# Patient Record
Sex: Female | Born: 1955 | Race: Black or African American | Hispanic: No | Smoking: Former smoker
Health system: Southern US, Community
[De-identification: ages and names within clinical notes are randomized; demographics above are authoritative.]

## PROBLEM LIST (undated history)

## (undated) DIAGNOSIS — H269 Unspecified cataract: Secondary | ICD-10-CM

## (undated) DIAGNOSIS — R011 Cardiac murmur, unspecified: Secondary | ICD-10-CM

## (undated) DIAGNOSIS — F419 Anxiety disorder, unspecified: Secondary | ICD-10-CM

## (undated) DIAGNOSIS — M1990 Unspecified osteoarthritis, unspecified site: Secondary | ICD-10-CM

## (undated) DIAGNOSIS — I10 Essential (primary) hypertension: Secondary | ICD-10-CM

## (undated) DIAGNOSIS — E119 Type 2 diabetes mellitus without complications: Secondary | ICD-10-CM

## (undated) DIAGNOSIS — D649 Anemia, unspecified: Secondary | ICD-10-CM

## (undated) DIAGNOSIS — I509 Heart failure, unspecified: Secondary | ICD-10-CM

## (undated) DIAGNOSIS — J189 Pneumonia, unspecified organism: Secondary | ICD-10-CM

## (undated) DIAGNOSIS — C50919 Malignant neoplasm of unspecified site of unspecified female breast: Secondary | ICD-10-CM

## (undated) HISTORY — DX: Anemia, unspecified: D64.9

## (undated) HISTORY — DX: Unspecified cataract: H26.9

## (undated) HISTORY — DX: Cardiac murmur, unspecified: R01.1

## (undated) HISTORY — DX: Anxiety disorder, unspecified: F41.9

## (undated) HISTORY — DX: Heart failure, unspecified: I50.9

## (undated) HISTORY — DX: Unspecified osteoarthritis, unspecified site: M19.90

---

## 2011-12-22 DIAGNOSIS — M25561 Pain in right knee: Secondary | ICD-10-CM | POA: Insufficient documentation

## 2011-12-22 DIAGNOSIS — J029 Acute pharyngitis, unspecified: Secondary | ICD-10-CM | POA: Insufficient documentation

## 2011-12-22 DIAGNOSIS — H6980 Other specified disorders of Eustachian tube, unspecified ear: Secondary | ICD-10-CM | POA: Insufficient documentation

## 2011-12-22 DIAGNOSIS — R42 Dizziness and giddiness: Secondary | ICD-10-CM | POA: Insufficient documentation

## 2011-12-22 DIAGNOSIS — H9209 Otalgia, unspecified ear: Secondary | ICD-10-CM | POA: Insufficient documentation

## 2011-12-22 DIAGNOSIS — J329 Chronic sinusitis, unspecified: Secondary | ICD-10-CM | POA: Insufficient documentation

## 2011-12-22 DIAGNOSIS — H6990 Unspecified Eustachian tube disorder, unspecified ear: Secondary | ICD-10-CM | POA: Insufficient documentation

## 2018-01-28 DIAGNOSIS — M1990 Unspecified osteoarthritis, unspecified site: Secondary | ICD-10-CM | POA: Insufficient documentation

## 2018-01-28 DIAGNOSIS — I10 Essential (primary) hypertension: Secondary | ICD-10-CM | POA: Insufficient documentation

## 2018-03-25 DIAGNOSIS — M1710 Unilateral primary osteoarthritis, unspecified knee: Secondary | ICD-10-CM | POA: Insufficient documentation

## 2018-03-25 DIAGNOSIS — M179 Osteoarthritis of knee, unspecified: Secondary | ICD-10-CM | POA: Insufficient documentation

## 2018-03-25 DIAGNOSIS — R262 Difficulty in walking, not elsewhere classified: Secondary | ICD-10-CM | POA: Insufficient documentation

## 2018-07-18 DIAGNOSIS — D369 Benign neoplasm, unspecified site: Secondary | ICD-10-CM | POA: Insufficient documentation

## 2020-01-30 DIAGNOSIS — R5381 Other malaise: Secondary | ICD-10-CM | POA: Insufficient documentation

## 2020-02-29 DIAGNOSIS — D539 Nutritional anemia, unspecified: Secondary | ICD-10-CM | POA: Insufficient documentation

## 2020-03-12 DIAGNOSIS — R590 Localized enlarged lymph nodes: Secondary | ICD-10-CM | POA: Insufficient documentation

## 2020-03-14 DIAGNOSIS — A419 Sepsis, unspecified organism: Secondary | ICD-10-CM | POA: Insufficient documentation

## 2020-03-18 DIAGNOSIS — Z171 Estrogen receptor negative status [ER-]: Secondary | ICD-10-CM | POA: Insufficient documentation

## 2020-03-18 DIAGNOSIS — C50411 Malignant neoplasm of upper-outer quadrant of right female breast: Secondary | ICD-10-CM | POA: Insufficient documentation

## 2020-04-03 DIAGNOSIS — C50919 Malignant neoplasm of unspecified site of unspecified female breast: Secondary | ICD-10-CM | POA: Insufficient documentation

## 2020-07-19 DIAGNOSIS — J189 Pneumonia, unspecified organism: Secondary | ICD-10-CM | POA: Insufficient documentation

## 2020-07-31 DIAGNOSIS — D649 Anemia, unspecified: Secondary | ICD-10-CM | POA: Insufficient documentation

## 2020-07-31 DIAGNOSIS — E119 Type 2 diabetes mellitus without complications: Secondary | ICD-10-CM | POA: Insufficient documentation

## 2020-07-31 DIAGNOSIS — D638 Anemia in other chronic diseases classified elsewhere: Secondary | ICD-10-CM | POA: Insufficient documentation

## 2020-07-31 DIAGNOSIS — K219 Gastro-esophageal reflux disease without esophagitis: Secondary | ICD-10-CM | POA: Insufficient documentation

## 2020-08-08 DIAGNOSIS — L98491 Non-pressure chronic ulcer of skin of other sites limited to breakdown of skin: Secondary | ICD-10-CM | POA: Insufficient documentation

## 2020-08-16 DIAGNOSIS — E43 Unspecified severe protein-calorie malnutrition: Secondary | ICD-10-CM | POA: Insufficient documentation

## 2020-09-16 ENCOUNTER — Emergency Department (HOSPITAL_COMMUNITY)
Admission: EM | Admit: 2020-09-16 | Discharge: 2020-09-17 | Disposition: A | Discharge status: Home / Self Care | Payer: Self-pay | Attending: Emergency Medicine | Admitting: Emergency Medicine

## 2020-09-16 ENCOUNTER — Other Ambulatory Visit: Payer: Self-pay

## 2020-09-16 ENCOUNTER — Emergency Department (HOSPITAL_COMMUNITY): Payer: Self-pay

## 2020-09-16 ENCOUNTER — Encounter (HOSPITAL_COMMUNITY): Payer: Self-pay

## 2020-09-16 DIAGNOSIS — Z79899 Other long term (current) drug therapy: Secondary | ICD-10-CM | POA: Insufficient documentation

## 2020-09-16 DIAGNOSIS — E119 Type 2 diabetes mellitus without complications: Secondary | ICD-10-CM | POA: Insufficient documentation

## 2020-09-16 DIAGNOSIS — R0602 Shortness of breath: Secondary | ICD-10-CM | POA: Insufficient documentation

## 2020-09-16 DIAGNOSIS — I10 Essential (primary) hypertension: Secondary | ICD-10-CM | POA: Insufficient documentation

## 2020-09-16 DIAGNOSIS — Z853 Personal history of malignant neoplasm of breast: Secondary | ICD-10-CM | POA: Insufficient documentation

## 2020-09-16 LAB — CBC WITH DIFFERENTIAL/PLATELET
Abs Immature Granulocytes: 0.12 10*3/uL — ABNORMAL HIGH (ref 0.00–0.07)
Basophils Absolute: 0 10*3/uL (ref 0.0–0.1)
Basophils Relative: 0 %
Eosinophils Absolute: 0 10*3/uL (ref 0.0–0.5)
Eosinophils Relative: 0 %
HCT: 35.7 % — ABNORMAL LOW (ref 36.0–46.0)
Hemoglobin: 10.9 g/dL — ABNORMAL LOW (ref 12.0–15.0)
Immature Granulocytes: 2 %
Lymphocytes Relative: 30 %
Lymphs Abs: 1.6 10*3/uL (ref 0.7–4.0)
MCH: 29.7 pg (ref 26.0–34.0)
MCHC: 30.5 g/dL (ref 30.0–36.0)
MCV: 97.3 fL (ref 80.0–100.0)
Monocytes Absolute: 0.5 10*3/uL (ref 0.1–1.0)
Monocytes Relative: 8 %
Neutro Abs: 3.2 10*3/uL (ref 1.7–7.7)
Neutrophils Relative %: 60 %
Platelets: 243 10*3/uL (ref 150–400)
RBC: 3.67 MIL/uL — ABNORMAL LOW (ref 3.87–5.11)
RDW: 17.2 % — ABNORMAL HIGH (ref 11.5–15.5)
WBC: 5.4 10*3/uL (ref 4.0–10.5)
nRBC: 0 % (ref 0.0–0.2)

## 2020-09-16 LAB — COMPREHENSIVE METABOLIC PANEL
ALT: 59 U/L — ABNORMAL HIGH (ref 0–44)
AST: 26 U/L (ref 15–41)
Albumin: 3.3 g/dL — ABNORMAL LOW (ref 3.5–5.0)
Alkaline Phosphatase: 94 U/L (ref 38–126)
Anion gap: 10 (ref 5–15)
BUN: 7 mg/dL — ABNORMAL LOW (ref 8–23)
CO2: 29 mmol/L (ref 22–32)
Calcium: 8.9 mg/dL (ref 8.9–10.3)
Chloride: 102 mmol/L (ref 98–111)
Creatinine, Ser: 0.6 mg/dL (ref 0.44–1.00)
GFR, Estimated: >60 mL/min (ref >60)
Glucose, Bld: 192 mg/dL — ABNORMAL HIGH (ref 70–99)
Potassium: 3 mmol/L — ABNORMAL LOW (ref 3.5–5.1)
Sodium: 141 mmol/L (ref 135–145)
Total Bilirubin: 0.6 mg/dL (ref 0.3–1.2)
Total Protein: 6.4 g/dL — ABNORMAL LOW (ref 6.5–8.1)

## 2020-09-16 LAB — TROPONIN I (HIGH SENSITIVITY): Troponin I (High Sensitivity): 10 ng/L (ref <18)

## 2020-09-16 LAB — BRAIN NATRIURETIC PEPTIDE: B Natriuretic Peptide: 11.4 pg/mL (ref 0.0–100.0)

## 2020-09-16 IMAGING — DX DG CHEST 2V
2 series · 2 of 2 positions shown · non-contrast
Comparison: None.

CLINICAL DATA: Dyspnea, lightheaded, hypertension

EXAM:
CHEST - 2 VIEW

[chest lat]
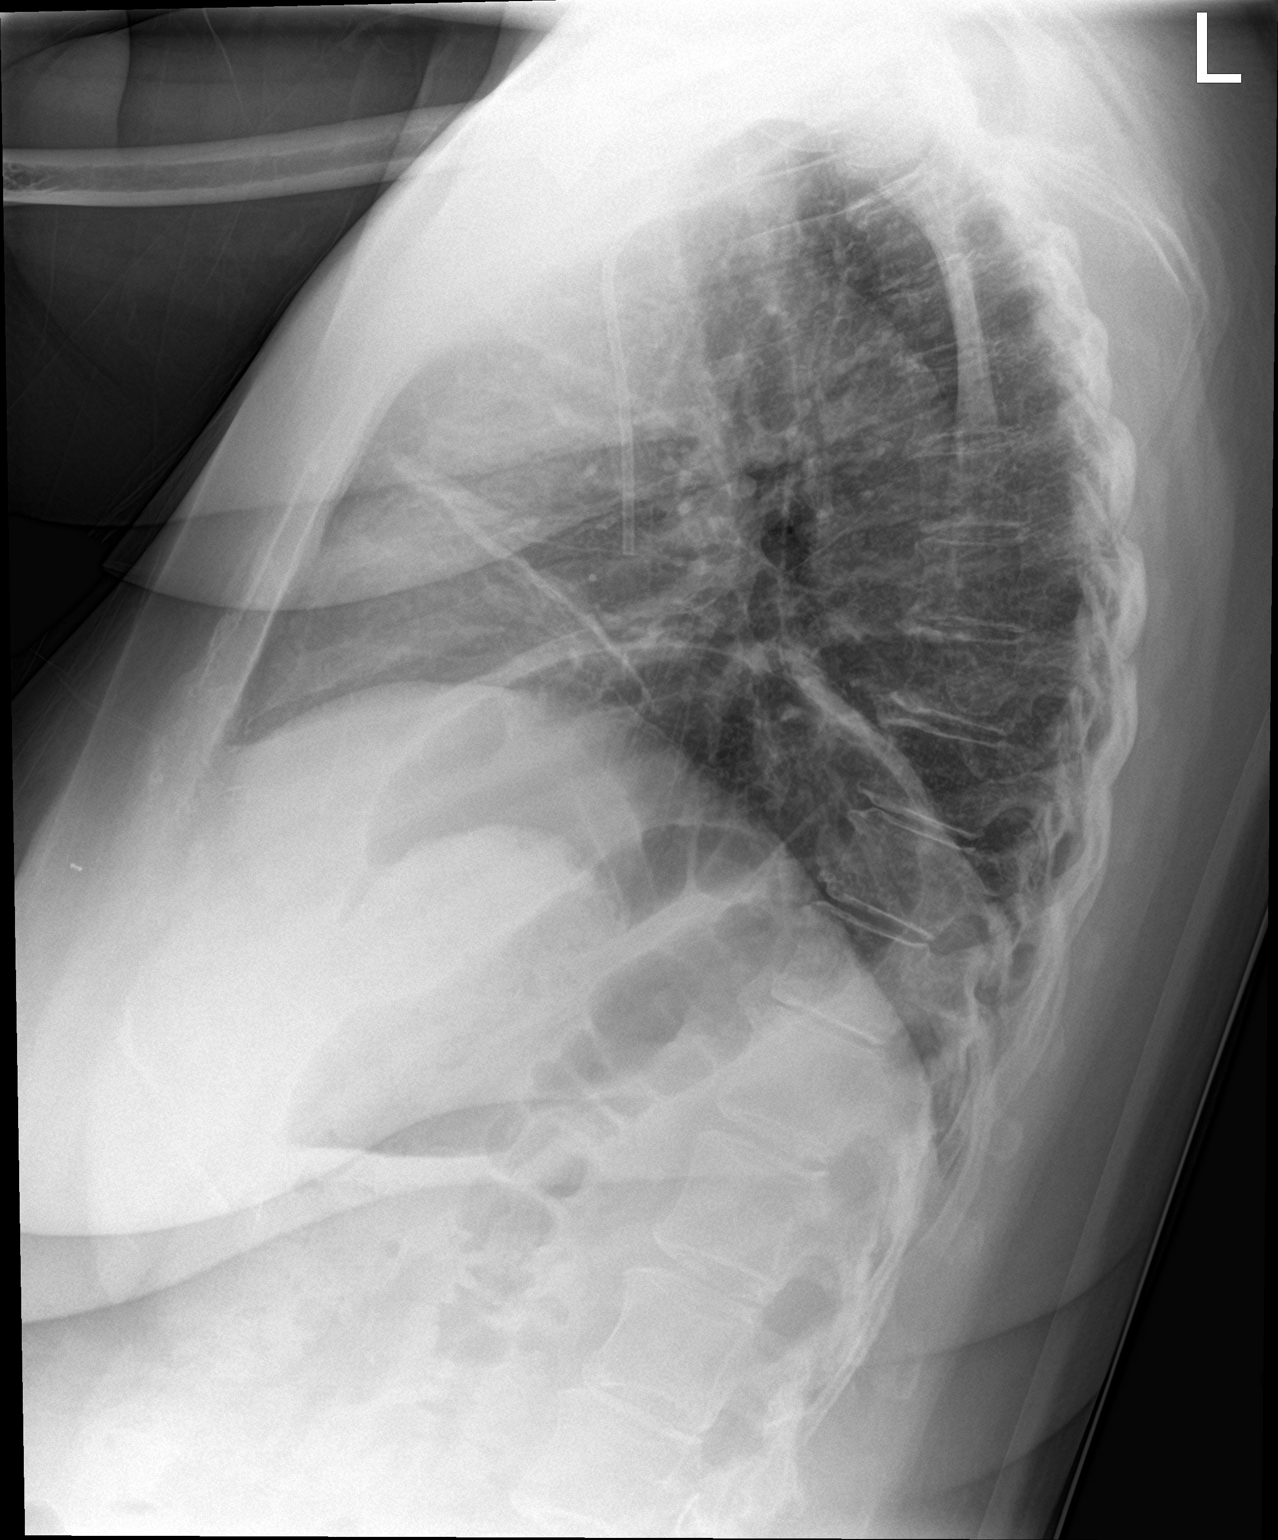

[chest ap]
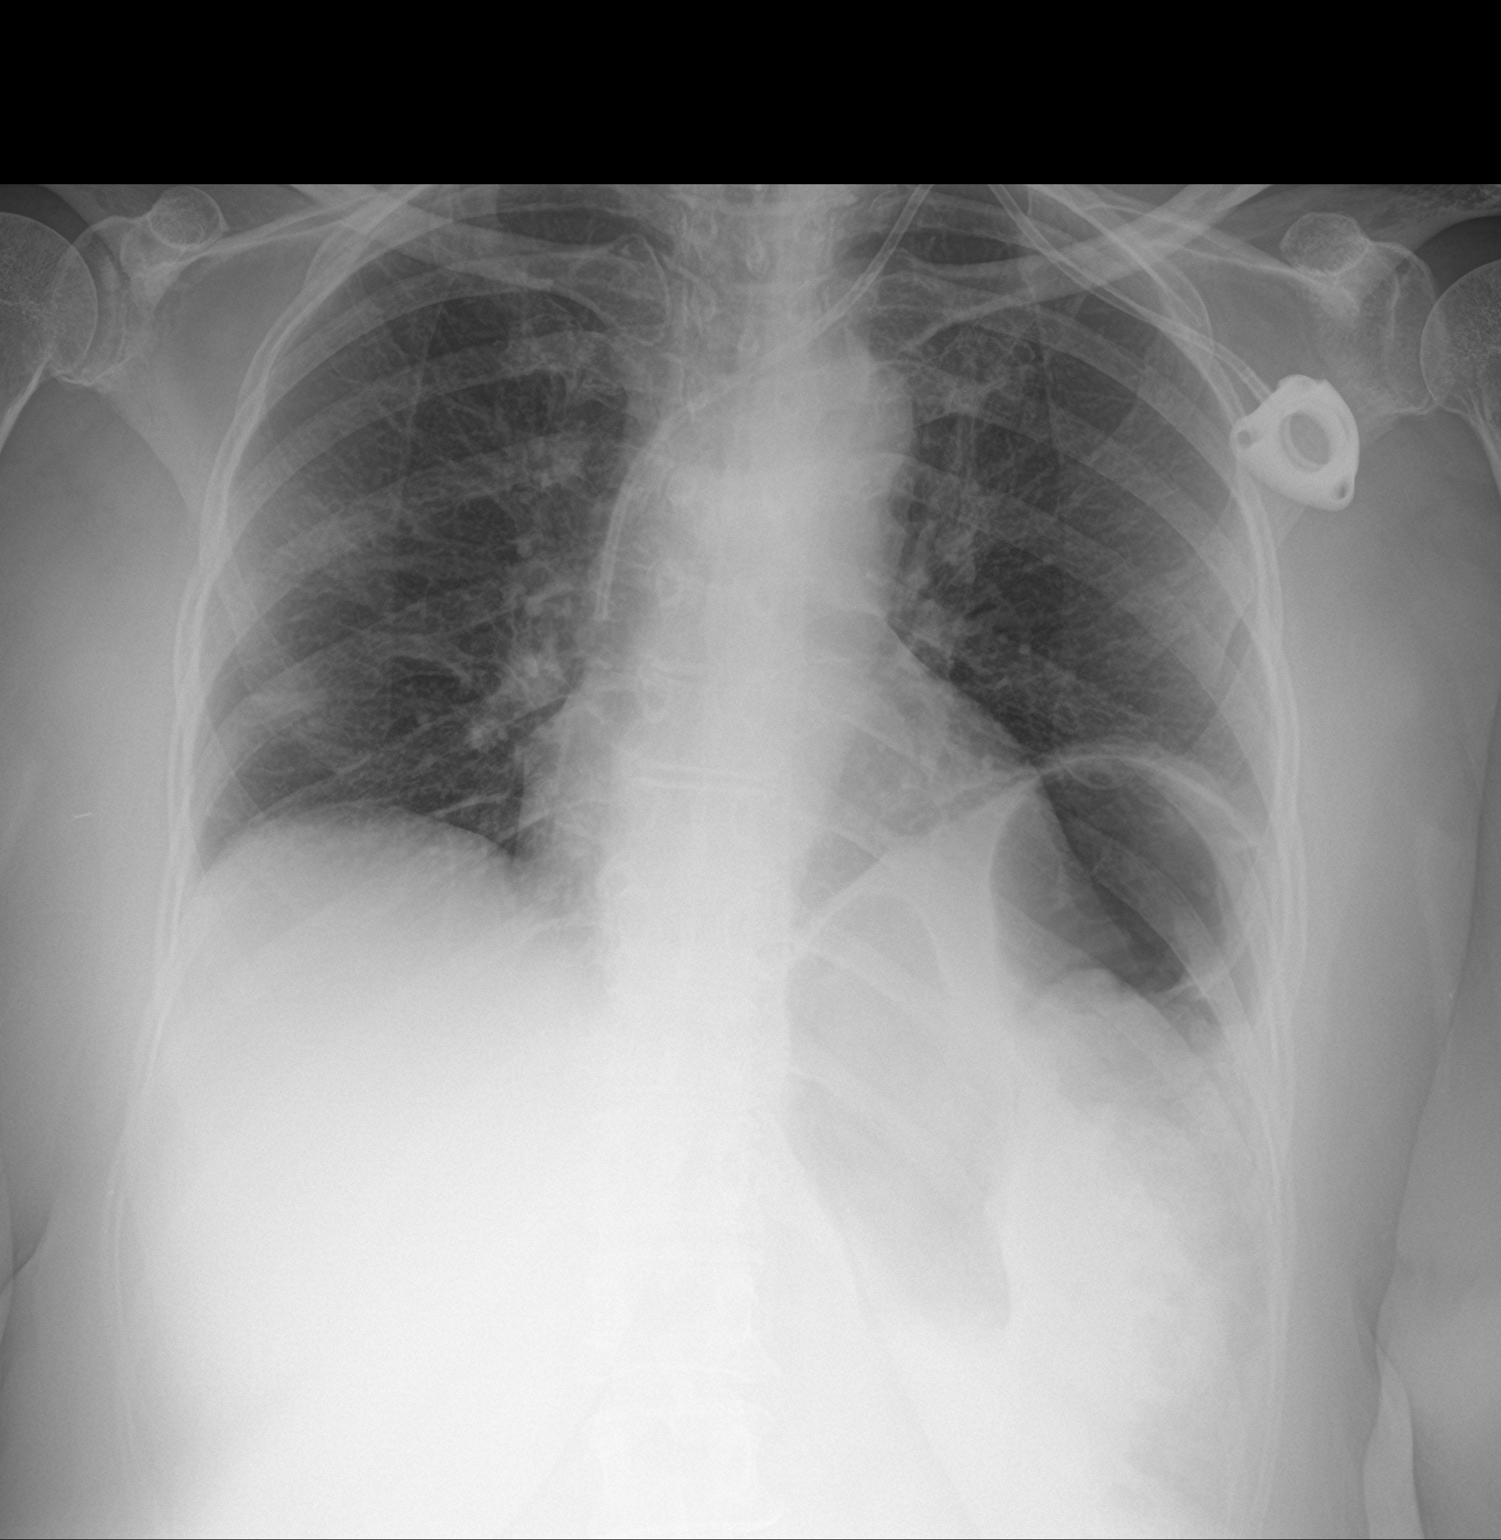

[2 of 2 positions shown; findings below may reference images not displayed]

FINDINGS: Left internal jugular Port-A-Cath terminates in the lower third of
the SVC. Normal heart size. Normal mediastinal contour. No
pneumothorax. No pleural effusion. Platelike mild scarring versus
atelectasis at the anterior lung bases on the lateral view. No acute
consolidative airspace disease. No pulmonary edema.
IMPRESSION: Platelike mild scarring versus atelectasis at the anterior lung
bases on the lateral view. Otherwise no active cardiopulmonary
disease.

## 2020-09-16 NOTE — ED Provider Notes (Signed)
Emergency Medicine Provider Triage Evaluation Note  Marie Church 65 y.o. female was evaluated in triage.  Pt complains of hypertension, shortness of breath.  She reports that her blood pressure has been elevated "for a while now" she states that she has noticed for several days, her blood pressure has been in the 180s.  She also reports that for the last few weeks, she has had some shortness of breath.  She denies any chest pain, swelling in her legs, fevers, numbness/weakness of her arms or legs, nausea/vomiting.  She is on blood pressure medication and states she has been taking her meds.  Review of Systems  Positive: Hypertension, shortness of breath Negative: Chest pain, leg swelling, numbness/weakness of arms or legs.  Physical Exam  BP 134/82   Pulse 70   Temp 98.2 F (36.8 C) (Oral)   Resp 18   Ht 5\' 4"  (1.626 m)   Wt 65.8 kg   SpO2 100%   BMI 24.89 kg/m  Gen:   Awake, no distress   HEENT:  Atraumatic  Resp:  Normal effort. Lungts CTAB. No evidence of respiratory distress.  Cardiac:  Normal rate  Abd:   Nondistended, nontender  MSK:   Moves extremities without difficulty. 5/5 strength BUE and BLE.  Neuro:  Speech clear   Medical Decision Making  Medically screening exam initiated at 3:55 AM.  Appropriate orders placed.  Marie Church was informed that the remainder of the evaluation will be completed by another provider, this initial triage assessment does not replace that evaluation, and the importance of remaining in the ED until their evaluation is complete.  Clinical Impression  SOB   Portions of this note were generated with Dragon dictation software. Dictation errors may occur despite best attempts at proofreading.      Volanda Napoleon, PA-C 09/16/20 2112    Lorelle Gibbs, DO 09/17/20 0008

## 2020-09-16 NOTE — ED Triage Notes (Signed)
Pt c/o her blood pressure being elevated for past three weeks. Pt states she feels lightheaded. Pt c/o pain in back.

## 2020-09-17 ENCOUNTER — Emergency Department (HOSPITAL_COMMUNITY): Payer: Self-pay

## 2020-09-17 ENCOUNTER — Encounter (HOSPITAL_COMMUNITY): Payer: Self-pay | Admitting: Emergency Medicine

## 2020-09-17 LAB — TROPONIN I (HIGH SENSITIVITY): Troponin I (High Sensitivity): 8 ng/L (ref <18)

## 2020-09-17 IMAGING — CT CT ANGIO CHEST
2 of 6 series · 19 of 36 positions shown · IV contrast (omnipaque)
Comparison: None.

CLINICAL DATA: Hypertension and shortness of breath.

EXAM:
CT ANGIOGRAPHY CHEST WITH CONTRAST
TECHNIQUE: Multidetector CT imaging of the chest was performed using the
standard protocol during bolus administration of intravenous
contrast. Multiplanar CT image reconstructions and MIPs were
obtained to evaluate the vascular anatomy.
CONTRAST:  100mL OMNIPAQUE IOHEXOL 350 MG/ML SOLN

[Series 8: pe thins · axial · 0.71mm/px · z∈[+1386,+1601]mm · 18 of 343 slices shown]
[im 18/343  lung]
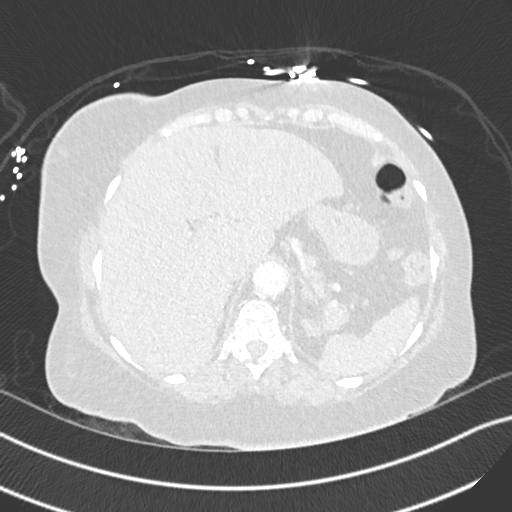
[im 35/343  mediastinal]
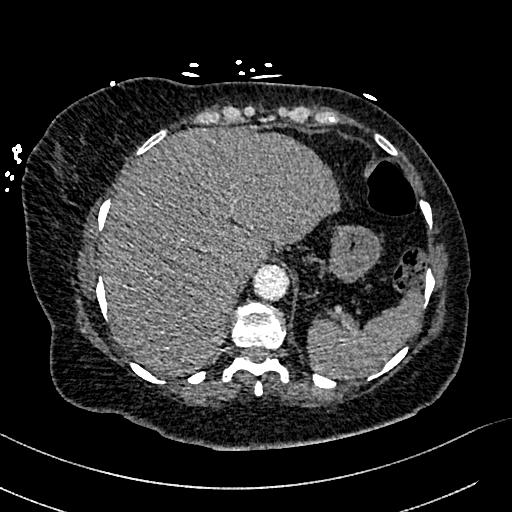
[im 52/343  lung]
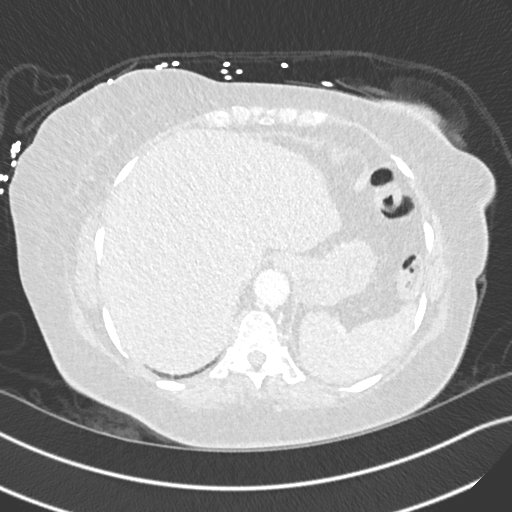
[im 69/343  mediastinal]
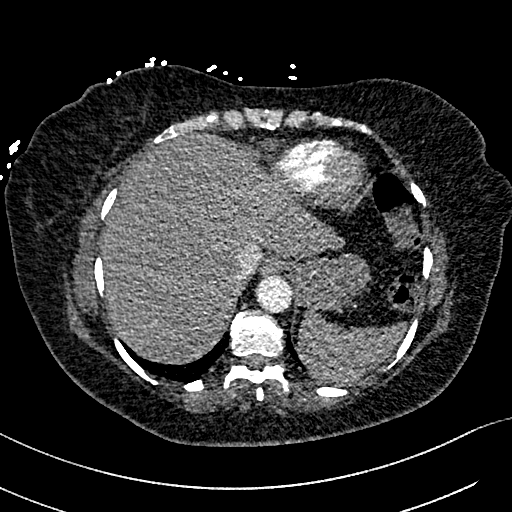
[im 86/343  lung]
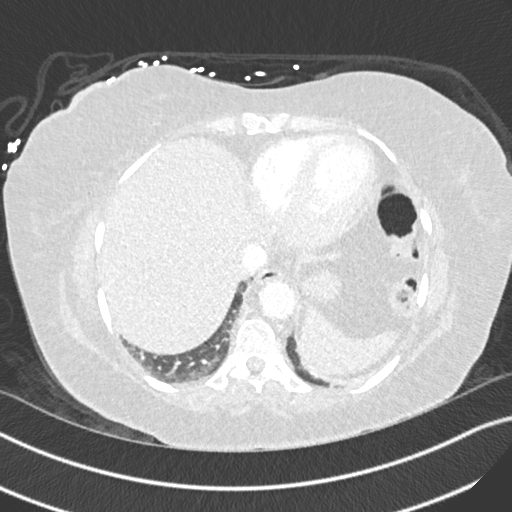
[im 103/343  mediastinal]
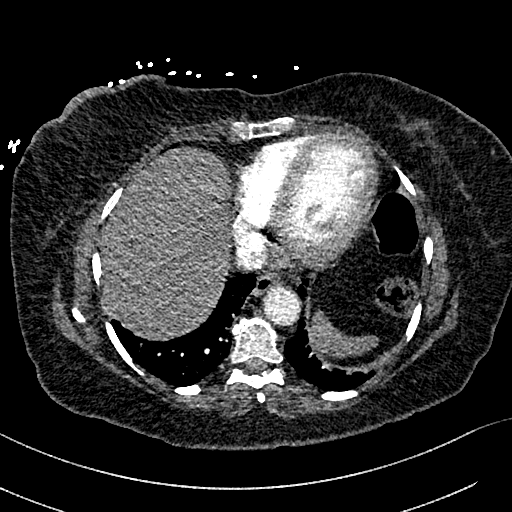
[im 120/343  lung]
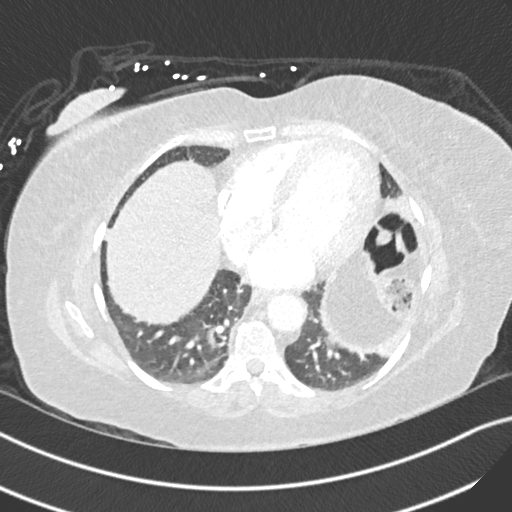
[im 137/343  mediastinal]
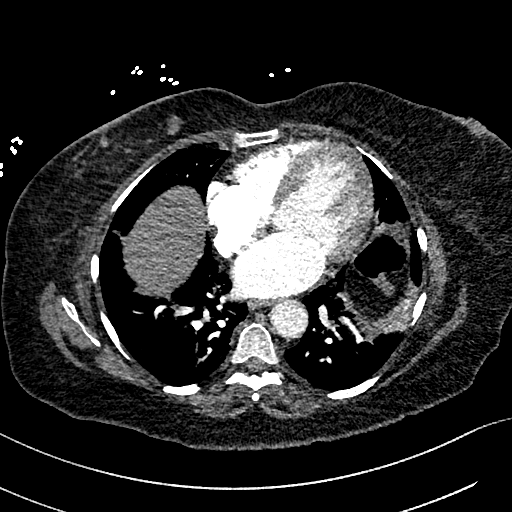
[im 154/343  lung]
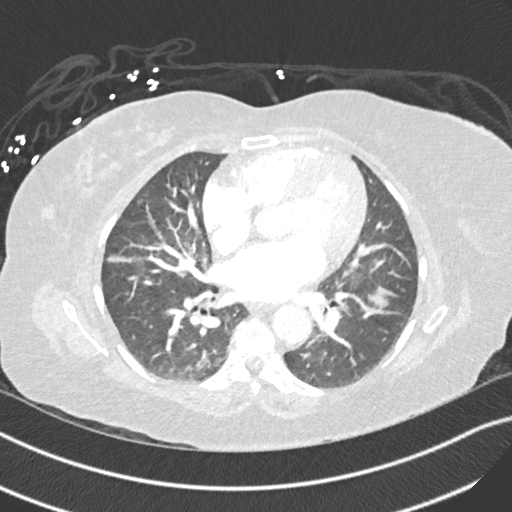
[im 189/343  mediastinal]
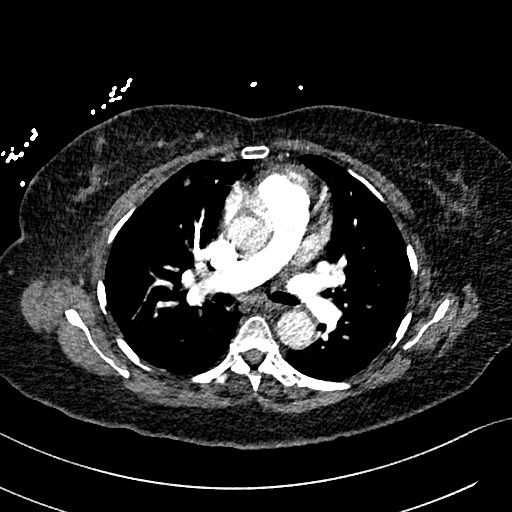
[im 206/343  lung]
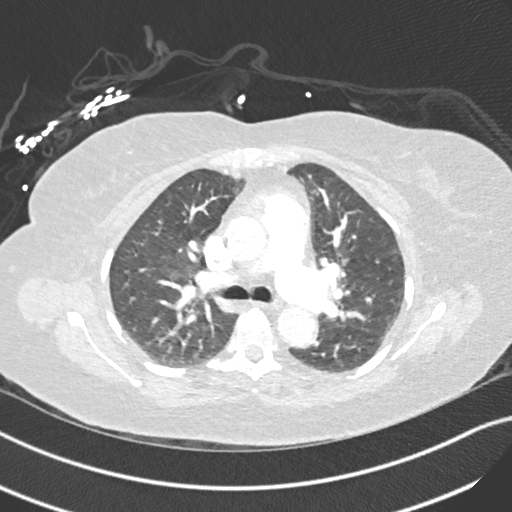
[im 223/343  mediastinal]
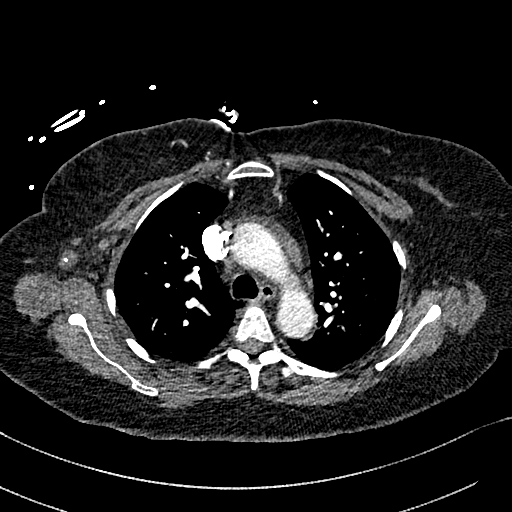
[im 240/343  lung]
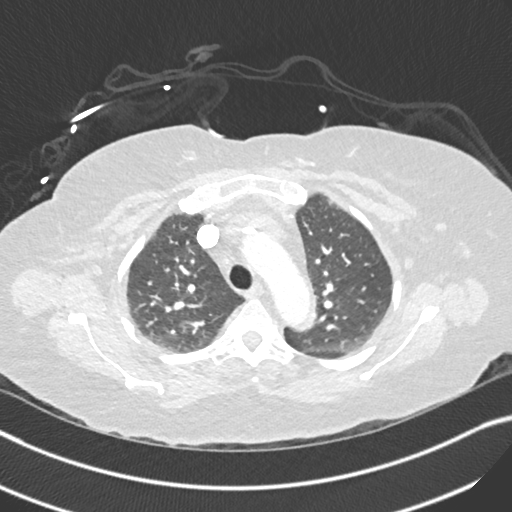
[im 257/343  mediastinal]
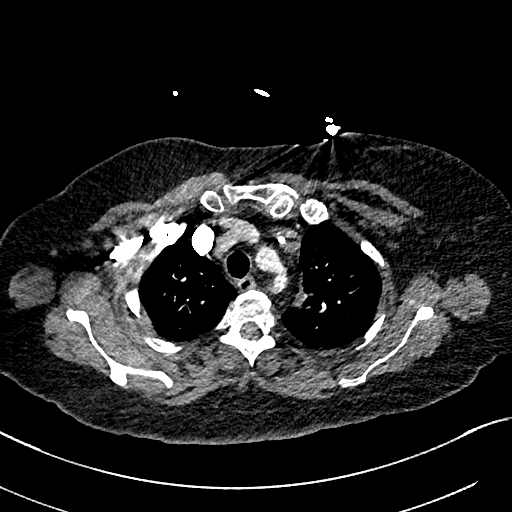
[im 274/343  lung]
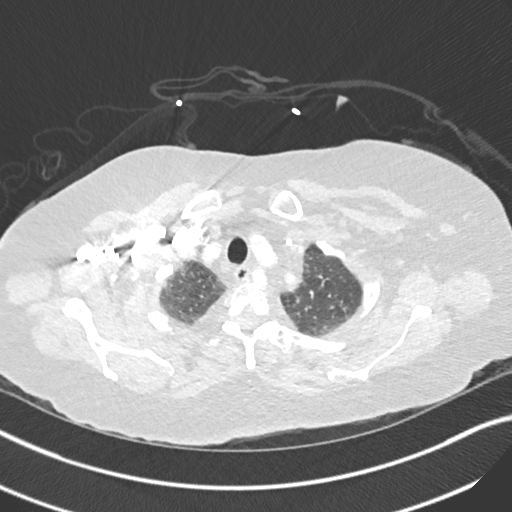
[im 291/343  mediastinal]
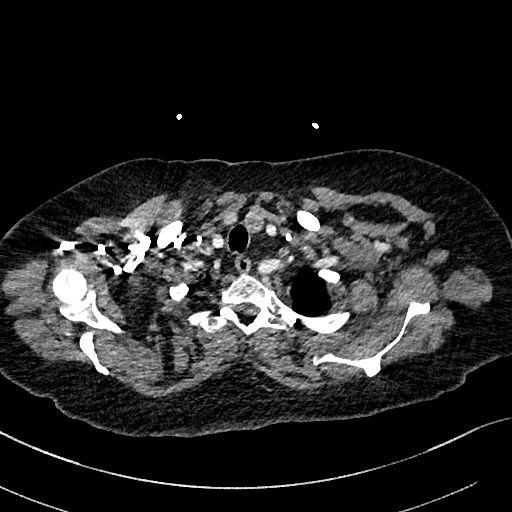
[im 308/343  lung]
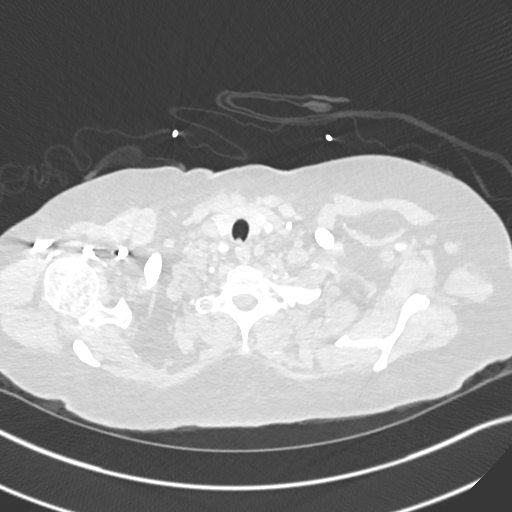
[im 325/343  mediastinal]
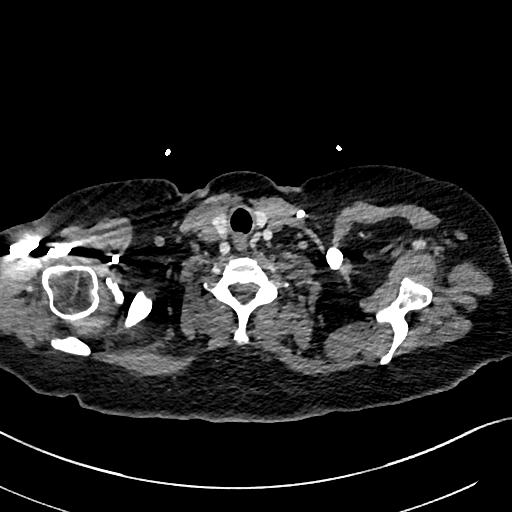

[Series 9: pe 2mm cor · coronal · 0.49mm/px · 1 of 122 slices shown]
[im 61/122  mediastinal]
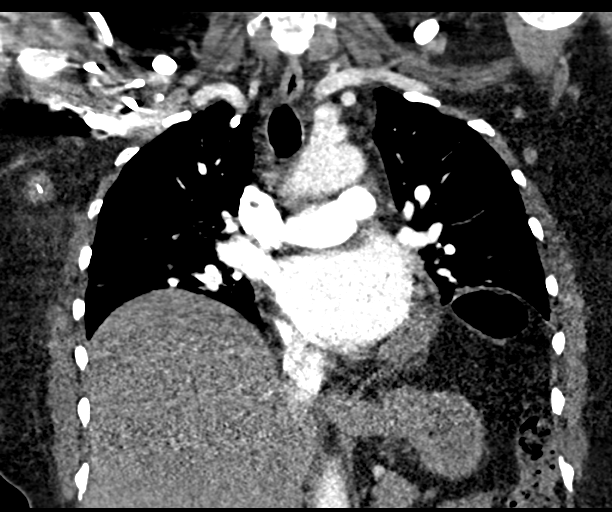

[19 of 36 positions shown; findings below may reference images not displayed]

FINDINGS: Cardiovascular: A left-sided venous Port-A-Cath is seen. The
thoracic aorta is normal in caliber without evidence of aneurysmal
dilatation. Satisfactory opacification of the pulmonary arteries to
the segmental level. No evidence of pulmonary embolism. The left
pulmonary artery is prominent and measures 2.6 cm in diameter.
Normal heart size. No pericardial effusion.

Mediastinum/Nodes: No enlarged mediastinal, hilar, or axillary lymph
nodes. Thyroid gland, trachea, and esophagus demonstrate no
significant findings.

Lungs/Pleura: Mild areas of atelectasis are seen within the right
upper lobe and left lung base.

There is no evidence of a pleural effusion or pneumothorax.

Upper Abdomen: No acute abnormality.

Musculoskeletal: No chest wall abnormality. No acute or significant
osseous findings.

Review of the MIP images confirms the above findings.
IMPRESSION: 1. No evidence of pulmonary embolism.
2. Mild right upper lobe and left basilar atelectasis.

## 2020-09-17 MED ORDER — AMLODIPINE BESYLATE 5 MG PO TABS
5.0000 mg | ORAL_TABLET | Freq: Once | ORAL | Status: AC
  Administered 2020-09-17: 5 mg via ORAL
  Filled 2020-09-17: qty 1

## 2020-09-17 MED ORDER — POTASSIUM CHLORIDE CRYS ER 20 MEQ PO TBCR
40.0000 meq | EXTENDED_RELEASE_TABLET | Freq: Once | ORAL | Status: AC
  Administered 2020-09-17: 40 meq via ORAL
  Filled 2020-09-17: qty 2

## 2020-09-17 MED ORDER — KETOROLAC TROMETHAMINE 30 MG/ML IJ SOLN
30.0000 mg | Freq: Once | INTRAMUSCULAR | Status: AC
  Administered 2020-09-17: 30 mg via INTRAVENOUS
  Filled 2020-09-17: qty 1

## 2020-09-17 MED ORDER — LIDOCAINE 5 % EX PTCH
1.0000 | MEDICATED_PATCH | CUTANEOUS | 0 refills | Status: DC
Start: 2020-09-17 — End: 2020-10-03

## 2020-09-17 MED ORDER — IOHEXOL 350 MG/ML SOLN
100.0000 mL | Freq: Once | INTRAVENOUS | Status: AC | PRN
  Administered 2020-09-17: 100 mL via INTRAVENOUS

## 2020-09-17 NOTE — ED Provider Notes (Signed)
[facility] EMERGENCY DEPARTMENT Provider Note   CSN: [id number] Arrival date & time: 09/16/20  2056     History Chief Complaint  Patient presents with  . Shortness of Breath  . Hypertension    Marie Church is a 65 y.o. female.  The history is provided by the patient.  Shortness of Breath Severity:  Moderate Onset quality:  Gradual Duration:  1 week Timing:  Constant Progression:  Unchanged Chronicity:  Recurrent Context: not strong odors and not URI   Relieved by:  Nothing Worsened by:  Nothing Ineffective treatments:  None tried Associated symptoms: no abdominal pain, no chest pain, no claudication, no cough, no diaphoresis, no ear pain, no fever, no headaches, no hemoptysis, no neck pain, no PND, no rash, no sore throat, no sputum production, no syncope, no swollen glands, no vomiting and no wheezing   Hypertension Associated symptoms include shortness of breath. Pertinent negatives include no chest pain, no abdominal pain and no headaches.  Patient with HTN presents with ongoing SOB.  No CP, no n/v/d.       History reviewed. No pertinent past medical history.  Patient Active Problem List   Diagnosis Date Noted  . Severe protein-calorie malnutrition ([place]) 08/16/2020  . Ulcer of right groin, limited to breakdown of skin ([place]) 08/08/2020  . Anemia of chronic disease 07/31/2020  . GERD without esophagitis 07/31/2020  . Type 2 diabetes mellitus without complication, without long-term current use of insulin ([place]) 07/31/2020  . Bilateral pneumonia 07/19/2020  . Breast cancer ([place]) 04/03/2020  . Malignant neoplasm of upper-outer quadrant of right breast in female, estrogen receptor negative ([place]) 03/18/2020  . Sepsis ([place]) 03/14/2020  . Axillary adenopathy 03/12/2020  . Macrocytic anemia 02/29/2020  . Physical debility 01/30/2020  . 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 Difficulty in walking 03/25/2018  . Osteoarthritis of knee 03/25/2018  .  Arthritis 01/28/2018  . Essential hypertension 01/28/2018  . Bilateral knee pain 12/22/2011  . Dizziness 12/22/2011  . Eustachian tube dysfunction 12/22/2011  . Otalgia 12/22/2011  . Sinusitis 12/22/2011  . Sore throat 12/22/2011    History reviewed. No pertinent surgical history.   OB History   No obstetric history on file.     History reviewed. No pertinent family history.  Social History   Tobacco Use  . Smoking status: Never Smoker  . Smokeless tobacco: Never Used  Vaping Use  . Vaping Use: Never used  Substance Use Topics  . Alcohol use: Never  . Drug use: Never    Home Medications Prior to Admission medications   Medication Sig Start Date End Date Taking? Authorizing Provider  losartan (COZAAR) 25 MG tablet Take 25 mg by mouth daily. 09/01/20  Yes [provider]    Allergies    Doxycycline, Amoxicillin-pot clavulanate, and Lisinopril  Review of Systems   Review of Systems  Constitutional: Negative for diaphoresis and fever.  HENT: Negative for ear pain and sore throat.   Respiratory: Positive for shortness of breath. Negative for cough, hemoptysis, sputum production and wheezing.   Cardiovascular: Negative for chest pain, claudication, syncope and PND.  Gastrointestinal: Negative for abdominal pain and vomiting.  Genitourinary: Negative for difficulty urinating.  Musculoskeletal: Negative for neck pain.  Skin: Negative for rash.  Neurological: Negative for dizziness, facial asymmetry and headaches.  Psychiatric/Behavioral: Negative for agitation.  All other systems reviewed and are negative.   Physical Exam Updated Vital Signs BP (!) 179/106   Pulse 97   Temp 98.1 F (36.7 C) (  Oral)   Resp (!) 25   SpO2 99%   Physical Exam Vitals and nursing note reviewed.  Constitutional:      General: She is not in acute distress.    Appearance: Normal appearance.  HENT:     Head: Normocephalic and atraumatic.     Nose: Nose normal.  Eyes:      Conjunctiva/sclera: Conjunctivae normal.     Pupils: Pupils are equal, round, and reactive to light.  Cardiovascular:     Rate and Rhythm: Normal rate and regular rhythm.     Pulses: Normal pulses.     Heart sounds: Normal heart sounds.  Pulmonary:     Effort: Pulmonary effort is normal.     Breath sounds: Normal breath sounds.  Abdominal:     General: Abdomen is flat. Bowel sounds are normal.     Palpations: Abdomen is soft.     Tenderness: There is no abdominal tenderness. There is no guarding.  Musculoskeletal:        General: Normal range of motion.     Cervical back: Normal range of motion and neck supple. No rigidity.  Skin:    General: Skin is warm and dry.     Capillary Refill: Capillary refill takes less than 2 seconds.  Neurological:     General: No focal deficit present.     Mental Status: She is alert and oriented to person, place, and time.     Deep Tendon Reflexes: Reflexes normal.  Psychiatric:        Mood and Affect: Mood normal.        Behavior: Behavior normal.     ED Results / Procedures / Treatments   Labs (all labs ordered are listed, but only abnormal results are displayed) Results for orders placed or performed during the hospital encounter of 09/16/20  Comprehensive metabolic panel  Result Value Ref Range   Sodium 141 135 - 145 mmol/L   Potassium 3.0 (L) 3.5 - 5.1 mmol/L   Chloride 102 98 - 111 mmol/L   CO2 29 22 - 32 mmol/L   Glucose, Bld 192 (H) 70 - 99 mg/dL   BUN 7 (L) 8 - 23 mg/dL   Creatinine, Ser 0.60 0.44 - 1.00 mg/dL   Calcium 8.9 8.9 - 10.3 mg/dL   Total Protein 6.4 (L) 6.5 - 8.1 g/dL   Albumin 3.3 (L) 3.5 - 5.0 g/dL   AST 26 15 - 41 U/L   ALT 59 (H) 0 - 44 U/L   Alkaline Phosphatase 94 38 - 126 U/L   Total Bilirubin 0.6 0.3 - 1.2 mg/dL   GFR, Estimated >60 >60 mL/min   Anion gap 10 5 - 15  CBC with Differential  Result Value Ref Range   WBC 5.4 4.0 - 10.5 K/uL   RBC 3.67 (L) 3.87 - 5.11 MIL/uL   Hemoglobin 10.9 (L) 12.0 - 15.0  g/dL   HCT 35.7 (L) 36.0 - 46.0 %   MCV 97.3 80.0 - 100.0 fL   MCH 29.7 26.0 - 34.0 pg   MCHC 30.5 30.0 - 36.0 g/dL   RDW 17.2 (H) 11.5 - 15.5 %   Platelets 243 150 - 400 K/uL   nRBC 0.0 0.0 - 0.2 %   Neutrophils Relative % 60 %   Neutro Abs 3.2 1.7 - 7.7 K/uL   Lymphocytes Relative 30 %   Lymphs Abs 1.6 0.7 - 4.0 K/uL   Monocytes Relative 8 %   Monocytes Absolute 0.5 0.1 - 1.0  K/uL   Eosinophils Relative 0 %   Eosinophils Absolute 0.0 0.0 - 0.5 K/uL   Basophils Relative 0 %   Basophils Absolute 0.0 0.0 - 0.1 K/uL   Immature Granulocytes 2 %   Abs Immature Granulocytes 0.12 (H) 0.00 - 0.07 K/uL  Brain natriuretic peptide  Result Value Ref Range   B Natriuretic Peptide 11.4 0.0 - 100.0 pg/mL  Troponin I (High Sensitivity)  Result Value Ref Range   Troponin I (High Sensitivity) 10 <18 ng/L  Troponin I (High Sensitivity)  Result Value Ref Range   Troponin I (High Sensitivity) 8 <18 ng/L   DG Chest 2 View  Result Date: 09/16/2020 CLINICAL DATA:  Dyspnea, lightheaded, hypertension EXAM: CHEST - 2 VIEW COMPARISON:  None. FINDINGS: Left internal jugular Port-A-Cath terminates in the lower third of the SVC. Normal heart size. Normal mediastinal contour. No pneumothorax. No pleural effusion. Platelike mild scarring versus atelectasis at the anterior lung bases on the lateral view. No acute consolidative airspace disease. No pulmonary edema. IMPRESSION: Platelike mild scarring versus atelectasis at the anterior lung bases on the lateral view. Otherwise no active cardiopulmonary disease. Electronically Signed   By: Ilona Sorrel M.D.   On: 09/16/2020 21:48   CT Angio Chest PE W and/or Wo Contrast  Result Date: 09/17/2020 CLINICAL DATA:  Hypertension and shortness of breath. EXAM: CT ANGIOGRAPHY CHEST WITH CONTRAST TECHNIQUE: Multidetector CT imaging of the chest was performed using the standard protocol during bolus administration of intravenous contrast. Multiplanar CT image reconstructions  and MIPs were obtained to evaluate the vascular anatomy. CONTRAST:  17mL OMNIPAQUE IOHEXOL 350 MG/ML SOLN COMPARISON:  None. FINDINGS: Cardiovascular: A left-sided venous Port-A-Cath is seen. The thoracic aorta is normal in caliber without evidence of aneurysmal dilatation. Satisfactory opacification of the pulmonary arteries to the segmental level. No evidence of pulmonary embolism. The left pulmonary artery is prominent and measures 2.6 cm in diameter. Normal heart size. No pericardial effusion. Mediastinum/Nodes: No enlarged mediastinal, hilar, or axillary lymph nodes. Thyroid gland, trachea, and esophagus demonstrate no significant findings. Lungs/Pleura: Mild areas of atelectasis are seen within the right upper lobe and left lung base. There is no evidence of a pleural effusion or pneumothorax. Upper Abdomen: No acute abnormality. Musculoskeletal: No chest wall abnormality. No acute or significant osseous findings. Review of the MIP images confirms the above findings. IMPRESSION: 1. No evidence of pulmonary embolism. 2. Mild right upper lobe and left basilar atelectasis. Electronically Signed   By: Virgina Norfolk M.D.   On: 09/17/2020 00:58    EKG EKG Interpretation  Date/Time:  Monday Sep 16 2020 21:16:49 EDT Ventricular Rate:  111 PR Interval:  134 QRS Duration: 78 QT Interval:  342 QTC Calculation: 465 R Axis:   36 Text Interpretation: Sinus tachycardia Minimal voltage criteria for LVH, may be normal variant ( Sokolow-Lyon ) Confirmed by Dory Horn) on 09/16/2020 11:31:46 PM   Radiology DG Chest 2 View  Result Date: 09/16/2020 CLINICAL DATA:  Dyspnea, lightheaded, hypertension EXAM: CHEST - 2 VIEW COMPARISON:  None. FINDINGS: Left internal jugular Port-A-Cath terminates in the lower third of the SVC. Normal heart size. Normal mediastinal contour. No pneumothorax. No pleural effusion. Platelike mild scarring versus atelectasis at the anterior lung bases on the lateral view. No  acute consolidative airspace disease. No pulmonary edema. IMPRESSION: Platelike mild scarring versus atelectasis at the anterior lung bases on the lateral view. Otherwise no active cardiopulmonary disease. Electronically Signed   By: Ilona Sorrel M.D.   On:  09/16/2020 21:48   CT Angio Chest PE W and/or Wo Contrast  Result Date: 09/17/2020 CLINICAL DATA:  Hypertension and shortness of breath. EXAM: CT ANGIOGRAPHY CHEST WITH CONTRAST TECHNIQUE: Multidetector CT imaging of the chest was performed using the standard protocol during bolus administration of intravenous contrast. Multiplanar CT image reconstructions and MIPs were obtained to evaluate the vascular anatomy. CONTRAST:  133mL OMNIPAQUE IOHEXOL 350 MG/ML SOLN COMPARISON:  None. FINDINGS: Cardiovascular: A left-sided venous Port-A-Cath is seen. The thoracic aorta is normal in caliber without evidence of aneurysmal dilatation. Satisfactory opacification of the pulmonary arteries to the segmental level. No evidence of pulmonary embolism. The left pulmonary artery is prominent and measures 2.6 cm in diameter. Normal heart size. No pericardial effusion. Mediastinum/Nodes: No enlarged mediastinal, hilar, or axillary lymph nodes. Thyroid gland, trachea, and esophagus demonstrate no significant findings. Lungs/Pleura: Mild areas of atelectasis are seen within the right upper lobe and left lung base. There is no evidence of a pleural effusion or pneumothorax. Upper Abdomen: No acute abnormality. Musculoskeletal: No chest wall abnormality. No acute or significant osseous findings. Review of the MIP images confirms the above findings. IMPRESSION: 1. No evidence of pulmonary embolism. 2. Mild right upper lobe and left basilar atelectasis. Electronically Signed   By: Virgina Norfolk M.D.   On: 09/17/2020 00:58    Procedures Procedures   Medications Ordered in ED Medications  ketorolac (TORADOL) 30 MG/ML injection 30 mg (has no administration in time range)   iohexol (OMNIPAQUE) 350 MG/ML injection 100 mL (100 mLs Intravenous Contrast Given 09/17/20 0037)    ED Course  I have reviewed the triage vital signs and the nursing notes.  Pertinent labs & imaging results that were available during my care of the patient were reviewed by me and considered in my medical decision making (see chart for details).   Ruled out for MI and PE in the ED.  BP was treated.  Will have patient follow up with her PMD for BP medication adjustment.  No signs of infection.  I suspect som of this is due to deconditioning.    Marie Church was evaluated in Emergency Department on 09/17/2020 for the symptoms described in the history of present illness. She was evaluated in the context of the global COVID-19 pandemic, which necessitated consideration that the patient might be at risk for infection with the SARS-CoV-2 virus that causes COVID-19. Institutional protocols and algorithms that pertain to the evaluation of patients at risk for COVID-19 are in a state of rapid change based on information released by regulatory bodies including the CDC and federal and state organizations. These policies and algorithms were followed during the patient's care in the ED.  Final Clinical Impression(s) / ED Diagnoses Return for intractable cough, coughing up blood, fevers >100.4 unrelieved by medication, shortness of breath, intractable vomiting, chest pain, shortness of breath, weakness, numbness, changes in speech, facial asymmetry, abdominal pain, passing out, Inability to tolerate liquids or food, cough, altered mental status or any concerns. No signs of systemic illness or infection. The patient is nontoxic-appearing on exam and vital signs are within normal limits.  I have reviewed the triage vital signs and the nursing notes. Pertinent labs & imaging results that were available during my care of the patient were reviewed by me and considered in my medical decision making (see chart for  details). After history, exam, and medical workup I feel the patient has been appropriately medically screened and is safe for discharge home. Pertinent diagnoses were discussed with  the patient. Patient was given return precautions.   Bosten Newstrom, MD 09/17/20 [id number]

## 2020-09-17 NOTE — ED Notes (Signed)
Patient verbalizes understanding of discharge instructions. Opportunity for questioning and answers were provided. Armband removed by staff, pt discharged from ED ambulatory.   

## 2020-09-23 ENCOUNTER — Other Ambulatory Visit: Payer: Self-pay

## 2020-09-23 ENCOUNTER — Emergency Department (HOSPITAL_COMMUNITY)
Admission: EM | Admit: 2020-09-23 | Discharge: 2020-09-23 | Disposition: A | Discharge status: Home / Self Care | Payer: Self-pay | Attending: Emergency Medicine | Admitting: Emergency Medicine

## 2020-09-23 ENCOUNTER — Encounter (HOSPITAL_COMMUNITY): Payer: Self-pay | Admitting: Emergency Medicine

## 2020-09-23 DIAGNOSIS — Z853 Personal history of malignant neoplasm of breast: Secondary | ICD-10-CM | POA: Insufficient documentation

## 2020-09-23 DIAGNOSIS — E119 Type 2 diabetes mellitus without complications: Secondary | ICD-10-CM | POA: Insufficient documentation

## 2020-09-23 DIAGNOSIS — I10 Essential (primary) hypertension: Secondary | ICD-10-CM | POA: Insufficient documentation

## 2020-09-23 HISTORY — DX: Essential (primary) hypertension: I10

## 2020-09-23 LAB — CBC WITH DIFFERENTIAL/PLATELET
Abs Immature Granulocytes: 0.09 10*3/uL — ABNORMAL HIGH (ref 0.00–0.07)
Basophils Absolute: 0 10*3/uL (ref 0.0–0.1)
Basophils Relative: 1 %
Eosinophils Absolute: 0.1 10*3/uL (ref 0.0–0.5)
Eosinophils Relative: 1 %
HCT: 35 % — ABNORMAL LOW (ref 36.0–46.0)
Hemoglobin: 10.5 g/dL — ABNORMAL LOW (ref 12.0–15.0)
Immature Granulocytes: 2 %
Lymphocytes Relative: 39 %
Lymphs Abs: 2.2 10*3/uL (ref 0.7–4.0)
MCH: 29.5 pg (ref 26.0–34.0)
MCHC: 30 g/dL (ref 30.0–36.0)
MCV: 98.3 fL (ref 80.0–100.0)
Monocytes Absolute: 0.3 10*3/uL (ref 0.1–1.0)
Monocytes Relative: 6 %
Neutro Abs: 2.9 10*3/uL (ref 1.7–7.7)
Neutrophils Relative %: 51 %
Platelets: 245 10*3/uL (ref 150–400)
RBC: 3.56 MIL/uL — ABNORMAL LOW (ref 3.87–5.11)
RDW: 16.7 % — ABNORMAL HIGH (ref 11.5–15.5)
WBC: 5.6 10*3/uL (ref 4.0–10.5)
nRBC: 0 % (ref 0.0–0.2)

## 2020-09-23 LAB — BASIC METABOLIC PANEL
Anion gap: 7 (ref 5–15)
BUN: 10 mg/dL (ref 8–23)
CO2: 28 mmol/L (ref 22–32)
Calcium: 8.6 mg/dL — ABNORMAL LOW (ref 8.9–10.3)
Chloride: 107 mmol/L (ref 98–111)
Creatinine, Ser: 0.7 mg/dL (ref 0.44–1.00)
GFR, Estimated: >60 mL/min (ref >60)
Glucose, Bld: 141 mg/dL — ABNORMAL HIGH (ref 70–99)
Potassium: 3.1 mmol/L — ABNORMAL LOW (ref 3.5–5.1)
Sodium: 142 mmol/L (ref 135–145)

## 2020-09-23 LAB — CBG MONITORING, ED: Glucose-Capillary: 132 mg/dL — ABNORMAL HIGH (ref 70–99)

## 2020-09-23 MED ORDER — AMLODIPINE BESYLATE 5 MG PO TABS
5.0000 mg | ORAL_TABLET | Freq: Once | ORAL | Status: AC
  Administered 2020-09-23: 5 mg via ORAL
  Filled 2020-09-23: qty 1

## 2020-09-23 MED ORDER — LOSARTAN POTASSIUM 50 MG PO TABS: 50.0000 mg | ORAL_TABLET | Freq: Every day | ORAL | 6 refills | Status: DC

## 2020-09-23 NOTE — ED Provider Notes (Signed)
[facility] EMERGENCY DEPARTMENT Provider Note   CSN: [id number] Arrival date & time: 09/23/20  0500     History Chief Complaint  Patient presents with  . Hypertension    Marie Church is a 65 y.o. female.  Patient returns to the emergency department with persisting complaints of elevated blood pressure.  Patient was recently in the emergency department and had a work-up for elevated blood pressure associated with shortness of breath.  Patient reports that she has been experiencing dizziness and lightheadedness, checked her blood pressure at home and it was 190/100.  She has not followed up with her primary care doctor yet.        Past Medical History:  Diagnosis Date  . Hypertension     Patient Active Problem List   Diagnosis Date Noted  . Severe protein-calorie malnutrition ([place]) 08/16/2020  . Ulcer of right groin, limited to breakdown of skin ([place]) 08/08/2020  . Anemia of chronic disease 07/31/2020  . GERD without esophagitis 07/31/2020  . Type 2 diabetes mellitus without complication, without long-term current use of insulin ([place]) 07/31/2020  . Bilateral pneumonia 07/19/2020  . Breast cancer ([place]) 04/03/2020  . Malignant neoplasm of upper-outer quadrant of right breast in female, estrogen receptor negative ([place]) 03/18/2020  . Sepsis ([place]) 03/14/2020  . Axillary adenopathy 03/12/2020  . Macrocytic anemia 02/29/2020  . Physical debility 01/30/2020  . 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 Difficulty in walking 03/25/2018  . Osteoarthritis of knee 03/25/2018  . Arthritis 01/28/2018  . Essential hypertension 01/28/2018  . Bilateral knee pain 12/22/2011  . Dizziness 12/22/2011  . Eustachian tube dysfunction 12/22/2011  . Otalgia 12/22/2011  . Sinusitis 12/22/2011  . Sore throat 12/22/2011    History reviewed. No pertinent surgical history.   OB History   No obstetric history on file.     No family history on file.  Social History    Tobacco Use  . Smoking status: Never Smoker  . Smokeless tobacco: Never Used  Vaping Use  . Vaping Use: Never used  Substance Use Topics  . Alcohol use: Never  . Drug use: Never    Home Medications Prior to Admission medications   Medication Sig Start Date End Date Taking? Authorizing Provider  losartan (COZAAR) 50 MG tablet Take 1 tablet (50 mg total) by mouth daily. 09/23/20  Yes Jillyan Plitt, Gwenyth Allegra, MD  lidocaine (LIDODERM) 5 % Place 1 patch onto the skin daily. Remove & Discard patch within 12 hours or as directed by MD 09/17/20   Randal Buba, April, MD    Allergies    Doxycycline, Amoxicillin-pot clavulanate, and Lisinopril  Review of Systems   Review of Systems  Neurological: Positive for dizziness.  All other systems reviewed and are negative.   Physical Exam Updated Vital Signs BP (!) 188/108   Pulse 89   Temp 98 F (36.7 C) (Oral)   Resp (!) 25   SpO2 98%   Physical Exam Vitals and nursing note reviewed.  Constitutional:      General: She is not in acute distress.    Appearance: Normal appearance. She is well-developed.  HENT:     Head: Normocephalic and atraumatic.     Right Ear: Hearing normal.     Left Ear: Hearing normal.     Nose: Nose normal.  Eyes:     Conjunctiva/sclera: Conjunctivae normal.     Pupils: Pupils are equal, round, and reactive to light.  Cardiovascular:     Rate and Rhythm: Regular rhythm.  Heart sounds: S1 normal and S2 normal. No murmur heard. No friction rub. No gallop.   Pulmonary:     Effort: Pulmonary effort is normal. No respiratory distress.     Breath sounds: Normal breath sounds.  Chest:     Chest wall: No tenderness.  Abdominal:     General: Bowel sounds are normal.     Palpations: Abdomen is soft.     Tenderness: There is no abdominal tenderness. There is no guarding or rebound. Negative signs include Murphy's sign and McBurney's sign.     Hernia: No hernia is present.  Musculoskeletal:        General: Normal  range of motion.     Cervical back: Normal range of motion and neck supple.  Skin:    General: Skin is warm and dry.     Findings: No rash.  Neurological:     Mental Status: She is alert and oriented to person, place, and time.     GCS: GCS eye subscore is 4. GCS verbal subscore is 5. GCS motor subscore is 6.     Cranial Nerves: No cranial nerve deficit.     Sensory: No sensory deficit.     Coordination: Coordination normal.  Psychiatric:        Speech: Speech normal.        Behavior: Behavior normal.        Thought Content: Thought content normal.     ED Results / Procedures / Treatments   Labs (all labs ordered are listed, but only abnormal results are displayed) Labs Reviewed  CBC WITH DIFFERENTIAL/PLATELET - Abnormal; Notable for the following components:      Result Value   RBC 3.56 (*)    Hemoglobin 10.5 (*)    HCT 35.0 (*)    RDW 16.7 (*)    Abs Immature Granulocytes 0.09 (*)    All other components within normal limits  BASIC METABOLIC PANEL - Abnormal; Notable for the following components:   Potassium 3.1 (*)    Glucose, Bld 141 (*)    Calcium 8.6 (*)    All other components within normal limits  CBG MONITORING, ED - Abnormal; Notable for the following components:   Glucose-Capillary 132 (*)    All other components within normal limits    EKG None  Radiology No results found.  Procedures Procedures   Medications Ordered in ED Medications  amLODipine (NORVASC) tablet 5 mg (5 mg Oral Given 09/23/20 1950)    ED Course  I have reviewed the triage vital signs and the nursing notes.  Pertinent labs & imaging results that were available during my care of the patient were reviewed by me and considered in my medical decision making (see chart for details).    MDM Rules/Calculators/A&P                          Patient presents to the emergency department for evaluation of elevated blood pressure.  Patient was seen within the last several days with similar.   At that previous hospitalization patient had thorough cardiac evaluation, PE study that were negative.  Today she has complaints of mild dizziness but has a normal neurologic exam.  Blood pressure persistently elevated, will require additional medication and follow-up with primary care.  Final Clinical Impression(s) / ED Diagnoses Final diagnoses:  Primary hypertension    Rx / DC Orders ED Discharge Orders         Ordered  losartan (COZAAR) 50 MG tablet  Daily        09/23/20 0605           Orpah Greek, MD 09/23/20 [phone]

## 2020-09-23 NOTE — ED Triage Notes (Signed)
Patient reported elevated blood pressure at home this evening BP=190/100 , mild dizziness/lightheaded.

## 2020-10-03 ENCOUNTER — Emergency Department (HOSPITAL_COMMUNITY): Payer: Self-pay

## 2020-10-03 ENCOUNTER — Inpatient Hospital Stay (HOSPITAL_COMMUNITY)
Admission: EM | Admit: 2020-10-03 | Discharge: 2020-10-07 | DRG: 291 | Disposition: A | Discharge status: Home / Self Care | Payer: Self-pay | Attending: Internal Medicine | Admitting: Internal Medicine

## 2020-10-03 DIAGNOSIS — E876 Hypokalemia: Secondary | ICD-10-CM | POA: Diagnosis present

## 2020-10-03 DIAGNOSIS — I5021 Acute systolic (congestive) heart failure: Secondary | ICD-10-CM

## 2020-10-03 DIAGNOSIS — T451X5A Adverse effect of antineoplastic and immunosuppressive drugs, initial encounter: Secondary | ICD-10-CM | POA: Diagnosis present

## 2020-10-03 DIAGNOSIS — C50411 Malignant neoplasm of upper-outer quadrant of right female breast: Secondary | ICD-10-CM | POA: Diagnosis present

## 2020-10-03 DIAGNOSIS — I5041 Acute combined systolic (congestive) and diastolic (congestive) heart failure: Secondary | ICD-10-CM | POA: Diagnosis present

## 2020-10-03 DIAGNOSIS — I272 Pulmonary hypertension, unspecified: Secondary | ICD-10-CM | POA: Diagnosis present

## 2020-10-03 DIAGNOSIS — R030 Elevated blood-pressure reading, without diagnosis of hypertension: Secondary | ICD-10-CM

## 2020-10-03 DIAGNOSIS — G8929 Other chronic pain: Secondary | ICD-10-CM | POA: Diagnosis present

## 2020-10-03 DIAGNOSIS — C779 Secondary and unspecified malignant neoplasm of lymph node, unspecified: Secondary | ICD-10-CM | POA: Diagnosis present

## 2020-10-03 DIAGNOSIS — Z9221 Personal history of antineoplastic chemotherapy: Secondary | ICD-10-CM

## 2020-10-03 DIAGNOSIS — Z20822 Contact with and (suspected) exposure to covid-19: Secondary | ICD-10-CM | POA: Diagnosis present

## 2020-10-03 DIAGNOSIS — J9601 Acute respiratory failure with hypoxia: Secondary | ICD-10-CM | POA: Diagnosis present

## 2020-10-03 DIAGNOSIS — C50919 Malignant neoplasm of unspecified site of unspecified female breast: Secondary | ICD-10-CM | POA: Diagnosis present

## 2020-10-03 DIAGNOSIS — D638 Anemia in other chronic diseases classified elsewhere: Secondary | ICD-10-CM | POA: Diagnosis present

## 2020-10-03 DIAGNOSIS — D649 Anemia, unspecified: Secondary | ICD-10-CM | POA: Diagnosis present

## 2020-10-03 DIAGNOSIS — I429 Cardiomyopathy, unspecified: Secondary | ICD-10-CM | POA: Diagnosis present

## 2020-10-03 DIAGNOSIS — R0902 Hypoxemia: Secondary | ICD-10-CM | POA: Diagnosis present

## 2020-10-03 DIAGNOSIS — E119 Type 2 diabetes mellitus without complications: Secondary | ICD-10-CM | POA: Diagnosis present

## 2020-10-03 DIAGNOSIS — D539 Nutritional anemia, unspecified: Secondary | ICD-10-CM | POA: Diagnosis present

## 2020-10-03 DIAGNOSIS — I110 Hypertensive heart disease with heart failure: Principal | ICD-10-CM | POA: Diagnosis present

## 2020-10-03 DIAGNOSIS — R778 Other specified abnormalities of plasma proteins: Secondary | ICD-10-CM

## 2020-10-03 DIAGNOSIS — I160 Hypertensive urgency: Secondary | ICD-10-CM

## 2020-10-03 DIAGNOSIS — Z171 Estrogen receptor negative status [ER-]: Secondary | ICD-10-CM

## 2020-10-03 DIAGNOSIS — R7989 Other specified abnormal findings of blood chemistry: Secondary | ICD-10-CM

## 2020-10-03 DIAGNOSIS — R0602 Shortness of breath: Secondary | ICD-10-CM

## 2020-10-03 HISTORY — DX: Malignant neoplasm of unspecified site of unspecified female breast: C50.919

## 2020-10-03 HISTORY — DX: Type 2 diabetes mellitus without complications: E11.9

## 2020-10-03 LAB — TROPONIN I (HIGH SENSITIVITY)
Troponin I (High Sensitivity): 34 ng/L — ABNORMAL HIGH (ref <18)
Troponin I (High Sensitivity): 39 ng/L — ABNORMAL HIGH (ref <18)

## 2020-10-03 LAB — CBC WITH DIFFERENTIAL/PLATELET
Abs Immature Granulocytes: 0.05 10*3/uL (ref 0.00–0.07)
Basophils Absolute: 0 10*3/uL (ref 0.0–0.1)
Basophils Relative: 0 %
Eosinophils Absolute: 0.1 10*3/uL (ref 0.0–0.5)
Eosinophils Relative: 2 %
HCT: 35.8 % — ABNORMAL LOW (ref 36.0–46.0)
Hemoglobin: 10.8 g/dL — ABNORMAL LOW (ref 12.0–15.0)
Immature Granulocytes: 1 %
Lymphocytes Relative: 41 %
Lymphs Abs: 2.3 10*3/uL (ref 0.7–4.0)
MCH: 28.7 pg (ref 26.0–34.0)
MCHC: 30.2 g/dL (ref 30.0–36.0)
MCV: 95.2 fL (ref 80.0–100.0)
Monocytes Absolute: 0.4 10*3/uL (ref 0.1–1.0)
Monocytes Relative: 8 %
Neutro Abs: 2.7 10*3/uL (ref 1.7–7.7)
Neutrophils Relative %: 48 %
Platelets: 250 10*3/uL (ref 150–400)
RBC: 3.76 MIL/uL — ABNORMAL LOW (ref 3.87–5.11)
RDW: 15.7 % — ABNORMAL HIGH (ref 11.5–15.5)
WBC: 5.7 10*3/uL (ref 4.0–10.5)
nRBC: 0 % (ref 0.0–0.2)

## 2020-10-03 LAB — BASIC METABOLIC PANEL
Anion gap: 7 (ref 5–15)
BUN: 7 mg/dL — ABNORMAL LOW (ref 8–23)
CO2: 28 mmol/L (ref 22–32)
Calcium: 8.9 mg/dL (ref 8.9–10.3)
Chloride: 107 mmol/L (ref 98–111)
Creatinine, Ser: 0.69 mg/dL (ref 0.44–1.00)
GFR, Estimated: >60 mL/min (ref >60)
Glucose, Bld: 117 mg/dL — ABNORMAL HIGH (ref 70–99)
Potassium: 3.2 mmol/L — ABNORMAL LOW (ref 3.5–5.1)
Sodium: 142 mmol/L (ref 135–145)

## 2020-10-03 LAB — CBG MONITORING, ED: Glucose-Capillary: 153 mg/dL — ABNORMAL HIGH (ref 70–99)

## 2020-10-03 LAB — RESP PANEL BY RT-PCR (FLU A&B, COVID) ARPGX2
Influenza A by PCR: NEGATIVE
Influenza B by PCR: NEGATIVE
SARS Coronavirus 2 by RT PCR: NEGATIVE

## 2020-10-03 LAB — D-DIMER, QUANTITATIVE: D-Dimer, Quant: 1.32 ug/mL-FEU — ABNORMAL HIGH (ref 0.00–0.50)

## 2020-10-03 LAB — BRAIN NATRIURETIC PEPTIDE: B Natriuretic Peptide: 97.9 pg/mL (ref 0.0–100.0)

## 2020-10-03 IMAGING — CT CT ANGIO CHEST
2 of 6 series · 19 of 46 positions shown · IV contrast (omnipaque)
Comparison: [DATE]

CLINICAL DATA: Positive D-dimer, shortness of breath

EXAM:
CT ANGIOGRAPHY CHEST WITH CONTRAST
TECHNIQUE: Multidetector CT imaging of the chest was performed using the
standard protocol during bolus administration of intravenous
contrast. Multiplanar CT image reconstructions and MIPs were
obtained to evaluate the vascular anatomy.
CONTRAST:  50mL OMNIPAQUE IOHEXOL 350 MG/ML SOLN

[Series 6: thins · axial · 0.79mm/px · z∈[+1304,+1531]mm · 16 of 249 slices shown]
[im 11/249  lung]
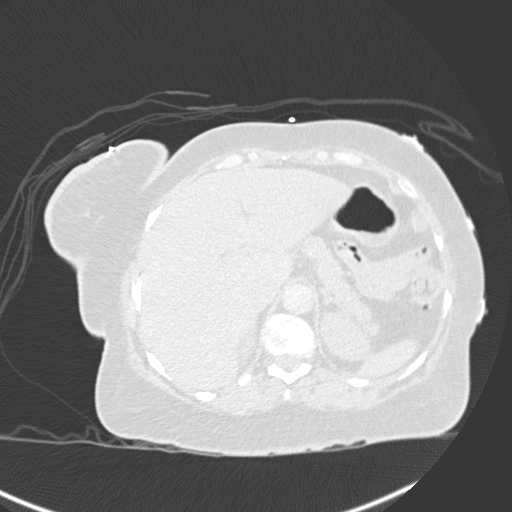
[im 33/249  soft-tissue]
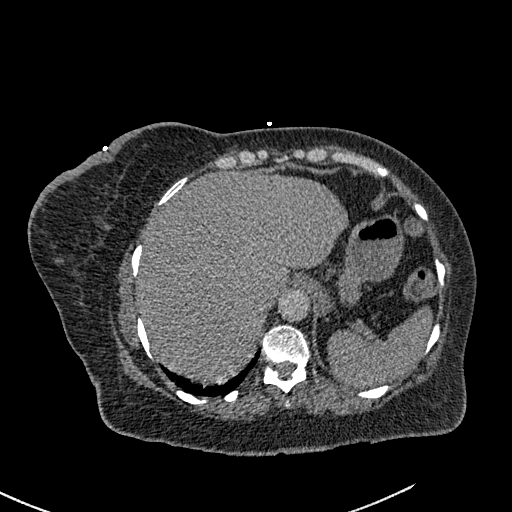
[im 44/249  lung]
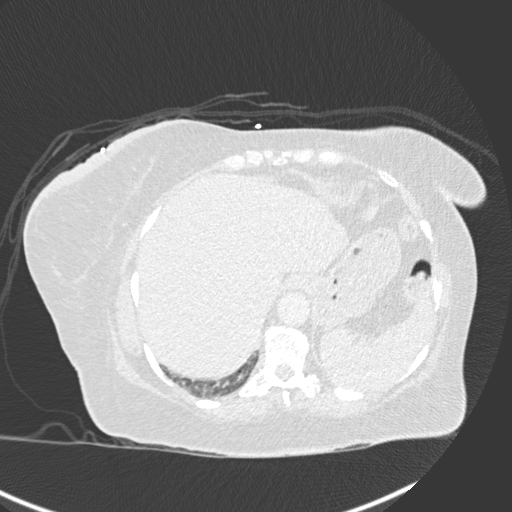
[im 54/249  soft-tissue]
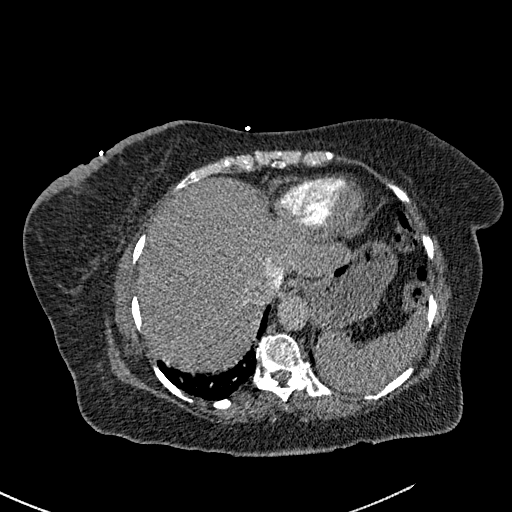
[im 76/249  lung]
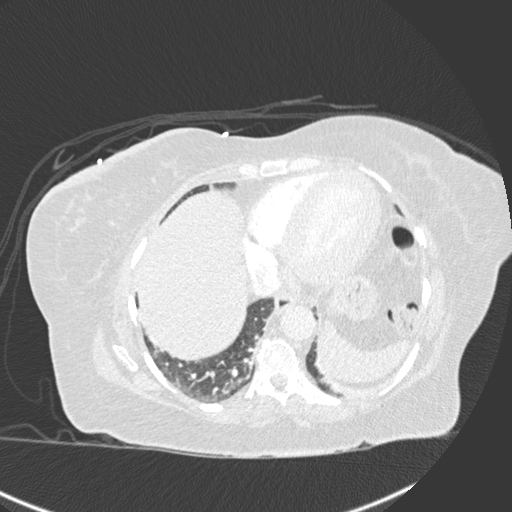
[im 87/249  soft-tissue]
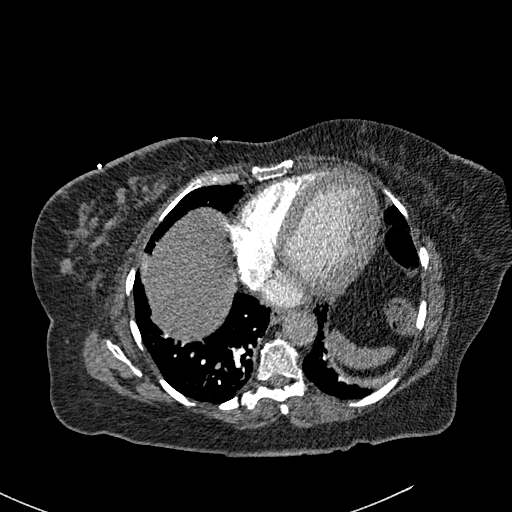
[im 98/249  lung]
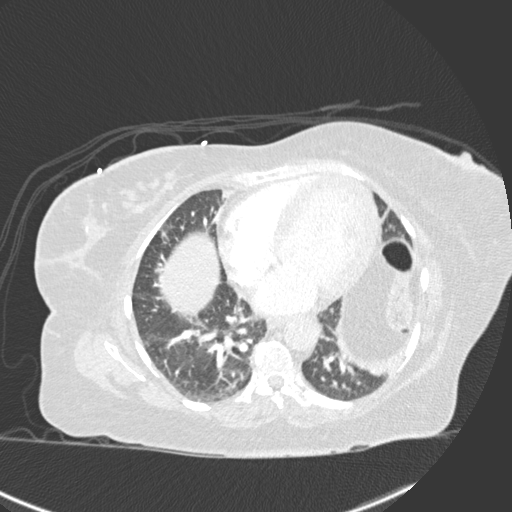
[im 119/249  soft-tissue]
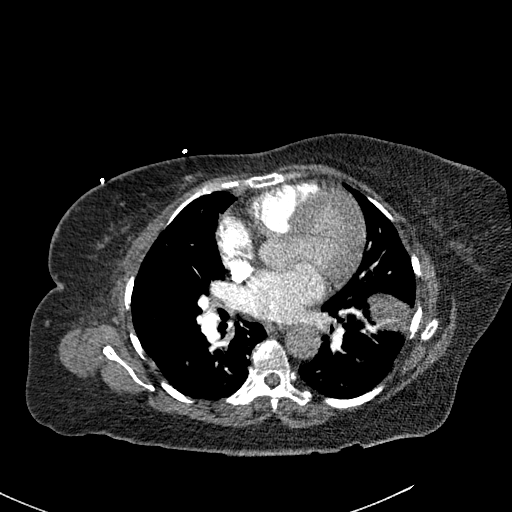
[im 130/249  lung]
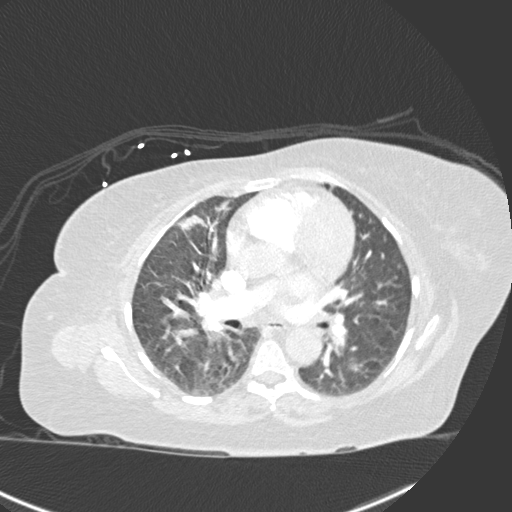
[im 151/249  soft-tissue]
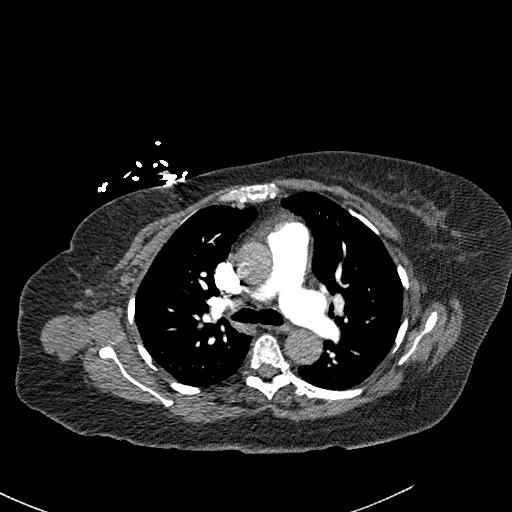
[im 162/249  lung]
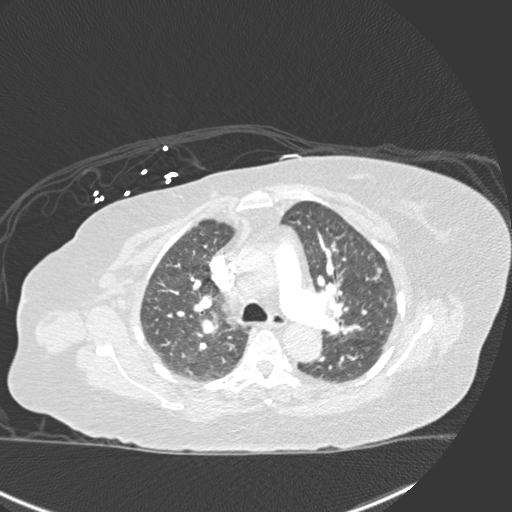
[im 173/249  soft-tissue]
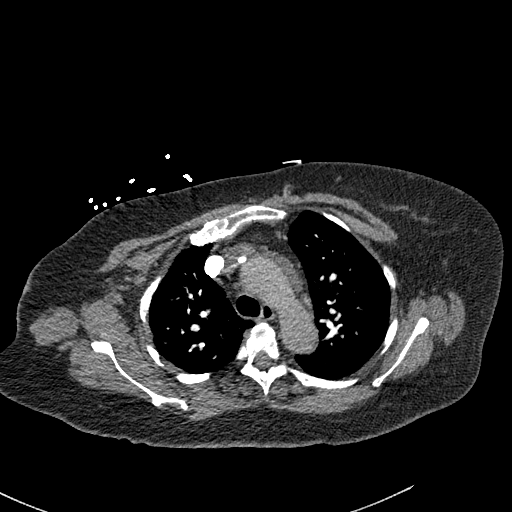
[im 195/249  lung]
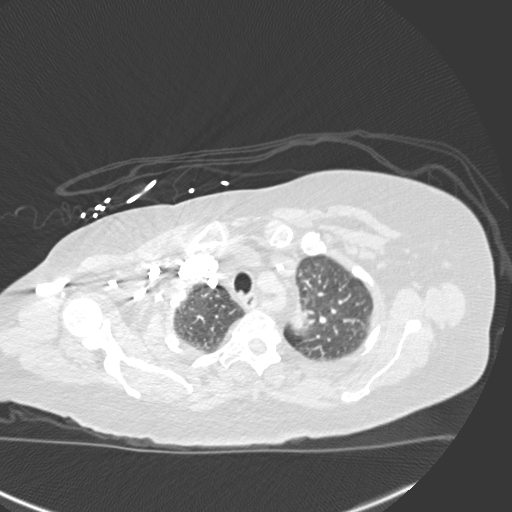
[im 205/249  soft-tissue]
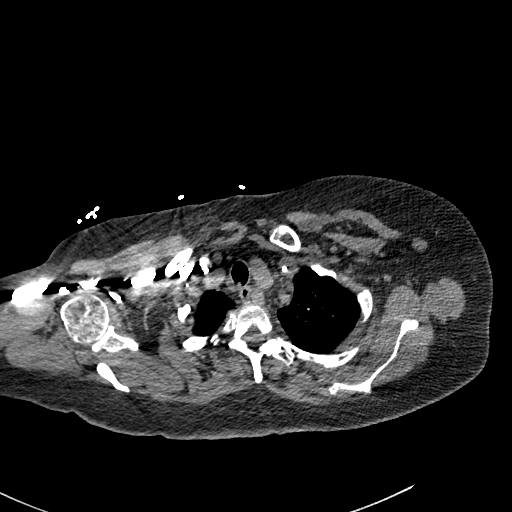
[im 216/249  lung]
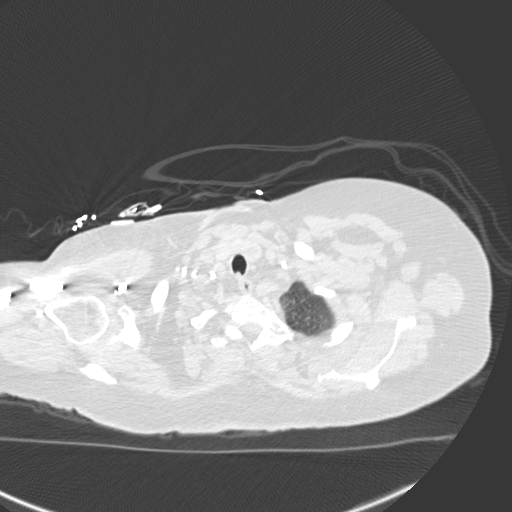
[im 238/249  soft-tissue]
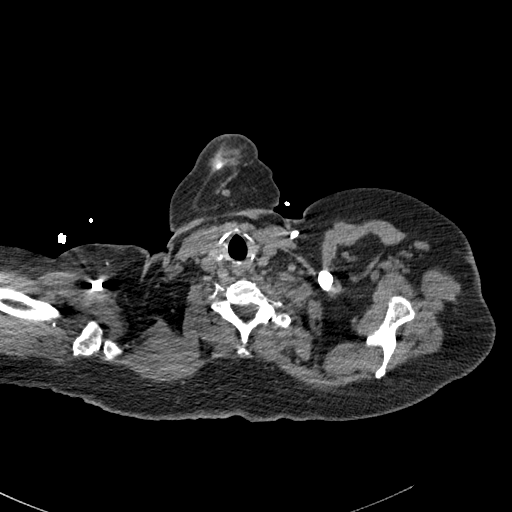

[Series 8: coronal mpr · coronal · 0.59mm/px · 3 of 149 slices shown]
[im 38/149  soft-tissue]
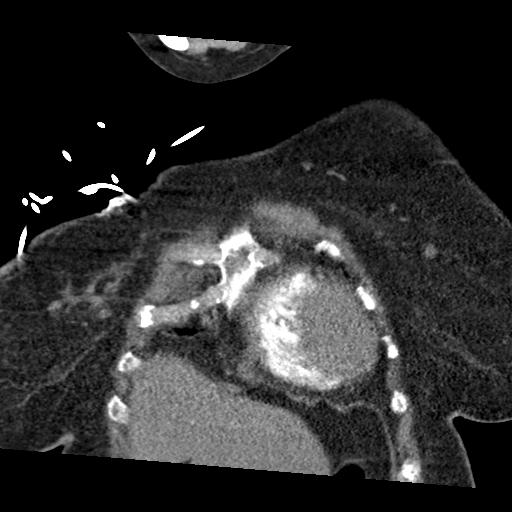
[im 75/149  soft-tissue]
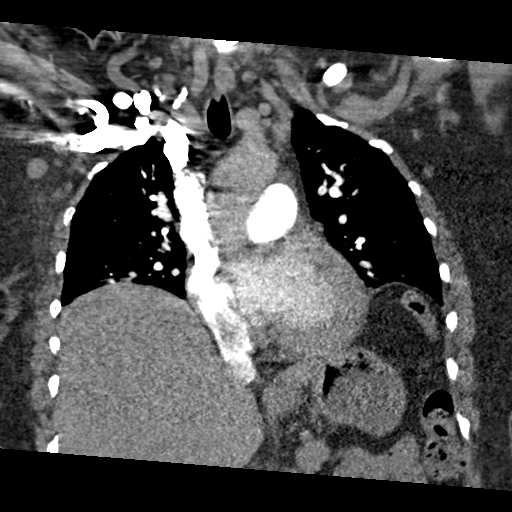
[im 112/149  soft-tissue]
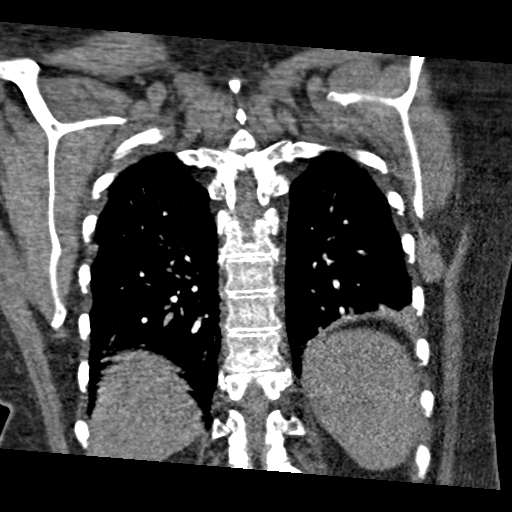

[19 of 46 positions shown; findings below may reference images not displayed]

FINDINGS: Cardiovascular:  Left port catheter tubing mid SVC level.

Tortuosity of the major branch vessels. Negative for aneurysm or
dissection. No mediastinal hemorrhage or hematoma.

Pulmonary arteries appear patent. No significant filling defect or
pulmonary embolus by CTA.

Normal heart size.  No pericardial effusion.

Mediastinum/Nodes: Stable bilateral axillary adenopathy.

No significant mediastinal hilar adenopathy.

Thyroid unremarkable. Trachea central airways are patent. Esophagus
nondilated. No hiatal hernia.

Lungs/Pleura: Anterior right middle lobe and lingula scarring. Low
lung volumes with hypoventilatory changes noted. Minor basilar
atelectasis. No acute airspace process, pneumonia or consolidation.
No interstitial process or edema. No pleural abnormality, effusion
or pneumothorax.

Upper Abdomen: No acute abnormality.

Musculoskeletal: No acute osseous finding or compression fracture.
Sternum intact.

Review of the MIP images confirms the above findings.
IMPRESSION: Negative for significant acute pulmonary embolus by CTA. Overall
stable exam.

Low lung volumes with areas of parenchymal scarring and atelectasis.

No acute airspace process

Similar bilateral axillary adenopathy.

## 2020-10-03 IMAGING — CR DG CHEST 2V
2 series · 2 of 2 positions shown · non-contrast
Comparison: [DATE]

CLINICAL DATA: Shortness of breath, hypertension

EXAM:
CHEST - 2 VIEW

[chest pa]
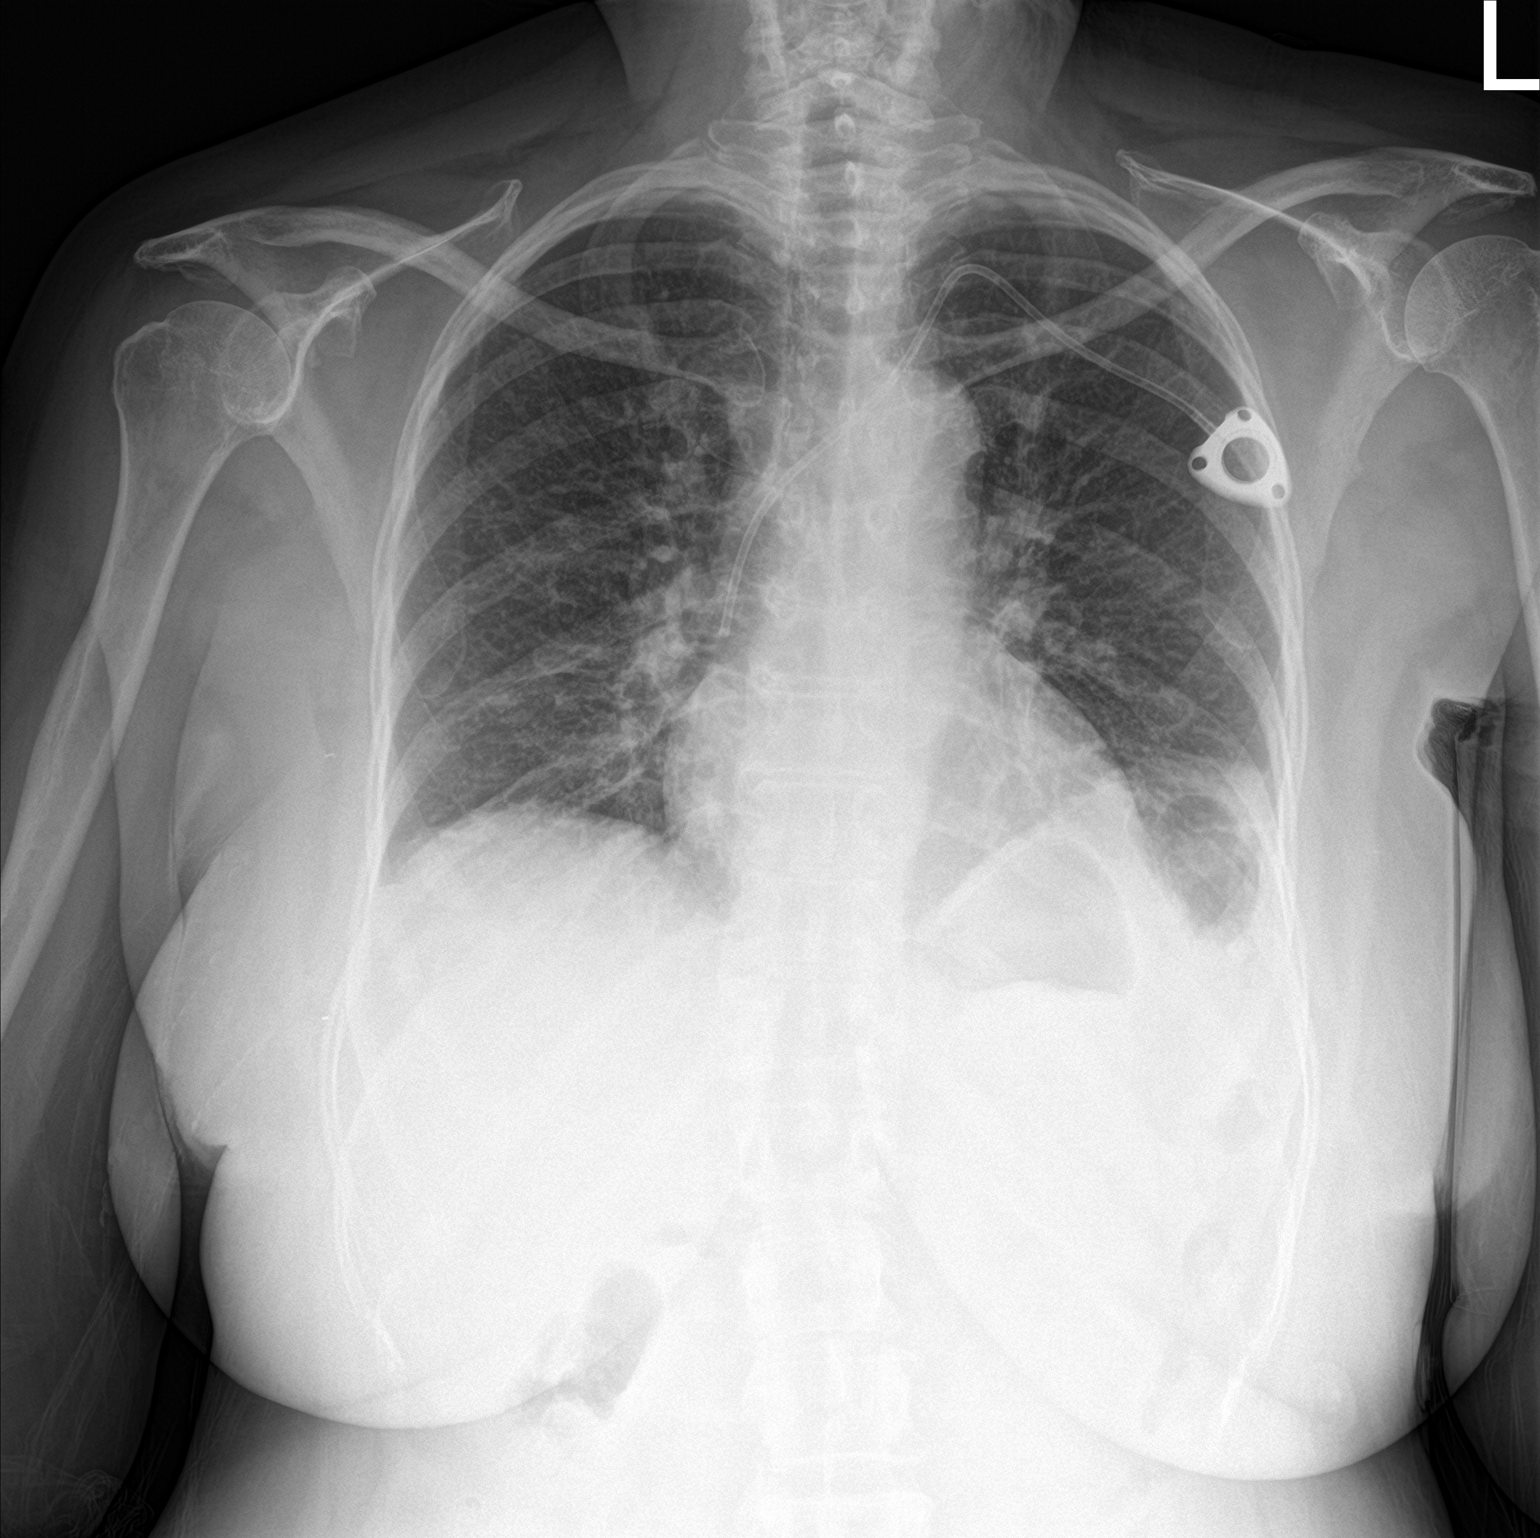

[chest lat]
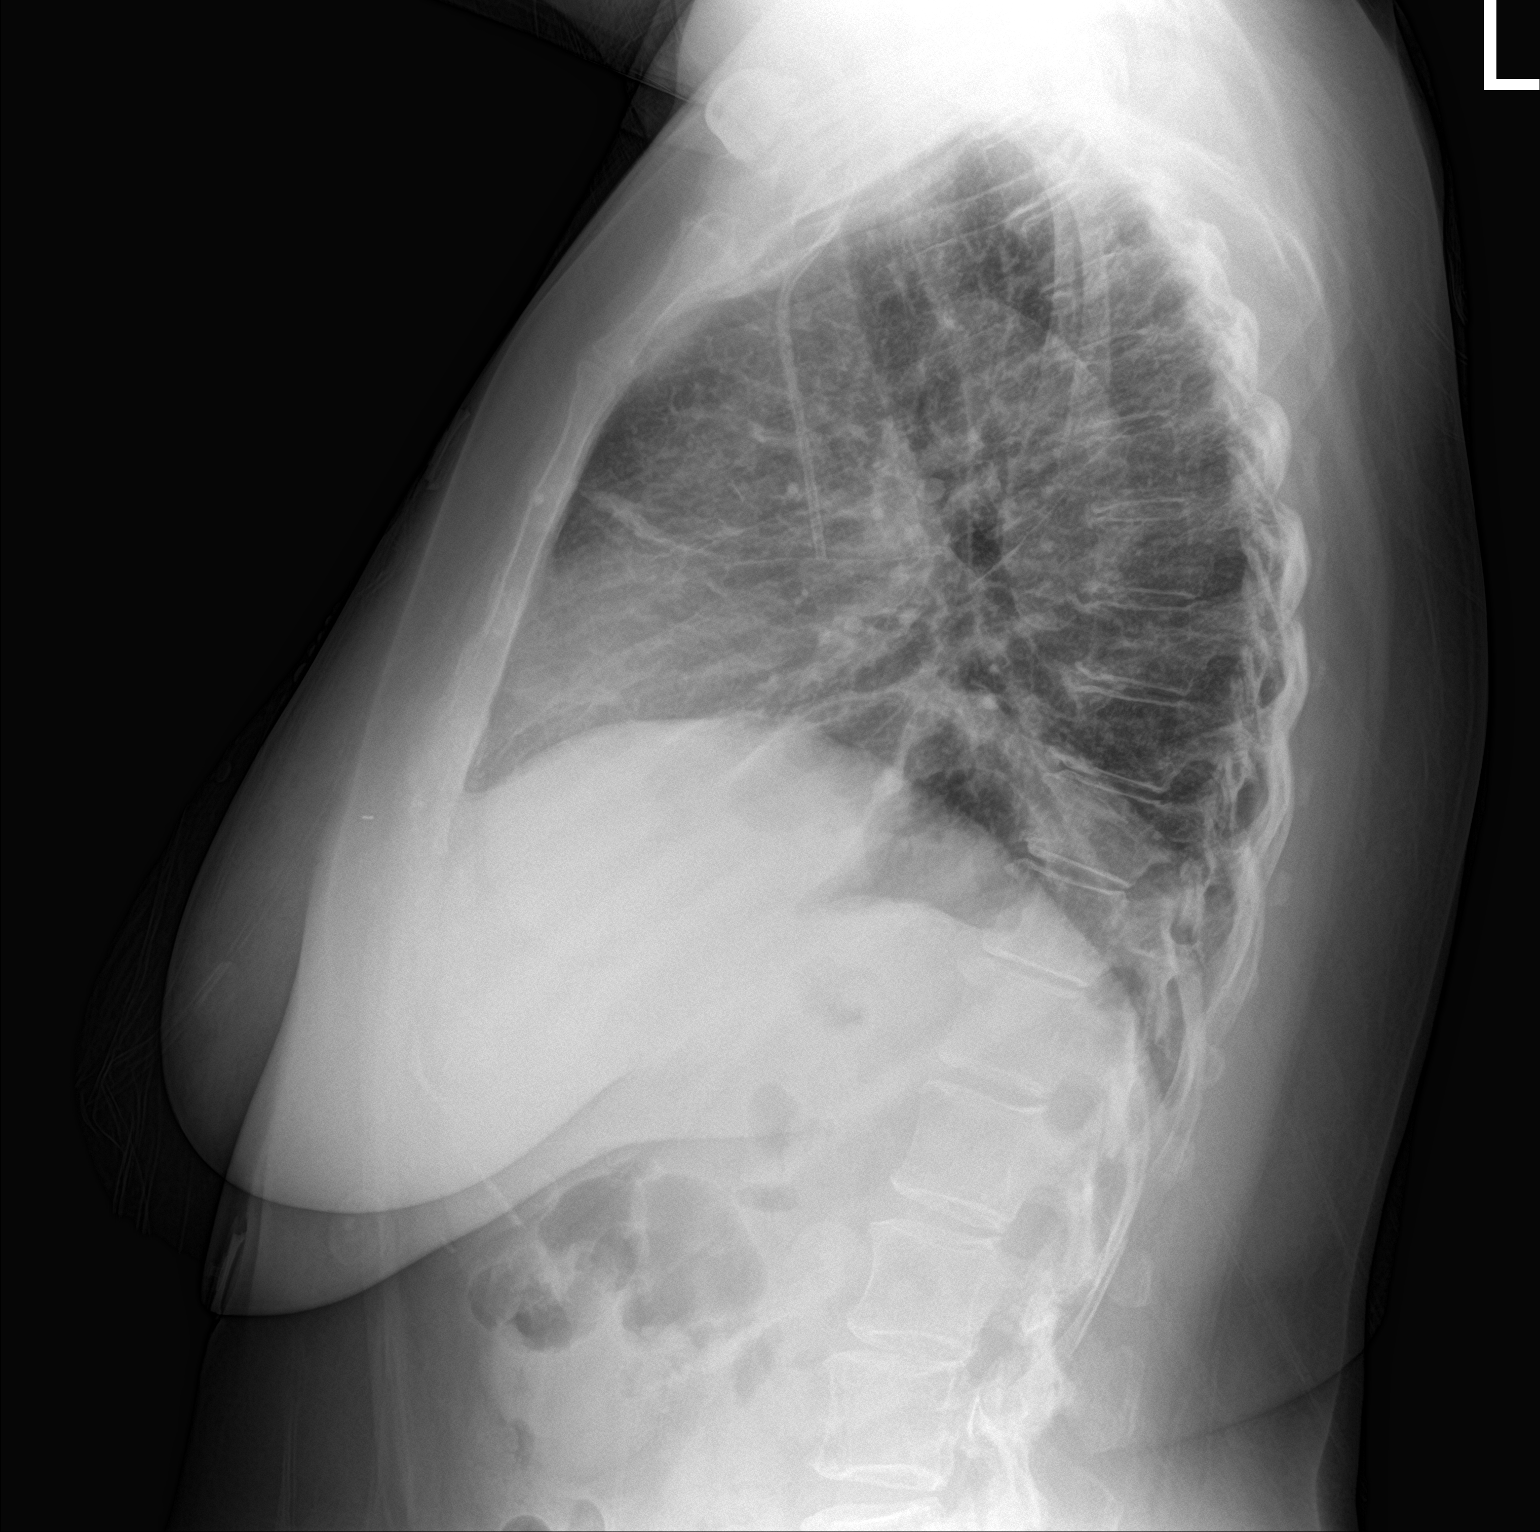

[2 of 2 positions shown; findings below may reference images not displayed]

FINDINGS: The heart size and mediastinal contours are within normal limits.
Left chest port catheter. Mild, diffuse interstitial pulmonary
opacity, similar to prior examination. The visualized skeletal
structures are unremarkable.
IMPRESSION: Mild, diffuse interstitial pulmonary opacity, similar to prior
examination and may reflect mild edema and/or chronic interstitial
change. No new or focal airspace opacity.

## 2020-10-03 MED ORDER — SODIUM CHLORIDE 0.9 % IV BOLUS
1000.0000 mL | Freq: Once | INTRAVENOUS | Status: AC
  Administered 2020-10-03: 1000 mL via INTRAVENOUS

## 2020-10-03 MED ORDER — HYDROCHLOROTHIAZIDE 25 MG PO TABS
25.0000 mg | ORAL_TABLET | Freq: Every day | ORAL | Status: DC
  Administered 2020-10-03 – 2020-10-04 (×2): 25 mg via ORAL
  Filled 2020-10-03 (×2): qty 1

## 2020-10-03 MED ORDER — HYDROXYZINE HCL 25 MG PO TABS
25.0000 mg | ORAL_TABLET | Freq: Once | ORAL | Status: AC
  Administered 2020-10-03: 25 mg via ORAL
  Filled 2020-10-03: qty 1

## 2020-10-03 MED ORDER — LOSARTAN POTASSIUM 50 MG PO TABS
50.0000 mg | ORAL_TABLET | Freq: Once | ORAL | Status: AC
  Administered 2020-10-03: 50 mg via ORAL
  Filled 2020-10-03: qty 1

## 2020-10-03 MED ORDER — LORAZEPAM 1 MG PO TABS
1.0000 mg | ORAL_TABLET | Freq: Once | ORAL | Status: AC
  Administered 2020-10-03: 1 mg via ORAL
  Filled 2020-10-03: qty 1

## 2020-10-03 MED ORDER — POTASSIUM CHLORIDE CRYS ER 20 MEQ PO TBCR
40.0000 meq | EXTENDED_RELEASE_TABLET | Freq: Once | ORAL | Status: AC
  Administered 2020-10-03: 40 meq via ORAL
  Filled 2020-10-03: qty 2

## 2020-10-03 MED ORDER — LOSARTAN POTASSIUM 50 MG PO TABS
50.0000 mg | ORAL_TABLET | Freq: Every day | ORAL | Status: DC
  Administered 2020-10-04: 50 mg via ORAL
  Filled 2020-10-03: qty 1

## 2020-10-03 MED ORDER — IOHEXOL 350 MG/ML SOLN
50.0000 mL | Freq: Once | INTRAVENOUS | Status: AC | PRN
  Administered 2020-10-03: 50 mL via INTRAVENOUS

## 2020-10-03 MED ORDER — SODIUM CHLORIDE 0.9% FLUSH
3.0000 mL | Freq: Two times a day (BID) | INTRAVENOUS | Status: DC
  Administered 2020-10-03 – 2020-10-07 (×8): 3 mL via INTRAVENOUS

## 2020-10-03 MED ORDER — SODIUM CHLORIDE 0.9% FLUSH: 3.0000 mL | INTRAVENOUS | Status: DC | PRN

## 2020-10-03 MED ORDER — ENOXAPARIN SODIUM 40 MG/0.4ML IJ SOSY
40.0000 mg | PREFILLED_SYRINGE | INTRAMUSCULAR | Status: DC
  Administered 2020-10-03 – 2020-10-06 (×4): 40 mg via SUBCUTANEOUS
  Filled 2020-10-03 (×4): qty 0.4

## 2020-10-03 MED ORDER — SODIUM CHLORIDE 0.9 % IV SOLN
250.0000 mL | INTRAVENOUS | Status: DC | PRN
Start: 2020-10-03 — End: 2020-10-07

## 2020-10-03 MED ORDER — LABETALOL HCL 5 MG/ML IV SOLN
10.0000 mg | Freq: Once | INTRAVENOUS | Status: AC
  Administered 2020-10-03: 10 mg via INTRAVENOUS
  Filled 2020-10-03: qty 4

## 2020-10-03 MED ORDER — FENTANYL CITRATE (PF) 100 MCG/2ML IJ SOLN
50.0000 ug | Freq: Once | INTRAMUSCULAR | Status: AC
  Administered 2020-10-03: 50 ug via INTRAVENOUS
  Filled 2020-10-03: qty 2

## 2020-10-03 MED ORDER — LABETALOL HCL 5 MG/ML IV SOLN
10.0000 mg | INTRAVENOUS | Status: DC | PRN
  Filled 2020-10-03: qty 4

## 2020-10-03 NOTE — Progress Notes (Signed)
Pt requesting anxiety medication. Dr. Juleen China informed via Epic secure chat.

## 2020-10-03 NOTE — ED Notes (Signed)
Pt assisted to bedside commode

## 2020-10-03 NOTE — ED Triage Notes (Signed)
Patient reports shortness of breath that is worse at night that started several weeks ago. Patient denies history of congestive heart failure. Patient alert, oriented, and in no apparent distress at this time. Room air SpO2 97%.

## 2020-10-03 NOTE — ED Provider Notes (Signed)
[facility] EMERGENCY DEPARTMENT Provider Note   CSN: [id number] Arrival date & time: 10/03/20  1105     History Chief Complaint  Patient presents with  . Shortness of Breath    Marie Church is a 65 y.o. female.  Presented to the emergency room with concern for shortness of breath.  Patient states that she has had issues with this for the last few weeks.  Somewhat worse today prompting visit.  No associated chest pain.  States that she does deal with chronic back pain.  No change in his pain today.  Normally lives in [place], visiting son in [place].  States that she has been diagnosed with high blood pressure but has not taken any blood pressure medicine today.  Has history of breast cancer.  HPI     Past Medical History:  Diagnosis Date  . Hypertension     Patient Active Problem List   Diagnosis Date Noted  . Severe protein-calorie malnutrition ([place]) 08/16/2020  . Ulcer of right groin, limited to breakdown of skin ([place]) 08/08/2020  . Anemia of chronic disease 07/31/2020  . GERD without esophagitis 07/31/2020  . Type 2 diabetes mellitus without complication, without long-term current use of insulin ([place]) 07/31/2020  . Bilateral pneumonia 07/19/2020  . Breast cancer ([place]) 04/03/2020  . Malignant neoplasm of upper-outer quadrant of right breast in female, estrogen receptor negative ([place]) 03/18/2020  . Sepsis ([place]) 03/14/2020  . Axillary adenopathy 03/12/2020  . Macrocytic anemia 02/29/2020  . Physical debility 01/30/2020  . 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 Difficulty in walking 03/25/2018  . Osteoarthritis of knee 03/25/2018  . Arthritis 01/28/2018  . Essential hypertension 01/28/2018  . Bilateral knee pain 12/22/2011  . Dizziness 12/22/2011  . Eustachian tube dysfunction 12/22/2011  . Otalgia 12/22/2011  . Sinusitis 12/22/2011  . Sore throat 12/22/2011    No past surgical history on file.   OB History   No obstetric history on  file.     No family history on file.  Social History   Tobacco Use  . Smoking status: Never Smoker  . Smokeless tobacco: Never Used  Vaping Use  . Vaping Use: Never used  Substance Use Topics  . Alcohol use: Never  . Drug use: Never    Home Medications Prior to Admission medications   Medication Sig Start Date End Date Taking? Authorizing Provider  lidocaine (LIDODERM) 5 % Place 1 patch onto the skin daily. Remove & Discard patch within 12 hours or as directed by MD 09/17/20   Randal Buba, April, MD  losartan (COZAAR) 50 MG tablet Take 1 tablet (50 mg total) by mouth daily. 09/23/20   Orpah Greek, MD    Allergies    Doxycycline, Amoxicillin-pot clavulanate, and Lisinopril  Review of Systems   Review of Systems  Constitutional: Negative for chills and fever.  HENT: Negative for ear pain and sore throat.   Eyes: Negative for pain and visual disturbance.  Respiratory: Positive for shortness of breath. Negative for cough.   Cardiovascular: Negative for chest pain and palpitations.  Gastrointestinal: Negative for abdominal pain and vomiting.  Genitourinary: Negative for dysuria and hematuria.  Musculoskeletal: Positive for back pain. Negative for arthralgias.  Skin: Negative for color change and rash.  Neurological: Negative for seizures and syncope.  All other systems reviewed and are negative.   Physical Exam Updated Vital Signs BP (!) 196/127   Pulse (!) 108   Temp 98.5 F (36.9 C) (Oral)   Resp (!) 21  SpO2 97%   Physical Exam Vitals and nursing note reviewed.  Constitutional:      General: She is not in acute distress.    Appearance: She is well-developed.  HENT:     Head: Normocephalic and atraumatic.  Eyes:     Conjunctiva/sclera: Conjunctivae normal.  Cardiovascular:     Rate and Rhythm: Regular rhythm. Tachycardia present.     Heart sounds: No murmur heard.   Pulmonary:     Comments: Mild tachypnea no acute distress, lungs clear Abdominal:      Palpations: Abdomen is soft.     Tenderness: There is no abdominal tenderness.  Musculoskeletal:     Cervical back: Neck supple.     Right lower leg: No edema.     Left lower leg: No edema.  Skin:    General: Skin is warm and dry.  Neurological:     General: No focal deficit present.     Mental Status: She is alert.     ED Results / Procedures / Treatments   Labs (all labs ordered are listed, but only abnormal results are displayed) Labs Reviewed  CBC WITH DIFFERENTIAL/PLATELET - Abnormal; Notable for the following components:      Result Value   RBC 3.76 (*)    Hemoglobin 10.8 (*)    HCT 35.8 (*)    RDW 15.7 (*)    All other components within normal limits  D-DIMER, QUANTITATIVE - Abnormal; Notable for the following components:   D-Dimer, Quant 1.32 (*)    All other components within normal limits  CBG MONITORING, ED - Abnormal; Notable for the following components:   Glucose-Capillary 153 (*)    All other components within normal limits  BASIC METABOLIC PANEL  BRAIN NATRIURETIC PEPTIDE  TROPONIN I (HIGH SENSITIVITY)  TROPONIN I (HIGH SENSITIVITY)    EKG EKG Interpretation  Date/Time:  Thursday Oct 03 2020 11:30:17 EDT Ventricular Rate:  110 PR Interval:  142 QRS Duration: 72 QT Interval:  352 QTC Calculation: 476 R Axis:   24 Text Interpretation: Sinus tachycardia Nonspecific T wave abnormality Abnormal ECG when compared to prior, similar appearance. No STEMI Confirmed by Antony Blackbird [phone]) on 10/03/2020 3:07:26 PM   Radiology DG Chest 2 View  Result Date: 10/03/2020 CLINICAL DATA:  Shortness of breath, hypertension EXAM: CHEST - 2 VIEW COMPARISON:  09/16/2020 FINDINGS: The heart size and mediastinal contours are within normal limits. Left chest port catheter. Mild, diffuse interstitial pulmonary opacity, similar to prior examination. The visualized skeletal structures are unremarkable. IMPRESSION: Mild, diffuse interstitial pulmonary opacity, similar to  prior examination and may reflect mild edema and/or chronic interstitial change. No new or focal airspace opacity. Electronically Signed   By: Eddie Candle M.D.   On: 10/03/2020 12:13    Procedures Procedures   Medications Ordered in ED Medications  sodium chloride 0.9 % bolus 1,000 mL (has no administration in time range)  fentaNYL (SUBLIMAZE) injection 50 mcg (50 mcg Intravenous Given 10/03/20 1433)  losartan (COZAAR) tablet 50 mg (50 mg Oral Given 10/03/20 1432)  LORazepam (ATIVAN) tablet 1 mg (1 mg Oral Given 10/03/20 1432)    ED Course  I have reviewed the triage vital signs and the nursing notes.  Pertinent labs & imaging results that were available during my care of the patient were reviewed by me and considered in my medical decision making (see chart for details).    MDM Rules/Calculators/A&P  65 year old lady with notable history of breast cancer presenting to ER with concern for shortness of breath.  On exam patient noted to be well-appearing in no distress but was noted to be mildly tachycardic as well as hypertensive.  EKG showing sinus tachycardia but no acute ischemic change.  Will check D-dimer, troponin, chest x-ray.  Provided some pain medicine, blood pressure medicine.  While awaiting work-up, signed out to Dr. Sherry Ruffing.  Dispo pending work-up and reassessment.  After the discussed management above, the patient was determined to be safe for discharge.  The patient was in agreement with this plan and all questions regarding their care were answered.  ED return precautions were discussed and the patient will return to the ED with any significant worsening of condition.  Final Clinical Impression(s) / ED Diagnoses Final diagnoses:  Shortness of breath    Rx / DC Orders ED Discharge Orders    None       Lucrezia Starch, MD 10/03/20 1524

## 2020-10-03 NOTE — H&P (Addendum)
History and Physical        Hospital Admission Note Date: 10/03/2020  Patient name: Marie Church Medical record number: [id number] Date of birth: November 08, 1955 Age: 65 y.o. Gender: female  PCP: System, Provider Not In    Patient coming from: [facility]   I have reviewed all records in the [facility].    Chief Complaint:  SOB   HPI: Marie Church is 65 y.o. female with PMH of HTN, anemia, breast cancer, and T2DM who presents for SOB. Present for 2 weeks. Worse today prompting visit. Was initially only present with exertion but now present at rest as well. Endorses chest tightness, no chest pain. Mild cough recently started. No hemoptysis. Reports some swelling of her feet bilaterally for past couple weeks as well.   Was prescribed Losartan for HTN last time she was in the ED. Reports compliance, although missed dose for today. No severe headaches or vision changes.   Lives in [place]. Visiting son in [place]. Denies known history of lung disease such as asthma or COPD. Remote smoking hx, quit >15 years ago. No known cardiac disease such as CAD, h/o MI, or CHF.    ED work-up/course:  65 year old lady with notable history of breast cancer presenting to ER with concern for shortness of breath.  On exam patient noted to be well-appearing in no distress but was noted to be mildly tachycardic as well as hypertensive.  EKG showing sinus tachycardia but no acute ischemic change.  Will check D-dimer, troponin, chest x-ray.  Provided some pain medicine, blood pressure medicine.  While awaiting work-up, signed out to Dr. Sherry Ruffing.  Dispo pending work-up and reassessment.  3:21 PM Care assumed from Dr. Roslynn Amble.  At time of transfer care, patient is waiting for results of CT PE study on the labs to look for etiology of shortness of breath.  Patient also has had elevated blood pressures and was given her blood pressure medicine,  dissipate reassessment.  Anticipate determination to see if the patient is hypoxic with ambulation.  Anticipate reassessment after work-up to determine disposition.  6:16 PM Patient's CT PE study does not show evidence of pulmonary ballismus and does show some atelectasis or scar tissue.  Troponin is elevated and slightly rising more than it was earlier this year.  Other labs similar to prior.  BNP not elevated.  On my assessment when I was talking to the patient, she did have oxygen saturations dipped to around 84% on room air.  She is feeling very short of breath.  Blood pressure improved into the 170s after I gave her labetalol.    Given the patient's new hypoxia and hypertensive symptoms, do not feel she is safe for discharge home with her ongoing shortness of breath.  She is now on oxygen, will get COVID swab.  We will call for admission for further blood pressure titration and breathing management.   Review of Systems: Positives marked in 'bold' Constitutional: Denies fever, chills, diaphoresis, poor appetite and fatigue.  HEENT: Denies photophobia, eye pain, redness, hearing loss, ear pain, congestion, sore throat, rhinorrhea, sneezing, mouth sores, trouble swallowing, neck pain, neck stiffness and tinnitus.   Respiratory: Denies SOB, DOE,  cough, chest tightness,  and wheezing.   Cardiovascular: Denies chest pain, palpitations and leg swelling.  Gastrointestinal: Denies nausea, vomiting, abdominal pain, diarrhea, constipation, blood in stool and abdominal distention.  Genitourinary: Denies dysuria, urgency, frequency, hematuria, flank pain and difficulty urinating.  Musculoskeletal: Denies myalgias, back pain, joint swelling, arthralgias and gait problem.  Skin: Denies pallor, rash and wound.  Neurological: Denies dizziness, seizures, syncope, weakness, light-headedness, numbness and headaches.  Hematological: Denies adenopathy. Easy bruising, personal or family bleeding history   Psychiatric/Behavioral: Denies suicidal ideation, mood changes, confusion, nervousness, sleep disturbance and agitation  Past Medical History: Past Medical History:  Diagnosis Date  . Hypertension     No past surgical history on file.  Medications: Prior to Admission medications   Medication Sig Start Date End Date Taking? Authorizing Provider  lidocaine (LIDODERM) 5 % Place 1 patch onto the skin daily. Remove & Discard patch within 12 hours or as directed by MD 09/17/20   Randal Buba, April, MD  losartan (COZAAR) 50 MG tablet Take 1 tablet (50 mg total) by mouth daily. 09/23/20   Orpah Greek, MD    Allergies:   Allergies  Allergen Reactions  . Doxycycline Rash  . Amoxicillin-Pot Clavulanate Rash  . Lisinopril Rash    Social History:  reports that she has never smoked. She has never used smokeless tobacco. She reports that she does not drink alcohol and does not use drugs.  Family History: No family history on file.  Physical Exam: Blood pressure (!) 180/104, pulse 92, temperature 98.5 F (36.9 C), temperature source Oral, resp. rate (!) 31, SpO2 100 %. General: Alert, awake, oriented x3, in no acute distress. Eyes: pink conjunctiva,anicteric sclera, pupils equal and reactive to light and accomodation, HEENT: normocephalic, atraumatic, oropharynx clear Neck: supple, no masses or lymphadenopathy, no goiter, no bruits, no JVD CVS: Regular rate and rhythm, without murmurs, rubs or gallops. 1-2+ pitting edema to ankles bilaterally  Resp : Mild tachypnea, no acute distress. On [place]. Clear to auscultation bilaterally, no wheezing, rales or rhonchi. Able to speak in full sentences. Occasional cough noted.  GI : Soft, nontender, nondistended, positive bowel sounds, no masses. No hepatomegaly. No hernia.  Musculoskeletal: No clubbing or cyanosis, positive pedal pulses. No contracture. ROM intact  Neuro: Grossly intact, no focal neurological deficits, strength 5/5 upper and lower  extremities bilaterally Psych: alert and oriented x 3, normal mood and affect Skin: no rashes or lesions, warm and dry   LABS on Admission: I have personally reviewed all the labs and imagings below    Basic Metabolic Panel: Recent Labs  Lab 10/03/20 1353  NA 142  K 3.2*  CL 107  CO2 28  GLUCOSE 117*  BUN 7*  CREATININE 0.69  CALCIUM 8.9   Liver Function Tests: No results for input(s): AST, ALT, ALKPHOS, BILITOT, PROT, ALBUMIN in the last 168 hours. No results for input(s): LIPASE, AMYLASE in the last 168 hours. No results for input(s): AMMONIA in the last 168 hours. CBC: Recent Labs  Lab 10/03/20 1353  WBC 5.7  NEUTROABS 2.7  HGB 10.8*  HCT 35.8*  MCV 95.2  PLT 250   Cardiac Enzymes: No results for input(s): CKTOTAL, CKMB, CKMBINDEX, TROPONINI in the last 168 hours. BNP: Invalid input(s): POCBNP CBG: Recent Labs  Lab 10/03/20 1242  GLUCAP 153*    Radiological Exams on Admission:  DG Chest 2 View  Result Date: 10/03/2020 CLINICAL DATA:  Shortness of breath, hypertension EXAM: CHEST - 2 VIEW COMPARISON:  09/16/2020 FINDINGS:  The heart size and mediastinal contours are within normal limits. Left chest port catheter. Mild, diffuse interstitial pulmonary opacity, similar to prior examination. The visualized skeletal structures are unremarkable. IMPRESSION: Mild, diffuse interstitial pulmonary opacity, similar to prior examination and may reflect mild edema and/or chronic interstitial change. No new or focal airspace opacity. Electronically Signed   By: Eddie Candle M.D.   On: 10/03/2020 12:13   CT Angio Chest PE W and/or Wo Contrast  Result Date: 10/03/2020 CLINICAL DATA:  Positive D-dimer, shortness of breath EXAM: CT ANGIOGRAPHY CHEST WITH CONTRAST TECHNIQUE: Multidetector CT imaging of the chest was performed using the standard protocol during bolus administration of intravenous contrast. Multiplanar CT image reconstructions and MIPs were obtained to evaluate the  vascular anatomy. CONTRAST:  15mL OMNIPAQUE IOHEXOL 350 MG/ML SOLN COMPARISON:  09/17/2020 FINDINGS: Cardiovascular:  Left port catheter tubing mid SVC level. Tortuosity of the major branch vessels. Negative for aneurysm or dissection. No mediastinal hemorrhage or hematoma. Pulmonary arteries appear patent. No significant filling defect or pulmonary embolus by CTA. Normal heart size.  No pericardial effusion. Mediastinum/Nodes: Stable bilateral axillary adenopathy. No significant mediastinal hilar adenopathy. Thyroid unremarkable. Trachea central airways are patent. Esophagus nondilated. No hiatal hernia. Lungs/Pleura: Anterior right middle lobe and lingula scarring. Low lung volumes with hypoventilatory changes noted. Minor basilar atelectasis. No acute airspace process, pneumonia or consolidation. No interstitial process or edema. No pleural abnormality, effusion or pneumothorax. Upper Abdomen: No acute abnormality. Musculoskeletal: No acute osseous finding or compression fracture. Sternum intact. Review of the MIP images confirms the above findings. IMPRESSION: Negative for significant acute pulmonary embolus by CTA. Overall stable exam. Low lung volumes with areas of parenchymal scarring and atelectasis. No acute airspace process Similar bilateral axillary adenopathy. Electronically Signed   By: Jerilynn Mages.  Shick M.D.   On: 10/03/2020 16:22      EKG: Independently reviewed. Sinus tachycardia. No STEMI.    Assessment/Plan Active Problems:   Anemia of chronic disease   Macrocytic anemia   Breast cancer (HCC)   Type 2 diabetes mellitus without complication, without long-term current use of insulin (HCC)   Hypoxia   Hypertensive urgency   Shortness of breath   Elevated troponin  Hypoxia with SOB O2 saturations 84% while talking, now 100% on 2L [place]. Tachypnea noted but no other signs of respiratory distress. Lung exam is benign. Unknown etiology. CXR was concerning for some mild edema and/or chronic  interstitial change. However, BNP was normal. But given LE edema and this finding with worsening SOB, would definitively r/o CHF contributing. Also evaluate for pulmonary HTN. D-dimer was elevated so CTA chest was obtained which was negative for PE. No acute airspace process was noted. Patient does not have any signs concerning for bacterial PNA. Has no known lung disease. No known COVID exposures. Low suspicion for ACS given not significantly elevated troponins and EKG unchanged from prior. Has history of anemia but HgB stable.  -observation on telemetry, continuous pulse ox  -supplemental O2 to maintain saturations >92%  -respiratory swab pending -obtain TSH; repeat CBC/BMET in AM  -if unremarkable work up in hospital, would consider outpatient pulm f/u given findings on lung imaging concerning for some chronic lung disease, may benefit from PFTs  Hypertensive Urgency  BPs have been 160-180s/100-120. Patient reports BP difficult to control. No symptoms or findings suggestive of end organ damage. Received Labetalol in ED. Received home Losartan.  -continue home Losartan -add HCTZ, may have some benefit of diuretic effect for edema  -continue Labetalol prn  for SBP>180/DBP >120, if SOB worsens consider changing to different agent as may have broncho-constrictive effect   Elevated Troponin  May be related to demand ischemia from hypoxia and poorly controlled HTN. Mildly elevated with result of 34>39. Was normal on 5/2. EKG without STEMI and unchanged from prior.  -telemetry  -repeat EKG in AM  -cycle troponins   Normocytic Anemia  Stable with HgB 10.8. Chronic.  -monitor CBC  Hypokalemia  Mild with result of 3.2  -replete with Kdur 40 mEq  -BMET in AM   T2DM  Listed in problem list. Not on any medications. No prior A1c for review. Glucose only mildly elevated at admit, 117.  -obtain A1c -fasting CBG in AM  -add SSI as needed    DVT prophylaxis: Lovenox  CODE STATUS: FULL    Consults called: None    Admission status: Observation   The medical decision making on this patient was of high complexity and the patient is at high risk for clinical deterioration, therefore this is a level 3 admission.  Severity of Illness:      The appropriate patient status for this patient is OBSERVATION. Observation status is judged to be reasonable and necessary in order to provide the required intensity of service to ensure the patient's safety. The patient's presenting symptoms, physical exam findings, and initial radiographic and laboratory data in the context of their medical condition is felt to place them at decreased risk for further clinical deterioration. Furthermore, it is anticipated that the patient will be medically stable for discharge from the hospital within 2 midnights of admission. The following factors support the patient status of observation.   " The patient's presenting symptoms include SOB, chest tightness, LE edema. " The physical exam findings include LE edema, tachypnea. " The initial radiographic and laboratory data are elevated D-dimer, elevated troponin, CXR with mild edema versus chronic interstitial lung disease, CTA chest without PE.     Time Spent on Admission: 48 minutes      Melina Schools D.O.  [facility] 10/03/2020, 7:24 PM

## 2020-10-03 NOTE — ED Notes (Signed)
Dr. Juleen China informed that the patient's BP is still elevated 170/103 via Epic secure chat. Awaiting response.

## 2020-10-03 NOTE — ED Provider Notes (Signed)
3:21 PM Care assumed from Dr. Roslynn Amble.  At time of transfer care, patient is waiting for results of CT PE study on the labs to look for etiology of shortness of breath.  Patient also has had elevated blood pressures and was given her blood pressure medicine, dissipate reassessment.  Anticipate determination to see if the patient is hypoxic with ambulation.  Anticipate reassessment after work-up to determine disposition.  6:16 PM Patient's CT PE study does not show evidence of pulmonary ballismus and does show some atelectasis or scar tissue.  Troponin is elevated and slightly rising more than it was earlier this year.  Other labs similar to prior.  BNP not elevated.  On my assessment when I was talking to the patient, she did have oxygen saturations dipped to around 84% on room air.  She is feeling very short of breath.  Blood pressure improved into the 170s after I gave her labetalol.    Given the patient's new hypoxia and hypertensive symptoms, do not feel she is safe for discharge home with her ongoing shortness of breath.  She is now on oxygen, will get COVID swab.  We will call for admission for further blood pressure titration and breathing management.    Clinical Impression: 1. Shortness of breath   2. Hypoxia   3. Elevated blood pressure reading     Disposition: Admit  This note was prepared with assistance of Dragon voice recognition software. Occasional wrong-word or sound-a-like substitutions may have occurred due to the inherent limitations of voice recognition software.     Laurene Melendrez, Gwenyth Allegra, MD 10/04/20 [phone]

## 2020-10-04 ENCOUNTER — Observation Stay (HOSPITAL_COMMUNITY): Payer: Self-pay

## 2020-10-04 ENCOUNTER — Encounter (HOSPITAL_COMMUNITY): Payer: Self-pay | Admitting: Internal Medicine

## 2020-10-04 ENCOUNTER — Other Ambulatory Visit: Payer: Self-pay

## 2020-10-04 DIAGNOSIS — R0602 Shortness of breath: Secondary | ICD-10-CM

## 2020-10-04 DIAGNOSIS — I340 Nonrheumatic mitral (valve) insufficiency: Secondary | ICD-10-CM

## 2020-10-04 DIAGNOSIS — I5021 Acute systolic (congestive) heart failure: Secondary | ICD-10-CM

## 2020-10-04 LAB — ECHOCARDIOGRAM COMPLETE
Area-P 1/2: 6.96 cm2
Calc EF: 27.2 %
Height: 67 in
MV M vel: 5.97 m/s
MV Peak grad: 142.3 mmHg
Radius: 0.6 cm
S' Lateral: 3.6 cm
Single Plane A2C EF: 27.8 %
Single Plane A4C EF: 29.6 %
Weight: 3171.1 oz

## 2020-10-04 LAB — CBC
HCT: 31.9 % — ABNORMAL LOW (ref 36.0–46.0)
Hemoglobin: 9.4 g/dL — ABNORMAL LOW (ref 12.0–15.0)
MCH: 28.4 pg (ref 26.0–34.0)
MCHC: 29.5 g/dL — ABNORMAL LOW (ref 30.0–36.0)
MCV: 96.4 fL (ref 80.0–100.0)
Platelets: 220 10*3/uL (ref 150–400)
RBC: 3.31 MIL/uL — ABNORMAL LOW (ref 3.87–5.11)
RDW: 15.9 % — ABNORMAL HIGH (ref 11.5–15.5)
WBC: 6 10*3/uL (ref 4.0–10.5)
nRBC: 0 % (ref 0.0–0.2)

## 2020-10-04 LAB — BASIC METABOLIC PANEL
Anion gap: 6 (ref 5–15)
BUN: 6 mg/dL — ABNORMAL LOW (ref 8–23)
CO2: 26 mmol/L (ref 22–32)
Calcium: 8.5 mg/dL — ABNORMAL LOW (ref 8.9–10.3)
Chloride: 107 mmol/L (ref 98–111)
Creatinine, Ser: 0.68 mg/dL (ref 0.44–1.00)
GFR, Estimated: >60 mL/min (ref >60)
Glucose, Bld: 124 mg/dL — ABNORMAL HIGH (ref 70–99)
Potassium: 3.4 mmol/L — ABNORMAL LOW (ref 3.5–5.1)
Sodium: 139 mmol/L (ref 135–145)

## 2020-10-04 LAB — HEMOGLOBIN A1C
Hgb A1c MFr Bld: 6.4 % — ABNORMAL HIGH (ref 4.8–5.6)
Mean Plasma Glucose: 136.98 mg/dL

## 2020-10-04 LAB — HIV ANTIBODY (ROUTINE TESTING W REFLEX): HIV Screen 4th Generation wRfx: NONREACTIVE

## 2020-10-04 LAB — GLUCOSE, CAPILLARY: Glucose-Capillary: 98 mg/dL (ref 70–99)

## 2020-10-04 LAB — TROPONIN I (HIGH SENSITIVITY)
Troponin I (High Sensitivity): 70 ng/L — ABNORMAL HIGH (ref <18)
Troponin I (High Sensitivity): 69 ng/L — ABNORMAL HIGH (ref <18)
Troponin I (High Sensitivity): 65 ng/L — ABNORMAL HIGH (ref <18)

## 2020-10-04 LAB — TSH: TSH: 2.281 u[IU]/mL (ref 0.350–4.500)

## 2020-10-04 MED ORDER — SACUBITRIL-VALSARTAN 49-51 MG PO TABS
1.0000 | ORAL_TABLET | Freq: Two times a day (BID) | ORAL | Status: DC
  Administered 2020-10-05 – 2020-10-06 (×3): 1 via ORAL
  Filled 2020-10-04 (×3): qty 1

## 2020-10-04 MED ORDER — ACETAMINOPHEN 325 MG PO TABS
650.0000 mg | ORAL_TABLET | Freq: Four times a day (QID) | ORAL | Status: DC | PRN
  Administered 2020-10-04 – 2020-10-07 (×7): 650 mg via ORAL
  Filled 2020-10-04 (×7): qty 2

## 2020-10-04 MED ORDER — METOPROLOL TARTRATE 12.5 MG HALF TABLET
12.5000 mg | ORAL_TABLET | Freq: Two times a day (BID) | ORAL | Status: DC
  Administered 2020-10-04: 12.5 mg via ORAL
  Filled 2020-10-04: qty 1

## 2020-10-04 MED ORDER — ASPIRIN 81 MG PO CHEW
81.0000 mg | CHEWABLE_TABLET | Freq: Every day | ORAL | Status: DC
  Administered 2020-10-04 – 2020-10-07 (×4): 81 mg via ORAL
  Filled 2020-10-04 (×4): qty 1

## 2020-10-04 MED ORDER — FUROSEMIDE 10 MG/ML IJ SOLN
40.0000 mg | Freq: Once | INTRAMUSCULAR | Status: AC
  Administered 2020-10-04: 40 mg via INTRAVENOUS
  Filled 2020-10-04: qty 4

## 2020-10-04 MED ORDER — CARVEDILOL 6.25 MG PO TABS
6.2500 mg | ORAL_TABLET | Freq: Two times a day (BID) | ORAL | Status: DC
  Administered 2020-10-04 – 2020-10-07 (×6): 6.25 mg via ORAL
  Filled 2020-10-04 (×6): qty 1

## 2020-10-04 MED ORDER — LIVING BETTER WITH HEART FAILURE BOOK: Freq: Once | Status: AC

## 2020-10-04 MED ORDER — POTASSIUM CHLORIDE CRYS ER 20 MEQ PO TBCR
60.0000 meq | EXTENDED_RELEASE_TABLET | Freq: Once | ORAL | Status: AC
  Administered 2020-10-04: 60 meq via ORAL
  Filled 2020-10-04: qty 3

## 2020-10-04 NOTE — Progress Notes (Signed)
Noted referral to check cost for entresto, farxiga, jardiance- pt has no insurance on file.

## 2020-10-04 NOTE — Progress Notes (Signed)
Notified Dr. Tonie Griffith troponin 53. Patient denies chest pain and is resting comfortably. Will recheck troponin.  EKG in AM. Will continue to monitor

## 2020-10-04 NOTE — Progress Notes (Signed)
  Echocardiogram 2D Echocardiogram has been performed.  Marie Church 10/04/2020, 8:54 AM

## 2020-10-04 NOTE — Consult Note (Addendum)
Cardiology Consultation:   Patient ID: Marie Church MRN: [id number]; DOB: [birth year]  Admit date: 10/03/2020 Date of Consult: 10/04/2020  PCP:  System, Provider Not In   [facility] Cardiologist:  [facility]; Dr. Sallyanne Kuster   Patient Profile:   Marie Church is a 65 y.o. female with a PMH of HTN, DM type 2, and metastatic breast cancer currently on neoadjuvant chemotherapy who is being seen 10/04/2020 for the evaluation of CHF at the request of Dr. Dwyane Dee.  History of Present Illness:   Marie Church is visiting her son in [place], though currently resides in [place], [place]. was diagnosed with breast cancer 02/2020. She was found to have metastatic disease to lymph nodes. A multimodal treatment approach was recommended including chemotherapy +/- radiation and likely surgery once tumor size improves. She was started on taxotere, carboplatin, herceptin, and perjeta q3 weeks x6 cycles. She had an echocardiogram 07/2020 for 3 month follow-up while on herceptin chemotherapy which showed EF 62%, normal LV diastolic function, normal RV function, mild MR, mild-moderate TR, and moderate pulmonary HTN. Her last ischemic evaluation appears to be a NST in 2010 which showed no evidence of ischemia.   She was visiting her son in [place] when she began experiencing worsening SOB prompting her to present to the ED for further evaluation. For the past several weeks she has noticed SOB which has progressively worsened and is now experiencing SOB with activity and rest. Additionally she has had swelling in her feet. Of note, she has had 2 ED visits 09/16/20 and 09/23/20 for elevated blood pressures  In the ED, BP was markedly elevated up to 210/119, intermittently tachypneic, intermittnetly tachycardic to the 110s, otherwise afebrile and satting well on RA. Labs notable for K 3.2, Cr 0.69, Hgb 10.8, PLT 250, BNP 97, Hstrop 34>39>70>69>69, TSH wnl, A1C 6.4. EKG showed sinus rhythm with  rate 111 bpm, non-specific T wave abnormalities, no STE/D; no change from previous. CXR showed diffuse interstitial pulmonar opacity similar to previous. CTA chest showed no PE with evidence of parenchymal scarring and atelectasis. She was admitted to medicine. Echocardiogram obtained showing EF 25-30%, moderate concentric LVH, global hypokinesis, mildly reduced RV systolic function, and mild-moderate (likely functional) MR. Her home losartan was continued and she was started on metoprolol tartrate 12.5mg  BID and HCTZ 25mg  daily. Cardiology asked to evaluate.   At the time of this evaluation she reports some improvement in her breathing but is not back to baseline. She still gets quite SOB ambulating in the room. She reports breathing never quite returned to baseline following her admission for PNA 07/2020, though has significantly worsened over the past few weeks. She has had LE edema for the past week. She notes orthopnea without PND. She denies any history of chest pain now or prior to her cancer diagnosis. She has no complaints of dizziness, lightheadedness, syncope, or palpitations. She denies family history of CAD, CHF, or MI. She has been undergoing neoadjuvant chemotherapy since 03/2020 and has now completed 6 cycles of TCR-P as of 2-3 weeks ago. She frequently travels to [place] to visit her son following her chemotherapy treatments as she does not feel comfortable staying at her home alone. I spoke with her daughter, Ronny Bacon, over the phone with the patient to update her to the above findings and plan.    Past Medical History:  Diagnosis Date  . Breast cancer ([place])    undergoing chemothearpy   . DM type 2 (diabetes mellitus, type 2) ([place])  reportedly steroid induced  . Hypertension     History reviewed. No pertinent surgical history.   Home Medications:  Prior to Admission medications   Medication Sig Start Date End Date Taking? Authorizing Provider  losartan (COZAAR) 50 MG tablet  Take 1 tablet (50 mg total) by mouth daily. 09/23/20  Yes Pollina, Gwenyth Allegra, MD    Inpatient Medications: Scheduled Meds: . aspirin  81 mg Oral Daily  . carvedilol  6.25 mg Oral BID WC  . enoxaparin (LOVENOX) injection  40 mg Subcutaneous Q24H  . furosemide  40 mg Intravenous Once  . potassium chloride  60 mEq Oral Once  . [START ON 10/05/2020] sacubitril-valsartan  1 tablet Oral BID  . sodium chloride flush  3 mL Intravenous Q12H   Continuous Infusions: . sodium chloride     PRN Meds: sodium chloride, acetaminophen, labetalol, sodium chloride flush  Allergies:    Allergies  Allergen Reactions  . Doxycycline Rash  . Amoxicillin-Pot Clavulanate Rash  . Lisinopril Rash    Social History:   Social History   Socioeconomic History  . Marital status: Single    Spouse name: Not on file  . Number of children: Not on file  . Years of education: Not on file  . Highest education level: Not on file  Occupational History  . Not on file  Tobacco Use  . Smoking status: Never Smoker  . Smokeless tobacco: Never Used  Vaping Use  . Vaping Use: Never used  Substance and Sexual Activity  . Alcohol use: Never  . Drug use: Never  . Sexual activity: Not on file  Other Topics Concern  . Not on file  Social History Narrative  . Not on file   Social Determinants of Health   Financial Resource Strain: Not on file  Food Insecurity: Not on file  Transportation Needs: Not on file  Physical Activity: Not on file  Stress: Not on file  Social Connections: Not on file  Intimate Partner Violence: Not on file    Family History:   Family History  Problem Relation Age of Onset  . Heart failure Neg Hx   . Heart disease Neg Hx   . Heart attack Neg Hx      ROS:  Please see the history of present illness.   All other ROS reviewed and negative.     Physical Exam/Data:   Vitals:   10/04/20 0934 10/04/20 0939 10/04/20 1145 10/04/20 1508  BP: (!) 168/114 (!) 164/95 (!) 152/70  140/87  Pulse: (!) 116 (!) 112 (!) 111 89  Resp: 20 20 20 18   Temp: 99.1 F (37.3 C)  98.6 F (37 C) 99 F (37.2 C)  TempSrc: Oral  Oral Oral  SpO2: 100% 100% 100% 100%  Weight:      Height:        Intake/Output Summary (Last 24 hours) at 10/04/2020 1614 Last data filed at 10/04/2020 0830 Gross per 24 hour  Intake 240 ml  Output --  Net 240 ml   Last 3 Weights 10/04/2020  Weight (lbs) 198 lb 3.1 oz  Weight (kg) 89.9 kg     Body mass index is 31.04 kg/m.  General:  Well nourished, well developed, in no acute distress HEENT: sclera anicteric Neck: no JVD Vascular: No carotid bruits; distal pulses 2+ bilaterally  Cardiac:  normal S1, S2; RRR; no murmurs, rubs, or gallops Lungs:  Faint crackles at lung bases otherwise no wheezing/rhonchi  Abd: soft, nontender, no hepatomegaly  Ext: 1+  LE edema Musculoskeletal:  No deformities, BUE and BLE strength normal and equal Skin: warm and dry  Neuro:  CNs 2-12 intact, no focal abnormalities noted Psych:  Somewhat depressed mood   EKG:  The EKG was personally reviewed and demonstrates:  sinus rhythm with rate 111 bpm, non-specific T wave abnormalities, no STE/D; no change from previous. Telemetry:  Telemetry was personally reviewed and demonstrates:  Sinus rhythm with 1 brief run of SVT (5 beats)  Relevant CV Studies: Echocardiogram 07/22/20: 1. Normal left ventricular cavity size. Normal left ventricular systolic  function. LV Ejection Fraction is approximately: 62 %.  2. Left ventricular diastolic parameters are consistent with normal diastolic  function.  3. Normal right ventricular systolic function.  4. The left atrium is normal in size.  5. Mild mitral valve regurgitation.  6. Mild to moderate tricuspid regurgitation.  7. Estimated pulmonary artery systolic pressure is consistent with moderate  pulmonary hypertension (45-62mmHg).  8. The aortic root is normal in size. The ascending aorta is normal in size.    Echocardiogram  10/04/20: 1. Left ventricular ejection fraction, by estimation, is 25 to 30%. Left  ventricular ejection fraction by 3D volume is 28 %. The left ventricle has  severely decreased function. The left ventricle demonstrates global  hypokinesis. There is moderate  concentric left ventricular hypertrophy. Indeterminate diastolic filling  due to E-A fusion. The average left ventricular global longitudinal strain  is -10.3 %. The global longitudinal strain is abnormal.   2. Right ventricular systolic function is mildly reduced. The right  ventricular size is normal.   3. The mitral valve is normal in structure. There is mild-to-moderate  posterolaterally directed mitral regurgitation based on quantitative  assessment with EROA 0.15; RVol 27. Based on visual assesment, appears  more moderate. Mechanism appears  functional.   4. The aortic valve is tricuspid. There is mild calcification of the  aortic valve. There is mild thickening of the aortic valve. Aortic valve  regurgitation is not visualized. Mild aortic valve sclerosis is present,  with no evidence of aortic valve  stenosis.   5. The inferior vena cava is dilated in size with <50% respiratory  variability, suggesting right atrial pressure of 15 mmHg.   Laboratory Data:  High Sensitivity Troponin:   Recent Labs  Lab 10/03/20 1353 10/03/20 1615 10/04/20 0150 10/04/20 0526 10/04/20 0744  TROPONINIHS 34* 39* 70* 69* 65*     Chemistry Recent Labs  Lab 10/03/20 1353 10/04/20 0150  NA 142 139  K 3.2* 3.4*  CL 107 107  CO2 28 26  GLUCOSE 117* 124*  BUN 7* 6*  CREATININE 0.69 0.68  CALCIUM 8.9 8.5*  GFRNONAA >60 >60  ANIONGAP 7 6    No results for input(s): PROT, ALBUMIN, AST, ALT, ALKPHOS, BILITOT in the last 168 hours. Hematology Recent Labs  Lab 10/03/20 1353 10/04/20 0150  WBC 5.7 6.0  RBC 3.76* 3.31*  HGB 10.8* 9.4*  HCT 35.8* 31.9*  MCV 95.2 96.4  MCH 28.7 28.4  MCHC 30.2 29.5*  RDW 15.7* 15.9*  PLT 250 220    BNP Recent Labs  Lab 10/03/20 1353  BNP 97.9    DDimer  Recent Labs  Lab 10/03/20 1426  DDIMER 1.32*     Radiology/Studies:  DG Chest 2 View  Result Date: 10/03/2020 CLINICAL DATA:  Shortness of breath, hypertension EXAM: CHEST - 2 VIEW COMPARISON:  09/16/2020 FINDINGS: The heart size and mediastinal contours are within normal limits. Left chest port catheter. Mild, diffuse  interstitial pulmonary opacity, similar to prior examination. The visualized skeletal structures are unremarkable. IMPRESSION: Mild, diffuse interstitial pulmonary opacity, similar to prior examination and may reflect mild edema and/or chronic interstitial change. No new or focal airspace opacity. Electronically Signed   By: Eddie Candle M.D.   On: 10/03/2020 12:13   CT Angio Chest PE W and/or Wo Contrast  Result Date: 10/03/2020 CLINICAL DATA:  Positive D-dimer, shortness of breath EXAM: CT ANGIOGRAPHY CHEST WITH CONTRAST TECHNIQUE: Multidetector CT imaging of the chest was performed using the standard protocol during bolus administration of intravenous contrast. Multiplanar CT image reconstructions and MIPs were obtained to evaluate the vascular anatomy. CONTRAST:  55mL OMNIPAQUE IOHEXOL 350 MG/ML SOLN COMPARISON:  09/17/2020 FINDINGS: Cardiovascular:  Left port catheter tubing mid SVC level. Tortuosity of the major branch vessels. Negative for aneurysm or dissection. No mediastinal hemorrhage or hematoma. Pulmonary arteries appear patent. No significant filling defect or pulmonary embolus by CTA. Normal heart size.  No pericardial effusion. Mediastinum/Nodes: Stable bilateral axillary adenopathy. No significant mediastinal hilar adenopathy. Thyroid unremarkable. Trachea central airways are patent. Esophagus nondilated. No hiatal hernia. Lungs/Pleura: Anterior right middle lobe and lingula scarring. Low lung volumes with hypoventilatory changes noted. Minor basilar atelectasis. No acute airspace process, pneumonia or  consolidation. No interstitial process or edema. No pleural abnormality, effusion or pneumothorax. Upper Abdomen: No acute abnormality. Musculoskeletal: No acute osseous finding or compression fracture. Sternum intact. Review of the MIP images confirms the above findings. IMPRESSION: Negative for significant acute pulmonary embolus by CTA. Overall stable exam. Low lung volumes with areas of parenchymal scarring and atelectasis. No acute airspace process Similar bilateral axillary adenopathy. Electronically Signed   By: Jerilynn Mages.  Shick M.D.   On: 10/03/2020 16:22   ECHOCARDIOGRAM COMPLETE  Result Date: 10/04/2020    ECHOCARDIOGRAM REPORT   Patient Name:   Marie Church Date of Exam: 10/04/2020 Medical Rec #:  [id number]        Height:       67.0 in Accession #:    [id number]       Weight:       198.2 lb Date of Birth:  [birth year]        BSA:          2.014 m Patient Age:    33 years         BP:           172/119 mmHg Patient Gender: F                HR:           104 bpm. Exam Location:  Inpatient Procedure: 2D Echo, 3D Echo, Cardiac Doppler, Color Doppler and Strain Analysis Indications:    R06.02 SOB  History:        Patient has no prior history of Echocardiogram examinations.                 Signs/Symptoms:Dizziness/Lightheadedness, Shortness of Breath                 and Dyspnea; Risk Factors:Diabetes and Hypertension. Breast                 cancer. Port. Hypoxia.  Sonographer:    Roseanna Rainbow RDCS Referring Phys: [id number] Nicolette Bang  Sonographer Comments: Patient is morbidly obese. Image acquisition challenging due to patient body habitus. Global longitudinal strain was attempted. IMPRESSIONS  1. Left ventricular ejection fraction, by estimation, is 25 to 30%. Left ventricular ejection fraction by 3D  volume is 28 %. The left ventricle has severely decreased function. The left ventricle demonstrates global hypokinesis. There is moderate concentric left ventricular hypertrophy. Indeterminate diastolic  filling due to E-A fusion. The average left ventricular global longitudinal strain is -10.3 %. The global longitudinal strain is abnormal.  2. Right ventricular systolic function is mildly reduced. The right ventricular size is normal.  3. The mitral valve is normal in structure. There is mild-to-moderate posterolaterally directed mitral regurgitation based on quantitative assessment with EROA 0.15; RVol 27. Based on visual assesment, appears more moderate. Mechanism appears functional.  4. The aortic valve is tricuspid. There is mild calcification of the aortic valve. There is mild thickening of the aortic valve. Aortic valve regurgitation is not visualized. Mild aortic valve sclerosis is present, with no evidence of aortic valve stenosis.  5. The inferior vena cava is dilated in size with <50% respiratory variability, suggesting right atrial pressure of 15 mmHg. Comparison(s): No prior Echocardiogram. FINDINGS  Left Ventricle: No LV trhomus visualized. Left ventricular ejection fraction, by estimation, is 25 to 30%. Left ventricular ejection fraction by 3D volume is 28 %. The left ventricle has severely decreased function. The left ventricle demonstrates global hypokinesis. The average left ventricular global longitudinal strain is -10.3 %. The global longitudinal strain is abnormal. The left ventricular internal cavity size was normal in size. There is moderate concentric left ventricular hypertrophy. Indeterminate diastolic filling due to E-A fusion. Right Ventricle: The right ventricular size is normal. No increase in right ventricular wall thickness. Right ventricular systolic function is mildly reduced. Left Atrium: Left atrial size was normal in size. Right Atrium: Right atrial size was normal in size. Pericardium: There is no evidence of pericardial effusion. Mitral Valve: The mitral valve is normal in structure. There is mild thickening of the mitral valve leaflet(s). There is mild calcification of the  mitral valve leaflet(s). Mild mitral annular calcification. There is mild-to-moderate mitral regurgitation based on quantitative assessment with EROA 0.15; RVol 27. Based on visual assesment, appears more moderate. mitral valve regurgitation. Tricuspid Valve: The tricuspid valve is normal in structure. Tricuspid valve regurgitation is trivial. Aortic Valve: The aortic valve is tricuspid. There is mild calcification of the aortic valve. There is mild thickening of the aortic valve. Aortic valve regurgitation is not visualized. Mild aortic valve sclerosis is present, with no evidence of aortic valve stenosis. Pulmonic Valve: The pulmonic valve was normal in structure. Pulmonic valve regurgitation is trivial. Aorta: The aortic root and ascending aorta are structurally normal, with no evidence of dilitation. Venous: The inferior vena cava is dilated in size with less than 50% respiratory variability, suggesting right atrial pressure of 15 mmHg. IAS/Shunts: No atrial level shunt detected by color flow Doppler.  LEFT VENTRICLE PLAX 2D LVIDd:         4.10 cm         Diastology LVIDs:         3.60 cm         LV e' medial:    6.85 cm/s LV PW:         1.80 cm         LV E/e' medial:  7.6 LV IVS:        1.40 cm         LV e' lateral:   6.42 cm/s LVOT diam:     1.90 cm         LV E/e' lateral: 8.1 LV SV:  49 LV SV Index:   24              2D LVOT Area:     2.84 cm        Longitudinal                                Strain                                2D Strain GLS  -10.3 % LV Volumes (MOD)               Avg: LV vol d, MOD    98.7 ml A2C:                           3D Volume EF LV vol d, MOD    80.4 ml       LV 3D EF:    Left A4C:                                        ventricular LV vol s, MOD    71.3 ml                    ejection A2C:                                        fraction by LV vol s, MOD    56.6 ml                    3D volume A4C:                                        is 28 %. LV SV MOD A2C:   27.4 ml LV  SV MOD A4C:   80.4 ml LV SV MOD BP:    24.2 ml       3D Volume EF:                                3D EF:        28 % RIGHT VENTRICLE            IVC RV S prime:     8.59 cm/s  IVC diam: 2.50 cm TAPSE (M-mode): 1.3 cm LEFT ATRIUM             Index       RIGHT ATRIUM          Index LA diam:        3.80 cm 1.89 cm/m  RA Area:     8.20 cm LA Vol (A2C):   44.3 ml 21.99 ml/m RA Volume:   13.40 ml 6.65 ml/m LA Vol (A4C):   38.2 ml 18.96 ml/m LA Biplane Vol: 42.2 ml 20.95 ml/m  AORTIC VALVE             PULMONIC VALVE LVOT Vmax:   113.00 cm/s PR End Diast Vel: 1.95 msec LVOT Vmean:  74.900  cm/s LVOT VTI:    0.173 m  AORTA Ao Root diam: 2.70 cm Ao Asc diam:  3.20 cm MITRAL VALVE MV Area (PHT): 6.96 cm      SHUNTS MV Decel Time: 109 msec      Systemic VTI:  0.17 m MR Peak grad:    142.3 mmHg  Systemic Diam: 1.90 cm MR Mean grad:    81.0 mmHg MR Vmax:         596.50 cm/s MR Vmean:        412.5 cm/s MR PISA:         2.26 cm MR PISA Eff ROA: 15 mm MR PISA Radius:  0.60 cm MV E velocity: 52.21 cm/s MV A velocity: 127.00 cm/s MV E/A ratio:  0.41 Gwyndolyn Kaufman MD Electronically signed by Gwyndolyn Kaufman MD Signature Date/Time: 10/04/2020/11:15:05 AM    Final      Assessment and Plan:   1. Acute combined CHF: patient presented with progressive SOB and LE edema over the past few weeks, acutely worsening overnight. No prior history of this. She is currently undergoing chemotherapy for management of her metastatic breast cancer with chemotherapy regimen including herceptin. Every 3 month surveillance echo's have been monitored since starting chemo with last echo 07/2020 showed EF 62%. Echo this admission showed EF 25-30% with mildly reduced RV systolic function and mild-moderate, likely functional, MR. BNP was wnl and CXR/CT not suggestive of volume overload. She does have 1+ LE edema and faint crackles at lung bases on exam today. 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 Thankfully we have quite a bit of blood  pressure to work with and good kidney function - Will give one dose of IV lasix 40mg  and monitor for response - Will transition from losartan to [employer] tomorrow - will ask case management to look into cost - Will transition from metoprolol to carvedilol - Titrate above to max dose tolerated - She does not appear volume overloaded so will hold HCTZ and monitor response above - Continue to monitor strict I&Os and daily weights - Continue to monitor electrolytes closely and replete as needed to maintain K >4, Mg >2 - will give 60 mEq K now - Living with Heart Failure book has been ordered. - Dr. Loletha Grayer to communicate with patient's oncologist, Dr. Lorrine Kin to relay above findings.   2. Elevated troponin in patient with no known CAD: HsTrop 34>39>70>69>65 - low flat trend not c/w ACS. EKG non-ischemic. Suspect demand ischemia in the setting of #1. No recent ischemic testing. She has not had any chest pain complaints. There is no mention of coronary artery calcifications on review of several CT scans. Risk factors for CAD include HTN and DM type 2. Echo with EF 25-30% this admission. Suspect this is likely 2/2 her chemotherapy rather than ischemic etiology - Will check FLP in AM for risk stratification - Favor continuing GDMT x3 months and if EF not improved, would consider ischemic testing at that time given lack of chest pain.   3. HTN: BP markedly elevated this admission - To be managed in the context of #1  4. DM type 2: A1C 6.8. Patient is not on any medications prior to admission She tells me this was steroid induced and she has been off steroids since 07/2020 - Repeat A1C ordered - Continue management per primary team - Can avoid SGLT2-inhibitor at this time as she is likely to make a quick recovery of her EF with above management.    Risk Assessment/Risk Scores:  New York Heart Association (NYHA) Functional Class NYHA Class III        For questions or updates, please contact [facility] Please consult [email or web address] for contact info under    Signed, Abigail Butts, PA-C  10/04/2020 4:14 PM   I have seen and examined the patient along with Abigail Butts, PA-C .  I have reviewed the chart, notes and new data.  I agree with PA/NP's note.  Key new complaints: dyspnea better, but still on O2 and mildly orthopneic. Very tired. Key examination changes: JVP 7 cm, clear lungs, tachycardia, S4 versus  summation gallop?, no edema Key new findings / data: marginal increase in hs-troponin, adynamic, Hgb 9.4, creat 0.68. ECG normal other than sinus tachycardia. CHF on CXR. Echo shows LVH and global LV hypokinesis, EF 25-30%, dilated VC. MR is moderate, central.  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 Still mildy hypervolemic by exam (at least the right heart pressures are still elevated). BNP surprisingly low. May not need much more diuresis. Should be a reversible condition. She has completed her herceptin neoadjuvant therapy anyway, I think.  Herceptin should not be restarted. May have to delay her surgery a bit while heart recovers. Transition to [facility] and carvedilol in maximum tolerated doses. A small daily dose of loop diuretic; add spironolactone if BP permits. Recheck EF in 2-3 months. Discussed dietary sodium restriction, daily weight monitoring, signs and symptoms of CHF exacerbation.  Sanda Klein, MD, [place] [phone] 10/04/2020, 4:29 PM

## 2020-10-04 NOTE — Progress Notes (Signed)
Patient arrived from ED via stretcher; patient oriented to room; CCMD notified; call bell within reach

## 2020-10-04 NOTE — Plan of Care (Signed)
  Problem: Health Behavior/Discharge Planning: Goal: Ability to manage health-related needs will improve Outcome: Progressing   Problem: Clinical Measurements: Goal: Will remain free from infection Outcome: Progressing Goal: Diagnostic test results will improve Outcome: Progressing   

## 2020-10-04 NOTE — Progress Notes (Signed)
PROGRESS NOTE    Marie Church  [id number] DOB: 03/11/1956 DOA: 10/03/2020 PCP: System, Provider Not In   Brief Narrative:  This 65 years old female with PMH significant for HTN, anemia, breast cancer, type 2 diabetes presented to ED with 2-week history of progressive worsening shortness of breath and chest discomfort.  She also reported bilateral feet swelling for last 2 weeks.  Patient is originally from [place] and visiting [place]. EKG shows sinus tachycardia but no ischemic changes.  CT chest ruled out PE.  Troponins were slightly elevated and trending up.  She was also found to be hypoxic requiring 2 L of oxygen.  Echocardiogram completed shows EF 20 to 25%.  Cardiology consulted awaiting recommendation.  Assessment & Plan:   Active Problems:   Anemia of chronic disease   Macrocytic anemia   Breast cancer ([place])   Type 2 diabetes mellitus without complication, without long-term current use of insulin (HCC)   Hypoxia   Hypertensive urgency   Shortness of breath   Elevated troponin  Acute hypoxic respiratory failure: Patient presented in the ED with shortness of breath,  found to have O2 sat 84% on room air. She was placed on 2 L O2 saturation improved to 100%. She continued to remain tachypneic and tachycardic, BNP is normal, D-dimer slightly elevated.  CTA ruled out PE. Chest x-ray was concerning for some mild edema and chronic interstitial changes Patient does not have any signs,  concerning for bacterial pneumonia. Continue supplemental oxygen to maintain saturation above 92%. Obtain 2D echocardiogram. Troponin slightly elevated and trending up. Cardiology consulted.  Awaiting recommendation.  Hypertensive Urgency : Patient reports she was recently started on losartan and has been taking it regularly. She is found to have BP 160-180/100-110 in the ED. Continue home losartan, add hydrochlorothiazide. Start metoprolol 12.5 mg q12hr   Elevated Troponin  May  be related to demand ischemia from hypoxia and poorly controlled HTN.  Mildly elevated with result of 34>39>70>69>65 EKG without STEMI and unchanged from prior.  Continue telemetry, continue to trend troponin.  Normocytic Anemia  Stable with HgB 10.8. Chronic.  -monitor CBC  Hypokalemia  Replacement in progress.   Recheck labs.   T2DM  Continue sliding scale. Hold home medications.    DVT prophylaxis: Lovenox Code Status: Full code. Family Communication: No family at bed side. Disposition Plan:    Status is: Observation  The patient remains OBS appropriate and will d/c before 2 midnights.  Dispo: The patient is from: Home              Anticipated d/c is to: Home              Patient currently is not medically stable to d/c.   Difficult to place patient No  Consultants:   Cardiology  Procedures: Echocardiogram. Antimicrobials:  Anti-infectives (From admission, onward)   None      Subjective: Patient was seen and examined at bedside.  Overnight events noted.   Patient reports still feeling short of breath,  she is on 2 L saturation is 100%.  Objective: Vitals:   10/04/20 0400 10/04/20 0934 10/04/20 0939 10/04/20 1145  BP: (!) 172/119 (!) 168/114 (!) 164/95 (!) 152/70  Pulse: (!) 109 (!) 116 (!) 112 (!) 111  Resp: (!) 24 20 20 20   Temp: 98.8 F (37.1 C) 99.1 F (37.3 C)  98.6 F (37 C)  TempSrc: Oral Oral  Oral  SpO2: 100% 100% 100% 100%  Weight:      Height:  Intake/Output Summary (Last 24 hours) at 10/04/2020 1502 Last data filed at 10/04/2020 0830 Gross per 24 hour  Intake 240 ml  Output --  Net 240 ml   Filed Weights   10/04/20 0027  Weight: 89.9 kg    Examination:  General exam: Appears calm and comfortable, not in any acute distress. Respiratory system: Clear to auscultation. Respiratory effort normal. Cardiovascular system: S1 & S2 heard, RRR. No JVD, murmurs, rubs, gallops or clicks. No pedal edema. Gastrointestinal system:  Abdomen is nondistended, soft and nontender. No organomegaly or masses felt. Normal bowel sounds heard. Central nervous system: Alert and oriented. No focal neurological deficits. Extremities: Symmetric 5 x 5 power.  2+ pitting edema noted. Skin: No rashes, lesions or ulcers Psychiatry: Judgement and insight appear normal. Mood & affect appropriate.     Data Reviewed: I have personally reviewed following labs and imaging studies  CBC: Recent Labs  Lab 10/03/20 1353 10/04/20 0150  WBC 5.7 6.0  NEUTROABS 2.7  --   HGB 10.8* 9.4*  HCT 35.8* 31.9*  MCV 95.2 96.4  PLT 250 425   Basic Metabolic Panel: Recent Labs  Lab 10/03/20 1353 10/04/20 0150  NA 142 139  K 3.2* 3.4*  CL 107 107  CO2 28 26  GLUCOSE 117* 124*  BUN 7* 6*  CREATININE 0.69 0.68  CALCIUM 8.9 8.5*   GFR: Estimated Creatinine Clearance: 82.8 mL/min (by C-G formula based on SCr of 0.68 mg/dL). Liver Function Tests: No results for input(s): AST, ALT, ALKPHOS, BILITOT, PROT, ALBUMIN in the last 168 hours. No results for input(s): LIPASE, AMYLASE in the last 168 hours. No results for input(s): AMMONIA in the last 168 hours. Coagulation Profile: No results for input(s): INR, PROTIME in the last 168 hours. Cardiac Enzymes: No results for input(s): CKTOTAL, CKMB, CKMBINDEX, TROPONINI in the last 168 hours. BNP (last 3 results) No results for input(s): PROBNP in the last 8760 hours. HbA1C: Recent Labs    10/04/20 0150  HGBA1C 6.4*   CBG: Recent Labs  Lab 10/03/20 1242 10/04/20 0617  GLUCAP 153* 98   Lipid Profile: No results for input(s): CHOL, HDL, LDLCALC, TRIG, CHOLHDL, LDLDIRECT in the last 72 hours. Thyroid Function Tests: Recent Labs    10/04/20 0150  TSH 2.281   Anemia Panel: No results for input(s): VITAMINB12, FOLATE, FERRITIN, TIBC, IRON, RETICCTPCT in the last 72 hours. Sepsis Labs: No results for input(s): PROCALCITON, LATICACIDVEN in the last 168 hours.  Recent Results (from the  past 240 hour(s))  Resp Panel by RT-PCR (Flu A&B, Covid) Nasopharyngeal Swab     Status: None   Collection Time: 10/03/20  6:28 PM   Specimen: Nasopharyngeal Swab; Nasopharyngeal(NP) swabs in vial transport medium  Result Value Ref Range Status   SARS Coronavirus 2 by RT PCR NEGATIVE NEGATIVE Final    Comment: (NOTE) SARS-CoV-2 target nucleic acids are NOT DETECTED.  The SARS-CoV-2 RNA is generally detectable in upper respiratory specimens during the acute phase of infection. The lowest concentration of SARS-CoV-2 viral copies this assay can detect is 138 copies/mL. A negative result does not preclude SARS-Cov-2 infection and should not be used as the sole basis for treatment or other patient management decisions. A negative result may occur with  improper specimen collection/handling, submission of specimen other than nasopharyngeal swab, presence of viral mutation(s) within the areas targeted by this assay, and inadequate number of viral copies(<138 copies/mL). A negative result must be combined with clinical observations, patient history, and epidemiological information. The expected  result is Negative.  Fact Sheet for Patients:  EntrepreneurPulse.com.au  Fact Sheet for Healthcare Providers:  IncredibleEmployment.be  This test is no t yet approved or cleared by the [place] FDA and  has been authorized for detection and/or diagnosis of SARS-CoV-2 by FDA under an Emergency Use Authorization (EUA). This EUA will remain  in effect (meaning this test can be used) for the duration of the COVID-19 declaration under Section 564(b)(1) of the Act, 21 U.S.C.section 360bbb-3(b)(1), unless the authorization is terminated  or revoked sooner.       Influenza A by PCR NEGATIVE NEGATIVE Final   Influenza B by PCR NEGATIVE NEGATIVE Final    Comment: (NOTE) The Xpert Xpress SARS-CoV-2/FLU/RSV plus assay is intended as an aid in the diagnosis of  influenza from Nasopharyngeal swab specimens and should not be used as a sole basis for treatment. Nasal washings and aspirates are unacceptable for Xpert Xpress SARS-CoV-2/FLU/RSV testing.  Fact Sheet for Patients: EntrepreneurPulse.com.au  Fact Sheet for Healthcare Providers: IncredibleEmployment.be  This test is not yet approved or cleared by the [place] FDA and has been authorized for detection and/or diagnosis of SARS-CoV-2 by FDA under an Emergency Use Authorization (EUA). This EUA will remain in effect (meaning this test can be used) for the duration of the COVID-19 declaration under Section 564(b)(1) of the Act, 21 U.S.C. section 360bbb-3(b)(1), unless the authorization is terminated or revoked.  Performed at [facility], [place] [address] [place], [place] [address]     Radiology Studies: DG Chest 2 View  Result Date: 10/03/2020 CLINICAL DATA:  Shortness of breath, hypertension EXAM: CHEST - 2 VIEW COMPARISON:  09/16/2020 FINDINGS: The heart size and mediastinal contours are within normal limits. Left chest port catheter. Mild, diffuse interstitial pulmonary opacity, similar to prior examination. The visualized skeletal structures are unremarkable. IMPRESSION: Mild, diffuse interstitial pulmonary opacity, similar to prior examination and may reflect mild edema and/or chronic interstitial change. No new or focal airspace opacity. Electronically Signed   By: Eddie Candle M.D.   On: 10/03/2020 12:13   CT Angio Chest PE W and/or Wo Contrast  Result Date: 10/03/2020 CLINICAL DATA:  Positive D-dimer, shortness of breath EXAM: CT ANGIOGRAPHY CHEST WITH CONTRAST TECHNIQUE: Multidetector CT imaging of the chest was performed using the standard protocol during bolus administration of intravenous contrast. Multiplanar CT image reconstructions and MIPs were obtained to evaluate the vascular anatomy. CONTRAST:  63mL OMNIPAQUE IOHEXOL 350 MG/ML  SOLN COMPARISON:  09/17/2020 FINDINGS: Cardiovascular:  Left port catheter tubing mid SVC level. Tortuosity of the major branch vessels. Negative for aneurysm or dissection. No mediastinal hemorrhage or hematoma. Pulmonary arteries appear patent. No significant filling defect or pulmonary embolus by CTA. Normal heart size.  No pericardial effusion. Mediastinum/Nodes: Stable bilateral axillary adenopathy. No significant mediastinal hilar adenopathy. Thyroid unremarkable. Trachea central airways are patent. Esophagus nondilated. No hiatal hernia. Lungs/Pleura: Anterior right middle lobe and lingula scarring. Low lung volumes with hypoventilatory changes noted. Minor basilar atelectasis. No acute airspace process, pneumonia or consolidation. No interstitial process or edema. No pleural abnormality, effusion or pneumothorax. Upper Abdomen: No acute abnormality. Musculoskeletal: No acute osseous finding or compression fracture. Sternum intact. Review of the MIP images confirms the above findings. IMPRESSION: Negative for significant acute pulmonary embolus by CTA. Overall stable exam. Low lung volumes with areas of parenchymal scarring and atelectasis. No acute airspace process Similar bilateral axillary adenopathy. Electronically Signed   By: Jerilynn Mages.  Shick M.D.   On: 10/03/2020 16:22   ECHOCARDIOGRAM  COMPLETE  Result Date: 10/04/2020    ECHOCARDIOGRAM REPORT   Patient Name:   SHANY CABACUNGAN Date of Exam: 10/04/2020 Medical Rec #:  [id number]        Height:       67.0 in Accession #:    [id number]       Weight:       198.2 lb Date of Birth:  [birth year]        BSA:          2.014 m Patient Age:    29 years         BP:           172/119 mmHg Patient Gender: F                HR:           104 bpm. Exam Location:  Inpatient Procedure: 2D Echo, 3D Echo, Cardiac Doppler, Color Doppler and Strain Analysis Indications:    R06.02 SOB  History:        Patient has no prior history of Echocardiogram examinations.                  Signs/Symptoms:Dizziness/Lightheadedness, Shortness of Breath                 and Dyspnea; Risk Factors:Diabetes and Hypertension. Breast                 cancer. Port. Hypoxia.  Sonographer:    Roseanna Rainbow RDCS Referring Phys: [id number] Nicolette Bang  Sonographer Comments: Patient is morbidly obese. Image acquisition challenging due to patient body habitus. Global longitudinal strain was attempted. IMPRESSIONS  1. Left ventricular ejection fraction, by estimation, is 25 to 30%. Left ventricular ejection fraction by 3D volume is 28 %. The left ventricle has severely decreased function. The left ventricle demonstrates global hypokinesis. There is moderate concentric left ventricular hypertrophy. Indeterminate diastolic filling due to E-A fusion. The average left ventricular global longitudinal strain is -10.3 %. The global longitudinal strain is abnormal.  2. Right ventricular systolic function is mildly reduced. The right ventricular size is normal.  3. The mitral valve is normal in structure. There is mild-to-moderate posterolaterally directed mitral regurgitation based on quantitative assessment with EROA 0.15; RVol 27. Based on visual assesment, appears more moderate. Mechanism appears functional.  4. The aortic valve is tricuspid. There is mild calcification of the aortic valve. There is mild thickening of the aortic valve. Aortic valve regurgitation is not visualized. Mild aortic valve sclerosis is present, with no evidence of aortic valve stenosis.  5. The inferior vena cava is dilated in size with <50% respiratory variability, suggesting right atrial pressure of 15 mmHg. Comparison(s): No prior Echocardiogram. FINDINGS  Left Ventricle: No LV trhomus visualized. Left ventricular ejection fraction, by estimation, is 25 to 30%. Left ventricular ejection fraction by 3D volume is 28 %. The left ventricle has severely decreased function. The left ventricle demonstrates global hypokinesis. The average left  ventricular global longitudinal strain is -10.3 %. The global longitudinal strain is abnormal. The left ventricular internal cavity size was normal in size. There is moderate concentric left ventricular hypertrophy. Indeterminate diastolic filling due to E-A fusion. Right Ventricle: The right ventricular size is normal. No increase in right ventricular wall thickness. Right ventricular systolic function is mildly reduced. Left Atrium: Left atrial size was normal in size. Right Atrium: Right atrial size was normal in size. Pericardium: There is no evidence of pericardial effusion. Mitral Valve: The mitral  valve is normal in structure. There is mild thickening of the mitral valve leaflet(s). There is mild calcification of the mitral valve leaflet(s). Mild mitral annular calcification. There is mild-to-moderate mitral regurgitation based on quantitative assessment with EROA 0.15; RVol 27. Based on visual assesment, appears more moderate. mitral valve regurgitation. Tricuspid Valve: The tricuspid valve is normal in structure. Tricuspid valve regurgitation is trivial. Aortic Valve: The aortic valve is tricuspid. There is mild calcification of the aortic valve. There is mild thickening of the aortic valve. Aortic valve regurgitation is not visualized. Mild aortic valve sclerosis is present, with no evidence of aortic valve stenosis. Pulmonic Valve: The pulmonic valve was normal in structure. Pulmonic valve regurgitation is trivial. Aorta: The aortic root and ascending aorta are structurally normal, with no evidence of dilitation. Venous: The inferior vena cava is dilated in size with less than 50% respiratory variability, suggesting right atrial pressure of 15 mmHg. IAS/Shunts: No atrial level shunt detected by color flow Doppler.  LEFT VENTRICLE PLAX 2D LVIDd:         4.10 cm         Diastology LVIDs:         3.60 cm         LV e' medial:    6.85 cm/s LV PW:         1.80 cm         LV E/e' medial:  7.6 LV IVS:         1.40 cm         LV e' lateral:   6.42 cm/s LVOT diam:     1.90 cm         LV E/e' lateral: 8.1 LV SV:         49 LV SV Index:   24              2D LVOT Area:     2.84 cm        Longitudinal                                Strain                                2D Strain GLS  -10.3 % LV Volumes (MOD)               Avg: LV vol d, MOD    98.7 ml A2C:                           3D Volume EF LV vol d, MOD    80.4 ml       LV 3D EF:    Left A4C:                                        ventricular LV vol s, MOD    71.3 ml                    ejection A2C:                                        fraction by LV  vol s, MOD    56.6 ml                    3D volume A4C:                                        is 28 %. LV SV MOD A2C:   27.4 ml LV SV MOD A4C:   80.4 ml LV SV MOD BP:    24.2 ml       3D Volume EF:                                3D EF:        28 % RIGHT VENTRICLE            IVC RV S prime:     8.59 cm/s  IVC diam: 2.50 cm TAPSE (M-mode): 1.3 cm LEFT ATRIUM             Index       RIGHT ATRIUM          Index LA diam:        3.80 cm 1.89 cm/m  RA Area:     8.20 cm LA Vol (A2C):   44.3 ml 21.99 ml/m RA Volume:   13.40 ml 6.65 ml/m LA Vol (A4C):   38.2 ml 18.96 ml/m LA Biplane Vol: 42.2 ml 20.95 ml/m  AORTIC VALVE             PULMONIC VALVE LVOT Vmax:   113.00 cm/s PR End Diast Vel: 1.95 msec LVOT Vmean:  74.900 cm/s LVOT VTI:    0.173 m  AORTA Ao Root diam: 2.70 cm Ao Asc diam:  3.20 cm MITRAL VALVE MV Area (PHT): 6.96 cm      SHUNTS MV Decel Time: 109 msec      Systemic VTI:  0.17 m MR Peak grad:    142.3 mmHg  Systemic Diam: 1.90 cm MR Mean grad:    81.0 mmHg MR Vmax:         596.50 cm/s MR Vmean:        412.5 cm/s MR PISA:         2.26 cm MR PISA Eff ROA: 15 mm MR PISA Radius:  0.60 cm MV E velocity: 52.21 cm/s MV A velocity: 127.00 cm/s MV E/A ratio:  0.41 Gwyndolyn Kaufman MD Electronically signed by Gwyndolyn Kaufman MD Signature Date/Time: 10/04/2020/11:15:05 AM    Final    Scheduled Meds: . aspirin  81 mg  Oral Daily  . enoxaparin (LOVENOX) injection  40 mg Subcutaneous Q24H  . hydrochlorothiazide  25 mg Oral Daily  . losartan  50 mg Oral Daily  . metoprolol tartrate  12.5 mg Oral BID  . sodium chloride flush  3 mL Intravenous Q12H   Continuous Infusions: . sodium chloride       LOS: 0 days    Time spent: 35 mins    Shreshta Medley, MD [facility]   If 7PM-7AM, please contact night-coverage

## 2020-10-05 DIAGNOSIS — I5023 Acute on chronic systolic (congestive) heart failure: Secondary | ICD-10-CM

## 2020-10-05 LAB — BASIC METABOLIC PANEL
Anion gap: 6 (ref 5–15)
BUN: 5 mg/dL — ABNORMAL LOW (ref 8–23)
CO2: 29 mmol/L (ref 22–32)
Calcium: 8.9 mg/dL (ref 8.9–10.3)
Chloride: 103 mmol/L (ref 98–111)
Creatinine, Ser: 0.69 mg/dL (ref 0.44–1.00)
GFR, Estimated: >60 mL/min (ref >60)
Glucose, Bld: 103 mg/dL — ABNORMAL HIGH (ref 70–99)
Potassium: 4.1 mmol/L (ref 3.5–5.1)
Sodium: 138 mmol/L (ref 135–145)

## 2020-10-05 LAB — CBC
HCT: 28.7 % — ABNORMAL LOW (ref 36.0–46.0)
Hemoglobin: 8.7 g/dL — ABNORMAL LOW (ref 12.0–15.0)
MCH: 28.9 pg (ref 26.0–34.0)
MCHC: 30.3 g/dL (ref 30.0–36.0)
MCV: 95.3 fL (ref 80.0–100.0)
Platelets: 200 10*3/uL (ref 150–400)
RBC: 3.01 MIL/uL — ABNORMAL LOW (ref 3.87–5.11)
RDW: 15.8 % — ABNORMAL HIGH (ref 11.5–15.5)
WBC: 5.5 10*3/uL (ref 4.0–10.5)
nRBC: 0 % (ref 0.0–0.2)

## 2020-10-05 LAB — LIPID PANEL
Cholesterol: 91 mg/dL (ref 0–200)
HDL: 33 mg/dL — ABNORMAL LOW (ref >40)
LDL Cholesterol: 50 mg/dL (ref 0–99)
Total CHOL/HDL Ratio: 2.8 RATIO
Triglycerides: 38 mg/dL (ref <150)
VLDL: 8 mg/dL (ref 0–40)

## 2020-10-05 LAB — PHOSPHORUS: Phosphorus: 4.9 mg/dL — ABNORMAL HIGH (ref 2.5–4.6)

## 2020-10-05 LAB — MAGNESIUM: Magnesium: 1.6 mg/dL — ABNORMAL LOW (ref 1.7–2.4)

## 2020-10-05 MED ORDER — FUROSEMIDE 40 MG PO TABS
40.0000 mg | ORAL_TABLET | Freq: Every day | ORAL | Status: DC
  Administered 2020-10-05 – 2020-10-07 (×3): 40 mg via ORAL
  Filled 2020-10-05 (×3): qty 1

## 2020-10-05 MED ORDER — MELATONIN 5 MG PO TABS
10.0000 mg | ORAL_TABLET | Freq: Every day | ORAL | Status: DC
  Administered 2020-10-05 – 2020-10-06 (×2): 10 mg via ORAL
  Filled 2020-10-05 (×2): qty 2

## 2020-10-05 MED ORDER — ONDANSETRON HCL 4 MG/2ML IJ SOLN
4.0000 mg | Freq: Four times a day (QID) | INTRAMUSCULAR | Status: DC | PRN
  Administered 2020-10-05 – 2020-10-06 (×2): 4 mg via INTRAVENOUS
  Filled 2020-10-05 (×2): qty 2

## 2020-10-05 NOTE — Progress Notes (Signed)
Progress Note  Patient Name: Marie Church Date of Encounter: 10/05/2020  [facility] Cardiologist: New, Dr Croitoru  Subjective   SOB improving.   Inpatient Medications    Scheduled Meds: . aspirin  81 mg Oral Daily  . carvedilol  6.25 mg Oral BID WC  . enoxaparin (LOVENOX) injection  40 mg Subcutaneous Q24H  . sacubitril-valsartan  1 tablet Oral BID  . sodium chloride flush  3 mL Intravenous Q12H   Continuous Infusions: . sodium chloride     PRN Meds: sodium chloride, acetaminophen, labetalol, sodium chloride flush   Vital Signs    Vitals:   10/04/20 2332 10/05/20 0428 10/05/20 0642 10/05/20 0758  BP: 115/68 116/68  116/81  Pulse: 90 88  81  Resp: (!) 22 17  (!) 23  Temp: 98.8 F (37.1 C) 98.5 F (36.9 C)  98.5 F (36.9 C)  TempSrc: Oral Oral  Oral  SpO2: 100% 100%  99%  Weight:   87.5 kg   Height:        Intake/Output Summary (Last 24 hours) at 10/05/2020 1038 Last data filed at 10/05/2020 0900 Gross per 24 hour  Intake 613 ml  Output 750 ml  Net -137 ml   Last 3 Weights 10/05/2020 10/04/2020  Weight (lbs) 192 lb 14.4 oz 198 lb 3.1 oz  Weight (kg) 87.5 kg 89.9 kg      Telemetry    SR- Personally Reviewed  ECG    n/a- Personally Reviewed  Physical Exam   GEN: No acute distress.   Neck: No JVD Cardiac: RRR, no murmurs, rubs, or gallops.  Respiratory: Clear to auscultation bilaterally. GI: Soft, nontender, non-distended  MS: No edema; No deformity. Neuro:  Nonfocal  Psych: Normal affect   Labs    High Sensitivity Troponin:   Recent Labs  Lab 10/03/20 1353 10/03/20 1615 10/04/20 0150 10/04/20 0526 10/04/20 0744  TROPONINIHS 34* 39* 70* 69* 65*      Chemistry Recent Labs  Lab 10/03/20 1353 10/04/20 0150 10/05/20 0108  NA 142 139 138  K 3.2* 3.4* 4.1  CL 107 107 103  CO2 28 26 29   GLUCOSE 117* 124* 103*  BUN 7* 6* 5*  CREATININE 0.69 0.68 0.69  CALCIUM 8.9 8.5* 8.9  GFRNONAA >60 >60 >60  ANIONGAP 7 6 6       Hematology Recent Labs  Lab 10/03/20 1353 10/04/20 0150 10/05/20 0108  WBC 5.7 6.0 5.5  RBC 3.76* 3.31* 3.01*  HGB 10.8* 9.4* 8.7*  HCT 35.8* 31.9* 28.7*  MCV 95.2 96.4 95.3  MCH 28.7 28.4 28.9  MCHC 30.2 29.5* 30.3  RDW 15.7* 15.9* 15.8*  PLT 250 220 200    BNP Recent Labs  Lab 10/03/20 1353  BNP 97.9     DDimer  Recent Labs  Lab 10/03/20 1426  DDIMER 1.32*     Radiology    DG Chest 2 View  Result Date: 10/03/2020 CLINICAL DATA:  Shortness of breath, hypertension EXAM: CHEST - 2 VIEW COMPARISON:  09/16/2020 FINDINGS: The heart size and mediastinal contours are within normal limits. Left chest port catheter. Mild, diffuse interstitial pulmonary opacity, similar to prior examination. The visualized skeletal structures are unremarkable. IMPRESSION: Mild, diffuse interstitial pulmonary opacity, similar to prior examination and may reflect mild edema and/or chronic interstitial change. No new or focal airspace opacity. Electronically Signed   By: Eddie Candle M.D.   On: 10/03/2020 12:13   CT Angio Chest PE W and/or Wo Contrast  Result Date: 10/03/2020 CLINICAL  DATA:  Positive D-dimer, shortness of breath EXAM: CT ANGIOGRAPHY CHEST WITH CONTRAST TECHNIQUE: Multidetector CT imaging of the chest was performed using the standard protocol during bolus administration of intravenous contrast. Multiplanar CT image reconstructions and MIPs were obtained to evaluate the vascular anatomy. CONTRAST:  8mL OMNIPAQUE IOHEXOL 350 MG/ML SOLN COMPARISON:  09/17/2020 FINDINGS: Cardiovascular:  Left port catheter tubing mid SVC level. Tortuosity of the major Quenesha Douglass vessels. Negative for aneurysm or dissection. No mediastinal hemorrhage or hematoma. Pulmonary arteries appear patent. No significant filling defect or pulmonary embolus by CTA. Normal heart size.  No pericardial effusion. Mediastinum/Nodes: Stable bilateral axillary adenopathy. No significant mediastinal hilar adenopathy. Thyroid  unremarkable. Trachea central airways are patent. Esophagus nondilated. No hiatal hernia. Lungs/Pleura: Anterior right middle lobe and lingula scarring. Low lung volumes with hypoventilatory changes noted. Minor basilar atelectasis. No acute airspace process, pneumonia or consolidation. No interstitial process or edema. No pleural abnormality, effusion or pneumothorax. Upper Abdomen: No acute abnormality. Musculoskeletal: No acute osseous finding or compression fracture. Sternum intact. Review of the MIP images confirms the above findings. IMPRESSION: Negative for significant acute pulmonary embolus by CTA. Overall stable exam. Low lung volumes with areas of parenchymal scarring and atelectasis. No acute airspace process Similar bilateral axillary adenopathy. Electronically Signed   By: Jerilynn Mages.  Shick M.D.   On: 10/03/2020 16:22   ECHOCARDIOGRAM COMPLETE  Result Date: 10/04/2020    ECHOCARDIOGRAM REPORT   Patient Name:   Marie Church Date of Exam: 10/04/2020 Medical Rec #:  [id number]        Height:       67.0 in Accession #:    [id number]       Weight:       198.2 lb Date of Birth:  01/18/1956        BSA:          2.014 m Patient Age:    65 years         BP:           172/119 mmHg Patient Gender: F                HR:           104 bpm. Exam Location:  Inpatient Procedure: 2D Echo, 3D Echo, Cardiac Doppler, Color Doppler and Strain Analysis Indications:    R06.02 SOB  History:        Patient has no prior history of Echocardiogram examinations.                 Signs/Symptoms:Dizziness/Lightheadedness, Shortness of Breath                 and Dyspnea; Risk Factors:Diabetes and Hypertension. Breast                 cancer. Port. Hypoxia.  Sonographer:    Roseanna Rainbow RDCS Referring Phys: [id number] Nicolette Bang  Sonographer Comments: Patient is morbidly obese. Image acquisition challenging due to patient body habitus. Global longitudinal strain was attempted. IMPRESSIONS  1. Left ventricular ejection fraction, by  estimation, is 25 to 30%. Left ventricular ejection fraction by 3D volume is 28 %. The left ventricle has severely decreased function. The left ventricle demonstrates global hypokinesis. There is moderate concentric left ventricular hypertrophy. Indeterminate diastolic filling due to E-A fusion. The average left ventricular global longitudinal strain is -10.3 %. The global longitudinal strain is abnormal.  2. Right ventricular systolic function is mildly reduced. The right ventricular size is normal.  3. The mitral  valve is normal in structure. There is mild-to-moderate posterolaterally directed mitral regurgitation based on quantitative assessment with EROA 0.15; RVol 27. Based on visual assesment, appears more moderate. Mechanism appears functional.  4. The aortic valve is tricuspid. There is mild calcification of the aortic valve. There is mild thickening of the aortic valve. Aortic valve regurgitation is not visualized. Mild aortic valve sclerosis is present, with no evidence of aortic valve stenosis.  5. The inferior vena cava is dilated in size with <50% respiratory variability, suggesting right atrial pressure of 15 mmHg. Comparison(s): No prior Echocardiogram. FINDINGS  Left Ventricle: No LV trhomus visualized. Left ventricular ejection fraction, by estimation, is 25 to 30%. Left ventricular ejection fraction by 3D volume is 28 %. The left ventricle has severely decreased function. The left ventricle demonstrates global hypokinesis. The average left ventricular global longitudinal strain is -10.3 %. The global longitudinal strain is abnormal. The left ventricular internal cavity size was normal in size. There is moderate concentric left ventricular hypertrophy. Indeterminate diastolic filling due to E-A fusion. Right Ventricle: The right ventricular size is normal. No increase in right ventricular wall thickness. Right ventricular systolic function is mildly reduced. Left Atrium: Left atrial size was normal  in size. Right Atrium: Right atrial size was normal in size. Pericardium: There is no evidence of pericardial effusion. Mitral Valve: The mitral valve is normal in structure. There is mild thickening of the mitral valve leaflet(s). There is mild calcification of the mitral valve leaflet(s). Mild mitral annular calcification. There is mild-to-moderate mitral regurgitation based on quantitative assessment with EROA 0.15; RVol 27. Based on visual assesment, appears more moderate. mitral valve regurgitation. Tricuspid Valve: The tricuspid valve is normal in structure. Tricuspid valve regurgitation is trivial. Aortic Valve: The aortic valve is tricuspid. There is mild calcification of the aortic valve. There is mild thickening of the aortic valve. Aortic valve regurgitation is not visualized. Mild aortic valve sclerosis is present, with no evidence of aortic valve stenosis. Pulmonic Valve: The pulmonic valve was normal in structure. Pulmonic valve regurgitation is trivial. Aorta: The aortic root and ascending aorta are structurally normal, with no evidence of dilitation. Venous: The inferior vena cava is dilated in size with less than 50% respiratory variability, suggesting right atrial pressure of 15 mmHg. IAS/Shunts: No atrial level shunt detected by color flow Doppler.  LEFT VENTRICLE PLAX 2D LVIDd:         4.10 cm         Diastology LVIDs:         3.60 cm         LV e' medial:    6.85 cm/s LV PW:         1.80 cm         LV E/e' medial:  7.6 LV IVS:        1.40 cm         LV e' lateral:   6.42 cm/s LVOT diam:     1.90 cm         LV E/e' lateral: 8.1 LV SV:         49 LV SV Index:   24              2D LVOT Area:     2.84 cm        Longitudinal  Strain                                2D Strain GLS  -10.3 % LV Volumes (MOD)               Avg: LV vol d, MOD    98.7 ml A2C:                           3D Volume EF LV vol d, MOD    80.4 ml       LV 3D EF:    Left A4C:                                         ventricular LV vol s, MOD    71.3 ml                    ejection A2C:                                        fraction by LV vol s, MOD    56.6 ml                    3D volume A4C:                                        is 28 %. LV SV MOD A2C:   27.4 ml LV SV MOD A4C:   80.4 ml LV SV MOD BP:    24.2 ml       3D Volume EF:                                3D EF:        28 % RIGHT VENTRICLE            IVC RV S prime:     8.59 cm/s  IVC diam: 2.50 cm TAPSE (M-mode): 1.3 cm LEFT ATRIUM             Index       RIGHT ATRIUM          Index LA diam:        3.80 cm 1.89 cm/m  RA Area:     8.20 cm LA Vol (A2C):   44.3 ml 21.99 ml/m RA Volume:   13.40 ml 6.65 ml/m LA Vol (A4C):   38.2 ml 18.96 ml/m LA Biplane Vol: 42.2 ml 20.95 ml/m  AORTIC VALVE             PULMONIC VALVE LVOT Vmax:   113.00 cm/s PR End Diast Vel: 1.95 msec LVOT Vmean:  74.900 cm/s LVOT VTI:    0.173 m  AORTA Ao Root diam: 2.70 cm Ao Asc diam:  3.20 cm MITRAL VALVE MV Area (PHT): 6.96 cm      SHUNTS MV Decel Time: 109 msec      Systemic VTI:  0.17 m MR Peak grad:    142.3 mmHg  Systemic Diam: 1.90 cm MR Mean grad:  81.0 mmHg MR Vmax:         596.50 cm/s MR Vmean:        412.5 cm/s MR PISA:         2.26 cm MR PISA Eff ROA: 15 mm MR PISA Radius:  0.60 cm MV E velocity: 52.21 cm/s MV A velocity: 127.00 cm/s MV E/A ratio:  0.41 Gwyndolyn Kaufman MD Electronically signed by Gwyndolyn Kaufman MD Signature Date/Time: 10/04/2020/11:15:05 AM    Final     Cardiac Studies     Patient Profile     Nolie Bignell is a 65 y.o. female with a PMH of HTN, DM type 2, and metastatic breast cancer currently on neoadjuvant chemotherapy who is being seen 10/04/2020 for the evaluation of CHF at the request of Dr. Dwyane Dee. Assessment & Plan    1. Acute combined systolic/diastolic HF - 07/4194 echo LVEF 62% - 09/2020 echo LVEF 22-29%, indet diastlic fxn, mild RV dysfunction, mild to mod MR function,  - patient presented with progressive SOB and LE edema  over the past few weeks - She is currently undergoing chemotherapy for management of her metastatic breast cancer with chemotherapy regimen including herceptin. 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 She has completed her herceptin neoadjuvant therapy anyway. Herceptin should not be restarted. - plan would be medical therapy x 3 months, if ongoing dysfunction consider ischemic evaluation at that time.   Incomplete I/Os. REported weights 198-->192 lbs. Received IV lasix 40mg  x 1 yesterday. Stable renal function. Start oral lasix 40mg  daily.  - medical therapy with coreg 6.25mg  bid (started yesterday), entresto 49/51mg  bid (started this AM). Follow bp's, likely low dose aldactone tomrrow, farxiga at discharge.   Would monitor vitals and labs with recent med changes, ambulate patient today, potential discharge tomorrow.       For questions or updates, please contact [place] Please consult [email or web address] for contact info under        Signed, Carlyle Dolly, MD  10/05/2020, 10:38 AM

## 2020-10-05 NOTE — Progress Notes (Addendum)
PROGRESS NOTE    Marie Church  [id number] DOB: [birth year] DOA: 10/03/2020 PCP: System, Provider Not In   Brief Narrative:  This 65 years old female with PMH significant for HTN, anemia, breast cancer, type 2 diabetes presented to ED with 2-week history of progressive worsening shortness of breath and chest discomfort.  She also reported bilateral feet swelling for last 2 weeks.  Patient is originally from [place] and visiting [place]. EKG shows sinus tachycardia but no ischemic changes.  CT chest ruled out PE.  Troponins were slightly elevated and trending up.  She was also found to be hypoxic requiring 2 L of oxygen.  Echocardiogram completed shows EF 20 to 25%. Cardiology consulted.  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 D-dimer slightly elevated.  CTA ruled out PE. Continue diuretics Cardiology following/heart failure education ongoing Minimally elevated troponin secondary to HF exacerbation/volume/hypoxia - unlikely ACS  Hypertensive Urgency Continue metoprolol, losartan, add hydrochlorothiazide.   Normocytic Anemia, likely chronic disease  Stable near baseline  Hypokalemia, resolved  Monitor closely given ongoing diuresis as above  T2DM, non insulin dependent, controlled A1C 6.4 Continue sliding scale. Hold home medications.  DVT prophylaxis: lovenox Code Status: full Family Communication: none  Status is: inpt  Dispo: The patient is from: home              Anticipated d/c is to: same               Anticipated d/c date is: 24-48h              Patient currently not medically stable for discharge  Consultants:   cardiology  Procedures:   none  Antimicrobials:  none   Subjective: No acute issues/events overnight - denies chest pain, nausea, vomiting, headache, fevers, chills  Objective: Vitals:   10/04/20 2032 10/04/20 2332 10/05/20 0428 10/05/20 0642  BP: 104/73 115/68 116/68   Pulse: 84 90 88   Resp: (!) 22 (!) 22 17   Temp: 98.1 F (36.7 C) 98.8 F (37.1 C) 98.5 F (36.9 C)   TempSrc: Oral Oral Oral   SpO2: 100% 100% 100%   Weight:    87.5 kg  Height:        Intake/Output Summary (Last 24 hours) at 10/05/2020 0744 Last data filed at 10/05/2020 0643 Gross per 24 hour  Intake 490 ml  Output 750 ml  Net -260 ml   Filed Weights   10/04/20 0027 10/05/20 0642  Weight: 89.9 kg 87.5 kg    Examination:  General exam: Appears calm and comfortable  Respiratory system: Clear to auscultation. Respiratory effort normal. Cardiovascular system: S1 & S2 heard, RRR. No JVD, murmurs, rubs, gallops or clicks. No pedal edema. Gastrointestinal system: Abdomen is nondistended, soft and nontender. No organomegaly or masses felt. Normal bowel sounds heard. Central nervous system: Alert and oriented. No focal neurological deficits. Extremities: Symmetric 5 x 5 power; 2+ pitting edema BLE to mid calf Skin: No rashes, lesions or ulcers Psychiatry: Judgement and insight appear normal. Mood & affect appropriate.  Data Reviewed: I have personally reviewed following labs and imaging studies  CBC: Recent Labs  Lab 10/03/20 1353 10/04/20 0150 10/05/20 0108  WBC 5.7 6.0 5.5  NEUTROABS 2.7  --   --   HGB 10.8* 9.4* 8.7*  HCT 35.8* 31.9* 28.7*  MCV 95.2 96.4 95.3  PLT 250 220 973   Basic Metabolic Panel: Recent Labs  Lab 10/03/20 1353 10/04/20 0150 10/05/20 0108  NA 142 139 138  K 3.2* 3.4* 4.1  CL 107 107 103  CO2 28 26 29   GLUCOSE 117* 124* 103*  BUN 7*  6* 5*  CREATININE 0.69 0.68 0.69  CALCIUM 8.9 8.5* 8.9  MG  --   --  1.6*  PHOS  --   --  4.9*   GFR: Estimated Creatinine Clearance: 81.8 mL/min (by C-G formula based on SCr of 0.69 mg/dL). Liver Function Tests: No results for input(s): AST, ALT, ALKPHOS, BILITOT, PROT, ALBUMIN in the last 168 hours. No results for input(s): LIPASE, AMYLASE in the last 168 hours. No results for input(s): AMMONIA in the last 168 hours. Coagulation Profile: No results for input(s): INR, PROTIME in the last 168 hours. Cardiac Enzymes: No results for input(s): CKTOTAL, CKMB, CKMBINDEX, TROPONINI in the last 168 hours. BNP (last 3 results) No results for input(s): PROBNP in the last 8760 hours. HbA1C: Recent Labs    10/04/20 0150  HGBA1C 6.4*   CBG: Recent Labs  Lab 10/03/20 1242 10/04/20 0617  GLUCAP 153* 98   Lipid Profile: Recent Labs    10/05/20 0108  CHOL 91  HDL 33*  LDLCALC 50  TRIG 38  CHOLHDL 2.8   Thyroid Function Tests: Recent Labs    10/04/20 0150  TSH 2.281   Anemia Panel: No results for input(s): VITAMINB12, FOLATE, FERRITIN, TIBC, IRON, RETICCTPCT in the last 72 hours. Sepsis Labs: No results for input(s): PROCALCITON, LATICACIDVEN in the last 168 hours.  Recent Results (from the past 240 hour(s))  Resp Panel by RT-PCR (Flu A&B, Covid) Nasopharyngeal Swab     Status: None   Collection Time: 10/03/20  6:28 PM   Specimen: Nasopharyngeal Swab; Nasopharyngeal(NP) swabs in vial transport medium  Result Value Ref Range Status   SARS Coronavirus 2 by RT PCR NEGATIVE NEGATIVE Final    Comment: (NOTE) SARS-CoV-2 target nucleic acids are NOT DETECTED.  The SARS-CoV-2 RNA is generally detectable in upper respiratory specimens during the acute phase of infection. The lowest concentration of SARS-CoV-2 viral copies this assay can detect is 138 copies/mL. A negative result does not preclude SARS-Cov-2 infection and should not be used as the sole basis for treatment  or other patient management decisions. A negative result may occur with  improper specimen collection/handling, submission of specimen other than nasopharyngeal swab, presence of viral mutation(s) within the areas targeted by this assay, and inadequate number of viral copies(<138 copies/mL). A negative result must be combined with clinical observations, patient history, and epidemiological information. The expected result is Negative.  Fact Sheet for Patients:  EntrepreneurPulse.com.au  Fact Sheet for Healthcare Providers:  IncredibleEmployment.be  This test is no t yet approved or cleared by the [place] FDA and  has been authorized for detection and/or diagnosis of SARS-CoV-2 by FDA under an Emergency Use Authorization (EUA). This EUA will remain  in effect (meaning this test can be used) for the duration of the COVID-19 declaration under Section 564(b)(1) of the Act, 21 U.S.C.section 360bbb-3(b)(1), unless the authorization is terminated  or revoked sooner.  Influenza A by PCR NEGATIVE NEGATIVE Final   Influenza B by PCR NEGATIVE NEGATIVE Final    Comment: (NOTE) The Xpert Xpress SARS-CoV-2/FLU/RSV plus assay is intended as an aid in the diagnosis of influenza from Nasopharyngeal swab specimens and should not be used as a sole basis for treatment. Nasal washings and aspirates are unacceptable for Xpert Xpress SARS-CoV-2/FLU/RSV testing.  Fact Sheet for Patients: EntrepreneurPulse.com.au  Fact Sheet for Healthcare Providers: IncredibleEmployment.be  This test is not yet approved or cleared by the [place] FDA and has been authorized for detection and/or diagnosis of SARS-CoV-2 by FDA under an Emergency Use Authorization (EUA). This EUA will remain in effect (meaning this test can be used) for the duration of the COVID-19 declaration under Section 564(b)(1) of the Act, 21 U.S.C. section  360bbb-3(b)(1), unless the authorization is terminated or revoked.  Performed at [facility], [place] [address] [place], [place] [address]          Radiology Studies: DG Chest 2 View  Result Date: 10/03/2020 CLINICAL DATA:  Shortness of breath, hypertension EXAM: CHEST - 2 VIEW COMPARISON:  09/16/2020 FINDINGS: The heart size and mediastinal contours are within normal limits. Left chest port catheter. Mild, diffuse interstitial pulmonary opacity, similar to prior examination. The visualized skeletal structures are unremarkable. IMPRESSION: Mild, diffuse interstitial pulmonary opacity, similar to prior examination and may reflect mild edema and/or chronic interstitial change. No new or focal airspace opacity. Electronically Signed   By: Eddie Candle M.D.   On: 10/03/2020 12:13   CT Angio Chest PE W and/or Wo Contrast  Result Date: 10/03/2020 CLINICAL DATA:  Positive D-dimer, shortness of breath EXAM: CT ANGIOGRAPHY CHEST WITH CONTRAST TECHNIQUE: Multidetector CT imaging of the chest was performed using the standard protocol during bolus administration of intravenous contrast. Multiplanar CT image reconstructions and MIPs were obtained to evaluate the vascular anatomy. CONTRAST:  83mL OMNIPAQUE IOHEXOL 350 MG/ML SOLN COMPARISON:  09/17/2020 FINDINGS: Cardiovascular:  Left port catheter tubing mid SVC level. Tortuosity of the major branch vessels. Negative for aneurysm or dissection. No mediastinal hemorrhage or hematoma. Pulmonary arteries appear patent. No significant filling defect or pulmonary embolus by CTA. Normal heart size.  No pericardial effusion. Mediastinum/Nodes: Stable bilateral axillary adenopathy. No significant mediastinal hilar adenopathy. Thyroid unremarkable. Trachea central airways are patent. Esophagus nondilated. No hiatal hernia. Lungs/Pleura: Anterior right middle lobe and lingula scarring. Low lung volumes with hypoventilatory changes noted. Minor basilar atelectasis.  No acute airspace process, pneumonia or consolidation. No interstitial process or edema. No pleural abnormality, effusion or pneumothorax. Upper Abdomen: No acute abnormality. Musculoskeletal: No acute osseous finding or compression fracture. Sternum intact. Review of the MIP images confirms the above findings. IMPRESSION: Negative for significant acute pulmonary embolus by CTA. Overall stable exam. Low lung volumes with areas of parenchymal scarring and atelectasis. No acute airspace process Similar bilateral axillary adenopathy. Electronically Signed   By: Jerilynn Mages.  Shick M.D.   On: 10/03/2020 16:22   ECHOCARDIOGRAM COMPLETE  Result Date: 10/04/2020    ECHOCARDIOGRAM REPORT   Patient Name:   ANGELIAH TABOR Date of Exam: 10/04/2020 Medical Rec #:  [id number]        Height:       67.0 in Accession #:    [id number]       Weight:       198.2 lb Date of Birth:  02-14-1956        BSA:          2.014 m Patient Age:  65 years         BP:           172/119 mmHg Patient Gender: F                HR:           104 bpm. Exam Location:  Inpatient Procedure: 2D Echo, 3D Echo, Cardiac Doppler, Color Doppler and Strain Analysis Indications:    R06.02 SOB  History:        Patient has no prior history of Echocardiogram examinations.                 Signs/Symptoms:Dizziness/Lightheadedness, Shortness of Breath                 and Dyspnea; Risk Factors:Diabetes and Hypertension. Breast                 cancer. Port. Hypoxia.  Sonographer:    Roseanna Rainbow RDCS Referring Phys: [id number] Nicolette Bang  Sonographer Comments: Patient is morbidly obese. Image acquisition challenging due to patient body habitus. Global longitudinal strain was attempted. IMPRESSIONS  1. Left ventricular ejection fraction, by estimation, is 25 to 30%. Left ventricular ejection fraction by 3D volume is 28 %. The left ventricle has severely decreased function. The left ventricle demonstrates global hypokinesis. There is moderate concentric left ventricular  hypertrophy. Indeterminate diastolic filling due to E-A fusion. The average left ventricular global longitudinal strain is -10.3 %. The global longitudinal strain is abnormal.  2. Right ventricular systolic function is mildly reduced. The right ventricular size is normal.  3. The mitral valve is normal in structure. There is mild-to-moderate posterolaterally directed mitral regurgitation based on quantitative assessment with EROA 0.15; RVol 27. Based on visual assesment, appears more moderate. Mechanism appears functional.  4. The aortic valve is tricuspid. There is mild calcification of the aortic valve. There is mild thickening of the aortic valve. Aortic valve regurgitation is not visualized. Mild aortic valve sclerosis is present, with no evidence of aortic valve stenosis.  5. The inferior vena cava is dilated in size with <50% respiratory variability, suggesting right atrial pressure of 15 mmHg. Comparison(s): No prior Echocardiogram. FINDINGS  Left Ventricle: No LV trhomus visualized. Left ventricular ejection fraction, by estimation, is 25 to 30%. Left ventricular ejection fraction by 3D volume is 28 %. The left ventricle has severely decreased function. The left ventricle demonstrates global hypokinesis. The average left ventricular global longitudinal strain is -10.3 %. The global longitudinal strain is abnormal. The left ventricular internal cavity size was normal in size. There is moderate concentric left ventricular hypertrophy. Indeterminate diastolic filling due to E-A fusion. Right Ventricle: The right ventricular size is normal. No increase in right ventricular wall thickness. Right ventricular systolic function is mildly reduced. Left Atrium: Left atrial size was normal in size. Right Atrium: Right atrial size was normal in size. Pericardium: There is no evidence of pericardial effusion. Mitral Valve: The mitral valve is normal in structure. There is mild thickening of the mitral valve leaflet(s).  There is mild calcification of the mitral valve leaflet(s). Mild mitral annular calcification. There is mild-to-moderate mitral regurgitation based on quantitative assessment with EROA 0.15; RVol 27. Based on visual assesment, appears more moderate. mitral valve regurgitation. Tricuspid Valve: The tricuspid valve is normal in structure. Tricuspid valve regurgitation is trivial. Aortic Valve: The aortic valve is tricuspid. There is mild calcification of the aortic valve. There is mild thickening of the aortic valve. Aortic valve regurgitation is not  visualized. Mild aortic valve sclerosis is present, with no evidence of aortic valve stenosis. Pulmonic Valve: The pulmonic valve was normal in structure. Pulmonic valve regurgitation is trivial. Aorta: The aortic root and ascending aorta are structurally normal, with no evidence of dilitation. Venous: The inferior vena cava is dilated in size with less than 50% respiratory variability, suggesting right atrial pressure of 15 mmHg. IAS/Shunts: No atrial level shunt detected by color flow Doppler.  LEFT VENTRICLE PLAX 2D LVIDd:         4.10 cm         Diastology LVIDs:         3.60 cm         LV e' medial:    6.85 cm/s LV PW:         1.80 cm         LV E/e' medial:  7.6 LV IVS:        1.40 cm         LV e' lateral:   6.42 cm/s LVOT diam:     1.90 cm         LV E/e' lateral: 8.1 LV SV:         49 LV SV Index:   24              2D LVOT Area:     2.84 cm        Longitudinal                                Strain                                2D Strain GLS  -10.3 % LV Volumes (MOD)               Avg: LV vol d, MOD    98.7 ml A2C:                           3D Volume EF LV vol d, MOD    80.4 ml       LV 3D EF:    Left A4C:                                        ventricular LV vol s, MOD    71.3 ml                    ejection A2C:                                        fraction by LV vol s, MOD    56.6 ml                    3D volume A4C:                                        is  28 %. LV SV MOD A2C:   27.4 ml LV SV MOD A4C:   80.4 ml LV SV MOD BP:  24.2 ml       3D Volume EF:                                3D EF:        28 % RIGHT VENTRICLE            IVC RV S prime:     8.59 cm/s  IVC diam: 2.50 cm TAPSE (M-mode): 1.3 cm LEFT ATRIUM             Index       RIGHT ATRIUM          Index LA diam:        3.80 cm 1.89 cm/m  RA Area:     8.20 cm LA Vol (A2C):   44.3 ml 21.99 ml/m RA Volume:   13.40 ml 6.65 ml/m LA Vol (A4C):   38.2 ml 18.96 ml/m LA Biplane Vol: 42.2 ml 20.95 ml/m  AORTIC VALVE             PULMONIC VALVE LVOT Vmax:   113.00 cm/s PR End Diast Vel: 1.95 msec LVOT Vmean:  74.900 cm/s LVOT VTI:    0.173 m  AORTA Ao Root diam: 2.70 cm Ao Asc diam:  3.20 cm MITRAL VALVE MV Area (PHT): 6.96 cm      SHUNTS MV Decel Time: 109 msec      Systemic VTI:  0.17 m MR Peak grad:    142.3 mmHg  Systemic Diam: 1.90 cm MR Mean grad:    81.0 mmHg MR Vmax:         596.50 cm/s MR Vmean:        412.5 cm/s MR PISA:         2.26 cm MR PISA Eff ROA: 15 mm MR PISA Radius:  0.60 cm MV E velocity: 52.21 cm/s MV A velocity: 127.00 cm/s MV E/A ratio:  0.41 Gwyndolyn Kaufman MD Electronically signed by Gwyndolyn Kaufman MD Signature Date/Time: 10/04/2020/11:15:05 AM    Final     Scheduled Meds: . aspirin  81 mg Oral Daily  . carvedilol  6.25 mg Oral BID WC  . enoxaparin (LOVENOX) injection  40 mg Subcutaneous Q24H  . sacubitril-valsartan  1 tablet Oral BID  . sodium chloride flush  3 mL Intravenous Q12H   Continuous Infusions: . sodium chloride       LOS: 1 day   Time spent: 63min  Swayze Pries C Gannon Heinzman, DO [facility]  If 7PM-7AM, please contact night-coverage [email or web address]  10/05/2020, 7:44 AM

## 2020-10-05 NOTE — Progress Notes (Signed)
Offered to ambulate with pt in hallway a couple times today. Pt denied for nausea and diarrhea. Pt received PRN zofran earlier today, which provided relief to pt. Pt up to chair for dinner at this time.

## 2020-10-06 LAB — BASIC METABOLIC PANEL
Anion gap: 9 (ref 5–15)
BUN: 9 mg/dL (ref 8–23)
CO2: 30 mmol/L (ref 22–32)
Calcium: 8.8 mg/dL — ABNORMAL LOW (ref 8.9–10.3)
Chloride: 98 mmol/L (ref 98–111)
Creatinine, Ser: 0.75 mg/dL (ref 0.44–1.00)
GFR, Estimated: >60 mL/min (ref >60)
Glucose, Bld: 113 mg/dL — ABNORMAL HIGH (ref 70–99)
Potassium: 4.3 mmol/L (ref 3.5–5.1)
Sodium: 137 mmol/L (ref 135–145)

## 2020-10-06 LAB — CBC
HCT: 32.5 % — ABNORMAL LOW (ref 36.0–46.0)
Hemoglobin: 9.8 g/dL — ABNORMAL LOW (ref 12.0–15.0)
MCH: 28.7 pg (ref 26.0–34.0)
MCHC: 30.2 g/dL (ref 30.0–36.0)
MCV: 95 fL (ref 80.0–100.0)
Platelets: 222 10*3/uL (ref 150–400)
RBC: 3.42 MIL/uL — ABNORMAL LOW (ref 3.87–5.11)
RDW: 15.7 % — ABNORMAL HIGH (ref 11.5–15.5)
WBC: 5.3 10*3/uL (ref 4.0–10.5)
nRBC: 0 % (ref 0.0–0.2)

## 2020-10-06 MED ORDER — SPIRONOLACTONE 12.5 MG HALF TABLET
12.5000 mg | ORAL_TABLET | Freq: Every day | ORAL | Status: DC
  Filled 2020-10-06: qty 1

## 2020-10-06 MED ORDER — MAGNESIUM SULFATE 2 GM/50ML IV SOLN
2.0000 g | Freq: Once | INTRAVENOUS | Status: AC
  Administered 2020-10-06: 2 g via INTRAVENOUS
  Filled 2020-10-06: qty 50

## 2020-10-06 MED ORDER — SACUBITRIL-VALSARTAN 24-26 MG PO TABS
1.0000 | ORAL_TABLET | Freq: Two times a day (BID) | ORAL | Status: DC
  Administered 2020-10-06 – 2020-10-07 (×2): 1 via ORAL
  Filled 2020-10-06 (×2): qty 1

## 2020-10-06 NOTE — Care Management (Addendum)
1103 10-06-20 Case Manager spoke with patient regarding medication assistance. Patient states she is without insurance, she lives in [place], and utilizes the [facility]. The clinic usually assists the patient with medications. Patient states while in [place] she is staying with son and daughter. Patient uses the local [facility] off [address].for any new medications. Patient states she has an appointment in [facility] 10-10-20. Case Manager provided the patient with an Entresto 30 day free card. No further needs at this time. Bethena Roys, RN. BSN Case Manager     [phone] 10-06-20 If patient does not transition home today, she will benefit from medications being sent to [place]. Patient may need assistance with MATCH. Bethena Roys, RN,BSN Case Manger

## 2020-10-06 NOTE — Progress Notes (Signed)
Mobility Specialist: Progress Note   10/06/20 1750  Mobility  Activity Ambulated in hall  Level of Assistance Standby assist, set-up cues, supervision of patient - no hands on  Assistive Device Front wheel walker  Distance Ambulated (ft) 370 ft  Mobility Ambulated with assistance in hallway  Mobility Response Tolerated well  Mobility performed by Mobility specialist  Bed Position Chair  $Mobility charge 1 Mobility   Pre-Mobility: 97 HR During Mobility on 2L: 125 HR, 100% SpO2 Post-Mobility on RA: 103 HR, 96% SpO2  Pt began ambulation on 2 L/min [place] and was weaned down to RA with sats maintaining 96% after walk. Pt to recliner after walk with call bell and phone by her side. Pt c/o R knee pain during ambulation but was otherwise asx.   [facility] [place] Mobility Specialist Mobility Specialist Phone: [phone]

## 2020-10-06 NOTE — Progress Notes (Signed)
PROGRESS NOTE    Marie Church  [id number] DOB: 17-Feb-1956 DOA: 10/03/2020 PCP: System, Provider Not In   Brief Narrative:  This 65 years old female with PMH significant for HTN, anemia, breast cancer, type 2 diabetes presented to ED with 2-week history of progressive worsening shortness of breath and chest discomfort.  She also reported bilateral feet swelling for last 2 weeks.  Patient is originally from [place] and visiting [place]. EKG shows sinus tachycardia but no ischemic changes.  CT chest ruled out PE.  Troponins were slightly elevated and trending up.  She was also found to be hypoxic requiring 2 L of oxygen.  Echocardiogram completed shows EF 20 to 25%. Cardiology consulted.  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 D-dimer slightly elevated. CTA ruled out PE. Continue diuretics Cardiology following/heart failure education ongoing Minimally elevated troponin secondary to HF exacerbation/volume/hypoxia - unlikely ACS  Hypertensive Urgency Continue metoprolol, losartan, add hydrochlorothiazide.   Normocytic Anemia, likely chronic disease  Stable near baseline  Hypokalemia, resolved  Monitor closely given ongoing diuresis as above  T2DM, non insulin dependent, controlled A1C 6.4 Continue sliding scale. Hold home medications.  DVT prophylaxis: lovenox Code Status: full Family Communication: none  Status is: inpt  Dispo: The patient is from: home              Anticipated  d/c is to: same              Anticipated d/c date is: 24-48h              Patient currently not medically stable for discharge  Consultants:   cardiology  Procedures:   none  Antimicrobials:  none   Subjective: No acute issues/events overnight - denies chest pain, nausea, vomiting, headache, fevers, chills  Objective: Vitals:   10/05/20 1725 10/05/20 2016 10/06/20 0005 10/06/20 0412  BP: 128/78 110/76 116/71 115/65  Pulse: 99 92 94 (!) 107  Resp: 16 18 18 20   Temp: 98.2 F (36.8 C) 98.8 F (37.1 C) 99.3 F (37.4 C) 98.8 F (37.1 C)  TempSrc: Oral Oral Oral Oral  SpO2: 100% 100% 100% 97%  Weight:      Height:        Intake/Output Summary (Last 24 hours) at 10/06/2020 0730 Last data filed at 10/06/2020 0200 Gross per 24 hour  Intake 1203 ml  Output 600 ml  Net 603 ml   Filed Weights   10/04/20 0027 10/05/20 0642  Weight: 89.9 kg 87.5 kg    Examination:  General exam: Appears calm and comfortable  Respiratory system: Clear to auscultation. Respiratory effort normal. Cardiovascular system: S1 & S2 heard, RRR. No JVD, murmurs, rubs, gallops or clicks. No pedal edema. Gastrointestinal system: Abdomen is nondistended, soft and nontender. No organomegaly or masses felt. Normal bowel sounds heard. Central nervous system: Alert and oriented. No focal neurological deficits. Extremities: Symmetric 5 x 5 power; 2+ pitting edema BLE to mid calf Skin: No rashes, lesions or ulcers Psychiatry: Judgement and insight appear normal. Mood &  affect appropriate.   Data Reviewed: I have personally reviewed following labs and imaging studies  CBC: Recent Labs  Lab 10/03/20 1353 10/04/20 0150 10/05/20 0108 10/06/20 0142  WBC 5.7 6.0 5.5 5.3  NEUTROABS 2.7  --   --   --   HGB 10.8* 9.4* 8.7* 9.8*  HCT 35.8* 31.9* 28.7* 32.5*  MCV 95.2 96.4 95.3 95.0  PLT 250 220 200 631   Basic Metabolic Panel: Recent Labs  Lab 10/03/20 1353 10/04/20 0150 10/05/20 0108 10/06/20 0142   NA 142 139 138 137  K 3.2* 3.4* 4.1 4.3  CL 107 107 103 98  CO2 28 26 29 30   GLUCOSE 117* 124* 103* 113*  BUN 7* 6* 5* 9  CREATININE 0.69 0.68 0.69 0.75  CALCIUM 8.9 8.5* 8.9 8.8*  MG  --   --  1.6*  --   PHOS  --   --  4.9*  --    GFR: Estimated Creatinine Clearance: 81.8 mL/min (by C-G formula based on SCr of 0.75 mg/dL). Liver Function Tests: No results for input(s): AST, ALT, ALKPHOS, BILITOT, PROT, ALBUMIN in the last 168 hours. No results for input(s): LIPASE, AMYLASE in the last 168 hours. No results for input(s): AMMONIA in the last 168 hours. Coagulation Profile: No results for input(s): INR, PROTIME in the last 168 hours. Cardiac Enzymes: No results for input(s): CKTOTAL, CKMB, CKMBINDEX, TROPONINI in the last 168 hours. BNP (last 3 results) No results for input(s): PROBNP in the last 8760 hours. HbA1C: Recent Labs    10/04/20 0150  HGBA1C 6.4*   CBG: Recent Labs  Lab 10/03/20 1242 10/04/20 0617  GLUCAP 153* 98   Lipid Profile: Recent Labs    10/05/20 0108  CHOL 91  HDL 33*  LDLCALC 50  TRIG 38  CHOLHDL 2.8   Thyroid Function Tests: Recent Labs    10/04/20 0150  TSH 2.281   Anemia Panel: No results for input(s): VITAMINB12, FOLATE, FERRITIN, TIBC, IRON, RETICCTPCT in the last 72 hours. Sepsis Labs: No results for input(s): PROCALCITON, LATICACIDVEN in the last 168 hours.  Recent Results (from the past 240 hour(s))  Resp Panel by RT-PCR (Flu A&B, Covid) Nasopharyngeal Swab     Status: None   Collection Time: 10/03/20  6:28 PM   Specimen: Nasopharyngeal Swab; Nasopharyngeal(NP) swabs in vial transport medium  Result Value Ref Range Status   SARS Coronavirus 2 by RT PCR NEGATIVE NEGATIVE Final    Comment: (NOTE) SARS-CoV-2 target nucleic acids are NOT DETECTED.  The SARS-CoV-2 RNA is generally detectable in upper respiratory specimens during the acute phase of infection. The lowest concentration of SARS-CoV-2 viral copies this assay can  detect is 138 copies/mL. A negative result does not preclude SARS-Cov-2 infection and should not be used as the sole basis for treatment or other patient management decisions. A negative result may occur with  improper specimen collection/handling, submission of specimen other than nasopharyngeal swab, presence of viral mutation(s) within the areas targeted by this assay, and inadequate number of viral copies(<138 copies/mL). A negative result must be combined with clinical observations, patient history, and epidemiological information. The expected result is Negative.  Fact Sheet for Patients:  EntrepreneurPulse.com.au  Fact Sheet for Healthcare Providers:  IncredibleEmployment.be  This test is no t yet approved or cleared by the [place] FDA and  has been authorized for detection and/or diagnosis of SARS-CoV-2 by FDA under an Emergency Use Authorization (EUA). This EUA will remain  in effect (meaning this test can  be used) for the duration of the COVID-19 declaration under Section 564(b)(1) of the Act, 21 U.S.C.section 360bbb-3(b)(1), unless the authorization is terminated  or revoked sooner.       Influenza A by PCR NEGATIVE NEGATIVE Final   Influenza B by PCR NEGATIVE NEGATIVE Final    Comment: (NOTE) The Xpert Xpress SARS-CoV-2/FLU/RSV plus assay is intended as an aid in the diagnosis of influenza from Nasopharyngeal swab specimens and should not be used as a sole basis for treatment. Nasal washings and aspirates are unacceptable for Xpert Xpress SARS-CoV-2/FLU/RSV testing.  Fact Sheet for Patients: EntrepreneurPulse.com.au  Fact Sheet for Healthcare Providers: IncredibleEmployment.be  This test is not yet approved or cleared by the [place] FDA and has been authorized for detection and/or diagnosis of SARS-CoV-2 by FDA under an Emergency Use Authorization (EUA). This EUA will remain in  effect (meaning this test can be used) for the duration of the COVID-19 declaration under Section 564(b)(1) of the Act, 21 U.S.C. section 360bbb-3(b)(1), unless the authorization is terminated or revoked.  Performed at [facility], [place] [address] [place], [place] [address]          Radiology Studies: ECHOCARDIOGRAM COMPLETE  Result Date: 10/04/2020    ECHOCARDIOGRAM REPORT   Patient Name:   Marie Church Date of Exam: 10/04/2020 Medical Rec #:  [id number]        Height:       67.0 in Accession #:    [id number]       Weight:       198.2 lb Date of Birth:  1956/01/30        BSA:          2.014 m Patient Age:    12 years         BP:           172/119 mmHg Patient Gender: F                HR:           104 bpm. Exam Location:  Inpatient Procedure: 2D Echo, 3D Echo, Cardiac Doppler, Color Doppler and Strain Analysis Indications:    R06.02 SOB  History:        Patient has no prior history of Echocardiogram examinations.                 Signs/Symptoms:Dizziness/Lightheadedness, Shortness of Breath                 and Dyspnea; Risk Factors:Diabetes and Hypertension. Breast                 cancer. Port. Hypoxia.  Sonographer:    Roseanna Rainbow RDCS Referring Phys: [id number] Nicolette Bang  Sonographer Comments: Patient is morbidly obese. Image acquisition challenging due to patient body habitus. Global longitudinal strain was attempted. IMPRESSIONS  1. Left ventricular ejection fraction, by estimation, is 25 to 30%. Left ventricular ejection fraction by 3D volume is 28 %. The left ventricle has severely decreased function. The left ventricle demonstrates global hypokinesis. There is moderate concentric left ventricular hypertrophy. Indeterminate diastolic filling due to E-A fusion. The average left ventricular global longitudinal strain is -10.3 %. The global longitudinal strain is abnormal.  2. Right ventricular systolic function is mildly reduced. The right ventricular size is normal.  3. The  mitral valve is normal in structure. There is mild-to-moderate posterolaterally directed mitral regurgitation based on quantitative assessment with EROA 0.15; RVol 27. Based on visual assesment, appears more moderate.  Mechanism appears functional.  4. The aortic valve is tricuspid. There is mild calcification of the aortic valve. There is mild thickening of the aortic valve. Aortic valve regurgitation is not visualized. Mild aortic valve sclerosis is present, with no evidence of aortic valve stenosis.  5. The inferior vena cava is dilated in size with <50% respiratory variability, suggesting right atrial pressure of 15 mmHg. Comparison(s): No prior Echocardiogram. FINDINGS  Left Ventricle: No LV trhomus visualized. Left ventricular ejection fraction, by estimation, is 25 to 30%. Left ventricular ejection fraction by 3D volume is 28 %. The left ventricle has severely decreased function. The left ventricle demonstrates global hypokinesis. The average left ventricular global longitudinal strain is -10.3 %. The global longitudinal strain is abnormal. The left ventricular internal cavity size was normal in size. There is moderate concentric left ventricular hypertrophy. Indeterminate diastolic filling due to E-A fusion. Right Ventricle: The right ventricular size is normal. No increase in right ventricular wall thickness. Right ventricular systolic function is mildly reduced. Left Atrium: Left atrial size was normal in size. Right Atrium: Right atrial size was normal in size. Pericardium: There is no evidence of pericardial effusion. Mitral Valve: The mitral valve is normal in structure. There is mild thickening of the mitral valve leaflet(s). There is mild calcification of the mitral valve leaflet(s). Mild mitral annular calcification. There is mild-to-moderate mitral regurgitation based on quantitative assessment with EROA 0.15; RVol 27. Based on visual assesment, appears more moderate. mitral valve regurgitation.  Tricuspid Valve: The tricuspid valve is normal in structure. Tricuspid valve regurgitation is trivial. Aortic Valve: The aortic valve is tricuspid. There is mild calcification of the aortic valve. There is mild thickening of the aortic valve. Aortic valve regurgitation is not visualized. Mild aortic valve sclerosis is present, with no evidence of aortic valve stenosis. Pulmonic Valve: The pulmonic valve was normal in structure. Pulmonic valve regurgitation is trivial. Aorta: The aortic root and ascending aorta are structurally normal, with no evidence of dilitation. Venous: The inferior vena cava is dilated in size with less than 50% respiratory variability, suggesting right atrial pressure of 15 mmHg. IAS/Shunts: No atrial level shunt detected by color flow Doppler.  LEFT VENTRICLE PLAX 2D LVIDd:         4.10 cm         Diastology LVIDs:         3.60 cm         LV e' medial:    6.85 cm/s LV PW:         1.80 cm         LV E/e' medial:  7.6 LV IVS:        1.40 cm         LV e' lateral:   6.42 cm/s LVOT diam:     1.90 cm         LV E/e' lateral: 8.1 LV SV:         49 LV SV Index:   24              2D LVOT Area:     2.84 cm        Longitudinal                                Strain  2D Strain GLS  -10.3 % LV Volumes (MOD)               Avg: LV vol d, MOD    98.7 ml A2C:                           3D Volume EF LV vol d, MOD    80.4 ml       LV 3D EF:    Left A4C:                                        ventricular LV vol s, MOD    71.3 ml                    ejection A2C:                                        fraction by LV vol s, MOD    56.6 ml                    3D volume A4C:                                        is 28 %. LV SV MOD A2C:   27.4 ml LV SV MOD A4C:   80.4 ml LV SV MOD BP:    24.2 ml       3D Volume EF:                                3D EF:        28 % RIGHT VENTRICLE            IVC RV S prime:     8.59 cm/s  IVC diam: 2.50 cm TAPSE (M-mode): 1.3 cm LEFT ATRIUM              Index       RIGHT ATRIUM          Index LA diam:        3.80 cm 1.89 cm/m  RA Area:     8.20 cm LA Vol (A2C):   44.3 ml 21.99 ml/m RA Volume:   13.40 ml 6.65 ml/m LA Vol (A4C):   38.2 ml 18.96 ml/m LA Biplane Vol: 42.2 ml 20.95 ml/m  AORTIC VALVE             PULMONIC VALVE LVOT Vmax:   113.00 cm/s PR End Diast Vel: 1.95 msec LVOT Vmean:  74.900 cm/s LVOT VTI:    0.173 m  AORTA Ao Root diam: 2.70 cm Ao Asc diam:  3.20 cm MITRAL VALVE MV Area (PHT): 6.96 cm      SHUNTS MV Decel Time: 109 msec      Systemic VTI:  0.17 m MR Peak grad:    142.3 mmHg  Systemic Diam: 1.90 cm MR Mean grad:    81.0 mmHg MR Vmax:         596.50 cm/s MR Vmean:        412.5 cm/s MR PISA:  2.26 cm MR PISA Eff ROA: 15 mm MR PISA Radius:  0.60 cm MV E velocity: 52.21 cm/s MV A velocity: 127.00 cm/s MV E/A ratio:  0.41 Gwyndolyn Kaufman MD Electronically signed by Gwyndolyn Kaufman MD Signature Date/Time: 10/04/2020/11:15:05 AM    Final     Scheduled Meds: . aspirin  81 mg Oral Daily  . carvedilol  6.25 mg Oral BID WC  . enoxaparin (LOVENOX) injection  40 mg Subcutaneous Q24H  . furosemide  40 mg Oral Daily  . melatonin  10 mg Oral QHS  . sacubitril-valsartan  1 tablet Oral BID  . sodium chloride flush  3 mL Intravenous Q12H   Continuous Infusions: . sodium chloride       LOS: 2 days   Time spent: 10min  Arsal Tappan C Simon Llamas, DO [facility]  If 7PM-7AM, please contact night-coverage [email or web address]  10/06/2020, 7:30 AM

## 2020-10-06 NOTE — Progress Notes (Addendum)
Progress Note  Patient Name: Marie Church Date of Encounter: 10/06/2020  [facility] Cardiologist:New Dr Sallyanne Kuster  Subjective   Some nausea yesterday that has resolved, no SOB  Inpatient Medications    Scheduled Meds: . aspirin  81 mg Oral Daily  . carvedilol  6.25 mg Oral BID WC  . enoxaparin (LOVENOX) injection  40 mg Subcutaneous Q24H  . furosemide  40 mg Oral Daily  . melatonin  10 mg Oral QHS  . sacubitril-valsartan  1 tablet Oral BID  . sodium chloride flush  3 mL Intravenous Q12H   Continuous Infusions: . sodium chloride     PRN Meds: sodium chloride, acetaminophen, labetalol, ondansetron (ZOFRAN) IV, sodium chloride flush   Vital Signs    Vitals:   10/06/20 0005 10/06/20 0412 10/06/20 0808 10/06/20 0811  BP: 116/71 115/65 106/74   Pulse: 94 (!) 107 (!) 103 100  Resp: 18 20 12    Temp: 99.3 F (37.4 C) 98.8 F (37.1 C) 99.5 F (37.5 C)   TempSrc: Oral Oral Oral   SpO2: 100% 97% 99%   Weight:      Height:        Intake/Output Summary (Last 24 hours) at 10/06/2020 0954 Last data filed at 10/06/2020 2536 Gross per 24 hour  Intake 840 ml  Output 1600 ml  Net -760 ml   Last 3 Weights 10/05/2020 10/04/2020  Weight (lbs) 192 lb 14.4 oz 198 lb 3.1 oz  Weight (kg) 87.5 kg 89.9 kg      Telemetry    SR - Personally Reviewed  ECG    n/a- Personally Reviewed  Physical Exam   GEN: No acute distress.   Neck: No JVD Cardiac: RRR, no murmurs, rubs, or gallops.  Respiratory: Clear to auscultation bilaterally. GI: Soft, nontender, non-distended  MS: No edema; No deformity. Neuro:  Nonfocal  Psych: Normal affect   Labs    High Sensitivity Troponin:   Recent Labs  Lab 10/03/20 1353 10/03/20 1615 10/04/20 0150 10/04/20 0526 10/04/20 0744  TROPONINIHS 34* 39* 70* 69* 65*      Chemistry Recent Labs  Lab 10/04/20 0150 10/05/20 0108 10/06/20 0142  NA 139 138 137  K 3.4* 4.1 4.3  CL 107 103 98  CO2 26 29 30   GLUCOSE 124* 103* 113*   BUN 6* 5* 9  CREATININE 0.68 0.69 0.75  CALCIUM 8.5* 8.9 8.8*  GFRNONAA >60 >60 >60  ANIONGAP 6 6 9      Hematology Recent Labs  Lab 10/04/20 0150 10/05/20 0108 10/06/20 0142  WBC 6.0 5.5 5.3  RBC 3.31* 3.01* 3.42*  HGB 9.4* 8.7* 9.8*  HCT 31.9* 28.7* 32.5*  MCV 96.4 95.3 95.0  MCH 28.4 28.9 28.7  MCHC 29.5* 30.3 30.2  RDW 15.9* 15.8* 15.7*  PLT 220 200 222    BNP Recent Labs  Lab 10/03/20 1353  BNP 97.9     DDimer  Recent Labs  Lab 10/03/20 1426  DDIMER 1.32*     Radiology    No results found.  Cardiac Studies    Patient Profile     Marie Church a 65 y.o.femalewith a PMH of HTN, DM type 2, andmetastatic breast cancer currently onneoadjuvantchemotherapywho is being seen 5/20/2022for the evaluation of CHFat the request of Dr. Dwyane Dee.  Assessment & Plan    1. Acute combined systolic/diastolic HF - 10/4401 echo LVEF 62% - 09/2020 echo LVEF 47-42%, indet diastlic fxn, mild RV dysfunction, mild to mod MR function,  - patient presented with progressive  SOB and LE edema over the past few weeks - She is currently undergoing chemotherapy for management of her metastatic breast cancer with chemotherapy regimen including herceptin. 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 She has completed her herceptinneoadjuvant therapyanyway. Herceptin should not be restarted. - plan would be medical therapy x 3 months, if ongoing dysfunction consider ischemic evaluation at that time.   Incomplete I/Os. REported weights 198-->192 lbs. She is on oral lasix 40mg  daily. Renal function is stable.   - medical therapy with coreg 6.25mg  bid, entresto 49/51mg  bid. Soft bp's at times, will try low dose aldactone 12.5mg  daily.  - could start [place] at outpatient f/u.    2. Nausea/diarrhea - per primary team  Will have patient ambulate with nursing staff, if does ok  would be ok for discharge today. We will arrange f/u.    Addendum: low bp's this afternoon, will d/c aldactone and lower entresto to 24/26mg  bid.  For questions or updates, please contact [place] Please consult [email or web address] for contact info under        Signed, Carlyle Dolly, MD  10/06/2020, 9:54 AM

## 2020-10-07 ENCOUNTER — Other Ambulatory Visit (HOSPITAL_COMMUNITY): Payer: Self-pay

## 2020-10-07 LAB — BASIC METABOLIC PANEL
Anion gap: 5 (ref 5–15)
BUN: 8 mg/dL (ref 8–23)
CO2: 33 mmol/L — ABNORMAL HIGH (ref 22–32)
Calcium: 8.7 mg/dL — ABNORMAL LOW (ref 8.9–10.3)
Chloride: 98 mmol/L (ref 98–111)
Creatinine, Ser: 0.83 mg/dL (ref 0.44–1.00)
GFR, Estimated: >60 mL/min (ref >60)
Glucose, Bld: 163 mg/dL — ABNORMAL HIGH (ref 70–99)
Potassium: 3.8 mmol/L (ref 3.5–5.1)
Sodium: 136 mmol/L (ref 135–145)

## 2020-10-07 LAB — CBC
HCT: 30.7 % — ABNORMAL LOW (ref 36.0–46.0)
Hemoglobin: 9.2 g/dL — ABNORMAL LOW (ref 12.0–15.0)
MCH: 29 pg (ref 26.0–34.0)
MCHC: 30 g/dL (ref 30.0–36.0)
MCV: 96.8 fL (ref 80.0–100.0)
Platelets: 229 10*3/uL (ref 150–400)
RBC: 3.17 MIL/uL — ABNORMAL LOW (ref 3.87–5.11)
RDW: 15.7 % — ABNORMAL HIGH (ref 11.5–15.5)
WBC: 5.2 10*3/uL (ref 4.0–10.5)
nRBC: 0 % (ref 0.0–0.2)

## 2020-10-07 MED ORDER — ASPIRIN 81 MG PO CHEW
81.0000 mg | CHEWABLE_TABLET | Freq: Every day | ORAL | 3 refills | Status: DC
  Filled 2020-10-07: qty 30, 30d supply, fill #0

## 2020-10-07 MED ORDER — SACUBITRIL-VALSARTAN 24-26 MG PO TABS
1.0000 | ORAL_TABLET | Freq: Two times a day (BID) | ORAL | 2 refills | Status: DC
  Filled 2020-10-07: qty 60, 30d supply, fill #0

## 2020-10-07 MED ORDER — FUROSEMIDE 40 MG PO TABS
40.0000 mg | ORAL_TABLET | Freq: Every day | ORAL | 2 refills | Status: DC
  Filled 2020-10-07: qty 30, 30d supply, fill #0

## 2020-10-07 MED ORDER — CARVEDILOL 3.125 MG PO TABS
3.1250 mg | ORAL_TABLET | Freq: Two times a day (BID) | ORAL | 1 refills | Status: DC
  Filled 2020-10-07: qty 60, 30d supply, fill #0

## 2020-10-07 NOTE — Progress Notes (Signed)
Progress Note  Patient Name: Marie Church Date of Encounter: 10/07/2020  [facility] Cardiologist:New Dr Sallyanne Kuster  Subjective   BP low yesterday - aldactone stopped and the Entresto dose was lowered to 24/26 mg BID. BP now improved. Net negative 620 cc - on oral lasix  Inpatient Medications    Scheduled Meds: . aspirin  81 mg Oral Daily  . carvedilol  6.25 mg Oral BID WC  . enoxaparin (LOVENOX) injection  40 mg Subcutaneous Q24H  . furosemide  40 mg Oral Daily  . melatonin  10 mg Oral QHS  . sacubitril-valsartan  1 tablet Oral BID  . sodium chloride flush  3 mL Intravenous Q12H   Continuous Infusions: . sodium chloride     PRN Meds: sodium chloride, acetaminophen, labetalol, ondansetron (ZOFRAN) IV, sodium chloride flush   Vital Signs    Vitals:   10/06/20 2041 10/06/20 2341 10/07/20 0345 10/07/20 0815  BP: 101/60 (!) 85/56 (!) 126/91 113/62  Pulse: 83 78 96 100  Resp: 20 17 20 18   Temp: 98.5 F (36.9 C) 98.5 F (36.9 C) 98.5 F (36.9 C) 98 F (36.7 C)  TempSrc: Oral Oral Oral Oral  SpO2: 98% 91% 100% 96%  Weight:   91.7 kg   Height:        Intake/Output Summary (Last 24 hours) at 10/07/2020 1020 Last data filed at 10/06/2020 1711 Gross per 24 hour  Intake 300 ml  Output 400 ml  Net -100 ml   Last 3 Weights 10/07/2020 10/05/2020 10/04/2020  Weight (lbs) 202 lb 2.6 oz 192 lb 14.4 oz 198 lb 3.1 oz  Weight (kg) 91.7 kg 87.5 kg 89.9 kg      Telemetry    SR - Personally Reviewed  ECG    N/A  Physical Exam   GEN: No acute distress.   Neck: No JVD Cardiac: RRR, no murmurs, rubs, or gallops.  Respiratory: Clear to auscultation bilaterally. GI: Soft, nontender, non-distended  MS: No edema; No deformity. Neuro:  Nonfocal  Psych: Normal affect   Labs    High Sensitivity Troponin:   Recent Labs  Lab 10/03/20 1353 10/03/20 1615 10/04/20 0150 10/04/20 0526 10/04/20 0744  TROPONINIHS 34* 39* 70* 69* 65*      Chemistry Recent Labs  Lab  10/05/20 0108 10/06/20 0142 10/07/20 0233  NA 138 137 136  K 4.1 4.3 3.8  CL 103 98 98  CO2 29 30 33*  GLUCOSE 103* 113* 163*  BUN 5* 9 8  CREATININE 0.69 0.75 0.83  CALCIUM 8.9 8.8* 8.7*  GFRNONAA >60 >60 >60  ANIONGAP 6 9 5      Hematology Recent Labs  Lab 10/05/20 0108 10/06/20 0142 10/07/20 0233  WBC 5.5 5.3 5.2  RBC 3.01* 3.42* 3.17*  HGB 8.7* 9.8* 9.2*  HCT 28.7* 32.5* 30.7*  MCV 95.3 95.0 96.8  MCH 28.9 28.7 29.0  MCHC 30.3 30.2 30.0  RDW 15.8* 15.7* 15.7*  PLT 200 222 229    BNP Recent Labs  Lab 10/03/20 1353  BNP 97.9     DDimer  Recent Labs  Lab 10/03/20 1426  DDIMER 1.32*     Radiology    No results found.  Cardiac Studies   N/A  Patient Profile     Marie Church a 65 y.o.femalewith a PMH of HTN, DM type 2, andmetastatic breast cancer currently onneoadjuvantchemotherapywho is being seen 5/20/2022for the evaluation of CHFat the request of Dr. Dwyane Dee.  Assessment & Plan    1. Acute combined  systolic/diastolic HF - 10/7891 echo LVEF 62% - 09/2020 echo LVEF 81-01%, indet diastlic fxn, mild RV dysfunction, mild to mod MR function,  - patient presented with progressive SOB and LE edema over the past few weeks - She is currently undergoing chemotherapy for management of her metastatic breast cancer with chemotherapy regimen including herceptin. 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 She has completed her herceptinneoadjuvant therapyanyway. Herceptin should not be restarted. - plan would be medical therapy x 3 months, if ongoing dysfunction consider ischemic evaluation at that time.   Incomplete I/Os. REported weights 198-->192 lbs. She is on oral lasix 40mg  daily. Renal function is stable.   - medical therapy with coreg 6.25mg  bid, entresto 49/51mg  bid. Soft bp's at times, will try low dose aldactone 12.5mg  daily.  - could  start farxiga at outpatient f/u.    2. Nausea/diarrhea - per primary team  [facility] will sign off.   Medication Recommendations:  As above, continue current meds Other recommendations (labs, testing, etc):  Needs HF follow-up Follow up as an outpatient:  Dr. Sallyanne Kuster or APP  For questions or updates, please contact [facility] Please consult [email or web address] for contact info under   Pixie Casino, MD, FACC, [place] Director of the [facility] Diplomate of the American Board of Clinical Lipidology Attending Cardiologist  Direct Dial: [phone]  Fax: [phone]  Website:  [email or web address]  Pixie Casino, MD  10/07/2020, 10:20 AM

## 2020-10-07 NOTE — TOC Transition Note (Signed)
Transition of Care (TOC) - CM/SW Discharge Note [name] RN, BSN Transitions of Care Unit 4E- RN Case Manager See Treatment Team for direct phone #    Patient Details  Name: Marie Church MRN: [id number] Date of Birth: Nov 28, 1955  Transition of Care [facility]) CM/SW Contact:  Dawayne Patricia, RN Phone Number: 10/07/2020, 2:04 PM   Clinical Narrative:    Pt stable for transition home today, pt will need home 02 arranged. Pt is un-insured and will need charity assistance with [employer] for home 02 needs. Per previous CM note pt has PCP in [place].   Per bedside RN pt declining RW need at this time.   TOC pharmacy to fill medications for discharge including 30 day free Entrestro. Pt has been given pt application for [facility] pt assistance. Total cost for TOC meds $9- per pt son to pay for meds- no need for [facility] assistance.   Call made to [facility] with [employer] for home 02 needs- [employer] will process home 02 referral- and deliver portable tank to room for transport home.   Pt to stay local with son in [place] for now.    Final next level of care: Home/Self Care Barriers to Discharge: No Barriers Identified   Patient Goals and CMS Choice Patient states their goals for this hospitalization and ongoing recovery are:: return home   Choice offered to / list presented to : NA  Discharge Placement                 Home      Discharge Plan and Services   Discharge Planning Services: CM Consult,Medication Assistance Post Acute Care Choice: Durable Medical Equipment          DME Arranged: [place] Rile rolling,Oxygen DME Agency: [employer] Date DME Agency Contacted: 10/07/20 Time DME Agency Contacted: 1400 Representative spoke with at DME Agency: [place]: NA [place] Agency: NA        Social Determinants of Health ([place]) Interventions     Readmission Risk Interventions Readmission Risk Prevention Plan 10/07/2020  Transportation Screening Complete  PCP or Specialist  Appt within 5-7 Days Complete  Home Care Screening Complete  Medication Review (RN CM) Complete

## 2020-10-07 NOTE — Progress Notes (Signed)
SATURATION QUALIFICATIONS: (This note is used to comply with regulatory documentation for home oxygen)  Patient Saturations on Room Air at Rest = 93%  Patient Saturations on Room Air while Ambulating = 88%  Patient Saturations on 2 Liters of oxygen while Ambulating = 91%  Please briefly explain why patient needs home oxygen: 

## 2020-10-07 NOTE — Discharge Summary (Signed)
Physician Discharge Summary  Marie Church [id number] DOB: [birth year] DOA: 10/03/2020  PCP: System, Provider Not In  Admit date: 10/03/2020 Discharge date: 10/07/2020  Admitted From: Home Disposition: Home  Recommendations for Outpatient Follow-up:  1. Follow up with PCP in 1-2 weeks 2. Please obtain BMP/CBC in one week 3. Please follow up with oncology and cardiology as scheduled  Home Health: None Equipment/Devices: None  Discharge Condition: Stable CODE STATUS: Full  Diet recommendation: Low-salt low-fat diet  Brief/Interim Summary: This 65 years old female with PMH significant for HTN, anemia, breast cancer, type 2 diabetes presented to ED with 2-week history of progressive worsening shortness of breath and chest discomfort. She also reported bilateral feet swelling for last 2 weeks. Patient is originally from [place] and visiting [place]. EKG shows sinus tachycardia but no ischemic changes. CT chest ruled out PE. Troponins were slightly elevated and trending up. She was also found to be hypoxic requiring 2 L of oxygen. Echocardiogram completed shows EF 20 to 25%. Cardiology consulted.  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  Patient was initiated on core measures including Entresto, carvedilol and furosemide with moderate improvement in symptoms, her carvedilol was decreased prior to discharge due to some mild hypotension.  Interestingly patient remained hypoxic while sleeping despite diuresis.  Unclear if she has sleep apnea but has not yet been diagnosed.  As such patient will be discharged with nocturnal oxygen given our findings here in the hospital and will need follow-up with pulmonology in  the outpatient setting for definitive sleep schedule.  Patient otherwise remains without hypoxia or symptoms today with ambulation, has been able to ambulate now without hypoxia for 24 hours, symptoms are resolving the patient appears to be back to baseline.  Lengthy discussion daily with patient about need for medication and lifestyle compliance, multiple rounds of education and teach back method were attempted with the patient was very poor progression of information.  As such we will request that heart failure team follow patient in the outpatient setting to ensure that she has a safe transition home understands the reasoning for her medications and dietary changes as well as to remain compliant with the use as she is extremely high risk for rapid deterioration if she becomes noncompliant with medication and lifestyle given her EF of 20%.  Patient otherwise stable and agreeable for discharge home, home prescriptions were sent to in-house transitions of care pharmacy patient was given an Entresto coupon to attempt to offset any costs.  Patient will need evaluation with cardiology in 3 months time to reevaluate EF, at which time she may be able to come off of these medications.  Discharge Diagnoses:  Active Problems:   Anemia of chronic disease   Macrocytic anemia   Breast cancer (HCC)   Type 2 diabetes mellitus without complication, without long-term current use of insulin (HCC)   Hypoxia   Hypertensive urgency   Shortness of breath   Elevated troponin    Discharge Instructions  Discharge Instructions    (HEART FAILURE PATIENTS) Call MD:  Anytime you have any of the following symptoms: 1) 3 pound weight gain in 24 hours or 5 pounds in 1 week 2) shortness of breath, with or without a dry hacking cough 3) swelling in the hands, feet or stomach 4)  if you have to sleep on extra pillows at night in order to breathe.   Complete by: As directed    AMB referral to CHF clinic   Complete by: As  directed    Limited understanding of current condition, diet/lifestyle requirements, daily monitoring(weights) - extremely high risk for recurrent HF exacerbations   Call MD for:  difficulty breathing, headache or visual disturbances   Complete by: As directed    Call MD for:  difficulty breathing, headache or visual disturbances   Complete by: As directed    Call MD for:  extreme fatigue   Complete by: As directed    Call MD for:  persistant dizziness or light-headedness   Complete by: As directed    Diet - low sodium heart healthy   Complete by: As directed    Increase activity slowly   Complete by: As directed    Increase activity slowly   Complete by: As directed      Allergies as of 10/07/2020      Reactions   Doxycycline Rash   Amoxicillin-pot Clavulanate Rash   Lisinopril Rash      Medication List    STOP taking these medications   losartan 50 MG tablet Commonly known as: Cozaar     TAKE these medications   Aspirin Low Dose 81 MG chewable tablet Generic drug: aspirin Chew 1 tablet (81 mg total) by mouth daily.   carvedilol 3.125 MG tablet Commonly known as: COREG Take 1 tablet (3.125 mg total) by mouth 2 (two) times daily with a meal.   Entresto 24-26 MG Generic drug: sacubitril-valsartan Take 1 tablet by mouth 2 (two) times daily.   furosemide 40 MG tablet Commonly known as: LASIX Take 1 tablet (40 mg total) by mouth daily.            Durable Medical Equipment  (From admission, onward)         Start     Ordered   10/07/20 1235  DME Oxygen  Once       Question Answer Comment  Length of Need 6 Months   Mode or (Route) Nasal cannula   Liters per Minute 2   Frequency Only at night (stationary unit needed)   Oxygen delivery system Gas      10/07/20 1238   10/07/20 1234  DME Walker  Once       Question Answer Comment  Walker: With 5 Inch Wheels   Patient needs a walker to treat with the following condition Ambulatory dysfunction       10/07/20 1238          Follow-up Information    [facility] up.   Specialty: Cardiology Why: the office should call you Monday to arrange follow up if you have not heard by Tuesday please call the office.    Contact information: [address] [id number] [place]       [facility], [place] Follow up.   Specialties: Cardiology, Radiology Why: Hospital follow-up with Cardiology scheduled for 10/28/2020 at 2:15pm with [facility]my[facility] Deforest, one of Dr. Victorino December PA. Please arrive 15 minutes early for check-in. If this date/time does not work for you, please call our office to reschedule. Contact information: [address] [place] [address] [phone]        [facility] Follow up.   Why: home 02 arranged- they will deliver portable tank to room prior to discharge  Contact information: [address] [place] [address] [phone]              Allergies  Allergen Reactions  . Doxycycline Rash  . Amoxicillin-Pot Clavulanate Rash  . Lisinopril Rash    Consultations:  Cardiology   Procedures/Studies: DG Chest 2 View  Result Date: 10/03/2020 CLINICAL DATA:  Shortness of breath, hypertension EXAM: CHEST - 2 VIEW COMPARISON:  09/16/2020 FINDINGS: The heart size and mediastinal contours are within normal limits. Left chest port catheter. Mild, diffuse interstitial pulmonary opacity, similar to prior examination. The visualized skeletal structures are unremarkable. IMPRESSION: Mild, diffuse interstitial pulmonary opacity, similar to prior examination and may reflect mild edema and/or chronic interstitial change. No new or focal airspace opacity. Electronically Signed   By: Eddie Candle M.D.   On: 10/03/2020 12:13   DG Chest 2 View  Result Date: 09/16/2020 CLINICAL DATA:  Dyspnea, lightheaded, hypertension EXAM: CHEST - 2 VIEW COMPARISON:  None. FINDINGS: Left  internal jugular Port-A-Cath terminates in the lower third of the SVC. Normal heart size. Normal mediastinal contour. No pneumothorax. No pleural effusion. Platelike mild scarring versus atelectasis at the anterior lung bases on the lateral view. No acute consolidative airspace disease. No pulmonary edema. IMPRESSION: Platelike mild scarring versus atelectasis at the anterior lung bases on the lateral view. Otherwise no active cardiopulmonary disease. Electronically Signed   By: Ilona Sorrel M.D.   On: 09/16/2020 21:48   CT Angio Chest PE W and/or Wo Contrast  Result Date: 10/03/2020 CLINICAL DATA:  Positive D-dimer, shortness of breath EXAM: CT ANGIOGRAPHY CHEST WITH CONTRAST TECHNIQUE: Multidetector CT imaging of the chest was performed using the standard protocol during bolus administration of intravenous contrast. Multiplanar CT image reconstructions and MIPs were obtained to evaluate the vascular anatomy. CONTRAST:  49mL OMNIPAQUE IOHEXOL 350 MG/ML SOLN COMPARISON:  09/17/2020 FINDINGS: Cardiovascular:  Left port catheter tubing mid SVC level. Tortuosity of the major branch vessels. Negative for aneurysm or dissection. No mediastinal hemorrhage or hematoma. Pulmonary arteries appear patent. No significant filling defect or pulmonary embolus by CTA. Normal heart size.  No pericardial effusion. Mediastinum/Nodes: Stable bilateral axillary adenopathy. No significant mediastinal hilar adenopathy. Thyroid unremarkable. Trachea central airways are patent. Esophagus nondilated. No hiatal hernia. Lungs/Pleura: Anterior right middle lobe and lingula scarring. Low lung volumes with hypoventilatory changes noted. Minor basilar atelectasis. No acute airspace process, pneumonia or consolidation. No interstitial process or edema. No pleural abnormality, effusion or pneumothorax. Upper Abdomen: No acute abnormality. Musculoskeletal: No acute osseous finding or compression fracture. Sternum intact. Review of the MIP images  confirms the above findings. IMPRESSION: Negative for significant acute pulmonary embolus by CTA. Overall stable exam. Low lung volumes with areas of parenchymal scarring and atelectasis. No acute airspace process Similar bilateral axillary adenopathy. Electronically Signed   By: Jerilynn Mages.  Shick M.D.   On: 10/03/2020 16:22   CT Angio Chest PE W and/or Wo Contrast  Result Date: 09/17/2020 CLINICAL DATA:  Hypertension and shortness of breath. EXAM: CT ANGIOGRAPHY CHEST WITH CONTRAST TECHNIQUE: Multidetector CT imaging of the chest was performed using the standard protocol during bolus administration of intravenous contrast. Multiplanar CT image reconstructions and MIPs were obtained to evaluate the vascular anatomy. CONTRAST:  193mL OMNIPAQUE IOHEXOL 350 MG/ML SOLN COMPARISON:  None. FINDINGS: Cardiovascular: A left-sided venous Port-A-Cath is seen. The thoracic aorta is normal in caliber without evidence of aneurysmal dilatation. Satisfactory opacification of the pulmonary arteries to the segmental level. No evidence of pulmonary embolism. The left pulmonary artery  is prominent and measures 2.6 cm in diameter. Normal heart size. No pericardial effusion. Mediastinum/Nodes: No enlarged mediastinal, hilar, or axillary lymph nodes. Thyroid gland, trachea, and esophagus demonstrate no significant findings. Lungs/Pleura: Mild areas of atelectasis are seen within the right upper lobe and left lung base. There is no evidence of a pleural effusion or pneumothorax. Upper Abdomen: No acute abnormality. Musculoskeletal: No chest wall abnormality. No acute or significant osseous findings. Review of the MIP images confirms the above findings. IMPRESSION: 1. No evidence of pulmonary embolism. 2. Mild right upper lobe and left basilar atelectasis. Electronically Signed   By: Virgina Norfolk M.D.   On: 09/17/2020 00:58   ECHOCARDIOGRAM COMPLETE  Result Date: 10/04/2020    ECHOCARDIOGRAM REPORT   Patient Name:   Marie Church  Date of Exam: 10/04/2020 Medical Rec #:  [id number]        Height:       67.0 in Accession #:    [id number]       Weight:       198.2 lb Date of Birth:  [birth year]        BSA:          2.014 m Patient Age:    16 years         BP:           172/119 mmHg Patient Gender: F                HR:           104 bpm. Exam Location:  Inpatient Procedure: 2D Echo, 3D Echo, Cardiac Doppler, Color Doppler and Strain Analysis Indications:    R06.02 SOB  History:        Patient has no prior history of Echocardiogram examinations.                 Signs/Symptoms:Dizziness/Lightheadedness, Shortness of Breath                 and Dyspnea; Risk Factors:Diabetes and Hypertension. Breast                 cancer. Port. Hypoxia.  Sonographer:    Roseanna Rainbow RDCS Referring Phys: [id number] Nicolette Bang  Sonographer Comments: Patient is morbidly obese. Image acquisition challenging due to patient body habitus. Global longitudinal strain was attempted. IMPRESSIONS  1. Left ventricular ejection fraction, by estimation, is 25 to 30%. Left ventricular ejection fraction by 3D volume is 28 %. The left ventricle has severely decreased function. The left ventricle demonstrates global hypokinesis. There is moderate concentric left ventricular hypertrophy. Indeterminate diastolic filling due to E-A fusion. The average left ventricular global longitudinal strain is -10.3 %. The global longitudinal strain is abnormal.  2. Right ventricular systolic function is mildly reduced. The right ventricular size is normal.  3. The mitral valve is normal in structure. There is mild-to-moderate posterolaterally directed mitral regurgitation based on quantitative assessment with EROA 0.15; RVol 27. Based on visual assesment, appears more moderate. Mechanism appears functional.  4. The aortic valve is tricuspid. There is mild calcification of the aortic valve. There is mild thickening of the aortic valve. Aortic valve regurgitation is not visualized. Mild aortic  valve sclerosis is present, with no evidence of aortic valve stenosis.  5. The inferior vena cava is dilated in size with <50% respiratory variability, suggesting right atrial pressure of 15 mmHg. Comparison(s): No prior Echocardiogram. FINDINGS  Left Ventricle: No LV trhomus visualized. Left ventricular ejection fraction, by estimation, is 25 to  30%. Left ventricular ejection fraction by 3D volume is 28 %. The left ventricle has severely decreased function. The left ventricle demonstrates global hypokinesis. The average left ventricular global longitudinal strain is -10.3 %. The global longitudinal strain is abnormal. The left ventricular internal cavity size was normal in size. There is moderate concentric left ventricular hypertrophy. Indeterminate diastolic filling due to E-A fusion. Right Ventricle: The right ventricular size is normal. No increase in right ventricular wall thickness. Right ventricular systolic function is mildly reduced. Left Atrium: Left atrial size was normal in size. Right Atrium: Right atrial size was normal in size. Pericardium: There is no evidence of pericardial effusion. Mitral Valve: The mitral valve is normal in structure. There is mild thickening of the mitral valve leaflet(s). There is mild calcification of the mitral valve leaflet(s). Mild mitral annular calcification. There is mild-to-moderate mitral regurgitation based on quantitative assessment with EROA 0.15; RVol 27. Based on visual assesment, appears more moderate. mitral valve regurgitation. Tricuspid Valve: The tricuspid valve is normal in structure. Tricuspid valve regurgitation is trivial. Aortic Valve: The aortic valve is tricuspid. There is mild calcification of the aortic valve. There is mild thickening of the aortic valve. Aortic valve regurgitation is not visualized. Mild aortic valve sclerosis is present, with no evidence of aortic valve stenosis. Pulmonic Valve: The pulmonic valve was normal in structure. Pulmonic  valve regurgitation is trivial. Aorta: The aortic root and ascending aorta are structurally normal, with no evidence of dilitation. Venous: The inferior vena cava is dilated in size with less than 50% respiratory variability, suggesting right atrial pressure of 15 mmHg. IAS/Shunts: No atrial level shunt detected by color flow Doppler.  LEFT VENTRICLE PLAX 2D LVIDd:         4.10 cm         Diastology LVIDs:         3.60 cm         LV e' medial:    6.85 cm/s LV PW:         1.80 cm         LV E/e' medial:  7.6 LV IVS:        1.40 cm         LV e' lateral:   6.42 cm/s LVOT diam:     1.90 cm         LV E/e' lateral: 8.1 LV SV:         49 LV SV Index:   24              2D LVOT Area:     2.84 cm        Longitudinal                                Strain                                2D Strain GLS  -10.3 % LV Volumes (MOD)               Avg: LV vol d, MOD    98.7 ml A2C:                           3D Volume EF LV vol d, MOD    80.4 ml       LV 3D EF:  Left A4C:                                        ventricular LV vol s, MOD    71.3 ml                    ejection A2C:                                        fraction by LV vol s, MOD    56.6 ml                    3D volume A4C:                                        is 28 %. LV SV MOD A2C:   27.4 ml LV SV MOD A4C:   80.4 ml LV SV MOD BP:    24.2 ml       3D Volume EF:                                3D EF:        28 % RIGHT VENTRICLE            IVC RV S prime:     8.59 cm/s  IVC diam: 2.50 cm TAPSE (M-mode): 1.3 cm LEFT ATRIUM             Index       RIGHT ATRIUM          Index LA diam:        3.80 cm 1.89 cm/m  RA Area:     8.20 cm LA Vol (A2C):   44.3 ml 21.99 ml/m RA Volume:   13.40 ml 6.65 ml/m LA Vol (A4C):   38.2 ml 18.96 ml/m LA Biplane Vol: 42.2 ml 20.95 ml/m  AORTIC VALVE             PULMONIC VALVE LVOT Vmax:   113.00 cm/s PR End Diast Vel: 1.95 msec LVOT Vmean:  74.900 cm/s LVOT VTI:    0.173 m  AORTA Ao Root diam: 2.70 cm Ao Asc diam:  3.20 cm MITRAL VALVE  MV Area (PHT): 6.96 cm      SHUNTS MV Decel Time: 109 msec      Systemic VTI:  0.17 m MR Peak grad:    142.3 mmHg  Systemic Diam: 1.90 cm MR Mean grad:    81.0 mmHg MR Vmax:         596.50 cm/s MR Vmean:        412.5 cm/s MR PISA:         2.26 cm MR PISA Eff ROA: 15 mm MR PISA Radius:  0.60 cm MV E velocity: 52.21 cm/s MV A velocity: 127.00 cm/s MV E/A ratio:  0.41 Gwyndolyn Kaufman MD Electronically signed by Gwyndolyn Kaufman MD Signature Date/Time: 10/04/2020/11:15:05 AM    Final      Subjective: No acute issues or events overnight   Discharge Exam: Vitals:   10/07/20 1144 10/07/20 1319  BP: (!) 88/61 109/71  Pulse: 88   Resp: 20  16  Temp: 98.3 F (36.8 C)   SpO2: 93% 100%   Vitals:   10/07/20 0345 10/07/20 0815 10/07/20 1144 10/07/20 1319  BP: (!) 126/91 113/62 (!) 88/61 109/71  Pulse: 96 100 88   Resp: 20 18 20 16   Temp: 98.5 F (36.9 C) 98 F (36.7 C) 98.3 F (36.8 C)   TempSrc: Oral Oral Oral   SpO2: 100% 96% 93% 100%  Weight: 91.7 kg     Height:        General:  Pleasantly resting in bed, No acute distress. HEENT:  Normocephalic atraumatic.  Sclerae nonicteric, noninjected.  Extraocular movements intact bilaterally. Neck:  Without mass or deformity.  Trachea is midline. Lungs:  Clear to auscultate bilaterally without rhonchi, wheeze, or rales. Heart:  Regular rate and rhythm.  Without murmurs, rubs, or gallops. Abdomen:  Soft, nontender, nondistended.  Without guarding or rebound. Extremities: Without cyanosis, clubbing, scant bipedal edema. Vascular:  Dorsalis pedis and posterior tibial pulses palpable bilaterally. Skin:  Warm and dry, no erythema, no ulcerations.   The results of significant diagnostics from this hospitalization (including imaging, microbiology, ancillary and laboratory) are listed below for reference.     Microbiology: Recent Results (from the past 240 hour(s))  Resp Panel by RT-PCR (Flu A&B, Covid) Nasopharyngeal Swab     Status: None    Collection Time: 10/03/20  6:28 PM   Specimen: Nasopharyngeal Swab; Nasopharyngeal(NP) swabs in vial transport medium  Result Value Ref Range Status   SARS Coronavirus 2 by RT PCR NEGATIVE NEGATIVE Final    Comment: (NOTE) SARS-CoV-2 target nucleic acids are NOT DETECTED.  The SARS-CoV-2 RNA is generally detectable in upper respiratory specimens during the acute phase of infection. The lowest concentration of SARS-CoV-2 viral copies this assay can detect is 138 copies/mL. A negative result does not preclude SARS-Cov-2 infection and should not be used as the sole basis for treatment or other patient management decisions. A negative result may occur with  improper specimen collection/handling, submission of specimen other than nasopharyngeal swab, presence of viral mutation(s) within the areas targeted by this assay, and inadequate number of viral copies(<138 copies/mL). A negative result must be combined with clinical observations, patient history, and epidemiological information. The expected result is Negative.  Fact Sheet for Patients:  EntrepreneurPulse.com.au  Fact Sheet for Healthcare Providers:  IncredibleEmployment.be  This test is no t yet approved or cleared by the [place] FDA and  has been authorized for detection and/or diagnosis of SARS-CoV-2 by FDA under an Emergency Use Authorization (EUA). This EUA will remain  in effect (meaning this test can be used) for the duration of the COVID-19 declaration under Section 564(b)(1) of the Act, 21 U.S.C.section 360bbb-3(b)(1), unless the authorization is terminated  or revoked sooner.       Influenza A by PCR NEGATIVE NEGATIVE Final   Influenza B by PCR NEGATIVE NEGATIVE Final    Comment: (NOTE) The Xpert Xpress SARS-CoV-2/FLU/RSV plus assay is intended as an aid in the diagnosis of influenza from Nasopharyngeal swab specimens and should not be used as a sole basis for treatment.  Nasal washings and aspirates are unacceptable for Xpert Xpress SARS-CoV-2/FLU/RSV testing.  Fact Sheet for Patients: EntrepreneurPulse.com.au  Fact Sheet for Healthcare Providers: IncredibleEmployment.be  This test is not yet approved or cleared by the [place] FDA and has been authorized for detection and/or diagnosis of SARS-CoV-2 by FDA under an Emergency Use Authorization (EUA). This EUA will remain in effect (meaning this test can be used)  for the duration of the COVID-19 declaration under Section 564(b)(1) of the Act, 21 U.S.C. section 360bbb-3(b)(1), unless the authorization is terminated or revoked.  Performed at [facility], [address], [place], [place] [address]      Labs: BNP (last 3 results) Recent Labs    09/16/20 2120 10/03/20 1353  BNP 11.4 [id number]   Basic Metabolic Panel: Recent Labs  Lab 10/03/20 1353 10/04/20 0150 10/05/20 0108 10/06/20 0142 10/07/20 0233  NA 142 139 138 137 136  K 3.2* 3.4* 4.1 4.3 3.8  CL 107 107 103 98 98  CO2 28 26 29 30  33*  GLUCOSE 117* 124* 103* 113* 163*  BUN 7* 6* 5* 9 8  CREATININE 0.69 0.68 0.69 0.75 0.83  CALCIUM 8.9 8.5* 8.9 8.8* 8.7*  MG  --   --  1.6*  --   --   PHOS  --   --  4.9*  --   --    Liver Function Tests: No results for input(s): AST, ALT, ALKPHOS, BILITOT, PROT, ALBUMIN in the last 168 hours. No results for input(s): LIPASE, AMYLASE in the last 168 hours. No results for input(s): AMMONIA in the last 168 hours. CBC: Recent Labs  Lab 10/03/20 1353 10/04/20 0150 10/05/20 0108 10/06/20 0142 10/07/20 0233  WBC 5.7 6.0 5.5 5.3 5.2  NEUTROABS 2.7  --   --   --   --   HGB 10.8* 9.4* 8.7* 9.8* 9.2*  HCT 35.8* 31.9* 28.7* 32.5* 30.7*  MCV 95.2 96.4 95.3 95.0 96.8  PLT 250 220 200 222 229   Cardiac Enzymes: No results for input(s): CKTOTAL, CKMB, CKMBINDEX, TROPONINI in the last 168 hours. BNP: Invalid input(s): POCBNP CBG: Recent Labs  Lab  10/03/20 1242 10/04/20 0617  GLUCAP 153* 98   D-Dimer No results for input(s): DDIMER in the last 72 hours. Hgb A1c No results for input(s): HGBA1C in the last 72 hours. Lipid Profile Recent Labs    10/05/20 0108  CHOL 91  HDL 33*  LDLCALC 50  TRIG 38  CHOLHDL 2.8   Thyroid function studies No results for input(s): TSH, T4TOTAL, T3FREE, THYROIDAB in the last 72 hours.  Invalid input(s): FREET3 Anemia work up No results for input(s): VITAMINB12, FOLATE, FERRITIN, TIBC, IRON, RETICCTPCT in the last 72 hours. Urinalysis No results found for: COLORURINE, APPEARANCEUR, [place], [place], GLUCOSEU, [place], [place], [place], PROTEINUR, UROBILINOGEN, NITRITE, LEUKOCYTESUR Sepsis Labs Invalid input(s): PROCALCITONIN,  WBC,  LACTICIDVEN Microbiology Recent Results (from the past 240 hour(s))  Resp Panel by RT-PCR (Flu A&B, Covid) Nasopharyngeal Swab     Status: None   Collection Time: 10/03/20  6:28 PM   Specimen: Nasopharyngeal Swab; Nasopharyngeal(NP) swabs in vial transport medium  Result Value Ref Range Status   SARS Coronavirus 2 by RT PCR NEGATIVE NEGATIVE Final    Comment: (NOTE) SARS-CoV-2 target nucleic acids are NOT DETECTED.  The SARS-CoV-2 RNA is generally detectable in upper respiratory specimens during the acute phase of infection. The lowest concentration of SARS-CoV-2 viral copies this assay can detect is 138 copies/mL. A negative result does not preclude SARS-Cov-2 infection and should not be used as the sole basis for treatment or other patient management decisions. A negative result may occur with  improper specimen collection/handling, submission of specimen other than nasopharyngeal swab, presence of viral mutation(s) within the areas targeted by this assay, and inadequate number of viral copies(<138 copies/mL). A negative result must be combined with clinical observations, patient history, and epidemiological information. The expected  result is  Negative.  Fact Sheet for Patients:  EntrepreneurPulse.com.au  Fact Sheet for Healthcare Providers:  IncredibleEmployment.be  This test is no t yet approved or cleared by the [place] FDA and  has been authorized for detection and/or diagnosis of SARS-CoV-2 by FDA under an Emergency Use Authorization (EUA). This EUA will remain  in effect (meaning this test can be used) for the duration of the COVID-19 declaration under Section 564(b)(1) of the Act, 21 U.S.C.section 360bbb-3(b)(1), unless the authorization is terminated  or revoked sooner.       Influenza A by PCR NEGATIVE NEGATIVE Final   Influenza B by PCR NEGATIVE NEGATIVE Final    Comment: (NOTE) The Xpert Xpress SARS-CoV-2/FLU/RSV plus assay is intended as an aid in the diagnosis of influenza from Nasopharyngeal swab specimens and should not be used as a sole basis for treatment. Nasal washings and aspirates are unacceptable for Xpert Xpress SARS-CoV-2/FLU/RSV testing.  Fact Sheet for Patients: EntrepreneurPulse.com.au  Fact Sheet for Healthcare Providers: IncredibleEmployment.be  This test is not yet approved or cleared by the [place] FDA and has been authorized for detection and/or diagnosis of SARS-CoV-2 by FDA under an Emergency Use Authorization (EUA). This EUA will remain in effect (meaning this test can be used) for the duration of the COVID-19 declaration under Section 564(b)(1) of the Act, 21 U.S.C. section 360bbb-3(b)(1), unless the authorization is terminated or revoked.  Performed at [facility], [place] [address] [place], [place] [address]      Time coordinating discharge: Over 30 minutes  SIGNED:   Little Ishikawa, DO [facility] 10/07/2020, 5:44 PM Pager   If 7PM-7AM, please contact night-coverage [email or web address]

## 2020-10-08 ENCOUNTER — Emergency Department (HOSPITAL_COMMUNITY): Payer: Self-pay

## 2020-10-08 ENCOUNTER — Encounter (HOSPITAL_COMMUNITY): Payer: Self-pay | Admitting: *Deleted

## 2020-10-08 ENCOUNTER — Inpatient Hospital Stay (HOSPITAL_COMMUNITY)
Admission: EM | Admit: 2020-10-08 | Discharge: 2020-10-10 | DRG: 286 | Disposition: A | Discharge status: Home Health | Payer: Self-pay | Attending: Family Medicine | Admitting: Family Medicine

## 2020-10-08 ENCOUNTER — Encounter (HOSPITAL_COMMUNITY): Payer: Self-pay

## 2020-10-08 DIAGNOSIS — E119 Type 2 diabetes mellitus without complications: Secondary | ICD-10-CM

## 2020-10-08 DIAGNOSIS — D638 Anemia in other chronic diseases classified elsewhere: Secondary | ICD-10-CM | POA: Diagnosis present

## 2020-10-08 DIAGNOSIS — Z880 Allergy status to penicillin: Secondary | ICD-10-CM

## 2020-10-08 DIAGNOSIS — R778 Other specified abnormalities of plasma proteins: Secondary | ICD-10-CM

## 2020-10-08 DIAGNOSIS — J9621 Acute and chronic respiratory failure with hypoxia: Secondary | ICD-10-CM | POA: Diagnosis present

## 2020-10-08 DIAGNOSIS — C50411 Malignant neoplasm of upper-outer quadrant of right female breast: Secondary | ICD-10-CM

## 2020-10-08 DIAGNOSIS — J9811 Atelectasis: Secondary | ICD-10-CM | POA: Diagnosis present

## 2020-10-08 DIAGNOSIS — Z79899 Other long term (current) drug therapy: Secondary | ICD-10-CM

## 2020-10-08 DIAGNOSIS — Z20822 Contact with and (suspected) exposure to covid-19: Secondary | ICD-10-CM | POA: Diagnosis present

## 2020-10-08 DIAGNOSIS — Z6831 Body mass index (BMI) 31.0-31.9, adult: Secondary | ICD-10-CM

## 2020-10-08 DIAGNOSIS — C779 Secondary and unspecified malignant neoplasm of lymph node, unspecified: Secondary | ICD-10-CM | POA: Diagnosis present

## 2020-10-08 DIAGNOSIS — D649 Anemia, unspecified: Secondary | ICD-10-CM | POA: Diagnosis present

## 2020-10-08 DIAGNOSIS — I10 Essential (primary) hypertension: Secondary | ICD-10-CM | POA: Diagnosis present

## 2020-10-08 DIAGNOSIS — E669 Obesity, unspecified: Secondary | ICD-10-CM | POA: Diagnosis present

## 2020-10-08 DIAGNOSIS — C50911 Malignant neoplasm of unspecified site of right female breast: Secondary | ICD-10-CM | POA: Diagnosis present

## 2020-10-08 DIAGNOSIS — Z171 Estrogen receptor negative status [ER-]: Secondary | ICD-10-CM

## 2020-10-08 DIAGNOSIS — I5023 Acute on chronic systolic (congestive) heart failure: Secondary | ICD-10-CM | POA: Diagnosis present

## 2020-10-08 DIAGNOSIS — E876 Hypokalemia: Secondary | ICD-10-CM | POA: Diagnosis present

## 2020-10-08 DIAGNOSIS — R7989 Other specified abnormal findings of blood chemistry: Secondary | ICD-10-CM | POA: Diagnosis present

## 2020-10-08 DIAGNOSIS — I110 Hypertensive heart disease with heart failure: Principal | ICD-10-CM | POA: Diagnosis present

## 2020-10-08 DIAGNOSIS — Z888 Allergy status to other drugs, medicaments and biological substances status: Secondary | ICD-10-CM

## 2020-10-08 DIAGNOSIS — T451X5A Adverse effect of antineoplastic and immunosuppressive drugs, initial encounter: Secondary | ICD-10-CM | POA: Diagnosis present

## 2020-10-08 DIAGNOSIS — I427 Cardiomyopathy due to drug and external agent: Secondary | ICD-10-CM | POA: Diagnosis present

## 2020-10-08 DIAGNOSIS — Z87891 Personal history of nicotine dependence: Secondary | ICD-10-CM

## 2020-10-08 DIAGNOSIS — R0602 Shortness of breath: Secondary | ICD-10-CM

## 2020-10-08 DIAGNOSIS — I5021 Acute systolic (congestive) heart failure: Secondary | ICD-10-CM

## 2020-10-08 LAB — BASIC METABOLIC PANEL
Anion gap: 9 (ref 5–15)
BUN: <5 mg/dL — ABNORMAL LOW (ref 8–23)
CO2: 30 mmol/L (ref 22–32)
Calcium: 8.8 mg/dL — ABNORMAL LOW (ref 8.9–10.3)
Chloride: 98 mmol/L (ref 98–111)
Creatinine, Ser: 0.63 mg/dL (ref 0.44–1.00)
GFR, Estimated: >60 mL/min (ref >60)
Glucose, Bld: 103 mg/dL — ABNORMAL HIGH (ref 70–99)
Potassium: 3.9 mmol/L (ref 3.5–5.1)
Sodium: 137 mmol/L (ref 135–145)

## 2020-10-08 LAB — TROPONIN I (HIGH SENSITIVITY)
Troponin I (High Sensitivity): 18 ng/L — ABNORMAL HIGH (ref <18)
Troponin I (High Sensitivity): 18 ng/L — ABNORMAL HIGH (ref <18)

## 2020-10-08 LAB — CBC
HCT: 31.7 % — ABNORMAL LOW (ref 36.0–46.0)
Hemoglobin: 9.5 g/dL — ABNORMAL LOW (ref 12.0–15.0)
MCH: 28.7 pg (ref 26.0–34.0)
MCHC: 30 g/dL (ref 30.0–36.0)
MCV: 95.8 fL (ref 80.0–100.0)
Platelets: 216 10*3/uL (ref 150–400)
RBC: 3.31 MIL/uL — ABNORMAL LOW (ref 3.87–5.11)
RDW: 15.5 % (ref 11.5–15.5)
WBC: 4.1 10*3/uL (ref 4.0–10.5)
nRBC: 0 % (ref 0.0–0.2)

## 2020-10-08 LAB — RESP PANEL BY RT-PCR (FLU A&B, COVID) ARPGX2
Influenza A by PCR: NEGATIVE
Influenza B by PCR: NEGATIVE
SARS Coronavirus 2 by RT PCR: NEGATIVE

## 2020-10-08 LAB — BRAIN NATRIURETIC PEPTIDE: B Natriuretic Peptide: 42.4 pg/mL (ref 0.0–100.0)

## 2020-10-08 MED ORDER — SPIRONOLACTONE 12.5 MG HALF TABLET
12.5000 mg | ORAL_TABLET | Freq: Every day | ORAL | Status: DC
  Administered 2020-10-08 – 2020-10-10 (×3): 12.5 mg via ORAL
  Filled 2020-10-08 (×4): qty 1

## 2020-10-08 MED ORDER — ENOXAPARIN SODIUM 40 MG/0.4ML IJ SOSY
40.0000 mg | PREFILLED_SYRINGE | Freq: Every day | INTRAMUSCULAR | Status: DC
  Administered 2020-10-08 – 2020-10-09 (×2): 40 mg via SUBCUTANEOUS
  Filled 2020-10-08 (×2): qty 0.4

## 2020-10-08 MED ORDER — ACETAMINOPHEN 325 MG PO TABS
650.0000 mg | ORAL_TABLET | ORAL | Status: DC | PRN
  Administered 2020-10-09: 650 mg via ORAL

## 2020-10-08 MED ORDER — SODIUM CHLORIDE 0.9 % IV SOLN: 250.0000 mL | INTRAVENOUS | Status: DC | PRN

## 2020-10-08 MED ORDER — SODIUM CHLORIDE 0.9% FLUSH: 3.0000 mL | INTRAVENOUS | Status: DC | PRN

## 2020-10-08 MED ORDER — SODIUM CHLORIDE 0.9% FLUSH
3.0000 mL | Freq: Two times a day (BID) | INTRAVENOUS | Status: DC
  Administered 2020-10-08 – 2020-10-10 (×4): 3 mL via INTRAVENOUS

## 2020-10-08 MED ORDER — ASPIRIN 81 MG PO CHEW
81.0000 mg | CHEWABLE_TABLET | Freq: Every day | ORAL | Status: DC
  Administered 2020-10-09: 81 mg via ORAL
  Filled 2020-10-08 (×2): qty 1

## 2020-10-08 MED ORDER — ONDANSETRON HCL 4 MG/2ML IJ SOLN
4.0000 mg | Freq: Four times a day (QID) | INTRAMUSCULAR | Status: DC | PRN
Start: 2020-10-08 — End: 2020-10-10
  Administered 2020-10-09: 4 mg via INTRAVENOUS
  Filled 2020-10-08: qty 2

## 2020-10-08 MED ORDER — FUROSEMIDE 10 MG/ML IJ SOLN
60.0000 mg | Freq: Two times a day (BID) | INTRAMUSCULAR | Status: DC
  Administered 2020-10-08 – 2020-10-09 (×2): 60 mg via INTRAVENOUS
  Filled 2020-10-08 (×3): qty 6

## 2020-10-08 MED ORDER — SACUBITRIL-VALSARTAN 24-26 MG PO TABS
1.0000 | ORAL_TABLET | Freq: Two times a day (BID) | ORAL | Status: DC
  Administered 2020-10-09 – 2020-10-10 (×3): 1 via ORAL
  Filled 2020-10-08 (×4): qty 1

## 2020-10-08 NOTE — Consult Note (Addendum)
Advanced Heart Failure Team Consult Note   Primary Physician: unknown Primary Cardiologist:  Lyman Bishop  Reason for Consultation: acute systolic CHF  HPI:    Jamilex Bohnsack is seen today for evaluation of  Acute systolic CHF at the request of Dr. Dewaine Conger   65 yo female with PMH of HTN, T2DM,  metastatic breast cancer currently on neoadjuvant chemotherapy, recently diagnosed combined systolic  CHF  Indeterminate Intraductal papilloma diagnosed in 2019.  Pathology neg for malignancy in 2020.  First was diagnosed with breast cancer 02/2020. She was found to have metastatic disease to lymph nodes. A multimodal treatment approach was recommended including chemotherapy +/- radiation and likely surgery once tumor size improves. She was started on taxotere, carboplatin, herceptin, and perjeta q3 weeks x6 cycles. She had an echocardiogram 07/2020 for 3 month follow-up while on herceptin chemotherapy which showed EF 62%, normal LV diastolic function, normal RV function, mild MR, mild-moderate TR, and moderate pulmonary HTN.  Last dose of Herceptin was 09/27/2020. Her last ischemic evaluation appears to be a NST in 2010 which showed no evidence of ischemia. She had serial CT PE studies.  One on 07/20/2020 with no PE but bilateral ground glass and intralobular septal thickening and trace bilateral pleural effusions, subsegmental atelectasis.    She has been undergoing neoadjuvant chemotherapy since 03/2020 and has now completed 6 cycles of TCR-P as of 4 weeks ago.  She was recently admitted on 10/03/20 with acute systolic  CHF.  Her initial workup/initial presentation was significant for markedly elevated bp up to 210/119,  BNP 97, Hstrop 34>39>70>69>69, TSH wnl, A1C 6.4. EKG showed sinus rhythm with rate 111 bpm, non-specific T wave abnormalities, no STE/D; no change from previous. CXR showed diffuse interstitial pulmonary opacity similar to previous. CTA chest showed no PE with evidence of  parenchymal scarring and atelectasis. She was admitted to medicine. Echocardiogram obtained showing EF 25-30%, moderate concentric LVH, global hypokinesis, mildly reduced RV systolic function, and mild-moderate MR, dilated IVC with <50% respiratory variability.  Cardiology was consulted. 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  She was given one dose of IV lasix 40mg , then transitioned to oral 40mg  daily.  She was started on GDMT and ultimately discharged on entresto 24/26, coreg 3.125, asa 81 and lasix 40 daily.  Her oxygen requirement was tested before discharge she was 93% at rest and 88% with exertion and sent home with 2L [place] supplemental oxygen.  It is difficult to tell by documented weights what her weight trend was probably around mid to upper 190's on discharge.    Patient reports she slept well last night, slept with her oxygen but felt like nothing was coming out.  She also felt short of breath and experienced chest tightness this morning which prompted her to seek care in the ED.  Her ED workup was significant for normal bnp, borderline abnormal HS troponin x2 at 18, chest x-ray with progressive bibasilar airspace disease/atelectasis.      Review of Systems: [y] = yes, [ ]  = no   General: Weight gain [ ] ; Weight loss [ ] ; Anorexia [ ] ; Fatigue Blue.Reese ]; Fever [ ] ; Chills [ ] ; Weakness [ ]   Cardiac: Chest pain/pressure [ ] ; Resting SOB Blue.Reese ]; Exertional SOB Blue.Reese ]; Orthopnea Blue.Reese ]; Pedal Edema [ ] ; Palpitations [ ] ; Syncope [ ] ; Presyncope [ ] ; Paroxysmal nocturnal dyspnea[ ]   Pulmonary: Cough [ ] ; Wheezing[ ] ; Hemoptysis[ ] ; Sputum [ ] ; Snoring [ ]   GI: Vomiting[ ] ; Dysphagia[ ] ; Melena[ ] ; Hematochezia [ ] ; Heartburn[ ] ; Abdominal pain [ ] ; Constipation [ ] ; Diarrhea [ ] ; BRBPR [ ]   GU: Hematuria[ ] ; Dysuria [ ] ; Nocturia[ ]   Vascular: Pain in legs with walking [ ] ; Pain in feet with lying flat [ ] ; Non-healing sores [ ] ; Stroke [ ] ; TIA [ ] ; Slurred speech [ ] ;   Neuro: Headaches[ ] ; Vertigo[ ] ; Seizures[ ] ; Paresthesias[ ] ;Blurred vision [ ] ; Diplopia [ ] ; Vision changes [ ]   Ortho/Skin: Arthritis Blue.Reese ]; Joint pain [ ] ; Muscle pain [ ] ; Joint swelling [ ] ; Back Pain [ ] ; Rash [ ]   Psych: Depression[ ] ; Anxiety[ ]   Heme: Bleeding problems [ ] ; Clotting disorders [ ] ; Anemia [ ]   Endocrine: Diabetes [ ] ; Thyroid dysfunction[ ]   Home Medications Prior to Admission medications   Medication Sig Start Date End Date Taking? Authorizing Provider  aspirin 81 MG chewable tablet Chew 1 tablet (81 mg total) by mouth daily. 10/07/20  Yes Little Ishikawa, MD  carvedilol (COREG) 3.125 MG tablet Take 1 tablet (3.125 mg total) by mouth 2 (two) times daily with a meal. 10/07/20  Yes Little Ishikawa, MD  furosemide (LASIX) 40 MG tablet Take 1 tablet (40 mg total) by mouth daily. 10/07/20  Yes Little Ishikawa, MD  sacubitril-valsartan (ENTRESTO) 24-26 MG Take 1 tablet by mouth 2 (two) times daily. 10/07/20  Yes Little Ishikawa, MD    Past Medical History: Past Medical History:  Diagnosis Date  . Breast cancer ([place])    undergoing chemothearpy   . DM type 2 (diabetes mellitus, type 2) ([place])    reportedly steroid induced  . Hypertension     Past Surgical History: History reviewed. No pertinent surgical history.  Family History: Family History  Problem Relation Age of Onset  . Heart failure Neg Hx   . Heart disease Neg Hx   . Heart attack Neg Hx     Social History: Social History   Socioeconomic History  . Marital status: Single    Spouse name: Not on file  . Number of children: Not on file  . Years of education: Not on file  . Highest education level: Not on file  Occupational History  . Not on file  Tobacco Use  . Smoking status: Never Smoker  . Smokeless tobacco: Never Used  Vaping Use  . Vaping Use: Never used  Substance and Sexual Activity  . Alcohol use: Never  . Drug use: Never  . Sexual activity: Not on file  Other  Topics Concern  . Not on file  Social History Narrative  . Not on file   Social Determinants of Health   Financial Resource Strain: Not on file  Food Insecurity: Not on file  Transportation Needs: Not on file  Physical Activity: Not on file  Stress: Not on file  Social Connections: Not on file    Allergies:  Allergies  Allergen Reactions  . Doxycycline Rash  . Amoxicillin-Pot Clavulanate Rash  . Lisinopril Rash    Objective:    Vital Signs:   Temp:  [98.7 F (37.1 C)] 98.7 F (37.1 C) (05/24 0647) Pulse Rate:  [81-95] 90 (05/24 1400) Resp:  [18-24] 24 (05/24 1400) BP: (116-138)/(63-89) 116/63 (05/24 1400) SpO2:  [91 %-100 %] 99 % (05/24 1400)    Weight change: There were no vitals filed for this visit.  Intake/Output:  No intake or output data in the 24 hours ending 10/08/20 1511  Physical Exam    General:  Well appearing. No resp difficulty HEENT: normal Neck: supple. JVP 10-11 . Carotids 2+ bilat; no bruits. No lymphadenopathy or thyromegaly appreciated. Cor: PMI nondisplaced. Regular rate & rhythm. No rubs, gallops or murmurs. Lungs: bibasilar rales, shallow breaths,  Abdomen: soft, nontender, nondistended. No hepatosplenomegaly. No bruits or masses. Good bowel sounds. Extremities: no cyanosis, clubbing, rash, bilateral LE edema 1+ Neuro: alert & orientedx3, cranial nerves grossly intact. moves all 4 extremities w/o difficulty. Affect pleasant   Telemetry   NSR 80's  EKG    NSR rate 89  Labs   Basic Metabolic Panel: Recent Labs  Lab 10/04/20 0150 10/05/20 0108 10/06/20 0142 10/07/20 0233 10/08/20 0653  NA 139 138 137 136 137  K 3.4* 4.1 4.3 3.8 3.9  CL 107 103 98 98 98  CO2 26 29 30  33* 30  GLUCOSE 124* 103* 113* 163* 103*  BUN 6* 5* 9 8 <5*  CREATININE 0.68 0.69 0.75 0.83 0.63  CALCIUM 8.5* 8.9 8.8* 8.7* 8.8*  MG  --  1.6*  --   --   --   PHOS  --  4.9*  --   --   --     Liver Function Tests: No results for input(s): AST, ALT,  ALKPHOS, BILITOT, PROT, ALBUMIN in the last 168 hours. No results for input(s): LIPASE, AMYLASE in the last 168 hours. No results for input(s): AMMONIA in the last 168 hours.  CBC: Recent Labs  Lab 10/03/20 1353 10/04/20 0150 10/05/20 0108 10/06/20 0142 10/07/20 0233 10/08/20 0653  WBC 5.7 6.0 5.5 5.3 5.2 4.1  NEUTROABS 2.7  --   --   --   --   --   HGB 10.8* 9.4* 8.7* 9.8* 9.2* 9.5*  HCT 35.8* 31.9* 28.7* 32.5* 30.7* 31.7*  MCV 95.2 96.4 95.3 95.0 96.8 95.8  PLT 250 220 200 222 229 216    Cardiac Enzymes: No results for input(s): CKTOTAL, CKMB, CKMBINDEX, TROPONINI in the last 168 hours.  BNP: BNP (last 3 results) Recent Labs    09/16/20 2120 10/03/20 1353 10/08/20 0852  BNP 11.4 97.9 42.4    ProBNP (last 3 results) No results for input(s): PROBNP in the last 8760 hours.   CBG: Recent Labs  Lab 10/03/20 1242 10/04/20 0617  GLUCAP 153* 98    Coagulation Studies: No results for input(s): LABPROT, INR in the last 72 hours.   Imaging   DG Chest 2 View  Result Date: 10/08/2020 CLINICAL DATA:  Chest pain.  History of breast cancer. EXAM: CHEST - 2 VIEW COMPARISON:  Chest x-ray 10/03/2020.  CT 10/03/2020. FINDINGS: PowerPort catheter with tip over SVC. Heart size normal. Progressive bibasilar atelectasis/infiltrates. No pleural effusion or pneumothorax. Degenerative change and scoliosis thoracic spine. IMPRESSION: 1.  PowerPort catheter with tip over SVC. 2.  Progressive bibasilar atelectasis/infiltrates. 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 -normal ECHO 07/2020 although did have signs of basilar ground glass and pleural effusions on CTA which preceded ECHO -ECHO 09/2020 with EF 25-30%, moderate concentric  LVH, global hypokinesis, mildly reduced RV systolic function, and mild-moderate MR, dilated IVC with <50% respiratory variability -normal nuclear stress test 2019 -Last dose of Herceptin was 09/27/2020 -  NYHA class 3 symptoms, volume up on exam REDS 45%, IVC dilated -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  T2DM: -A1C 6.4 on 5/20 -sglt2i eventually   Length of Stay: 0  Katherine Roan, MD  10/08/2020, 3:11 PM  Advanced Heart Failure Team Pager [phone] (M-F; 7a - 4p)  Please contact [facility] for night-coverage after hours (4p -7a ) and weekends on [email or web address]  Patient seen and examined with the above-signed Advanced Practice Provider and/or Housestaff. I personally reviewed laboratory data, imaging studies and relevant notes. I independently examined the patient and formulated the important aspects of the plan. I have edited the note to reflect any of my changes or salient points. I have personally discussed the plan with the patient and/or family.  65 y/o woman with breast CA, DM2, former smoker. Recently admitted with new onset systolic HF with EF 55-73%. Now readmitted with chest pain and recurrent HF symptoms with evidence of volume overload on exam and REDs reading of 45% (severe pulmonary congestion).  General:  Elderly appearing No resp difficulty HEENT: normal Neck: supple. JVP 8-9  Carotids 2+ bilat; no bruits. No lymphadenopathy or thryomegaly appreciated. Cor: PMI nondisplaced. Regular rate & rhythm. No rubs, gallops or  murmurs. Lungs: clear Abdomen: soft, nontender, nondistended. No hepatosplenomegaly. No bruits or masses. Good bowel sounds. Extremities: no cyanosis, clubbing, rash, edema Neuro: alert & orientedx3, cranial nerves grossly intact. moves all 4 extremities w/o difficulty. Affect pleasant  Certainly could have herceptin related cardiomyopathy but would be unusual to see this much of a drop in EF so quickly with Herceptin. Will IV diurese. Adjust GDMT. Plan R/L cath on Thursday.  Glori Bickers, MD  4:12 PM

## 2020-10-08 NOTE — ED Triage Notes (Signed)
Pt states that when she woke up she was SOB and had chest heaviness. Denies n/v

## 2020-10-08 NOTE — Progress Notes (Signed)
ReDS Vest / Clip - 10/08/20 1400      ReDS Vest / Clip   Station Marker C    Ruler Value 26    ReDS Value Range High volume overload    ReDS Actual Value 45

## 2020-10-08 NOTE — ED Provider Notes (Signed)
[facility] EMERGENCY DEPARTMENT Provider Note   CSN: [id number] Arrival date & time: 10/08/20  3536     History Chief Complaint  Patient presents with  . Chest Pain    Marie Church is a 65 y.o. female who presents with concern for worsening shortness of breath and lower chest/epigastric pressure and tightness with associated nausea. Patient lives in [place] and is currently on Herceptin for maintenance therapy for breast cancer.  She is staying in [place] at this time with her son and daughter as she has been feeling poorly.  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  LVEF was 20% and patient had new oxygen requirement of 2 L.  When she was discharged yesterday she was no longer requiring oxygen during the day, was discharged with supplemental O2 to be used at nighttime as needed. Patient states that she was having increased work of breathing this morning at home and did not feel any relief from supplemental oxygen.  I have personally reviewed this patient's medical records.  She has history of hypertension, type 2 diabetes, breast cancer with most recent Herceptin treatment 2 weeks ago.  Additionally has anemia of chronic disease.  HPI     Past Medical History:  Diagnosis Date  . Breast cancer ([place])    undergoing chemothearpy   . DM type 2 (diabetes mellitus, type 2) ([place])    reportedly steroid induced  . Hypertension     Patient Active Problem List   Diagnosis Date Noted  . Dyspnea 10/08/2020  . Hypoxia 10/03/2020  . Hypertensive urgency 10/03/2020  . Shortness of breath 10/03/2020  . Elevated troponin 10/03/2020  . Severe protein-calorie malnutrition ([place]) 08/16/2020  . Ulcer of right groin, limited to breakdown of skin ([place]) 08/08/2020  . Anemia of chronic disease 07/31/2020  . GERD  without esophagitis 07/31/2020  . Type 2 diabetes mellitus without complication, without long-term current use of insulin ([place]) 07/31/2020  . Bilateral pneumonia 07/19/2020  . Breast cancer ([place]) 04/03/2020  . Malignant neoplasm of upper-outer quadrant of right breast in female, estrogen receptor negative ([place]) 03/18/2020  . Sepsis ([place]) 03/14/2020  . Axillary adenopathy 03/12/2020  . Macrocytic anemia 02/29/2020  . Physical debility 01/30/2020  . 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 Difficulty in walking 03/25/2018  . Osteoarthritis of knee 03/25/2018  . Arthritis 01/28/2018  . Essential hypertension 01/28/2018  . Bilateral knee pain 12/22/2011  . Dizziness 12/22/2011  . Eustachian tube dysfunction 12/22/2011  . Otalgia 12/22/2011  . Sinusitis 12/22/2011  . Sore throat 12/22/2011    History reviewed. No pertinent surgical history.   OB History   No obstetric history on file.     Family History  Problem Relation Age of Onset  . Heart failure Neg Hx   . Heart disease Neg Hx   . Heart attack Neg Hx     Social History   Tobacco Use  . Smoking status: Never Smoker  . Smokeless tobacco: Never Used  Vaping Use  . Vaping Use: Never used  Substance Use Topics  . Alcohol use: Never  . Drug use: Never    Home Medications Prior to Admission medications   Medication Sig Start Date End Date Taking? Authorizing Provider  aspirin 81 MG chewable tablet Chew 1 tablet (81 mg total) by mouth daily. 10/07/20  Yes Little Ishikawa, MD  carvedilol (COREG)  3.125 MG tablet Take 1 tablet (3.125 mg total) by mouth 2 (two) times daily with a meal. 10/07/20  Yes Little Ishikawa, MD  furosemide (LASIX) 40 MG tablet Take 1 tablet (40 mg total) by mouth daily. 10/07/20  Yes Little Ishikawa, MD  sacubitril-valsartan (ENTRESTO) 24-26 MG Take 1 tablet by mouth 2 (two) times daily. 10/07/20  Yes Little Ishikawa, MD    Allergies    Doxycycline, Amoxicillin-pot clavulanate, and  Lisinopril  Review of Systems   Review of Systems  Constitutional: Positive for activity change and fatigue. Negative for appetite change, chills, diaphoresis, fever and unexpected weight change.  HENT: Negative.   Eyes: Negative.   Respiratory: Positive for chest tightness and shortness of breath. Negative for cough and wheezing.   Cardiovascular: Negative for chest pain, palpitations and leg swelling.  Gastrointestinal: Negative.   Genitourinary: Negative.   Musculoskeletal: Negative.   Skin: Negative.   Allergic/Immunologic: Positive for immunocompromised state.       Breast cancer on Herceptin  Neurological: Negative.   Hematological: Negative.     Physical Exam Updated Vital Signs BP 116/63   Pulse 90   Temp 98.1 F (36.7 C) (Oral)   Resp (!) 24   SpO2 99%   Physical Exam Vitals and nursing note reviewed.  Constitutional:      Appearance: She is obese. She is ill-appearing. She is not toxic-appearing.     Interventions: Nasal cannula in place.  HENT:     Head: Normocephalic and atraumatic.     Nose: Nose normal.     Mouth/Throat:     Mouth: Mucous membranes are moist.     Pharynx: Oropharynx is clear. Uvula midline. No oropharyngeal exudate, posterior oropharyngeal erythema or uvula swelling.     Tonsils: No tonsillar exudate.  Eyes:     General: Lids are normal. Vision grossly intact.        Right eye: No discharge.        Left eye: No discharge.     Extraocular Movements: Extraocular movements intact.     Conjunctiva/sclera: Conjunctivae normal.     Pupils: Pupils are equal, round, and reactive to light.  Neck:     Vascular: JVD present.     Trachea: Trachea and phonation normal.  Cardiovascular:     Rate and Rhythm: Normal rate and regular rhythm.     Pulses: Normal pulses.     Heart sounds: Normal heart sounds. No murmur heard.   Pulmonary:     Effort: Pulmonary effort is normal. Tachypnea present. No accessory muscle usage, prolonged expiration or  respiratory distress.     Breath sounds: Normal breath sounds. No wheezing or rales.     Comments: Requiring 3 L by nasal cannula to mean oxygen saturations greater than 90% Chest:     Chest wall: No mass, lacerations, deformity, swelling, tenderness, crepitus or edema.  Abdominal:     General: Bowel sounds are normal. There is no distension.     Palpations: Abdomen is soft.     Tenderness: There is no abdominal tenderness. There is no right CVA tenderness, left CVA tenderness, guarding or rebound.  Musculoskeletal:        General: No deformity.     Cervical back: Normal range of motion and neck supple. No edema, rigidity or crepitus. No pain with movement, spinous process tenderness or muscular tenderness.     Right lower leg: 2+ Edema present.     Left lower leg: 2+ Edema present.  Lymphadenopathy:     Cervical: No cervical adenopathy.  Skin:    General: Skin is warm and dry.  Neurological:     Mental Status: She is alert. Mental status is at baseline.     Gait: Gait is intact.  Psychiatric:        Mood and Affect: Mood normal.     ED Results / Procedures / Treatments   Labs (all labs ordered are listed, but only abnormal results are displayed) Labs Reviewed  BASIC METABOLIC PANEL - Abnormal; Notable for the following components:      Result Value   Glucose, Bld 103 (*)    BUN <5 (*)    Calcium 8.8 (*)    All other components within normal limits  CBC - Abnormal; Notable for the following components:   RBC 3.31 (*)    Hemoglobin 9.5 (*)    HCT 31.7 (*)    All other components within normal limits  TROPONIN I (HIGH SENSITIVITY) - Abnormal; Notable for the following components:   Troponin I (High Sensitivity) 18 (*)    All other components within normal limits  TROPONIN I (HIGH SENSITIVITY) - Abnormal; Notable for the following components:   Troponin I (High Sensitivity) 18 (*)    All other components within normal limits  BRAIN NATRIURETIC PEPTIDE     EKG None  Radiology DG Chest 2 View  Result Date: 10/08/2020 CLINICAL DATA:  Chest pain.  History of breast cancer. EXAM: CHEST - 2 VIEW COMPARISON:  Chest x-ray 10/03/2020.  CT 10/03/2020. FINDINGS: PowerPort catheter with tip over SVC. Heart size normal. Progressive bibasilar atelectasis/infiltrates. No pleural effusion or pneumothorax. Degenerative change and scoliosis thoracic spine. IMPRESSION: 1.  PowerPort catheter with tip over SVC. 2.  Progressive bibasilar atelectasis/infiltrates. Electronically Signed   By: Marcello Moores  Register   On: 10/08/2020 07:14    Procedures Procedures   Medications Ordered in ED Medications  aspirin chewable tablet 81 mg (has no administration in time range)  sacubitril-valsartan (ENTRESTO) 24-26 mg per tablet (has no administration in time range)  spironolactone (ALDACTONE) tablet 12.5 mg (has no administration in time range)  furosemide (LASIX) injection 60 mg (has no administration in time range)  sodium chloride flush (NS) 0.9 % injection 3 mL (has no administration in time range)  sodium chloride flush (NS) 0.9 % injection 3 mL (has no administration in time range)  0.9 %  sodium chloride infusion (has no administration in time range)  acetaminophen (TYLENOL) tablet 650 mg (has no administration in time range)  ondansetron (ZOFRAN) injection 4 mg (has no administration in time range)  enoxaparin (LOVENOX) injection 40 mg (has no administration in time range)    ED Course  I have reviewed the triage vital signs and the nursing notes.  Pertinent labs & imaging results that were available during my care of the patient were reviewed by me and considered in my medical decision making (see chart for details).  Clinical Course as of 10/08/20 1544  Tue Oct 08, 2020  0942 Consult call received from cardiology coordinator, Wannetta Sender, states that the cardiology team will take a look at the patient's case. [RS]  9233 Case discussed with cardiologist, Dr.  Jeffie Pollock, who recommends medical admission. HF team will follow, will likely need catheterization for further work up.  [RS]  0076 Consult to hospitalist, Dr. Tamala Julian, who is agreeable to seeing this patient admitting her to his service.  Appreciate his collaboration in the care of this patient. [RS]  Clinical Course User Index [RS] Azalia Neuberger, Sharlene Dory   MDM Rules/Calculators/A&P                          65 year old female who presents with concern for progressively worsening shortness of breath after being discharged yesterday.   Patient was satting 90% on room air on intake and was placed on 3 L for saturations above 90%.  Vital signs are otherwise normal.  Cardiopulmonary exam revealed rales in the lung bases bilaterally, abdominal exam is benign.  There is bilateral lower extremity edema that is nonpitting.  ASIC work-up obtained in triage.  CBC with baseline anemia of hemoglobin 9.5.  BMP unremarkable, troponin is elevated 18, however improved from Troponin 4 days ago which was 65.   Chest x-ray with progressive bibasilar atelectasis and infiltrates, EKG with normal sinus rhythm, no STEMI.  Consult placed to cardiology who feel patient would benefit from readmission to the hospital for possible catheterization.  Patient is amenable to plan for admission at this time.  Hospitalist consulted as above, agreeable to admitting patient to their service.  No further work-up warranted in the ED at this time.  Deondrea voiced understanding of her medical evaluation and treatment plan.  Each of her questions was answered to her expressed satisfaction.  She is amenable to plan for admission at this time.  This chart was dictated using voice recognition software, Dragon. Despite the best efforts of this provider to proofread and correct errors, errors may still occur which can change documentation meaning.  Final Clinical Impression(s) / ED Diagnoses Final diagnoses:  None    Rx / DC  Orders ED Discharge Orders    None       Aura Dials 10/08/20 1544    Breck Coons, MD 10/09/20 [phone]

## 2020-10-08 NOTE — H&P (Signed)
History and Physical    Marie Church TKP:[id number] DOB: 1955/09/18 DOA: 10/08/2020  Referring MD/NP/PA: Rod Can, PA-C PCP: System, Provider Not In  Patient coming from: Home  Chief Complaint: Shortness of breath  I have personally briefly reviewed patient's old medical records in [place]   HPI: Marie Church is a 65 y.o. female with medical history significant of hypertension, Herceptin related cardiomyopathy, systolic CHF, diabetes mellitus type 2, breast cancer receiving chemotherapy, and anemia presents with complaints of shortness of breath.  Patient had just recently been hospitalized from 5/17-0/01, with systolic CHF exacerbation where EF was noted to be 25 to 30% thought to be secondary to Herceptin induced cardiomyopathy.  During the hospitalization patient remained hypoxic and rest despite diuresis and was discharged home on nocturnal oxygen of 2 L with plans for outpatient sleep study.  She had been discharged home on Entresto, Coreg, and furosemide.  This morning when she woke up she reported feeling short of breath with increased tightness epigastrically.  She continue to wear her supplemental oxygen, but reported that it provided no relief in her symptoms.  Noted associated symptoms of a dry cough, headache, and continued leg swelling.  Denies having any fever, palpitations, chest pain, nausea, vomiting, diarrhea, or blood in stools.   ED Course: Upon admission into the emergency department patient was seen to afebrile, respirations 16-24, heart rates 81-100, blood pressure 88/61-138/89, and O2 saturations currently maintained on 2 L nasal cannula oxygen.  Labs significant for hemoglobin 9.5, high-sensitivity troponin 18-> 18, and BNP 42.4.  Chest x-ray revealed progressive bibasilar atelectasis/infiltrates with power port catheter tip over SVC.  Patient was seen by the heart failure team.  She was given aspirin 81 mg, furosemide 60 mg IV twice daily,  spironolactone 12.5 mg daily, and Entresto.  Review of Systems  Constitutional: Positive for malaise/fatigue. Negative for fever.  HENT: Negative for nosebleeds and sinus pain.   Eyes: Negative for double vision and photophobia.  Respiratory: Positive for cough and shortness of breath. Negative for sputum production.   Cardiovascular: Positive for leg swelling. Negative for chest pain and palpitations.  Gastrointestinal: Negative for abdominal pain, diarrhea and vomiting.  Genitourinary: Negative for dysuria and hematuria.  Musculoskeletal: Negative for falls and myalgias.  Skin: Negative for rash.  Neurological: Negative for focal weakness and loss of consciousness.  Endo/Heme/Allergies: Negative for polydipsia.  Psychiatric/Behavioral: Negative for substance abuse. The patient does not have insomnia.     Past Medical History:  Diagnosis Date  . Breast cancer ([employer])    undergoing chemothearpy   . DM type 2 (diabetes mellitus, type 2) ([place])    reportedly steroid induced  . Hypertension     History reviewed. No pertinent surgical history.   reports that she has never smoked. She has never used smokeless tobacco. She reports that she does not drink alcohol and does not use drugs.  Allergies  Allergen Reactions  . Doxycycline Rash  . Amoxicillin-Pot Clavulanate Rash  . Lisinopril Rash    Family History  Problem Relation Age of Onset  . Heart failure Neg Hx   . Heart disease Neg Hx   . Heart attack Neg Hx     Prior to Admission medications   Medication Sig Start Date End Date Taking? Authorizing Provider  aspirin 81 MG chewable tablet Chew 1 tablet (81 mg total) by mouth daily. 10/07/20  Yes Little Ishikawa, MD  carvedilol (COREG) 3.125 MG tablet Take 1 tablet (3.125 mg total) by mouth 2 (  two) times daily with a meal. 10/07/20  Yes Little Ishikawa, MD  furosemide (LASIX) 40 MG tablet Take 1 tablet (40 mg total) by mouth daily. 10/07/20  Yes Little Ishikawa, MD   sacubitril-valsartan (ENTRESTO) 24-26 MG Take 1 tablet by mouth 2 (two) times daily. 10/07/20  Yes Little Ishikawa, MD    Physical Exam:  Constitutional: Older female who appears to be no acute distress at this Vitals:   10/08/20 1100 10/08/20 1200 10/08/20 1300 10/08/20 1400  BP: 124/74 125/77 129/73 116/63  Pulse: 82 88 88 90  Resp: (!) 21 19 (!) 23 (!) 24  Temp:      SpO2: 100% 100% 99% 99%   Eyes: PERRL, lids and conjunctivae normal ENMT: Mucous membranes are moist. Posterior pharynx clear of any exudate or lesions.  Neck: normal, supple, no masses, no thyromegaly.  JVD appreciated Respiratory: Appreciated intermittent fine crackles, but no significant wheezes or rhonchi appreciated.  Patient currently on 2 L nasal cannula oxygen with O2 saturations maintained.  Able to talk in fairly complete sentences. Cardiovascular: Regular rate and rhythm, no murmurs / rubs / gallops.  +1 pitting bilateral lower extremity edema. 2+ pedal pulses. No carotid bruits.  Port in place. Abdomen: no tenderness, no masses palpated. No hepatosplenomegaly. Bowel sounds positive.  Musculoskeletal: no clubbing / cyanosis. No joint deformity upper and lower extremities. Good ROM, no contractures. Normal muscle tone.  Skin: no rashes, lesions, ulcers. No induration Neurologic: CN 2-12 grossly intact. Sensation intact, DTR normal. Strength 5/5 in all 4.  Psychiatric: Normal judgment and insight. Alert and oriented x 3. Normal mood.     Labs on Admission: I have personally reviewed following labs and imaging studies  CBC: Recent Labs  Lab 10/03/20 1353 10/04/20 0150 10/05/20 0108 10/06/20 0142 10/07/20 0233 10/08/20 0653  WBC 5.7 6.0 5.5 5.3 5.2 4.1  NEUTROABS 2.7  --   --   --   --   --   HGB 10.8* 9.4* 8.7* 9.8* 9.2* 9.5*  HCT 35.8* 31.9* 28.7* 32.5* 30.7* 31.7*  MCV 95.2 96.4 95.3 95.0 96.8 95.8  PLT 250 220 200 222 229 527   Basic Metabolic Panel: Recent Labs  Lab 10/04/20 0150  10/05/20 0108 10/06/20 0142 10/07/20 0233 10/08/20 0653  NA 139 138 137 136 137  K 3.4* 4.1 4.3 3.8 3.9  CL 107 103 98 98 98  CO2 26 29 30  33* 30  GLUCOSE 124* 103* 113* 163* 103*  BUN 6* 5* 9 8 <5*  CREATININE 0.68 0.69 0.75 0.83 0.63  CALCIUM 8.5* 8.9 8.8* 8.7* 8.8*  MG  --  1.6*  --   --   --   PHOS  --  4.9*  --   --   --    GFR: Estimated Creatinine Clearance: 83.6 mL/min (by C-G formula based on SCr of 0.63 mg/dL). Liver Function Tests: No results for input(s): AST, ALT, ALKPHOS, BILITOT, PROT, ALBUMIN in the last 168 hours. No results for input(s): LIPASE, AMYLASE in the last 168 hours. No results for input(s): AMMONIA in the last 168 hours. Coagulation Profile: No results for input(s): INR, PROTIME in the last 168 hours. Cardiac Enzymes: No results for input(s): CKTOTAL, CKMB, CKMBINDEX, TROPONINI in the last 168 hours. BNP (last 3 results) No results for input(s): PROBNP in the last 8760 hours. HbA1C: No results for input(s): HGBA1C in the last 72 hours. CBG: Recent Labs  Lab 10/03/20 1242 10/04/20 0617  GLUCAP 153* 98  Lipid Profile: No results for input(s): CHOL, HDL, LDLCALC, TRIG, CHOLHDL, LDLDIRECT in the last 72 hours. Thyroid Function Tests: No results for input(s): TSH, T4TOTAL, FREET4, T3FREE, THYROIDAB in the last 72 hours. Anemia Panel: No results for input(s): VITAMINB12, FOLATE, FERRITIN, TIBC, IRON, RETICCTPCT in the last 72 hours. Urine analysis: No results found for: COLORURINE, APPEARANCEUR, LABSPEC, PHURINE, GLUCOSEU, HGBUR, BILIRUBINUR, KETONESUR, PROTEINUR, UROBILINOGEN, NITRITE, LEUKOCYTESUR Sepsis Labs: Recent Results (from the past 240 hour(s))  Resp Panel by RT-PCR (Flu A&B, Covid) Nasopharyngeal Swab     Status: None   Collection Time: 10/03/20  6:28 PM   Specimen: Nasopharyngeal Swab; Nasopharyngeal(NP) swabs in vial transport medium  Result Value Ref Range Status   SARS Coronavirus 2 by RT PCR NEGATIVE NEGATIVE Final    Comment:  (NOTE) SARS-CoV-2 target nucleic acids are NOT DETECTED.  The SARS-CoV-2 RNA is generally detectable in upper respiratory specimens during the acute phase of infection. The lowest concentration of SARS-CoV-2 viral copies this assay can detect is 138 copies/mL. A negative result does not preclude SARS-Cov-2 infection and should not be used as the sole basis for treatment or other patient management decisions. A negative result may occur with  improper specimen collection/handling, submission of specimen other than nasopharyngeal swab, presence of viral mutation(s) within the areas targeted by this assay, and inadequate number of viral copies(<138 copies/mL). A negative result must be combined with clinical observations, patient history, and epidemiological information. The expected result is Negative.  Fact Sheet for Patients:  EntrepreneurPulse.com.au  Fact Sheet for Healthcare Providers:  IncredibleEmployment.be  This test is no t yet approved or cleared by the [place] FDA and  has been authorized for detection and/or diagnosis of SARS-CoV-2 by FDA under an Emergency Use Authorization (EUA). This EUA will remain  in effect (meaning this test can be used) for the duration of the COVID-19 declaration under Section 564(b)(1) of the Act, 21 U.S.C.section 360bbb-3(b)(1), unless the authorization is terminated  or revoked sooner.       Influenza A by PCR NEGATIVE NEGATIVE Final   Influenza B by PCR NEGATIVE NEGATIVE Final    Comment: (NOTE) The Xpert Xpress SARS-CoV-2/FLU/RSV plus assay is intended as an aid in the diagnosis of influenza from Nasopharyngeal swab specimens and should not be used as a sole basis for treatment. Nasal washings and aspirates are unacceptable for Xpert Xpress SARS-CoV-2/FLU/RSV testing.  Fact Sheet for Patients: EntrepreneurPulse.com.au  Fact Sheet for Healthcare  Providers: IncredibleEmployment.be  This test is not yet approved or cleared by the [place] FDA and has been authorized for detection and/or diagnosis of SARS-CoV-2 by FDA under an Emergency Use Authorization (EUA). This EUA will remain in effect (meaning this test can be used) for the duration of the COVID-19 declaration under Section 564(b)(1) of the Act, 21 U.S.C. section 360bbb-3(b)(1), unless the authorization is terminated or revoked.  Performed at [facility], [place] [address], [place], [place] [address]      Radiological Exams on Admission: DG Chest 2 View  Result Date: 10/08/2020 CLINICAL DATA:  Chest pain.  History of breast cancer. EXAM: CHEST - 2 VIEW COMPARISON:  Chest x-ray 10/03/2020.  CT 10/03/2020. FINDINGS: PowerPort catheter with tip over SVC. Heart size normal. Progressive bibasilar atelectasis/infiltrates. No pleural effusion or pneumothorax. Degenerative change and scoliosis thoracic spine. IMPRESSION: 1.  PowerPort catheter with tip over SVC. 2.  Progressive bibasilar atelectasis/infiltrates. Electronically Signed   By: Marcello Moores  Register   On: 10/08/2020 07:14    EKG: Independently  reviewed.  Normal sinus rhythm at 89 bpm  Assessment/Plan Acute on chronic respiratory failure with hypoxia secondary to systolic congestive heart failure exacerbation: Acute on chronic.  Patient presents with complaints of shortness of breath after waking up this morning.  BNP low at 42.4, but patient has at least 1+ pitting edema on lower extremities with JVD appreciated.  Chest x-ray noted progressive bibasilar atelectasis/infiltrates. -Admit to a progressive bed  -Heart failure order set -Strict intake and output -Daily weights -Lasix 60 mg IV twice daily -Continue Entresto and  Spironolactone started per cardiology -Held beta-blocker due to acute decompensation. -Appreciate cardiology consultative services plan for heart cath on 5/26  Elevated  troponin: High-sensitivity troponins were flat at 18.  Suspect secondary to demand.  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 She had last received Herceptin on 09/27/2020. -Cardiology recommended discontinuation of this medication is suspected to be the cause of patient's heart -Continue outpatient follow-up with Dr. Joretta Bachelor at [facility]: Blood pressures have been 88/61-138/89.   Home medication regimen included Coreg 3.125 mg bid, entresto 24-26 mg bid, and furosemide 40 mg daily. -Holding Coreg during acute decompensation -Continue Entresto  Normocytic anemia: Hemoglobin 9.5 g/dL on admission which appears similar to yesterday. -Continue to monitor  Diabetes mellitus type 2: On admission glucose 103.  History of diabetes listed in problem list, but patient not on any medications for treatment.  Hemoglobin A1c was 6.4 on 5/19. -Continue to monitor  Obesity: BMI 31.66 kg/m  DVT prophylaxis: Lovenox Code Status: Full Family Communication: Patient stated no need to update family Disposition Plan: Likely discharge home once medically Consults called: Cardiology Admission status: Inpatient, require more than 2 midnights  Eual Lindstrom Charmayne Sheer MD [facility]   If 7PM-7AM, please contact night-coverage   10/08/2020, 2:52 PM

## 2020-10-08 NOTE — ED Notes (Signed)
Attempted to give reportx1 

## 2020-10-08 NOTE — ED Notes (Signed)
Patient made this RN aware that she took home medication of ENTRESTO.

## 2020-10-08 NOTE — ED Notes (Signed)
Pt got up to the [facility] and voided 500cc

## 2020-10-08 NOTE — ED Notes (Signed)
Pt reports that she was discharged from the hospital yesterday and was admitted for CHF.  Pt states that she woke up this am with sob and chest tightness in her lower/central chest (epigastric) which is less now than when she woke up.  Pt is 90-93% spO2 on RA (she has home o2 to use 2L [place] as needed).  Will apply 2L [place] at this time

## 2020-10-09 ENCOUNTER — Other Ambulatory Visit: Payer: Self-pay

## 2020-10-09 ENCOUNTER — Encounter (HOSPITAL_COMMUNITY): Payer: Self-pay | Admitting: Internal Medicine

## 2020-10-09 DIAGNOSIS — I5023 Acute on chronic systolic (congestive) heart failure: Secondary | ICD-10-CM

## 2020-10-09 LAB — BASIC METABOLIC PANEL
Anion gap: 10 (ref 5–15)
BUN: <5 mg/dL — ABNORMAL LOW (ref 8–23)
CO2: 35 mmol/L — ABNORMAL HIGH (ref 22–32)
Calcium: 8.9 mg/dL (ref 8.9–10.3)
Chloride: 95 mmol/L — ABNORMAL LOW (ref 98–111)
Creatinine, Ser: 0.63 mg/dL (ref 0.44–1.00)
GFR, Estimated: >60 mL/min (ref >60)
Glucose, Bld: 128 mg/dL — ABNORMAL HIGH (ref 70–99)
Potassium: 3.3 mmol/L — ABNORMAL LOW (ref 3.5–5.1)
Sodium: 140 mmol/L (ref 135–145)

## 2020-10-09 LAB — CBC
HCT: 32 % — ABNORMAL LOW (ref 36.0–46.0)
Hemoglobin: 9.7 g/dL — ABNORMAL LOW (ref 12.0–15.0)
MCH: 28.9 pg (ref 26.0–34.0)
MCHC: 30.3 g/dL (ref 30.0–36.0)
MCV: 95.2 fL (ref 80.0–100.0)
Platelets: 227 10*3/uL (ref 150–400)
RBC: 3.36 MIL/uL — ABNORMAL LOW (ref 3.87–5.11)
RDW: 15.4 % (ref 11.5–15.5)
WBC: 4.3 10*3/uL (ref 4.0–10.5)
nRBC: 0 % (ref 0.0–0.2)

## 2020-10-09 LAB — MAGNESIUM: Magnesium: 1.8 mg/dL (ref 1.7–2.4)

## 2020-10-09 MED ORDER — SODIUM CHLORIDE 0.9 % IV SOLN: INTRAVENOUS | Status: DC

## 2020-10-09 MED ORDER — POTASSIUM CHLORIDE CRYS ER 20 MEQ PO TBCR: 40.0000 meq | EXTENDED_RELEASE_TABLET | Freq: Every day | ORAL | Status: DC

## 2020-10-09 MED ORDER — SODIUM CHLORIDE 0.9 % IV SOLN: 250.0000 mL | INTRAVENOUS | Status: DC | PRN

## 2020-10-09 MED ORDER — SODIUM CHLORIDE 0.9% FLUSH: 3.0000 mL | Freq: Two times a day (BID) | INTRAVENOUS | Status: DC

## 2020-10-09 MED ORDER — ASPIRIN 81 MG PO CHEW
81.0000 mg | CHEWABLE_TABLET | ORAL | Status: AC
  Administered 2020-10-10: 81 mg via ORAL

## 2020-10-09 MED ORDER — MAGNESIUM SULFATE 2 GM/50ML IV SOLN
2.0000 g | Freq: Once | INTRAVENOUS | Status: AC
  Administered 2020-10-09: 2 g via INTRAVENOUS

## 2020-10-09 MED ORDER — SODIUM CHLORIDE 0.9% FLUSH: 3.0000 mL | INTRAVENOUS | Status: DC | PRN

## 2020-10-09 MED ORDER — POTASSIUM CHLORIDE CRYS ER 20 MEQ PO TBCR
40.0000 meq | EXTENDED_RELEASE_TABLET | Freq: Three times a day (TID) | ORAL | Status: DC
  Administered 2020-10-09 (×2): 40 meq via ORAL
  Filled 2020-10-09 (×3): qty 2

## 2020-10-09 MED ORDER — MELATONIN 5 MG PO TABS
5.0000 mg | ORAL_TABLET | Freq: Once | ORAL | Status: AC
  Administered 2020-10-09: 5 mg via ORAL
  Filled 2020-10-09: qty 1

## 2020-10-09 NOTE — Hospital Course (Addendum)
65 year old community dwelling female Metastatic breast cancer/neoadjuvant chemo-i metastatic disease (follows Dr. [facility]e [facility] [place]) 02/2020/Taxotere/carboplatin/Herceptin + Perjeta--echocardiogram 07/2020 EF 62% HTN DM TY 2  Recent admission 5/19-->5/22 with SOB, DOE, L LE edema--EF 20-25%--Herceptin DC'd.  DC home Entresto Coreg Lasix--plan was to discharge and follow-up with pulmonology given nocturnal desaturation  Return to emergency room 5/24-SOB, chest tightness Troponin 18 BNP 42 CXR = progressive bibasilar atelectasis Rx aspirin Lasix 60 IV twice daily Aldactone Entresto Beta-blocker held  REDS vest 45  Advanced heart failure team consulted Cath planned for 5/25  I/O- 1260 cc Dry weight about 87 kg-currently 84.6  Potassium 3.3 BUNs/creatinine 5/0.6 CO2 35   Assessment: 1. Normal cors 2. 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 Relatively well compensated hemodynamics with high cardiac output   Plan/Discussion:   Stop IV lasix. Switch to po. Continue to titrate GDMT. Likely can d/c soon.   Entresto 24/26 bid Spiro 12.5 daily Lasix 40 daily Add Farxiga 10

## 2020-10-09 NOTE — Progress Notes (Addendum)
Advanced Heart Failure Team Consult Note   Primary Physician: unknown Primary Cardiologist:  Lyman Bishop  Reason for Consultation: acute systolic CHF  HPI:    Wt down 3lbs overnight, breathing mildly improved.  Denies chest pain, PND, reports good urine output overnight.  Oxygen bothering her nose.   Objective:    Vital Signs:   Temp:  [98.1 F (36.7 C)-98.8 F (37.1 C)] 98.3 F (36.8 C) (05/25 0806) Pulse Rate:  [73-94] 89 (05/25 0806) Resp:  [14-27] 20 (05/25 0806) BP: (95-134)/(62-82) 111/62 (05/25 0806) SpO2:  [82 %-100 %] 99 % (05/25 0806) Weight:  [84.6 kg-85.9 kg] 84.6 kg (05/25 0506) Last BM Date: 10/07/20  Weight change: Filed Weights   10/08/20 2213 10/09/20 0506  Weight: 85.9 kg 84.6 kg    Intake/Output:   Intake/Output Summary (Last 24 hours) at 10/09/2020 0810 Last data filed at 10/09/2020 0300 Gross per 24 hour  Intake 240 ml  Output 1500 ml  Net -1260 ml    Scheduled Meds: . aspirin  81 mg Oral Daily  . enoxaparin (LOVENOX) injection  40 mg Subcutaneous Daily  . furosemide  60 mg Intravenous BID  . potassium chloride  40 mEq Oral TID  . sacubitril-valsartan  1 tablet Oral BID  . sodium chloride flush  3 mL Intravenous Q12H  . spironolactone  12.5 mg Oral Daily   Continuous Infusions: . sodium chloride     PRN Meds:.sodium chloride, acetaminophen, ondansetron (ZOFRAN) IV, sodium chloride flush   Physical Exam    Cardiac: JVD 10, normal rate and rhythm, clear s1 and s2, no murmurs, rubs or gallops, 1-2+ LE edema Pulmonary: bibasilar rales, not in distress Abdominal: non distended abdomen, soft and nontender Psych: Alert, conversant, in good spirits   Telemetry   NSR 80's  EKG    No new ecg  Labs   Basic Metabolic Panel: Recent Labs  Lab 10/05/20 0108 10/06/20 0142 10/07/20 0233 10/08/20 0653 10/09/20 0245  NA 138 137 136 137 140  K 4.1 4.3 3.8 3.9 3.3*  CL 103 98 98 98 95*  CO2 29 30 33* 30 35*  GLUCOSE 103* 113*  163* 103* 128*  BUN 5* 9 8 <5* <5*  CREATININE 0.69 0.75 0.83 0.63 0.63  CALCIUM 8.9 8.8* 8.7* 8.8* 8.9  MG 1.6*  --   --   --  1.8  PHOS 4.9*  --   --   --   --     Liver Function Tests: No results for input(s): AST, ALT, ALKPHOS, BILITOT, PROT, ALBUMIN in the last 168 hours. No results for input(s): LIPASE, AMYLASE in the last 168 hours. No results for input(s): AMMONIA in the last 168 hours.  CBC: Recent Labs  Lab 10/03/20 1353 10/04/20 0150 10/05/20 0108 10/06/20 0142 10/07/20 0233 10/08/20 0653 10/09/20 0245  WBC 5.7   < > 5.5 5.3 5.2 4.1 4.3  NEUTROABS 2.7  --   --   --   --   --   --   HGB 10.8*   < > 8.7* 9.8* 9.2* 9.5* 9.7*  HCT 35.8*   < > 28.7* 32.5* 30.7* 31.7* 32.0*  MCV 95.2   < > 95.3 95.0 96.8 95.8 95.2  PLT 250   < > 200 222 229 216 227   < > = values in this interval not displayed.    Cardiac Enzymes: No results for input(s): CKTOTAL, CKMB, CKMBINDEX, TROPONINI in the last 168 hours.  BNP: BNP (last 3 results)  Recent Labs    09/16/20 2120 10/03/20 1353 10/08/20 0852  BNP 11.4 97.9 42.4    ProBNP (last 3 results) No results for input(s): PROBNP in the last 8760 hours.   CBG: Recent Labs  Lab 10/03/20 1242 10/04/20 0617  GLUCAP 153* 98    Coagulation Studies: No results for input(s): LABPROT, INR in the last 72 hours.   Imaging   No results found.   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 -normal ECHO 07/2020 although did have signs of basilar ground glass and pleural effusions on CTA which preceded ECHO -ECHO 09/2020 with EF 25-30%, moderate concentric LVH, global hypokinesis, mildly reduced RV systolic function, and mild-moderate MR, dilated IVC with <50% respiratory variability -normal nuclear stress test 2019 -Last dose of Herceptin was  09/27/2020 -Admitted with NYHA class 3 symptoms, volume up on exam REDS 45%, IVC dilated -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 although she does have moderate concentric LVH and had uncontrolled HTN before first admission, could be multifactorial, will work up further with L/RHC on Thursday to rule out ischemic disease and assess hemodynamics -continue home entresto 24/26, spironolactone 12.5 will not escalate GDMT further due to low bp, did not receive entresto last night and morning bp 116/72, will put in hold parameters for entresto -still volume up continue IV lasix 60 BID, watch bicarb if this increases any further will need to add diamox -hold bb with exacerbation -may be able to add SGLT2i at some point  Metastatic Breast Cancer: -Last dose of Herceptin was 09/27/2020, will need to stop this -[facility]  T2DM: -A1C 6.4 on 5/20 -sglt2i eventually  Hypokalemia: -mag okay, replace K    Length of Stay: 1  Katherine Roan, MD  10/09/2020, 8:10 AM  Advanced Heart Failure Team Pager [phone] (M-F; 7a - 4p)  Please contact [facility] for night-coverage after hours (4p -7a ) and weekends on [email or web address]   Patient seen and examined with the above-signed Advanced Practice Provider and/or Housestaff. I personally reviewed laboratory data, imaging studies and relevant notes. I independently examined the patient and formulated the important aspects of the plan. I have edited the note to reflect any of my changes or salient points. I have personally discussed the plan with the patient and/or family.  She is diuresing well with IV lasix. Still mildly SOB. Renal function stable.    General:  Sitting up in bed. No resp difficulty HEENT: normal Neck: supple. JVP 8-9. Carotids 2+ bilat; no bruits. No lymphadenopathy or thryomegaly appreciated. Cor: PMI nondisplaced. Regular rate & rhythm. No rubs, gallops or  murmurs. Lungs: clear Abdomen: soft, nontender, nondistended. No hepatosplenomegaly. No bruits or masses. Good bowel sounds. Extremities: no cyanosis, clubbing, rash, tr edema Neuro: alert & orientedx3, cranial nerves grossly intact. moves all 4 extremities w/o difficulty. Affect pleasant  Continue IV diuresis. Plan R/L cath tomorrow am. Supp K. Increase spiro to 25

## 2020-10-09 NOTE — Progress Notes (Signed)
PROGRESS NOTE   Marie Church  DEY:[id number] DOB: April 23, 1956 DOA: 10/08/2020 PCP: System, Provider Not In  Brief Narrative:  65 year old community dwelling female Metastatic breast cancer/neoadjuvant chemo-i metastatic disease (follows Dr. Rowe [facility] [place]) 02/2020/Taxotere/carboplatin/Herceptin + Perjeta--echocardiogram 07/2020 EF 62% HTN DM TY 2  Recent admission 5/19-->5/22 with SOB, DOE, L LE edema--EF 20-25%--Herceptin DC'd.  DC home Entresto Coreg Lasix--plan was to discharge and follow-up with pulmonology given nocturnal desaturation  Return to emergency room 5/24-SOB, chest tightness Troponin 18 BNP 42 CXR = progressive bibasilar atelectasis Rx aspirin Lasix 60 IV twice daily Aldactone Entresto Beta-blocker held  REDS vest 45  Advanced heart failure team consulted Cath planned for 5/25  Hospital-Problem based course Acute decompensated systolic heart failure Cardiac cath planned 5/26 Current meds: Lasix 60 IV twice daily Entresto twice daily Aldactone 12.5-Coreg on hold secondary to hypotension on admission-, acute decompensated state Suspected dry weight: 190 pounds-currently 184 Further planning as per cardiology Nursing to give patient education regarding the same-patient from out of town and will need either TOC follow-up here prior to return to [place] versus setting up in the outpatient setting and home count Mild hypokalemia Contraction alkalosis 2/2 loop diuretic Given K-Dur 40 daily Repeat labs and mag in a.m. Watch bicarb given acute rise from 30-35 which may limit diuresis and/or may require Diamox ?  OSA We will get hs desat screen for qualification if necessary for CPAP DM TY 2 A1c 6.4 recently CBG 10 3-1 65 Initiate Amaryl low-dose tomorrow morning post cath Will need outpatient discussion regarding metformin or SGLT P inhibitor depending on affordability Metastatic breast cancer Outpatient follow-up [place] oncologist Dr.  Joretta Bachelor    DVT prophylaxis: Lovenox Code Status: Full Family Communication: None currently present Disposition:  Status is: Inpatient  Remains inpatient appropriate because:Hemodynamically unstable, Ongoing active pain requiring inpatient pain management and Altered mental status   Dispo: The patient is from: Home              Anticipated d/c is to: Home              Patient currently is not medically stable to d/c.   Difficult to place patient No       Consultants:   Advanced heart failure team  Procedures: Echocardiogram 5/24  Antimicrobials: None currently   Subjective: Awake alert feels well on oxygen no chest pain no fever Does not feel swollen Has walked around the room Eating drinking  Objective: Vitals:   10/08/20 2337 10/09/20 0506 10/09/20 0806 10/09/20 1216  BP: 107/62 116/72 111/62 123/82  Pulse: 81 88 89 89  Resp: 14 16 20 20   Temp: 98.2 F (36.8 C) 98.8 F (37.1 C) 98.3 F (36.8 C) 98.1 F (36.7 C)  TempSrc: Oral Oral Oral Oral  SpO2: 100% 100% 99% 94%  Weight:  84.6 kg    Height:        Intake/Output Summary (Last 24 hours) at 10/09/2020 1445 Last data filed at 10/09/2020 1217 Gross per 24 hour  Intake 480 ml  Output 3000 ml  Net -2520 ml   Filed Weights   10/08/20 2213 10/09/20 0506  Weight: 85.9 kg 84.6 kg    Examination:  EOMI NCAT no icterus no pallor No rales no rhonchi on chest exam S1-S2 no murmur normal sinus rhythm on monitors Abdomen soft no rebound No lower extremity edema  Data Reviewed: personally reviewed   CBC    Component Value Date/Time   WBC 4.3 10/09/2020 0245   RBC  3.36 (L) 10/09/2020 0245   HGB 9.7 (L) 10/09/2020 0245   HCT 32.0 (L) 10/09/2020 0245   PLT 227 10/09/2020 0245   MCV 95.2 10/09/2020 0245   MCH 28.9 10/09/2020 0245   MCHC 30.3 10/09/2020 0245   RDW 15.4 10/09/2020 0245   LYMPHSABS 2.3 10/03/2020 1353   MONOABS 0.4 10/03/2020 1353   EOSABS 0.1 10/03/2020 1353   BASOSABS 0.0  10/03/2020 1353   CMP Latest Ref Rng & Units 10/09/2020 10/08/2020 10/07/2020  Glucose 70 - 99 mg/dL 128(H) 103(H) 163(H)  BUN 8 - 23 mg/dL <5(L) <5(L) 8  Creatinine 0.44 - 1.00 mg/dL 0.63 0.63 0.83  Sodium 135 - 145 mmol/L 140 137 136  Potassium 3.5 - 5.1 mmol/L 3.3(L) 3.9 3.8  Chloride 98 - 111 mmol/L 95(L) 98 98  CO2 22 - 32 mmol/L 35(H) 30 33(H)  Calcium 8.9 - 10.3 mg/dL 8.9 8.8(L) 8.7(L)  Total Protein 6.5 - 8.1 g/dL - - -  Total Bilirubin 0.3 - 1.2 mg/dL - - -  Alkaline Phos 38 - 126 U/L - - -  AST 15 - 41 U/L - - -  ALT 0 - 44 U/L - - -     Radiology Studies: DG Chest 2 View  Result Date: 10/08/2020 CLINICAL DATA:  Chest pain.  History of breast cancer. EXAM: CHEST - 2 VIEW COMPARISON:  Chest x-ray 10/03/2020.  CT 10/03/2020. FINDINGS: PowerPort catheter with tip over SVC. Heart size normal. Progressive bibasilar atelectasis/infiltrates. No pleural effusion or pneumothorax. Degenerative change and scoliosis thoracic spine. IMPRESSION: 1.  PowerPort catheter with tip over SVC. 2.  Progressive bibasilar atelectasis/infiltrates. Electronically Signed   By: Marcello Moores  Register   On: 10/08/2020 07:14     Scheduled Meds: . aspirin  81 mg Oral Daily  . enoxaparin (LOVENOX) injection  40 mg Subcutaneous Daily  . furosemide  60 mg Intravenous BID  . potassium chloride  40 mEq Oral TID  . sacubitril-valsartan  1 tablet Oral BID  . sodium chloride flush  3 mL Intravenous Q12H  . spironolactone  12.5 mg Oral Daily   Continuous Infusions: . sodium chloride       LOS: 1 day   Time spent: Waller, MD [facility] To contact the attending provider between 7A-7P or the covering provider during after hours 7P-7A, please log into the web site [email or web address] and access using universal [place] password for that web site. If you do not have the password, please call the hospital operator.  10/09/2020, 2:45 PM

## 2020-10-09 NOTE — Progress Notes (Signed)
Report was given to Maudie Mercury the nurse at [facility] transporting patient.

## 2020-10-09 NOTE — H&P (View-Only) (Signed)
Advanced Heart Failure Team Consult Note   Primary Physician: unknown Primary Cardiologist:  Lyman Bishop  Reason for Consultation: acute systolic CHF  HPI:    Wt down 3lbs overnight, breathing mildly improved.  Denies chest pain, PND, reports good urine output overnight.  Oxygen bothering her nose.   Objective:    Vital Signs:   Temp:  [98.1 F (36.7 C)-98.8 F (37.1 C)] 98.3 F (36.8 C) (05/25 0806) Pulse Rate:  [73-94] 89 (05/25 0806) Resp:  [14-27] 20 (05/25 0806) BP: (95-134)/(62-82) 111/62 (05/25 0806) SpO2:  [82 %-100 %] 99 % (05/25 0806) Weight:  [84.6 kg-85.9 kg] 84.6 kg (05/25 0506) Last BM Date: 10/07/20  Weight change: Filed Weights   10/08/20 2213 10/09/20 0506  Weight: 85.9 kg 84.6 kg    Intake/Output:   Intake/Output Summary (Last 24 hours) at 10/09/2020 0810 Last data filed at 10/09/2020 0300 Gross per 24 hour  Intake 240 ml  Output 1500 ml  Net -1260 ml    Scheduled Meds: . aspirin  81 mg Oral Daily  . enoxaparin (LOVENOX) injection  40 mg Subcutaneous Daily  . furosemide  60 mg Intravenous BID  . potassium chloride  40 mEq Oral TID  . sacubitril-valsartan  1 tablet Oral BID  . sodium chloride flush  3 mL Intravenous Q12H  . spironolactone  12.5 mg Oral Daily   Continuous Infusions: . sodium chloride     PRN Meds:.sodium chloride, acetaminophen, ondansetron (ZOFRAN) IV, sodium chloride flush   Physical Exam    Cardiac: JVD 10, normal rate and rhythm, clear s1 and s2, no murmurs, rubs or gallops, 1-2+ LE edema Pulmonary: bibasilar rales, not in distress Abdominal: non distended abdomen, soft and nontender Psych: Alert, conversant, in good spirits   Telemetry   NSR 80's  EKG    No new ecg  Labs   Basic Metabolic Panel: Recent Labs  Lab 10/05/20 0108 10/06/20 0142 10/07/20 0233 10/08/20 0653 10/09/20 0245  NA 138 137 136 137 140  K 4.1 4.3 3.8 3.9 3.3*  CL 103 98 98 98 95*  CO2 29 30 33* 30 35*  GLUCOSE 103* 113*  163* 103* 128*  BUN 5* 9 8 <5* <5*  CREATININE 0.69 0.75 0.83 0.63 0.63  CALCIUM 8.9 8.8* 8.7* 8.8* 8.9  MG 1.6*  --   --   --  1.8  PHOS 4.9*  --   --   --   --     Liver Function Tests: No results for input(s): AST, ALT, ALKPHOS, BILITOT, PROT, ALBUMIN in the last 168 hours. No results for input(s): LIPASE, AMYLASE in the last 168 hours. No results for input(s): AMMONIA in the last 168 hours.  CBC: Recent Labs  Lab 10/03/20 1353 10/04/20 0150 10/05/20 0108 10/06/20 0142 10/07/20 0233 10/08/20 0653 10/09/20 0245  WBC 5.7   < > 5.5 5.3 5.2 4.1 4.3  NEUTROABS 2.7  --   --   --   --   --   --   HGB 10.8*   < > 8.7* 9.8* 9.2* 9.5* 9.7*  HCT 35.8*   < > 28.7* 32.5* 30.7* 31.7* 32.0*  MCV 95.2   < > 95.3 95.0 96.8 95.8 95.2  PLT 250   < > 200 222 229 216 227   < > = values in this interval not displayed.    Cardiac Enzymes: No results for input(s): CKTOTAL, CKMB, CKMBINDEX, TROPONINI in the last 168 hours.  BNP: BNP (last 3 results)  Recent Labs    09/16/20 2120 10/03/20 1353 10/08/20 0852  BNP 11.4 97.9 42.4    ProBNP (last 3 results) No results for input(s): PROBNP in the last 8760 hours.   CBG: Recent Labs  Lab 10/03/20 1242 10/04/20 0617  GLUCAP 153* 98    Coagulation Studies: No results for input(s): LABPROT, INR in the last 72 hours.   Imaging   No results found.   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 -normal ECHO 07/2020 although did have signs of basilar ground glass and pleural effusions on CTA which preceded ECHO -ECHO 09/2020 with EF 25-30%, moderate concentric LVH, global hypokinesis, mildly reduced RV systolic function, and mild-moderate MR, dilated IVC with <50% respiratory variability -normal nuclear stress test 2019 -Last dose of Herceptin was  09/27/2020 -Admitted with NYHA class 3 symptoms, volume up on exam REDS 45%, IVC dilated -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 although she does have moderate concentric LVH and had uncontrolled HTN before first admission, could be multifactorial, will work up further with L/RHC on Thursday to rule out ischemic disease and assess hemodynamics -continue home entresto 24/26, spironolactone 12.5 will not escalate GDMT further due to low bp, did not receive entresto last night and morning bp 116/72, will put in hold parameters for entresto -still volume up continue IV lasix 60 BID, watch bicarb if this increases any further will need to add diamox -hold bb with exacerbation -may be able to add SGLT2i at some point  Metastatic Breast Cancer: -Last dose of Herceptin was 09/27/2020, will need to stop this -[facility]  T2DM: -A1C 6.4 on 5/20 -sglt2i eventually  Hypokalemia: -mag okay, replace K    Length of Stay: 1  Katherine Roan, MD  10/09/2020, 8:10 AM  Advanced Heart Failure Team Pager [phone] (M-F; 7a - 4p)  Please contact [facility] for night-coverage after hours (4p -7a ) and weekends on [email or web address]   Patient seen and examined with the above-signed Advanced Practice Provider and/or Housestaff. I personally reviewed laboratory data, imaging studies and relevant notes. I independently examined the patient and formulated the important aspects of the plan. I have edited the note to reflect any of my changes or salient points. I have personally discussed the plan with the patient and/or family.  She is diuresing well with IV lasix. Still mildly SOB. Renal function stable.    General:  Sitting up in bed. No resp difficulty HEENT: normal Neck: supple. JVP 8-9. Carotids 2+ bilat; no bruits. No lymphadenopathy or thryomegaly appreciated. Cor: PMI nondisplaced. Regular rate & rhythm. No rubs, gallops or  murmurs. Lungs: clear Abdomen: soft, nontender, nondistended. No hepatosplenomegaly. No bruits or masses. Good bowel sounds. Extremities: no cyanosis, clubbing, rash, tr edema Neuro: alert & orientedx3, cranial nerves grossly intact. moves all 4 extremities w/o difficulty. Affect pleasant  Continue IV diuresis. Plan R/L cath tomorrow am. Supp K. Increase spiro to 25

## 2020-10-09 NOTE — Progress Notes (Signed)
Patient Daughter would like to be notified when patient is in procedure area [place] at [phone]

## 2020-10-10 ENCOUNTER — Encounter (HOSPITAL_COMMUNITY): Payer: Self-pay | Admitting: Internal Medicine

## 2020-10-10 ENCOUNTER — Other Ambulatory Visit (HOSPITAL_COMMUNITY): Payer: Self-pay

## 2020-10-10 ENCOUNTER — Encounter (HOSPITAL_COMMUNITY)
Admission: EM | Disposition: A | Discharge status: Home Health | Payer: Self-pay | Source: Home / Self Care | Attending: Family Medicine

## 2020-10-10 DIAGNOSIS — E119 Type 2 diabetes mellitus without complications: Secondary | ICD-10-CM

## 2020-10-10 HISTORY — PX: RIGHT/LEFT HEART CATH AND CORONARY ANGIOGRAPHY: CATH118266

## 2020-10-10 LAB — CBC
HCT: 34.5 % — ABNORMAL LOW (ref 36.0–46.0)
Hemoglobin: 10.2 g/dL — ABNORMAL LOW (ref 12.0–15.0)
MCH: 28.1 pg (ref 26.0–34.0)
MCHC: 29.6 g/dL — ABNORMAL LOW (ref 30.0–36.0)
MCV: 95 fL (ref 80.0–100.0)
Platelets: 259 10*3/uL (ref 150–400)
RBC: 3.63 MIL/uL — ABNORMAL LOW (ref 3.87–5.11)
RDW: 15.5 % (ref 11.5–15.5)
WBC: 4.6 10*3/uL (ref 4.0–10.5)
nRBC: 0 % (ref 0.0–0.2)

## 2020-10-10 LAB — CREATININE, SERUM
Creatinine, Ser: 0.79 mg/dL (ref 0.44–1.00)
GFR, Estimated: >60 mL/min (ref >60)

## 2020-10-10 LAB — MAGNESIUM: Magnesium: 2 mg/dL (ref 1.7–2.4)

## 2020-10-10 LAB — COMPREHENSIVE METABOLIC PANEL
ALT: 18 U/L (ref 0–44)
AST: 32 U/L (ref 15–41)
Albumin: 3.1 g/dL — ABNORMAL LOW (ref 3.5–5.0)
Alkaline Phosphatase: 57 U/L (ref 38–126)
Anion gap: 7 (ref 5–15)
BUN: 8 mg/dL (ref 8–23)
CO2: 35 mmol/L — ABNORMAL HIGH (ref 22–32)
Calcium: 9.1 mg/dL (ref 8.9–10.3)
Chloride: 96 mmol/L — ABNORMAL LOW (ref 98–111)
Creatinine, Ser: 0.82 mg/dL (ref 0.44–1.00)
GFR, Estimated: >60 mL/min (ref >60)
Glucose, Bld: 107 mg/dL — ABNORMAL HIGH (ref 70–99)
Potassium: 4.7 mmol/L (ref 3.5–5.1)
Sodium: 138 mmol/L (ref 135–145)
Total Bilirubin: 0.8 mg/dL (ref 0.3–1.2)
Total Protein: 6.6 g/dL (ref 6.5–8.1)

## 2020-10-10 LAB — POCT I-STAT 7, (LYTES, BLD GAS, ICA,H+H)
Acid-Base Excess: 9 mmol/L — ABNORMAL HIGH (ref 0.0–2.0)
Bicarbonate: 35.5 mmol/L — ABNORMAL HIGH (ref 20.0–28.0)
Calcium, Ion: 1.09 mmol/L — ABNORMAL LOW (ref 1.15–1.40)
HCT: 32 % — ABNORMAL LOW (ref 36.0–46.0)
Hemoglobin: 10.9 g/dL — ABNORMAL LOW (ref 12.0–15.0)
O2 Saturation: 99 %
Potassium: 4.2 mmol/L (ref 3.5–5.1)
Sodium: 143 mmol/L (ref 135–145)
TCO2: 37 mmol/L — ABNORMAL HIGH (ref 22–32)
pCO2 arterial: 61.4 mmHg — ABNORMAL HIGH (ref 32.0–48.0)
pH, Arterial: 7.371 (ref 7.350–7.450)
pO2, Arterial: 176 mmHg — ABNORMAL HIGH (ref 83.0–108.0)

## 2020-10-10 LAB — POCT I-STAT EG7
Acid-Base Excess: 10 mmol/L — ABNORMAL HIGH (ref 0.0–2.0)
Acid-Base Excess: 9 mmol/L — ABNORMAL HIGH (ref 0.0–2.0)
Acid-Base Excess: 9 mmol/L — ABNORMAL HIGH (ref 0.0–2.0)
Bicarbonate: 36 mmol/L — ABNORMAL HIGH (ref 20.0–28.0)
Bicarbonate: 37.3 mmol/L — ABNORMAL HIGH (ref 20.0–28.0)
Bicarbonate: 37.7 mmol/L — ABNORMAL HIGH (ref 20.0–28.0)
Calcium, Ion: 1.1 mmol/L — ABNORMAL LOW (ref 1.15–1.40)
Calcium, Ion: 1.2 mmol/L (ref 1.15–1.40)
Calcium, Ion: 1.22 mmol/L (ref 1.15–1.40)
HCT: 31 % — ABNORMAL LOW (ref 36.0–46.0)
HCT: 32 % — ABNORMAL LOW (ref 36.0–46.0)
HCT: 33 % — ABNORMAL LOW (ref 36.0–46.0)
Hemoglobin: 10.5 g/dL — ABNORMAL LOW (ref 12.0–15.0)
Hemoglobin: 10.9 g/dL — ABNORMAL LOW (ref 12.0–15.0)
Hemoglobin: 11.2 g/dL — ABNORMAL LOW (ref 12.0–15.0)
O2 Saturation: 74 %
O2 Saturation: 76 %
O2 Saturation: 83 %
Potassium: 4.3 mmol/L (ref 3.5–5.1)
Potassium: 4.5 mmol/L (ref 3.5–5.1)
Potassium: 4.6 mmol/L (ref 3.5–5.1)
Sodium: 140 mmol/L (ref 135–145)
Sodium: 141 mmol/L (ref 135–145)
Sodium: 143 mmol/L (ref 135–145)
TCO2: 38 mmol/L — ABNORMAL HIGH (ref 22–32)
TCO2: 39 mmol/L — ABNORMAL HIGH (ref 22–32)
TCO2: 40 mmol/L — ABNORMAL HIGH (ref 22–32)
pCO2, Ven: 65 mmHg — ABNORMAL HIGH (ref 44.0–60.0)
pCO2, Ven: 68.1 mmHg — ABNORMAL HIGH (ref 44.0–60.0)
pCO2, Ven: 68.3 mmHg — ABNORMAL HIGH (ref 44.0–60.0)
pH, Ven: 7.346 (ref 7.250–7.430)
pH, Ven: 7.35 (ref 7.250–7.430)
pH, Ven: 7.352 (ref 7.250–7.430)
pO2, Ven: 43 mmHg (ref 32.0–45.0)
pO2, Ven: 45 mmHg (ref 32.0–45.0)
pO2, Ven: 52 mmHg — ABNORMAL HIGH (ref 32.0–45.0)

## 2020-10-10 LAB — GLUCOSE, CAPILLARY: Glucose-Capillary: 127 mg/dL — ABNORMAL HIGH (ref 70–99)

## 2020-10-10 SURGERY — RIGHT/LEFT HEART CATH AND CORONARY ANGIOGRAPHY
Anesthesia: LOCAL

## 2020-10-10 MED ORDER — SPIRONOLACTONE 25 MG PO TABS
12.5000 mg | ORAL_TABLET | Freq: Every day | ORAL | 11 refills | Status: DC
  Filled 2020-10-10: qty 15, 30d supply, fill #0

## 2020-10-10 MED ORDER — SODIUM CHLORIDE 0.9 % IV SOLN: 250.0000 mL | INTRAVENOUS | Status: DC | PRN

## 2020-10-10 MED ORDER — HEPARIN (PORCINE) IN NACL 1000-0.9 UT/500ML-% IV SOLN
INTRAVENOUS | Status: DC | PRN
  Administered 2020-10-10 (×2): 500 mL

## 2020-10-10 MED ORDER — VERAPAMIL HCL 2.5 MG/ML IV SOLN
INTRAVENOUS | Status: AC
  Filled 2020-10-10: qty 2

## 2020-10-10 MED ORDER — DAPAGLIFLOZIN PROPANEDIOL 10 MG PO TABS
10.0000 mg | ORAL_TABLET | Freq: Every day | ORAL | 0 refills | Status: DC
  Filled 2020-10-10: qty 30, 30d supply, fill #0

## 2020-10-10 MED ORDER — SODIUM CHLORIDE 0.9 % IV SOLN: INTRAVENOUS | Status: AC

## 2020-10-10 MED ORDER — LIDOCAINE HCL (PF) 1 % IJ SOLN
INTRAMUSCULAR | Status: AC
  Filled 2020-10-10: qty 30

## 2020-10-10 MED ORDER — LIDOCAINE HCL (PF) 1 % IJ SOLN
INTRAMUSCULAR | Status: DC | PRN
  Administered 2020-10-10: 2 mL
  Administered 2020-10-10: 15 mL

## 2020-10-10 MED ORDER — LABETALOL HCL 5 MG/ML IV SOLN: 10.0000 mg | INTRAVENOUS | Status: DC | PRN

## 2020-10-10 MED ORDER — MIDAZOLAM HCL 2 MG/2ML IJ SOLN
INTRAMUSCULAR | Status: DC | PRN
  Administered 2020-10-10: 1 mg via INTRAVENOUS

## 2020-10-10 MED ORDER — HEPARIN SODIUM (PORCINE) 1000 UNIT/ML IJ SOLN
INTRAMUSCULAR | Status: AC
  Filled 2020-10-10: qty 1

## 2020-10-10 MED ORDER — ONDANSETRON HCL 4 MG/2ML IJ SOLN: 4.0000 mg | Freq: Four times a day (QID) | INTRAMUSCULAR | Status: DC | PRN

## 2020-10-10 MED ORDER — VERAPAMIL HCL 2.5 MG/ML IV SOLN: INTRAVENOUS | Status: DC | PRN

## 2020-10-10 MED ORDER — HEPARIN (PORCINE) IN NACL 1000-0.9 UT/500ML-% IV SOLN
INTRAVENOUS | Status: AC
  Filled 2020-10-10: qty 1000

## 2020-10-10 MED ORDER — SODIUM CHLORIDE 0.9% FLUSH: 3.0000 mL | INTRAVENOUS | Status: DC | PRN

## 2020-10-10 MED ORDER — HYDRALAZINE HCL 20 MG/ML IJ SOLN: 10.0000 mg | INTRAMUSCULAR | Status: DC | PRN

## 2020-10-10 MED ORDER — FENTANYL CITRATE (PF) 100 MCG/2ML IJ SOLN
INTRAMUSCULAR | Status: AC
  Filled 2020-10-10: qty 2

## 2020-10-10 MED ORDER — FENTANYL CITRATE (PF) 100 MCG/2ML IJ SOLN
INTRAMUSCULAR | Status: DC | PRN
  Administered 2020-10-10: 25 ug via INTRAVENOUS

## 2020-10-10 MED ORDER — ACETAMINOPHEN 325 MG PO TABS
650.0000 mg | ORAL_TABLET | ORAL | Status: DC | PRN
  Administered 2020-10-10: 650 mg via ORAL
  Filled 2020-10-10: qty 2

## 2020-10-10 MED ORDER — HEPARIN SODIUM (PORCINE) 1000 UNIT/ML IJ SOLN
INTRAMUSCULAR | Status: DC | PRN
  Administered 2020-10-10: 4000 [IU] via INTRAVENOUS

## 2020-10-10 MED ORDER — SPIRONOLACTONE 25 MG PO TABS: 12.5000 mg | ORAL_TABLET | Freq: Every day | ORAL | 11 refills | Status: DC

## 2020-10-10 MED ORDER — SODIUM CHLORIDE 0.9% FLUSH
3.0000 mL | Freq: Two times a day (BID) | INTRAVENOUS | Status: DC
  Administered 2020-10-10: 3 mL via INTRAVENOUS

## 2020-10-10 MED ORDER — SACUBITRIL-VALSARTAN 24-26 MG PO TABS
1.0000 | ORAL_TABLET | Freq: Two times a day (BID) | ORAL | 2 refills | Status: DC
  Filled 2020-10-10: qty 60, 30d supply, fill #0

## 2020-10-10 MED ORDER — ENOXAPARIN SODIUM 40 MG/0.4ML IJ SOSY: 40.0000 mg | PREFILLED_SYRINGE | INTRAMUSCULAR | Status: DC

## 2020-10-10 MED ORDER — MIDAZOLAM HCL 2 MG/2ML IJ SOLN
INTRAMUSCULAR | Status: AC
  Filled 2020-10-10: qty 2

## 2020-10-10 MED ORDER — FUROSEMIDE 40 MG PO TABS
40.0000 mg | ORAL_TABLET | Freq: Every day | ORAL | 2 refills | Status: DC
  Filled 2020-10-10: qty 30, 30d supply, fill #0

## 2020-10-10 MED ORDER — DAPAGLIFLOZIN PROPANEDIOL 10 MG PO TABS: 10.0000 mg | ORAL_TABLET | Freq: Every day | ORAL | 0 refills | Status: DC

## 2020-10-10 MED ORDER — IOHEXOL 350 MG/ML SOLN
INTRAVENOUS | Status: DC | PRN
Start: 2020-10-10 — End: 2020-10-10
  Administered 2020-10-10: 40 mL

## 2020-10-10 SURGICAL SUPPLY — 12 items
CATH 5FR JL3.5 JR4 ANG PIG MP (CATHETERS) ×1 IMPLANT
CATH SWAN GANZ 7F STRAIGHT (CATHETERS) ×1 IMPLANT
DEVICE RAD COMP TR BAND LRG (VASCULAR PRODUCTS) ×1 IMPLANT
GLIDESHEATH SLEND SS 6F .021 (SHEATH) ×1 IMPLANT
GUIDEWIRE INQWIRE 1.5J.035X260 (WIRE) IMPLANT
INQWIRE 1.5J .035X260CM (WIRE) ×2
KIT HEART LEFT (KITS) ×1 IMPLANT
PACK CARDIAC CATHETERIZATION (CUSTOM PROCEDURE TRAY) ×2 IMPLANT
SHEATH PINNACLE 7F 10CM (SHEATH) ×1 IMPLANT
SHEATH PROBE COVER 6X72 (BAG) ×1 IMPLANT
TRANSDUCER W/STOPCOCK (MISCELLANEOUS) ×2 IMPLANT
TUBING CIL FLEX 10 FLL-RA (TUBING) ×1 IMPLANT

## 2020-10-10 NOTE — TOC Transition Note (Signed)
Transition of Care [facility]) - CM/SW Discharge Note Heart Failure   Patient Details  Name: Marie Church MRN: [id number] Date of Birth: 1955/08/16  Transition of Care [facility] CM/SW Contact:  Preston, McEwen Phone Number: 10/10/2020, 11:56 AM   Clinical Narrative:    CSW spoke with the patient at bedside and confirmed the patient has transportation home and brought her an appointment card for the [facility] outpatient clinic and encouraged her to follow up and to attend the appointment and bring their medications and if anything changes to please reach out so that CSW/HV clinic team can provide support.   CSW will sign off for now as social work intervention is no longer needed. Please consult [place] again if new needs arise.       Patient Goals and CMS Choice        Discharge Placement                       Discharge Plan and Services                                     Social Determinants of Health (SDOH) Interventions     Readmission Risk Interventions Readmission Risk Prevention Plan 10/07/2020  Transportation Screening Complete  PCP or Specialist Appt within 5-7 Days Complete  Home Care Screening Complete  Medication Review (RN CM) Complete    Armanie Ullmer, MSW, [place] Heart Failure Social Worker

## 2020-10-10 NOTE — Discharge Summary (Signed)
Physician Discharge Summary  Marie Church ENI:[id number] DOB: January 31, 1956 DOA: 10/08/2020  PCP: System, Provider Not In  Admit date: 10/08/2020 Discharge date: 10/10/2020  Time spent: 26 minutes  Recommendations for Outpatient Follow-up:  1. New medications = Lasix, spironolactone, Wilder Glade, Entresto-Case management consulted to help with affordability 2. Will need close follow-up with advanced heart failure team-CC Dr. Haroldine Laws 3. Dry weight on discharge 83 kg (previously 87)  Discharge Diagnoses:  MAIN problem for hospitalization   Systolic heart failure new onset acute exacerbation  Please see below for itemized issues addressed in [place]- refer to other progress notes for clarity if needed  Discharge Condition: Improved  Diet recommendation: Heart healthy  Filed Weights   10/08/20 2213 10/09/20 0506 10/10/20 0417  Weight: 85.9 kg 84.6 kg 83.6 kg    History of present illness:  65 year old community dwelling female Metastatic breast cancer/neoadjuvant chemo-i metastatic disease (follows Dr. Rowe [facility] [place]) 02/2020/Taxotere/carboplatin/Herceptin + Perjeta--echocardiogram 07/2020 EF 62% HTN DM TY 2  Recent admission 5/19-->5/22 with SOB, DOE, L LE edema--EF 20-25%--Herceptin DC'd.  DC home Entresto Coreg Lasix--plan was to discharge and follow-up with pulmonology given nocturnal desaturation  Return to emergency room 5/24-SOB, chest tightness Troponin 18 BNP 42 CXR = progressive bibasilar atelectasis Rx aspirin Lasix 60 IV twice daily Aldactone Entresto Beta-blocker held  REDS vest 45  Advanced heart failure team consulted Cath performed 5/26 as below  Dropped weight from around 87 kg to about 83 on discharge and meds were adjusted by advanced heart failure team  Patient was diuresed appropriately dropped weight and hypokalemia was addressed New medications were added as above  Labs will be needed in about 1 week's time at PCP office and or  at cardiology office as several new medications including Entresto and Aldactone were started  I discussed in detail with her daughter at bedside who confirms patient will be staying in [place] and that they will be trying to transfer her care to this area from [facility]    Procedures: Assessment: 1. Normal cors 2. 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 Relatively well compensated hemodynamics with high cardiac output   Plan/Discussion:   Stop IV lasix. Switch to po. Continue to titrate GDMT. Likely can d/c soon.   Consultations:  Heart failure team  Discharge Exam: Vitals:   10/10/20 0955 10/10/20 0956  BP: (!) 139/99 (!) 138/96  Pulse: 92 91  Resp: 17   Temp: 98.5 F (36.9 C)   SpO2: 97% 94%    Subj on day of d/d  awake alert just back from cardiac cath-looks like they used radial and femoral-she feels comfortable she is not on oxygen and she is asking to eat    General Exam on discharge  Coherent pleasant no distress EOMI NCAT CTA B no rales rhonchi Band on right upper extremity No lower extremity edema Abdomen soft no rebound  Discharge Instructions   Discharge Instructions    Diet - low sodium heart healthy   Complete by: As directed    Discharge instructions   Complete by: As directed    Please check your weight daily and if you gain more than 2 pounds over 24-hour period of time you will need to probably take another dose of Lasix Please look at your meds carefully you have been started on new medications called Entresto, Aldactone, Farxiga and will need close follow-up with the advanced heart failure team in the outpatient setting and they should be contacting you for an appointment within 2 weeks Please consider transferring your  oncology care from [facility] to [place] as we think that one of your chemotherapy medications may have caused you to have heart issues You will need strict dietary control as least salt as possible and restrict her fluid to  1200 1500 cc a day Please contact your primary physician and/or cardiologist if there are concerns if you are short of breath or if you gain fluid and cannot get rid of it   Increase activity slowly   Complete by: As directed      Allergies as of 10/10/2020      Reactions   Doxycycline Rash   Amoxicillin-pot Clavulanate Rash   Lisinopril Rash      Medication List    STOP taking these medications   carvedilol 3.125 MG tablet Commonly known as: COREG     TAKE these medications   Aspirin Low Dose 81 MG chewable tablet Generic drug: aspirin Chew 1 tablet (81 mg total) by mouth daily.   dapagliflozin propanediol 10 MG Tabs tablet Commonly known as: Farxiga Take 1 tablet (10 mg total) by mouth daily before breakfast.   furosemide 40 MG tablet Commonly known as: LASIX Take 1 tablet (40 mg total) by mouth daily.   sacubitril-valsartan 24-26 MG Commonly known as: ENTRESTO Take 1 tablet by mouth 2 (two) times daily.   spironolactone 25 MG tablet Commonly known as: Aldactone Take 0.5 tablets (12.5 mg total) by mouth daily.      Allergies  Allergen Reactions  . Doxycycline Rash  . Amoxicillin-Pot Clavulanate Rash  . Lisinopril Rash    Follow-up Information    [facility] Follow up on 10/16/2020.   Specialty: Cardiology Why: Please follow up in the Advanced Heart Failure clinic on 10/16/2020 at 9:20am [place] Contact information: [address] [id number] [place] [phone]               The results of significant diagnostics from this hospitalization (including imaging, microbiology, ancillary and laboratory) are listed below for reference.    Significant Diagnostic Studies: DG Chest 2 View  Result Date: 10/08/2020 CLINICAL DATA:  Chest pain.  History of breast cancer. EXAM: CHEST - 2 VIEW COMPARISON:  Chest x-ray 10/03/2020.  CT 10/03/2020. FINDINGS: PowerPort catheter with  tip over SVC. Heart size normal. Progressive bibasilar atelectasis/infiltrates. No pleural effusion or pneumothorax. Degenerative change and scoliosis thoracic spine. IMPRESSION: 1.  PowerPort catheter with tip over SVC. 2.  Progressive bibasilar atelectasis/infiltrates. Electronically Signed   By: Marcello Moores  Register   On: 10/08/2020 07:14   DG Chest 2 View  Result Date: 10/03/2020 CLINICAL DATA:  Shortness of breath, hypertension EXAM: CHEST - 2 VIEW COMPARISON:  09/16/2020 FINDINGS: The heart size and mediastinal contours are within normal limits. Left chest port catheter. Mild, diffuse interstitial pulmonary opacity, similar to prior examination. The visualized skeletal structures are unremarkable. IMPRESSION: Mild, diffuse interstitial pulmonary opacity, similar to prior examination and may reflect mild edema and/or chronic interstitial change. No new or focal airspace opacity. Electronically Signed   By: Eddie Candle M.D.   On: 10/03/2020 12:13   DG Chest 2 View  Result Date: 09/16/2020 CLINICAL DATA:  Dyspnea, lightheaded, hypertension EXAM: CHEST - 2 VIEW COMPARISON:  None. FINDINGS: Left internal jugular Port-A-Cath terminates in the lower third of the SVC. Normal heart size. Normal mediastinal contour. No pneumothorax. No pleural effusion. Platelike mild scarring versus atelectasis at the anterior lung bases on the lateral view. No acute  consolidative airspace disease. No pulmonary edema. IMPRESSION: Platelike mild scarring versus atelectasis at the anterior lung bases on the lateral view. Otherwise no active cardiopulmonary disease. Electronically Signed   By: Ilona Sorrel M.D.   On: 09/16/2020 21:48   CT Angio Chest PE W and/or Wo Contrast  Result Date: 10/03/2020 CLINICAL DATA:  Positive D-dimer, shortness of breath EXAM: CT ANGIOGRAPHY CHEST WITH CONTRAST TECHNIQUE: Multidetector CT imaging of the chest was performed using the standard protocol during bolus administration of intravenous  contrast. Multiplanar CT image reconstructions and MIPs were obtained to evaluate the vascular anatomy. CONTRAST:  44mL OMNIPAQUE IOHEXOL 350 MG/ML SOLN COMPARISON:  09/17/2020 FINDINGS: Cardiovascular:  Left port catheter tubing mid SVC level. Tortuosity of the major branch vessels. Negative for aneurysm or dissection. No mediastinal hemorrhage or hematoma. Pulmonary arteries appear patent. No significant filling defect or pulmonary embolus by CTA. Normal heart size.  No pericardial effusion. Mediastinum/Nodes: Stable bilateral axillary adenopathy. No significant mediastinal hilar adenopathy. Thyroid unremarkable. Trachea central airways are patent. Esophagus nondilated. No hiatal hernia. Lungs/Pleura: Anterior right middle lobe and lingula scarring. Low lung volumes with hypoventilatory changes noted. Minor basilar atelectasis. No acute airspace process, pneumonia or consolidation. No interstitial process or edema. No pleural abnormality, effusion or pneumothorax. Upper Abdomen: No acute abnormality. Musculoskeletal: No acute osseous finding or compression fracture. Sternum intact. Review of the MIP images confirms the above findings. IMPRESSION: Negative for significant acute pulmonary embolus by CTA. Overall stable exam. Low lung volumes with areas of parenchymal scarring and atelectasis. No acute airspace process Similar bilateral axillary adenopathy. Electronically Signed   By: Jerilynn Mages.  Shick M.D.   On: 10/03/2020 16:22   CT Angio Chest PE W and/or Wo Contrast  Result Date: 09/17/2020 CLINICAL DATA:  Hypertension and shortness of breath. EXAM: CT ANGIOGRAPHY CHEST WITH CONTRAST TECHNIQUE: Multidetector CT imaging of the chest was performed using the standard protocol during bolus administration of intravenous contrast. Multiplanar CT image reconstructions and MIPs were obtained to evaluate the vascular anatomy. CONTRAST:  149mL OMNIPAQUE IOHEXOL 350 MG/ML SOLN COMPARISON:  None. FINDINGS: Cardiovascular: A  left-sided venous Port-A-Cath is seen. The thoracic aorta is normal in caliber without evidence of aneurysmal dilatation. Satisfactory opacification of the pulmonary arteries to the segmental level. No evidence of pulmonary embolism. The left pulmonary artery is prominent and measures 2.6 cm in diameter. Normal heart size. No pericardial effusion. Mediastinum/Nodes: No enlarged mediastinal, hilar, or axillary lymph nodes. Thyroid gland, trachea, and esophagus demonstrate no significant findings. Lungs/Pleura: Mild areas of atelectasis are seen within the right upper lobe and left lung base. There is no evidence of a pleural effusion or pneumothorax. Upper Abdomen: No acute abnormality. Musculoskeletal: No chest wall abnormality. No acute or significant osseous findings. Review of the MIP images confirms the above findings. IMPRESSION: 1. No evidence of pulmonary embolism. 2. Mild right upper lobe and left basilar atelectasis. Electronically Signed   By: Virgina Norfolk M.D.   On: 09/17/2020 00:58   CARDIAC CATHETERIZATION  Result Date: 10/10/2020 Findings: Ao = 110/67 (87) LV = 115/19 RA = 5 RV = 43/8 PA = 43/18 (27) PCW = 12 Fick cardiac output/index = 8.2/4.2 PVR = 1.8 WU FA sat = 99% PA sat = 73%, 75% Assessment: 1. Normal cors 2. 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 Relatively well compensated hemodynamics with high cardiac output Plan/Discussion: Stop IV lasix. Switch to po. Continue to titrate GDMT. Likely can d/c soon. Glori Bickers, MD 9:27 AM  ECHOCARDIOGRAM COMPLETE  Result Date: 10/04/2020  ECHOCARDIOGRAM REPORT   Patient Name:   CALIOPE RUPPERT Date of Exam: 10/04/2020 Medical Rec #:  [id number]        Height:       67.0 in Accession #:    [id number]       Weight:       198.2 lb Date of Birth:  December 22, 1955        BSA:          2.014 m Patient Age:    23 years         BP:           172/119 mmHg Patient Gender: F                HR:           104 bpm. Exam Location:  Inpatient Procedure: 2D Echo, 3D Echo, Cardiac  Doppler, Color Doppler and Strain Analysis Indications:    R06.02 SOB  History:        Patient has no prior history of Echocardiogram examinations.                 Signs/Symptoms:Dizziness/Lightheadedness, Shortness of Breath                 and Dyspnea; Risk Factors:Diabetes and Hypertension. Breast                 cancer. Port. Hypoxia.  Sonographer:    Roseanna Rainbow RDCS Referring Phys: [id number] Nicolette Bang  Sonographer Comments: Patient is morbidly obese. Image acquisition challenging due to patient body habitus. Global longitudinal strain was attempted. IMPRESSIONS  1. Left ventricular ejection fraction, by estimation, is 25 to 30%. Left ventricular ejection fraction by 3D volume is 28 %. The left ventricle has severely decreased function. The left ventricle demonstrates global hypokinesis. There is moderate concentric left ventricular hypertrophy. Indeterminate diastolic filling due to E-A fusion. The average left ventricular global longitudinal strain is -10.3 %. The global longitudinal strain is abnormal.  2. Right ventricular systolic function is mildly reduced. The right ventricular size is normal.  3. The mitral valve is normal in structure. There is mild-to-moderate posterolaterally directed mitral regurgitation based on quantitative assessment with EROA 0.15; RVol 27. Based on visual assesment, appears more moderate. Mechanism appears functional.  4. The aortic valve is tricuspid. There is mild calcification of the aortic valve. There is mild thickening of the aortic valve. Aortic valve regurgitation is not visualized. Mild aortic valve sclerosis is present, with no evidence of aortic valve stenosis.  5. The inferior vena cava is dilated in size with <50% respiratory variability, suggesting right atrial pressure of 15 mmHg. Comparison(s): No prior Echocardiogram. FINDINGS  Left Ventricle: No LV trhomus visualized. Left ventricular ejection fraction, by estimation, is 25 to 30%. Left ventricular  ejection fraction by 3D volume is 28 %. The left ventricle has severely decreased function. The left ventricle demonstrates global hypokinesis. The average left ventricular global longitudinal strain is -10.3 %. The global longitudinal strain is abnormal. The left ventricular internal cavity size was normal in size. There is moderate concentric left ventricular hypertrophy. Indeterminate diastolic filling due to E-A fusion. Right Ventricle: The right ventricular size is normal. No increase in right ventricular wall thickness. Right ventricular systolic function is mildly reduced. Left Atrium: Left atrial size was normal in size. Right Atrium: Right atrial size was normal in size. Pericardium: There is no evidence of pericardial effusion. Mitral Valve: The mitral valve is normal in structure. There is mild  thickening of the mitral valve leaflet(s). There is mild calcification of the mitral valve leaflet(s). Mild mitral annular calcification. There is mild-to-moderate mitral regurgitation based on quantitative assessment with EROA 0.15; RVol 27. Based on visual assesment, appears more moderate. mitral valve regurgitation. Tricuspid Valve: The tricuspid valve is normal in structure. Tricuspid valve regurgitation is trivial. Aortic Valve: The aortic valve is tricuspid. There is mild calcification of the aortic valve. There is mild thickening of the aortic valve. Aortic valve regurgitation is not visualized. Mild aortic valve sclerosis is present, with no evidence of aortic valve stenosis. Pulmonic Valve: The pulmonic valve was normal in structure. Pulmonic valve regurgitation is trivial. Aorta: The aortic root and ascending aorta are structurally normal, with no evidence of dilitation. Venous: The inferior vena cava is dilated in size with less than 50% respiratory variability, suggesting right atrial pressure of 15 mmHg. IAS/Shunts: No atrial level shunt detected by color flow Doppler.  LEFT VENTRICLE PLAX 2D LVIDd:          4.10 cm         Diastology LVIDs:         3.60 cm         LV e' medial:    6.85 cm/s LV PW:         1.80 cm         LV E/e' medial:  7.6 LV IVS:        1.40 cm         LV e' lateral:   6.42 cm/s LVOT diam:     1.90 cm         LV E/e' lateral: 8.1 LV SV:         49 LV SV Index:   24              2D LVOT Area:     2.84 cm        Longitudinal                                Strain                                2D Strain GLS  -10.3 % LV Volumes (MOD)               Avg: LV vol d, MOD    98.7 ml A2C:                           3D Volume EF LV vol d, MOD    80.4 ml       LV 3D EF:    Left A4C:                                        ventricular LV vol s, MOD    71.3 ml                    ejection A2C:                                        fraction by LV vol s, MOD    56.6 ml  3D volume A4C:                                        is 28 %. LV SV MOD A2C:   27.4 ml LV SV MOD A4C:   80.4 ml LV SV MOD BP:    24.2 ml       3D Volume EF:                                3D EF:        28 % RIGHT VENTRICLE            IVC RV S prime:     8.59 cm/s  IVC diam: 2.50 cm TAPSE (M-mode): 1.3 cm LEFT ATRIUM             Index       RIGHT ATRIUM          Index LA diam:        3.80 cm 1.89 cm/m  RA Area:     8.20 cm LA Vol (A2C):   44.3 ml 21.99 ml/m RA Volume:   13.40 ml 6.65 ml/m LA Vol (A4C):   38.2 ml 18.96 ml/m LA Biplane Vol: 42.2 ml 20.95 ml/m  AORTIC VALVE             PULMONIC VALVE LVOT Vmax:   113.00 cm/s PR End Diast Vel: 1.95 msec LVOT Vmean:  74.900 cm/s LVOT VTI:    0.173 m  AORTA Ao Root diam: 2.70 cm Ao Asc diam:  3.20 cm MITRAL VALVE MV Area (PHT): 6.96 cm      SHUNTS MV Decel Time: 109 msec      Systemic VTI:  0.17 m MR Peak grad:    142.3 mmHg  Systemic Diam: 1.90 cm MR Mean grad:    81.0 mmHg MR Vmax:         596.50 cm/s MR Vmean:        412.5 cm/s MR PISA:         2.26 cm MR PISA Eff ROA: 15 mm MR PISA Radius:  0.60 cm MV E velocity: 52.21 cm/s MV A velocity: 127.00 cm/s MV E/A ratio:  0.41  Gwyndolyn Kaufman MD Electronically signed by Gwyndolyn Kaufman MD Signature Date/Time: 10/04/2020/11:15:05 AM    Final     Microbiology: Recent Results (from the past 240 hour(s))  Resp Panel by RT-PCR (Flu A&B, Covid) Nasopharyngeal Swab     Status: None   Collection Time: 10/03/20  6:28 PM   Specimen: Nasopharyngeal Swab; Nasopharyngeal(NP) swabs in vial transport medium  Result Value Ref Range Status   SARS Coronavirus 2 by RT PCR NEGATIVE NEGATIVE Final    Comment: (NOTE) SARS-CoV-2 target nucleic acids are NOT DETECTED.  The SARS-CoV-2 RNA is generally detectable in upper respiratory specimens during the acute phase of infection. The lowest concentration of SARS-CoV-2 viral copies this assay can detect is 138 copies/mL. A negative result does not preclude SARS-Cov-2 infection and should not be used as the sole basis for treatment or other patient management decisions. A negative result may occur with  improper specimen collection/handling, submission of specimen other than nasopharyngeal swab, presence of viral mutation(s) within the areas targeted by this assay, and inadequate number of viral copies(<138 copies/mL). A negative result must be combined with clinical observations, patient  history, and epidemiological information. The expected result is Negative.  Fact Sheet for Patients:  EntrepreneurPulse.com.au  Fact Sheet for Healthcare Providers:  IncredibleEmployment.be  This test is no t yet approved or cleared by the [place] FDA and  has been authorized for detection and/or diagnosis of SARS-CoV-2 by FDA under an Emergency Use Authorization (EUA). This EUA will remain  in effect (meaning this test can be used) for the duration of the COVID-19 declaration under Section 564(b)(1) of the Act, 21 U.S.C.section 360bbb-3(b)(1), unless the authorization is terminated  or revoked sooner.       Influenza A by PCR NEGATIVE NEGATIVE  Final   Influenza B by PCR NEGATIVE NEGATIVE Final    Comment: (NOTE) The Xpert Xpress SARS-CoV-2/FLU/RSV plus assay is intended as an aid in the diagnosis of influenza from Nasopharyngeal swab specimens and should not be used as a sole basis for treatment. Nasal washings and aspirates are unacceptable for Xpert Xpress SARS-CoV-2/FLU/RSV testing.  Fact Sheet for Patients: EntrepreneurPulse.com.au  Fact Sheet for Healthcare Providers: IncredibleEmployment.be  This test is not yet approved or cleared by the [place] FDA and has been authorized for detection and/or diagnosis of SARS-CoV-2 by FDA under an Emergency Use Authorization (EUA). This EUA will remain in effect (meaning this test can be used) for the duration of the COVID-19 declaration under Section 564(b)(1) of the Act, 21 U.S.C. section 360bbb-3(b)(1), unless the authorization is terminated or revoked.  Performed at [facility], [place] [address], [place], [place] [address]   Resp Panel by RT-PCR (Flu A&B, Covid) Nasopharyngeal Swab     Status: None   Collection Time: 10/08/20  6:24 PM   Specimen: Nasopharyngeal Swab; Nasopharyngeal(NP) swabs in vial transport medium  Result Value Ref Range Status   SARS Coronavirus 2 by RT PCR NEGATIVE NEGATIVE Final    Comment: (NOTE) SARS-CoV-2 target nucleic acids are NOT DETECTED.  The SARS-CoV-2 RNA is generally detectable in upper respiratory specimens during the acute phase of infection. The lowest concentration of SARS-CoV-2 viral copies this assay can detect is 138 copies/mL. A negative result does not preclude SARS-Cov-2 infection and should not be used as the sole basis for treatment or other patient management decisions. A negative result may occur with  improper specimen collection/handling, submission of specimen other than nasopharyngeal swab, presence of viral mutation(s) within the areas targeted by this assay, and  inadequate number of viral copies(<138 copies/mL). A negative result must be combined with clinical observations, patient history, and epidemiological information. The expected result is Negative.  Fact Sheet for Patients:  EntrepreneurPulse.com.au  Fact Sheet for Healthcare Providers:  IncredibleEmployment.be  This test is no t yet approved or cleared by the [place] FDA and  has been authorized for detection and/or diagnosis of SARS-CoV-2 by FDA under an Emergency Use Authorization (EUA). This EUA will remain  in effect (meaning this test can be used) for the duration of the COVID-19 declaration under Section 564(b)(1) of the Act, 21 U.S.C.section 360bbb-3(b)(1), unless the authorization is terminated  or revoked sooner.       Influenza A by PCR NEGATIVE NEGATIVE Final   Influenza B by PCR NEGATIVE NEGATIVE Final    Comment: (NOTE) The Xpert Xpress SARS-CoV-2/FLU/RSV plus assay is intended as an aid in the diagnosis of influenza from Nasopharyngeal swab specimens and should not be used as a sole basis for treatment. Nasal washings and aspirates are unacceptable for Xpert Xpress SARS-CoV-2/FLU/RSV testing.  Fact Sheet for Patients: EntrepreneurPulse.com.au  Fact  Sheet for Healthcare Providers: IncredibleEmployment.be  This test is not yet approved or cleared by the [place] and has been authorized for detection and/or diagnosis of SARS-CoV-2 by FDA under an Emergency Use Authorization (EUA). This EUA will remain in effect (meaning this test can be used) for the duration of the COVID-19 declaration under Section 564(b)(1) of the Act, 21 U.S.C. section 360bbb-3(b)(1), unless the authorization is terminated or revoked.  Performed at [facility], [place] [address] [place], [place] [address]      Labs: Basic Metabolic Panel: Recent Labs  Lab 10/05/20 0108 10/06/20 0142  10/07/20 0233 10/08/20 0093 10/09/20 0245 10/10/20 0307 10/10/20 0919  NA 138 137 136 137 140 138  --   K 4.1 4.3 3.8 3.9 3.3* 4.7  --   CL 103 98 98 98 95* 96*  --   CO2 29 30 33* 30 35* 35*  --   GLUCOSE 103* 113* 163* 103* 128* 107*  --   BUN 5* 9 8 <5* <5* 8  --   CREATININE 0.69 0.75 0.83 0.63 0.63 0.82 0.79  CALCIUM 8.9 8.8* 8.7* 8.8* 8.9 9.1  --   MG 1.6*  --   --   --  1.8 2.0  --   PHOS 4.9*  --   --   --   --   --   --    Liver Function Tests: Recent Labs  Lab 10/10/20 0307  AST 32  ALT 18  ALKPHOS 57  BILITOT 0.8  PROT 6.6  ALBUMIN 3.1*   No results for input(s): LIPASE, AMYLASE in the last 168 hours. No results for input(s): AMMONIA in the last 168 hours. CBC: Recent Labs  Lab 10/03/20 1353 10/04/20 0150 10/06/20 0142 10/07/20 0233 10/08/20 0653 10/09/20 0245 10/10/20 0919  WBC 5.7   < > 5.3 5.2 4.1 4.3 4.6  NEUTROABS 2.7  --   --   --   --   --   --   HGB 10.8*   < > 9.8* 9.2* 9.5* 9.7* 10.2*  HCT 35.8*   < > 32.5* 30.7* 31.7* 32.0* 34.5*  MCV 95.2   < > 95.0 96.8 95.8 95.2 95.0  PLT 250   < > 222 229 216 227 259   < > = values in this interval not displayed.   Cardiac Enzymes: No results for input(s): CKTOTAL, CKMB, CKMBINDEX, TROPONINI in the last 168 hours. BNP: BNP (last 3 results) Recent Labs    09/16/20 2120 10/03/20 1353 10/08/20 0852  BNP 11.4 97.9 42.4    ProBNP (last 3 results) No results for input(s): PROBNP in the last 8760 hours.  CBG: Recent Labs  Lab 10/03/20 1242 10/04/20 0617 10/10/20 0840  GLUCAP 153* 98 127*       Signed:  Nita Sells MD   [facility] 10/10/2020, 10:18 AM

## 2020-10-10 NOTE — Progress Notes (Signed)
D/C instructions given and reviewed. Awaiting cath recovery and family to transport home later this afternoon.

## 2020-10-10 NOTE — Plan of Care (Signed)

## 2020-10-10 NOTE — Progress Notes (Addendum)
Site area: Right groin a 7 french venous sheath was removed  Site Prior to Removal:  Level 0  Pressure Applied For 15 MINUTES    Bedrest Beginning at 0900am X 2 hours  Manual:   Yes.    Patient Status During Pull:  stable  Post Pull Groin Site:  Level 0  Post Pull Instructions Given:  Yes.    Post Pull Pulses Present:  Yes.    Dressing Applied:  Yes.    Comments:  CBC and Creatine sent to Lab

## 2020-10-10 NOTE — Consult Note (Addendum)
REDS vest Ruler 26 Station C Reading 40%

## 2020-10-10 NOTE — TOC Transition Note (Addendum)
Transition of Care (TOC) - CM/SW Discharge Note [name] RN, BSN Transitions of Care Unit 4E- RN Case Manager See Treatment Team for direct phone # [phone] cross coverage   Patient Details  Name: Marie Church MRN: [id number] Date of Birth: Oct 11, 1955  Transition of Care [facility]) CM/SW Contact:  Dawayne Patricia, RN Phone Number: 10/10/2020, 3:23 PM   Clinical Narrative:    Pt stable for transition home, currently staying here in [place] with son,  Confirmed address with son as: [address], [place] [address] Pt will be following up with the HF clinic.   Has home 02 with [employer] and RW that was arranged on last discharge (5/22). Order placed for [facility] for HF management- pt is not insured, referral called to [place] agency for the week to see if pt will qualify for [facility] services- referral is pending  TOC will assist with filling meds for discharge. Son was able to pay for meds last time out of pocket and pt was not assisted using MATCH. Pt is eligible for MATCH if needed.   5/27 1300- received call from [facility] with [place] that they do have staffing for RN for charity needs, they will reach out to pt/son to see if pt qualifies for [facility]. If pt qualifies for [facility] then [facility] could do a delayed start of care after the holiday weekend- for next week on Tues/Wed.     Barriers to Discharge: Inadequate or no insurance   Patient Goals and CMS Choice Patient states their goals for this hospitalization and ongoing recovery are:: return home   Choice offered to / list presented to : NA [facility] referral- no insurance)  Discharge Placement                 Home w/ son [facility] Referral pending)      Discharge Plan and Services   Discharge Planning Services: CM Consult,Medication Assistance Post Acute Care Choice: NA                    HH Arranged: RN,Disease Management HH Agency: [place] Date [place]:  10/10/20 Time [place]: 1215 Representative spoke with at [place]: [place] ([place]) Interventions     Readmission Risk Interventions Readmission Risk Prevention Plan 10/10/2020 10/07/2020  Transportation Screening Complete Complete  PCP or Specialist Appt within 5-7 Days Complete Complete  Home Care Screening Complete Complete  Medication Review (RN CM) Complete Complete

## 2020-10-10 NOTE — Progress Notes (Signed)
Advanced Heart Failure Team Consult Note   Primary Physician: unknown Primary Cardiologist:  Lyman Bishop  Reason for Consultation: acute systolic CHF  HPI:    Diuresed well. Weight down another 2 pounds. Breathing better. No orthopnea or PND.   Cath today EF 25-30%. Normal cors relatively well compensated filling pressures with PCPW 12   Objective:    Vital Signs:   Temp:  [98.1 F (36.7 C)-98.6 F (37 C)] 98.6 F (37 C) (05/26 0416) Pulse Rate:  [80-93] 88 (05/26 0920) Resp:  [18-27] 22 (05/26 0920) BP: (90-146)/(50-105) 146/93 (05/26 0915) SpO2:  [94 %-100 %] 95 % (05/26 0920) Weight:  [83.6 kg] 83.6 kg (05/26 0417) Last BM Date: 10/09/20  Weight change: Filed Weights   10/08/20 2213 10/09/20 0506 10/10/20 0417  Weight: 85.9 kg 84.6 kg 83.6 kg    Intake/Output:   Intake/Output Summary (Last 24 hours) at 10/10/2020 0927 Last data filed at 10/10/2020 0653 Gross per 24 hour  Intake 845.94 ml  Output 2700 ml  Net -1854.06 ml    Scheduled Meds: . [MAR Hold] aspirin  81 mg Oral Daily  . [MAR Hold] enoxaparin (LOVENOX) injection  40 mg Subcutaneous Daily  . [MAR Hold] furosemide  60 mg Intravenous BID  . [MAR Hold] sacubitril-valsartan  1 tablet Oral BID  . [MAR Hold] sodium chloride flush  3 mL Intravenous Q12H  . sodium chloride flush  3 mL Intravenous Q12H  . [MAR Hold] spironolactone  12.5 mg Oral Daily   Continuous Infusions: . [MAR Hold] sodium chloride    . sodium chloride    . sodium chloride 10 mL/hr at 10/10/20 0635  . sodium chloride     PRN Meds:.[MAR Hold] sodium chloride, sodium chloride, [MAR Hold] acetaminophen, [MAR Hold] ondansetron (ZOFRAN) IV, ondansetron (ZOFRAN) IV, [MAR Hold] sodium chloride flush, sodium chloride flush   Physical Exam    General:  Lying in bed  No resp difficulty HEENT: normal Neck: supple. JVP 7. Carotids 2+ bilat; no bruits. No lymphadenopathy or thryomegaly appreciated. Cor: PMI nondisplaced. Regular rate &  rhythm. No rubs, gallops or murmurs. Lungs: clear Abdomen: soft, nontender, nondistended. No hepatosplenomegaly. No bruits or masses. Good bowel sounds. Extremities: no cyanosis, clubbing, rash, edema Neuro: alert & orientedx3, cranial nerves grossly intact. moves all 4 extremities w/o difficulty. Affect pleasant  Telemetry   NSR 80-90s Personally reviewed Labs   Basic Metabolic Panel: Recent Labs  Lab 10/05/20 0108 10/06/20 0142 10/07/20 0233 10/08/20 0653 10/09/20 0245 10/10/20 0307  NA 138 137 136 137 140 138  K 4.1 4.3 3.8 3.9 3.3* 4.7  CL 103 98 98 98 95* 96*  CO2 29 30 33* 30 35* 35*  GLUCOSE 103* 113* 163* 103* 128* 107*  BUN 5* 9 8 <5* <5* 8  CREATININE 0.69 0.75 0.83 0.63 0.63 0.82  CALCIUM 8.9 8.8* 8.7* 8.8* 8.9 9.1  MG 1.6*  --   --   --  1.8 2.0  PHOS 4.9*  --   --   --   --   --     Liver Function Tests: Recent Labs  Lab 10/10/20 0307  AST 32  ALT 18  ALKPHOS 57  BILITOT 0.8  PROT 6.6  ALBUMIN 3.1*   No results for input(s): LIPASE, AMYLASE in the last 168 hours. No results for input(s): AMMONIA in the last 168 hours.  CBC: Recent Labs  Lab 10/03/20 1353 10/04/20 0150 10/05/20 6644 10/06/20 0142 10/07/20 0347 10/08/20 4259 10/09/20 0245  WBC 5.7   < > 5.5 5.3 5.2 4.1 4.3  NEUTROABS 2.7  --   --   --   --   --   --   HGB 10.8*   < > 8.7* 9.8* 9.2* 9.5* 9.7*  HCT 35.8*   < > 28.7* 32.5* 30.7* 31.7* 32.0*  MCV 95.2   < > 95.3 95.0 96.8 95.8 95.2  PLT 250   < > 200 222 229 216 227   < > = values in this interval not displayed.    Cardiac Enzymes: No results for input(s): CKTOTAL, CKMB, CKMBINDEX, TROPONINI in the last 168 hours.  BNP: BNP (last 3 results) Recent Labs    09/16/20 2120 10/03/20 1353 10/08/20 0852  BNP 11.4 97.9 42.4    ProBNP (last 3 results) No results for input(s): PROBNP in the last 8760 hours.   CBG: Recent Labs  Lab 10/03/20 1242 10/04/20 0617 10/10/20 0840  GLUCAP 153* 98 127*    Coagulation  Studies: No results for input(s): LABPROT, INR in the last 72 hours.   Imaging   CARDIAC CATHETERIZATION  Result Date: 10/10/2020 Findings: Ao = 110/67 (87) LV = 115/19 RA = 5 RV = 43/8 PA = 43/18 (27) PCW = 12 Fick cardiac output/index = 8.2/4.2 PVR = 1.8 WU FA sat = 99% PA sat = 73%, 75% Assessment: 1. Normal cors 2. 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 Relatively well compensated hemodynamics with high cardiac output Plan/Discussion: Stop IV lasix. Switch to po. Continue to titrate GDMT. Likely can d/c soon. Glori Bickers, MD 9:27 AM    Medications:     Current Medications:    Infusions:        Assessment/Plan   1. 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 -normal ECHO 07/2020 although did have signs of basilar ground glass and pleural effusions on CTA which preceded ECHO -ECHO 09/2020 with EF 25-30%, moderate concentric LVH, global hypokinesis, mildly reduced RV systolic function, and mild-moderate MR, dilated IVC with <50% respiratory variability -Last dose of Herceptin was 09/27/2020 -Admitted with NYHA class 3 symptoms, volume up on exam REDS 45%, IVC dilated -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 although she does have moderate concentric LVH and had uncontrolled HTN before first admission, - Cath today normal coronaries EF 25-30%  OK to d/c home today   Entresto 24/26 bid Spiro 12.5 daily Lasix 40 daily Add Farxiga 10   Metastatic Breast Cancer: -Last dose of Herceptin was 09/27/2020, will need to stop this -[facility]  T2DM: -A1C 6.4 on 5/20 - Add Farxiga  Hypokalemia: -mag okay, K 4.7  OK to d/c home today   Entresto 24/26 bid Spiro 12.5 daily Lasix 40 daily Add Farxiga 10   Will arrange f/u in HF Clinic   Length of Stay: 2  Glori Bickers, MD  10/10/2020, 9:27 AM  Advanced Heart Failure  Team Pager [phone] (M-F; 7a - 4p)  Please contact [facility] for night-coverage after hours (4p -7a ) and weekends on [email or web address]

## 2020-10-10 NOTE — Interval H&P Note (Signed)
History and Physical Interval Note:  10/10/2020 7:39 AM  Marie Church  has presented today for surgery, with the diagnosis of heart failure.  The various methods of treatment have been discussed with the patient and family. After consideration of risks, benefits and other options for treatment, the patient has consented to  Procedure(s): RIGHT/LEFT HEART CATH AND CORONARY ANGIOGRAPHY (N/A) and possible coronary angioplasty as a surgical intervention.  The patient's history has been reviewed, patient examined, no change in status, stable for surgery.  I have reviewed the patient's chart and labs.  Questions were answered to the patient's satisfaction.     Quamere Mussell

## 2020-10-16 ENCOUNTER — Encounter (HOSPITAL_COMMUNITY): Payer: Self-pay | Admitting: Internal Medicine

## 2020-10-24 IMAGING — CR DG CHEST 2V
2 series · 2 of 2 positions shown · non-contrast
Comparison: Chest x-ray [DATE].  CT [DATE].

CLINICAL DATA: Chest pain.  History of breast cancer.

EXAM:
CHEST - 2 VIEW

[chest pa]
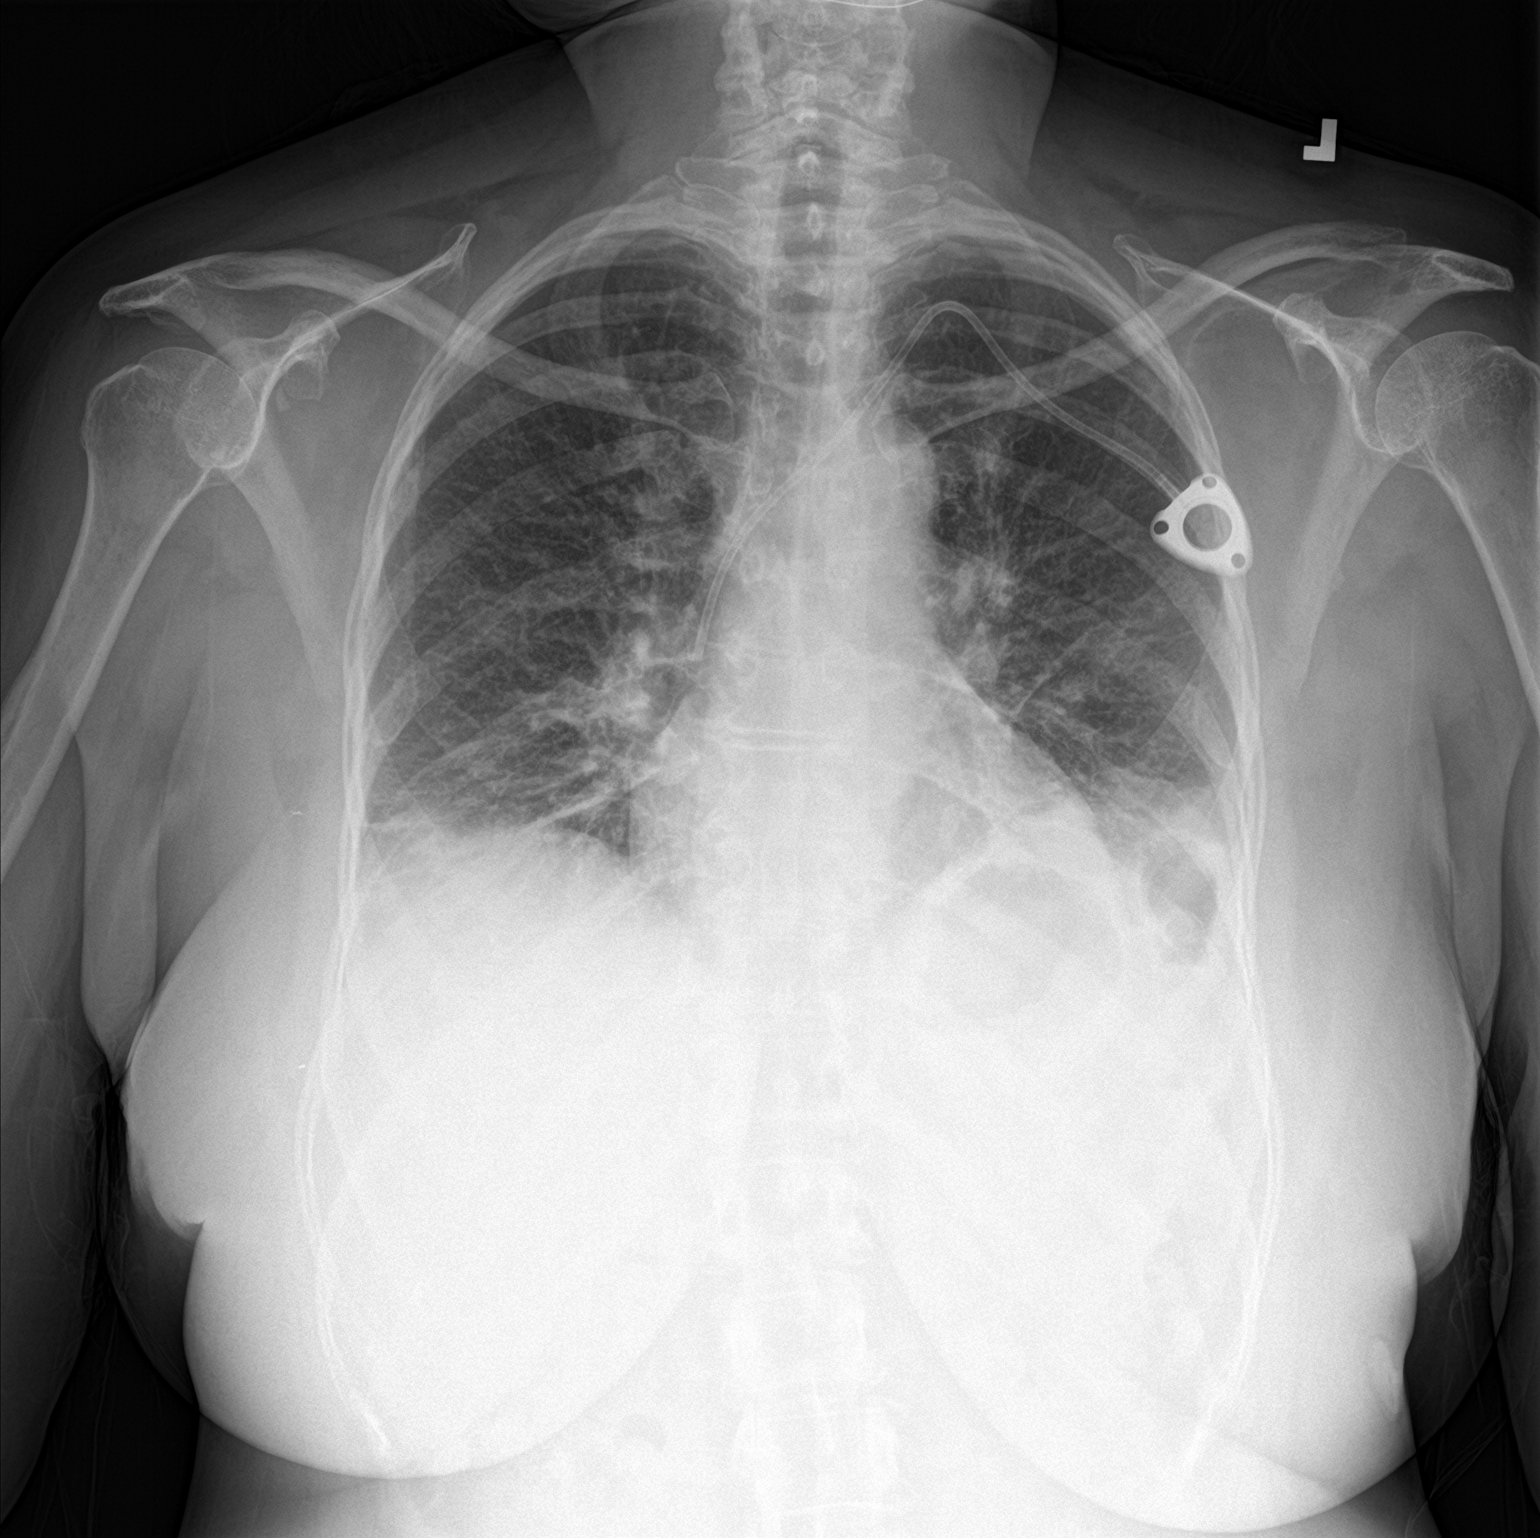

[chest lat]
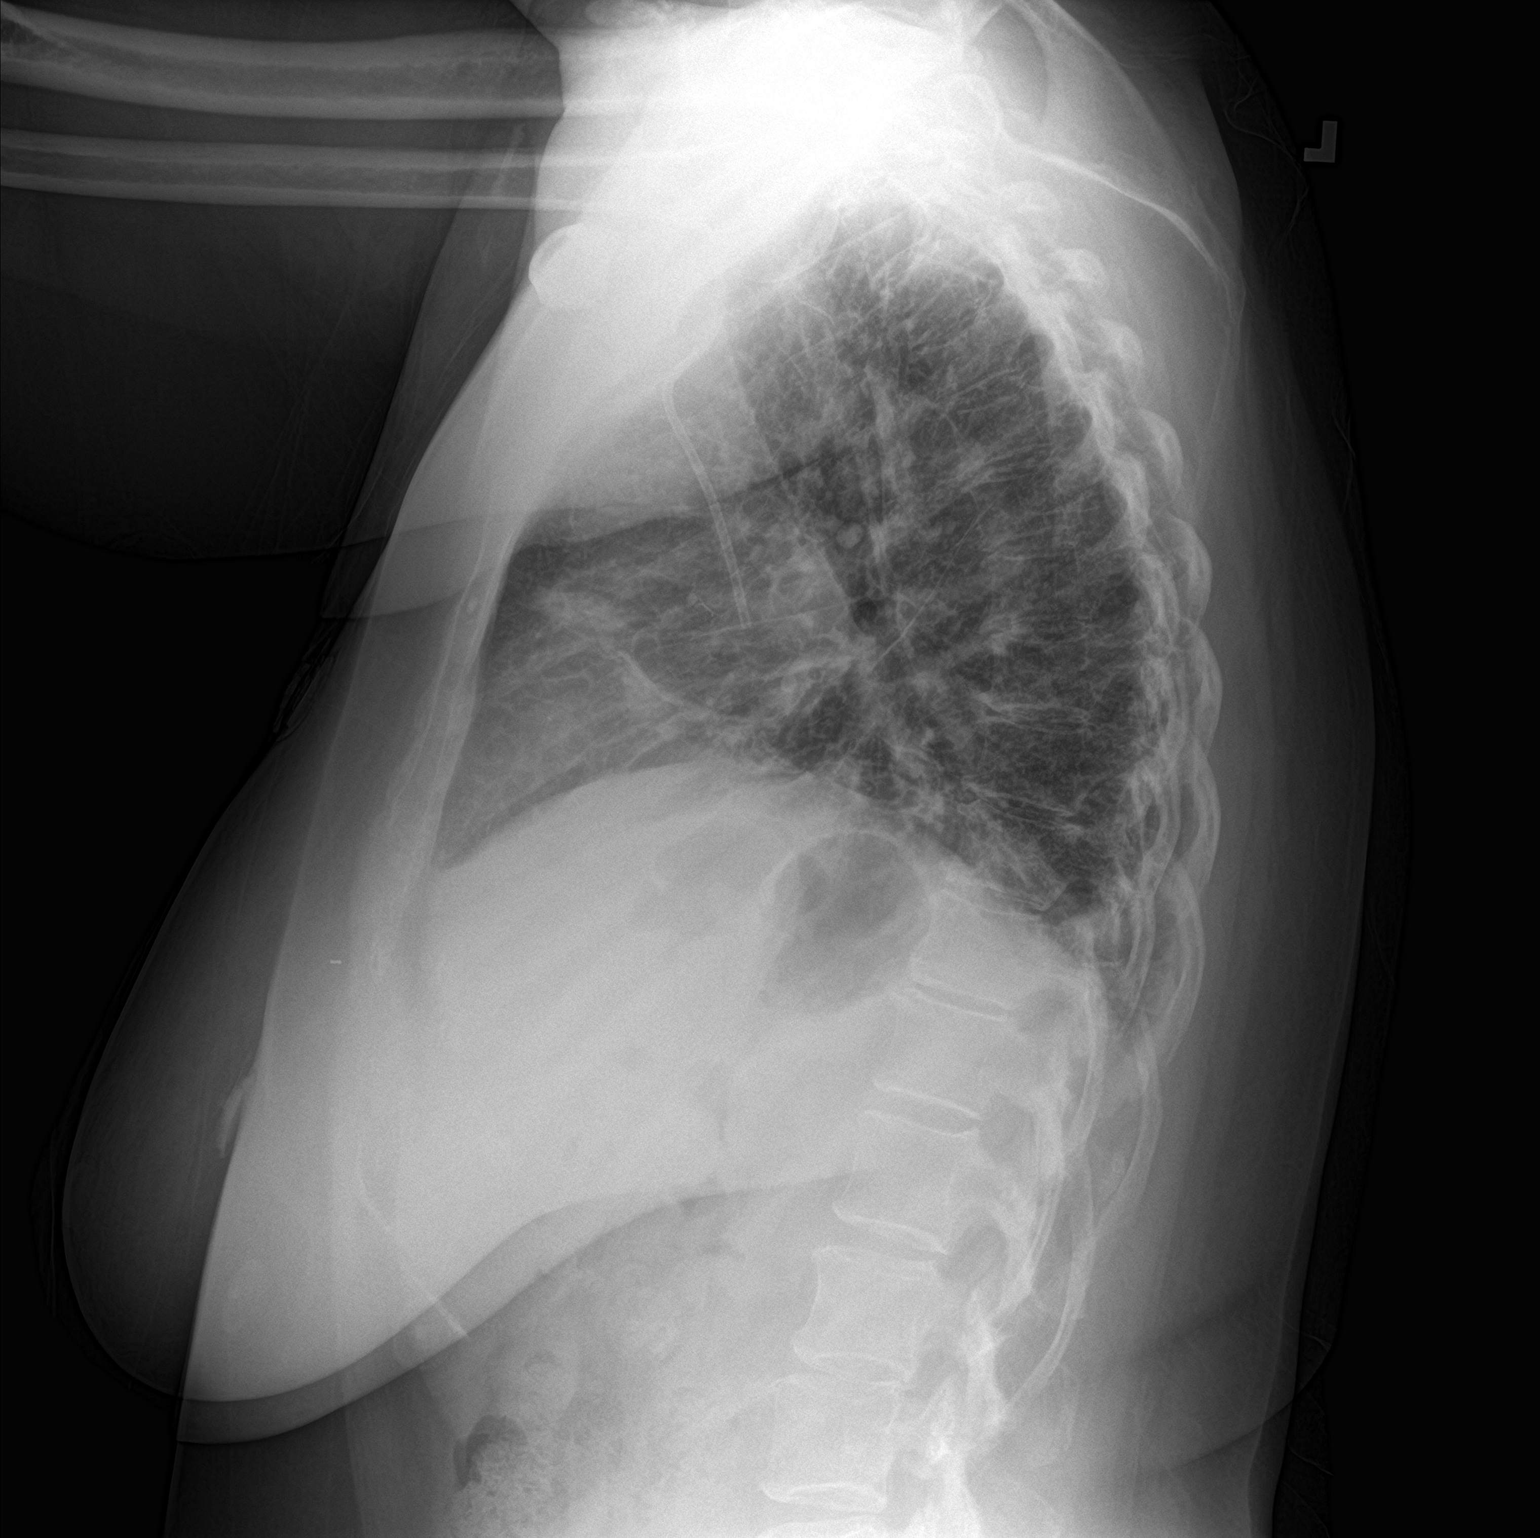

[2 of 2 positions shown; findings below may reference images not displayed]

FINDINGS: PowerPort catheter with tip over SVC. Heart size normal. Progressive
bibasilar atelectasis/infiltrates. No pleural effusion or
pneumothorax. Degenerative change and scoliosis thoracic spine.
IMPRESSION: 1.  PowerPort catheter with tip over SVC.

2.  Progressive bibasilar atelectasis/infiltrates.

## 2020-10-28 ENCOUNTER — Encounter: Payer: Self-pay | Admitting: Physician Assistant

## 2020-10-28 ENCOUNTER — Other Ambulatory Visit: Payer: Self-pay | Admitting: Physician Assistant

## 2020-10-28 ENCOUNTER — Ambulatory Visit (INDEPENDENT_AMBULATORY_CARE_PROVIDER_SITE_OTHER): Payer: Self-pay | Admitting: Physician Assistant

## 2020-10-28 ENCOUNTER — Other Ambulatory Visit: Payer: Self-pay

## 2020-10-28 VITALS — BP 130/78 | HR 108 | Ht 67.0 in | Wt 182.0 lb

## 2020-10-28 DIAGNOSIS — I10 Essential (primary) hypertension: Secondary | ICD-10-CM

## 2020-10-28 DIAGNOSIS — Z79899 Other long term (current) drug therapy: Secondary | ICD-10-CM

## 2020-10-28 DIAGNOSIS — I428 Other cardiomyopathies: Secondary | ICD-10-CM

## 2020-10-28 DIAGNOSIS — E119 Type 2 diabetes mellitus without complications: Secondary | ICD-10-CM

## 2020-10-28 MED ORDER — CARVEDILOL 3.125 MG PO TABS
3.1250 mg | ORAL_TABLET | Freq: Two times a day (BID) | ORAL | 11 refills | Status: DC
  Filled 2020-10-28 – 2020-10-30 (×2): qty 60, 30d supply, fill #0

## 2020-10-28 MED ORDER — SPIRONOLACTONE 25 MG PO TABS
12.5000 mg | ORAL_TABLET | Freq: Every day | ORAL | 11 refills | Status: DC
  Filled 2020-10-28: qty 15, 30d supply, fill #0

## 2020-10-28 MED ORDER — FUROSEMIDE 40 MG PO TABS
40.0000 mg | ORAL_TABLET | Freq: Every day | ORAL | 11 refills | Status: DC
  Filled 2020-10-28: qty 30, 30d supply, fill #0

## 2020-10-28 MED ORDER — DAPAGLIFLOZIN PROPANEDIOL 10 MG PO TABS
10.0000 mg | ORAL_TABLET | Freq: Every day | ORAL | 11 refills | Status: DC
  Filled 2020-10-28: qty 30, 30d supply, fill #0

## 2020-10-28 MED ORDER — SACUBITRIL-VALSARTAN 24-26 MG PO TABS
1.0000 | ORAL_TABLET | Freq: Two times a day (BID) | ORAL | 11 refills | Status: DC
  Filled 2020-10-28 – 2020-11-08 (×2): qty 60, 30d supply, fill #0
  Filled 2021-01-02: qty 60, 30d supply, fill #1
  Filled 2021-02-27 (×2): qty 60, 30d supply, fill #2

## 2020-10-28 NOTE — Patient Instructions (Signed)
Medication Instructions:  START Carvedilol (Coreg) 3.125 mg 2 times a day  *If you need a refill on your cardiac medications before your next appointment, please call your pharmacy*  Lab Work: Your physician recommends that you return for lab work TODAY:  BMET If you have labs (blood work) drawn today and your tests are completely normal, you will receive your results only by: [place] (if you have MyChart) OR A paper copy in the mail If you have any lab test that is abnormal or we need to change your treatment, we will call you to review the results.  Testing/Procedures: NONE ordered at this time of appointment   Follow-Up: At [facility], you and your health needs are our priority.  As part of our continuing mission to provide you with exceptional heart care, we have created designated Provider Care Teams.  These Care Teams include your primary Cardiologist (physician) and Advanced Practice Providers (APPs -  Physician Assistants and Nurse Practitioners) who all work together to provide you with the care you need, when you need it.  We recommend signing up for the patient portal called "MyChart".  Sign up information is provided on this After Visit Summary.  MyChart is used to connect with patients for Virtual Visits (Telemedicine).  Patients are able to view lab/test results, encounter notes, upcoming appointments, etc.  Non-urgent messages can be sent to your provider as well.   To learn more about what you can do with MyChart, go to [email or web address].    Your next appointment:   1 month(s) 4 month(s)  The format for your next appointment:   In Person In Person   Provider:   APPs  Dr. Sallyanne Kuster  Other Instructions

## 2020-10-28 NOTE — Progress Notes (Signed)
Cardiology Office Note:    Date:  10/30/2020   ID:  Marie Church, DOB 05/18/1956, MRN [id number]  PCP:  System, Provider Not In   [place] Providers Cardiologist:  Sanda Klein, MD     Referring MD: No ref. provider found   Chief Complaint  Patient presents with   Follow-up    Seen for Dr. Sallyanne Kuster    History of Present Illness:    Marie Church is a 65 y.o. female with a hx of hypertension, DM2, and metastatic breast cancer on neoadjuvant chemotherapy.  Previous nuclear stress test in 2010 showed no evidence of ischemia.  She was diagnosed with breast cancer in October 2021 and found to have metastasis to lymph nodes.  Chemotherapy plus minus radiation therapy and likely surgery once tumor size improved was recommended.  Echocardiogram obtained in March 2022 showed EF 62%, normal LV diastolic function, normal RV function, mild MR, mild to moderate TR, moderate pulmonary hypertension.    She normally lives in [place] however is visiting her son in [place].  She was admitted to the hospital in May 2022 due to worsening shortness of breath and lower extremity edema.  On arrival to the ED, systolic blood pressure was 210.  Serial high-sensitivity troponin was mildly elevated.  CTA of the chest showed no PE but with evidence of parenchymal scarring and atelectasis.  Echocardiogram showed EF 25 to 30%, moderate LVH, global hypokinesis, mildly reduced RV systolic function and mild to moderate MR.  She was treated with IV Lasix for acute combined systolic and diastolic heart failure.  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  She was placed on carvedilol, Entresto with recommendation to start on Farxiga as outpatient.  She was eventually discharged on Lasix 40 mg daily.  The recommendation is to consider medical therapy for 3 months and if EF remains low may consider ischemic work-up.  She was placed on home oxygen.  Unfortunately, patient returned  back to the hospital 1 day after discharge with shortness of breath.  Patient eventually underwent left and right heart cath on 10/10/2020 which showed cardiac output 8.2, cardiac index 4.2, wedge pressure 12, normal coronary arteries, EF 25 to 30%.  Her discharge weight was 83.6 kg.  She was discharged on 40 mg daily of Lasix.  Farxiga 10 mg was added to her medical regimen.  Patient presents today for follow-up.  She does not have any insurance.  [facility]  She will need medication assistance program for [place].  Heart rate borderline elevated, I will obtain a basic metabolic panel today and start on 3.125 mg twice a day of carvedilol.  I plan to bring the patient back in 1 month for reassessment.  She can follow-up with Dr. Sallyanne Kuster in 4 months.  Once her medication titration is completed, we will consider a repeat echocardiogram in mid September.   Past Medical History:  Diagnosis Date   Breast cancer ([place])    undergoing chemothearpy    DM type 2 (diabetes mellitus, type 2) ([place])    reportedly steroid induced   Hypertension     Past Surgical History:  Procedure Laterality Date   RIGHT/LEFT HEART CATH AND CORONARY ANGIOGRAPHY N/A 10/10/2020   Procedure: RIGHT/LEFT HEART CATH AND CORONARY ANGIOGRAPHY;  Surgeon: Jolaine Artist, MD;  Location: [facility];  Service: Cardiovascular;  Laterality: N/A;    Current Medications: Current Meds  Medication  Sig   aspirin 81 MG chewable tablet Chew 1 tablet (81 mg total) by mouth daily.   carvedilol (COREG) 3.125 MG tablet Take 1 tablet (3.125 mg total) by mouth 2 (two) times daily.   [DISCONTINUED] dapagliflozin propanediol (FARXIGA) 10 MG TABS tablet Take 1 tablet (10 mg total) by mouth daily before breakfast.   [DISCONTINUED] furosemide (LASIX) 40 MG tablet Take 1 tablet (40 mg total) by mouth daily.   [DISCONTINUED] sacubitril-valsartan (ENTRESTO)  24-26 MG Take 1 tablet by mouth 2 (two) times daily.   [DISCONTINUED] spironolactone (ALDACTONE) 25 MG tablet Take 0.5 tablets (12.5 mg total) by mouth daily.     Allergies:   Doxycycline, Amoxicillin-pot clavulanate, and Lisinopril   Social History   Socioeconomic History   Marital status: Single    Spouse name: Not on file   Number of children: Not on file   Years of education: Not on file   Highest education level: Not on file  Occupational History   Not on file  Tobacco Use   Smoking status: Never   Smokeless tobacco: Never  Vaping Use   Vaping Use: Never used  Substance and Sexual Activity   Alcohol use: Never   Drug use: Never   Sexual activity: Not on file  Other Topics Concern   Not on file  Social History Narrative   Not on file   Social Determinants of Health   Financial Resource Strain: Not on file  Food Insecurity: Not on file  Transportation Needs: Not on file  Physical Activity: Not on file  Stress: Not on file  Social Connections: Not on file     Family History: The patient's family history is negative for Heart failure, Heart disease, and Heart attack.  ROS:   Please see the history of present illness.     All other systems reviewed and are negative.  EKGs/Labs/Other Studies Reviewed:    The following studies were reviewed today:  Echo 10/04/2020  1. Left ventricular ejection fraction, by estimation, is 25 to 30%. Left  ventricular ejection fraction by 3D volume is 28 %. The left ventricle has  severely decreased function. The left ventricle demonstrates global  hypokinesis. There is moderate  concentric left ventricular hypertrophy. Indeterminate diastolic filling  due to E-A fusion. The average left ventricular global longitudinal strain  is -10.3 %. The global longitudinal strain is abnormal.   2. Right ventricular systolic function is mildly reduced. The right  ventricular size is normal.   3. The mitral valve is normal in structure.  There is mild-to-moderate  posterolaterally directed mitral regurgitation based on quantitative  assessment with EROA 0.15; RVol 27. Based on visual assesment, appears  more moderate. Mechanism appears  functional.   4. The aortic valve is tricuspid. There is mild calcification of the  aortic valve. There is mild thickening of the aortic valve. Aortic valve  regurgitation is not visualized. Mild aortic valve sclerosis is present,  with no evidence of aortic valve  stenosis.   5. The inferior vena cava is dilated in size with <50% respiratory  variability, suggesting right atrial pressure of 15 mmHg.   Comparison(s): No prior Echocardiogram.      Left and right heart cath 10/10/2020 Findings:   Ao = 110/67 (87) LV = 115/19 RA = 5 RV = 43/8 PA = 43/18 (27) PCW = 12 Fick cardiac output/index = 8.2/4.2 PVR = 1.8 WU FA sat = 99% PA sat = 73%, 75%   Assessment: 1. Normal cors 2.  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 Relatively well compensated hemodynamics with high cardiac output   Plan/Discussion:   Stop IV lasix. Switch to po. Continue to titrate GDMT. Likely can d/c soon.   Glori Bickers, MD  9:27 AM       EKG:  EKG is ordered today.  The ekg ordered today demonstrates sinus tachycardia, heart rate 108 bpm.  Recent Labs: 10/04/2020: TSH 2.281 10/08/2020: B Natriuretic Peptide 42.4 10/10/2020: ALT 18; Hemoglobin 10.2; Magnesium 2.0; Platelets 259 10/28/2020: BUN 15; Creatinine, Ser 0.90; Potassium 3.8; Sodium 143  Recent Lipid Panel    Component Value Date/Time   CHOL 91 10/05/2020 0108   TRIG 38 10/05/2020 0108   HDL 33 (L) 10/05/2020 0108   CHOLHDL 2.8 10/05/2020 0108   VLDL 8 10/05/2020 0108   LDLCALC 50 10/05/2020 0108     Risk Assessment/Calculations:       Physical Exam:    VS:  BP 130/78   Pulse (!) 108   Ht 5\' 7"  (1.702 m)   Wt 182 lb (82.6 kg)   BMI 28.51 kg/m     Wt Readings from Last 3 Encounters:  10/28/20 182 lb (82.6 kg)  10/10/20 184 lb 6.4 oz  (83.6 kg)  10/07/20 202 lb 2.6 oz (91.7 kg)     GEN:  Well nourished, well developed in no acute distress HEENT: Normal NECK: No JVD; No carotid bruits LYMPHATICS: No lymphadenopathy CARDIAC: RRR, no murmurs, rubs, gallops RESPIRATORY:  Clear to auscultation without rales, wheezing or rhonchi  ABDOMEN: Soft, non-tender, non-distended MUSCULOSKELETAL:  No edema; No deformity  SKIN: Warm and dry NEUROLOGIC:  Alert and oriented x 3 PSYCHIATRIC:  Normal affect   ASSESSMENT:    1. NICM (nonischemic cardiomyopathy) ([place])   2. Medication management   3. Primary hypertension   4. Hyperlipidemia LDL goal <70   5. Controlled type 2 diabetes mellitus without complication, without long-term current use of insulin (HCC)    PLAN:    In order of problems listed above:  Nonischemic cardiomyopathy: History of breast cancer status post chemotherapy.  Recent echocardiogram shows EF down to 25%.  Left and right heart cath revealed no significant coronary artery disease.  Add carvedilol 3.125 mg twice a day.  Continue Entresto and spironolactone.  Hypertension: Blood pressure stable, add carvedilol given LV dysfunction and mild tachycardia  DM2: Managed by primary care provider.  Was started on Farxiga.        Medication Adjustments/Labs and Tests Ordered: Current medicines are reviewed at length with the patient today.  Concerns regarding medicines are outlined above.  Orders Placed This Encounter  Procedures   Basic metabolic panel   EKG 32-GMWN   Meds ordered this encounter  Medications   carvedilol (COREG) 3.125 MG tablet    Sig: Take 1 tablet (3.125 mg total) by mouth 2 (two) times daily.    Dispense:  60 tablet    Refill:  11   dapagliflozin propanediol (FARXIGA) 10 MG TABS tablet    Sig: Take 1 tablet (10 mg total) by mouth daily before breakfast.    Dispense:  30 tablet    Refill:  11   furosemide (LASIX) 40 MG tablet    Sig: Take 1 tablet (40 mg total) by mouth daily.     Dispense:  30 tablet    Refill:  11   sacubitril-valsartan (ENTRESTO) 24-26 MG    Sig: Take 1 tablet by mouth 2 (two) times daily.    Dispense:  60 tablet  Refill:  11   spironolactone (ALDACTONE) 25 MG tablet    Sig: Take 0.5 tablets (12.5 mg total) by mouth daily.    Dispense:  15 tablet    Refill:  11    Patient Instructions  Medication Instructions:  START Carvedilol (Coreg) 3.125 mg 2 times a day  *If you need a refill on your cardiac medications before your next appointment, please call your pharmacy*  Lab Work: Your physician recommends that you return for lab work TODAY:  BMET If you have labs (blood work) drawn today and your tests are completely normal, you will receive your results only by: [employer] (if you have MyChart) OR A paper copy in the mail If you have any lab test that is abnormal or we need to change your treatment, we will call you to review the results.  Testing/Procedures: NONE ordered at this time of appointment   Follow-Up: At [facility], you and your health needs are our priority.  As part of our continuing mission to provide you with exceptional heart care, we have created designated Provider Care Teams.  These Care Teams include your primary Cardiologist (physician) and Advanced Practice Providers (APPs -  Physician Assistants and Nurse Practitioners) who all work together to provide you with the care you need, when you need it.  We recommend signing up for the patient portal called "MyChart".  Sign up information is provided on this After Visit Summary.  MyChart is used to connect with patients for Virtual Visits (Telemedicine).  Patients are able to view lab/test results, encounter notes, upcoming appointments, etc.  Non-urgent messages can be sent to your provider as well.   To learn more about what you can do with MyChart, go to [email or web address].    Your next appointment:   1 month(s) 4 month(s)  The format for your next  appointment:   In Person In Person   Provider:   APPs  Dr. Sallyanne Kuster  Other Instructions    Signed, Almyra Deforest, [place]  10/30/2020 11:53 PM    [place]

## 2020-10-29 LAB — BASIC METABOLIC PANEL
BUN/Creatinine Ratio: 17 (ref 12–28)
BUN: 15 mg/dL (ref 8–27)
CO2: 24 mmol/L (ref 20–29)
Calcium: 9.5 mg/dL (ref 8.7–10.3)
Chloride: 101 mmol/L (ref 96–106)
Creatinine, Ser: 0.9 mg/dL (ref 0.57–1.00)
Glucose: 97 mg/dL (ref 65–99)
Potassium: 3.8 mmol/L (ref 3.5–5.2)
Sodium: 143 mmol/L (ref 134–144)
eGFR: 72 mL/min/{1.73_m2} (ref >59)

## 2020-10-30 ENCOUNTER — Other Ambulatory Visit: Payer: Self-pay

## 2020-10-30 ENCOUNTER — Encounter: Payer: Self-pay | Admitting: Physician Assistant

## 2020-10-31 ENCOUNTER — Telehealth: Payer: Self-pay | Admitting: Licensed Clinical Social Worker

## 2020-10-31 NOTE — Progress Notes (Signed)
Heart and Vascular Care Navigation  10/31/2020  Marie Church [birth year] [id number]  Reason for Referral:    Engaged with patient by telephone for initial visit for Heart and Vascular Care Coordination.                                                                                                   Assessment:         LCSW reached pt via telephone this morning at [phone]. Introduced self, role, reason for call. Pt shares she is currently staying with her son at [address] [place], [place], [address]. Pt was able to get her medications at low cost at [facility] yesterday. She is looking into moving to [place] permanently, having some difficulty locating housing that meets her current income as she is receiving only [place] at this time, she is able to get to ongoing appointments. Pt confirms she has [place] and Entresto PAP. I encouraged her to bring those when completed by [facility] office or to her upcoming appt with AHF clinic. Pt not eligible for Medicaid at this time per financial counseling notes- they have tried to reach her for CAFA, she has not yet received it at this time. LCSW offered to send the following resources to pt along with my card for f/u and pt amenable: PCP list, transportation resources, CAFA form w/ instructions, NCMedAssist application, Social[employer]erve housing list. I remain available for any additional needs moving forward. I will f/u in one week to see if resources received and ensure pt still has a ride to appt on 6/28.                           HRT/VAS Care Coordination     Patients Home Cardiology Office [place] Team Social Worker   Social Worker Name: Margarito Liner Lawson, [phone]   Living arrangements for the past 2 months Single Family Home   Lives with: Adult Children   Patient Current Insurance Coverage Self-Pay   Patient Has Concern With [place] Yes   Patient  Concerns With Medical Bills multiple past hospitalizations   Medical Bill Referrals: CAFA mailed   Does Patient Have Prescription Coverage? No   Patient Prescription Assistance Programs [place] [facility]; Patient Assistance Programs   [place] [facility] Medications mailed application   Home Assistive Devices/Equipment None   DME Agency [employer]   [place]   Current home services DME  home 02- [employer], RW       Social History:                                                                             SDOH Screenings   Alcohol Screen: Not on file  Depression (  [place]): Not on file  Financial Resource Strain: Low Risk    Difficulty of Paying Living Expenses: Not very hard  Food Insecurity: No Food Insecurity   Worried About Running Out of Food in the Last Year: Never true   Ran Out of Food in the Last Year: Never true  Housing: Low Risk    Last Housing Risk Score: 0  Physical Activity: Not on file  Social Connections: Not on file  Stress: Not on file  Tobacco Use: Low Risk    Smoking Tobacco Use: Never   Smokeless Tobacco Use: Never  Transportation Needs: No Transportation Needs   Lack of Transportation (Medical): No   Lack of Transportation (Non-Medical): No    SDOH Interventions: Financial Resources:  Financial Strain Interventions: Other (Comment) 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, Patient Assistance Programs   Follow-up plan:

## 2020-11-04 NOTE — Progress Notes (Signed)
Stable renal function and electrolyte

## 2020-11-05 ENCOUNTER — Encounter: Payer: Self-pay | Admitting: *Deleted

## 2020-11-07 ENCOUNTER — Telehealth: Payer: Self-pay | Admitting: Physician Assistant

## 2020-11-07 ENCOUNTER — Other Ambulatory Visit: Payer: Self-pay

## 2020-11-07 MED ORDER — ASPIRIN 81 MG PO CHEW
81.0000 mg | CHEWABLE_TABLET | Freq: Every day | ORAL | 3 refills | Status: DC
  Filled 2020-11-07: qty 30, 30d supply, fill #0

## 2020-11-07 MED ORDER — SPIRONOLACTONE 25 MG PO TABS
12.5000 mg | ORAL_TABLET | Freq: Every day | ORAL | 11 refills | Status: DC
  Filled 2020-11-07: qty 30, 60d supply, fill #0
  Filled 2021-01-02: qty 30, 60d supply, fill #1
  Filled 2021-02-27: qty 30, 60d supply, fill #2
  Filled 2021-06-17: qty 30, 60d supply, fill #0

## 2020-11-07 MED ORDER — DAPAGLIFLOZIN PROPANEDIOL 10 MG PO TABS
10.0000 mg | ORAL_TABLET | Freq: Every day | ORAL | 11 refills | Status: DC
  Filled 2020-11-07: qty 30, 30d supply, fill #0

## 2020-11-07 MED ORDER — FUROSEMIDE 40 MG PO TABS
40.0000 mg | ORAL_TABLET | Freq: Every day | ORAL | 11 refills | Status: DC
  Filled 2020-11-07: qty 30, 30d supply, fill #0
  Filled 2020-12-12: qty 30, 30d supply, fill #1

## 2020-11-07 NOTE — Telephone Encounter (Signed)
*  STAT* If patient is at the pharmacy, call can be transferred to refill team.   1. Which medications need to be refilled? (please list name of each medication and dose if known)  Furosemide, Farxiga, Spironolactone and 61 mg Aspirin  2. Which pharmacy/location (including street and city if local pharmacy) is medication to be sent to? [facility]   3. Do they need a 30 day or 90 day supply? 30 days and refills

## 2020-11-08 ENCOUNTER — Other Ambulatory Visit: Payer: Self-pay

## 2020-11-11 ENCOUNTER — Telehealth: Payer: Self-pay | Admitting: Licensed Clinical Social Worker

## 2020-11-11 NOTE — Telephone Encounter (Signed)
Spoke with pt this morning via telephone at [phone]; re-introduced self, role, reason for call. Reminded pt of appt tomorrow at [facility] clinic. Encouraged her to bring PAP forms with her along with CAFA if any additional assistance desired. Pt shares she can still make it to the appt and denies any difficulty at this time. Let her know I remain available should any concerns/questions arise.   Westley Hummer, MSW, [place]  [phone]

## 2020-11-12 ENCOUNTER — Encounter (HOSPITAL_COMMUNITY): Payer: Self-pay

## 2020-11-12 ENCOUNTER — Ambulatory Visit (HOSPITAL_COMMUNITY)
Admission: RE | Admit: 2020-11-12 | Discharge: 2020-11-12 | Disposition: A | Discharge status: Home / Self Care | Payer: Self-pay | Source: Ambulatory Visit | Attending: Family Medicine | Admitting: Family Medicine

## 2020-11-12 ENCOUNTER — Telehealth (HOSPITAL_COMMUNITY): Payer: Self-pay | Admitting: Pharmacy Technician

## 2020-11-12 ENCOUNTER — Other Ambulatory Visit (HOSPITAL_COMMUNITY): Payer: Self-pay

## 2020-11-12 ENCOUNTER — Other Ambulatory Visit: Payer: Self-pay

## 2020-11-12 VITALS — BP 110/86 | HR 87 | Ht 66.0 in | Wt 181.0 lb

## 2020-11-12 DIAGNOSIS — C50919 Malignant neoplasm of unspecified site of unspecified female breast: Secondary | ICD-10-CM

## 2020-11-12 DIAGNOSIS — I5022 Chronic systolic (congestive) heart failure: Secondary | ICD-10-CM

## 2020-11-12 DIAGNOSIS — E119 Type 2 diabetes mellitus without complications: Secondary | ICD-10-CM

## 2020-11-12 DIAGNOSIS — I110 Hypertensive heart disease with heart failure: Secondary | ICD-10-CM | POA: Insufficient documentation

## 2020-11-12 DIAGNOSIS — I428 Other cardiomyopathies: Secondary | ICD-10-CM

## 2020-11-12 DIAGNOSIS — Z79899 Other long term (current) drug therapy: Secondary | ICD-10-CM | POA: Insufficient documentation

## 2020-11-12 DIAGNOSIS — Z87891 Personal history of nicotine dependence: Secondary | ICD-10-CM | POA: Insufficient documentation

## 2020-11-12 DIAGNOSIS — Z7982 Long term (current) use of aspirin: Secondary | ICD-10-CM | POA: Insufficient documentation

## 2020-11-12 LAB — BASIC METABOLIC PANEL
Anion gap: 7 (ref 5–15)
BUN: 15 mg/dL (ref 8–23)
CO2: 30 mmol/L (ref 22–32)
Calcium: 9.5 mg/dL (ref 8.9–10.3)
Chloride: 102 mmol/L (ref 98–111)
Creatinine, Ser: 0.84 mg/dL (ref 0.44–1.00)
GFR, Estimated: >60 mL/min (ref >60)
Glucose, Bld: 97 mg/dL (ref 70–99)
Potassium: 3.1 mmol/L — ABNORMAL LOW (ref 3.5–5.1)
Sodium: 139 mmol/L (ref 135–145)

## 2020-11-12 LAB — BRAIN NATRIURETIC PEPTIDE: B Natriuretic Peptide: 12 pg/mL (ref 0.0–100.0)

## 2020-11-12 NOTE — Telephone Encounter (Signed)
Advanced Heart Failure Patient Advocate Encounter  Patient was seen in clinic today and started on Entresto and Farxiga.   The patient is currently uninsured. Started an [place] for [employer] and [employer].   Will fax in once signatures are obtained.

## 2020-11-12 NOTE — Progress Notes (Signed)
Advanced Heart Failure Team Clinic Note  PCP: none Primary Cardiologist: Dr. Sallyanne Kuster HF Cardiologist: Dr. Haroldine Laws  HPI: 65 yo female with PMH of HTN, T2DM, metastatic breast cancer currently on neoadjuvant chemotherapy, recently diagnosed combined systolic CHF. She lives in [place], [place].   Indeterminate Intraductal papilloma diagnosed in 2019.  Pathology neg for malignancy in 2020.  First was diagnosed with breast cancer 02/2020. She was found to have metastatic disease to lymph nodes. A multimodal treatment approach was recommended including chemotherapy +/- radiation and likely surgery once tumor size improves. She was started on taxotere, carboplatin, herceptin, and perjeta q3 weeks x6 cycles. Echocardiogram 07/2020 for 3 month follow-up while on herceptin chemotherapy showed EF 62%, normal LV/RV function, mild MR, mild-mod TR, and mod pulmonary HTN.  Last dose of Herceptin was 09/27/2020. Her last ischemic evaluation appears to be a NST in 2010 which showed no evidence of ischemia. She had serial CT PE studies.  One on 07/20/2020 with no PE but bilateral ground glass and intralobular septal thickening and trace bilateral pleural effusions, subsegmental atelectasis.     She has been undergoing neoadjuvant chemotherapy since 03/2020 and has now completed 6 cycles of TCR-P as of 4 weeks ago.   She was admitted on 10/03/20 with acute systolic CHF. Echocardiogram obtained showing EF 25-30%, moderate concentric LVH, global hypokinesis, mildly reduced RV systolic function, and mild-moderate MR, dilated IVC.  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  She was given IV lasix 40mg , then transitioned to oral 40mg  daily.  She was started on GDMT, sent home with 2L [place] supplemental oxygen.    Today she returns for HF follow up. Overall feeling fine. Feeling dizzy a lot, regardless of activity, no falls though. Denies increasing SOB, CP, edema, or PND/Orthopnea. Appetite poor.  No fever or chills. Weight at home 182 pounds. Taking all medications. She is ready for mastectomy and would like HF opinion before proceeding with surgery. She had repeat echo last week in [place] and was told it was OK to proceed with surgery.  Cardiac Studies:    - Echo (5/22): 5-30%, moderate concentric LVH, global hypokinesis, mildly reduced RV systolic function, and mild-moderate MR, dilated IVC.    - Cath (5/22): EF 25-30%. Normal cors relatively well compensated filling pressures.   Findings:   Ao = 110/67 (87) LV = 115/19 RA = 5 RV = 43/8 PA = 43/18 (27) PCW = 12 Fick cardiac output/index = 8.2/4.2 PVR = 1.8 WU FA sat = 99% PA sat = 73%, 75%  ROS: All systems negative except as listed in HPI, PMH and Problem List.  SH:  Social History   Socioeconomic History   Marital status: Single    Spouse name: Not on file   Number of children: Not on file   Years of education: Not on file   Highest education level: Not on file  Occupational History   Not on file  Tobacco Use   Smoking status: Former    Pack years: 0.00    Types: Cigarettes   Smokeless tobacco: Never  Vaping Use   Vaping Use: Never used  Substance and Sexual Activity   Alcohol use: Never   Drug use: Never   Sexual activity: Not on file  Other Topics Concern   Not on file  Social History Narrative   Not on file   Social Determinants of Health   Financial Resource Strain: Low Risk    Difficulty of Paying Living Expenses: Not  very hard  Food Insecurity: No Food Insecurity   Worried About Charity fundraiser in the Last Year: Never true   Ran Out of Food in the Last Year: Never true  Transportation Needs: No Transportation Needs   Lack of Transportation (Medical): No   Lack of Transportation (Non-Medical): No  Physical Activity: Not on file  Stress: Not on file  Social Connections: Not on file  Intimate Partner Violence: Not on file   FH:  Family History  Problem Relation Age of Onset    Heart failure Neg Hx    Heart disease Neg Hx    Heart attack Neg Hx    Past Medical History:  Diagnosis Date   Breast cancer ([place])    undergoing chemothearpy    DM type 2 (diabetes mellitus, type 2) ([place])    reportedly steroid induced   Hypertension    Current Outpatient Medications  Medication Sig Dispense Refill   aspirin 81 MG chewable tablet Chew 1 tablet (81 mg total) by mouth daily. 30 tablet 3   carvedilol (COREG) 6.25 MG tablet Take 6.25 mg by mouth 2 (two) times daily with a meal.     dapagliflozin propanediol (FARXIGA) 10 MG TABS tablet Take 1 tablet (10 mg total) by mouth daily before breakfast. 30 tablet 11   furosemide (LASIX) 40 MG tablet Take 1 tablet (40 mg total) by mouth daily. 30 tablet 11   sacubitril-valsartan (ENTRESTO) 24-26 MG Take 1 tablet by mouth 2 (two) times daily. 60 tablet 11   spironolactone (ALDACTONE) 25 MG tablet Take 0.5 tablets (12.5 mg total) by mouth daily. 30 tablet 11   No current facility-administered medications for this encounter.   BP 110/86   Pulse 87   Ht 5\' 6"  (1.676 m)   Wt 82.1 kg (181 lb)   SpO2 97%   BMI 29.21 kg/m   Wt Readings from Last 3 Encounters:  11/12/20 82.1 kg (181 lb)  10/28/20 82.6 kg (182 lb)  10/10/20 83.6 kg (184 lb 6.4 oz)   PHYSICAL EXAM: General:  NAD. No resp difficulty HEENT: Normal Neck: Supple. No JVD. Carotids 2+ bilat; no bruits. No lymphadenopathy or thryomegaly appreciated. Large cyst on right neck Cor: PMI nondisplaced. Regular rate & rhythm. No rubs, gallops or murmurs. Lungs: Clear, port left upper chest Abdomen: Soft, nontender, nondistended. No hepatosplenomegaly. No bruits or masses. Good bowel sounds. Extremities: No cyanosis, clubbing, rash, edema, trace LE edema. Neuro: Alert & oriented x 3, cranial nerves grossly intact. Moves all 4 extremities w/o difficulty. Affect pleasant.  ASSESSMENT & PLAN:  1. Chronic Systolic CHF: - New diagnosis as of 10/04/20, presumed to be a chemotherapy  induced cardiomyopathy likely culprit being herceptin - Normal Echo 3/22 although did have signs of basilar ground glass and pleural effusions on CTA which preceded Echo - Echo 5/22 with EF 25-30%, moderate concentric LVH, global hypokinesis, mildly reduced RV systolic function, and mild-moderate MR, dilated IVC with <50% respiratory variability - Last dose of Herceptin was 09/27/2020. - Admitted with NYHA class 3 symptoms, REDS 45%, IVC dilated - 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 although she does have moderate concentric LVH and had uncontrolled HTN before first admission, - Cath w/ normal coronaries EF 25-30%. - NYHA II, volume good today. - Continue Entresto 24/26 mg bid.  - Continue carvedilol 6.25 mg bid. - Continue lasix 40 mg daily. - Continue spiro 12.5 mg daily. - Continue Farxiga 10 mg daily. - BMET today. - Beta blocker recently added. Will  not increase other meds at this time w/ her dizziness.   2. Metastatic Breast Cancer: - Last dose of Herceptin was 09/27/2020, will need to stop this. - Followed [facility] Dr. [facility] - OK to proceed with mastectomy, personally discussed with Dr. Haroldine Laws.   3. T2DM: - A1C 6.4 on 5/20 - On Farxiga.  Follow up in 3-4 weeks with PharmD, 6-8 weeks with APP for further medication titration.  - She does not have insurance. Her meds are to be picked up from [place].  Allena Katz, FNP-BC 11/12/20

## 2020-11-12 NOTE — Progress Notes (Signed)
CSW met with patient and son in the clinic. Patient reports she does not have medication PAP applications and requested assistance with both [place] and Entresto. CSW followed up on information sent to patient regarding CAFA and housing list and patient reports she has not received in the mail yet. CSW provided a CAFA application and explained needed documents and to bring completed application to [facility] office on next visit. CSW provided housing information and encouraged patient and son to make an online application for senior housing as patient requested subsidized housing. Patient and son verbalize understanding of follow and grateful for the support. CSW contacted CSW at [facility] to inform of today's visit and needed follow up on next visit.Marie Church, [name], [name]

## 2020-11-12 NOTE — Patient Instructions (Signed)
It was great to see you today! No medication changes are needed at this time.   Labs today We will only contact you if something comes back abnormal or we need to make some changes. Otherwise no news is good news!  Your physician recommends that you schedule a follow-up appointment in: 3-4 weeks with the pharmacy team  Do the following things EVERYDAY: Weigh yourself in the morning before breakfast. Write it down and keep it in a log. Take your medicines as prescribed Eat low salt foods--Limit salt (sodium) to 2000 mg per day.  Stay as active as you can everyday Limit all fluids for the day to less than 2 liters  At the [facility], you and your health needs are our priority. As part of our continuing mission to provide you with exceptional heart care, we have created designated Provider Care Teams. These Care Teams include your primary Cardiologist (physician) and Advanced Practice Providers (APPs- Physician Assistants and Nurse Practitioners) who all work together to provide you with the care you need, when you need it.   You may see any of the following providers on your designated Care Team at your next follow up: Dr Glori Bickers Dr Loralie Champagne Dr Patrice Paradise, NP Lyda Jester, [place] Ginnie Smart Audry Riles, PharmD   Please be sure to bring in all your medications bottles to every appointment.

## 2020-11-13 NOTE — Telephone Encounter (Addendum)
Sent in both applications via fax.  Will follow up.  Advanced Heart Failure Patient Advocate Encounter   Patient was approved to receive Farxiga from [employer]  Patient ID: [id number] Effective dates: 11/14/20 through 11/13/21

## 2020-11-14 ENCOUNTER — Other Ambulatory Visit: Payer: Self-pay

## 2020-11-14 ENCOUNTER — Telehealth (HOSPITAL_COMMUNITY): Payer: Self-pay | Admitting: Cardiology

## 2020-11-14 DIAGNOSIS — I5022 Chronic systolic (congestive) heart failure: Secondary | ICD-10-CM

## 2020-11-14 MED ORDER — POTASSIUM CHLORIDE CRYS ER 20 MEQ PO TBCR
40.0000 meq | EXTENDED_RELEASE_TABLET | Freq: Every day | ORAL | 3 refills | Status: DC
  Filled 2020-11-14 – 2020-12-13 (×2): qty 60, 30d supply, fill #0

## 2020-11-14 NOTE — Telephone Encounter (Signed)
Patient called.  Patient aware. Repeat labs at f/u 7/12 Orders placed

## 2020-11-14 NOTE — Telephone Encounter (Signed)
-----   Message from Rafael Bihari, [place] sent at 11/12/2020  6:35 PM EDT ----- Potassium is low. Please start KCl 40 mEq daily. Will repeat BMET in 10 days.

## 2020-11-19 NOTE — Telephone Encounter (Addendum)
Advanced Heart Failure Patient Advocate Encounter    Received a fax from [employer] regarding an approval for  Entresto  patient assistance from 11/15/20 to 11/15/21.   Phone number: [phone]   Called patient and advised.

## 2020-11-21 ENCOUNTER — Other Ambulatory Visit: Payer: Self-pay

## 2020-11-25 ENCOUNTER — Telehealth: Payer: Self-pay | Admitting: Licensed Clinical Social Worker

## 2020-11-25 NOTE — Telephone Encounter (Signed)
Called and reminded pt of appt tomorrow, encouraged her to bring applications provided during AHF clinic appt to office tomorrow. She shares that she will. Will plan to meet pt tomorrow at end of appt to review paperwork.   Westley Hummer, MSW, [place]  [phone]

## 2020-11-26 ENCOUNTER — Ambulatory Visit (INDEPENDENT_AMBULATORY_CARE_PROVIDER_SITE_OTHER): Payer: Self-pay | Admitting: Physician Assistant

## 2020-11-26 ENCOUNTER — Other Ambulatory Visit (HOSPITAL_COMMUNITY): Payer: Self-pay

## 2020-11-26 ENCOUNTER — Encounter: Payer: Self-pay | Admitting: Physician Assistant

## 2020-11-26 ENCOUNTER — Other Ambulatory Visit: Payer: Self-pay

## 2020-11-26 VITALS — BP 96/76 | HR 104 | Ht 66.0 in | Wt 182.0 lb

## 2020-11-26 DIAGNOSIS — E119 Type 2 diabetes mellitus without complications: Secondary | ICD-10-CM

## 2020-11-26 DIAGNOSIS — C50919 Malignant neoplasm of unspecified site of unspecified female breast: Secondary | ICD-10-CM

## 2020-11-26 DIAGNOSIS — R000 Tachycardia, unspecified: Secondary | ICD-10-CM

## 2020-11-26 DIAGNOSIS — I10 Essential (primary) hypertension: Secondary | ICD-10-CM

## 2020-11-26 DIAGNOSIS — I5022 Chronic systolic (congestive) heart failure: Secondary | ICD-10-CM

## 2020-11-26 NOTE — Patient Instructions (Addendum)
Medication Instructions:  Your physician recommends that you continue on your current medications as directed. Please refer to the Current Medication list given to you today.  *If you need a refill on your cardiac medications before your next appointment, please call your pharmacy*  Lab Work: NONE ordered at this time of appointment   If you have labs (blood work) drawn today and your tests are completely normal, you will receive your results only by: [place] (if you have MyChart) OR A paper copy in the mail If you have any lab test that is abnormal or we need to change your treatment, we will call you to review the results.  Testing/Procedures: Your physician has requested that you have an echocardiogram. Echocardiography is a painless test that uses sound waves to create images of your heart. It provides your doctor with information about the size and shape of your heart and how well your heart's chambers and valves are working. This procedure takes approximately one hour. There are no restrictions for this procedure.  Please schedule for October prior to 03/05/21 follow up with Dr. Sallyanne Kuster at [facility] office   Follow-Up: At [facility], you and your health needs are our priority.  As part of our continuing mission to provide you with exceptional heart care, we have created designated Provider Care Teams.  These Care Teams include your primary Cardiologist (physician) and Advanced Practice Providers (APPs -  Physician Assistants and Nurse Practitioners) who all work together to provide you with the care you need, when you need it.  We recommend signing up for the patient portal called "MyChart".  Sign up information is provided on this After Visit Summary.  MyChart is used to connect with patients for Virtual Visits (Telemedicine).  Patients are able to view lab/test results, encounter notes, upcoming appointments, etc.  Non-urgent messages can be sent to your provider as  well.   To learn more about what you can do with MyChart, go to [email or web address].    Your next appointment:   As schedule   The format for your next appointment:   In Person  Provider:   Sanda Klein, MD  Other Instructions

## 2020-11-26 NOTE — Progress Notes (Signed)
Cardiology Office Note:    Date:  11/28/2020   ID:  Marie Church, DOB 08-18-1955, MRN [id number]  PCP:  System, Provider Not In   [place] Providers Cardiologist:  Sanda Klein, MD     Referring MD: No ref. provider found   Chief Complaint  Patient presents with   Follow-up    Seen for Dr. Sallyanne Kuster    History of Present Illness:    Marie Church is a 65 y.o. female with a hx of hypertension, DM2, and metastatic breast cancer on neoadjuvant chemotherapy.  Previous nuclear stress test in 2010 showed no evidence of ischemia.  She was diagnosed with breast cancer in October 2021 and found to have metastasis to lymph nodes.  Chemotherapy +/- radiation therapy and likely surgery once tumor size improved was recommended.  Echocardiogram obtained in March 2022 showed EF 62%, normal LV diastolic function, normal RV function, mild MR, mild to moderate TR, moderate pulmonary hypertension.    She normally lives in [place] however is visiting her son in [place].  She was admitted to the hospital in May 2022 due to worsening shortness of breath and lower extremity edema.  On arrival to the ED, systolic blood pressure was 210.  Serial high-sensitivity troponin was mildly elevated.  CTA of the chest showed no PE but with evidence of parenchymal scarring and atelectasis.  Echocardiogram showed EF 25 to 30%, moderate LVH, global hypokinesis, mildly reduced RV systolic function and mild to moderate MR.  She was treated with IV Lasix for acute combined systolic and diastolic heart failure.  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  She was placed on carvedilol, Entresto with recommendation to start on Farxiga as outpatient.  She was eventually discharged on Lasix 40 mg daily.  The recommendation is to consider medical therapy for 3 months and if EF remains low may consider ischemic work-up.  She was placed on home oxygen.  Unfortunately, patient returned back to  the hospital 1 day after discharge with shortness of breath.  Patient eventually underwent left and right heart cath on 10/10/2020 which showed cardiac output 8.2, cardiac index 4.2, wedge pressure 12, normal coronary arteries, EF 25 to 30%.  Her discharge weight was 83.6 kg.  She was discharged on 40 mg daily of Lasix.  Farxiga 10 mg was added to her medical regimen.  I last saw the patient on 10/28/2020, at which time she was mildly tachycardic in the setting of nonischemic cardiomyopathy.  I added carvedilol 3.125 mg twice a day.  I also started her on Farxiga.  She was recently seen by heart failure service on 11/04/2020, she was stable at the time with occasional dizziness.  She was cleared to proceed with mastectomy if needed.  Since the last visit, blood work shows low potassium level, she was started on 40 meq daily of potassium chloride.  Patient presents today for follow-up.  Heart rate is remain borderline elevated.  On exam, she appears to be euvolemic.  Blood pressure does not allow me to further uptitrate heart failure medication.  Plan to repeat echocardiogram prior to the next visit.  Past Medical History:  Diagnosis Date   Breast cancer ([place])    undergoing chemothearpy    DM type 2 (diabetes mellitus, type 2) ([place])    reportedly steroid induced   Hypertension     Past Surgical History:  Procedure Laterality Date   RIGHT/LEFT HEART CATH AND CORONARY ANGIOGRAPHY N/A 10/10/2020   Procedure: RIGHT/LEFT HEART CATH AND  CORONARY ANGIOGRAPHY;  Surgeon: Jolaine Artist, MD;  Location: [place] [facility];  Service: Cardiovascular;  Laterality: N/A;    Current Medications: Current Meds  Medication Sig   aspirin 81 MG chewable tablet Chew 1 tablet (81 mg total) by mouth daily.   carvedilol (COREG) 6.25 MG tablet Take 6.25 mg by mouth 2 (two) times daily with a meal.   dapagliflozin propanediol (FARXIGA) 10 MG TABS tablet Take 1 tablet (10 mg total) by mouth daily before breakfast.    furosemide (LASIX) 40 MG tablet Take 1 tablet (40 mg total) by mouth daily.   potassium chloride SA (KLOR-CON) 20 MEQ tablet Take 2 tablets (40 mEq total) by mouth daily.   sacubitril-valsartan (ENTRESTO) 24-26 MG Take 1 tablet by mouth 2 (two) times daily.   spironolactone (ALDACTONE) 25 MG tablet Take 0.5 tablets (12.5 mg total) by mouth daily.     Allergies:   Doxycycline, Amoxicillin-pot clavulanate, and Lisinopril   Social History   Socioeconomic History   Marital status: Single    Spouse name: Not on file   Number of children: Not on file   Years of education: Not on file   Highest education level: Not on file  Occupational History   Not on file  Tobacco Use   Smoking status: Former    Types: Cigarettes   Smokeless tobacco: Never  Vaping Use   Vaping Use: Never used  Substance and Sexual Activity   Alcohol use: Never   Drug use: Never   Sexual activity: Not on file  Other Topics Concern   Not on file  Social History Narrative   Not on file   Social Determinants of Health   Financial Resource Strain: Low Risk    Difficulty of Paying Living Expenses: Not very hard  Food Insecurity: No Food Insecurity   Worried About Running Out of Food in the Last Year: Never true   [place] in the Last Year: Never true  Transportation Needs: No Transportation Needs   Lack of Transportation (Medical): No   Lack of Transportation (Non-Medical): No  Physical Activity: Not on file  Stress: Not on file  Social Connections: Not on file     Family History: The patient's family history is negative for Heart failure, Heart disease, and Heart attack.  ROS:   Please see the history of present illness.     All other systems reviewed and are negative.  EKGs/Labs/Other Studies Reviewed:    The following studies were reviewed today:  Echo 10/04/2020  1. Left ventricular ejection fraction, by estimation, is 25 to 30%. Left  ventricular ejection fraction by 3D volume is 28 %.  The left ventricle has  severely decreased function. The left ventricle demonstrates global  hypokinesis. There is moderate  concentric left ventricular hypertrophy. Indeterminate diastolic filling  due to E-A fusion. The average left ventricular global longitudinal strain  is -10.3 %. The global longitudinal strain is abnormal.   2. Right ventricular systolic function is mildly reduced. The right  ventricular size is normal.   3. The mitral valve is normal in structure. There is mild-to-moderate  posterolaterally directed mitral regurgitation based on quantitative  assessment with EROA 0.15; RVol 27. Based on visual assesment, appears  more moderate. Mechanism appears  functional.   4. The aortic valve is tricuspid. There is mild calcification of the  aortic valve. There is mild thickening of the aortic valve. Aortic valve  regurgitation is not visualized. Mild aortic valve sclerosis is  present,  with no evidence of aortic valve  stenosis.   5. The inferior vena cava is dilated in size with <50% respiratory  variability, suggesting right atrial pressure of 15 mmHg.   Comparison(s): No prior Echocardiogram.    Cath 10/10/2020 Findings:   Ao = 110/67 (87) LV = 115/19 RA = 5 RV = 43/8 PA = 43/18 (27) PCW = 12 Fick cardiac output/index = 8.2/4.2 PVR = 1.8 WU FA sat = 99% PA sat = 73%, 75%   Assessment: 1. Normal cors 2. 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 Relatively well compensated hemodynamics with high cardiac output   Plan/Discussion:   Stop IV lasix. Switch to po. Continue to titrate GDMT. Likely can d/c soon.  EKG:  EKG is ordered today.  The ekg ordered today demonstrates sinus tachycardia, no significant fatigue or changes.  Recent Labs: 10/04/2020: TSH 2.281 10/10/2020: ALT 18; Hemoglobin 10.2; Magnesium 2.0; Platelets 259 11/12/2020: B Natriuretic Peptide 12.0; BUN 15; Creatinine, Ser 0.84; Potassium 3.1; Sodium 139  Recent Lipid Panel    Component Value Date/Time   CHOL 91  10/05/2020 0108   TRIG 38 10/05/2020 0108   HDL 33 (L) 10/05/2020 0108   CHOLHDL 2.8 10/05/2020 0108   VLDL 8 10/05/2020 0108   LDLCALC 50 10/05/2020 0108     Risk Assessment/Calculations:           Physical Exam:    VS:  BP 96/76   Pulse (!) 104   Ht 5\' 6"  (1.676 m)   Wt 182 lb (82.6 kg)   SpO2 100%   BMI 29.38 kg/m     Wt Readings from Last 3 Encounters:  11/26/20 182 lb (82.6 kg)  11/12/20 181 lb (82.1 kg)  10/28/20 182 lb (82.6 kg)     GEN:  Well nourished, well developed in no acute distress HEENT: Normal NECK: No JVD; No carotid bruits LYMPHATICS: No lymphadenopathy CARDIAC: RRR, no murmurs, rubs, gallops RESPIRATORY:  Clear to auscultation without rales, wheezing or rhonchi  ABDOMEN: Soft, non-tender, non-distended MUSCULOSKELETAL:  No edema; No deformity  SKIN: Warm and dry NEUROLOGIC:  Alert and oriented x 3 PSYCHIATRIC:  Normal affect   ASSESSMENT:    1. Sinus tachycardia   2. Chronic systolic HF (heart failure) ([place])   3. Primary hypertension   4. Controlled type 2 diabetes mellitus without complication, without long-term current use of insulin ([place])   5. Metastatic breast cancer ([place])    PLAN:    In order of problems listed above:  Sinus tachycardia: Heart rate borderline elevated.  Likely related to low ejection fraction  Chronic systolic heart failure: Normal coronary arteries on cardiac catheterization.  Continue current heart failure medication.  Blood pressure does not allow me to further uptitrate heart failure medication.  Obtain echocardiogram in October.  Hypertension: Blood pressure stable  DM2: Managed by primary care provider  Metastatic breast cancer: [facility].  She was already cleared for the procedure previously by heart failure service on 11/12/2020.        Medication Adjustments/Labs and Tests Ordered: Current medicines are reviewed at length with the patient today.  Concerns regarding medicines  are outlined above.  Orders Placed This Encounter  Procedures   EKG 12-Lead   ECHOCARDIOGRAM COMPLETE   No orders of the defined types were placed in this encounter.   Patient Instructions  Medication Instructions:  Your physician recommends that you continue on your current medications as directed. Please refer to the Current Medication list given  to you today.  *If you need a refill on your cardiac medications before your next appointment, please call your pharmacy*  Lab Work: NONE ordered at this time of appointment   If you have labs (blood work) drawn today and your tests are completely normal, you will receive your results only by: [place] (if you have MyChart) OR A paper copy in the mail If you have any lab test that is abnormal or we need to change your treatment, we will call you to review the results.  Testing/Procedures: Your physician has requested that you have an echocardiogram. Echocardiography is a painless test that uses sound waves to create images of your heart. It provides your doctor with information about the size and shape of your heart and how well your heart's chambers and valves are working. This procedure takes approximately one hour. There are no restrictions for this procedure.  Please schedule for October prior to 03/05/21 follow up with Dr. Sallyanne Kuster at [facility] office   Follow-Up: At [facility], you and your health needs are our priority.  As part of our continuing mission to provide you with exceptional heart care, we have created designated Provider Care Teams.  These Care Teams include your primary Cardiologist (physician) and Advanced Practice Providers (APPs -  Physician Assistants and Nurse Practitioners) who all work together to provide you with the care you need, when you need it.  We recommend signing up for the patient portal called "MyChart".  Sign up information is provided on this After Visit Summary.  MyChart is used to  connect with patients for Virtual Visits (Telemedicine).  Patients are able to view lab/test results, encounter notes, upcoming appointments, etc.  Non-urgent messages can be sent to your provider as well.   To learn more about what you can do with MyChart, go to [email or web address].    Your next appointment:   As schedule   The format for your next appointment:   In Person  Provider:   Sanda Klein, MD  Other Instructions    Signed, Marie Church, [place]  11/28/2020 11:48 PM    [place]

## 2020-11-26 NOTE — Progress Notes (Signed)
LCSW f/u with pt during her appointment today. Pt accompanied by her daughter who was busy on her phone during my visit. Pt had completed two lines on CAFA but unfortunately other information was missing. Pt daughter inquired about the purpose of the application. I went over the purpose of the [facility] Financial Aid program and referred them to the front page of the application packet- which has both the information about what the application is for and how to contact billing department if any additional questions about purpose. I went over what else was needed and went over the list of needed documents that we must have to submit [place]. Pt had not completed [employer] application. Pt hesistant to complete the 4506t for letter of nonfiling which we must have for both applications. Pt declined to sign today, I encouraged her and her daughter to reach out to billing department at [phone].   At this time I remain available to assist with information about how to complete applications but would need pt agreement to complete 4506t and additional requested paperwork.   Pt has been approved for [place] and Entresto PAP. I explained transportation services information and primary care services in [place]; pt declines at this time.   Westley Hummer, MSW, [place]  [phone]

## 2020-11-28 ENCOUNTER — Encounter: Payer: Self-pay | Admitting: Physician Assistant

## 2020-11-30 DIAGNOSIS — C50911 Malignant neoplasm of unspecified site of right female breast: Secondary | ICD-10-CM | POA: Diagnosis present

## 2020-11-30 HISTORY — PX: BREAST LUMPECTOMY: SHX2

## 2020-12-12 ENCOUNTER — Other Ambulatory Visit: Payer: Self-pay

## 2020-12-13 ENCOUNTER — Other Ambulatory Visit: Payer: Self-pay

## 2020-12-17 ENCOUNTER — Inpatient Hospital Stay (HOSPITAL_COMMUNITY)
Admission: RE | Admit: 2020-12-17 | Discharge: 2020-12-17 | Disposition: A | Discharge status: Home / Self Care | Payer: Self-pay | Source: Ambulatory Visit

## 2020-12-18 ENCOUNTER — Other Ambulatory Visit: Payer: Self-pay

## 2020-12-18 ENCOUNTER — Encounter (HOSPITAL_COMMUNITY): Payer: Self-pay | Admitting: Emergency Medicine

## 2020-12-18 ENCOUNTER — Emergency Department (HOSPITAL_COMMUNITY)
Admission: EM | Admit: 2020-12-18 | Discharge: 2020-12-19 | Disposition: A | Discharge status: Home / Self Care | Payer: Self-pay | Attending: Emergency Medicine | Admitting: Emergency Medicine

## 2020-12-18 ENCOUNTER — Emergency Department (HOSPITAL_COMMUNITY): Payer: Self-pay

## 2020-12-18 DIAGNOSIS — Z7982 Long term (current) use of aspirin: Secondary | ICD-10-CM | POA: Insufficient documentation

## 2020-12-18 DIAGNOSIS — Z853 Personal history of malignant neoplasm of breast: Secondary | ICD-10-CM | POA: Insufficient documentation

## 2020-12-18 DIAGNOSIS — Z79899 Other long term (current) drug therapy: Secondary | ICD-10-CM | POA: Insufficient documentation

## 2020-12-18 DIAGNOSIS — Z87891 Personal history of nicotine dependence: Secondary | ICD-10-CM | POA: Insufficient documentation

## 2020-12-18 DIAGNOSIS — E119 Type 2 diabetes mellitus without complications: Secondary | ICD-10-CM | POA: Insufficient documentation

## 2020-12-18 DIAGNOSIS — I110 Hypertensive heart disease with heart failure: Secondary | ICD-10-CM | POA: Insufficient documentation

## 2020-12-18 DIAGNOSIS — I5023 Acute on chronic systolic (congestive) heart failure: Secondary | ICD-10-CM | POA: Insufficient documentation

## 2020-12-18 DIAGNOSIS — E860 Dehydration: Secondary | ICD-10-CM | POA: Insufficient documentation

## 2020-12-18 DIAGNOSIS — R42 Dizziness and giddiness: Secondary | ICD-10-CM

## 2020-12-18 LAB — URINALYSIS, ROUTINE W REFLEX MICROSCOPIC
Bilirubin Urine: NEGATIVE
Glucose, UA: 150 mg/dL — AB
Hgb urine dipstick: NEGATIVE
Ketones, ur: NEGATIVE mg/dL
Leukocytes,Ua: NEGATIVE
Nitrite: NEGATIVE
Protein, ur: NEGATIVE mg/dL
Specific Gravity, Urine: 1.018 (ref 1.005–1.030)
pH: 5 (ref 5.0–8.0)

## 2020-12-18 LAB — COMPREHENSIVE METABOLIC PANEL
ALT: 16 U/L (ref 0–44)
AST: 32 U/L (ref 15–41)
Albumin: 3.6 g/dL (ref 3.5–5.0)
Alkaline Phosphatase: 47 U/L (ref 38–126)
Anion gap: 10 (ref 5–15)
BUN: 21 mg/dL (ref 8–23)
CO2: 25 mmol/L (ref 22–32)
Calcium: 9.6 mg/dL (ref 8.9–10.3)
Chloride: 97 mmol/L — ABNORMAL LOW (ref 98–111)
Creatinine, Ser: 1.23 mg/dL — ABNORMAL HIGH (ref 0.44–1.00)
GFR, Estimated: 49 mL/min — ABNORMAL LOW (ref >60)
Glucose, Bld: 178 mg/dL — ABNORMAL HIGH (ref 70–99)
Potassium: 4.2 mmol/L (ref 3.5–5.1)
Sodium: 132 mmol/L — ABNORMAL LOW (ref 135–145)
Total Bilirubin: 0.7 mg/dL (ref 0.3–1.2)
Total Protein: 7.9 g/dL (ref 6.5–8.1)

## 2020-12-18 LAB — CBC WITH DIFFERENTIAL/PLATELET
Abs Immature Granulocytes: 0.04 10*3/uL (ref 0.00–0.07)
Basophils Absolute: 0 10*3/uL (ref 0.0–0.1)
Basophils Relative: 0 %
Eosinophils Absolute: 0.1 10*3/uL (ref 0.0–0.5)
Eosinophils Relative: 2 %
HCT: 39.4 % (ref 36.0–46.0)
Hemoglobin: 12.1 g/dL (ref 12.0–15.0)
Immature Granulocytes: 1 %
Lymphocytes Relative: 45 %
Lymphs Abs: 2 10*3/uL (ref 0.7–4.0)
MCH: 26.5 pg (ref 26.0–34.0)
MCHC: 30.7 g/dL (ref 30.0–36.0)
MCV: 86.2 fL (ref 80.0–100.0)
Monocytes Absolute: 0.5 10*3/uL (ref 0.1–1.0)
Monocytes Relative: 10 %
Neutro Abs: 1.9 10*3/uL (ref 1.7–7.7)
Neutrophils Relative %: 42 %
Platelets: 228 10*3/uL (ref 150–400)
RBC: 4.57 MIL/uL (ref 3.87–5.11)
RDW: 16.6 % — ABNORMAL HIGH (ref 11.5–15.5)
WBC: 4.6 10*3/uL (ref 4.0–10.5)
nRBC: 0 % (ref 0.0–0.2)

## 2020-12-18 IMAGING — DX DG CHEST 1V PORT
1 series · 1 of 1 positions shown · non-contrast
Comparison: [DATE]

CLINICAL DATA: Dizziness

EXAM:
PORTABLE CHEST 1 VIEW

[chest ap]
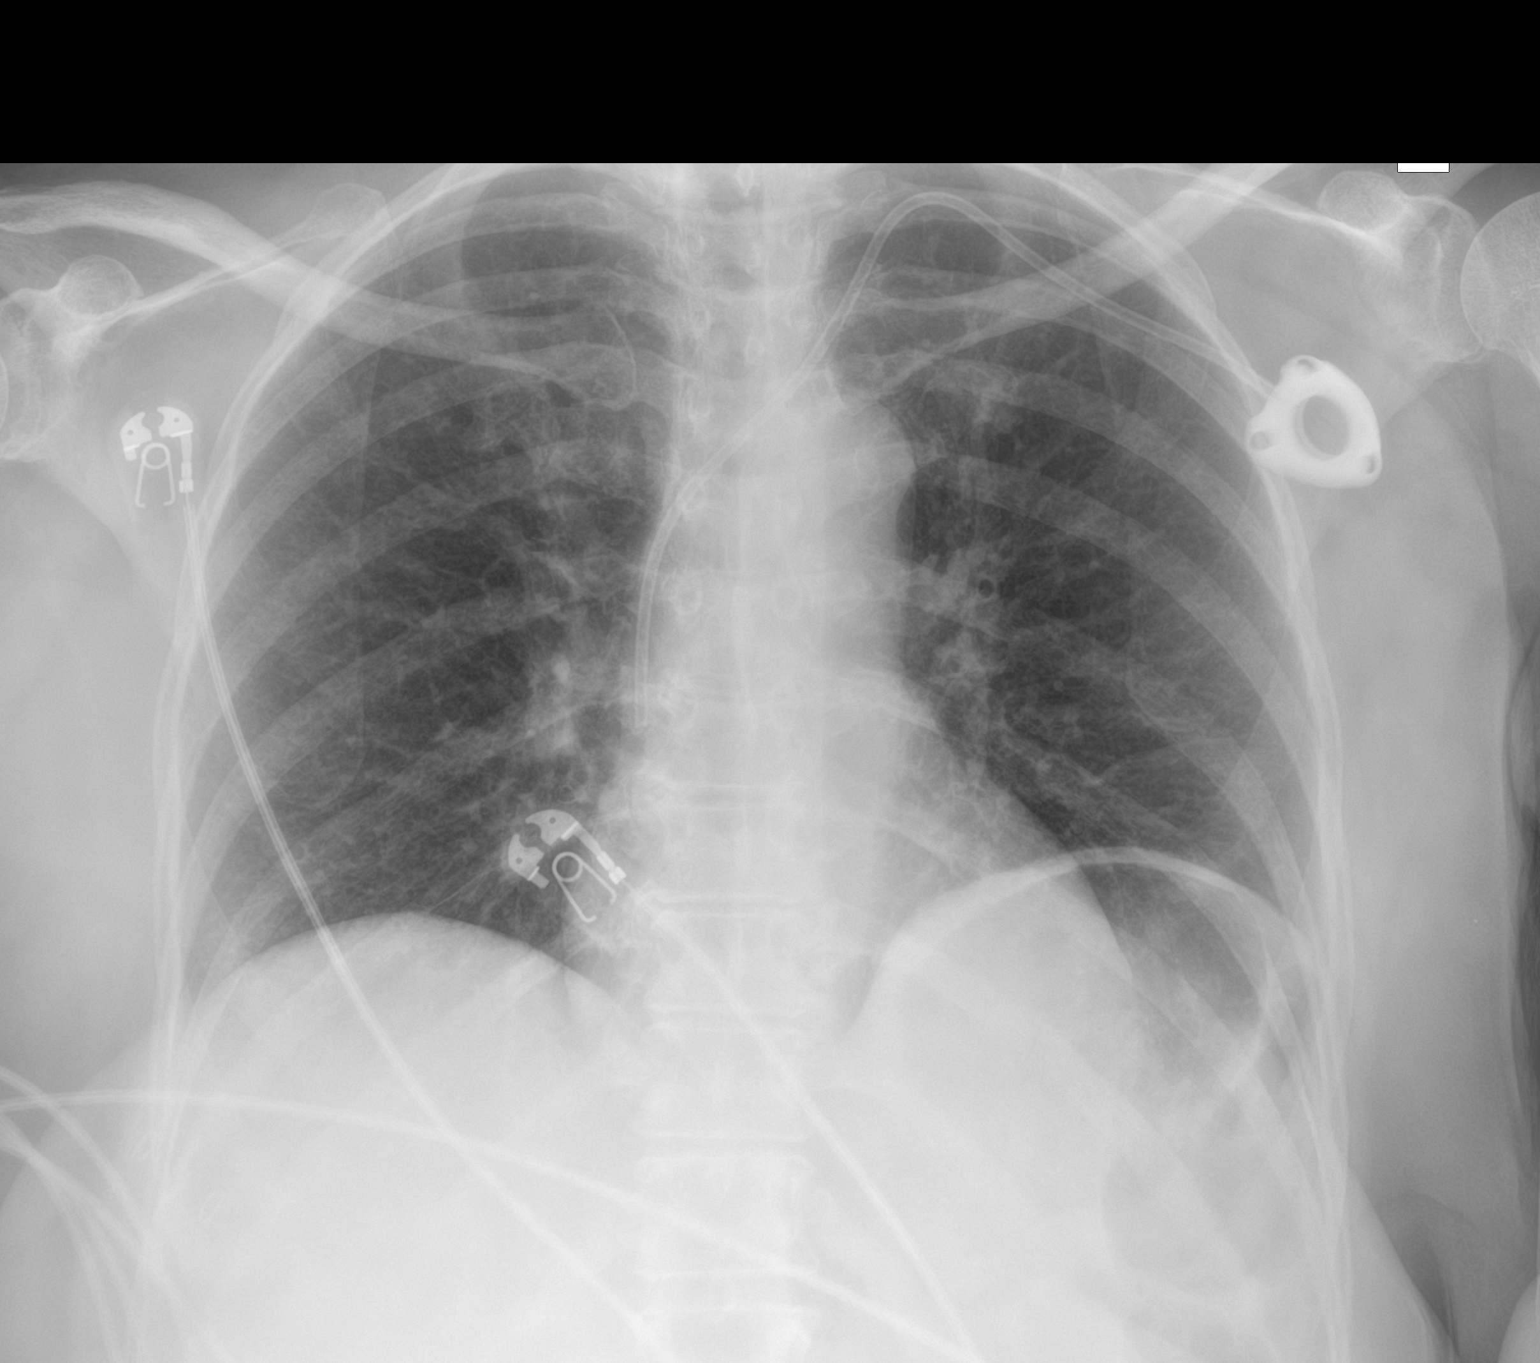

[1 of 1 positions shown; findings below may reference images not displayed]

FINDINGS: Left-sided central venous port tip over the SVC. No focal opacity or
pleural effusion. Normal cardiomediastinal silhouette. No
pneumothorax.
IMPRESSION: No active disease.

## 2020-12-18 MED ORDER — SODIUM CHLORIDE 0.9 % IV BOLUS
500.0000 mL | Freq: Once | INTRAVENOUS | Status: AC
  Administered 2020-12-18: 500 mL via INTRAVENOUS

## 2020-12-18 NOTE — Discharge Instructions (Addendum)
The testing today shows only some suggestion for mild dehydration.  Make sure you are eating 3 meals a day, and drinking plenty of fluids.  Take your regular medications.  Follow-up with your doctor, next week, for a checkup.  Return here, if needed

## 2020-12-18 NOTE — ED Provider Notes (Signed)
[facility] EMERGENCY DEPARTMENT Provider Note   CSN: [id number] Arrival date & time: 12/18/20  1731     History Chief Complaint  Patient presents with   Shortness of Breath   Dizziness   Chest Pain    Marie Church is a 65 y.o. female.  HPI She presents for evaluation of dizziness, Which she states has been present for 1 month.  She is currently being treated for recurrent cancer and had breast biopsy and lymph node dissection, about 2 weeks ago.  She reports that they plan on proceeding with radiation, but no additional chemotherapy at this time.  She has previously had chemotherapy that had been stopped because of congestive heart failure.  She states she sometimes has chest pain although not often.  She has not had trouble eating.  She feels like she is recovering well from her recent surgery.  There are no other known active modifying factors.     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   Hypertension     Patient Active Problem List   Diagnosis Date Noted   Acute on chronic respiratory failure with hypoxia ([place]) 10/08/2020   Acute on chronic systolic CHF (congestive heart failure) ([place]) 10/08/2020   Hypoxia 10/03/2020   Hypertensive urgency 10/03/2020   Shortness of breath 10/03/2020   Elevated troponin 10/03/2020   Severe protein-calorie malnutrition ([place]) 08/16/2020   Ulcer of right groin, limited to breakdown of skin ([place]) 08/08/2020   Anemia of chronic disease 07/31/2020   GERD without esophagitis 07/31/2020   Type 2 diabetes mellitus without complication, without long-term current use of insulin ([place]) 07/31/2020   Bilateral pneumonia 07/19/2020   Breast cancer ([place]) 04/03/2020   Malignant neoplasm of upper-outer quadrant of right breast in female, estrogen receptor negative ([place]) 03/18/2020   Sepsis ([place]) 03/14/2020    Axillary adenopathy 03/12/2020   Macrocytic anemia 02/29/2020   Physical debility 01/30/2020   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   Difficulty in walking 03/25/2018   Osteoarthritis of knee 03/25/2018   Arthritis 01/28/2018   Essential hypertension 01/28/2018   Bilateral knee pain 12/22/2011   Dizziness 12/22/2011   Eustachian tube dysfunction 12/22/2011   Otalgia 12/22/2011   Sinusitis 12/22/2011   Sore throat 12/22/2011    Past Surgical History:  Procedure Laterality Date   RIGHT/LEFT HEART CATH AND CORONARY ANGIOGRAPHY N/A 10/10/2020   Procedure: RIGHT/LEFT HEART CATH AND CORONARY ANGIOGRAPHY;  Surgeon: Jolaine Artist, MD;  Location: [facility];  Service: Cardiovascular;  Laterality: N/A;     OB History   No obstetric history on file.     Family History  Problem Relation Age of Onset   Heart failure Neg Hx    Heart disease Neg Hx    Heart attack Neg Hx     Social History   Tobacco Use   Smoking status: Former    Types: Cigarettes   Smokeless tobacco: Never  Vaping Use   Vaping Use: Never used  Substance Use Topics   Alcohol use: Never   Drug use: Never    Home Medications Prior to Admission medications   Medication Sig Start Date End Date Taking? Authorizing Provider  aspirin 81 MG chewable tablet Chew 1 tablet (81 mg total) by mouth daily. 11/07/20   Croitoru, Mihai, MD  carvedilol (COREG) 6.25 MG tablet Take 6.25 mg  by mouth 2 (two) times daily with a meal.    [provider]  dapagliflozin propanediol (FARXIGA) 10 MG TABS tablet Take 1 tablet (10 mg total) by mouth daily before breakfast. 11/07/20   Croitoru, Mihai, MD  furosemide (LASIX) 40 MG tablet Take 1 tablet (40 mg total) by mouth daily. 11/07/20   Croitoru, Mihai, MD  potassium chloride SA (KLOR-CON) 20 MEQ tablet Take 2 tablets (40 mEq total) by mouth daily. 11/14/20   [place], Maricela Bo, FNP  sacubitril-valsartan (ENTRESTO) 24-26 MG Take 1 tablet by mouth 2 (two) times daily.  10/28/20   Almyra Deforest, PA  spironolactone (ALDACTONE) 25 MG tablet Take 0.5 tablets (12.5 mg total) by mouth daily. 11/07/20   Croitoru, Mihai, MD    Allergies    Doxycycline, Amoxicillin-pot clavulanate, and Lisinopril  Review of Systems   Review of Systems  All other systems reviewed and are negative.  Physical Exam Updated Vital Signs BP 101/73   Pulse 96   Temp 99.1 F (37.3 C) (Oral)   Resp 19   SpO2 100%   Physical Exam Vitals and nursing note reviewed.  Constitutional:      General: She is not in acute distress.    Appearance: She is well-developed. She is not ill-appearing, toxic-appearing or diaphoretic.  HENT:     Head: Normocephalic and atraumatic.     Nose: No congestion or rhinorrhea.     Mouth/Throat:     Pharynx: No oropharyngeal exudate.  Eyes:     Extraocular Movements: Extraocular movements intact.     Conjunctiva/sclera: Conjunctivae normal.     Pupils: Pupils are equal, round, and reactive to light.  Neck:     Trachea: Phonation normal.  Cardiovascular:     Rate and Rhythm: Normal rate and regular rhythm.  Pulmonary:     Effort: Pulmonary effort is normal.     Breath sounds: Normal breath sounds.  Chest:     Chest wall: No tenderness.  Abdominal:     General: There is no distension.     Palpations: Abdomen is soft.     Tenderness: There is no abdominal tenderness. There is no guarding.  Musculoskeletal:        General: Normal range of motion.     Cervical back: Normal range of motion and neck supple.     Right lower leg: No edema.  Skin:    General: Skin is warm and dry.  Neurological:     Mental Status: She is alert and oriented to person, place, and time.     Motor: No abnormal muscle tone.     Comments: No dysarthria, aphasia or nystagmus.  Psychiatric:        Mood and Affect: Mood normal.        Behavior: Behavior normal.        Thought Content: Thought content normal.        Judgment: Judgment normal.    ED Results / Procedures /  Treatments   Labs (all labs ordered are listed, but only abnormal results are displayed) Labs Reviewed  COMPREHENSIVE METABOLIC PANEL - Abnormal; Notable for the following components:      Result Value   Sodium 132 (*)    Chloride 97 (*)    Glucose, Bld 178 (*)    Creatinine, Ser 1.23 (*)    GFR, Estimated 49 (*)    All other components within normal limits  CBC WITH DIFFERENTIAL/PLATELET - Abnormal; Notable for the following components:   RDW 16.6 (*)  All other components within normal limits  URINALYSIS, ROUTINE W REFLEX MICROSCOPIC - Abnormal; Notable for the following components:   APPearance HAZY (*)    Glucose, UA 150 (*)    All other components within normal limits  RESP PANEL BY RT-PCR (FLU A&B, COVID) [id number]    EKG EKG Interpretation  Date/Time:  Wednesday December 18 2020 17:52:41 EDT Ventricular Rate:  112 PR Interval:  134 QRS Duration: 74 QT Interval:  352 QTC Calculation: 480 R Axis:   42 Text Interpretation: Sinus tachycardia Minimal voltage criteria for LVH, may be normal variant ( Sokolow-Lyon ) Nonspecific T wave abnormality Abnormal ECG Confirmed by Orpah Greek [phone]) on 12/19/2020 11:36:48 AM   Date: 12/18/20  Rate: 112  Rhythm: sinus tachycardia  QRS Axis: normal  PR and QT Intervals: normal  ST/T Wave abnormalities: nonspecific T wave changes  PR and QRS Conduction Disutrbances:none  Narrative Interpretation:     Radiology DG Chest Port 1 View  Result Date: 12/18/2020 CLINICAL DATA:  Dizziness EXAM: PORTABLE CHEST 1 VIEW COMPARISON:  10/08/2020 FINDINGS: Left-sided central venous port tip over the SVC. No focal opacity or pleural effusion. Normal cardiomediastinal silhouette. No pneumothorax. IMPRESSION: No active disease. Electronically Signed   By: Donavan Foil M.D.   On: 12/18/2020 18:48    Procedures Procedures   Medications Ordered in ED Medications  sodium chloride 0.9 % bolus 500 mL (0 mLs Intravenous Stopped 12/18/20 1916)     ED Course  I have reviewed the triage vital signs and the nursing notes.  Pertinent labs & imaging results that were available during my care of the patient were reviewed by me and considered in my medical decision making (see chart for details).  Clinical Course as of 12/19/20 1158  Wed Dec 18, 2020  1758 Blood pressure 100/77 at this time [EW]    Clinical Course User Index [EW] Daleen Bo, MD   MDM Rules/Calculators/A&P                            Patient Vitals for the past 24 hrs:  BP Temp Temp src Pulse Resp SpO2  12/18/20 2200 101/73 -- -- -- 19 100 %  12/18/20 2145 112/79 -- -- -- 17 --  12/18/20 2130 117/83 -- -- 96 18 100 %  12/18/20 2124 -- -- -- -- 16 --  12/18/20 2115 106/72 -- -- (!) 103 (!) 22 97 %  12/18/20 2100 102/66 -- -- 98 19 98 %  12/18/20 2045 119/75 -- -- -- 17 --  12/18/20 2030 111/73 -- -- -- (!) 23 --  12/18/20 2015 106/64 -- -- -- (!) 21 96 %  12/18/20 2000 110/66 -- -- -- (!) 23 96 %  12/18/20 1945 111/73 -- -- 95 20 96 %  12/18/20 1930 122/69 -- -- -- (!) 22 100 %  12/18/20 1915 118/69 -- -- -- 18 --  12/18/20 1910 119/67 -- -- 89 18 100 %  12/18/20 1900 119/67 -- -- -- (!) 21 --  12/18/20 1845 94/72 -- -- -- 20 --  12/18/20 1830 100/60 -- -- 99 (!) 23 91 %  12/18/20 1815 106/65 -- -- 96 (!) 23 100 %  12/18/20 1800 100/77 -- -- 99 (!) 27 97 %  12/18/20 1752 (!) 76/52 -- -- (!) 113 17 98 %  12/18/20 1752 -- 99.1 F (37.3 C) Oral -- -- --    11:14 PM Reevaluation with update and discussion.  After initial assessment and treatment, an updated evaluation reveals she is comfortable at this time and has no further complaints.  Findings discussed and questions answered. Daleen Bo   Medical Decision Making:  This patient is presenting for evaluation of dizziness, which does require a range of treatment options, and is a complaint that involves a high risk of morbidity and mortality. The differential diagnoses include metabolic disorder,  infectious process, complications of cancer, cardiac disease, CNS abnormality. I decided to review old records, and in summary elderly female presenting with nonspecific dizziness, with history of breast cancer, dizziness, hypertension, physical debility, and diabetes..  I did not require additional historical information from anyone.  Clinical Laboratory Tests Ordered, included CBC, Metabolic panel, Urinalysis, and viral panel . Review indicates normal except sodium low, glucose high, chloride low, creatinine high, GFR low. Radiologic Tests Ordered, included chest x-ray.  I independently Visualized: Radiograph images, which show no infiltrate or edema  Cardiac Monitor Tracing which shows sinus tachycardia   Critical Interventions-clinical evaluation, laboratory testing, IV fluids, radiography, EKG, cardiac monitor, reassess  After These Interventions, the Patient was reevaluated and was found with mild metabolic abnormalities that are likely secondary to dehydration  CRITICAL CARE-no Performed by: Daleen Bo  Nursing Notes Reviewed/ Care Coordinated Applicable Imaging Reviewed Interpretation of Laboratory Data incorporated into ED treatment  The patient appears reasonably screened and/or stabilized for discharge and I doubt any other medical condition or other [facility] requiring further screening, evaluation, or treatment in the ED at this time prior to discharge.  Plan: Home Medications- Continue usual; Home Treatments-increase oral fluids; return here if the recommended treatment, does not improve the symptoms; Recommended follow up-PCP and oncology/hematology, as needed     Final Clinical Impression(s) / ED Diagnoses Final diagnoses:  Dizziness  Dehydration    Rx / DC Orders ED Discharge Orders     None        Daleen Bo, MD 12/19/20 1201

## 2020-12-18 NOTE — ED Triage Notes (Signed)
Pt here for shortness of breath x2-3 days, worsening dizziness. Pt states she feels like she is going to pass out. Pt states she has had intermittent cp since 1300 today.

## 2020-12-19 NOTE — Progress Notes (Signed)
PCP: none Primary Cardiologist: Dr. Sallyanne Kuster HF Cardiologist: Dr. Haroldine Laws  HPI:  65 yo female with PMH of HTN, T2DM, metastatic breast cancer currently on neoadjuvant chemotherapy, recently diagnosed with combined systolic CHF. She lives in [place], [place].   Indeterminate Intraductal papilloma diagnosed in 2019.  Pathology neg for malignancy in 2020.  First was diagnosed with breast cancer 02/2020. She was found to have metastatic disease to lymph nodes. A multimodal treatment approach was recommended including chemotherapy +/- radiation and likely surgery once tumor size improves. She was started on taxotere, carboplatin, herceptin, and perjeta q3 weeks x6 cycles. Echocardiogram 07/2020 for 3 month follow-up while on herceptin chemotherapy showed EF 62%, normal LV/RV function, mild MR, mild-mod TR, and mod pulmonary HTN.  Last dose of Herceptin was 09/27/2020. Her last ischemic evaluation appears to be a NST in 2010 which showed no evidence of ischemia. She had serial CT PE studies.  One on 07/20/2020 with no PE but bilateral ground glass and intralobular septal thickening and trace bilateral pleural effusions, subsegmental atelectasis.     She has been undergoing neoadjuvant chemotherapy since 03/2020 and has now completed 6 cycles of TCR-P as of 09/2020.   She was admitted on 10/03/20 with acute systolic CHF. Echocardiogram was obtained and showed EF 25-30%, moderate concentric LVH, global hypokinesis, mildly reduced RV systolic function, and mild-moderate MR, dilated IVC.  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  She was given IV furosemide 40 mg, then transitioned to oral furosemide 40 mg daily.  She was started on GDMT and sent home with 2L [place] supplemental oxygen.     Recently returned to HF Clinic for follow up on 11/12/20. Was overall feeling fine. Reported feeling dizzy a lot, regardless of activity. No falls. Denied increasing SOB, CP, edema, or PND/Orthopnea.  Appetite was poor. No fever or chills. Weight at home was 182 pounds. Reported taking all medications. She was ready for mastectomy and would like HF opinion before proceeding with surgery. She had repeat echo the preceding week in [place] and was told it was OK to proceed with surgery.  Today she returns to HF clinic for pharmacist medication titration. At last visit with HF APP and recent visit with Dr. Victorino December office,  no medication changes were made due to recent dizziness and low BP's. Potassium chloride 40 mEq daily was added for potassium of 3.1 on 11/12/20. R breast lumpectomy on 11/30/20. ED visit 12/18/20 for dizziness which was related to dehydration, treated with IV fluids. Overall she is feeling poorly today. Has noted increased dizziness which occurs all the time. Notes headaches and blurred vision with this dizziness. Also feeling more weak and fatigued. States the dizziness did improve for a few days after she received IV fluids in the ED and she has been trying to hydrate more. No increased SOB/DOE. She can walk from the clinic waiting area to the MD clinic before feeling tired and needing a rest. Does not check her weight at home. Weight down 2 lbs from last clinic visit. Trace LE edema, which is normal for her. No PND/orthopnea. Encouraged medication compliance, has missed multiple doses of potassium this week and has missed evening dose of carvedilol frequently.    HF Medications: Carvedilol 6.25 mg BID Entresto 24/26 mg BID Spironolactone 12.5 mg daily Farxiga 10 mg daily Furosemide 40 mg daily Potassium chloride 40 mEq daily  Has the patient been experiencing any side effects to the medications prescribed?  Yes - increased dizziness, headaches  and blurred vision.   Does the patient have any problems obtaining medications due to transportation or finances?   No insurance. Maryan Puls from [employer] and [place] from [employer].   Understanding of regimen: fair Understanding of  indications: fair Potential of compliance: poor Patient understands to avoid NSAIDs. Patient understands to avoid decongestants.    Pertinent Lab Values: 12/18/20: Serum creatinine 1.23, BUN 21, Potassium 4.2, Sodium 132  Vital Signs: Weight: 180.4 lbs (last clinic weight: 182 lbs) Blood pressure: 104/76  Heart rate: 89   Assessment/Plan: 1. 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 - 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 although she does have moderate concentric LVH and had uncontrolled HTN before first admission, - Cath w/ normal coronaries EF 25-30%. - NYHA II, trace LEE but this is normal for her. Likely dry given recent ED visit for dehydration, Scr bump and increased dizziness.  - Hold furosemide for 1 day then decrease furosemide to 20 mg daily. Repeat BMET in 2 weeks.  - Decrease carvedilol to 3.125 mg BID to help with dizziness/low BP. - Continue Entresto 24/26 mg BID. - Continue spironolactone 12.5 mg daily. - Continue Farxiga 10 mg daily. -Asked her to check BP at home and bring log to follow up in 2 weeks. Will reassess dizziness and volume status at that time.    2. Metastatic Breast Cancer: - Last dose of Herceptin was 09/27/2020 - [facility] Dr. [facility]    3. T2DM: - A1C 6.4 on 10/04/20 - Continue Farxiga.  Follow up in 2 weeks with APP for assessment of volume status and BP/dizziness. Repeat BMET at that visit.    Audry Riles, PharmD, BCPS, BCCP, CPP Heart Failure Clinic  Pharmacist [phone]

## 2020-12-19 NOTE — ED Notes (Signed)
Patient verbalizes understanding of discharge instructions. Opportunity for questioning and answers were provided. Armband removed by staff, pt discharged from ED ambulatory.   

## 2021-01-02 ENCOUNTER — Other Ambulatory Visit: Payer: Self-pay

## 2021-01-08 ENCOUNTER — Other Ambulatory Visit: Payer: Self-pay

## 2021-01-08 ENCOUNTER — Ambulatory Visit (HOSPITAL_COMMUNITY)
Admission: RE | Admit: 2021-01-08 | Discharge: 2021-01-08 | Disposition: A | Discharge status: Home / Self Care | Payer: Self-pay | Source: Ambulatory Visit | Attending: Cardiology | Admitting: Cardiology

## 2021-01-08 VITALS — BP 104/76 | HR 89 | Wt 180.4 lb

## 2021-01-08 DIAGNOSIS — C50919 Malignant neoplasm of unspecified site of unspecified female breast: Secondary | ICD-10-CM | POA: Insufficient documentation

## 2021-01-08 DIAGNOSIS — I272 Pulmonary hypertension, unspecified: Secondary | ICD-10-CM | POA: Insufficient documentation

## 2021-01-08 DIAGNOSIS — E119 Type 2 diabetes mellitus without complications: Secondary | ICD-10-CM | POA: Insufficient documentation

## 2021-01-08 DIAGNOSIS — I110 Hypertensive heart disease with heart failure: Secondary | ICD-10-CM | POA: Insufficient documentation

## 2021-01-08 DIAGNOSIS — Z7984 Long term (current) use of oral hypoglycemic drugs: Secondary | ICD-10-CM | POA: Insufficient documentation

## 2021-01-08 DIAGNOSIS — Z79899 Other long term (current) drug therapy: Secondary | ICD-10-CM | POA: Insufficient documentation

## 2021-01-08 DIAGNOSIS — I5022 Chronic systolic (congestive) heart failure: Secondary | ICD-10-CM | POA: Insufficient documentation

## 2021-01-08 DIAGNOSIS — I081 Rheumatic disorders of both mitral and tricuspid valves: Secondary | ICD-10-CM | POA: Insufficient documentation

## 2021-01-08 DIAGNOSIS — C799 Secondary malignant neoplasm of unspecified site: Secondary | ICD-10-CM | POA: Insufficient documentation

## 2021-01-08 MED ORDER — FUROSEMIDE 40 MG PO TABS
20.0000 mg | ORAL_TABLET | Freq: Every day | ORAL | 11 refills | Status: DC
  Filled 2021-01-08: qty 15, 30d supply, fill #0
  Filled 2021-02-18: qty 15, 30d supply, fill #1
  Filled 2021-06-17: qty 15, 30d supply, fill #0

## 2021-01-08 MED ORDER — CARVEDILOL 3.125 MG PO TABS
3.1250 mg | ORAL_TABLET | Freq: Two times a day (BID) | ORAL | 11 refills | Status: DC
  Filled 2021-01-08: qty 60, 30d supply, fill #0

## 2021-01-08 NOTE — Patient Instructions (Addendum)
It was a pleasure seeing you today!  MEDICATIONS: -We are changing your medications today -Hold furosemide for 1 day then Decrease furosemide to 20 mg (1/2 tablet) daily -Decrease carvedilol to 3.125 mg (1 tablet) twice daily. You may take 1/2 tablet of the 6.25 mg strength twice daily until you receive the new strength.  -Call if you have questions about your medications.   NEXT APPOINTMENT: Return to clinic in 2 weeks with APP Clinic.  In general, to take care of your heart failure: -Limit your fluid intake to 2 Liters (half-gallon) per day.   -Limit your salt intake to ideally 2-3 grams (2000-3000 mg) per day. -Weigh yourself daily and record, and bring that "weight diary" to your next appointment.  (Weight gain of 2-3 pounds in 1 day typically means fluid weight.) -The medications for your heart are to help your heart and help you live longer.   -Please contact us before stopping any of your heart medications.  Call the clinic at [phone] with questions or to reschedule future appointments.

## 2021-01-17 ENCOUNTER — Other Ambulatory Visit: Payer: Self-pay

## 2021-01-17 ENCOUNTER — Emergency Department (HOSPITAL_COMMUNITY)
Admission: EM | Admit: 2021-01-17 | Discharge: 2021-01-17 | Disposition: A | Discharge status: Home / Self Care | Payer: Self-pay | Attending: Emergency Medicine | Admitting: Emergency Medicine

## 2021-01-17 ENCOUNTER — Encounter (HOSPITAL_COMMUNITY): Payer: Self-pay | Admitting: *Deleted

## 2021-01-17 ENCOUNTER — Emergency Department (HOSPITAL_COMMUNITY): Payer: Self-pay

## 2021-01-17 DIAGNOSIS — I110 Hypertensive heart disease with heart failure: Secondary | ICD-10-CM | POA: Insufficient documentation

## 2021-01-17 DIAGNOSIS — E119 Type 2 diabetes mellitus without complications: Secondary | ICD-10-CM | POA: Insufficient documentation

## 2021-01-17 DIAGNOSIS — M79662 Pain in left lower leg: Secondary | ICD-10-CM | POA: Insufficient documentation

## 2021-01-17 DIAGNOSIS — I5023 Acute on chronic systolic (congestive) heart failure: Secondary | ICD-10-CM | POA: Insufficient documentation

## 2021-01-17 DIAGNOSIS — Z87891 Personal history of nicotine dependence: Secondary | ICD-10-CM | POA: Insufficient documentation

## 2021-01-17 DIAGNOSIS — Z853 Personal history of malignant neoplasm of breast: Secondary | ICD-10-CM | POA: Insufficient documentation

## 2021-01-17 DIAGNOSIS — Z7984 Long term (current) use of oral hypoglycemic drugs: Secondary | ICD-10-CM | POA: Insufficient documentation

## 2021-01-17 DIAGNOSIS — Z7982 Long term (current) use of aspirin: Secondary | ICD-10-CM | POA: Insufficient documentation

## 2021-01-17 DIAGNOSIS — M79661 Pain in right lower leg: Secondary | ICD-10-CM | POA: Insufficient documentation

## 2021-01-17 DIAGNOSIS — M79601 Pain in right arm: Secondary | ICD-10-CM | POA: Insufficient documentation

## 2021-01-17 DIAGNOSIS — Z79899 Other long term (current) drug therapy: Secondary | ICD-10-CM | POA: Insufficient documentation

## 2021-01-17 DIAGNOSIS — Z955 Presence of coronary angioplasty implant and graft: Secondary | ICD-10-CM | POA: Insufficient documentation

## 2021-01-17 LAB — CBC WITH DIFFERENTIAL/PLATELET
Abs Immature Granulocytes: 0 10*3/uL (ref 0.00–0.07)
Basophils Absolute: 0 10*3/uL (ref 0.0–0.1)
Basophils Relative: 0 %
Eosinophils Absolute: 0 10*3/uL (ref 0.0–0.5)
Eosinophils Relative: 1 %
HCT: 36.9 % (ref 36.0–46.0)
Hemoglobin: 11.2 g/dL — ABNORMAL LOW (ref 12.0–15.0)
Lymphocytes Relative: 33 %
Lymphs Abs: 1.3 10*3/uL (ref 0.7–4.0)
MCH: 26.2 pg (ref 26.0–34.0)
MCHC: 30.4 g/dL (ref 30.0–36.0)
MCV: 86.4 fL (ref 80.0–100.0)
Monocytes Absolute: 0.3 10*3/uL (ref 0.1–1.0)
Monocytes Relative: 7 %
Neutro Abs: 2.2 10*3/uL (ref 1.7–7.7)
Neutrophils Relative %: 59 %
Platelets: 226 10*3/uL (ref 150–400)
RBC: 4.27 MIL/uL (ref 3.87–5.11)
RDW: 18.2 % — ABNORMAL HIGH (ref 11.5–15.5)
WBC: 3.8 10*3/uL — ABNORMAL LOW (ref 4.0–10.5)
nRBC: 0 % (ref 0.0–0.2)
nRBC: 0 /100 WBC

## 2021-01-17 LAB — BASIC METABOLIC PANEL
Anion gap: 9 (ref 5–15)
BUN: 14 mg/dL (ref 8–23)
CO2: 26 mmol/L (ref 22–32)
Calcium: 9.7 mg/dL (ref 8.9–10.3)
Chloride: 101 mmol/L (ref 98–111)
Creatinine, Ser: 0.88 mg/dL (ref 0.44–1.00)
GFR, Estimated: >60 mL/min (ref >60)
Glucose, Bld: 122 mg/dL — ABNORMAL HIGH (ref 70–99)
Potassium: 4 mmol/L (ref 3.5–5.1)
Sodium: 136 mmol/L (ref 135–145)

## 2021-01-17 LAB — CK: Total CK: 54 U/L (ref 38–234)

## 2021-01-17 IMAGING — CR DG HUMERUS 2V *R*
2 series · 2 of 2 positions shown · non-contrast
Comparison: None.

CLINICAL DATA: Right arm and neck pain.

EXAM:
RIGHT HUMERUS - 2+ VIEW

[humerus ap]
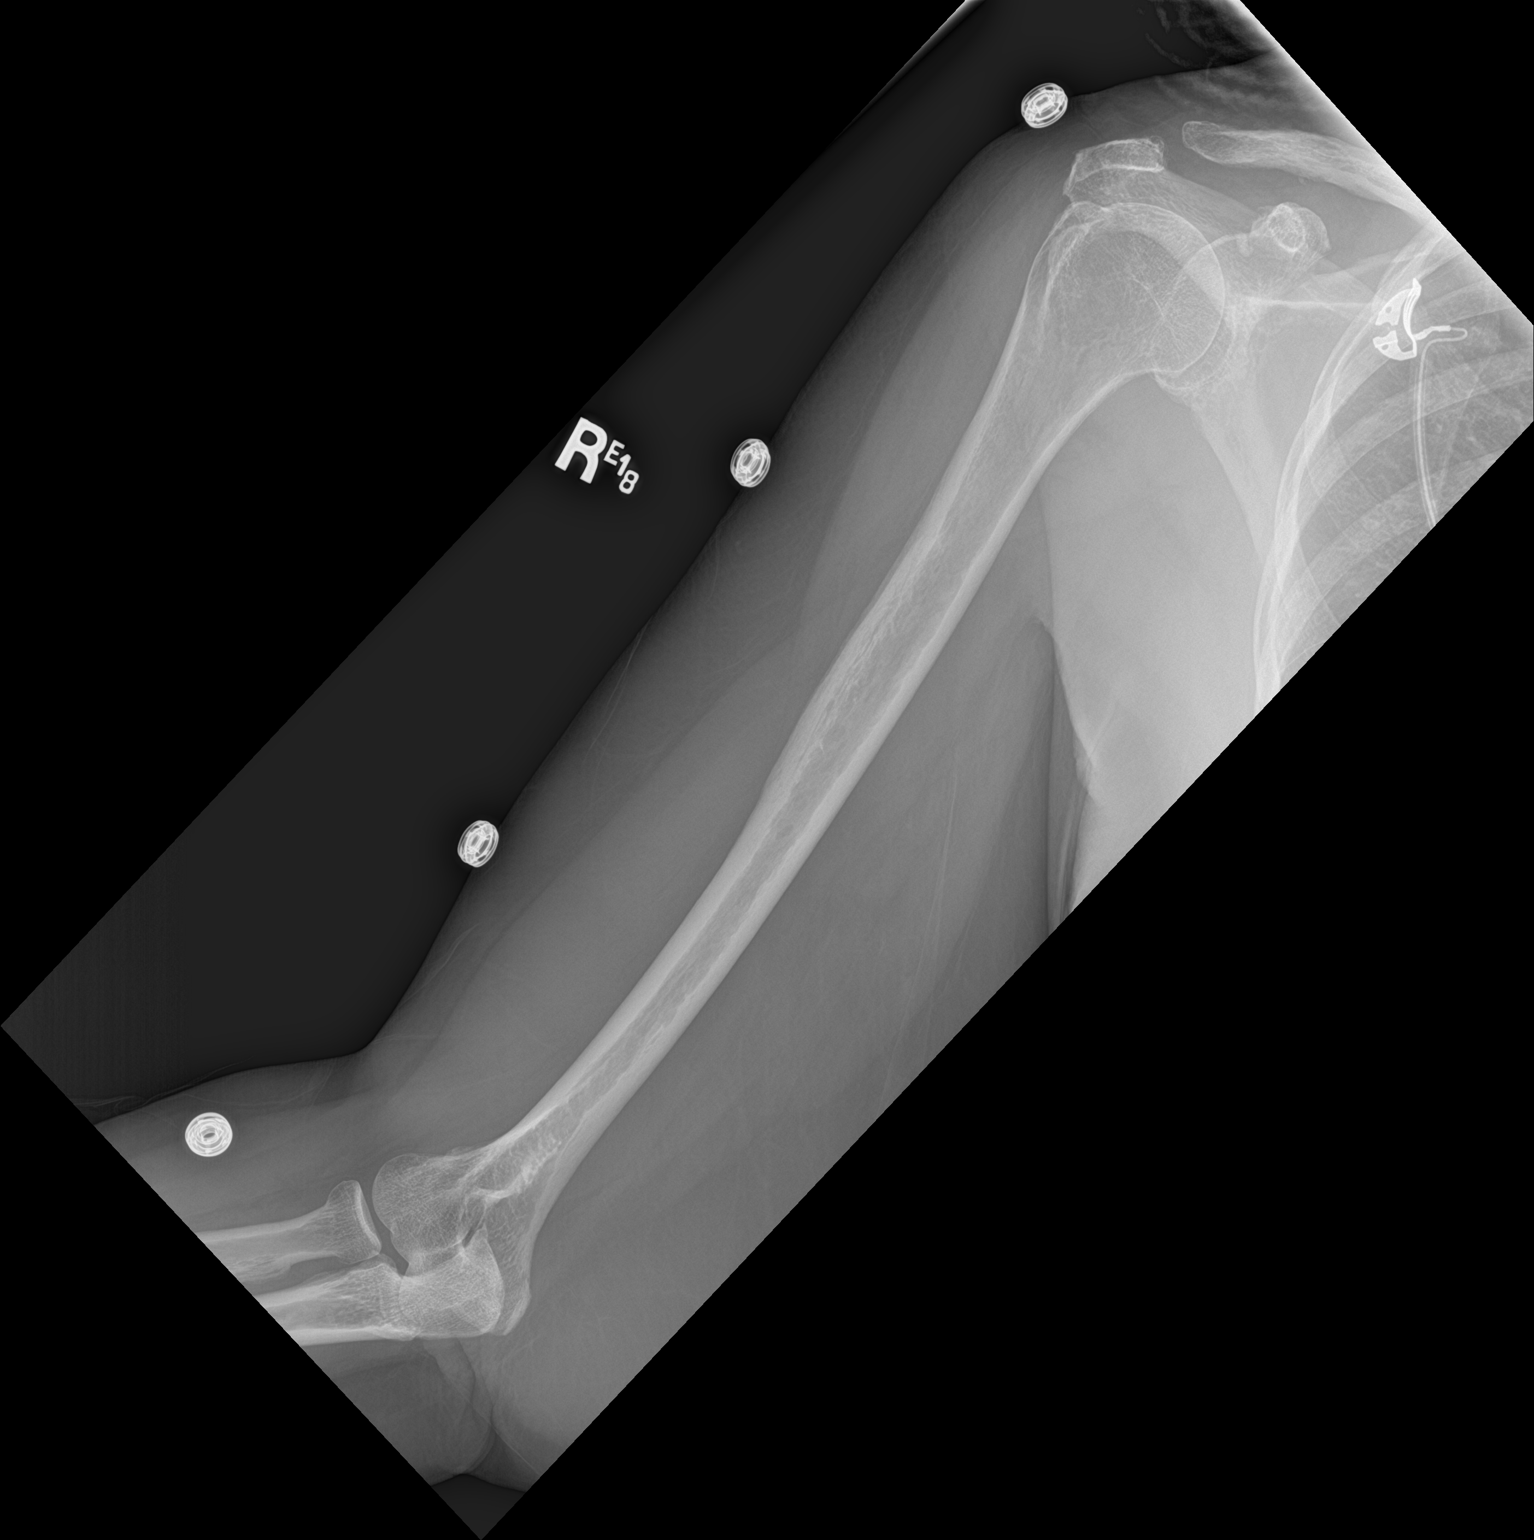

[humerus lat]
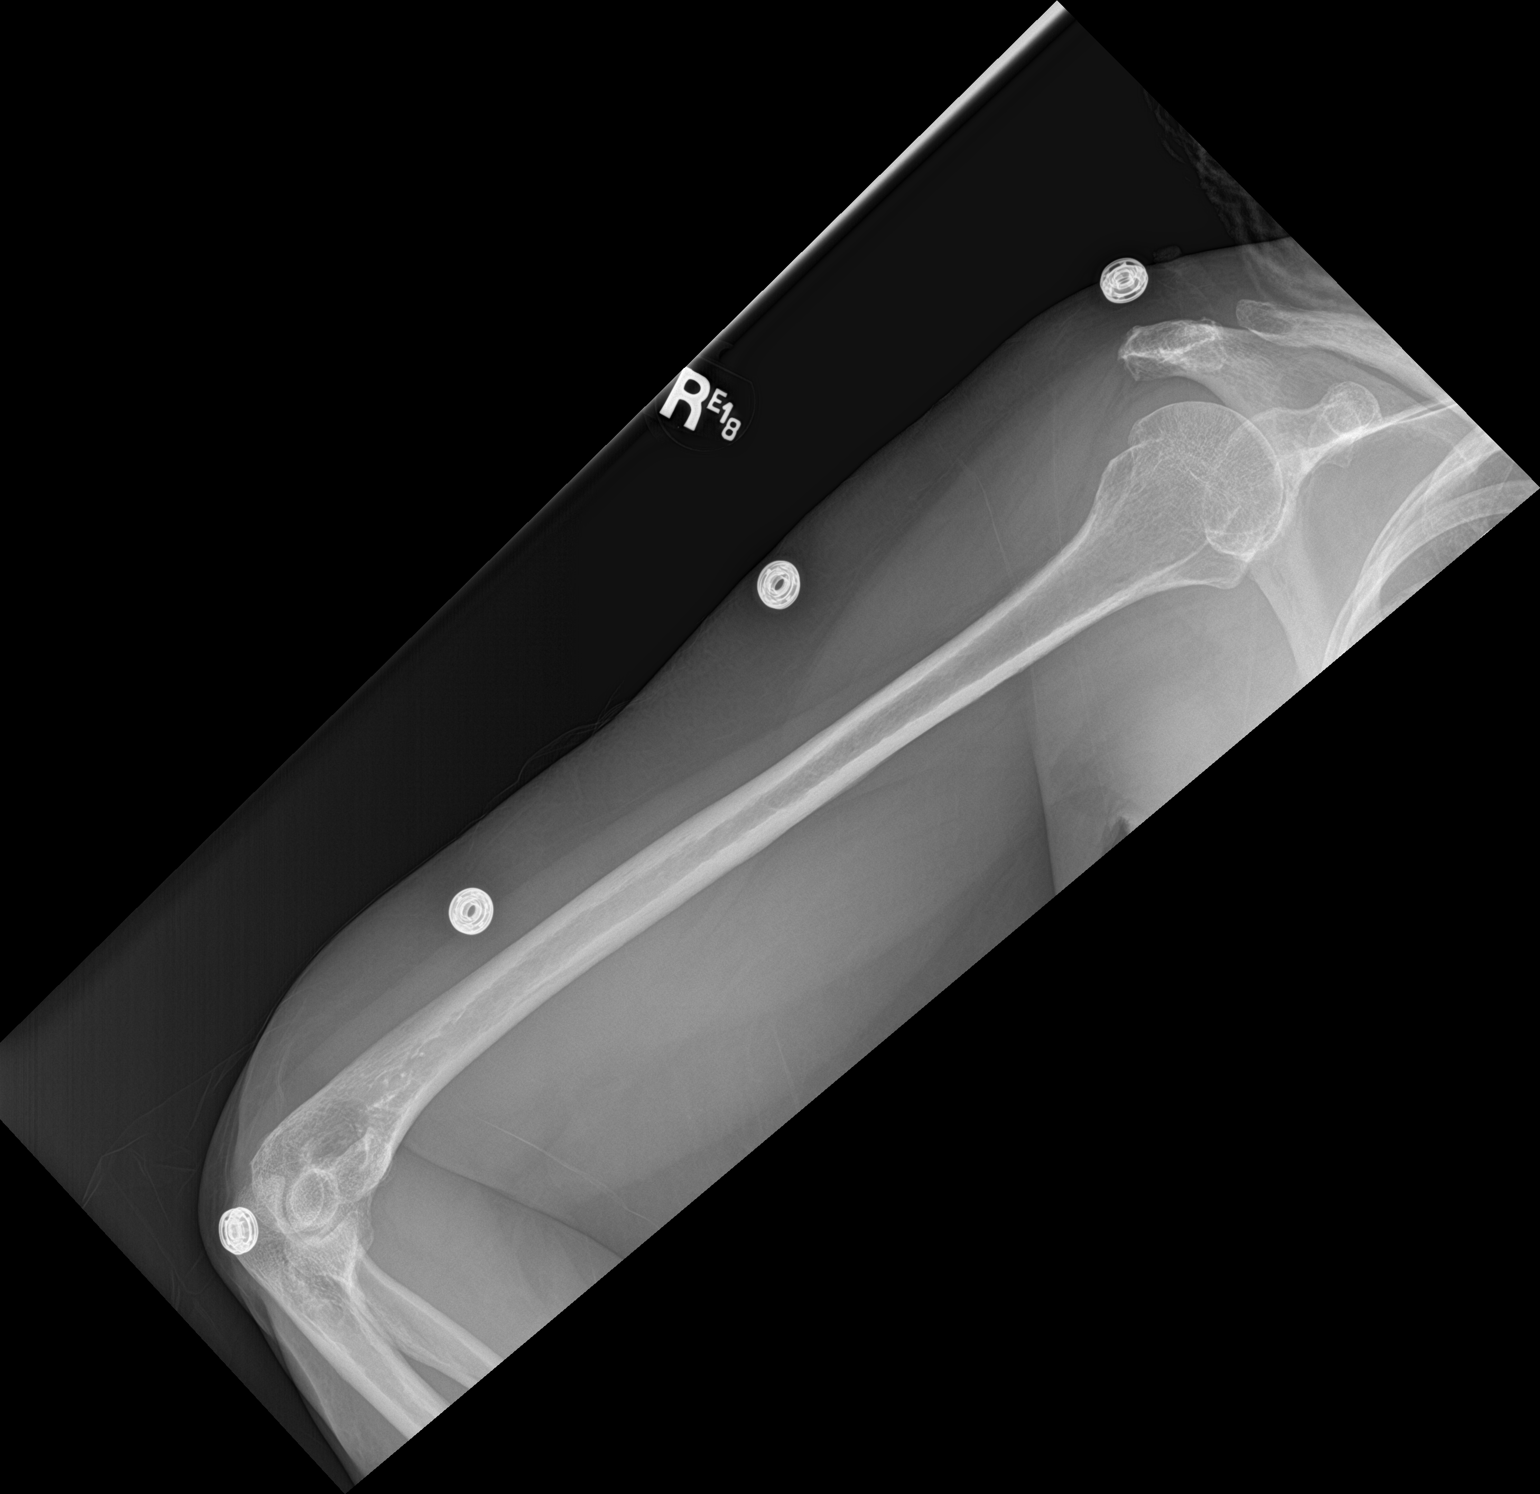

[2 of 2 positions shown; findings below may reference images not displayed]

FINDINGS: The mineralization and alignment are normal. There is no evidence of
acute fracture or dislocation. Mild degenerative changes at the
shoulder. The soft tissues appear unremarkable.
IMPRESSION: No acute osseous findings.

## 2021-01-17 IMAGING — CR DG CERVICAL SPINE COMPLETE 4+V
6 series · 6 of 6 positions shown · non-contrast
Comparison: None.

CLINICAL DATA: Right arm and neck pain.

EXAM:
CERVICAL SPINE - COMPLETE 4+ VIEW

[c-spine lat]
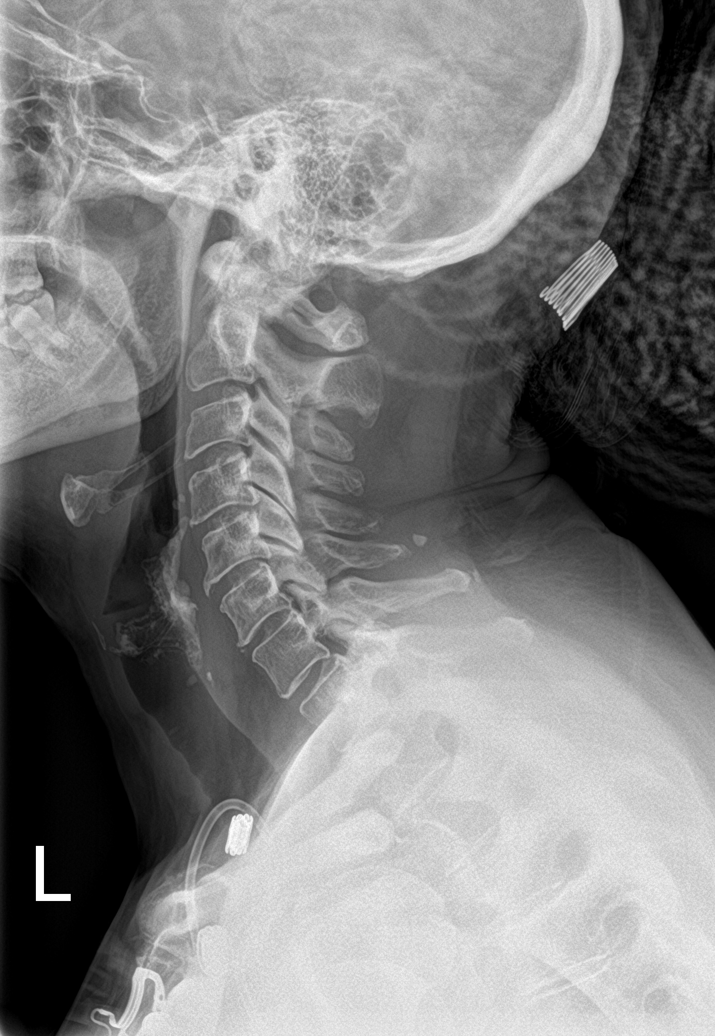

[c-spine obl (1 of 2)]
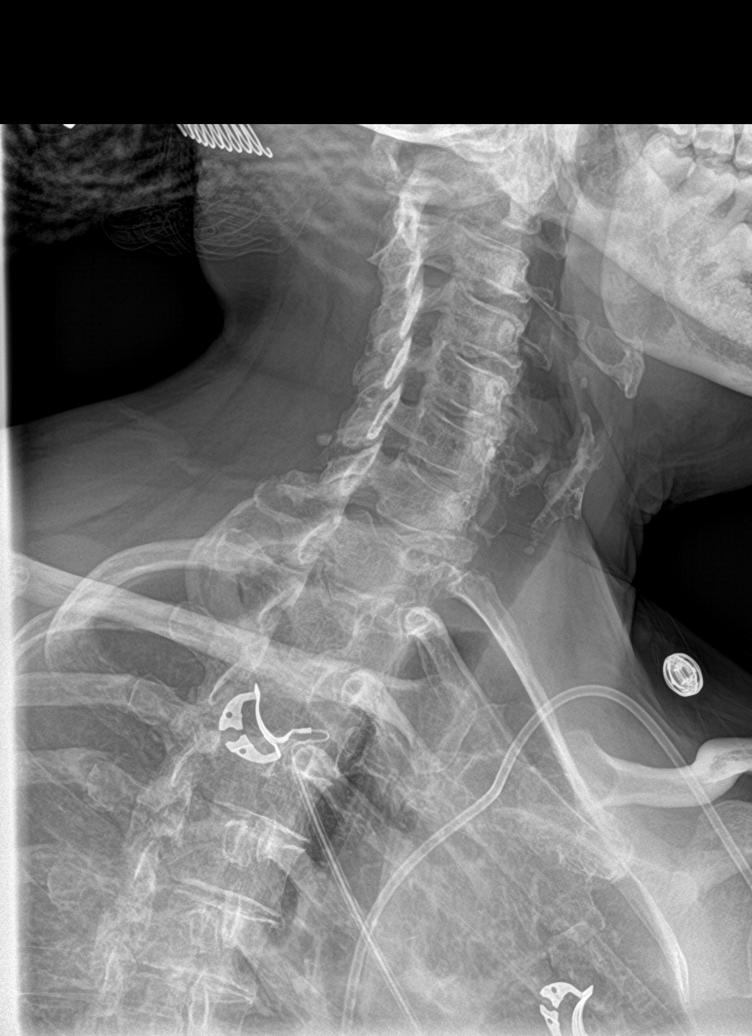

[c-spine obl (2 of 2)]
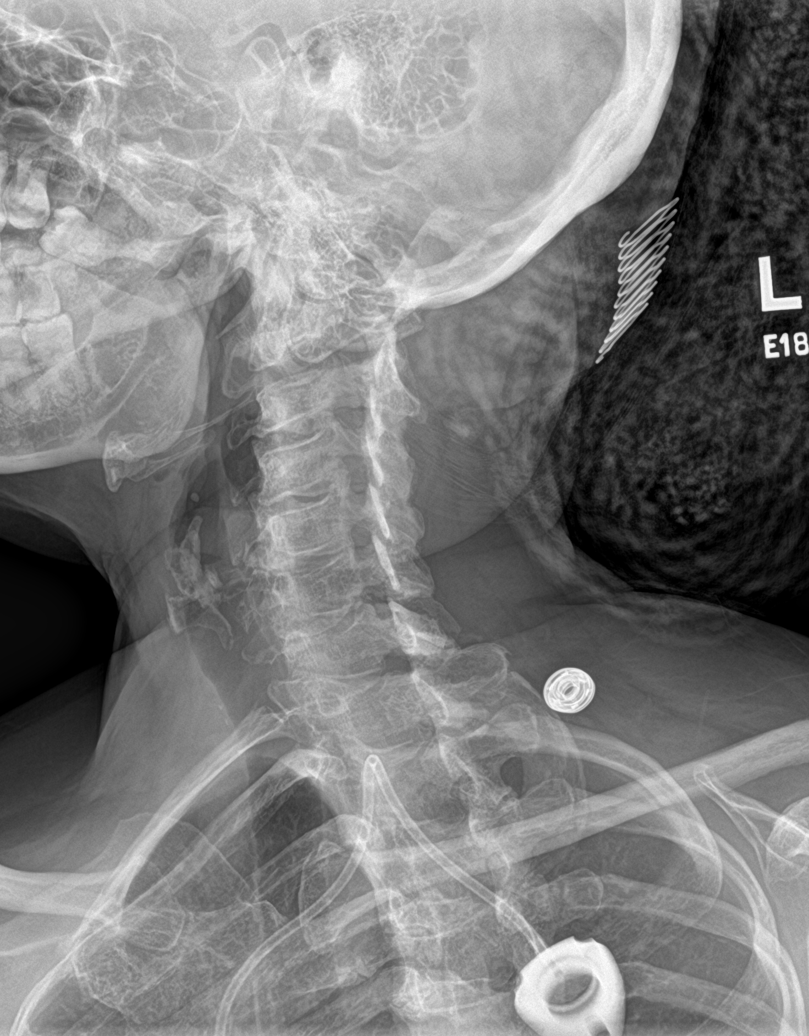

[c-spine ap]
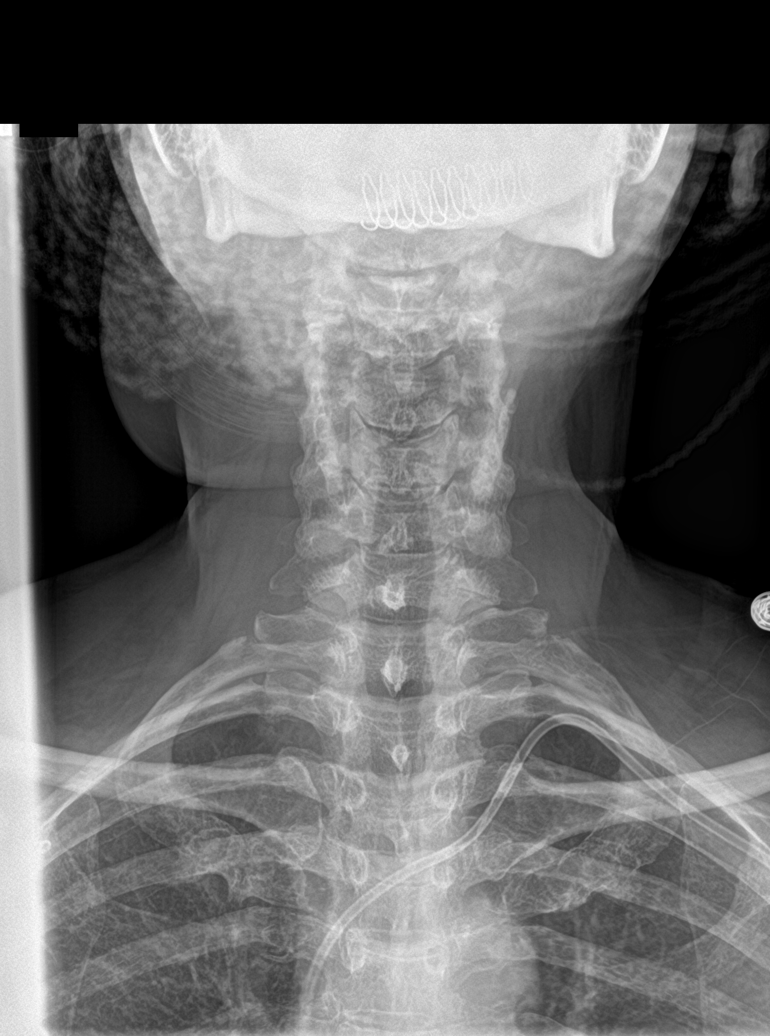

[c-spine swimmers]
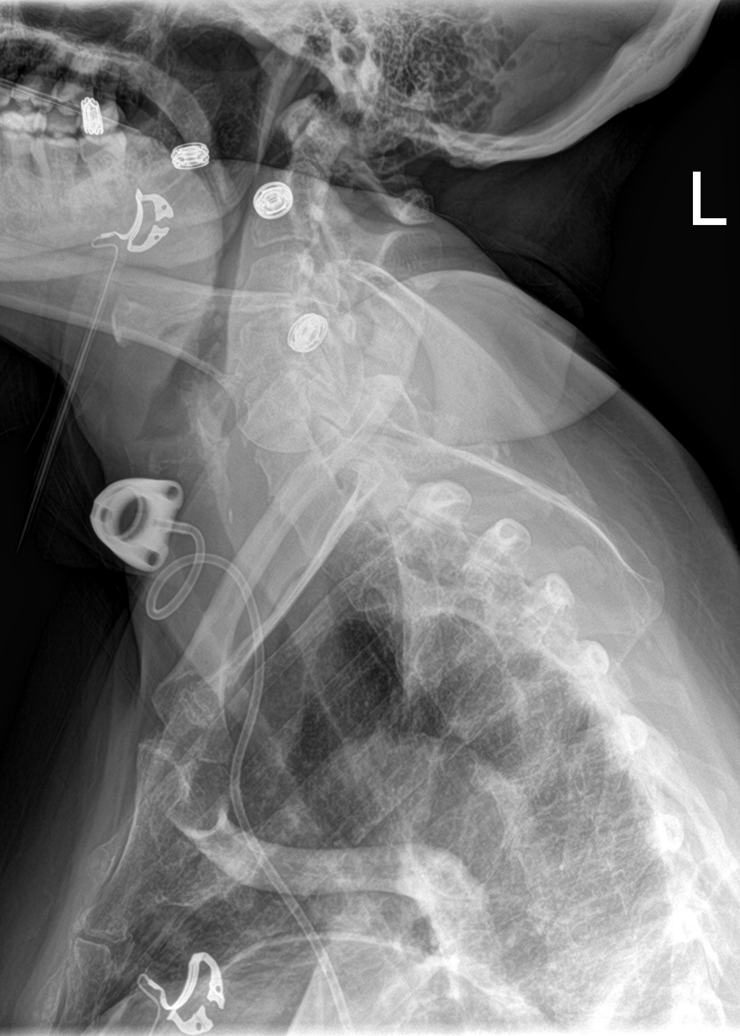

[[person_name]]
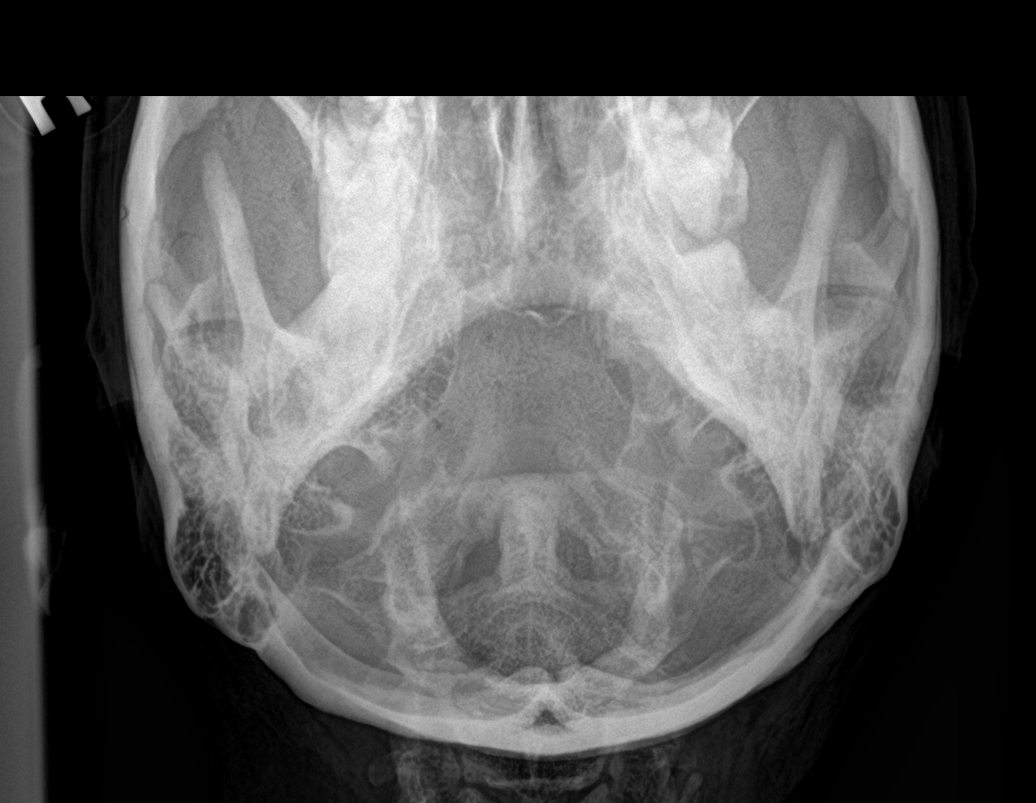

[6 of 6 positions shown; findings below may reference images not displayed]

FINDINGS: The prevertebral soft tissues are normal. The alignment is anatomic
through T1. There is no evidence of acute fracture or traumatic
subluxation. The C1-2 articulation appears normal in the AP
projection. There is mild multilevel spondylosis with uncinate
spurring and facet hypertrophy. No significant osseous foraminal
narrowing. There is ossification of the ligamentum nuchae.
Left-sided Port-A-Cath in place.
IMPRESSION: No acute cervical spine findings. Mild spondylosis without
high-grade foraminal narrowing.

## 2021-01-17 MED ORDER — LACTATED RINGERS IV BOLUS: 1000.0000 mL | Freq: Once | INTRAVENOUS | Status: DC

## 2021-01-17 MED ORDER — HYDROCODONE-ACETAMINOPHEN 5-325 MG PO TABS
1.0000 | ORAL_TABLET | Freq: Once | ORAL | Status: AC
  Administered 2021-01-17: 1 via ORAL
  Filled 2021-01-17: qty 1

## 2021-01-17 NOTE — ED Notes (Signed)
E-signature pad unavailable at time of pt discharge. This RN discussed discharge materials with pt and answered all pt questions. Pt stated understanding of discharge material. ? ?

## 2021-01-17 NOTE — ED Provider Notes (Signed)
[facility] EMERGENCY DEPARTMENT Provider Note   CSN: [id number] Arrival date & time: 01/17/21  1548     History Chief Complaint  Patient presents with   Arm Pain    Marie Church is a 65 y.o. female.  HPI 65 year old female presents with right arm pain.  This has been going on for a couple weeks.  No trauma.  She has had mastectomy and multiple lymph node removals in her right axilla, most recently last month.  However the pain is more in her right upper arm rather than her axilla as well as her trapezius and right neck.  The whole arm feels dull and pain, which is present at rest but worse with range of motion of her arm.  No weakness or numbness.  She is also been noticing some bilateral lower leg discomfort for about a week.  No leg swelling or weakness/numbness there either.  No fevers.  She is been taking Tylenol without relief.  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   Hypertension     Patient Active Problem List   Diagnosis Date Noted   Acute on chronic respiratory failure with hypoxia ([place]) 10/08/2020   Acute on chronic systolic CHF (congestive heart failure) ([place]) 10/08/2020   Hypoxia 10/03/2020   Hypertensive urgency 10/03/2020   Shortness of breath 10/03/2020   Elevated troponin 10/03/2020   Severe protein-calorie malnutrition ([place]) 08/16/2020   Ulcer of right groin, limited to breakdown of skin ([place]) 08/08/2020   Anemia of chronic disease 07/31/2020   GERD without esophagitis 07/31/2020   Type 2 diabetes mellitus without complication, without long-term current use of insulin ([name]) 07/31/2020   Bilateral pneumonia 07/19/2020   Breast cancer ([place]) 04/03/2020   Malignant neoplasm of upper-outer quadrant of right breast in female, estrogen receptor negative ([place]) 03/18/2020   Sepsis ([place]) 03/14/2020   Axillary adenopathy  03/12/2020   Macrocytic anemia 02/29/2020   Physical debility 01/30/2020   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   Difficulty in walking 03/25/2018   Osteoarthritis of knee 03/25/2018   Arthritis 01/28/2018   Essential hypertension 01/28/2018   Bilateral knee pain 12/22/2011   Dizziness 12/22/2011   Eustachian tube dysfunction 12/22/2011   Otalgia 12/22/2011   Sinusitis 12/22/2011   Sore throat 12/22/2011    Past Surgical History:  Procedure Laterality Date   RIGHT/LEFT HEART CATH AND CORONARY ANGIOGRAPHY N/A 10/10/2020   Procedure: RIGHT/LEFT HEART CATH AND CORONARY ANGIOGRAPHY;  Surgeon: Jolaine Artist, MD;  Location: [facility];  Service: Cardiovascular;  Laterality: N/A;     OB History   No obstetric history on file.     Family History  Problem Relation Age of Onset   Heart failure Neg Hx    Heart disease Neg Hx    Heart attack Neg Hx     Social History   Tobacco Use   Smoking status: Former    Types: Cigarettes   Smokeless tobacco: Never  Vaping Use   Vaping Use: Never used  Substance Use Topics   Alcohol use: Never   Drug use: Never    Home Medications Prior to Admission medications   Medication Sig Start Date End Date Taking? Authorizing Provider  Dexlansoprazole (DEXILANT) 30 MG capsule Take 30 mg by mouth daily.   Yes [provider]  aspirin 81 MG chewable tablet Chew 1  tablet (81 mg total) by mouth daily. Patient not taking: Reported on 01/08/2021 11/07/20   Croitoru, Dani Gobble, MD  carvedilol (COREG) 3.125 MG tablet Take 1 tablet (3.125 mg total) by mouth 2 (two) times daily with a meal. 01/08/21   Larey Dresser, MD  dapagliflozin propanediol (FARXIGA) 10 MG TABS tablet Take 1 tablet (10 mg total) by mouth daily before breakfast. 11/07/20   Croitoru, Mihai, MD  furosemide (LASIX) 40 MG tablet Take 0.5 tablets (20 mg total) by mouth daily. 01/08/21   Larey Dresser, MD  potassium chloride SA (KLOR-CON) 20 MEQ tablet Take 2 tablets (40 mEq  total) by mouth daily. 11/14/20   [place], Maricela Bo, FNP  sacubitril-valsartan (ENTRESTO) 24-26 MG Take 1 tablet by mouth 2 (two) times daily. 10/28/20   Almyra Deforest, PA  spironolactone (ALDACTONE) 25 MG tablet Take 0.5 tablets (12.5 mg total) by mouth daily. 11/07/20   Croitoru, Mihai, MD    Allergies    Doxycycline, Amoxicillin-pot clavulanate, and Lisinopril  Review of Systems   Review of Systems  Constitutional:  Negative for fever.  Respiratory:  Negative for shortness of breath.   Cardiovascular:  Negative for chest pain.  Musculoskeletal:  Positive for arthralgias and myalgias.  Neurological:  Negative for weakness and numbness.  All other systems reviewed and are negative.  Physical Exam Updated Vital Signs BP 138/79   Pulse 84   Temp 98.7 F (37.1 C) (Oral)   Resp 17   Ht '5\' 6"'$  (1.676 m)   Wt 81.8 kg   SpO2 99%   BMI 29.11 kg/m   Physical Exam Vitals and nursing note reviewed.  Constitutional:      Appearance: She is well-developed.  HENT:     Head: Normocephalic and atraumatic.     Right Ear: External ear normal.     Left Ear: External ear normal.     Nose: Nose normal.  Eyes:     General:        Right eye: No discharge.        Left eye: No discharge.  Neck:   Cardiovascular:     Rate and Rhythm: Normal rate and regular rhythm.     Pulses:          Radial pulses are 2+ on the right side.     Heart sounds: Normal heart sounds.  Pulmonary:     Effort: Pulmonary effort is normal.     Breath sounds: Normal breath sounds.  Abdominal:     Palpations: Abdomen is soft.     Tenderness: There is no abdominal tenderness.  Musculoskeletal:     Right shoulder: No tenderness. Normal range of motion.     Right upper arm: Tenderness present.       Arms:     Comments: Patient reports some tenderness to her bilateral calves upon palpation.  However when she is distracted and talking on something else I repalpated these areas and there is no reproducible  tenderness. Grossly normal sensation in all 4 extremities.  Normal strength.  Skin:    General: Skin is warm and dry.  Neurological:     Mental Status: She is alert.  Psychiatric:        Mood and Affect: Mood is not anxious.    ED Results / Procedures / Treatments   Labs (all labs ordered are listed, but only abnormal results are displayed) Labs Reviewed  CBC WITH DIFFERENTIAL/PLATELET - Abnormal; Notable for the following components:  Result Value   WBC 3.8 (*)    Hemoglobin 11.2 (*)    RDW 18.2 (*)    All other components within normal limits  BASIC METABOLIC PANEL - Abnormal; Notable for the following components:   Glucose, Bld 122 (*)    All other components within normal limits  CK    EKG None  Radiology DG Cervical Spine Complete  Result Date: 01/17/2021 CLINICAL DATA:  Right arm and neck pain. EXAM: CERVICAL SPINE - COMPLETE 4+ VIEW COMPARISON:  None. FINDINGS: The prevertebral soft tissues are normal. The alignment is anatomic through T1. There is no evidence of acute fracture or traumatic subluxation. The C1-2 articulation appears normal in the AP projection. There is mild multilevel spondylosis with uncinate spurring and facet hypertrophy. No significant osseous foraminal narrowing. 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 Left-sided Port-A-Cath in place. IMPRESSION: No acute cervical spine findings. Mild spondylosis without high-grade foraminal narrowing. Electronically Signed   By: Richardean Sale M.D.   On: 01/17/2021 19:36   DG Humerus Right  Result Date: 01/17/2021 CLINICAL DATA:  Right arm and neck pain. EXAM: RIGHT HUMERUS - 2+ VIEW COMPARISON:  None. FINDINGS: The mineralization and alignment are normal. There is no evidence of acute fracture or dislocation. Mild degenerative changes at the shoulder. The soft tissues appear unremarkable. IMPRESSION: No acute osseous findings. Electronically Signed   By: Richardean Sale M.D.   On: 01/17/2021 19:37     Procedures Procedures   Medications Ordered in ED Medications  HYDROcodone-acetaminophen (NORCO/VICODIN) 5-325 MG per tablet 1 tablet (1 tablet Oral Given 01/17/21 1816)    ED Course  I have reviewed the triage vital signs and the nursing notes.  Pertinent labs & imaging results that were available during my care of the patient were reviewed by me and considered in my medical decision making (see chart for details).    MDM Rules/Calculators/A&P                           Patient is fairly tender on her proximal upper arm but her x-rays are unremarkable.  No swelling and so I think DVT is unlikely.  Could be cervical radiculopathy.  For now we will recommend Tylenol and NSAIDs and have her follow-up with her PCP and/or oncologist.  X-rays do not show an obvious mass/metastasis.  No focal neurodeficits to suggest needing an emergent MRI.  Doubt infection.  No chest pain.  At this point she appears stable for discharge home. Final Clinical Impression(s) / ED Diagnoses Final diagnoses:  Right arm pain    Rx / DC Orders ED Discharge Orders     None        Sherwood Gambler, MD 01/17/21 2008

## 2021-01-17 NOTE — ED Notes (Signed)
Pt's surgical incisions are well approximated and look to be healed. Pt states she doesn't have any feeling in her right upper posterior arm and under arm area. Pt has 2+ right radial pulse, Pt has decreased ROM of right shoulder due to pain. Pt has 4/5 right grip strength, but states it hurt when she squeezed my fingers. Pt has less than 3 sec cap refill.

## 2021-01-17 NOTE — ED Provider Notes (Signed)
Emergency Medicine Provider Triage Evaluation Note  Marie Church , a 65 y.o. female  was evaluated in triage.  Pt complains of right shoulder and arm pain. Patient had lymph node removal in right axilla x 1 month ago, and an additional biopsy 2 weeks ago. She did not return for wound check. No falls or trauma. Pain is worse in her right shoulder radiating down to her fingertips.   Review of Systems  Positive: Right arm pain Negative: Chest pain, SOB, rash, fevers, chills, nausea, vomiting  Physical Exam  BP 100/76 (BP Location: Left Arm)   Pulse 94   Temp 98.7 F (37.1 C) (Oral)   Resp 16   Ht '5\' 6"'$  (1.676 m)   Wt 81.8 kg   SpO2 99%   BMI 29.11 kg/m  Gen:   Awake, no distress   Resp:  Normal effort  MSK:   Decreased passive ROM of right shoulder to flexion past 90 degrees due to pain  Other:  Surgical scar on right breast healing well without erythema or signs of infection, no rashes or streaking  Medical Decision Making  Medically screening exam initiated at 4:38 PM.  Appropriate orders placed.  Zaviera Siddiqui was informed that the remainder of the evaluation will be completed by another provider, this initial triage assessment does not replace that evaluation, and the importance of remaining in the ED until their evaluation is complete.     Estill Cotta 01/17/21 1646    Sherwood Gambler, MD 01/19/21 [phone]

## 2021-01-17 NOTE — ED Triage Notes (Signed)
The pt has had under her rt arm for one month following a lymph node  removal.  She also had a surigal procedure 2 weeks ago she did not return for a wound check as she was instructed to do  she reports that she was told to come to the ed today.

## 2021-01-17 NOTE — Discharge Instructions (Addendum)
You will need to take Tylenol and ibuprofen/Advil for your discomfort.  Follow-up closely with your primary care physician.  You may need further outpatient imaging if your symptoms do not improve.    If you develop weakness or numbness in your arms or legs, fevers, new or worsening pain, incontinence of her bowels or bladder, or any other new/concerning symptoms then return to the ER for evaluation.

## 2021-01-17 NOTE — ED Notes (Signed)
Patient transported to X-ray 

## 2021-01-23 ENCOUNTER — Telehealth (HOSPITAL_COMMUNITY): Payer: Self-pay

## 2021-01-23 NOTE — Telephone Encounter (Signed)
Called to confirm/remind patient of their appointment at the [facility] on 01/24/21. However, patient's phone recording stated that the call could not be completed at this time.

## 2021-01-24 ENCOUNTER — Encounter (HOSPITAL_COMMUNITY): Payer: Self-pay

## 2021-01-27 ENCOUNTER — Inpatient Hospital Stay
Admission: RE | Admit: 2021-01-27 | Discharge: 2021-01-27 | Disposition: A | Discharge status: Home / Self Care | Payer: Self-pay | Source: Ambulatory Visit | Attending: Radiation Oncology | Admitting: Radiation Oncology

## 2021-01-27 ENCOUNTER — Other Ambulatory Visit: Payer: Self-pay | Admitting: Radiation Oncology

## 2021-01-27 ENCOUNTER — Ambulatory Visit
Admission: RE | Admit: 2021-01-27 | Discharge: 2021-01-27 | Disposition: A | Discharge status: Home / Self Care | Payer: Self-pay | Source: Ambulatory Visit | Attending: Radiation Oncology | Admitting: Radiation Oncology

## 2021-01-27 DIAGNOSIS — C50911 Malignant neoplasm of unspecified site of right female breast: Secondary | ICD-10-CM

## 2021-01-30 ENCOUNTER — Ambulatory Visit: Admission: RE | Admit: 2021-01-30 | Payer: Self-pay | Source: Ambulatory Visit | Admitting: Radiation Oncology

## 2021-01-30 ENCOUNTER — Ambulatory Visit: Payer: Self-pay

## 2021-02-03 NOTE — Progress Notes (Signed)
New Breast Cancer Diagnosis: Right Breast Cancer UOQ  Did patient present with symptoms (if so, please note symptoms) or screening mammography?:Palpable mass    Location and Extent of disease :right breast. Located in the upper outer quadrant, measured  4 mm in greatest dimension. Adenopathy yes.  Pathology of Right Breast Margin Re-excision 12/27/2020   Histology per Pathology Report: grade High, Invasive Ductal Carcinoma 11/30/2020  Receptor Status: ER(negative), PR (negative), Her2-neu (positive), Ki-(22%)  Surgeon and surgical plan, if any:  Dr. Mikey Bussing -Right Breast margin re-excision 12/27/2020 -Right Breast Lumpectomy 11/30/2020- 6 foci of IDC, largest 86m but did have LVI and 2/18 nodes. -Right complete axillary lymph node dissection 11/30/2020 -Follow-up completed, skin healed.   Medical oncologist, treatment if any:   LNeita CarpDO 01/21/2021 -Neoadjuvant chemotherapy with targeted anti-Her2 therapy with herceptin and Perjeta. -C1 Herceptin/Perjeta only due to poor PS.  05/16/2020 added weekly Taxol. -Chemo completion 10/2020.   Family History of Breast/Ovarian/Prostate Cancer: None  Lymphedema issues, if any: none     Pain issues, if any: Denies pain at surgical site.  She reports pain in her bilateral arms, legs, and feet, taking tylenol.    SAFETY ISSUES: Prior radiation? No Pacemaker/ICD? no Possible current pregnancy? Postmenopausal Is the patient on methotrexate? No  Current Complaints / other details:   -Port Insertion 04/03/2020

## 2021-02-04 ENCOUNTER — Ambulatory Visit
Admission: RE | Admit: 2021-02-04 | Discharge: 2021-02-04 | Disposition: A | Discharge status: Home / Self Care | Payer: Self-pay | Source: Ambulatory Visit | Attending: Radiation Oncology | Admitting: Radiation Oncology

## 2021-02-04 ENCOUNTER — Encounter: Payer: Self-pay | Admitting: Radiation Oncology

## 2021-02-04 ENCOUNTER — Other Ambulatory Visit: Payer: Self-pay

## 2021-02-04 ENCOUNTER — Encounter: Payer: Self-pay | Admitting: General Practice

## 2021-02-04 VITALS — Ht 66.0 in | Wt 180.0 lb

## 2021-02-04 DIAGNOSIS — C50411 Malignant neoplasm of upper-outer quadrant of right female breast: Secondary | ICD-10-CM

## 2021-02-04 DIAGNOSIS — Z171 Estrogen receptor negative status [ER-]: Secondary | ICD-10-CM

## 2021-02-04 NOTE — Progress Notes (Signed)
[facility] Psychosocial Distress Screening Clinical Social Work  Clinical Social Work was referred by distress screening protocol.  The patient scored a 5 on the Psychosocial Distress Thermometer which indicates moderate distress. Clinical Social Worker contacted patient by phone to assess for distress and other psychosocial needs. Aware of intended treatment plan.  She is from [place], now staying w son while in radiation treatment.  She will get chemotherapy in [place] every "30 days."  This will occur during her radiation treatment.  She in insured through [employer].  She does have transportation concerns - her son works.  She gets a small amount of Physicist, medical.  Reports some food insecurity.  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  She has not applied for disability benefits.  She does not drive, wants to be referred to [employer].    ONCBCN DISTRESS SCREENING 02/04/2021  Screening Type Initial Screening  Distress experienced in past week (1-10) 5  Emotional problem type Nervousness/Anxiety;Adjusting to illness  Other Contact via phone    Clinical Social Worker follow up needed: No.  If yes, follow up plan:  Beverely Pace, Page, LCSW Clinical Social Worker Phone:  [phone]

## 2021-02-04 NOTE — Progress Notes (Signed)
Radiation Oncology         [phone] ________________________________  Initial Outpatient Consultation - Conducted via telephone due to current COVID-19 concerns for limiting patient exposure  I spoke with the patient to conduct this consult visit via telephone to spare the patient unnecessary potential exposure in the healthcare setting during the current COVID-19 pandemic. The patient was notified in advance and was offered a [place] meeting to allow for face to face communication but unfortunately reported that they did not have the appropriate resources/technology to support such a visit and instead preferred to proceed with a telephone consult.  ________________________________  Name: Marie Church        MRN: [id number]  Date of Service: 02/04/2021 DOB: 1955/07/06  KD:[id number], Provider Not In  Albertine Patricia, MD     REFERRING PHYSICIAN: Albertine Patricia, MD   DIAGNOSIS: The encounter diagnosis was Malignant neoplasm of upper-outer quadrant of right breast in female, estrogen receptor negative ([place]).   HISTORY OF PRESENT ILLNESS: Marie Church is a 65 y.o. female seen at the request of Dr. Mikey Bussing in radiation oncology at [facility].  The patient has a diagnosis of clinical T2N1 ER/PR negative, HER2 amplified right breast cancer on 03/14/20.  She was originally diagnosed after having a palpable mass on the right breast.  She had been admitted to the hospital due to weakness and the work-up began there.  She received neoadjuvant chemotherapy and targeted anti-HER2 therapy with Herceptin and Perjeta.  It appears that she completed her last dose of chemotherapy in June 2022.  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  She has relocated her care to [facility] for the radiation and is seen to discuss adjuvant radiotherapy.  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 Her smoking use included cigarettes. She has never used smokeless tobacco. She reports that she does not drink alcohol and does not use drugs. The patient is single. She lives in [place] and will stay in [place] for her radiation with her son who lives here.    ALLERGIES: Doxycycline, Amoxicillin-pot clavulanate, and Lisinopril  MEDICATIONS:  Current Outpatient Medications  Medication Sig Dispense Refill   aspirin 81 MG chewable tablet Chew 1 tablet (81 mg total) by mouth daily. (Patient not taking: Reported on 01/08/2021) 30 tablet 3   carvedilol (COREG) 3.125 MG tablet Take 1 tablet (3.125 mg total) by mouth 2 (two) times daily with a meal. 60 tablet 11   dapagliflozin  propanediol (FARXIGA) 10 MG TABS tablet Take 1 tablet (10 mg total) by mouth daily before breakfast. 30 tablet 11   Dexlansoprazole (DEXILANT) 30 MG capsule Take 30 mg by mouth daily.     furosemide (LASIX) 40 MG tablet Take 0.5 tablets (20 mg total) by mouth daily. 15 tablet 11   potassium chloride SA (KLOR-CON) 20 MEQ tablet Take 2 tablets (40 mEq total) by mouth daily. 60 tablet 3   sacubitril-valsartan (ENTRESTO) 24-26 MG Take 1 tablet by mouth 2 (two) times daily. 60 tablet 11   spironolactone (ALDACTONE) 25 MG tablet Take 0.5 tablets (12.5 mg total) by mouth daily. 30 tablet 11   No current facility-administered medications for this encounter.     REVIEW OF SYSTEMS: On review of systems, the patient reports that she is doing fairly well. She reports she is having some fullness in her hand, but not any swelling of her arm or breast. No other complaints are verbalized.      PHYSICAL EXAM:  Unable to assess due to encounter type.    ECOG = 1  0 - Asymptomatic (Fully active, able to carry on all predisease activities without restriction)  1 - Symptomatic but completely ambulatory (Restricted in physically strenuous activity but ambulatory and able to carry out work of a light or sedentary nature. For example, light housework, office work)  2 - Symptomatic, <50% in bed during the day (Ambulatory and capable of all self care but unable to carry out any work activities. Up and about more than 50% of waking hours)  3 - Symptomatic, >50% in bed, but not bedbound (Capable of only limited self-care, confined to bed or chair 50% or more of waking hours)  4 - Bedbound (Completely disabled. Cannot carry on any self-care. Totally confined to bed or chair)  5 - Death   Eustace Pen MM, Creech RH, Tormey DC, et al. [phone]). "Toxicity and response criteria of the [facility]". [place] Oncol. 5 (6): 649-55    LABORATORY DATA:  Lab Results  Component Value Date   WBC 3.8  (L) 01/17/2021   HGB 11.2 (L) 01/17/2021   HCT 36.9 01/17/2021   MCV 86.4 01/17/2021   PLT 226 01/17/2021   Lab Results  Component Value Date   NA 136 01/17/2021   K 4.0 01/17/2021   CL 101 01/17/2021   CO2 26 01/17/2021   Lab Results  Component Value Date   ALT 16 12/18/2020   AST 32 12/18/2020   ALKPHOS 47 12/18/2020   BILITOT 0.7 12/18/2020      RADIOGRAPHY: DG Cervical Spine Complete  Result Date: 01/17/2021 CLINICAL DATA:  Right arm and neck pain. EXAM: CERVICAL SPINE - COMPLETE 4+ VIEW COMPARISON:  None. FINDINGS: The prevertebral soft tissues are normal. The alignment is anatomic through T1. There is no evidence of acute fracture or traumatic subluxation. The C1-2 articulation appears normal in the AP projection. There is mild multilevel spondylosis with uncinate spurring and facet hypertrophy. No significant osseous foraminal narrowing. 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 Left-sided Port-A-Cath in place. IMPRESSION: No acute cervical spine findings. Mild  spondylosis without high-grade foraminal narrowing. Electronically Signed   By: Richardean Sale M.D.   On: 01/17/2021 19:36   DG Humerus Right  Result Date: 01/17/2021 CLINICAL DATA:  Right arm and neck pain. EXAM: RIGHT HUMERUS - 2+ VIEW COMPARISON:  None. FINDINGS: The mineralization and alignment are normal. There is no evidence of acute fracture or dislocation. Mild degenerative changes at the shoulder. The soft tissues appear unremarkable. IMPRESSION: No acute osseous findings. Electronically Signed   By: Richardean Sale M.D.   On: 01/17/2021 19:37       IMPRESSION/PLAN: 1. 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 Dr. Lisbeth Renshaw discusses the pathology findings and reviews the nature of node positive breast disease. She has completed chemotherapy and will remain on antiHER2 therapy. She is a candidate for external radiotherapy to the breast and regional nodes to reduce  risks of local recurrence followed by antiestrogen therapy. We discussed the risks, benefits, short, and long term effects of radiotherapy, as well as the curative intent, and the patient is interested in proceeding. Dr. Lisbeth Renshaw discusses the delivery and logistics of radiotherapy and anticipates a course of 6 1/2 weeks of radiotherapy to the right breast and axillary nodes. She will be called for scheduling of simulation and subsequent treatment.  Given current concerns for patient exposure during the COVID-19 pandemic, this encounter was conducted via telephone.  The patient has provided two factor identification and has given verbal consent for this type of encounter and has been advised to only accept a meeting of this type in a secure network environment. The time spent during this encounter was 60 minutes including preparation, discussion, and coordination of the patient's care. The attendants for this meeting include Blenda Nicely, RN, Dr. Lisbeth Renshaw, Hayden Pedro  and [name].  During the encounter,  Blenda Nicely, RN, Dr. Lisbeth Renshaw, and Hayden Pedro were located at [facility] Radiation Oncology Department.  Azriel Dancy was located at her son's home.   The above documentation reflects my direct findings during this shared patient visit. Please see the separate note by Dr. Lisbeth Renshaw on this date for the remainder of the patient's plan of care.    Carola Rhine, [facility]    **Disclaimer: This note was dictated with voice recognition software. Similar sounding words can inadvertently be transcribed and this note may contain transcription errors which may not have been corrected upon publication of note.**

## 2021-02-07 ENCOUNTER — Ambulatory Visit: Payer: Self-pay | Admitting: Radiation Oncology

## 2021-02-10 ENCOUNTER — Emergency Department (HOSPITAL_COMMUNITY): Admission: EM | Admit: 2021-02-10 | Discharge: 2021-02-10 | Discharge status: Against Medical Advice | Payer: Self-pay

## 2021-02-10 ENCOUNTER — Other Ambulatory Visit: Payer: Self-pay

## 2021-02-10 NOTE — ED Notes (Signed)
Pt stated that she is not going to wait any longer and is going to leave. Encouraged pt to stay to be seen by a doctor. Pt refused and left ED.

## 2021-02-11 ENCOUNTER — Ambulatory Visit: Payer: Self-pay | Admitting: Rehabilitation

## 2021-02-12 ENCOUNTER — Ambulatory Visit: Payer: Self-pay | Admitting: Radiation Oncology

## 2021-02-12 ENCOUNTER — Ambulatory Visit: Admission: RE | Admit: 2021-02-12 | Payer: Self-pay | Source: Ambulatory Visit | Admitting: Radiation Oncology

## 2021-02-12 DIAGNOSIS — Z171 Estrogen receptor negative status [ER-]: Secondary | ICD-10-CM | POA: Insufficient documentation

## 2021-02-12 DIAGNOSIS — C50411 Malignant neoplasm of upper-outer quadrant of right female breast: Secondary | ICD-10-CM | POA: Insufficient documentation

## 2021-02-12 NOTE — Addendum Note (Signed)
Encounter addended by: Kyung Rudd, MD on: 02/12/2021 2:17 PM  Actions taken: Edit attestation on clinical note

## 2021-02-18 ENCOUNTER — Ambulatory Visit: Payer: Self-pay | Admitting: Radiation Oncology

## 2021-02-18 ENCOUNTER — Other Ambulatory Visit: Payer: Self-pay

## 2021-02-19 ENCOUNTER — Ambulatory Visit: Payer: Self-pay

## 2021-02-19 ENCOUNTER — Other Ambulatory Visit: Payer: Self-pay

## 2021-02-19 ENCOUNTER — Ambulatory Visit
Admission: RE | Admit: 2021-02-19 | Discharge: 2021-02-19 | Disposition: A | Discharge status: Home / Self Care | Payer: Self-pay | Source: Ambulatory Visit | Attending: Radiation Oncology | Admitting: Radiation Oncology

## 2021-02-19 DIAGNOSIS — C50411 Malignant neoplasm of upper-outer quadrant of right female breast: Secondary | ICD-10-CM | POA: Insufficient documentation

## 2021-02-19 DIAGNOSIS — Z171 Estrogen receptor negative status [ER-]: Secondary | ICD-10-CM | POA: Insufficient documentation

## 2021-02-20 ENCOUNTER — Ambulatory Visit
Admission: RE | Admit: 2021-02-20 | Discharge: 2021-02-20 | Disposition: A | Discharge status: Home / Self Care | Payer: Self-pay | Source: Ambulatory Visit | Attending: Radiation Oncology | Admitting: Radiation Oncology

## 2021-02-20 ENCOUNTER — Other Ambulatory Visit: Payer: Self-pay

## 2021-02-20 NOTE — Progress Notes (Signed)
Pt here for patient teaching.  Pt given Radiation and You booklet, skin care instructions, Alra deodorant, and Radiaplex gel.  Reviewed areas of pertinence such as fatigue, hair loss, skin changes, breast tenderness, and breast swelling . Pt able to give teach back of to pat skin and use unscented/gentle soap,apply Radiaplex bid, avoid applying anything to skin within 4 hours of treatment, avoid wearing an under wire bra, and to use an electric razor if they must shave. Pt verbalizes understanding of information given and will contact nursing with any questions or concerns.     [email or web address]/treatmentinformation/[email or web address]  Gloriajean Dell. [facility]

## 2021-02-21 ENCOUNTER — Ambulatory Visit
Admission: RE | Admit: 2021-02-21 | Discharge: 2021-02-21 | Disposition: A | Discharge status: Home / Self Care | Payer: Self-pay | Source: Ambulatory Visit | Attending: Radiation Oncology | Admitting: Radiation Oncology

## 2021-02-21 DIAGNOSIS — Z171 Estrogen receptor negative status [ER-]: Secondary | ICD-10-CM

## 2021-02-21 DIAGNOSIS — C50411 Malignant neoplasm of upper-outer quadrant of right female breast: Secondary | ICD-10-CM

## 2021-02-21 MED ORDER — RADIAPLEXRX EX GEL
Freq: Once | CUTANEOUS | Status: AC
Start: 2021-02-21 — End: 2021-02-21

## 2021-02-21 MED ORDER — ALRA NON-METALLIC DEODORANT (RAD-ONC)
1.0000 "application " | Freq: Once | TOPICAL | Status: AC
  Administered 2021-02-21: 1 via TOPICAL

## 2021-02-24 ENCOUNTER — Ambulatory Visit
Admission: RE | Admit: 2021-02-24 | Discharge: 2021-02-24 | Disposition: A | Discharge status: Home / Self Care | Payer: Self-pay | Source: Ambulatory Visit | Attending: Radiation Oncology | Admitting: Radiation Oncology

## 2021-02-24 ENCOUNTER — Other Ambulatory Visit: Payer: Self-pay

## 2021-02-25 ENCOUNTER — Ambulatory Visit
Admission: RE | Admit: 2021-02-25 | Discharge: 2021-02-25 | Disposition: A | Discharge status: Home / Self Care | Payer: Self-pay | Source: Ambulatory Visit | Attending: Radiation Oncology | Admitting: Radiation Oncology

## 2021-02-25 ENCOUNTER — Other Ambulatory Visit: Payer: Self-pay

## 2021-02-26 ENCOUNTER — Other Ambulatory Visit: Payer: Self-pay

## 2021-02-26 ENCOUNTER — Ambulatory Visit
Admission: RE | Admit: 2021-02-26 | Discharge: 2021-02-26 | Disposition: A | Discharge status: Home / Self Care | Payer: Self-pay | Source: Ambulatory Visit | Attending: Radiation Oncology | Admitting: Radiation Oncology

## 2021-02-27 ENCOUNTER — Ambulatory Visit
Admission: RE | Admit: 2021-02-27 | Discharge: 2021-02-27 | Disposition: A | Discharge status: Home / Self Care | Payer: Self-pay | Source: Ambulatory Visit | Attending: Radiation Oncology | Admitting: Radiation Oncology

## 2021-02-27 ENCOUNTER — Other Ambulatory Visit: Payer: Self-pay

## 2021-02-28 ENCOUNTER — Ambulatory Visit
Admission: RE | Admit: 2021-02-28 | Discharge: 2021-02-28 | Disposition: A | Discharge status: Home / Self Care | Payer: Self-pay | Source: Ambulatory Visit | Attending: Radiation Oncology | Admitting: Radiation Oncology

## 2021-02-28 ENCOUNTER — Other Ambulatory Visit: Payer: Self-pay

## 2021-03-03 ENCOUNTER — Other Ambulatory Visit: Payer: Self-pay

## 2021-03-03 ENCOUNTER — Ambulatory Visit: Payer: Self-pay | Attending: Surgical Oncology

## 2021-03-03 ENCOUNTER — Ambulatory Visit (HOSPITAL_COMMUNITY): Payer: Self-pay | Attending: Internal Medicine

## 2021-03-03 ENCOUNTER — Ambulatory Visit
Admission: RE | Admit: 2021-03-03 | Discharge: 2021-03-03 | Disposition: A | Discharge status: Home / Self Care | Payer: Self-pay | Source: Ambulatory Visit | Attending: Radiation Oncology | Admitting: Radiation Oncology

## 2021-03-03 DIAGNOSIS — Z483 Aftercare following surgery for neoplasm: Secondary | ICD-10-CM | POA: Insufficient documentation

## 2021-03-03 DIAGNOSIS — R293 Abnormal posture: Secondary | ICD-10-CM | POA: Insufficient documentation

## 2021-03-03 DIAGNOSIS — I5022 Chronic systolic (congestive) heart failure: Secondary | ICD-10-CM | POA: Insufficient documentation

## 2021-03-03 DIAGNOSIS — C50911 Malignant neoplasm of unspecified site of right female breast: Secondary | ICD-10-CM | POA: Insufficient documentation

## 2021-03-03 DIAGNOSIS — M6281 Muscle weakness (generalized): Secondary | ICD-10-CM | POA: Insufficient documentation

## 2021-03-03 LAB — ECHOCARDIOGRAM COMPLETE
Area-P 1/2: 4.89 cm2
S' Lateral: 2.8 cm

## 2021-03-03 NOTE — Therapy (Signed)
[facility] @ [place], [place], [address] Phone: [phone]   Fax:  [phone]  Physical Therapy Evaluation  Patient Details  Name: Marie Church MRN: [id number] Date of Birth: 09-21-1955 Referring Provider (PT): Dr. 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    Subjective Assessment - 03/03/21 1427     Subjective Lacks strength in her hands and her hands swell. Having difficulty using both arms and has had trouble with this since having chemo. Md thinks she has neuropathy.  Her feet hurt, and both knees hurt before the surgery but not as bad. presently having radiation and will end in November    Pertinent History Pt s cancer was found when she went to the hospital and had a lot of tests done.  She had chemo starting  on 04/03/20 and ending about 3 months later.    She had a right lumpectomy with ALND on 11/30/20 and  a re-excision done on 12/27/2020. 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 ALND with 18 LN's removed per pt. She is presently undergoing radiation    How long can you stand comfortably? 20 min    How long can you walk comfortably? 10 min    Patient Stated Goals Be able to use hands, open water bottle, use arms better, improve endurance/fatigue    Currently in Pain? No/denies                [facility] PT Assessment - 03/03/21 0001       Assessment   Medical Diagnosis Right breast Cancer    Referring Provider (PT) Dr. Mikey Bussing    Onset Date/Surgical Date 11/30/20   reexcision date 01/06/2021   Hand Dominance Right    Prior Therapy yes      Precautions   Precaution Comments lymphedema risk      Restrictions   Weight Bearing Restrictions Yes    Other Position/Activity Restrictions ambulating with a stiff right knee      Balance Screen   Has the patient fallen in the past 6 months No    Has the patient  had a decrease in activity level because of a fear of falling?  No    Is the patient reluctant to leave their home because of a fear of falling?  No      Home Social worker Private residence    Living Arrangements Alone   stays with son alot   Available Help at Discharge Family    Type of [place] to enter    Home Layout One level      Prior Function   Level of [place] Unemployed    Leisure reading      Cognition   Overall Cognitive Status Within Functional Limits for tasks assessed    Attention Divided   pt is very fatigued today from multiple appts.     Observation/Other Assessments   Observations incisions healed in axillary and breast region.  firm nodular area noted prox to axillary incision, darkening of  skin from radiation.  Inferior axillary swelling.Pt very fatigued today      Posture/Postural Control   Posture/Postural Control Postural limitations    Postural Limitations Rounded Shoulders;Forward head      AROM   Right/Left Shoulder Right;Left    Right Shoulder Extension 45 Degrees    Right Shoulder Flexion 92 Degrees    Right Shoulder ABduction 61 Degrees    Left Shoulder Extension 48 Degrees    Left Shoulder Flexion 105 Degrees    Left Shoulder ABduction 55 Degrees               LYMPHEDEMA/ONCOLOGY QUESTIONNAIRE - 03/03/21 0001       Type   Cancer Type invasive ductal carcinoma      Surgeries   Lumpectomy Date 11/30/20    Axillary Lymph Node Dissection Date 11/30/20    Other Surgery Date 01/06/21   re-excision to get clear margins   Number Lymph Nodes Removed 18      Treatment   Active Chemotherapy Treatment No    Past Chemotherapy Treatment Yes    Active Radiation Treatment Yes    Past Radiation Treatment No    Current Hormone Treatment No    Past Hormone Therapy No      What other symptoms do you have   Are you Having Heaviness or Tightness No    Are you having Pain No    Are you having pitting edema No    Is it Hard or Difficult finding clothes that fit No    Do you have infections No      Right Upper Extremity Lymphedema   10 cm Proximal to Olecranon Process 35 cm    Olecranon Process 26.4 cm    10 cm Proximal to Ulnar Styloid Process 21.7 cm    Just Proximal to Ulnar Styloid Process 16.8 cm    Across Hand at [employer] 18.9 cm    At [place] of 2nd Digit 6.1 cm      Left Upper Extremity Lymphedema   10 cm Proximal to Olecranon Process 33.9 cm    Olecranon Process 26.5 cm    10 cm Proximal to Ulnar Styloid Process 22.5 cm    Just Proximal to Ulnar Styloid Process 17.1 cm    Across Hand at [employer] 18.4 cm    At [place] of 2nd Digit 6.1 cm                   Quick Dash -  03/03/21 0001     Open a tight or new jar Severe  difficulty    Do heavy household chores (wash walls, wash floors) Severe difficulty    Carry a shopping bag or briefcase Moderate difficulty    Wash your back Severe difficulty    Use a knife to cut food Moderate difficulty    Recreational activities in which you take some force or impact through your arm, shoulder, or hand (golf, hammering, tennis) Severe difficulty    During the past week, to what extent has your arm, shoulder or hand problem interfered with your normal social activities with family, friends, neighbors, or groups? Quite a bit    During the past week, to what extent has your arm, shoulder or hand problem limited your work or other regular daily activities Quite a bit    Arm, shoulder, or hand pain. Severe    Tingling (pins and needles) in your arm, shoulder, or hand Mild    Difficulty Sleeping Moderate difficulty    DASH Score 63.64 %              Objective measurements completed on examination: See above findings.                     PT Long Term Goals - 03/03/21 1820       PT LONG TERM GOAL #1   Title Pt will be independent and compliant with a HEP for ROM and stregthening to improve shoulder function    Time 6    Period Weeks    Status New    Target Date 04/14/21      PT LONG TERM GOAL #2   Title pt will have bilateral shoulder flexion an d abductionatleast 120 degrees for improved reaching ability    Time 6    Period Weeks    Status New    Target Date 04/14/21      PT LONG TERM GOAL #3   Title pt will be able reach to shelf in kitchen to place and remove objects    Time 6    Period Weeks    Status New    Target Date 04/14/21      PT LONG TERM GOAL #4   Title Pt will have quick dash no greater than 30%    Baseline 63%    Time 6    Period Weeks    Status New    Target Date 04/14/21                    Plan - 03/03/21 1810     Clinical Impression Statement Pt is s/p right breast lumpectomy with ALND (18 LN's) on  11/30/20 and reexcision on 01/06/2021.  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  She is presently undergoing radiation.  She was late today and was very fatigued from multiple appts.  Shoulder AROM was very limited bilaterally today secondary to weakness without pain, and compensation was present. There was no significant difference in circumference of her arms presently.  There is some inferior axillary swelling and breast fibrosis noted and she may benefit from a compression bra.  She will need assessment of LE's strength next visit    Personal Factors and Comorbidities Comorbidity 3+    Comorbidities Right breast CA with mets to LN, DM, Chemo induced CHF, present radiation    Examination-Activity Limitations Reach Overhead;Stand;Lift;Locomotion Level    Stability/Clinical Decision Making Stable/Uncomplicated  Clinical Decision Making Low    Rehab Potential Good    PT Frequency 2x / week    PT Duration 6 weeks    PT Treatment/Interventions ADLs/Self Care Home Management;Therapeutic exercise;Manual techniques;Patient/family education;Manual lymph drainage;Passive range of motion    PT Next Visit Plan Assess LE strength, sit to stand, TUG, grip strength,make LE goals AAROM for shoulders, MLD for inferior axillary region, general strength and conditioning, Lymphedema precautions (no computer)    Recommended Other Services compression bra,    Consulted and Agree with Plan of Care Patient             Patient will benefit from skilled therapeutic intervention in order to improve the following deficits and impairments:  Decreased activity tolerance, Decreased mobility, Decreased strength, Postural dysfunction, Decreased knowledge of precautions, Decreased skin integrity, Impaired UE functional use, Decreased endurance, Decreased range of motion, Difficulty walking  Visit Diagnosis: Abnormal posture  Muscle weakness (generalized)  Aftercare following surgery for  neoplasm  Invasive ductal carcinoma of breast, right [facility])     Problem List Patient Active Problem List   Diagnosis Date Noted   Acute on chronic respiratory failure with hypoxia ([place]) 10/08/2020   Acute on chronic systolic CHF (congestive heart failure) ([place]) 10/08/2020   Hypoxia 10/03/2020   Hypertensive urgency 10/03/2020   Shortness of breath 10/03/2020   Elevated troponin 10/03/2020   Severe protein-calorie malnutrition ([place]) 08/16/2020   Ulcer of right groin, limited to breakdown of skin ([place]) 08/08/2020   Anemia of chronic disease 07/31/2020   GERD without esophagitis 07/31/2020   Type 2 diabetes mellitus without complication, without long-term current use of insulin ([place]) 07/31/2020   Bilateral pneumonia 07/19/2020   Breast cancer ([place]) 04/03/2020   Malignant neoplasm of upper-outer quadrant of right breast in female, estrogen receptor negative ([place]) 03/18/2020   Sepsis ([place]) 03/14/2020   Axillary adenopathy 03/12/2020   Macrocytic anemia 02/29/2020   Physical debility 01/30/2020   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   Difficulty in walking 03/25/2018   Osteoarthritis of knee 03/25/2018   Arthritis 01/28/2018   Essential hypertension 01/28/2018   Bilateral knee pain 12/22/2011   Dizziness 12/22/2011   Eustachian tube dysfunction 12/22/2011   Otalgia 12/22/2011   Sinusitis 12/22/2011   Sore throat 12/22/2011    Claris Pong, PT 03/03/2021, 6:25 PM  [facility] @ [place], [place], [address] Phone: [phone]   Fax:  [phone]  Name: Daijha Leggio MRN: [id number] Date of Birth: 07/11/1955

## 2021-03-04 ENCOUNTER — Ambulatory Visit: Payer: Self-pay

## 2021-03-05 ENCOUNTER — Ambulatory Visit
Admission: RE | Admit: 2021-03-05 | Discharge: 2021-03-05 | Disposition: A | Discharge status: Home / Self Care | Payer: Self-pay | Source: Ambulatory Visit | Attending: Radiation Oncology | Admitting: Radiation Oncology

## 2021-03-05 ENCOUNTER — Other Ambulatory Visit: Payer: Self-pay

## 2021-03-05 ENCOUNTER — Ambulatory Visit: Payer: Self-pay | Admitting: Cardiovascular Disease

## 2021-03-06 ENCOUNTER — Other Ambulatory Visit: Payer: Self-pay

## 2021-03-06 ENCOUNTER — Ambulatory Visit
Admission: RE | Admit: 2021-03-06 | Discharge: 2021-03-06 | Disposition: A | Discharge status: Home / Self Care | Payer: Self-pay | Source: Ambulatory Visit | Attending: Radiation Oncology | Admitting: Radiation Oncology

## 2021-03-07 ENCOUNTER — Telehealth: Payer: Self-pay

## 2021-03-07 ENCOUNTER — Ambulatory Visit
Admission: RE | Admit: 2021-03-07 | Discharge: 2021-03-07 | Disposition: A | Discharge status: Home / Self Care | Payer: Self-pay | Source: Ambulatory Visit | Attending: Radiation Oncology | Admitting: Radiation Oncology

## 2021-03-07 NOTE — Telephone Encounter (Addendum)
Tried calling patient and ould not leave a voice message due to voicemail box is full. Will try calling again and will send letter out with comments from patient.  ----- Message from Almyra Deforest, [place] sent at 03/07/2021  4:02 PM EDT ----- Pumping function of heart significant improved, now near the lower border of normal. Continue the current therapy

## 2021-03-10 ENCOUNTER — Ambulatory Visit
Admission: RE | Admit: 2021-03-10 | Discharge: 2021-03-10 | Disposition: A | Discharge status: Home / Self Care | Payer: Self-pay | Source: Ambulatory Visit | Attending: Radiation Oncology | Admitting: Radiation Oncology

## 2021-03-11 ENCOUNTER — Ambulatory Visit
Admission: RE | Admit: 2021-03-11 | Discharge: 2021-03-11 | Disposition: A | Discharge status: Home / Self Care | Payer: Self-pay | Source: Ambulatory Visit | Attending: Radiation Oncology | Admitting: Radiation Oncology

## 2021-03-12 ENCOUNTER — Ambulatory Visit
Admission: RE | Admit: 2021-03-12 | Discharge: 2021-03-12 | Disposition: A | Discharge status: Home / Self Care | Payer: Self-pay | Source: Ambulatory Visit | Attending: Radiation Oncology | Admitting: Radiation Oncology

## 2021-03-13 ENCOUNTER — Ambulatory Visit
Admission: RE | Admit: 2021-03-13 | Discharge: 2021-03-13 | Disposition: A | Discharge status: Home / Self Care | Payer: Self-pay | Source: Ambulatory Visit | Attending: Radiation Oncology | Admitting: Radiation Oncology

## 2021-03-14 ENCOUNTER — Ambulatory Visit: Payer: Self-pay | Admitting: Radiation Oncology

## 2021-03-14 ENCOUNTER — Ambulatory Visit
Admission: RE | Admit: 2021-03-14 | Discharge: 2021-03-14 | Disposition: A | Discharge status: Home / Self Care | Payer: Self-pay | Source: Ambulatory Visit | Attending: Radiation Oncology | Admitting: Radiation Oncology

## 2021-03-14 ENCOUNTER — Other Ambulatory Visit: Payer: Self-pay

## 2021-03-17 ENCOUNTER — Ambulatory Visit
Admission: RE | Admit: 2021-03-17 | Discharge: 2021-03-17 | Disposition: A | Discharge status: Home / Self Care | Payer: Self-pay | Source: Ambulatory Visit | Attending: Radiation Oncology | Admitting: Radiation Oncology

## 2021-03-17 ENCOUNTER — Other Ambulatory Visit: Payer: Self-pay

## 2021-03-18 ENCOUNTER — Ambulatory Visit
Admission: RE | Admit: 2021-03-18 | Discharge: 2021-03-18 | Disposition: A | Discharge status: Home / Self Care | Payer: Self-pay | Source: Ambulatory Visit | Attending: Radiation Oncology | Admitting: Radiation Oncology

## 2021-03-18 ENCOUNTER — Other Ambulatory Visit: Payer: Self-pay

## 2021-03-18 DIAGNOSIS — C50411 Malignant neoplasm of upper-outer quadrant of right female breast: Secondary | ICD-10-CM | POA: Insufficient documentation

## 2021-03-18 DIAGNOSIS — Z171 Estrogen receptor negative status [ER-]: Secondary | ICD-10-CM | POA: Insufficient documentation

## 2021-03-19 ENCOUNTER — Ambulatory Visit: Payer: Self-pay

## 2021-03-20 ENCOUNTER — Ambulatory Visit: Payer: Self-pay

## 2021-03-20 ENCOUNTER — Ambulatory Visit
Admission: RE | Admit: 2021-03-20 | Discharge: 2021-03-20 | Disposition: A | Discharge status: Home / Self Care | Payer: Self-pay | Source: Ambulatory Visit | Attending: Radiation Oncology | Admitting: Radiation Oncology

## 2021-03-21 ENCOUNTER — Ambulatory Visit: Payer: Self-pay | Admitting: Radiation Oncology

## 2021-03-21 ENCOUNTER — Ambulatory Visit
Admission: RE | Admit: 2021-03-21 | Discharge: 2021-03-21 | Disposition: A | Discharge status: Home / Self Care | Payer: Self-pay | Source: Ambulatory Visit | Attending: Radiation Oncology | Admitting: Radiation Oncology

## 2021-03-21 ENCOUNTER — Other Ambulatory Visit: Payer: Self-pay

## 2021-03-24 ENCOUNTER — Ambulatory Visit
Admission: RE | Admit: 2021-03-24 | Discharge: 2021-03-24 | Disposition: A | Discharge status: Home / Self Care | Payer: Self-pay | Source: Ambulatory Visit | Attending: Radiation Oncology | Admitting: Radiation Oncology

## 2021-03-25 ENCOUNTER — Ambulatory Visit
Admission: RE | Admit: 2021-03-25 | Discharge: 2021-03-25 | Disposition: A | Discharge status: Home / Self Care | Payer: Self-pay | Source: Ambulatory Visit | Attending: Radiation Oncology | Admitting: Radiation Oncology

## 2021-03-26 ENCOUNTER — Ambulatory Visit
Admission: RE | Admit: 2021-03-26 | Discharge: 2021-03-26 | Disposition: A | Discharge status: Home / Self Care | Payer: Self-pay | Source: Ambulatory Visit | Attending: Radiation Oncology | Admitting: Radiation Oncology

## 2021-03-27 ENCOUNTER — Ambulatory Visit: Payer: Self-pay

## 2021-03-28 ENCOUNTER — Ambulatory Visit: Payer: Self-pay

## 2021-03-28 ENCOUNTER — Emergency Department (HOSPITAL_COMMUNITY): Payer: Self-pay

## 2021-03-28 ENCOUNTER — Ambulatory Visit
Admission: RE | Admit: 2021-03-28 | Discharge: 2021-03-28 | Disposition: A | Discharge status: Home / Self Care | Payer: Self-pay | Source: Ambulatory Visit | Attending: Radiation Oncology | Admitting: Radiation Oncology

## 2021-03-28 ENCOUNTER — Encounter (HOSPITAL_COMMUNITY): Payer: Self-pay | Admitting: Emergency Medicine

## 2021-03-28 ENCOUNTER — Emergency Department (HOSPITAL_COMMUNITY)
Admission: EM | Admit: 2021-03-28 | Discharge: 2021-03-28 | Disposition: A | Discharge status: Home / Self Care | Payer: Self-pay | Attending: Emergency Medicine | Admitting: Emergency Medicine

## 2021-03-28 ENCOUNTER — Other Ambulatory Visit: Payer: Self-pay

## 2021-03-28 DIAGNOSIS — D696 Thrombocytopenia, unspecified: Secondary | ICD-10-CM

## 2021-03-28 DIAGNOSIS — Z79899 Other long term (current) drug therapy: Secondary | ICD-10-CM | POA: Insufficient documentation

## 2021-03-28 DIAGNOSIS — Z7982 Long term (current) use of aspirin: Secondary | ICD-10-CM | POA: Insufficient documentation

## 2021-03-28 DIAGNOSIS — I110 Hypertensive heart disease with heart failure: Secondary | ICD-10-CM | POA: Insufficient documentation

## 2021-03-28 DIAGNOSIS — E871 Hypo-osmolality and hyponatremia: Secondary | ICD-10-CM

## 2021-03-28 DIAGNOSIS — I5023 Acute on chronic systolic (congestive) heart failure: Secondary | ICD-10-CM | POA: Insufficient documentation

## 2021-03-28 DIAGNOSIS — R509 Fever, unspecified: Secondary | ICD-10-CM | POA: Insufficient documentation

## 2021-03-28 DIAGNOSIS — Z853 Personal history of malignant neoplasm of breast: Secondary | ICD-10-CM | POA: Insufficient documentation

## 2021-03-28 DIAGNOSIS — Z87891 Personal history of nicotine dependence: Secondary | ICD-10-CM | POA: Insufficient documentation

## 2021-03-28 DIAGNOSIS — R059 Cough, unspecified: Secondary | ICD-10-CM | POA: Insufficient documentation

## 2021-03-28 DIAGNOSIS — E876 Hypokalemia: Secondary | ICD-10-CM

## 2021-03-28 DIAGNOSIS — Z20822 Contact with and (suspected) exposure to covid-19: Secondary | ICD-10-CM | POA: Insufficient documentation

## 2021-03-28 DIAGNOSIS — R000 Tachycardia, unspecified: Secondary | ICD-10-CM | POA: Insufficient documentation

## 2021-03-28 DIAGNOSIS — E119 Type 2 diabetes mellitus without complications: Secondary | ICD-10-CM | POA: Insufficient documentation

## 2021-03-28 LAB — COMPREHENSIVE METABOLIC PANEL
ALT: 23 U/L (ref 0–44)
AST: 70 U/L — ABNORMAL HIGH (ref 15–41)
Albumin: 3 g/dL — ABNORMAL LOW (ref 3.5–5.0)
Alkaline Phosphatase: 53 U/L (ref 38–126)
Anion gap: 8 (ref 5–15)
BUN: 11 mg/dL (ref 8–23)
CO2: 26 mmol/L (ref 22–32)
Calcium: 8.5 mg/dL — ABNORMAL LOW (ref 8.9–10.3)
Chloride: 96 mmol/L — ABNORMAL LOW (ref 98–111)
Creatinine, Ser: 0.76 mg/dL (ref 0.44–1.00)
GFR, Estimated: >60 mL/min (ref >60)
Glucose, Bld: 108 mg/dL — ABNORMAL HIGH (ref 70–99)
Potassium: 3.3 mmol/L — ABNORMAL LOW (ref 3.5–5.1)
Sodium: 130 mmol/L — ABNORMAL LOW (ref 135–145)
Total Bilirubin: 0.6 mg/dL (ref 0.3–1.2)
Total Protein: 9.3 g/dL — ABNORMAL HIGH (ref 6.5–8.1)

## 2021-03-28 LAB — CBC WITH DIFFERENTIAL/PLATELET
Abs Immature Granulocytes: 0.07 10*3/uL (ref 0.00–0.07)
Basophils Absolute: 0 10*3/uL (ref 0.0–0.1)
Basophils Relative: 0 %
Eosinophils Absolute: 0.2 10*3/uL (ref 0.0–0.5)
Eosinophils Relative: 2 %
HCT: 32.1 % — ABNORMAL LOW (ref 36.0–46.0)
Hemoglobin: 10.2 g/dL — ABNORMAL LOW (ref 12.0–15.0)
Immature Granulocytes: 1 %
Lymphocytes Relative: 24 %
Lymphs Abs: 1.7 10*3/uL (ref 0.7–4.0)
MCH: 26.6 pg (ref 26.0–34.0)
MCHC: 31.8 g/dL (ref 30.0–36.0)
MCV: 83.6 fL (ref 80.0–100.0)
Monocytes Absolute: 1.2 10*3/uL — ABNORMAL HIGH (ref 0.1–1.0)
Monocytes Relative: 17 %
Neutro Abs: 4 10*3/uL (ref 1.7–7.7)
Neutrophils Relative %: 56 %
Platelets: 106 10*3/uL — ABNORMAL LOW (ref 150–400)
RBC: 3.84 MIL/uL — ABNORMAL LOW (ref 3.87–5.11)
RDW: 16 % — ABNORMAL HIGH (ref 11.5–15.5)
WBC: 7.2 10*3/uL (ref 4.0–10.5)
nRBC: 0 % (ref 0.0–0.2)

## 2021-03-28 LAB — LACTIC ACID, PLASMA: Lactic Acid, Venous: 1.3 mmol/L (ref 0.5–1.9)

## 2021-03-28 LAB — URINALYSIS, ROUTINE W REFLEX MICROSCOPIC
Bacteria, UA: NONE SEEN
Bilirubin Urine: NEGATIVE
Glucose, UA: >=500 mg/dL — AB
Hgb urine dipstick: NEGATIVE
Ketones, ur: NEGATIVE mg/dL
Leukocytes,Ua: NEGATIVE
Nitrite: NEGATIVE
Protein, ur: 100 mg/dL — AB
Specific Gravity, Urine: 1.014 (ref 1.005–1.030)
pH: 6 (ref 5.0–8.0)

## 2021-03-28 LAB — RESP PANEL BY RT-PCR (FLU A&B, COVID) ARPGX2
Influenza A by PCR: NEGATIVE
Influenza B by PCR: NEGATIVE
SARS Coronavirus 2 by RT PCR: NEGATIVE

## 2021-03-28 LAB — LIPASE, BLOOD: Lipase: 56 U/L — ABNORMAL HIGH (ref 11–51)

## 2021-03-28 LAB — CBG MONITORING, ED: Glucose-Capillary: 150 mg/dL — ABNORMAL HIGH (ref 70–99)

## 2021-03-28 IMAGING — CT CT ABD-PELV W/ CM
2 of 5 series · 15 of 46 positions shown, 17 images · IV contrast (omnipaque)
Comparison: [DATE].

CLINICAL DATA: Abdominal pain, acute, nonlocalized.

EXAM:
CT ABDOMEN AND PELVIS WITH CONTRAST
TECHNIQUE: Multidetector CT imaging of the abdomen and pelvis was performed
using the standard protocol following bolus administration of
intravenous contrast.
CONTRAST:  80mL OMNIPAQUE IOHEXOL 350 MG/ML SOLN

[Series 2: axial st · axial · 0.73mm/px · z∈[-402,+13]mm · 12 of 97 slices shown, 14 images]
[im 7/97  soft-tissue]
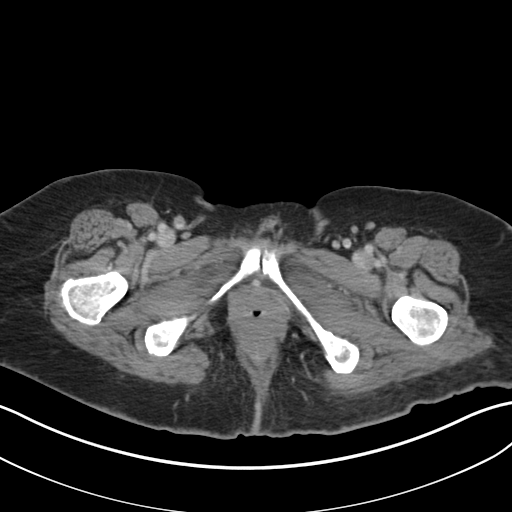
[im 7/97  bone]
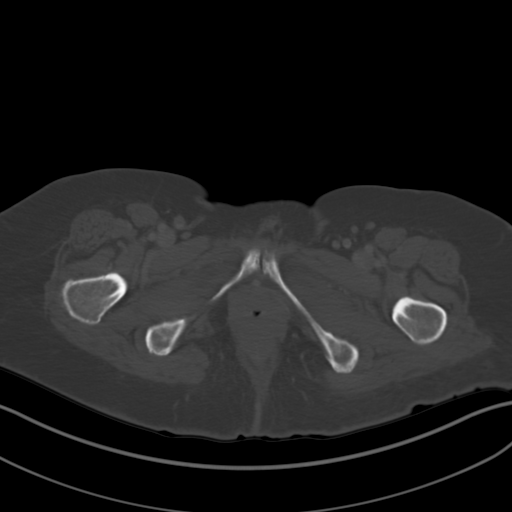
[im 13/97  soft-tissue]
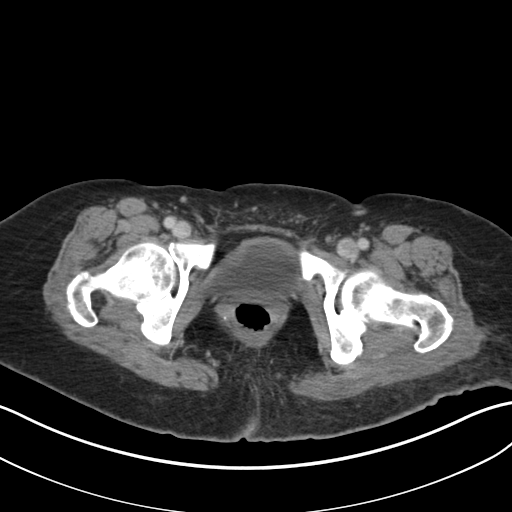
[im 20/97  soft-tissue]
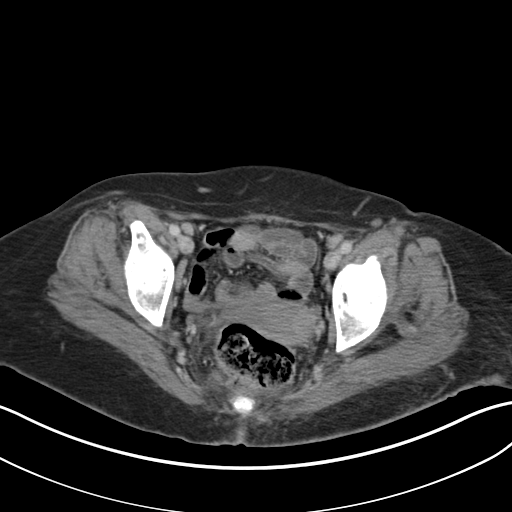
[im 33/97  soft-tissue]
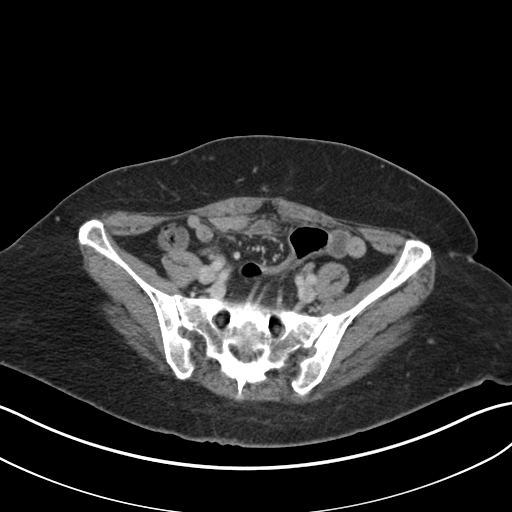
[im 39/97  soft-tissue]
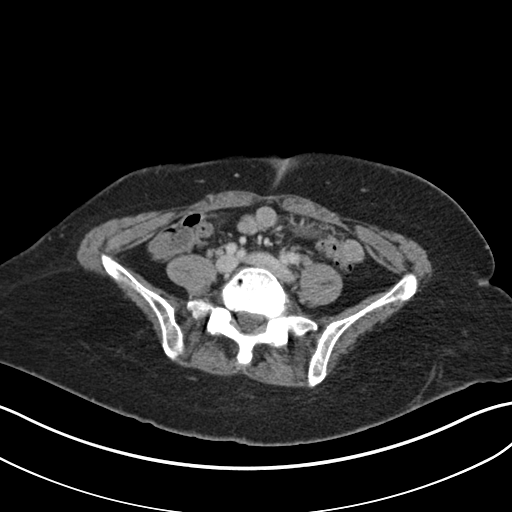
[im 45/97  soft-tissue]
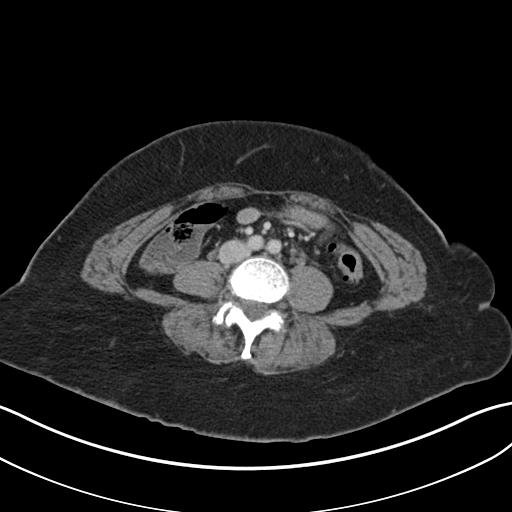
[im 52/97  soft-tissue]
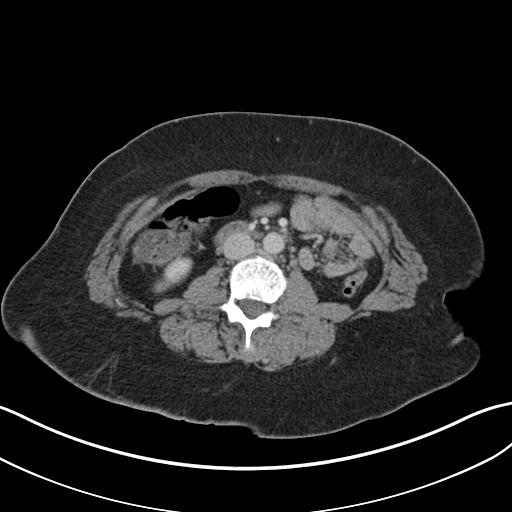
[im 58/97  soft-tissue]
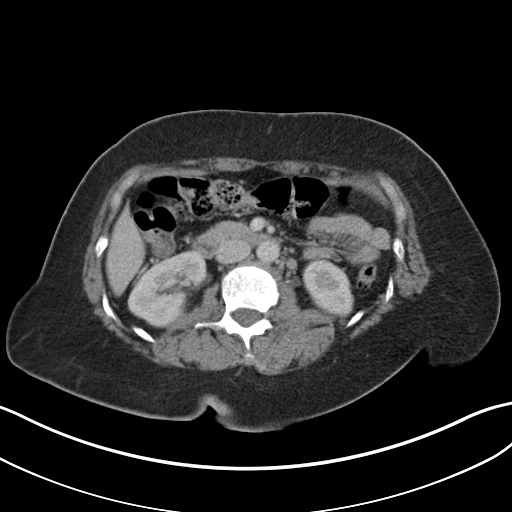
[im 65/97  soft-tissue]
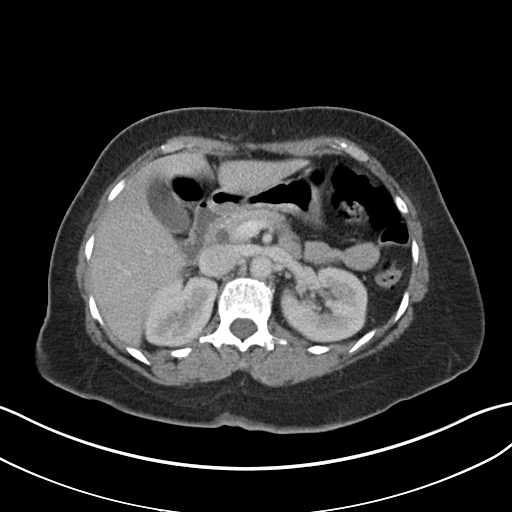
[im 65/97  bone]
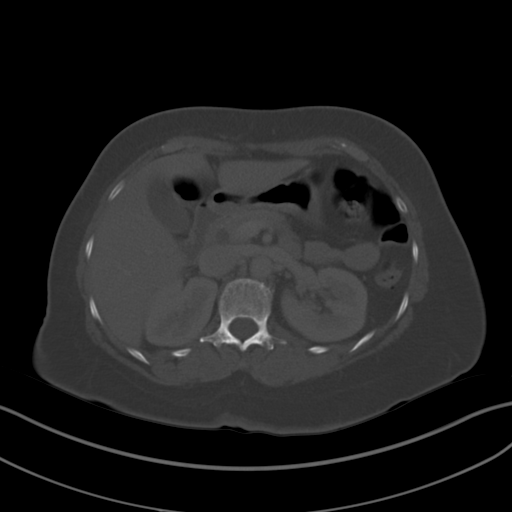
[im 77/97  soft-tissue]
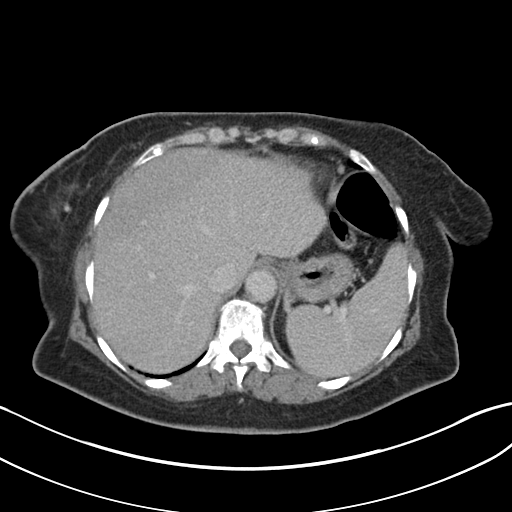
[im 84/97  soft-tissue]
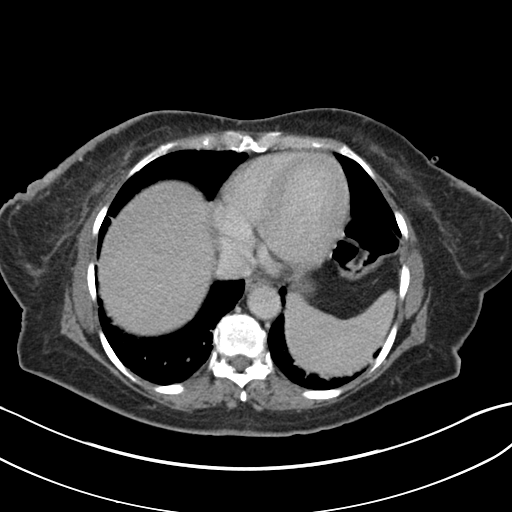
[im 90/97  soft-tissue]
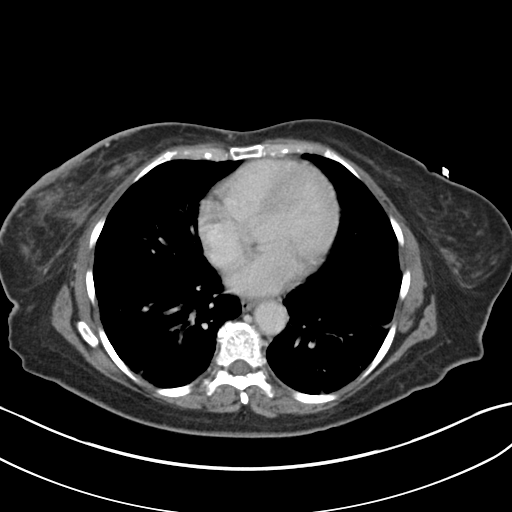

[Series 5: coronal st · coronal · 0.73mm/px · 3 of 148 slices shown]
[im 50/148  soft-tissue]
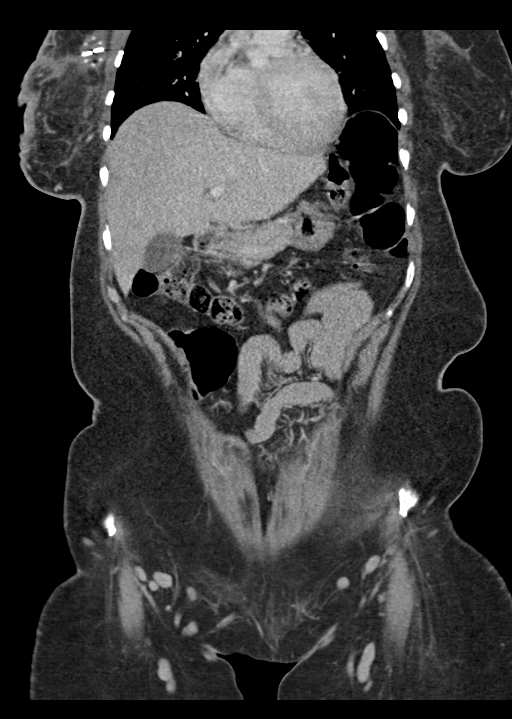
[im 66/148  soft-tissue]
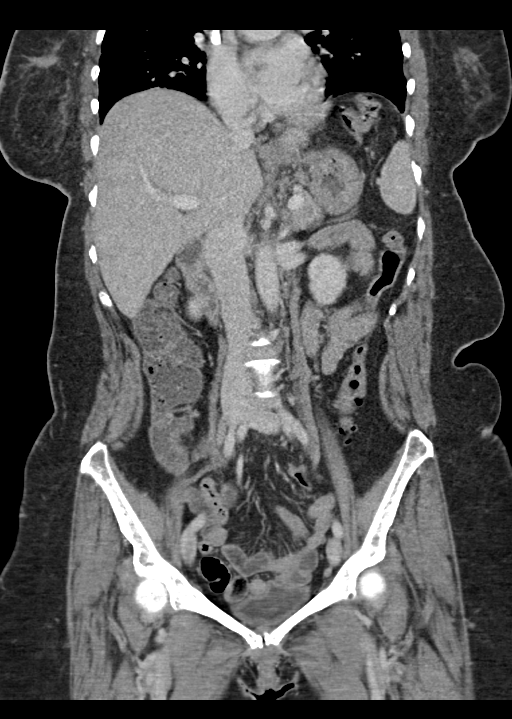
[im 82/148  soft-tissue]
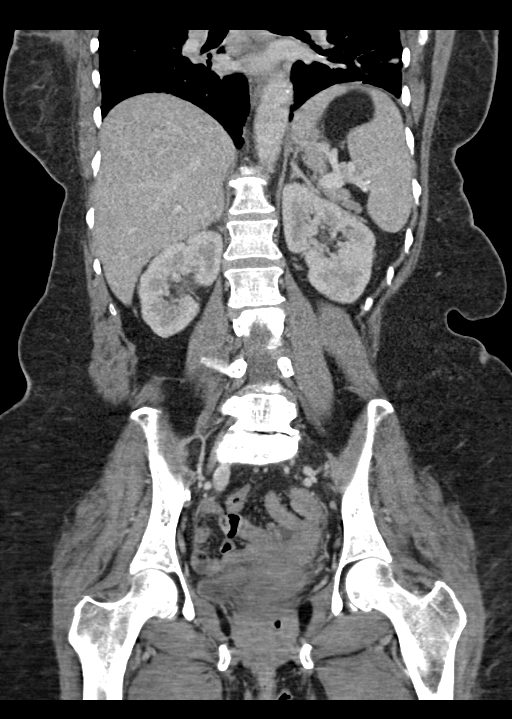

[15 of 46 positions shown; findings below may reference images not displayed]

FINDINGS: Lower chest: The distal tip of a right central venous catheter
terminates at the cavoatrial junction. The heart is normal in size.
There is interstitial prominence and atelectasis at the lung bases.
A few scattered pulmonary nodules are noted bilaterally, the largest
in the right middle lobe measuring 5 mm, axial image 27.

Hepatobiliary: No focal liver abnormality is seen. No gallstones,
gallbladder wall thickening, or biliary dilatation.

Pancreas: Unremarkable. No pancreatic ductal dilatation or
surrounding inflammatory changes.

Spleen: Normal in size without focal abnormality.

Adrenals/Urinary Tract: Adrenal glands are unremarkable. Kidneys are
normal, without renal calculi, focal lesion, or hydronephrosis. A
subcentimeter hypodensity is present in the lower pole of the left
kidney which is too small to further characterize. The urinary
bladder is within normal limits for degree of distension.

Stomach/Bowel: No bowel obstruction, free air or pneumatosis. The
stomach is unremarkable. A few scattered diverticula are present
along the colon without evidence of diverticulitis. No focal bladder
wall thickening is seen. The appendix is within normal limits.

Vascular/Lymphatic: Aortic atherosclerosis. No enlarged abdominal or
pelvic lymph nodes.

Reproductive: The uterus is in-situ and contains a few hyperdense
lesions suggesting fibroids.

Other: No free fluid in the pelvis.

Musculoskeletal: Surgical changes, subcutaneous fat stranding and
skin thickening are noted over the right breast, compatible with
history of radiation therapy. No suspicious osseous lesion is
identified. Degenerative changes are present in the thoracolumbar
spine.
IMPRESSION: 1. Interstitial prominence at the lung bases which is new from the
prior exam and may be associated with edema or pneumonitis.
2. Nonspecific scattered pulmonary nodules at the lung bases.
Short-term follow-up is recommended to exclude the possibility of
neoplastic process.
3. Postsurgical changes in the right breast with skin thickening,
compatible with history of radiation therapy.

## 2021-03-28 MED ORDER — POTASSIUM CHLORIDE CRYS ER 20 MEQ PO TBCR
40.0000 meq | EXTENDED_RELEASE_TABLET | Freq: Once | ORAL | Status: AC
  Administered 2021-03-28: 40 meq via ORAL
  Filled 2021-03-28: qty 2

## 2021-03-28 MED ORDER — GUAIFENESIN-DM 100-10 MG/5ML PO SYRP
15.0000 mL | ORAL_SOLUTION | Freq: Once | ORAL | Status: AC
  Administered 2021-03-28: 15 mL via ORAL
  Filled 2021-03-28: qty 20

## 2021-03-28 MED ORDER — IOHEXOL 350 MG/ML SOLN
80.0000 mL | Freq: Once | INTRAVENOUS | Status: AC | PRN
  Administered 2021-03-28: 80 mL via INTRAVENOUS

## 2021-03-28 MED ORDER — SODIUM CHLORIDE 0.9 % IV BOLUS
1000.0000 mL | Freq: Once | INTRAVENOUS | Status: AC
  Administered 2021-03-28: 1000 mL via INTRAVENOUS

## 2021-03-28 MED ORDER — ACETAMINOPHEN 500 MG PO TABS
1000.0000 mg | ORAL_TABLET | Freq: Once | ORAL | Status: AC
  Administered 2021-03-28: 1000 mg via ORAL
  Filled 2021-03-28: qty 2

## 2021-03-28 MED ORDER — AZITHROMYCIN 250 MG PO TABS: ORAL_TABLET | ORAL | 0 refills | Status: DC

## 2021-03-28 MED ORDER — CEFDINIR 300 MG PO CAPS: 300.0000 mg | ORAL_CAPSULE | Freq: Two times a day (BID) | ORAL | 0 refills | Status: DC

## 2021-03-28 MED ORDER — HEPARIN SOD (PORK) LOCK FLUSH 100 UNIT/ML IV SOLN
500.0000 [IU] | Freq: Once | INTRAVENOUS | Status: AC
  Administered 2021-03-28: 500 [IU]
  Filled 2021-03-28: qty 5

## 2021-03-28 MED ORDER — SODIUM CHLORIDE 0.9 % IV SOLN
2.0000 g | Freq: Once | INTRAVENOUS | Status: AC
  Administered 2021-03-28: 2 g via INTRAVENOUS
  Filled 2021-03-28: qty 2

## 2021-03-28 NOTE — ED Provider Notes (Signed)
[facility] DEPT Provider Note   CSN: [id number] Arrival date & time: 03/28/21  1653     History Chief Complaint  Patient presents with   Cough   Fever    Marie Church is a 65 y.o. female.  Patient with hx metastatic breast ca s/p Kadcyla tx ~ 1 week ago, radiation tx today, sent from radiology as patient with cough and fever. Symptoms acute onset in past day, moderate, persistent. Cough episodic, generally non productive. No hemoptysis. No sore throat or runny nose. No known covid or flu exposure. Denies chest pain or discomfort. No sob. +generally weak. Initially did not c/o abd pain, but then on recheck does note abd discomfort/tenderness.  Nausea. No vomiting or diarrhea. No dysuria or gu c/o. New area of pain or swelling. No redness or pain at port site.   The history is provided by a relative, medical records and the patient.  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   Hypertension     Patient Active Problem List   Diagnosis Date Noted   Acute on chronic respiratory failure with hypoxia ([place]) 10/08/2020   Acute on chronic systolic CHF (congestive heart failure) ([place]) 10/08/2020   Hypoxia 10/03/2020   Hypertensive urgency 10/03/2020   Shortness of breath 10/03/2020   Elevated troponin 10/03/2020   Severe protein-calorie malnutrition ([place]) 08/16/2020   Ulcer of right groin, limited to breakdown of skin ([place]) 08/08/2020   Anemia of chronic disease 07/31/2020   GERD without esophagitis  07/31/2020   Type 2 diabetes mellitus without complication, without long-term current use of insulin ([place]) 07/31/2020   Bilateral pneumonia 07/19/2020   Breast cancer ([place]) 04/03/2020   Malignant neoplasm of upper-outer quadrant of right breast in female, estrogen receptor negative ([place]) 03/18/2020   Sepsis ([place]) 03/14/2020   Axillary adenopathy 03/12/2020   Macrocytic anemia 02/29/2020   Physical debility 01/30/2020   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   Difficulty in walking 03/25/2018   Osteoarthritis of knee 03/25/2018   Arthritis 01/28/2018   Essential hypertension 01/28/2018   Bilateral knee pain 12/22/2011   Dizziness 12/22/2011   Eustachian tube dysfunction 12/22/2011   Otalgia 12/22/2011   Sinusitis 12/22/2011   Sore throat 12/22/2011    Past Surgical History:  Procedure Laterality Date   BREAST LUMPECTOMY Right 11/30/2020   RIGHT/LEFT HEART CATH AND CORONARY ANGIOGRAPHY N/A 10/10/2020   Procedure: RIGHT/LEFT HEART CATH AND CORONARY ANGIOGRAPHY;  Surgeon: Jolaine Artist, MD;  Location: [facility];  Service: Cardiovascular;  Laterality: N/A;     OB History   No obstetric history on file.     Family History  Problem Relation Age of Onset   Heart failure Neg Hx    Heart disease Neg Hx    Heart attack Neg Hx     Social History   Tobacco Use   Smoking status: Former    Types: Cigarettes   Smokeless tobacco: Never  Vaping Use   Vaping Use: Never used  Substance Use Topics   Alcohol use: Never   Drug use: Never    Home Medications Prior to Admission medications   Medication Sig Start Date End Date Taking? Authorizing Provider  aspirin 81 MG chewable tablet Chew 1 tablet (81 mg total) by mouth daily. Patient not taking: No sig reported 11/07/20   Croitoru, Mihai, MD  carvedilol (COREG) 3.125 MG tablet Take 1 tablet (3.125 mg total) by mouth 2 (two) times daily with a meal. 01/08/21   Larey Dresser, MD  dapagliflozin propanediol (FARXIGA) 10 MG  TABS tablet Take 1 tablet (10 mg total) by mouth daily before breakfast. 11/07/20   Croitoru, Mihai, MD  Dexlansoprazole 30 MG capsule Take 30 mg by mouth daily.    [provider]  furosemide (LASIX) 40 MG tablet Take 0.5 tablets (20 mg total) by mouth daily. 01/08/21   Larey Dresser, MD  potassium chloride SA (KLOR-CON) 20 MEQ tablet Take 2 tablets (40 mEq total) by mouth daily. 11/14/20   [place], Maricela Bo, FNP  sacubitril-valsartan (ENTRESTO) 24-26 MG Take 1 tablet by mouth 2 (two) times daily. 10/28/20   Almyra Deforest, PA  spironolactone (ALDACTONE) 25 MG tablet Take 0.5 tablets (12.5 mg total) by mouth daily. 11/07/20   Croitoru, Mihai, MD    Allergies    Doxycycline, Amoxicillin-pot clavulanate, and Lisinopril  Review of Systems   Review of Systems  Constitutional:  Positive for fever. Negative for chills.  HENT:  Negative for sore throat.   Eyes:  Negative for redness.  Respiratory:  Positive for cough. Negative for shortness of breath.   Cardiovascular:  Negative for chest pain and leg swelling.  Gastrointestinal:  Positive for abdominal pain. Negative for diarrhea and vomiting.  Genitourinary:  Negative for dysuria and flank pain.  Musculoskeletal:  Negative for back pain, neck pain and neck stiffness.  Skin:  Negative for rash.  Neurological:  Negative for headaches.  Hematological:  Does not bruise/bleed easily.  Psychiatric/Behavioral:  Negative for confusion.    Physical Exam Updated Vital Signs BP (!) 155/115 (BP Location: Right Arm)   Pulse (!) 120   Temp (!) 100.8 F (38.2 C) (Oral)   Resp 20   SpO2 94%   Physical Exam Vitals and nursing note reviewed.  Constitutional:      Appearance: Normal appearance. She is well-developed.  HENT:     Head: Atraumatic.     Right Ear: Tympanic membrane normal.     Left Ear: Tympanic membrane normal.     Nose: Nose normal.     Mouth/Throat:     Mouth: Mucous membranes are moist.     Pharynx: Oropharynx is clear. No  oropharyngeal exudate or posterior oropharyngeal erythema.  Eyes:     General: No scleral icterus.    Conjunctiva/sclera: Conjunctivae normal.  Neck:     Trachea: No tracheal deviation.     Comments: No stiffness or rigidity.  Cardiovascular:     Rate and Rhythm: Regular rhythm. Tachycardia present.     Pulses: Normal pulses.     Heart sounds: Normal heart sounds. No murmur heard.   No friction rub. No gallop.  Pulmonary:     Effort: Pulmonary effort is normal. No respiratory distress.     Breath sounds: Normal breath sounds.     Comments: Port left chest without any sign of infection.  Abdominal:     General: Bowel sounds are normal. There is no distension.     Palpations: Abdomen is soft.  Tenderness: There is no abdominal tenderness. There is no guarding.  Genitourinary:    Comments: No cva tenderness.  Musculoskeletal:        General: No swelling or tenderness.     Cervical back: Normal range of motion and neck supple. No rigidity or tenderness. No muscular tenderness.  Skin:    General: Skin is warm and dry.     Findings: No rash.  Neurological:     Mental Status: She is alert.     Comments: Alert, speech normal. Motor/sens grossly intact bil.   Psychiatric:        Mood and Affect: Mood normal.    ED Results / Procedures / Treatments   Labs (all labs ordered are listed, but only abnormal results are displayed) Results for orders placed or performed during the hospital encounter of 03/28/21  Resp Panel by RT-PCR (Flu A&B, Covid) Nasopharyngeal Swab   Specimen: Nasopharyngeal Swab; Nasopharyngeal(NP) swabs in vial transport medium  Result Value Ref Range   SARS Coronavirus 2 by RT PCR NEGATIVE NEGATIVE   Influenza A by PCR NEGATIVE NEGATIVE   Influenza B by PCR NEGATIVE NEGATIVE  CBC with Differential/Platelet  Result Value Ref Range   WBC 7.2 4.0 - 10.5 K/uL   RBC 3.84 (L) 3.87 - 5.11 MIL/uL   Hemoglobin 10.2 (L) 12.0 - 15.0 g/dL   HCT 32.1 (L) 36.0 - 46.0 %    MCV 83.6 80.0 - 100.0 fL   MCH 26.6 26.0 - 34.0 pg   MCHC 31.8 30.0 - 36.0 g/dL   RDW 16.0 (H) 11.5 - 15.5 %   Platelets 106 (L) 150 - 400 K/uL   nRBC 0.0 0.0 - 0.2 %   Neutrophils Relative % 56 %   Neutro Abs 4.0 1.7 - 7.7 K/uL   Lymphocytes Relative 24 %   Lymphs Abs 1.7 0.7 - 4.0 K/uL   Monocytes Relative 17 %   Monocytes Absolute 1.2 (H) 0.1 - 1.0 K/uL   Eosinophils Relative 2 %   Eosinophils Absolute 0.2 0.0 - 0.5 K/uL   Basophils Relative 0 %   Basophils Absolute 0.0 0.0 - 0.1 K/uL   Immature Granulocytes 1 %   Abs Immature Granulocytes 0.07 0.00 - 0.07 K/uL  Comprehensive metabolic panel  Result Value Ref Range   Sodium 130 (L) 135 - 145 mmol/L   Potassium 3.3 (L) 3.5 - 5.1 mmol/L   Chloride 96 (L) 98 - 111 mmol/L   CO2 26 22 - 32 mmol/L   Glucose, Bld 108 (H) 70 - 99 mg/dL   BUN 11 8 - 23 mg/dL   Creatinine, Ser 0.76 0.44 - 1.00 mg/dL   Calcium 8.5 (L) 8.9 - 10.3 mg/dL   Total Protein 9.3 (H) 6.5 - 8.1 g/dL   Albumin 3.0 (L) 3.5 - 5.0 g/dL   AST 70 (H) 15 - 41 U/L   ALT 23 0 - 44 U/L   Alkaline Phosphatase 53 38 - 126 U/L   Total Bilirubin 0.6 0.3 - 1.2 mg/dL   GFR, Estimated >60 >60 mL/min   Anion gap 8 5 - 15  Urinalysis, Routine w reflex microscopic Urine, Clean Catch  Result Value Ref Range   Color, Urine YELLOW YELLOW   APPearance CLEAR CLEAR   Specific Gravity, Urine 1.014 1.005 - 1.030   pH 6.0 5.0 - 8.0   Glucose, UA >=500 (A) NEGATIVE mg/dL   Hgb urine dipstick NEGATIVE NEGATIVE   Bilirubin Urine NEGATIVE NEGATIVE   Ketones, ur NEGATIVE  NEGATIVE mg/dL   Protein, ur 100 (A) NEGATIVE mg/dL   Nitrite NEGATIVE NEGATIVE   Leukocytes,Ua NEGATIVE NEGATIVE   RBC / HPF 0-5 0 - 5 RBC/hpf   WBC, UA 0-5 0 - 5 WBC/hpf   Bacteria, UA NONE SEEN NONE SEEN   Squamous Epithelial / LPF 0-5 0 - 5   Mucus PRESENT    Hyaline Casts, UA PRESENT   Lactic acid, plasma  Result Value Ref Range   Lactic Acid, Venous 1.3 0.5 - 1.9 mmol/L  Lipase, blood  Result Value  Ref Range   Lipase 56 (H) 11 - 51 U/L  POC CBG, ED  Result Value Ref Range   Glucose-Capillary 150 (H) 70 - 99 mg/dL   DG Chest Port 1 View  Result Date: 03/28/2021 CLINICAL DATA:  Fever EXAM: PORTABLE CHEST 1 VIEW COMPARISON:  12/18/2020 FINDINGS: Left-sided central venous port tip over the SVC. No focal opacity or pleural effusion. Normal cardiomediastinal silhouette. No pneumothorax. IMPRESSION: No active disease. Electronically Signed   By: Donavan Foil M.D.   On: 03/28/2021 17:53   ECHOCARDIOGRAM COMPLETE  Result Date: 03/03/2021    ECHOCARDIOGRAM REPORT   Patient Name:   Marie Church Date of Exam: 03/03/2021 Medical Rec #:  [id number]        Height:       66.0 in Accession #:    [id number]       Weight:       180.0 lb Date of Birth:  21-Jun-1955        BSA:          1.912 m Patient Age:    89 years         BP:           113/82 mmHg Patient Gender: F                HR:           92 bpm. Exam Location:  [facility] Procedure: 2D Echo, Cardiac Doppler, Color Doppler and Strain Analysis Indications:    I50.22 CHF  History:        Patient has prior history of Echocardiogram examinations, most                 recent 10/04/2020. Breast cancer; Risk Factors:Hypertension and                 Diabetes.  Sonographer:    Marygrace Drought RCS Referring Phys: [phone] HAO MENG IMPRESSIONS  1. Left ventricular ejection fraction, by estimation, is 50%. The left ventricle has low normal function. The left ventricle has no regional wall motion abnormalities. Left ventricular diastolic parameters are grossly normal. The average left ventricular global longitudinal strain is -20.1 %. The global longitudinal strain is normal.  2. Right ventricular systolic function is normal. The right ventricular size is normal. Tricuspid regurgitation signal is inadequate for assessing PA pressure.  3. The mitral valve is normal in structure. Trivial mitral valve regurgitation. No evidence of mitral stenosis.  4. The aortic valve is  tricuspid. Aortic valve regurgitation is not visualized. No aortic stenosis is present.  5. The inferior vena cava is normal in size with greater than 50% respiratory variability, suggesting right atrial pressure of 3 mmHg. Comparison(s): A prior study was performed on 10/04/20. Prior images reviewed side by side. LVEF has improved. FINDINGS  Left Ventricle: Left ventricular ejection fraction, by estimation, is 50%. The left ventricle has low normal function. The left ventricle has no  regional wall motion abnormalities. The average left ventricular global longitudinal strain is -20.1 %. The global longitudinal strain is normal. The left ventricular internal cavity size was normal in size. There is no left ventricular hypertrophy. Left ventricular diastolic parameters were normal. Right Ventricle: The right ventricular size is normal. No increase in right ventricular wall thickness. Right ventricular systolic function is normal. Tricuspid regurgitation signal is inadequate for assessing PA pressure. Left Atrium: Left atrial size was normal in size. Right Atrium: Right atrial size was normal in size. Pericardium: There is no evidence of pericardial effusion. Mitral Valve: The mitral valve is normal in structure. Trivial mitral valve regurgitation. No evidence of mitral valve stenosis. Tricuspid Valve: The tricuspid valve is normal in structure. Tricuspid valve regurgitation is trivial. No evidence of tricuspid stenosis. Aortic Valve: The aortic valve is tricuspid. Aortic valve regurgitation is not visualized. No aortic stenosis is present. Pulmonic Valve: The pulmonic valve was normal in structure. Pulmonic valve regurgitation is trivial. No evidence of pulmonic stenosis. Aorta: The aortic root is normal in size and structure. Venous: The inferior vena cava is normal in size with greater than 50% respiratory variability, suggesting right atrial pressure of 3 mmHg. IAS/Shunts: No atrial level shunt detected by color  flow Doppler.  LEFT VENTRICLE PLAX 2D LVIDd:         3.50 cm   Diastology LVIDs:         2.80 cm   LV e' medial:    25.60 cm/s LV PW:         0.60 cm   LV E/e' medial:  3.3 LV IVS:        0.60 cm   LV e' lateral:   17.30 cm/s LVOT diam:     1.80 cm   LV E/e' lateral: 4.9 LV SV:         38 LV SV Index:   20        2D Longitudinal Strain LVOT Area:     2.54 cm  2D Strain GLS Avg:     -20.1 %  RIGHT VENTRICLE RV Basal diam:  3.00 cm LEFT ATRIUM             Index        RIGHT ATRIUM          Index LA diam:        3.40 cm 1.78 cm/m   RA Area:     9.86 cm LA Vol (A2C):   44.7 ml 23.38 ml/m  RA Volume:   19.10 ml 9.99 ml/m LA Vol (A4C):   24.8 ml 12.97 ml/m LA Biplane Vol: 35.4 ml 18.51 ml/m  AORTIC VALVE LVOT Vmax:   88.60 cm/s LVOT Vmean:  59.700 cm/s LVOT VTI:    0.148 m  AORTA Ao Root diam: 2.30 cm MITRAL VALVE MV Area (PHT):             SHUNTS MV Decel Time:             Systemic VTI:  0.15 m MV E velocity: 84.40 cm/s  Systemic Diam: 1.80 cm MV A velocity: 71.30 cm/s MV E/A ratio:  1.18 Cherlynn Kaiser MD Electronically signed by Cherlynn Kaiser MD Signature Date/Time: 03/03/2021/3:56:49 PM    Final     EKG None  Radiology DG Chest Port 1 View  Result Date: 03/28/2021 CLINICAL DATA:  Fever EXAM: PORTABLE CHEST 1 VIEW COMPARISON:  12/18/2020 FINDINGS: Left-sided central venous port tip over the SVC. No focal opacity or  pleural effusion. Normal cardiomediastinal silhouette. No pneumothorax. IMPRESSION: No active disease. Electronically Signed   By: Donavan Foil M.D.   On: 03/28/2021 17:53    Procedures Procedures   Medications Ordered in ED Medications  ceFEPIme (MAXIPIME) 2 g in sodium chloride 0.9 % 100 mL IVPB (has no administration in time range)    ED Course  I have reviewed the triage vital signs and the nursing notes.  Pertinent labs & imaging results that were available during my care of the patient were reviewed by me and considered in my medical decision making (see chart for  details).    MDM Rules/Calculators/A&P                          Iv ns. Labs and cultures sent. Post cultures, iv abx given. Pcxr.   Reviewed nursing notes and prior charts for additional history.   Labs reviewed/interpreted by me - not neutropenic. Plt low. Na mildly low.   CXR reviewed/interpreted by me - no def pna.   On recheck, also w c/o abd pain/tenderness. No peritoneal signs. Ct added to workup.  CT reviewed/interpreted by me - no acute abd process. ?pneumonitis. Given fever, cough, will rx.  Additional ivf. Po fluids/food.   Rx omnicef+zithromax.   Pt currently is breathing comfortably, no distress, and is not toxic appearing. Pt currently appears stable for d/c.   Rec close outpt f/u.  Return precautions provided.     Final Clinical Impression(s) / ED Diagnoses Final diagnoses:  None    Rx / DC Orders ED Discharge Orders     None        Lajean Saver, MD 03/31/21 1650

## 2021-03-28 NOTE — ED Provider Notes (Signed)
Emergency Medicine Provider Triage Evaluation Note  Marie Church , a 65 y.o. female  was evaluated in triage.  Pt w hx breast ca, s/p Kadcyla infusion a week ago, with fever, general weakness, non prod cough. Went to radiation tx today and was sent to ED due to fever/weakness.   Review of Systems  Positive: fever Negative: Vomiting, sob  Physical Exam  BP (!) 155/115 (BP Location: Right Arm)   Pulse (!) 120   Temp (!) 100.8 F (38.2 C) (Oral)   Resp 20   SpO2 94%  Gen:   +febrile.  Resp:  Normal effort. No infection at port left chest MSK:   Moves extremities without difficulty  Other:  No edema.   Medical Decision Making  Medically screening exam initiated at 5:31 PM.  Appropriate orders placed.  Bristol Osentoski was informed that the remainder of the evaluation will be completed by another provider, this initial triage assessment does not replace that evaluation, and the importance of remaining in the ED until their evaluation is complete.  Labs and abx ordered.      Lajean Saver, MD 03/28/21 [phone]

## 2021-03-28 NOTE — ED Triage Notes (Signed)
Patient complains of a cough x 1-2 weeks and a fever, weakness. Has been getting radiation breast cancer.

## 2021-03-28 NOTE — ED Notes (Signed)
Pt. CBG 150, RN, Lysbeth Galas made aware.

## 2021-03-28 NOTE — ED Notes (Signed)
Patient asked for pain medication, Dr. Ashok Cordia notified.

## 2021-03-28 NOTE — Progress Notes (Signed)
Patient with complaints of dry cough for about 2 weeks.  She was noted to have temperature of 100.3 here in the clinic.  Denies any other symptoms at this time.  She states she had negative flu and covid PCR earlier this month.  Denies any exposure to covid.  Has not received flu vaccine this season.   She is currently on chemotherapy, last infusion 03/19/2021.  She is receiving radiation therapy to her right breast/supraclavicular, last treatment today, fraction 25/33.  She was seen by her medical oncologist on 03/19/2021.  She was advised to take covid test to make sure nothing has changed, treat the temperature with tylenol or ibuprofen.  She was also advised to reach out to her medical oncologist to inform them of her symptoms.  She was encouraged to go to the nearest emergency department should her symptoms worsen.  She and son verbalized understanding.  Gloriajean Dell. Leonie Green, BSN

## 2021-03-28 NOTE — ED Notes (Signed)
Patient given crackers and drink.

## 2021-03-28 NOTE — Discharge Instructions (Addendum)
It was our pleasure to provide your ER care today - we hope that you feel better.  Drink plenty of fluids/stay well hydrated.   From todays labs, your sodium level is mildly low (130), potassium mildly low (3.3) and platelet count low (106) - eat plenty of fruits and vegetables, and follow up with your doctor this coming week.   Take antibiotics as prescribed.  Take acetaminophen or ibuprofen as need for fever.   Follow up with primary care doctor Monday if symptoms fail to improve/resolve.  Return to ER if worse, new/severe pain, trouble breathing, or other concern.

## 2021-03-30 ENCOUNTER — Emergency Department (HOSPITAL_COMMUNITY): Payer: Self-pay

## 2021-03-30 ENCOUNTER — Inpatient Hospital Stay (HOSPITAL_COMMUNITY)
Admission: EM | Admit: 2021-03-30 | Discharge: 2021-04-15 | DRG: 853 | Disposition: A | Discharge status: Home Health | Payer: Self-pay | Attending: Internal Medicine | Admitting: Internal Medicine

## 2021-03-30 ENCOUNTER — Encounter (HOSPITAL_COMMUNITY): Payer: Self-pay | Admitting: Emergency Medicine

## 2021-03-30 DIAGNOSIS — I959 Hypotension, unspecified: Secondary | ICD-10-CM | POA: Diagnosis not present

## 2021-03-30 DIAGNOSIS — J9601 Acute respiratory failure with hypoxia: Secondary | ICD-10-CM | POA: Diagnosis present

## 2021-03-30 DIAGNOSIS — I509 Heart failure, unspecified: Secondary | ICD-10-CM

## 2021-03-30 DIAGNOSIS — R109 Unspecified abdominal pain: Secondary | ICD-10-CM

## 2021-03-30 DIAGNOSIS — J9600 Acute respiratory failure, unspecified whether with hypoxia or hypercapnia: Secondary | ICD-10-CM | POA: Diagnosis present

## 2021-03-30 DIAGNOSIS — Z87891 Personal history of nicotine dependence: Secondary | ICD-10-CM

## 2021-03-30 DIAGNOSIS — D696 Thrombocytopenia, unspecified: Secondary | ICD-10-CM | POA: Diagnosis present

## 2021-03-30 DIAGNOSIS — E119 Type 2 diabetes mellitus without complications: Secondary | ICD-10-CM | POA: Diagnosis present

## 2021-03-30 DIAGNOSIS — G9341 Metabolic encephalopathy: Secondary | ICD-10-CM

## 2021-03-30 DIAGNOSIS — Z9221 Personal history of antineoplastic chemotherapy: Secondary | ICD-10-CM

## 2021-03-30 DIAGNOSIS — T451X5A Adverse effect of antineoplastic and immunosuppressive drugs, initial encounter: Secondary | ICD-10-CM | POA: Diagnosis present

## 2021-03-30 DIAGNOSIS — C50411 Malignant neoplasm of upper-outer quadrant of right female breast: Secondary | ICD-10-CM | POA: Diagnosis present

## 2021-03-30 DIAGNOSIS — Z20822 Contact with and (suspected) exposure to covid-19: Secondary | ICD-10-CM | POA: Diagnosis present

## 2021-03-30 DIAGNOSIS — I5020 Unspecified systolic (congestive) heart failure: Secondary | ICD-10-CM | POA: Diagnosis present

## 2021-03-30 DIAGNOSIS — R4182 Altered mental status, unspecified: Secondary | ICD-10-CM

## 2021-03-30 DIAGNOSIS — Z7982 Long term (current) use of aspirin: Secondary | ICD-10-CM

## 2021-03-30 DIAGNOSIS — D849 Immunodeficiency, unspecified: Secondary | ICD-10-CM | POA: Diagnosis present

## 2021-03-30 DIAGNOSIS — Z171 Estrogen receptor negative status [ER-]: Secondary | ICD-10-CM

## 2021-03-30 DIAGNOSIS — Z881 Allergy status to other antibiotic agents status: Secondary | ICD-10-CM

## 2021-03-30 DIAGNOSIS — R918 Other nonspecific abnormal finding of lung field: Secondary | ICD-10-CM

## 2021-03-30 DIAGNOSIS — J9602 Acute respiratory failure with hypercapnia: Secondary | ICD-10-CM | POA: Diagnosis not present

## 2021-03-30 DIAGNOSIS — J189 Pneumonia, unspecified organism: Secondary | ICD-10-CM

## 2021-03-30 DIAGNOSIS — D6959 Other secondary thrombocytopenia: Secondary | ICD-10-CM | POA: Diagnosis present

## 2021-03-30 DIAGNOSIS — F419 Anxiety disorder, unspecified: Secondary | ICD-10-CM | POA: Diagnosis present

## 2021-03-30 DIAGNOSIS — J984 Other disorders of lung: Secondary | ICD-10-CM

## 2021-03-30 DIAGNOSIS — R509 Fever, unspecified: Secondary | ICD-10-CM

## 2021-03-30 DIAGNOSIS — E440 Moderate protein-calorie malnutrition: Secondary | ICD-10-CM | POA: Insufficient documentation

## 2021-03-30 DIAGNOSIS — I700 Atherosclerosis of aorta: Secondary | ICD-10-CM | POA: Diagnosis present

## 2021-03-30 DIAGNOSIS — Z923 Personal history of irradiation: Secondary | ICD-10-CM

## 2021-03-30 DIAGNOSIS — I5022 Chronic systolic (congestive) heart failure: Secondary | ICD-10-CM | POA: Diagnosis present

## 2021-03-30 DIAGNOSIS — A419 Sepsis, unspecified organism: Principal | ICD-10-CM | POA: Diagnosis present

## 2021-03-30 DIAGNOSIS — E871 Hypo-osmolality and hyponatremia: Secondary | ICD-10-CM | POA: Diagnosis present

## 2021-03-30 DIAGNOSIS — R471 Dysarthria and anarthria: Secondary | ICD-10-CM | POA: Diagnosis present

## 2021-03-30 DIAGNOSIS — K219 Gastro-esophageal reflux disease without esophagitis: Secondary | ICD-10-CM | POA: Diagnosis present

## 2021-03-30 DIAGNOSIS — I110 Hypertensive heart disease with heart failure: Secondary | ICD-10-CM | POA: Diagnosis present

## 2021-03-30 DIAGNOSIS — R590 Localized enlarged lymph nodes: Secondary | ICD-10-CM

## 2021-03-30 DIAGNOSIS — D649 Anemia, unspecified: Secondary | ICD-10-CM | POA: Diagnosis present

## 2021-03-30 DIAGNOSIS — Z6831 Body mass index (BMI) 31.0-31.9, adult: Secondary | ICD-10-CM

## 2021-03-30 DIAGNOSIS — R627 Adult failure to thrive: Secondary | ICD-10-CM | POA: Diagnosis present

## 2021-03-30 DIAGNOSIS — Z79899 Other long term (current) drug therapy: Secondary | ICD-10-CM

## 2021-03-30 DIAGNOSIS — R7402 Elevation of levels of lactic acid dehydrogenase (LDH): Secondary | ICD-10-CM | POA: Diagnosis present

## 2021-03-30 DIAGNOSIS — J9561 Intraoperative hemorrhage and hematoma of a respiratory system organ or structure complicating a respiratory system procedure: Secondary | ICD-10-CM | POA: Diagnosis not present

## 2021-03-30 DIAGNOSIS — I428 Other cardiomyopathies: Secondary | ICD-10-CM | POA: Diagnosis present

## 2021-03-30 DIAGNOSIS — C773 Secondary and unspecified malignant neoplasm of axilla and upper limb lymph nodes: Secondary | ICD-10-CM | POA: Diagnosis present

## 2021-03-30 DIAGNOSIS — Z888 Allergy status to other drugs, medicaments and biological substances status: Secondary | ICD-10-CM

## 2021-03-30 DIAGNOSIS — Y848 Other medical procedures as the cause of abnormal reaction of the patient, or of later complication, without mention of misadventure at the time of the procedure: Secondary | ICD-10-CM | POA: Diagnosis not present

## 2021-03-30 DIAGNOSIS — D6481 Anemia due to antineoplastic chemotherapy: Secondary | ICD-10-CM | POA: Diagnosis present

## 2021-03-30 HISTORY — DX: Thrombocytopenia, unspecified: D69.6

## 2021-03-30 LAB — COMPREHENSIVE METABOLIC PANEL
ALT: 21 U/L (ref 0–44)
AST: 61 U/L — ABNORMAL HIGH (ref 15–41)
Albumin: 2.9 g/dL — ABNORMAL LOW (ref 3.5–5.0)
Alkaline Phosphatase: 50 U/L (ref 38–126)
Anion gap: 6 (ref 5–15)
BUN: 9 mg/dL (ref 8–23)
CO2: 27 mmol/L (ref 22–32)
Calcium: 8.3 mg/dL — ABNORMAL LOW (ref 8.9–10.3)
Chloride: 97 mmol/L — ABNORMAL LOW (ref 98–111)
Creatinine, Ser: 0.62 mg/dL (ref 0.44–1.00)
GFR, Estimated: >60 mL/min (ref >60)
Glucose, Bld: 102 mg/dL — ABNORMAL HIGH (ref 70–99)
Potassium: 3.7 mmol/L (ref 3.5–5.1)
Sodium: 130 mmol/L — ABNORMAL LOW (ref 135–145)
Total Bilirubin: 0.6 mg/dL (ref 0.3–1.2)
Total Protein: 9.1 g/dL — ABNORMAL HIGH (ref 6.5–8.1)

## 2021-03-30 LAB — URINALYSIS, ROUTINE W REFLEX MICROSCOPIC
Bacteria, UA: NONE SEEN
Bilirubin Urine: NEGATIVE
Glucose, UA: NEGATIVE mg/dL
Hgb urine dipstick: NEGATIVE
Ketones, ur: NEGATIVE mg/dL
Leukocytes,Ua: NEGATIVE
Nitrite: NEGATIVE
Protein, ur: 100 mg/dL — AB
Specific Gravity, Urine: 1.021 (ref 1.005–1.030)
pH: 6 (ref 5.0–8.0)

## 2021-03-30 LAB — CBC WITH DIFFERENTIAL/PLATELET
Abs Immature Granulocytes: 0.1 10*3/uL — ABNORMAL HIGH (ref 0.00–0.07)
Basophils Absolute: 0 10*3/uL (ref 0.0–0.1)
Basophils Relative: 0 %
Eosinophils Absolute: 0.2 10*3/uL (ref 0.0–0.5)
Eosinophils Relative: 3 %
HCT: 31.4 % — ABNORMAL LOW (ref 36.0–46.0)
Hemoglobin: 10 g/dL — ABNORMAL LOW (ref 12.0–15.0)
Immature Granulocytes: 1 %
Lymphocytes Relative: 17 %
Lymphs Abs: 1.3 10*3/uL (ref 0.7–4.0)
MCH: 26.2 pg (ref 26.0–34.0)
MCHC: 31.8 g/dL (ref 30.0–36.0)
MCV: 82.2 fL (ref 80.0–100.0)
Monocytes Absolute: 1.2 10*3/uL — ABNORMAL HIGH (ref 0.1–1.0)
Monocytes Relative: 15 %
Neutro Abs: 5.1 10*3/uL (ref 1.7–7.7)
Neutrophils Relative %: 64 %
Platelets: 121 10*3/uL — ABNORMAL LOW (ref 150–400)
RBC: 3.82 MIL/uL — ABNORMAL LOW (ref 3.87–5.11)
RDW: 16 % — ABNORMAL HIGH (ref 11.5–15.5)
WBC: 7.9 10*3/uL (ref 4.0–10.5)
nRBC: 0 % (ref 0.0–0.2)

## 2021-03-30 LAB — RESP PANEL BY RT-PCR (FLU A&B, COVID) ARPGX2
Influenza A by PCR: NEGATIVE
Influenza B by PCR: NEGATIVE
SARS Coronavirus 2 by RT PCR: NEGATIVE

## 2021-03-30 LAB — GLUCOSE, CAPILLARY: Glucose-Capillary: 87 mg/dL (ref 70–99)

## 2021-03-30 LAB — LACTIC ACID, PLASMA
Lactic Acid, Venous: 1.2 mmol/L (ref 0.5–1.9)
Lactic Acid, Venous: 0.9 mmol/L (ref 0.5–1.9)

## 2021-03-30 LAB — D-DIMER, QUANTITATIVE: D-Dimer, Quant: 4.24 ug/mL-FEU — ABNORMAL HIGH (ref 0.00–0.50)

## 2021-03-30 LAB — PROTIME-INR
INR: 1.1 (ref 0.8–1.2)
Prothrombin Time: 14.5 seconds (ref 11.4–15.2)

## 2021-03-30 LAB — BRAIN NATRIURETIC PEPTIDE: B Natriuretic Peptide: 27.5 pg/mL (ref 0.0–100.0)

## 2021-03-30 IMAGING — CR DG CHEST 2V
2 series · 2 of 2 positions shown · non-contrast
Comparison: [DATE]

CLINICAL DATA: Assess for sepsis

EXAM:
CHEST - 2 VIEW

[w chest lat]
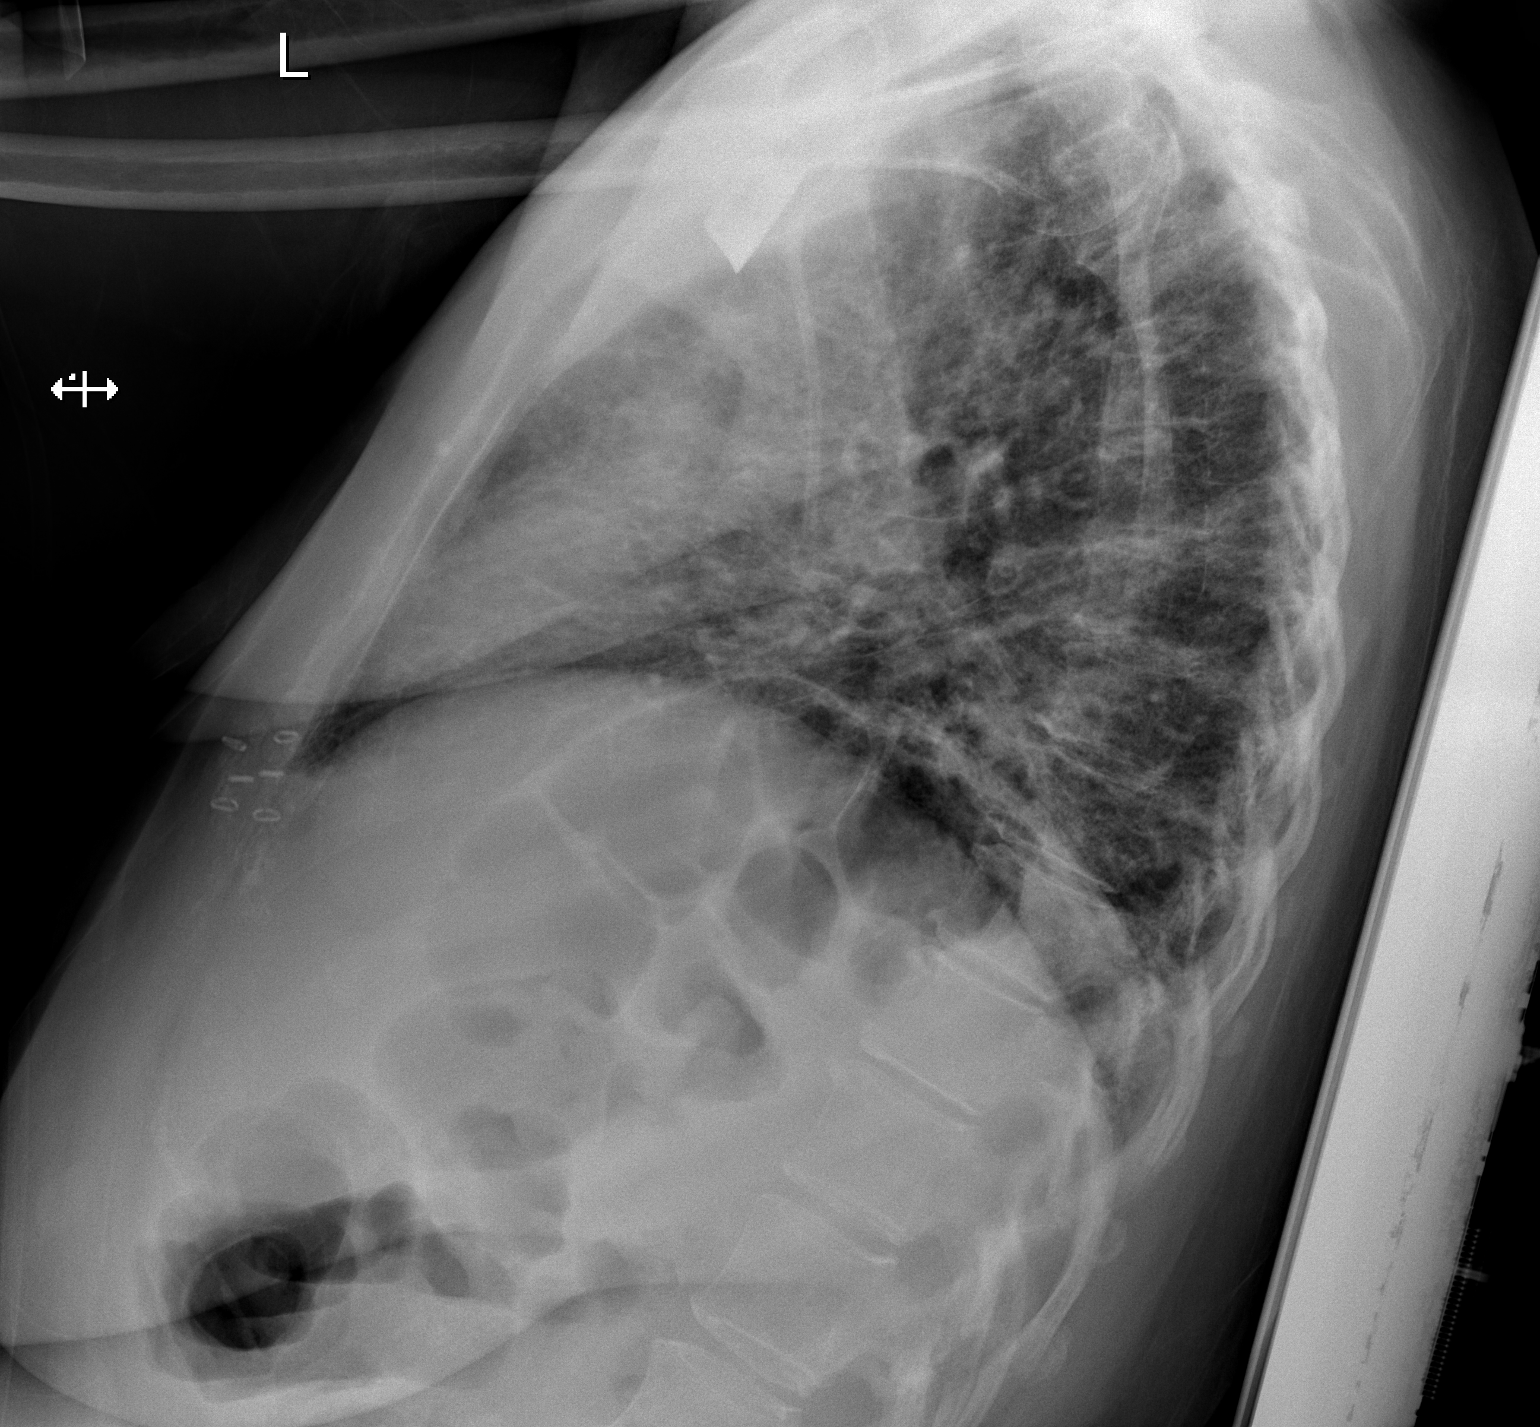

[x chest ap]
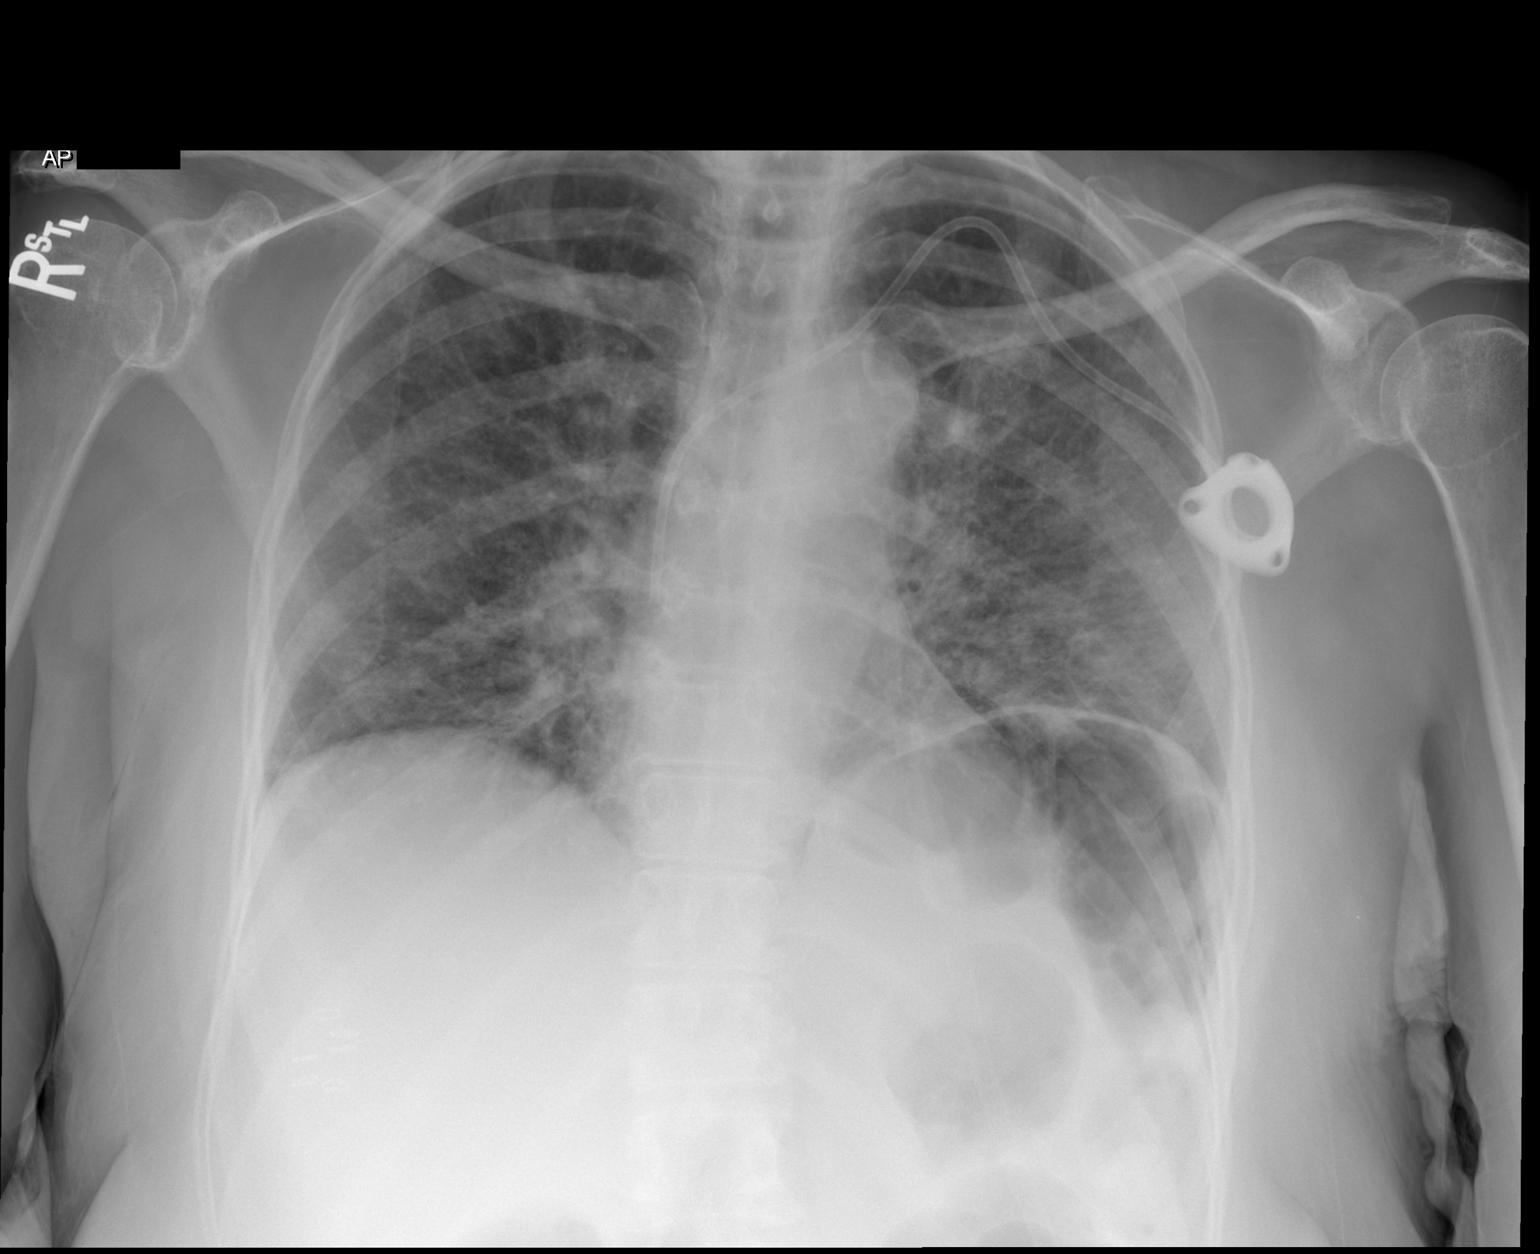

[2 of 2 positions shown; findings below may reference images not displayed]

FINDINGS: The mediastinal contour and cardiac silhouette are stable. Left
central venous line is identified unchanged. There is pulmonary
edema. There is no focal pneumonia or pleural effusion. Bony
structures are stable.
IMPRESSION: Pulmonary edema.

## 2021-03-30 IMAGING — CT CT ANGIO CHEST
2 of 6 series · 18 of 36 positions shown · IV contrast (OMNIPAQUE 350)
Comparison: Chest CT dated [DATE]

CLINICAL DATA: Shortness of breath

EXAM:
CT ANGIOGRAPHY CHEST WITH CONTRAST
TECHNIQUE: Multidetector CT imaging of the chest was performed using the
standard protocol during bolus administration of intravenous
contrast. Multiplanar CT image reconstructions and MIPs were
obtained to evaluate the vascular anatomy.
CONTRAST:  80mL OMNIPAQUE IOHEXOL 350 MG/ML SOLN

[Series 5: thins · axial · 0.67mm/px · z∈[+1312,+1540]mm · 17 of 258 slices shown]
[im 15/258  lung]
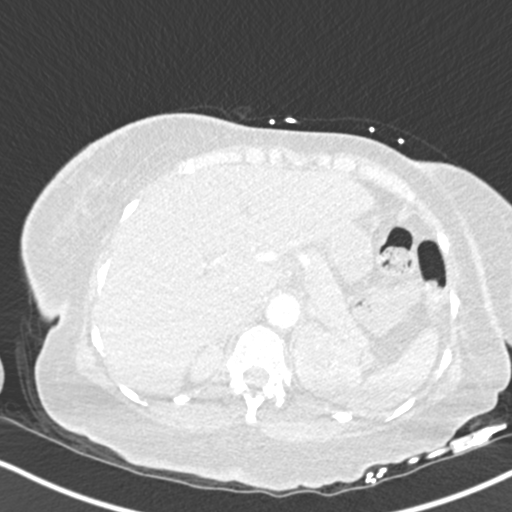
[im 29/258  mediastinal]
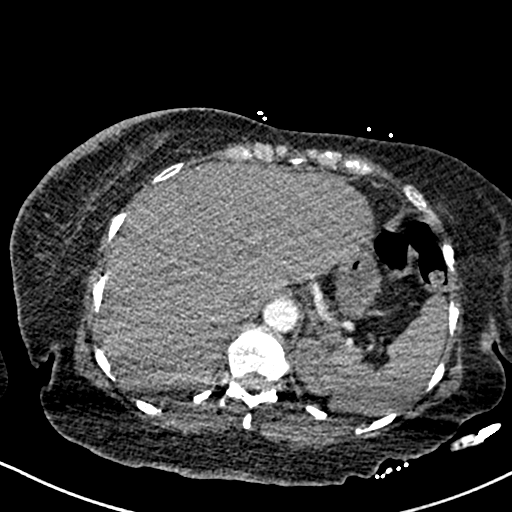
[im 43/258  lung]
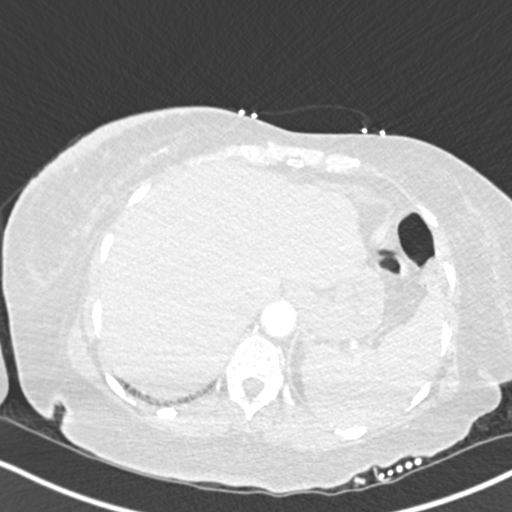
[im 58/258  mediastinal]
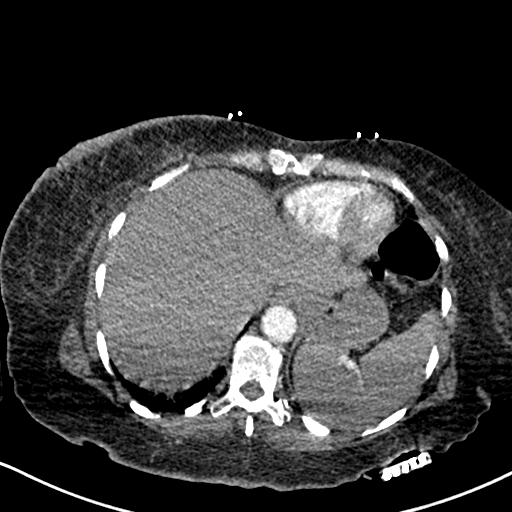
[im 72/258  lung]
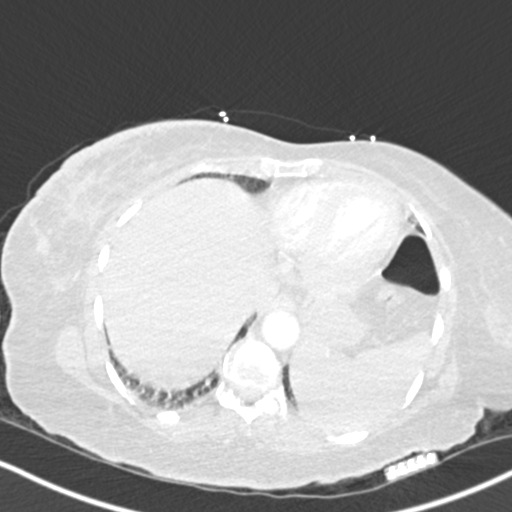
[im 86/258  mediastinal]
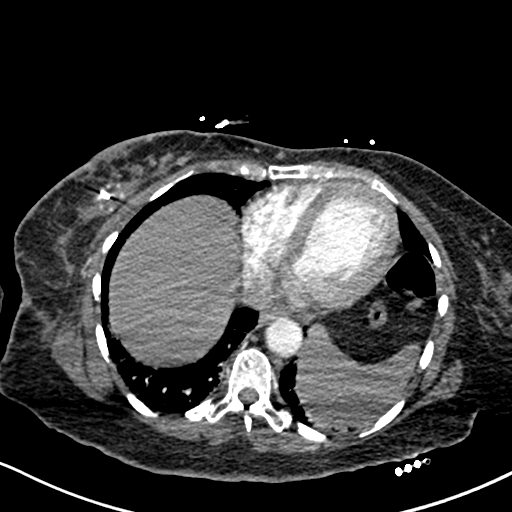
[im 100/258  lung]
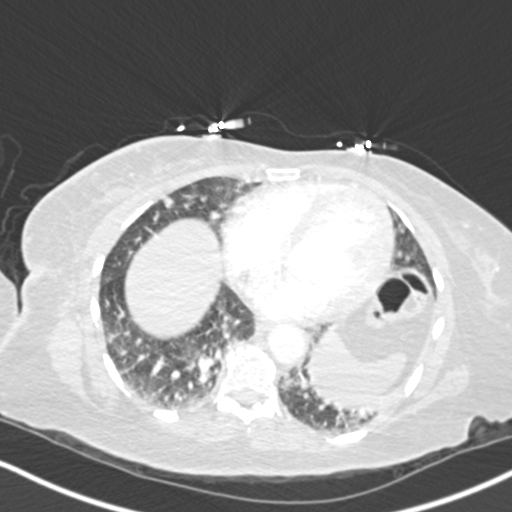
[im 115/258  mediastinal]
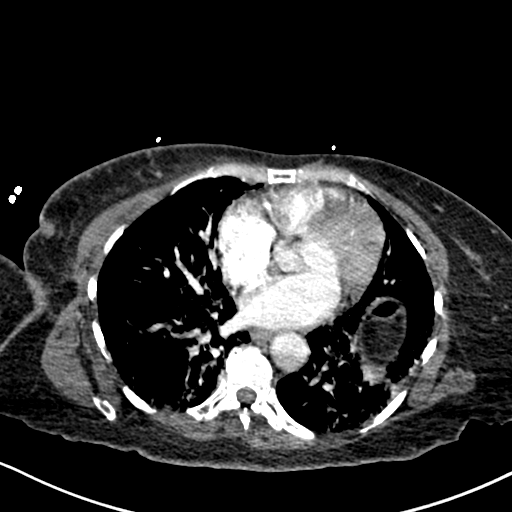
[im 129/258  lung]
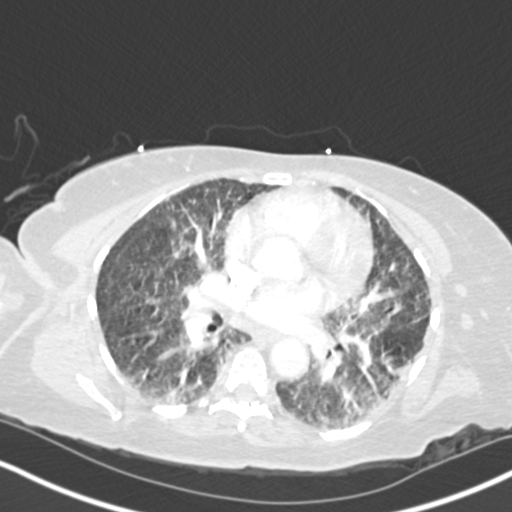
[im 143/258  mediastinal]
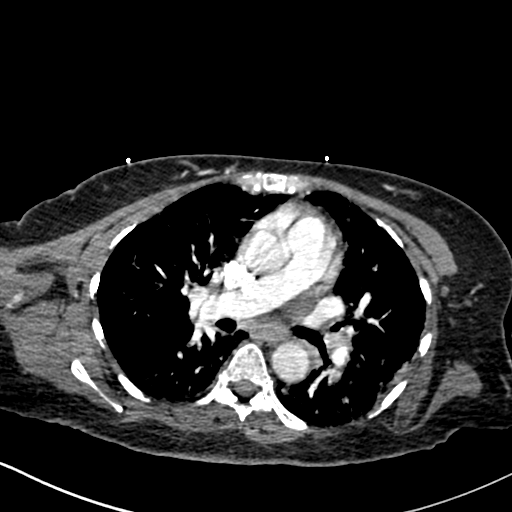
[im 158/258  lung]
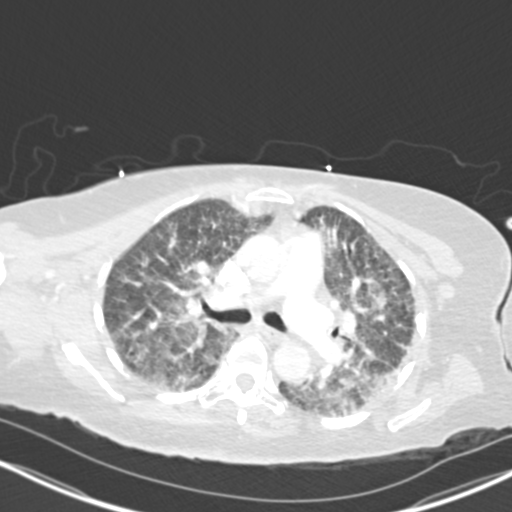
[im 172/258  mediastinal]
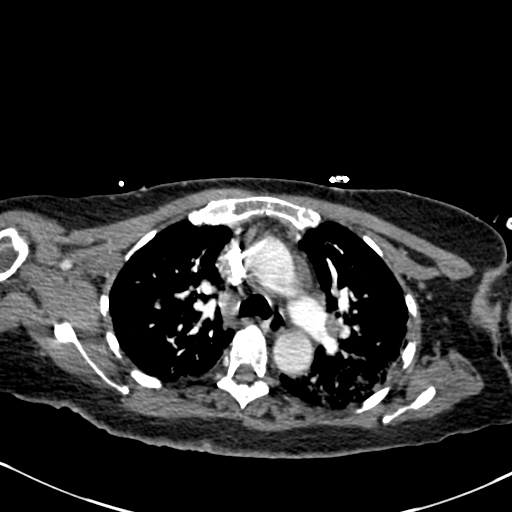
[im 186/258  lung]
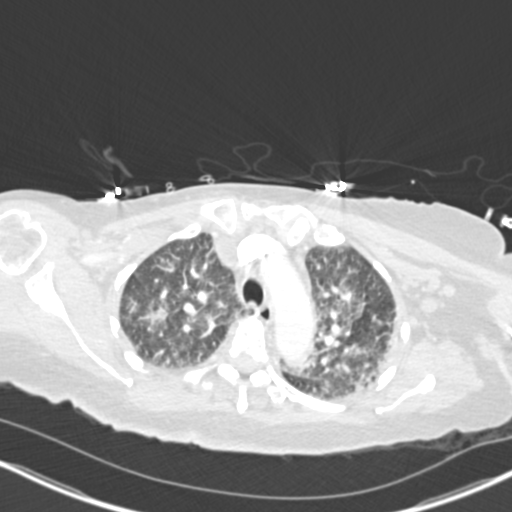
[im 200/258  mediastinal]
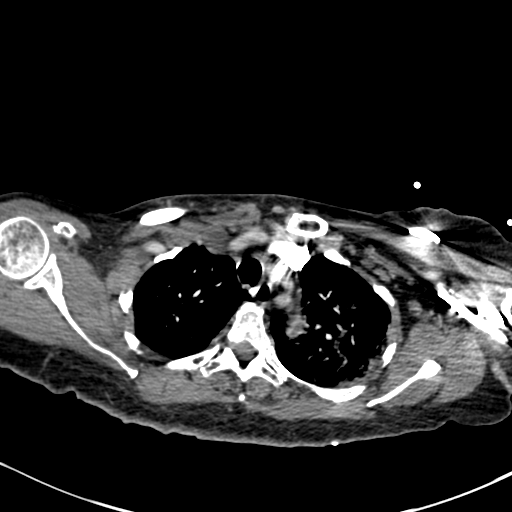
[im 215/258  lung]
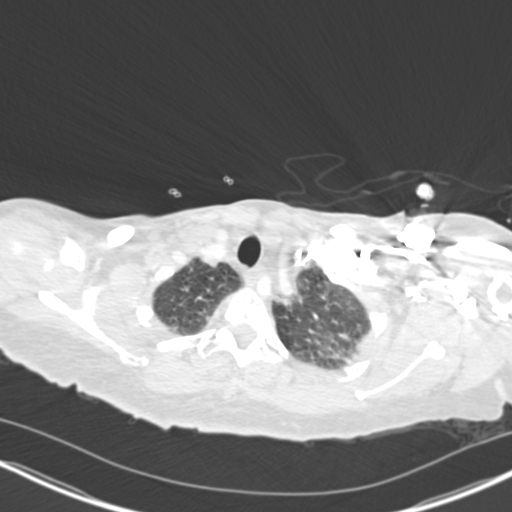
[im 229/258  mediastinal]
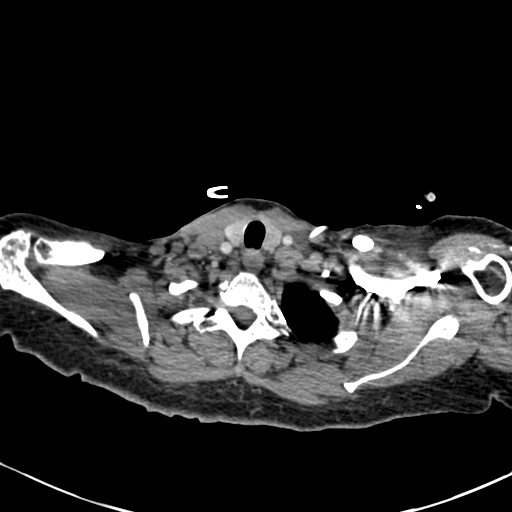
[im 243/258  lung]
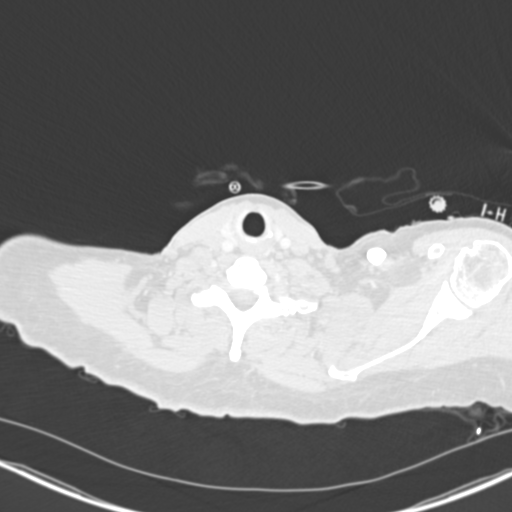

[Series 7: coronal mpr · coronal · 0.61mm/px · 1 of 120 slices shown]
[im 60/120  mediastinal]
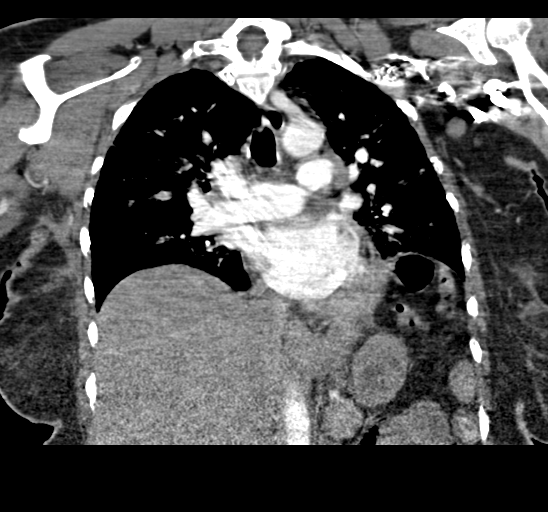

[18 of 36 positions shown; findings below may reference images not displayed]

FINDINGS: Cardiovascular: Adequate contrast opacification of the pulmonary
arteries normal heart size. No pericardial effusion. No evidence of
central pulmonary embolus. Limited evaluation of the segmental and
subsegmental pulmonary arteries due to streak artifact related to
arm position. Minimal atherosclerotic disease of the thoracic aorta.

Mediastinum/Nodes: Mildly enlarged mediastinal lymph nodes.
Reference subcarinal lymph node measuring 1.3 cm in short axis on
series 4, image 57.

Lungs/Pleura: Diffuse bilateral ground-glass opacities with more
focal ground-glass opacities of the upper lobes and areas of
interlobular septal thickening. No pleural effusion or pneumothorax.

Upper Abdomen: No acute abnormality. Skin thickening of the right
breast.

Musculoskeletal: No chest wall abnormality. No acute or significant
osseous findings.

Review of the MIP images confirms the above findings.
IMPRESSION: 1. No evidence of central pulmonary embolus. Limited evaluation of
the segmental and subsegmental pulmonary arteries due to streak
artifact related to arm position.
2. Diffuse bilateral ground-glass opacities, differential includes
pulmonary edema or infectious process including viral pneumonia.
3. Mildly enlarged mediastinal lymph nodes, likely reactive.

## 2021-03-30 MED ORDER — ONDANSETRON HCL 4 MG PO TABS
4.0000 mg | ORAL_TABLET | Freq: Four times a day (QID) | ORAL | Status: DC | PRN
  Administered 2021-04-08: 4 mg via ORAL
  Filled 2021-03-30: qty 1

## 2021-03-30 MED ORDER — ACETAMINOPHEN 650 MG RE SUPP
650.0000 mg | Freq: Four times a day (QID) | RECTAL | Status: DC | PRN
  Administered 2021-04-06: 650 mg via RECTAL
  Filled 2021-03-30: qty 1

## 2021-03-30 MED ORDER — CARVEDILOL 3.125 MG PO TABS
3.1250 mg | ORAL_TABLET | Freq: Two times a day (BID) | ORAL | Status: DC
  Administered 2021-03-30 – 2021-03-31 (×3): 3.125 mg via ORAL
  Filled 2021-03-30 (×3): qty 1

## 2021-03-30 MED ORDER — SODIUM CHLORIDE 0.9 % IV SOLN
2.0000 g | Freq: Once | INTRAVENOUS | Status: AC
  Administered 2021-03-30: 2 g via INTRAVENOUS
  Filled 2021-03-30: qty 2

## 2021-03-30 MED ORDER — SENNOSIDES-DOCUSATE SODIUM 8.6-50 MG PO TABS: 1.0000 | ORAL_TABLET | Freq: Every evening | ORAL | Status: DC | PRN

## 2021-03-30 MED ORDER — SODIUM CHLORIDE 0.9 % IV SOLN
2.0000 g | INTRAVENOUS | Status: DC
  Administered 2021-03-31 – 2021-04-03 (×4): 2 g via INTRAVENOUS
  Filled 2021-03-30 (×4): qty 20

## 2021-03-30 MED ORDER — ENOXAPARIN SODIUM 40 MG/0.4ML IJ SOSY
40.0000 mg | PREFILLED_SYRINGE | INTRAMUSCULAR | Status: DC
  Administered 2021-03-30 – 2021-04-09 (×11): 40 mg via SUBCUTANEOUS
  Filled 2021-03-30 (×11): qty 0.4

## 2021-03-30 MED ORDER — SODIUM CHLORIDE 0.9 % IV SOLN
500.0000 mg | INTRAVENOUS | Status: DC
  Administered 2021-03-30 – 2021-04-02 (×4): 500 mg via INTRAVENOUS
  Filled 2021-03-30 (×4): qty 500

## 2021-03-30 MED ORDER — ONDANSETRON HCL 4 MG/2ML IJ SOLN
4.0000 mg | Freq: Four times a day (QID) | INTRAMUSCULAR | Status: DC | PRN
  Administered 2021-04-06 – 2021-04-08 (×3): 4 mg via INTRAVENOUS
  Filled 2021-03-30 (×3): qty 2

## 2021-03-30 MED ORDER — SODIUM CHLORIDE 0.9% FLUSH
3.0000 mL | Freq: Two times a day (BID) | INTRAVENOUS | Status: DC
  Administered 2021-03-30 – 2021-04-15 (×23): 3 mL via INTRAVENOUS

## 2021-03-30 MED ORDER — IOHEXOL 350 MG/ML SOLN
80.0000 mL | Freq: Once | INTRAVENOUS | Status: AC | PRN
  Administered 2021-03-30: 80 mL via INTRAVENOUS

## 2021-03-30 MED ORDER — ALBUTEROL SULFATE (2.5 MG/3ML) 0.083% IN NEBU
2.5000 mg | INHALATION_SOLUTION | RESPIRATORY_TRACT | Status: DC | PRN
  Administered 2021-04-01: 2.5 mg via RESPIRATORY_TRACT
  Filled 2021-03-30 (×2): qty 3

## 2021-03-30 MED ORDER — SACUBITRIL-VALSARTAN 24-26 MG PO TABS
1.0000 | ORAL_TABLET | Freq: Two times a day (BID) | ORAL | Status: DC
  Administered 2021-03-30 – 2021-04-15 (×26): 1 via ORAL
  Filled 2021-03-30 (×34): qty 1

## 2021-03-30 MED ORDER — GABAPENTIN 300 MG PO CAPS
300.0000 mg | ORAL_CAPSULE | Freq: Two times a day (BID) | ORAL | Status: DC
  Administered 2021-03-30 – 2021-04-04 (×10): 300 mg via ORAL
  Filled 2021-03-30 (×10): qty 1

## 2021-03-30 MED ORDER — ACETAMINOPHEN 325 MG PO TABS
650.0000 mg | ORAL_TABLET | Freq: Four times a day (QID) | ORAL | Status: DC | PRN
  Administered 2021-03-31 – 2021-04-15 (×16): 650 mg via ORAL
  Filled 2021-03-30 (×18): qty 2

## 2021-03-30 MED ORDER — SODIUM CHLORIDE 0.9 % IV SOLN: INTRAVENOUS | Status: AC

## 2021-03-30 NOTE — ED Provider Notes (Signed)
Emergency Medicine Provider Triage Evaluation Note  Marie Church , a 65 y.o. female  was evaluated in triage.  Pt complains of sob.  Review of Systems  Positive: Fever, fatigue, cough, sob, congestion Negative: N/v/d, dysuria  Physical Exam  BP (!) 169/99 (BP Location: Left Arm)   Pulse (!) 124   Temp 100.2 F (37.9 C) (Oral)   Resp 20   SpO2 94%  Gen:   Awake, ill appearing Resp:  Normal effort  MSK:   Moves extremities without difficulty  Other:  tachycardic  Medical Decision Making  Medically screening exam initiated at 2:40 PM.  Appropriate orders placed.  Marie Church was informed that the remainder of the evaluation will be completed by another provider, this initial triage assessment does not replace that evaluation, and the importance of remaining in the ED until their evaluation is complete.  Pt with hx of breast CA currently receiving radiation here with SOB.  Pt felt sick x 3 days, increase SOB, fatigue, and having fever.  Was seen 2 days ago for similar, was given zpak for suspect pna vs pneumonitis.     Marie Moras, PA-C 03/30/21 1442    Church, [name], MD 03/30/21 [phone]

## 2021-03-30 NOTE — ED Provider Notes (Signed)
[facility] DEPT Provider Note   CSN: [id number] Arrival date & time: 03/30/21  1401     History Chief Complaint  Patient presents with   Pneumonia    Marie Church is a 65 y.o. female.  The history is provided by the patient and medical records. No language interpreter was used.  Pneumonia This is a new problem. The current episode started more than 2 days ago. The problem occurs constantly. The problem has been gradually worsening. Associated symptoms include chest pain and shortness of breath. Pertinent negatives include no abdominal pain and no headaches. Nothing aggravates the symptoms. Nothing relieves the symptoms. She has tried nothing for the symptoms. The treatment provided no relief.      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   Hypertension     Patient Active Problem List   Diagnosis Date Noted   Acute on chronic respiratory failure with hypoxia ([place]) 10/08/2020   Acute on chronic systolic CHF (congestive heart failure) ([place]) 10/08/2020   Hypoxia 10/03/2020   Hypertensive urgency 10/03/2020   Shortness of breath 10/03/2020   Elevated troponin 10/03/2020   Severe protein-calorie malnutrition ([place]) 08/16/2020   Ulcer of right groin, limited to breakdown of skin ([place]) 08/08/2020   Anemia of chronic disease 07/31/2020   GERD without esophagitis 07/31/2020   Type 2 diabetes mellitus without complication, without long-term current use of insulin ([place]) 07/31/2020   Bilateral pneumonia 07/19/2020   Breast cancer ([place]) 04/03/2020   Malignant neoplasm of upper-outer quadrant of right breast in female, estrogen receptor negative ([place]) 03/18/2020   Sepsis ([place]) 03/14/2020   Axillary adenopathy 03/12/2020   Macrocytic anemia 02/29/2020   Physical debility 01/30/2020   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    Difficulty in walking 03/25/2018   Osteoarthritis of knee 03/25/2018   Arthritis 01/28/2018   Essential hypertension 01/28/2018   Bilateral knee pain 12/22/2011   Dizziness 12/22/2011   Eustachian tube dysfunction 12/22/2011   Otalgia 12/22/2011   Sinusitis 12/22/2011   Sore throat 12/22/2011    Past Surgical History:  Procedure Laterality Date   BREAST LUMPECTOMY Right 11/30/2020   RIGHT/LEFT HEART CATH AND CORONARY ANGIOGRAPHY N/A 10/10/2020   Procedure: RIGHT/LEFT HEART CATH AND CORONARY ANGIOGRAPHY;  Surgeon: Jolaine Artist, MD;  Location: [facility];  Service: Cardiovascular;  Laterality: N/A;     OB History   No obstetric history on file.     Family History  Problem Relation Age of Onset   Heart failure Neg Hx    Heart disease Neg Hx    Heart attack Neg Hx     Social History   Tobacco Use   Smoking status: Former    Types: Cigarettes   Smokeless tobacco: Never  Vaping Use   Vaping Use: Never used  Substance Use Topics   Alcohol use: Never   Drug use: Never    Home Medications Prior to Admission medications   Medication Sig Start Date End Date Taking? Authorizing Provider  aspirin 81 MG chewable tablet Chew 1 tablet (81 mg total) by mouth daily. Patient not taking: No sig reported 11/07/20   Croitoru, Mihai, MD  azithromycin (ZITHROMAX Z-PAK) 250 MG tablet Take two tablets po x 1 day, then take one tablet a day for the next 4 days 03/28/21   Lajean Saver, MD  carvedilol (COREG) 3.125  MG tablet Take 1 tablet (3.125 mg total) by mouth 2 (two) times daily with a meal. 01/08/21   Larey Dresser, MD  cefdinir (OMNICEF) 300 MG capsule Take 1 capsule (300 mg total) by mouth 2 (two) times daily. 03/28/21   Lajean Saver, MD  dapagliflozin propanediol (FARXIGA) 10 MG TABS tablet Take 1 tablet (10 mg total) by mouth daily before breakfast. Patient not taking: Reported on 03/28/2021 11/07/20   Croitoru, Mihai, MD  Dexlansoprazole 30 MG capsule Take 30 mg  by mouth daily as needed (for acid reflux).    [provider]  furosemide (LASIX) 40 MG tablet Take 0.5 tablets (20 mg total) by mouth daily. 01/08/21   Larey Dresser, MD  gabapentin (NEURONTIN) 300 MG capsule Take 300 mg by mouth 2 (two) times daily. 03/19/21   [provider]  LORazepam (ATIVAN) 0.5 MG tablet Take 0.5 mg by mouth every 6 (six) hours as needed for anxiety. 03/19/21   [provider]  potassium chloride SA (KLOR-CON) 20 MEQ tablet Take 2 tablets (40 mEq total) by mouth daily. 11/14/20   [place], Maricela Bo, FNP  sacubitril-valsartan (ENTRESTO) 24-26 MG Take 1 tablet by mouth 2 (two) times daily. 10/28/20   Almyra Deforest, PA  spironolactone (ALDACTONE) 25 MG tablet Take 0.5 tablets (12.5 mg total) by mouth daily. 11/07/20   Croitoru, Mihai, MD    Allergies    Doxycycline, Amoxicillin-pot clavulanate, and Lisinopril  Review of Systems   Review of Systems  Constitutional:  Positive for chills, fatigue and fever. Negative for diaphoresis.  HENT:  Positive for congestion.   Eyes:  Negative for visual disturbance.  Respiratory:  Positive for cough, chest tightness and shortness of breath. Negative for wheezing.   Cardiovascular:  Positive for chest pain. Negative for palpitations and leg swelling.  Gastrointestinal:  Positive for nausea. Negative for abdominal pain, constipation, diarrhea and vomiting.  Genitourinary:  Negative for dysuria and flank pain.  Musculoskeletal:  Negative for back pain, neck pain and neck stiffness.  Skin:  Negative for rash and wound.  Neurological:  Negative for dizziness, weakness, light-headedness and headaches.  Psychiatric/Behavioral:  Negative for agitation and confusion.   All other systems reviewed and are negative.  Physical Exam Updated Vital Signs BP (!) 169/99 (BP Location: Left Arm)   Pulse (!) 124   Temp 100.2 F (37.9 C) (Oral)   Resp 20   SpO2 94%   Physical Exam Vitals and nursing note reviewed.   Constitutional:      General: She is not in acute distress.    Appearance: She is well-developed. She is ill-appearing. She is not toxic-appearing or diaphoretic.  HENT:     Head: Normocephalic and atraumatic.     Mouth/Throat:     Mouth: Mucous membranes are moist.  Eyes:     Extraocular Movements: Extraocular movements intact.     Conjunctiva/sclera: Conjunctivae normal.     Pupils: Pupils are equal, round, and reactive to light.  Cardiovascular:     Rate and Rhythm: Regular rhythm. Tachycardia present.     Heart sounds: No murmur heard. Pulmonary:     Effort: Pulmonary effort is normal. No respiratory distress.     Breath sounds: Rhonchi present. No wheezing or rales.  Chest:     Chest wall: No tenderness.  Abdominal:     General: Abdomen is flat.     Palpations: Abdomen is soft.     Tenderness: There is no abdominal tenderness. There is no guarding or  rebound.  Musculoskeletal:        General: No tenderness.     Cervical back: Neck supple. No tenderness.  Skin:    General: Skin is warm and dry.     Capillary Refill: Capillary refill takes less than 2 seconds.     Findings: No erythema.  Neurological:     General: No focal deficit present.     Mental Status: She is alert.  Psychiatric:        Mood and Affect: Mood normal.    ED Results / Procedures / Treatments   Labs (all labs ordered are listed, but only abnormal results are displayed) Labs Reviewed  COMPREHENSIVE METABOLIC PANEL - Abnormal; Notable for the following components:      Result Value   Sodium 130 (*)    Chloride 97 (*)    Glucose, Bld 102 (*)    Calcium 8.3 (*)    Total Protein 9.1 (*)    Albumin 2.9 (*)    AST 61 (*)    All other components within normal limits  CBC WITH DIFFERENTIAL/PLATELET - Abnormal; Notable for the following components:   RBC 3.82 (*)    Hemoglobin 10.0 (*)    HCT 31.4 (*)    RDW 16.0 (*)    Platelets 121 (*)    Monocytes Absolute 1.2 (*)    Abs Immature Granulocytes  0.10 (*)    All other components within normal limits  CULTURE, BLOOD (ROUTINE X 2)  CULTURE, BLOOD (ROUTINE X 2)  RESP PANEL BY RT-PCR (FLU A&B, COVID) ARPGX2  LACTIC ACID, PLASMA  LACTIC ACID, PLASMA  PROTIME-INR  URINALYSIS, ROUTINE W REFLEX MICROSCOPIC  D-DIMER, QUANTITATIVE  BRAIN NATRIURETIC PEPTIDE    EKG EKG Interpretation  Date/Time:  Sunday March 30 2021 15:47:51 EST Ventricular Rate:  113 PR Interval:  154 QRS Duration: 89 QT Interval:  340 QTC Calculation: 467 R Axis:   79 Text Interpretation: Sinus tachycardia Baseline wander in lead(s) V1 V3 When compared to prior, faster rate and more wandering baseline. No STEMI Confirmed by Antony Blackbird [phone]) on 03/30/2021 3:59:15 PM  Radiology DG Chest 2 View  Result Date: 03/30/2021 CLINICAL DATA:  Assess for sepsis EXAM: CHEST - 2 VIEW COMPARISON:  March 28, 2021 FINDINGS: The mediastinal contour and cardiac silhouette are stable. Left central venous line is identified unchanged. There is pulmonary edema. There is no focal pneumonia or pleural effusion. Bony structures are stable. IMPRESSION: Pulmonary edema. Electronically Signed   By: Abelardo Diesel M.D.   On: 03/30/2021 15:20   CT Angio Chest PE W and/or Wo Contrast  Result Date: 03/30/2021 CLINICAL DATA:  Shortness of breath EXAM: CT ANGIOGRAPHY CHEST WITH CONTRAST TECHNIQUE: Multidetector CT imaging of the chest was performed using the standard protocol during bolus administration of intravenous contrast. Multiplanar CT image reconstructions and MIPs were obtained to evaluate the vascular anatomy. CONTRAST:  63mL OMNIPAQUE IOHEXOL 350 MG/ML SOLN COMPARISON:  Chest CT dated Oct 03, 2020 FINDINGS: Cardiovascular: Adequate contrast opacification of the pulmonary arteries normal heart size. No pericardial effusion. No evidence of central pulmonary embolus. Limited evaluation of the segmental and subsegmental pulmonary arteries due to streak artifact related to arm  position. Minimal atherosclerotic disease of the thoracic aorta. Mediastinum/Nodes: Mildly enlarged mediastinal lymph nodes. Reference subcarinal lymph node measuring 1.3 cm in short axis on series 4, image 57. Lungs/Pleura: Diffuse bilateral ground-glass opacities with more focal ground-glass opacities of the upper lobes and areas of interlobular septal thickening. No pleural effusion  or pneumothorax. Upper Abdomen: No acute abnormality. Skin thickening of the right breast. Musculoskeletal: No chest wall abnormality. No acute or significant osseous findings. Review of the MIP images confirms the above findings. IMPRESSION: 1. No evidence of central pulmonary embolus. Limited evaluation of the segmental and subsegmental pulmonary arteries due to streak artifact related to arm position. 2. Diffuse bilateral ground-glass opacities, differential includes pulmonary edema or infectious process including viral pneumonia. 3. Mildly enlarged mediastinal lymph nodes, likely reactive. Electronically Signed   By: Yetta Glassman M.D.   On: 03/30/2021 18:03   CT Abdomen Pelvis W Contrast  Result Date: 03/28/2021 CLINICAL DATA:  Abdominal pain, acute, nonlocalized. EXAM: CT ABDOMEN AND PELVIS WITH CONTRAST TECHNIQUE: Multidetector CT imaging of the abdomen and pelvis was performed using the standard protocol following bolus administration of intravenous contrast. CONTRAST:  20mL OMNIPAQUE IOHEXOL 350 MG/ML SOLN COMPARISON:  10/03/2020. FINDINGS: Lower chest: The distal tip of a right central venous catheter terminates at the cavoatrial junction. The heart is normal in size. There is interstitial prominence and atelectasis at the lung bases. A few scattered pulmonary nodules are noted bilaterally, the largest in the right middle lobe measuring 5 mm, axial image 27. Hepatobiliary: No focal liver abnormality is seen. No gallstones, gallbladder wall thickening, or biliary dilatation. Pancreas: Unremarkable. No pancreatic  ductal dilatation or surrounding inflammatory changes. Spleen: Normal in size without focal abnormality. Adrenals/Urinary Tract: Adrenal glands are unremarkable. Kidneys are normal, without renal calculi, focal lesion, or hydronephrosis. A subcentimeter hypodensity is present in the lower pole of the left kidney which is too small to further characterize. The urinary bladder is within normal limits for degree of distension. Stomach/Bowel: No bowel obstruction, free air or pneumatosis. The stomach is unremarkable. A few scattered diverticula are present along the colon without evidence of diverticulitis. No focal bladder wall thickening is seen. The appendix is within normal limits. Vascular/Lymphatic: Aortic atherosclerosis. No enlarged abdominal or pelvic lymph nodes. Reproductive: The uterus is in-situ and contains a few hyperdense lesions suggesting fibroids. Other: No free fluid in the pelvis. Musculoskeletal: Surgical changes, subcutaneous fat stranding and skin thickening are noted over the right breast, compatible with history of radiation therapy. No suspicious osseous lesion is identified. Degenerative changes are present in the thoracolumbar spine. IMPRESSION: 1. Interstitial prominence at the lung bases which is new from the prior exam and may be associated with edema or pneumonitis. 2. Nonspecific scattered pulmonary nodules at the lung bases. Short-term follow-up is recommended to exclude the possibility of neoplastic process. 3. Postsurgical changes in the right breast with skin thickening, compatible with history of radiation therapy. Electronically Signed   By: Brett Fairy M.D.   On: 03/28/2021 21:45    Procedures Procedures   CRITICAL CARE Performed by: Gwenyth Allegra Kallyn Demarcus Total critical care time: 35 minutes Critical care time was exclusive of separately billable procedures and treating other patients. Critical care was necessary to treat or prevent imminent or life-threatening  deterioration. Critical care was time spent personally by me on the following activities: development of treatment plan with patient and/or surrogate as well as nursing, discussions with consultants, evaluation of patient's response to treatment, examination of patient, obtaining history from patient or surrogate, ordering and performing treatments and interventions, ordering and review of laboratory studies, ordering and review of radiographic studies, pulse oximetry and re-evaluation of patient's condition.   Medications Ordered in ED Medications  azithromycin (ZITHROMAX) 500 mg in sodium chloride 0.9 % 250 mL IVPB (500 mg Intravenous New Bag/Given  03/30/21 1804)  ceFEPIme (MAXIPIME) 2 g in sodium chloride 0.9 % 100 mL IVPB (0 g Intravenous Stopped 03/30/21 1724)  iohexol (OMNIPAQUE) 350 MG/ML injection 80 mL (80 mLs Intravenous Contrast Given 03/30/21 1718)    ED Course  I have reviewed the triage vital signs and the nursing notes.  Pertinent labs & imaging results that were available during my care of the patient were reviewed by me and considered in my medical decision making (see chart for details).    MDM Rules/Calculators/A&P                           Zamari Bonsall is a 65 y.o. female with a past medical history significant for hypertension, diabetes, breast cancer, and recent diagnosis of pneumonia on cefdinir and azithromycin who presents with worsening chest tightness, shortness of breath, cough, and fevers.  According to patient, she was seen several days ago and was found to have suspected pneumonitis for pneumonia.  She does not antibiotics but since yesterday symptoms have been worsening.  She reports some chest tightness and cannot catch her breath.  On arrival, patient was found to be tachycardic, tachypneic, and febrile.  Patient was placed on oxygen for increased work of breathing and patient said that her ox saturations were in the 80s earlier as well.  Patient is now on 2  L nasal cannula to maintain ox saturations.  Patient denies any nausea, vomiting, constipation, diarrhea, or urinary changes.  She reports her legs are nontender nonedematous.  She denies other complaints.  She reportedly went to her oncology team today who sent her here for concern for worsening pneumonia versus other cause of her chest pain and shortness of breath.  On exam, patient does have rhonchi.  Chest and abdomen nontender.  Patient moving all extremities.  Intact pulses.  Patient was very warm to the touch and a rectal temperature confirmed a temperature of 102.4.  As she was febrile, tachycardic, tachypneic, I do feel we need to treat with IV antibiotics.  She is not hypotensive.  We will get screening labs, and her chest x-ray did not show convincing evidence of pneumonia but did show some pulmonary edema.  We will hold on aggressive fluids as she is not hypotensive initially but we will get a PE study after the D-dimer was elevated.  After PE study is completed, dissipate admission for suspected worsening pneumonia and failure of outpatient antibiotics.  I spoke with pharmacy who recommended he regimen of cefepime and azithromycin.  Anticipate admission after work-up.     CT PE study does not show pulmonary embolism but does show pneumonia now.  She is still not hypotensive.  Her imaging also showed possible edema.  Given history of CHF, BNP was added and we will be careful with too much fluids.  Patient will be admitted for further management.  Patient will be called for admission.   Final Clinical Impression(s) / ED Diagnoses Final diagnoses:  Community acquired pneumonia, unspecified laterality     Clinical Impression: 1. Community acquired pneumonia, unspecified laterality     Disposition: Admit  This note was prepared with assistance of Systems analyst. Occasional wrong-word or sound-a-like substitutions may have occurred due to the inherent limitations  of voice recognition software.     Ivis Henneman, Gwenyth Allegra, MD 03/30/21 [phone]

## 2021-03-30 NOTE — ED Triage Notes (Signed)
Per EMS, patient from home, known pneumonia, taking oral abx. C/o increased SOB today. Hx breast cancer. Radiation treatments.  R arm restricted.   90-91% RA 98% 2L [place] 120 HR

## 2021-03-30 NOTE — ED Notes (Signed)
Patient requesting blood draw from port access.

## 2021-03-30 NOTE — H&P (Signed)
History and Physical    Marie Church EXH:[id number] DOB: November 10, 1955 DOA: 03/30/2021  PCP: System, Provider Not In  Patient coming from: Home via EMS  I have personally briefly reviewed patient's old medical records in [place]  Chief Complaint: Shortness of breath  HPI: Marie Church is a 65 y.o. 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  Patient initially came to the ED 11/11 from her radiation treatment for evaluation of cough, fever, weakness.  CXR did not show any evidence of pneumonia.  CT abdomen/pelvis was obtained due to report of abdominal pain.  This showed interstitial prominence at the lung bases new from prior exam and nonspecific scattered pulmonary nodules at the lung bases.  Patient was diagnosed with pneumonia and sent home with prescription for oral cefdinir and azithromycin.  Patient returns due to ongoing symptoms.  She has continued shortness of breath, nonproductive cough with chest congestion, intermittent fever, chills, diaphoresis.  She has had some nausea without emesis.  She denies any dysuria, chest pain, diarrhea, swelling to her lower extremities.  She reports good urine output.  She is feeling dehydrated.  She is on active treatment for breast cancer with Kadcyla, last treatment on 11/2.  Also undergoing radiation treatment.  ED Course:  Initial vitals showed BP 169/99, pulse 124, RR 20-30, temp 100.2 F, SPO2 94% on 2 L supplemental O2 via [place].  T-max was 102.4 F rectally.  Labs show WBC 7.9, hemoglobin 10.0, platelets 121,000, sodium 130, potassium 3.7, bicarb 27, BUN 9, creatinine 0.62, serum glucose 102, lactic acid 1.2, D-dimer 4.24, BNP 27.5.  Urinalysis negative for UTI.  Blood cultures  collected and in process.  SARS-CoV-2 and influenza PCR negative.  2 view chest x-ray suggestive of pulmonary edema.  CTA chest PE study negative for evidence of central pulmonary embolus.  Evaluation limited due to streak artifact.  Diffuse bilateral groundglass opacities seen with mildly enlarged mediastinal lymph nodes, felt to be reactive.  Patient was given IV cefepime and azithromycin.  The hospitalist service was consulted to admit for further evaluation and management.  Review of Systems: All systems reviewed and are negative except as documented in history of present illness above.   Past Medical History:  Diagnosis Date   Breast cancer ([place])    undergoing chemothearpy    DM type 2 (diabetes mellitus, type 2) ([place])    reportedly steroid induced   Hypertension     Past Surgical History:  Procedure Laterality Date   BREAST LUMPECTOMY Right 11/30/2020   RIGHT/LEFT HEART CATH AND CORONARY ANGIOGRAPHY N/A 10/10/2020   Procedure: RIGHT/LEFT HEART CATH AND CORONARY ANGIOGRAPHY;  Surgeon: Jolaine Artist, MD;  Location: [facility];  Service: Cardiovascular;  Laterality: N/A;    Social History:  reports that she has quit smoking. Her smoking use included cigarettes. She has never used smokeless tobacco. She reports that she does not drink alcohol and does not use drugs.  Allergies  Allergen Reactions   Doxycycline Rash   Amoxicillin-Pot Clavulanate Rash   Lisinopril Rash    Family History  Problem Relation Age of Onset   Heart failure Neg Hx    Heart disease Neg Hx    Heart attack Neg Hx      Prior to Admission medications   Medication Sig Start  Date End Date Taking? Authorizing Provider  aspirin 81 MG chewable tablet Chew 1 tablet (81 mg total) by mouth daily. Patient not taking: No sig reported 11/07/20   Croitoru, Mihai, MD  azithromycin (ZITHROMAX Z-PAK) 250 MG tablet Take two tablets po x 1 day, then take one tablet a day for the next 4 days 03/28/21    Lajean Saver, MD  carvedilol (COREG) 3.125 MG tablet Take 1 tablet (3.125 mg total) by mouth 2 (two) times daily with a meal. 01/08/21   Larey Dresser, MD  cefdinir (OMNICEF) 300 MG capsule Take 1 capsule (300 mg total) by mouth 2 (two) times daily. 03/28/21   Lajean Saver, MD  dapagliflozin propanediol (FARXIGA) 10 MG TABS tablet Take 1 tablet (10 mg total) by mouth daily before breakfast. Patient not taking: Reported on 03/28/2021 11/07/20   Croitoru, Mihai, MD  Dexlansoprazole 30 MG capsule Take 30 mg by mouth daily as needed (for acid reflux).    [provider]  furosemide (LASIX) 40 MG tablet Take 0.5 tablets (20 mg total) by mouth daily. 01/08/21   Larey Dresser, MD  gabapentin (NEURONTIN) 300 MG capsule Take 300 mg by mouth 2 (two) times daily. 03/19/21   [provider]  LORazepam (ATIVAN) 0.5 MG tablet Take 0.5 mg by mouth every 6 (six) hours as needed for anxiety. 03/19/21   [provider]  potassium chloride SA (KLOR-CON) 20 MEQ tablet Take 2 tablets (40 mEq total) by mouth daily. 11/14/20   [place], Maricela Bo, FNP  sacubitril-valsartan (ENTRESTO) 24-26 MG Take 1 tablet by mouth 2 (two) times daily. 10/28/20   Almyra Deforest, PA  spironolactone (ALDACTONE) 25 MG tablet Take 0.5 tablets (12.5 mg total) by mouth daily. 11/07/20   Croitoru, Dani Gobble, MD    Physical Exam: Vitals:   03/30/21 1809 03/30/21 1830 03/30/21 1911 03/30/21 2000  BP: (!) 163/92 (!) 175/95  (!) 169/95  Pulse: (!) 110 (!) 108  (!) 107  Resp: 16 (!) 23  (!) 23  Temp:   100.2 F (37.9 C)   TempSrc:   Oral   SpO2: 100% 100%  100%   Constitutional: Resting in bed in the left lateral decubitus position, NAD, calm, comfortable Eyes: PERRL, lids and conjunctivae normal ENMT: Mucous membranes are dry. Posterior pharynx clear of any exudate or lesions.Normal dentition.  Neck: normal, supple, no masses. Respiratory: clear to auscultation bilaterally, no wheezing, no crackles. Normal respiratory  effort while on 2 L O2 via [place]. No accessory muscle use.  Cardiovascular: Regular rate and rhythm, no murmurs / rubs / gallops. No extremity edema. 2+ pedal pulses. Abdomen: no tenderness, no masses palpated. No hepatosplenomegaly. Bowel sounds positive.  Musculoskeletal: no clubbing / cyanosis. No joint deformity upper and lower extremities. Good ROM, no contractures. Normal muscle tone.  Skin: no rashes, lesions, ulcers. No induration Neurologic: CN 2-12 grossly intact. Sensation intact. Strength 5/5 in all 4.  Psychiatric: Normal judgment and insight. Alert and oriented x 3. Normal mood.   Labs on Admission: I have personally reviewed following labs and imaging studies  CBC: Recent Labs  Lab 03/28/21 1732 03/30/21 1439  WBC 7.2 7.9  NEUTROABS 4.0 5.1  HGB 10.2* 10.0*  HCT 32.1* 31.4*  MCV 83.6 82.2  PLT 106* 696*   Basic Metabolic Panel: Recent Labs  Lab 03/28/21 1732 03/30/21 1439  NA 130* 130*  K 3.3* 3.7  CL 96* 97*  CO2 26 27  GLUCOSE 108* 102*  BUN 11 9  CREATININE  0.76 0.62  CALCIUM 8.5* 8.3*   GFR: CrCl cannot be calculated (Unknown ideal weight.). Liver Function Tests: Recent Labs  Lab 03/28/21 1732 03/30/21 1439  AST 70* 61*  ALT 23 21  ALKPHOS 53 50  BILITOT 0.6 0.6  PROT 9.3* 9.1*  ALBUMIN 3.0* 2.9*   Recent Labs  Lab 03/28/21 1732  LIPASE 56*   No results for input(s): AMMONIA in the last 168 hours. Coagulation Profile: Recent Labs  Lab 03/30/21 1439  INR 1.1   Cardiac Enzymes: No results for input(s): CKTOTAL, CKMB, CKMBINDEX, TROPONINI in the last 168 hours. BNP (last 3 results) No results for input(s): PROBNP in the last 8760 hours. HbA1C: No results for input(s): HGBA1C in the last 72 hours. CBG: Recent Labs  Lab 03/28/21 2101  GLUCAP 150*   Lipid Profile: No results for input(s): CHOL, HDL, LDLCALC, TRIG, CHOLHDL, LDLDIRECT in the last 72 hours. Thyroid Function Tests: No results for input(s): TSH, T4TOTAL, FREET4,  T3FREE, THYROIDAB in the last 72 hours. Anemia Panel: No results for input(s): VITAMINB12, FOLATE, FERRITIN, TIBC, IRON, RETICCTPCT in the last 72 hours. Urine analysis:    Component Value Date/Time   COLORURINE YELLOW 03/30/2021 1821   APPEARANCEUR CLEAR 03/30/2021 1821   LABSPEC 1.021 03/30/2021 1821   PHURINE 6.0 03/30/2021 1821   GLUCOSEU NEGATIVE 03/30/2021 1821   HGBUR NEGATIVE 03/30/2021 1821   BILIRUBINUR NEGATIVE 03/30/2021 1821   [place] 03/30/2021 1821   PROTEINUR 100 (A) 03/30/2021 1821   NITRITE NEGATIVE 03/30/2021 1821   LEUKOCYTESUR NEGATIVE 03/30/2021 1821    Radiological Exams on Admission: DG Chest 2 View  Result Date: 03/30/2021 CLINICAL DATA:  Assess for sepsis EXAM: CHEST - 2 VIEW COMPARISON:  March 28, 2021 FINDINGS: The mediastinal contour and cardiac silhouette are stable. Left central venous line is identified unchanged. There is pulmonary edema. There is no focal pneumonia or pleural effusion. Bony structures are stable. IMPRESSION: Pulmonary edema. Electronically Signed   By: Abelardo Diesel M.D.   On: 03/30/2021 15:20   CT Angio Chest PE W and/or Wo Contrast  Result Date: 03/30/2021 CLINICAL DATA:  Shortness of breath EXAM: CT ANGIOGRAPHY CHEST WITH CONTRAST TECHNIQUE: Multidetector CT imaging of the chest was performed using the standard protocol during bolus administration of intravenous contrast. Multiplanar CT image reconstructions and MIPs were obtained to evaluate the vascular anatomy. CONTRAST:  23mL OMNIPAQUE IOHEXOL 350 MG/ML SOLN COMPARISON:  Chest CT dated Oct 03, 2020 FINDINGS: Cardiovascular: Adequate contrast opacification of the pulmonary arteries normal heart size. No pericardial effusion. No evidence of central pulmonary embolus. Limited evaluation of the segmental and subsegmental pulmonary arteries due to streak artifact related to arm position. Minimal atherosclerotic disease of the thoracic aorta. Mediastinum/Nodes: Mildly  enlarged mediastinal lymph nodes. Reference subcarinal lymph node measuring 1.3 cm in short axis on series 4, image 57. Lungs/Pleura: Diffuse bilateral ground-glass opacities with more focal ground-glass opacities of the upper lobes and areas of interlobular septal thickening. No pleural effusion or pneumothorax. Upper Abdomen: No acute abnormality. Skin thickening of the right breast. Musculoskeletal: No chest wall abnormality. No acute or significant osseous findings. Review of the MIP images confirms the above findings. IMPRESSION: 1. No evidence of central pulmonary embolus. Limited evaluation of the segmental and subsegmental pulmonary arteries due to streak artifact related to arm position. 2. Diffuse bilateral ground-glass opacities, differential includes pulmonary edema or infectious process including viral pneumonia. 3. Mildly enlarged mediastinal lymph nodes, likely reactive. Electronically Signed   By: Hosie Poisson.D.  On: 03/30/2021 18:03    EKG: Personally reviewed. Sinus tachycardia, rate 113, motion artifact.  Similar to prior.  Assessment/Plan Principal Problem:   Sepsis due to pneumonia [facility]) Active Problems:   Anemia   Malignant neoplasm of upper-outer quadrant of right breast in female, estrogen receptor negative (HCC)   HFrEF (heart failure with reduced ejection fraction) ([place])   Hyponatremia   Thrombocytopenia (H[facility])   Marie Church is a 65 y.o. 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  Sepsis due to community-acquired pneumonia: Patient presenting with fever, tachypnea, tachycardia in setting of suspected community-acquired pneumonia failing outpatient antibiotic therapy. -Continue empiric IV ceftriaxone and azithromycin -Follow blood  cultures, obtain sputum culture and -Obtain strep pneumonia and Legionella urinary antigens -Start on gentle IV fluid hydration  Hyponatremia: Mild, suspect related to volume depletion.  Started on gentle IV fluid hydration with NS@100  mL/hour overnight.  Recheck labs in AM.  HFrEF/nonischemic cardiomyopathy: TTE 03/03/2021 showed EF improved to 50% compared to previous of 20-25%.  Overall patient appears volume depleted.  Per med rec, patient has not been taking most of her cardiac meds. -Hold Lasix and spironolactone for now, on gentle IV fluid hydration overnight -Restart on Coreg and Entresto  Anemia/thrombocytopenia: Mild, likely chemotherapy related.  Continue to monitor.  Metastatic right-sided breast cancer with axillary node involvement: Follows with [facility] oncology on active treatment with Kadcyla, last on 11/2.  Also undergoing radiation treatment through [facility].  DVT prophylaxis: Lovenox Code Status: Full code, confirmed with patient on admission Family Communication: Discussed with patient's daughter at bedside Disposition Plan: From home, dispo pending clinical progress Consults called: None Level of care: Progressive Admission status:  Status is: Observation  The patient remains OBS appropriate and will d/c before 2 midnights.   Zada Finders MD [facility]  If 7PM-7AM, please contact night-coverage [email or web address]  03/30/2021, 9:37 PM

## 2021-03-30 NOTE — Progress Notes (Signed)
A consult was received from an ED physician for cefepime per pharmacy dosing.  The patient's profile has been reviewed for ht/wt/allergies/indication/available labs.   A one time order has been placed for cefepime 2g.  Further antibiotics/pharmacy consults should be ordered by admitting physician if indicated.                       Thank you, Kara Mead 03/30/2021  4:24 PM

## 2021-03-31 ENCOUNTER — Ambulatory Visit: Payer: Self-pay

## 2021-03-31 ENCOUNTER — Encounter (HOSPITAL_COMMUNITY): Payer: Self-pay | Admitting: Internal Medicine

## 2021-03-31 ENCOUNTER — Observation Stay (HOSPITAL_COMMUNITY): Payer: Self-pay

## 2021-03-31 ENCOUNTER — Ambulatory Visit: Admission: RE | Admit: 2021-03-31 | Payer: Self-pay | Source: Ambulatory Visit

## 2021-03-31 DIAGNOSIS — J9600 Acute respiratory failure, unspecified whether with hypoxia or hypercapnia: Secondary | ICD-10-CM | POA: Diagnosis present

## 2021-03-31 LAB — BASIC METABOLIC PANEL
Anion gap: 8 (ref 5–15)
BUN: 7 mg/dL — ABNORMAL LOW (ref 8–23)
CO2: 28 mmol/L (ref 22–32)
Calcium: 8.2 mg/dL — ABNORMAL LOW (ref 8.9–10.3)
Chloride: 99 mmol/L (ref 98–111)
Creatinine, Ser: 0.45 mg/dL (ref 0.44–1.00)
GFR, Estimated: >60 mL/min (ref >60)
Glucose, Bld: 103 mg/dL — ABNORMAL HIGH (ref 70–99)
Potassium: 3.7 mmol/L (ref 3.5–5.1)
Sodium: 135 mmol/L (ref 135–145)

## 2021-03-31 LAB — CBC
HCT: 29 % — ABNORMAL LOW (ref 36.0–46.0)
Hemoglobin: 9 g/dL — ABNORMAL LOW (ref 12.0–15.0)
MCH: 26 pg (ref 26.0–34.0)
MCHC: 31 g/dL (ref 30.0–36.0)
MCV: 83.8 fL (ref 80.0–100.0)
Platelets: 116 10*3/uL — ABNORMAL LOW (ref 150–400)
RBC: 3.46 MIL/uL — ABNORMAL LOW (ref 3.87–5.11)
RDW: 16.3 % — ABNORMAL HIGH (ref 11.5–15.5)
WBC: 6.7 10*3/uL (ref 4.0–10.5)
nRBC: 0 % (ref 0.0–0.2)

## 2021-03-31 LAB — STREP PNEUMONIAE URINARY ANTIGEN: Strep Pneumo Urinary Antigen: NEGATIVE

## 2021-03-31 LAB — PROCALCITONIN: Procalcitonin: 0.36 ng/mL

## 2021-03-31 IMAGING — DX DG ABDOMEN 1V
2 series · 2 of 2 positions shown · non-contrast
Comparison: None.

CLINICAL DATA: Feeling ill

EXAM:
ABDOMEN - 1 VIEW

[abdomen kub (1 of 2)]
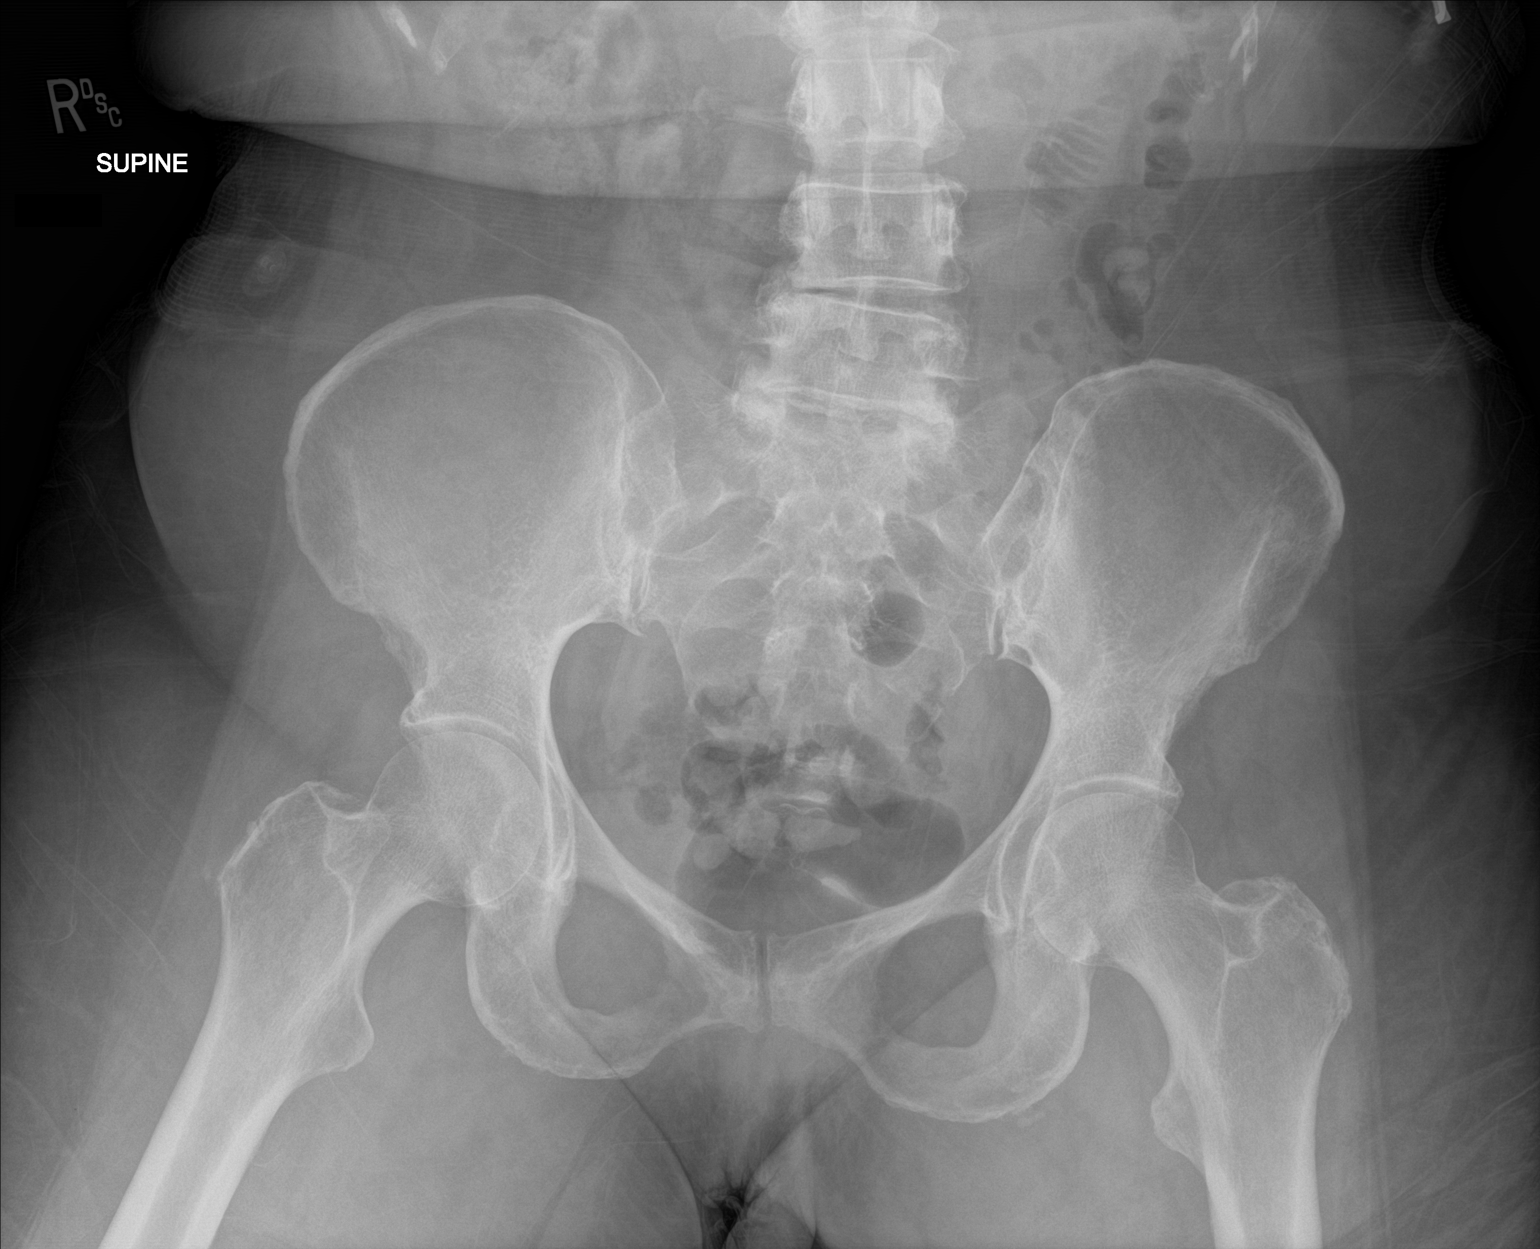

[abdomen kub (2 of 2)]
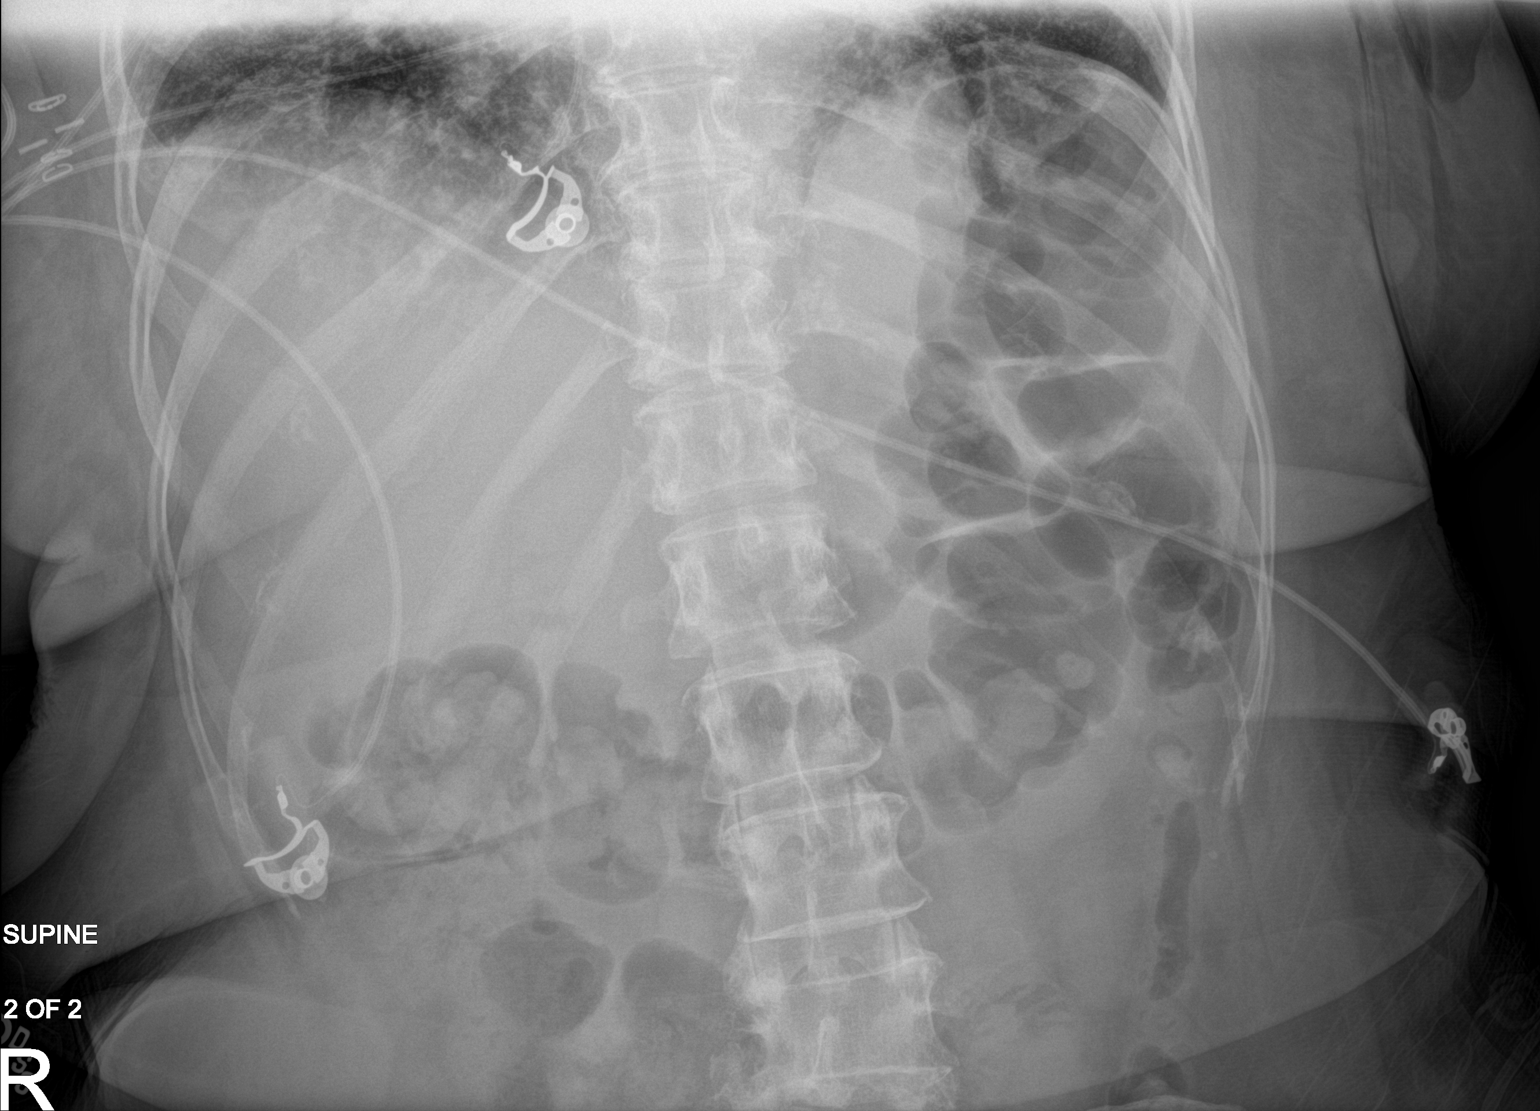

[2 of 2 positions shown; findings below may reference images not displayed]

FINDINGS: Nonobstructive pattern of bowel gas with gas present to the rectum.
No large burden of stool. No free air in the abdomen on supine
radiographs. No radio-opaque calculi or other significant
radiographic abnormality are seen.
IMPRESSION: Nonobstructive pattern of bowel gas with gas present to the rectum.
No large burden of stool. No free air in the abdomen on supine
radiographs.

## 2021-03-31 MED ORDER — CHLORHEXIDINE GLUCONATE CLOTH 2 % EX PADS
6.0000 | MEDICATED_PAD | Freq: Every day | CUTANEOUS | Status: DC
  Administered 2021-03-31 – 2021-04-15 (×16): 6 via TOPICAL

## 2021-03-31 MED ORDER — SODIUM CHLORIDE 0.9% FLUSH
10.0000 mL | INTRAVENOUS | Status: DC | PRN
  Administered 2021-04-05 – 2021-04-11 (×2): 10 mL

## 2021-03-31 MED ORDER — SODIUM CHLORIDE 0.9 % IV SOLN: INTRAVENOUS | Status: DC

## 2021-03-31 MED ORDER — LORAZEPAM 0.5 MG PO TABS
0.2500 mg | ORAL_TABLET | Freq: Two times a day (BID) | ORAL | Status: AC
  Administered 2021-03-31 – 2021-04-01 (×2): 0.25 mg via ORAL
  Filled 2021-03-31 (×2): qty 1

## 2021-03-31 MED ORDER — GUAIFENESIN-DM 100-10 MG/5ML PO SYRP
5.0000 mL | ORAL_SOLUTION | ORAL | Status: DC | PRN
  Administered 2021-03-31 – 2021-04-14 (×25): 5 mL via ORAL
  Filled 2021-03-31 (×25): qty 10

## 2021-03-31 NOTE — Progress Notes (Signed)
PROGRESS NOTE    Marie Church  YJE:[id number] DOB: Nov 28, 1955 DOA: 03/30/2021 PCP: System, Provider Not In    Brief Narrative:  Marie Church is a 65 year old female with past medical history significant for malignant right breast cancer with axillary node involvement on active Kadcyla and radiation, chronic systolic congestive heart failure (EF improved from 20-25% to 50%), type 2 diabetes mellitus, essential hypertension, anemia/thrombocytopenia who presented to [facility] on 11/13 for shortness of breath.  States onset 3 days prior with progressive symptoms.  Nonproductive cough with chest congestion, intermittent fever/chills, diaphoresis.  Also reports some nausea without vomiting.  Denies dysuria, no chest pain, no diarrhea, no lower extremity edema.  Almyra Free seen in ED on 11/11 following her radiation treatment for evaluation of cough, fever and weakness.  Chest x-ray did not show any evidence of pneumonia and CT abdomen/pelvis was obtained due to report of abdominal pain; notable for interstitial prominence of the lung bases new from prior exam and nonspecific scattered pulmonary nodules at the lung bases.  Patient was diagnosed with pneumonia and sent home with a prescription for oral cefdinir and azithromycin.  Patient currently on active treatment for breast cancer with Kadcyla with last treatment on 11/2 and continues on radiation therapy.  In the ED, HR 102.4 F, HR 124, RR 30, BP 169/66, SPO2 94% on 2 L nasal cannula.  Sodium 130, potassium 3.7, chloride 97, CO2 27, glucose 102, BUN 9, creatinine 0.62, AST 61, ALT 21, total bilirubin 0.6.  BNP 27.5, lactic acid 1.1.  WBC 7.9, hemoglobin 10.0, platelets 121.  INR 1.1.  COVID-19 PCR negative.  Influenza A/B PCR negative.  D-dimer elevated 4.24.  Urinalysis unrevealing.  Chest x-ray with pulmonary edema.  CT angiogram chest with no evidence of central pulmonary embolism, limited due to streak artifact, diffuse bilateral groundglass  opacities consistent with pulmonary edema versus infectious process.  EDP started IV cefepime and azithromycin.  Hospitalist service consulted for further evaluation and management of respiratory failure secondary to [facility] with failed outpatient antibiotic treatment.   Assessment & Plan:   Principal Problem:   Sepsis due to pneumonia [facility]) Active Problems:   Anemia   Malignant neoplasm of upper-outer quadrant of right breast in female, estrogen receptor negative (HCC)   HFrEF (heart failure with reduced ejection fraction) (HCC)   Hyponatremia   Thrombocytopenia (HCC)   Acute respiratory failure with hypoxia, POA Sepsis secondary to community acquired pneumonia Presenting to the ED with progressive shortness of breath and nonproductive cough.  Complicated by active chemotherapy/radiation.  Patient meets sepsis criteria on admission with temperature 102.4 admission with heart rate 24, tachypnea, hypoxia requiring 2 L nasal cannula.  CT angiogram chest notable for diffuse groundglass opacities.  Source of infection is pneumonia with suspected atypical versus gram-negative organism. COVID-19 PCR negative.  Influenza A/B PCR negative.  Failed outpatient azithromycin/cefdinir. --Blood cultures x2: Pending --Strep pneumonia/Legionella urinary antigen: Ordered --Azithromycin 500 mg IV every 24 hours x5 days --Ceftriaxone 2 g IV every 24 hours x5 days --Continues oxygen, maintain SPO2 greater than 92%, on 2 L nasal cannula --Ambulatory O2 screen today  Hyponatremia Sodium 130 on admission, suspect etiology hypovolemic hyponatremia in the setting of volume depletion/dehydration. --Na 130>135 --NS at 75 mL/hr x 1 day --repeat BMP in am  Chronic systolic congestive heart failure Nonischemic cardiomyopathy EEG 03/03/2021 showed improved EF to 50% compared to previous at 20-25%.  Overall patient Vonne depleted with hyponatremia as above.  Per medication reconciliation, patient has not been taking  most of her cardiac medications. --Restart carvedilol 3.125 mg BID and Entresto 24-26mg  PO BID --Holding furosemide and spironolactone for now, on gentle IV fluid hydration as above --strict I's and O's and daily weights  Anemia/thrombocytopenia: Etiology likely secondary to chemotherapy.  Stable. --CBC in a.m.  Metastatic right-sided breast cancer with axillary node involvement: Patient follows with [facility] oncology outpatient on active chemotherapy with Kadcyla; last treatment on 11/2.  Currently undergoing radiation treatment through [facility] health radiation oncology. --Continue outpatient follow-up  Abdominal pain, nonspecific: KUB with nonobstructive pattern bowel gas with no large stool burden, no free air.  CT abdomen/pelvis with no bowel obstruction, no free air/pneumatosis, stomach unremarkable, few diverticula without diverticulitis, no focal bladder wall thickening, and appendix within normal limits.  Pulmonary nodules Few scattered pulmonary nodules noted bilaterally largest right middle lobe measuring 5 mm as incidental finding on recent CT abdomen/pelvis. --Given her malignancy history, will need short-term follow-up outpatient to exclude neoplastic process  Weakness/deconditioning: --PT/OT evaluation   DVT prophylaxis: enoxaparin (LOVENOX) injection 40 mg Start: 03/30/21 2200   Code Status: Full Code Family Communication: No family present at bedside this morning  Disposition Plan:  Level of care: Progressive Status is: Observation  The patient remains OBS appropriate and will d/c before 2 midnights.   Consultants:  None  Procedures:  None  Antimicrobials:  Azithromycin 11/13>> Ceftriaxone 11/14>> Cefepime 11/13 - 11/13    Subjective: Patient seen examined bedside, resting comfortably.  Continues with shortness of breath, complaining of abdominal pain.  No other questions or concerns at this time.  Remains on oxygen, which she is not dependent at baseline.   Denies headache, no chest pain, no palpitations, no current fever/chills/night sweats, no congestion.  No acute events overnight per nurse staff.  Objective: Vitals:   03/30/21 2200 03/31/21 0000 03/31/21 0143 03/31/21 0555  BP:   140/76 (!) 165/86  Pulse:   91 97  Resp:  20 16 20   Temp:   99.5 F (37.5 C) 99.8 F (37.7 C)  TempSrc:   Oral Oral  SpO2:   100% 100%  Weight: 76.4 kg     Height: 5' 6.14" (1.68 m)       Intake/Output Summary (Last 24 hours) at 03/31/2021 1228 Last data filed at 03/30/2021 1724 Gross per 24 hour  Intake 100 ml  Output --  Net 100 ml   Filed Weights   03/30/21 2200  Weight: 76.4 kg    Examination:  General exam: Appears calm and comfortable  Respiratory system: Breath sounds slightly decreased bilateral bases, normal [place]ire effort without accessory muscle use, no appreciable wheezes/crackles, on 2 L nasal cannula Cardiovascular system: S1 & S2 heard, RRR. No JVD, murmurs, rubs, gallops or clicks. No pedal edema. Gastrointestinal system: Abdomen is nondistended, soft and nontender. No organomegaly or masses felt. Normal bowel sounds heard. Central nervous system: Alert and oriented. No focal neurological deficits. Extremities: Symmetric 5 x 5 power. Skin: No rashes, lesions or ulcers Psychiatry: Judgement and insight appear normal. Mood & affect appropriate.     Data Reviewed: I have personally reviewed following labs and imaging studies  CBC: Recent Labs  Lab 03/28/21 1732 03/30/21 1439 03/31/21 0349  WBC 7.2 7.9 6.7  NEUTROABS 4.0 5.1  --   HGB 10.2* 10.0* 9.0*  HCT 32.1* 31.4* 29.0*  MCV 83.6 82.2 83.8  PLT 106* 121* 332*   Basic Metabolic Panel: Recent Labs  Lab 03/28/21 1732 03/30/21 1439 03/31/21 0349  NA 130* 130* 135  K 3.3*  3.7 3.7  CL 96* 97* 99  CO2 26 27 28   GLUCOSE 108* 102* 103*  BUN 11 9 7*  CREATININE 0.76 0.62 0.45  CALCIUM 8.5* 8.3* 8.2*   GFR: Estimated Creatinine Clearance: 75.3 mL/min (by C-G  formula based on SCr of 0.45 mg/dL). Liver Function Tests: Recent Labs  Lab 03/28/21 1732 03/30/21 1439  AST 70* 61*  ALT 23 21  ALKPHOS 53 50  BILITOT 0.6 0.6  PROT 9.3* 9.1*  ALBUMIN 3.0* 2.9*   Recent Labs  Lab 03/28/21 1732  LIPASE 56*   No results for input(s): AMMONIA in the last 168 hours. Coagulation Profile: Recent Labs  Lab 03/30/21 1439  INR 1.1   Cardiac Enzymes: No results for input(s): CKTOTAL, CKMB, CKMBINDEX, TROPONINI in the last 168 hours. BNP (last 3 results) No results for input(s): PROBNP in the last 8760 hours. HbA1C: No results for input(s): HGBA1C in the last 72 hours. CBG: Recent Labs  Lab 03/28/21 2101 03/30/21 2158  GLUCAP 150* 87   Lipid Profile: No results for input(s): CHOL, HDL, LDLCALC, TRIG, CHOLHDL, LDLDIRECT in the last 72 hours. Thyroid Function Tests: No results for input(s): TSH, T4TOTAL, FREET4, T3FREE, THYROIDAB in the last 72 hours. Anemia Panel: No results for input(s): VITAMINB12, FOLATE, FERRITIN, TIBC, IRON, RETICCTPCT in the last 72 hours. Sepsis Labs: Recent Labs  Lab 03/28/21 1732 03/30/21 1439 03/30/21 1805 03/31/21 0349  PROCALCITON  --   --   --  0.36  LATICACIDVEN 1.3 1.2 0.9  --     Recent Results (from the past 240 hour(s))  Blood culture (routine x 2)     Status: None (Preliminary result)   Collection Time: 03/28/21  5:32 PM   Specimen: BLOOD  Result Value Ref Range Status   Specimen Description   Final    BLOOD LEFT ANTECUBITAL Performed at [facility], [place] [address] [place], [place] [address]    Special Requests   Final    BOTTLES DRAWN AEROBIC AND ANAEROBIC Blood Culture results may not be optimal due to an inadequate volume of blood received in culture bottles Performed at [place] [address], [place], [place] [address]    Culture   Final    NO GROWTH 3 DAYS Performed at [facility], [place] [address] [place],  [address]     Report Status PENDING  Incomplete  Resp Panel by RT-PCR (Flu A&B, Covid) Nasopharyngeal Swab     Status: None   Collection Time: 03/28/21  5:32 PM   Specimen: Nasopharyngeal Swab; Nasopharyngeal(NP) swabs in vial transport medium  Result Value Ref Range Status   SARS Coronavirus 2 by RT PCR NEGATIVE NEGATIVE Final    Comment: (NOTE) SARS-CoV-2 target nucleic acids are NOT DETECTED.  The SARS-CoV-2 RNA is generally detectable in upper respiratory specimens during the acute phase of infection. The lowest concentration of SARS-CoV-2 viral copies this assay can detect is 138 copies/mL. A negative result does not preclude SARS-Cov-2 infection and should not be used as the sole basis for treatment or other patient management decisions. A negative result may occur with  improper specimen collection/handling, submission of specimen other than nasopharyngeal swab, presence of viral mutation(s) within the areas targeted by this assay, and inadequate number of viral copies(<138 copies/mL). A negative result must be combined with clinical observations, patient history, and epidemiological information. The expected result is Negative.  Fact Sheet for Patients:  EntrepreneurPulse.com.au  Fact Sheet for Healthcare Providers:  IncredibleEmployment.be  This test is no t yet approved or cleared by the [place] and  has been authorized for detection and/or diagnosis of SARS-CoV-2 by FDA under an Emergency Use Authorization (EUA). This EUA will remain  in effect (meaning this test can be used) for the duration of the COVID-19 declaration under Section 564(b)(1) of the Act, 21 U.S.C.section 360bbb-3(b)(1), unless the authorization is terminated  or revoked sooner.       Influenza A by PCR NEGATIVE NEGATIVE Final   Influenza B by PCR NEGATIVE NEGATIVE Final    Comment: (NOTE) The Xpert Xpress SARS-CoV-2/FLU/RSV plus assay is intended as an aid in the  diagnosis of influenza from Nasopharyngeal swab specimens and should not be used as a sole basis for treatment. Nasal washings and aspirates are unacceptable for Xpert Xpress SARS-CoV-2/FLU/RSV testing.  Fact Sheet for Patients: EntrepreneurPulse.com.au  Fact Sheet for Healthcare Providers: IncredibleEmployment.be  This test is not yet approved or cleared by the [place] FDA and has been authorized for detection and/or diagnosis of SARS-CoV-2 by FDA under an Emergency Use Authorization (EUA). This EUA will remain in effect (meaning this test can be used) for the duration of the COVID-19 declaration under Section 564(b)(1) of the Act, 21 U.S.C. section 360bbb-3(b)(1), unless the authorization is terminated or revoked.  Performed at [facility], [place] [address], [place], [place] [address]   Blood culture (routine x 2)     Status: None (Preliminary result)   Collection Time: 03/28/21  5:37 PM   Specimen: BLOOD  Result Value Ref Range Status   Specimen Description   Final    BLOOD BLOOD LEFT WRIST Performed at [place] [address] [place], [place] [address]    Special Requests   Final    BOTTLES DRAWN AEROBIC AND ANAEROBIC Blood Culture results may not be optimal due to an inadequate volume of blood received in culture bottles Performed at [place] [address], [place], [place] [address]    Culture   Final    NO GROWTH 3 DAYS Performed at [facility], [place] [address], [place], [place] [address]    Report Status PENDING  Incomplete  Resp Panel by RT-PCR (Flu A&B, Covid) Nasopharyngeal Swab     Status: None   Collection Time: 03/30/21  2:39 PM   Specimen: Nasopharyngeal Swab; Nasopharyngeal(NP) swabs in vial transport medium  Result Value Ref Range Status   SARS Coronavirus 2 by RT PCR NEGATIVE NEGATIVE Final    Comment: (NOTE) SARS-CoV-2 target nucleic acids  are NOT DETECTED.  The SARS-CoV-2 RNA is generally detectable in upper respiratory specimens during the acute phase of infection. The lowest concentration of SARS-CoV-2 viral copies this assay can detect is 138 copies/mL. A negative result does not preclude SARS-Cov-2 infection and should not be used as the sole basis for treatment or other patient management decisions. A negative result may occur with  improper specimen collection/handling, submission of specimen other than nasopharyngeal swab, presence of viral mutation(s) within the areas targeted by this assay, and inadequate number of viral copies(<138 copies/mL). A negative result must be combined with clinical observations, patient history, and epidemiological information. The expected result is Negative.  Fact Sheet for Patients:  EntrepreneurPulse.com.au  Fact Sheet for Healthcare Providers:  IncredibleEmployment.be  This test is no t yet approved or cleared by the [place] FDA and  has been authorized for detection and/or diagnosis of SARS-CoV-2 by FDA under an Emergency Use Authorization (  EUA). This EUA will remain  in effect (meaning this test can be used) for the duration of the COVID-19 declaration under Section 564(b)(1) of the Act, 21 U.S.C.section 360bbb-3(b)(1), unless the authorization is terminated  or revoked sooner.       Influenza A by PCR NEGATIVE NEGATIVE Final   Influenza B by PCR NEGATIVE NEGATIVE Final    Comment: (NOTE) The Xpert Xpress SARS-CoV-2/FLU/RSV plus assay is intended as an aid in the diagnosis of influenza from Nasopharyngeal swab specimens and should not be used as a sole basis for treatment. Nasal washings and aspirates are unacceptable for Xpert Xpress SARS-CoV-2/FLU/RSV testing.  Fact Sheet for Patients: EntrepreneurPulse.com.au  Fact Sheet for Healthcare Providers: IncredibleEmployment.be  This test is  not yet approved or cleared by the [place] FDA and has been authorized for detection and/or diagnosis of SARS-CoV-2 by FDA under an Emergency Use Authorization (EUA). This EUA will remain in effect (meaning this test can be used) for the duration of the COVID-19 declaration under Section 564(b)(1) of the Act, 21 U.S.C. section 360bbb-3(b)(1), unless the authorization is terminated or revoked.  Performed at [facility], [place] [address] [place], [place] [address]   Culture, blood (Routine x 2)     Status: None (Preliminary result)   Collection Time: 03/30/21  3:20 PM   Specimen: BLOOD  Result Value Ref Range Status   Specimen Description   Final    BLOOD PORTA CATH Performed at [place] [address] [place], [place] [address]    Special Requests   Final    BOTTLES DRAWN AEROBIC AND ANAEROBIC Blood Culture adequate volume Performed at [place] [address] [place], [place] [address]    Culture   Final    NO GROWTH < 12 HOURS Performed at [facility] [address], [place], [place] [address]    Report Status PENDING  Incomplete  Culture, blood (Routine x 2)     Status: None (Preliminary result)   Collection Time: 03/30/21  3:27 PM   Specimen: BLOOD  Result Value Ref Range Status   Specimen Description   Final    BLOOD LEFT ANTECUBITAL Performed at [place] [address], [place], [place] [address]    Special Requests   Final    BOTTLES DRAWN AEROBIC AND ANAEROBIC Blood Culture results may not be optimal due to an excessive volume of blood received in culture bottles Performed at [place] [address], [place], [place] [address]    Culture   Final    NO GROWTH < 12 HOURS Performed at [place] [address], [place], [place] [address]    Report Status PENDING  Incomplete         Radiology Studies: DG Chest 2  View  Result Date: 03/30/2021 CLINICAL DATA:  Assess for sepsis EXAM: CHEST - 2 VIEW COMPARISON:  March 28, 2021 FINDINGS: The mediastinal contour and cardiac silhouette are stable. Left central venous line is identified unchanged. There is pulmonary edema. There is no focal pneumonia or pleural effusion. Bony structures are stable. IMPRESSION: Pulmonary edema. Electronically Signed   By: Abelardo Diesel M.D.   On: 03/30/2021 15:20   DG Abd 1 View  Result Date: 03/31/2021 CLINICAL DATA:  Feeling ill EXAM: ABDOMEN - 1 VIEW COMPARISON:  None. FINDINGS: Nonobstructive pattern of bowel gas with gas present to the rectum. No large burden of stool. No free air  in the abdomen on supine radiographs. No radio-opaque calculi or other significant radiographic abnormality are seen. IMPRESSION: Nonobstructive pattern of bowel gas with gas present to the rectum. No large burden of stool. No free air in the abdomen on supine radiographs. Electronically Signed   By: Delanna Ahmadi M.D.   On: 03/31/2021 09:12   CT Angio Chest PE W and/or Wo Contrast  Result Date: 03/30/2021 CLINICAL DATA:  Shortness of breath EXAM: CT ANGIOGRAPHY CHEST WITH CONTRAST TECHNIQUE: Multidetector CT imaging of the chest was performed using the standard protocol during bolus administration of intravenous contrast. Multiplanar CT image reconstructions and MIPs were obtained to evaluate the vascular anatomy. CONTRAST:  53mL OMNIPAQUE IOHEXOL 350 MG/ML SOLN COMPARISON:  Chest CT dated Oct 03, 2020 FINDINGS: Cardiovascular: Adequate contrast opacification of the pulmonary arteries normal heart size. No pericardial effusion. No evidence of central pulmonary embolus. Limited evaluation of the segmental and subsegmental pulmonary arteries due to streak artifact related to arm position. Minimal atherosclerotic disease of the thoracic aorta. Mediastinum/Nodes: Mildly enlarged mediastinal lymph nodes. Reference subcarinal lymph node measuring 1.3 cm in  short axis on series 4, image 57. Lungs/Pleura: Diffuse bilateral ground-glass opacities with more focal ground-glass opacities of the upper lobes and areas of interlobular septal thickening. No pleural effusion or pneumothorax. Upper Abdomen: No acute abnormality. Skin thickening of the right breast. Musculoskeletal: No chest wall abnormality. No acute or significant osseous findings. Review of the MIP images confirms the above findings. IMPRESSION: 1. No evidence of central pulmonary embolus. Limited evaluation of the segmental and subsegmental pulmonary arteries due to streak artifact related to arm position. 2. Diffuse bilateral ground-glass opacities, differential includes pulmonary edema or infectious process including viral pneumonia. 3. Mildly enlarged mediastinal lymph nodes, likely reactive. Electronically Signed   By: Yetta Glassman M.D.   On: 03/30/2021 18:03        Scheduled Meds:  carvedilol  3.125 mg Oral BID WC   Chlorhexidine Gluconate Cloth  6 each Topical Daily   enoxaparin (LOVENOX) injection  40 mg Subcutaneous Q24H   gabapentin  300 mg Oral BID   sacubitril-valsartan  1 tablet Oral BID   sodium chloride flush  3 mL Intravenous Q12H   Continuous Infusions:  azithromycin 500 mg (03/30/21 1804)   cefTRIAXone (ROCEPHIN)  IV Stopped (03/31/21 1145)     LOS: 0 days    Time spent: 42 minutes spent on chart review, discussion with nursing staff, consultants, updating family and interview/physical exam; more than 50% of that time was spent in counseling and/or coordination of care.    Hallie Ertl J [place] DO [facility] Available via Epic secure chat 7am-7pm After these hours, please refer to coverage provider listed on [email or web address] 03/31/2021, 12:28 PM

## 2021-04-01 ENCOUNTER — Ambulatory Visit: Payer: Self-pay

## 2021-04-01 ENCOUNTER — Ambulatory Visit
Admission: RE | Admit: 2021-04-01 | Discharge: 2021-04-01 | Disposition: A | Discharge status: Home / Self Care | Payer: Self-pay | Source: Ambulatory Visit | Attending: Radiation Oncology | Admitting: Radiation Oncology

## 2021-04-01 LAB — LEGIONELLA PNEUMOPHILA SEROGP 1 UR AG: L. pneumophila Serogp 1 Ur Ag: NEGATIVE

## 2021-04-01 LAB — BASIC METABOLIC PANEL
Anion gap: 5 (ref 5–15)
BUN: 7 mg/dL — ABNORMAL LOW (ref 8–23)
CO2: 29 mmol/L (ref 22–32)
Calcium: 8 mg/dL — ABNORMAL LOW (ref 8.9–10.3)
Chloride: 99 mmol/L (ref 98–111)
Creatinine, Ser: 0.43 mg/dL — ABNORMAL LOW (ref 0.44–1.00)
GFR, Estimated: >60 mL/min (ref >60)
Glucose, Bld: 106 mg/dL — ABNORMAL HIGH (ref 70–99)
Potassium: 3.5 mmol/L (ref 3.5–5.1)
Sodium: 133 mmol/L — ABNORMAL LOW (ref 135–145)

## 2021-04-01 LAB — MAGNESIUM: Magnesium: 2 mg/dL (ref 1.7–2.4)

## 2021-04-01 LAB — GLUCOSE, CAPILLARY
Glucose-Capillary: 117 mg/dL — ABNORMAL HIGH (ref 70–99)
Glucose-Capillary: 127 mg/dL — ABNORMAL HIGH (ref 70–99)

## 2021-04-01 MED ORDER — BOOST PLUS PO LIQD
237.0000 mL | Freq: Three times a day (TID) | ORAL | Status: DC
  Administered 2021-04-01 – 2021-04-13 (×19): 237 mL via ORAL
  Filled 2021-04-01 (×38): qty 237

## 2021-04-01 MED ORDER — CARVEDILOL 12.5 MG PO TABS
12.5000 mg | ORAL_TABLET | Freq: Two times a day (BID) | ORAL | Status: DC
  Administered 2021-04-01 – 2021-04-15 (×26): 12.5 mg via ORAL
  Filled 2021-04-01 (×27): qty 1

## 2021-04-01 MED ORDER — ADULT MULTIVITAMIN W/MINERALS CH
1.0000 | ORAL_TABLET | Freq: Every day | ORAL | Status: DC
  Administered 2021-04-01 – 2021-04-15 (×13): 1 via ORAL
  Filled 2021-04-01 (×13): qty 1

## 2021-04-01 MED ORDER — HYDRALAZINE HCL 25 MG PO TABS
25.0000 mg | ORAL_TABLET | Freq: Four times a day (QID) | ORAL | Status: DC | PRN
  Administered 2021-04-13: 05:00:00 25 mg via ORAL
  Filled 2021-04-01: qty 1

## 2021-04-01 MED ORDER — POTASSIUM CHLORIDE CRYS ER 20 MEQ PO TBCR
40.0000 meq | EXTENDED_RELEASE_TABLET | Freq: Once | ORAL | Status: AC
  Administered 2021-04-01: 40 meq via ORAL
  Filled 2021-04-01: qty 2

## 2021-04-01 MED ORDER — LORAZEPAM 0.5 MG PO TABS
0.5000 mg | ORAL_TABLET | Freq: Four times a day (QID) | ORAL | Status: AC | PRN
  Administered 2021-04-01 – 2021-04-02 (×2): 0.5 mg via ORAL
  Filled 2021-04-01 (×2): qty 1

## 2021-04-01 NOTE — Progress Notes (Signed)
Notified MD of patients temperature of 101.1 F and how patient was having anxiety and requesting something for it.

## 2021-04-01 NOTE — Progress Notes (Signed)
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  States onset 3 days prior with progressive symptoms.  Nonproductive cough with chest congestion, intermittent fever/chills, diaphoresis.  Also reports some nausea without vomiting.  Denies dysuria, no chest pain, no diarrhea, no lower extremity edema.  Almyra Free seen in ED on 11/11 following her radiation treatment for evaluation of cough, fever and weakness.  Chest x-ray did not show any evidence of pneumonia and CT abdomen/pelvis was obtained due to report of abdominal pain; notable for interstitial prominence of the lung bases new from prior exam and nonspecific scattered pulmonary nodules at the lung bases.  Patient was diagnosed with pneumonia and sent home with a prescription for oral cefdinir and azithromycin.  Patient currently on active treatment for breast cancer with Kadcyla with last treatment on 11/2 and continues on radiation therapy.  In the ED, HR 102.4 F, HR 124, RR 30, BP 169/66, SPO2 94% on 2 L nasal cannula.  Sodium 130, potassium 3.7, chloride 97, CO2 27, glucose 102, BUN 9, creatinine 0.62, AST 61, ALT 21, total bilirubin 0.6.  BNP 27.5, lactic acid 1.1.  WBC 7.9, hemoglobin 10.0, platelets 121.  INR 1.1.  COVID-19 PCR negative.  Influenza A/B PCR negative.  D-dimer elevated 4.24.  Urinalysis unrevealing.  Chest x-ray with pulmonary edema.  CT angiogram chest with no evidence of central pulmonary embolism, limited due to streak artifact, diffuse bilateral groundglass  opacities consistent with pulmonary edema versus infectious process.  EDP started IV cefepime and azithromycin.  Hospitalist service consulted for further evaluation and management of respiratory failure secondary to [facility] with failed outpatient antibiotic treatment.   Assessment & Plan:   Principal Problem:   Sepsis due to pneumonia [facility]) Active Problems:   Anemia   Malignant neoplasm of upper-outer quadrant of right breast in female, estrogen receptor negative (HCC)   HFrEF (heart failure with reduced ejection fraction) (HCC)   Hyponatremia   Thrombocytopenia (HCC)   Acute respiratory failure ([place])   Acute respiratory failure with hypoxia, POA Sepsis secondary to community acquired pneumonia Presenting to the ED with progressive shortness of breath and nonproductive cough.  Complicated by active chemotherapy/radiation.  Patient meets sepsis criteria on admission with temperature 102.4 admission with heart rate 24, tachypnea, hypoxia requiring 2 L nasal cannula.  CT angiogram chest notable for diffuse groundglass opacities.  Source of infection is pneumonia with suspected atypical versus gram-negative organism. COVID-19 PCR negative.  Influenza A/B PCR negative.  Failed outpatient azithromycin/cefdinir.  Strep pneumo urinary antigen: Negative --Blood cultures x2: no growth <12h --Legionella urinary antigen: pending --Azithromycin 500 mg IV q24h x 5 days --Ceftriaxone 2 g IV q24h x 5 days --Continues on oxygen, maintain SPO2 > 92%, on 2 L nasal cannula --Ambulatory O2 screen in am  Hyponatremia Sodium 130 on admission, suspect etiology hypovolemic hyponatremia in the setting of volume depletion/dehydration.  Given IV fluid on admission. --Na 130>133 --repeat BMP in am  Chronic systolic congestive heart failure Nonischemic cardiomyopathy EEG 03/03/2021 showed improved EF to 50% compared to previous at 20-25%.  Overall patient Vonne depleted with hyponatremia as  above.  Per medication  reconciliation, patient has not been taking most of her cardiac medications. --Increase carvedilol to 12.5mg  BID --continue Entresto 24-26mg  PO BID --Holding furosemide and spironolactone for now, on gentle IV fluid hydration as above --strict I's and O's and daily weights  Anemia/thrombocytopenia: Etiology likely secondary to chemotherapy.  Stable. --CBC in a.m.  Metastatic right-sided breast cancer with axillary node involvement: Patient follows with [facility] oncology outpatient on active chemotherapy with Kadcyla; last treatment on 11/2.  Currently undergoing radiation treatment through [facility] radiation oncology. --Continue outpatient follow-up  Abdominal pain, nonspecific: KUB with nonobstructive pattern bowel gas with no large stool burden, no free air.  CT abdomen/pelvis with no bowel obstruction, no free air/pneumatosis, stomach unremarkable, few diverticula without diverticulitis, no focal bladder wall thickening, and appendix within normal limits.  Pulmonary nodules Few scattered pulmonary nodules noted bilaterally largest right middle lobe measuring 5 mm as incidental finding on recent CT abdomen/pelvis. --Given her malignancy history, will need short-term follow-up outpatient to exclude neoplastic process  Weakness/deconditioning: --PT recommending home health, OT recommending SNF --TOC for evaluation   DVT prophylaxis: enoxaparin (LOVENOX) injection 40 mg Start: 03/30/21 2200   Code Status: Full Code Family Communication: No family present at bedside this morning  Disposition Plan:  Level of care: Telemetry Status is: Inpatient  Remains inpatient appropriate because: Remains on supplemental oxygen, which is not her baseline, IV antibiotics, adjusting antihypertensives for poorly controlled blood pressure   Consultants:  None  Procedures:  None  Antimicrobials:  Azithromycin 11/13>> Ceftriaxone 11/14>> Cefepime 11/13 - 11/13    Subjective: Patient seen  examined bedside, resting comfortably.  Sitting in bedside chair.  Son present.  Shortness of breath slowly improving, continues with nonproductive cough.  Feels weak and fatigued.  Seen by PT with recommendation of home health, although OT recommends SNF.   No other questions or concerns at this time.  Denies headache, no chest pain, no palpitations, no current fever/chills/night sweats, no congestion.  No acute events overnight per nurse staff.  Objective: Vitals:   04/01/21 0533 04/01/21 0600 04/01/21 1000 04/01/21 1417  BP: (!) 183/93 (!) 182/84 (!) 141/74 134/77  Pulse: (!) 110 (!) 105 95 80  Resp:   17 (!) 24  Temp: 99.3 F (37.4 C)  99.2 F (37.3 C) 98.7 F (37.1 C)  TempSrc: Oral  Oral Oral  SpO2: 92%  96% 98%  Weight:      Height:       No intake or output data in the 24 hours ending 04/01/21 1502  Filed Weights   03/30/21 2200 04/01/21 0500  Weight: 76.4 kg 76.8 kg    Examination:  General exam: Appears calm and comfortable  Respiratory system: Breath sounds slightly decreased bilateral bases, normal respiratory effort without accessory muscle use, no appreciable wheezes/crackles, on 2 L nasal cannula Cardiovascular system: S1 & S2 heard, RRR. No JVD, murmurs, rubs, gallops or clicks. No pedal edema. Gastrointestinal system: Abdomen is nondistended, soft and nontender. No organomegaly or masses felt. Normal bowel sounds heard. Central nervous system: Alert and oriented. No focal neurological deficits. Extremities: Symmetric 5 x 5 power. Skin: No rashes, lesions or ulcers Psychiatry: Judgement and insight appear normal. Mood & affect appropriate.     Data Reviewed: I have personally reviewed following labs and imaging studies  CBC: Recent Labs  Lab 03/28/21 1732 03/30/21 1439 03/31/21 0349  WBC 7.2 7.9 6.7  NEUTROABS 4.0 5.1  --   HGB 10.2* 10.0* 9.0*  HCT 32.1*  31.4* 29.0*  MCV 83.6 82.2 83.8  PLT 106* 121* 128*   Basic Metabolic Panel: Recent Labs  Lab  03/28/21 1732 03/30/21 1439 03/31/21 0349 04/01/21 0307  NA 130* 130* 135 133*  K 3.3* 3.7 3.7 3.5  CL 96* 97* 99 99  CO2 26 27 28 29   GLUCOSE 108* 102* 103* 106*  BUN 11 9 7* 7*  CREATININE 0.76 0.62 0.45 0.43*  CALCIUM 8.5* 8.3* 8.2* 8.0*  MG  --   --   --  2.0   GFR: Estimated Creatinine Clearance: 75.6 mL/min (A) (by C-G formula based on SCr of 0.43 mg/dL (L)). Liver Function Tests: Recent Labs  Lab 03/28/21 1732 03/30/21 1439  AST 70* 61*  ALT 23 21  ALKPHOS 53 50  BILITOT 0.6 0.6  PROT 9.3* 9.1*  ALBUMIN 3.0* 2.9*   Recent Labs  Lab 03/28/21 1732  LIPASE 56*   No results for input(s): AMMONIA in the last 168 hours. Coagulation Profile: Recent Labs  Lab 03/30/21 1439  INR 1.1   Cardiac Enzymes: No results for input(s): CKTOTAL, CKMB, CKMBINDEX, TROPONINI in the last 168 hours. BNP (last 3 results) No results for input(s): PROBNP in the last 8760 hours. HbA1C: No results for input(s): HGBA1C in the last 72 hours. CBG: Recent Labs  Lab 03/28/21 2101 03/30/21 2158 04/01/21 0734 04/01/21 1143  GLUCAP 150* 87 117* 127*   Lipid Profile: No results for input(s): CHOL, HDL, LDLCALC, TRIG, CHOLHDL, LDLDIRECT in the last 72 hours. Thyroid Function Tests: No results for input(s): TSH, T4TOTAL, FREET4, T3FREE, THYROIDAB in the last 72 hours. Anemia Panel: No results for input(s): VITAMINB12, FOLATE, FERRITIN, TIBC, IRON, RETICCTPCT in the last 72 hours. Sepsis Labs: Recent Labs  Lab 03/28/21 1732 03/30/21 1439 03/30/21 1805 03/31/21 0349  PROCALCITON  --   --   --  0.36  LATICACIDVEN 1.3 1.2 0.9  --     Recent Results (from the past 240 hour(s))  Blood culture (routine x 2)     Status: None (Preliminary result)   Collection Time: 03/28/21  5:32 PM   Specimen: BLOOD  Result Value Ref Range Status   Specimen Description   Final    BLOOD LEFT ANTECUBITAL Performed at [facility], [place] [address] [place], [place] [address]     Special Requests   Final    BOTTLES DRAWN AEROBIC AND ANAEROBIC Blood Culture results may not be optimal due to an inadequate volume of blood received in culture bottles Performed at [place] [address], [place], [place] [address]    Culture   Final    NO GROWTH 3 DAYS Performed at [facility], [place] [address] [place],  [address]    Report Status PENDING  Incomplete  Resp Panel by RT-PCR (Flu A&B, Covid) Nasopharyngeal Swab     Status: None   Collection Time: 03/28/21  5:32 PM   Specimen: Nasopharyngeal Swab; Nasopharyngeal(NP) swabs in vial transport medium  Result Value Ref Range Status   SARS Coronavirus 2 by RT PCR NEGATIVE NEGATIVE Final    Comment: (NOTE) SARS-CoV-2 target nucleic acids are NOT DETECTED.  The SARS-CoV-2 RNA is generally detectable in upper respiratory specimens during the acute phase of infection. The lowest concentration of SARS-CoV-2 viral copies this assay can detect is 138 copies/mL. A negative result does not preclude SARS-Cov-2 infection and should not be used as the sole basis for treatment or other patient management decisions. A negative result may  occur with  improper specimen collection/handling, submission of specimen other than nasopharyngeal swab, presence of viral mutation(s) within the areas targeted by this assay, and inadequate number of viral copies(<138 copies/mL). A negative result must be combined with clinical observations, patient history, and epidemiological information. The expected result is Negative.  Fact Sheet for Patients:  EntrepreneurPulse.com.au  Fact Sheet for Healthcare Providers:  IncredibleEmployment.be  This test is no t yet approved or cleared by the [place] FDA and  has been authorized for detection and/or diagnosis of SARS-CoV-2 by FDA under an Emergency Use Authorization (EUA). This EUA will remain  in effect (meaning this test can  be used) for the duration of the COVID-19 declaration under Section 564(b)(1) of the Act, 21 U.S.C.section 360bbb-3(b)(1), unless the authorization is terminated  or revoked sooner.       Influenza A by PCR NEGATIVE NEGATIVE Final   Influenza B by PCR NEGATIVE NEGATIVE Final    Comment: (NOTE) The Xpert Xpress SARS-CoV-2/FLU/RSV plus assay is intended as an aid in the diagnosis of influenza from Nasopharyngeal swab specimens and should not be used as a sole basis for treatment. Nasal washings and aspirates are unacceptable for Xpert Xpress SARS-CoV-2/FLU/RSV testing.  Fact Sheet for Patients: EntrepreneurPulse.com.au  Fact Sheet for Healthcare Providers: IncredibleEmployment.be  This test is not yet approved or cleared by the [place] FDA and has been authorized for detection and/or diagnosis of SARS-CoV-2 by FDA under an Emergency Use Authorization (EUA). This EUA will remain in effect (meaning this test can be used) for the duration of the COVID-19 declaration under Section 564(b)(1) of the Act, 21 U.S.C. section 360bbb-3(b)(1), unless the authorization is terminated or revoked.  Performed at [facility], [place] [address], [place], [place] [address]   Blood culture (routine x 2)     Status: None (Preliminary result)   Collection Time: 03/28/21  5:37 PM   Specimen: BLOOD  Result Value Ref Range Status   Specimen Description   Final    BLOOD BLOOD LEFT WRIST Performed at [place] [address] [place], [place] [address]    Special Requests   Final    BOTTLES DRAWN AEROBIC AND ANAEROBIC Blood Culture results may not be optimal due to an inadequate volume of blood received in culture bottles Performed at [place] [address] [place], [place] [address]    Culture   Final    NO GROWTH 3 DAYS Performed at [facility], [place] [address], [place], [place]  [address]    Report Status PENDING  Incomplete  Resp Panel by RT-PCR (Flu A&B, Covid) Nasopharyngeal Swab     Status: None   Collection Time: 03/30/21  2:39 PM   Specimen: Nasopharyngeal Swab; Nasopharyngeal(NP) swabs in vial transport medium  Result Value Ref Range Status   SARS Coronavirus 2 by RT PCR NEGATIVE NEGATIVE Final    Comment: (NOTE) SARS-CoV-2 target nucleic acids are NOT DETECTED.  The SARS-CoV-2 RNA is generally detectable in upper respiratory specimens during the acute phase of infection. The lowest concentration of SARS-CoV-2 viral copies this assay can detect is 138 copies/mL. A negative result does not preclude SARS-Cov-2 infection and should not be used as the sole basis for treatment or other patient management decisions. A negative result may occur with  improper specimen collection/handling, submission of specimen other than nasopharyngeal swab, presence of viral mutation(s) within the areas targeted by this assay, and inadequate number of viral copies(<138 copies/mL). A  negative result must be combined with clinical observations, patient history, and epidemiological information. The expected result is Negative.  Fact Sheet for Patients:  EntrepreneurPulse.com.au  Fact Sheet for Healthcare Providers:  IncredibleEmployment.be  This test is no t yet approved or cleared by the [place] FDA and  has been authorized for detection and/or diagnosis of SARS-CoV-2 by FDA under an Emergency Use Authorization (EUA). This EUA will remain  in effect (meaning this test can be used) for the duration of the COVID-19 declaration under Section 564(b)(1) of the Act, 21 U.S.C.section 360bbb-3(b)(1), unless the authorization is terminated  or revoked sooner.       Influenza A by PCR NEGATIVE NEGATIVE Final   Influenza B by PCR NEGATIVE NEGATIVE Final    Comment: (NOTE) The Xpert Xpress SARS-CoV-2/FLU/RSV plus assay is intended as an  aid in the diagnosis of influenza from Nasopharyngeal swab specimens and should not be used as a sole basis for treatment. Nasal washings and aspirates are unacceptable for Xpert Xpress SARS-CoV-2/FLU/RSV testing.  Fact Sheet for Patients: EntrepreneurPulse.com.au  Fact Sheet for Healthcare Providers: IncredibleEmployment.be  This test is not yet approved or cleared by the [place] FDA and has been authorized for detection and/or diagnosis of SARS-CoV-2 by FDA under an Emergency Use Authorization (EUA). This EUA will remain in effect (meaning this test can be used) for the duration of the COVID-19 declaration under Section 564(b)(1) of the Act, 21 U.S.C. section 360bbb-3(b)(1), unless the authorization is terminated or revoked.  Performed at [facility], [place] [address], [place], [place] [address]   Culture, blood (Routine x 2)     Status: None (Preliminary result)   Collection Time: 03/30/21  3:20 PM   Specimen: BLOOD  Result Value Ref Range Status   Specimen Description   Final    BLOOD PORTA CATH Performed at [place] [address] [place], [place] [address]    Special Requests   Final    BOTTLES DRAWN AEROBIC AND ANAEROBIC Blood Culture adequate volume Performed at [place] [address], [place], [place] [address]    Culture   Final    NO GROWTH < 12 HOURS Performed at [place] [address] [place], [place] [address]    Report Status PENDING  Incomplete  Culture, blood (Routine x 2)     Status: None (Preliminary result)   Collection Time: 03/30/21  3:27 PM   Specimen: BLOOD  Result Value Ref Range Status   Specimen Description   Final    BLOOD LEFT ANTECUBITAL Performed at [place] [address] [place], [place] [address]    Special Requests   Final    BOTTLES DRAWN AEROBIC AND ANAEROBIC Blood Culture results may  not be optimal due to an excessive volume of blood received in culture bottles Performed at [place] [address], [place], [place] [address]    Culture   Final    NO GROWTH < 12 HOURS Performed at [place] [address] [place], [place] [address]    Report Status PENDING  Incomplete         Radiology Studies: DG Abd 1 View  Result Date: 03/31/2021 CLINICAL DATA:  Feeling ill EXAM: ABDOMEN - 1 VIEW COMPARISON:  None. FINDINGS: Nonobstructive pattern of bowel gas with gas present to the rectum. No large burden of stool. No free air in the abdomen on supine radiographs. No radio-opaque calculi  or other significant radiographic abnormality are seen. IMPRESSION: Nonobstructive pattern of bowel gas with gas present to the rectum. No large burden of stool. No free air in the abdomen on supine radiographs. Electronically Signed   By: Delanna Ahmadi M.D.   On: 03/31/2021 09:12   CT Angio Chest PE W and/or Wo Contrast  Result Date: 03/30/2021 CLINICAL DATA:  Shortness of breath EXAM: CT ANGIOGRAPHY CHEST WITH CONTRAST TECHNIQUE: Multidetector CT imaging of the chest was performed using the standard protocol during bolus administration of intravenous contrast. Multiplanar CT image reconstructions and MIPs were obtained to evaluate the vascular anatomy. CONTRAST:  20mL OMNIPAQUE IOHEXOL 350 MG/ML SOLN COMPARISON:  Chest CT dated Oct 03, 2020 FINDINGS: Cardiovascular: Adequate contrast opacification of the pulmonary arteries normal heart size. No pericardial effusion. No evidence of central pulmonary embolus. Limited evaluation of the segmental and subsegmental pulmonary arteries due to streak artifact related to arm position. Minimal atherosclerotic disease of the thoracic aorta. Mediastinum/Nodes: Mildly enlarged mediastinal lymph nodes. Reference subcarinal lymph node measuring 1.3 cm in short axis on series 4, image 57. Lungs/Pleura: Diffuse bilateral ground-glass  opacities with more focal ground-glass opacities of the upper lobes and areas of interlobular septal thickening. No pleural effusion or pneumothorax. Upper Abdomen: No acute abnormality. Skin thickening of the right breast. Musculoskeletal: No chest wall abnormality. No acute or significant osseous findings. Review of the MIP images confirms the above findings. IMPRESSION: 1. No evidence of central pulmonary embolus. Limited evaluation of the segmental and subsegmental pulmonary arteries due to streak artifact related to arm position. 2. Diffuse bilateral ground-glass opacities, differential includes pulmonary edema or infectious process including viral pneumonia. 3. Mildly enlarged mediastinal lymph nodes, likely reactive. Electronically Signed   By: Yetta Glassman M.D.   On: 03/30/2021 18:03        Scheduled Meds:  carvedilol  12.5 mg Oral BID WC   Chlorhexidine Gluconate Cloth  6 each Topical Daily   enoxaparin (LOVENOX) injection  40 mg Subcutaneous Q24H   gabapentin  300 mg Oral BID   lactose free nutrition  237 mL Oral TID WC   multivitamin with minerals  1 tablet Oral Daily   sacubitril-valsartan  1 tablet Oral BID   sodium chloride flush  3 mL Intravenous Q12H   Continuous Infusions:  azithromycin 500 mg (03/31/21 1753)   cefTRIAXone (ROCEPHIN)  IV Stopped (04/01/21 0940)     LOS: 1 day    Time spent: 41 minutes spent on chart review, discussion with nursing staff, consultants, updating family and interview/physical exam; more than 50% of that time was spent in counseling and/or coordination of care.    Hazen Brumett J [place] DO [facility] Available via Epic secure chat 7am-7pm After these hours, please refer to coverage provider listed on [email or web address] 04/01/2021, 3:02 PM

## 2021-04-01 NOTE — Evaluation (Signed)
Occupational Therapy Evaluation Patient Details Name: Marie Church MRN: [id number] DOB: 03-15-1956 Today's Date: 04/01/2021   History of Present Illness Marie Church is a 65 year old female with past medical history significant for malignant right breast cancer with axillary node involvement on active Kadcyla and radiation, chronic systolic congestive heart failure (EF 50%), type 2 diabetes mellitus, essential hypertension, anemia/thrombocytopenia who presented to [place] ED on 11/13 for shortness of breath.CT angiogram chest with no evidence of central pulmonary embolism,diffuse bilateral groundglass opacities consistent with pulmonary edema versus infectious process.seen in ED on 11/11 following her radiation treatment for evaluation of cough, fever and weakness, treated for pneumonia and sent home   Clinical Impression   Patient is currently requiring assistance with ADLs including min guard to min assist with toileting, standing LE dressing, and with bathing, and min guard assist with transfers and ambulation.  Current level of function is below patient's typical baseline.  During this evaluation, patient was limited by generalized weakness, impaired activity tolerance, and RT knee pain, all of which has the potential to impact patient's safety and independence during functional mobility, as well as performance for ADLs.  Patient lives with her son, who is able to provide PRN supervision and assistance.  Patient demonstrates good rehab potential, and should benefit from continued skilled occupational therapy services while in acute care to maximize safety, independence and quality of life at home.  Continued occupational therapy services in the home is recommended.  ?     Recommendations for follow up therapy are one component of a multi-disciplinary discharge planning process, led by the attending physician.  Recommendations may be updated based on patient status, additional functional  criteria and insurance authorization.   Follow Up Recommendations  Skilled nursing-short term rehab (<3 hours/day)    Assistance Recommended at Discharge Frequent or constant Supervision/Assistance  Functional Status Assessment  Patient has had a recent decline in their functional status and demonstrates the ability to make significant improvements in function in a reasonable and predictable amount of time.  Equipment Recommendations       Recommendations for Other Services       Precautions / Restrictions Precautions Precautions: Fall Precaution Comments: monitor O2 and HR, has LE cellulitis, very weak legs Restrictions Weight Bearing Restrictions: No      Mobility Bed Mobility               General bed mobility comments: in recliner    Transfers Overall transfer level: Needs assistance Equipment used: Rolling walker (2 wheels);None Transfers: Sit to/from Stand Sit to Stand: Min assist;Min guard           General transfer comment: from recliner with no Rw, min assist for safety, Minguard with RW      Balance Overall balance assessment: Mild deficits observed, not formally tested                                         ADL either performed or assessed with clinical judgement   ADL Overall ADL's : Needs assistance/impaired Eating/Feeding: Modified independent;Sitting   Grooming: Set up;Sitting   Upper Body Bathing: Set up;Sitting   Lower Body Bathing: Minimal assistance   Upper Body Dressing : Set up;Sitting   Lower Body Dressing: Minimal assistance   Toilet Transfer: Min guard;Rolling walker (2 wheels)   Toileting- Clothing Manipulation and Hygiene: Minimal assistance;Sit to/from stand  Functional mobility during ADLs: Supervision/safety;Min guard;Minimal assistance       Vision Patient Visual Report: No change from baseline Vision Assessment?: No apparent visual deficits     Perception Perception Perception:  Within Functional Limits   Praxis Praxis Praxis: Intact    Pertinent Vitals/Pain Pain Assessment: 0-10 Pain Score: 8  Pain Location: knees Pain Descriptors / Indicators: Discomfort Pain Intervention(s): Monitored during session     Hand Dominance Right   Extremity/Trunk Assessment Upper Extremity Assessment Upper Extremity Assessment: Generalized weakness;RUE deficits/detail;LUE deficits/detail RUE Deficits / Details: AROM at shoulder limited to ~70 degrees. PROM to ~160 with stretch felt. RUE Coordination: decreased fine motor (due to hand weakness) LUE Deficits / Details: AROM at shoulder limited to ~90-100 degrees. PROM WFL LUE Coordination: decreased fine motor (due to hand weakness)   Lower Extremity Assessment Lower Extremity Assessment: Generalized weakness   Cervical / Trunk Assessment Cervical / Trunk Assessment: Normal   Communication Communication Communication: No difficulties   Cognition Arousal/Alertness: Awake/alert Behavior During Therapy: WFL for tasks assessed/performed Overall Cognitive Status: Within Functional Limits for tasks assessed                                 General Comments: Ox4     General Comments       Exercises     Shoulder Instructions      Home Living Family/patient expects to be discharged to:: Private residence Living Arrangements: Children Available Help at Discharge: Available PRN/intermittently Type of Home: House Home Access: Level entry     Home Layout: One level     Bathroom Shower/Tub: Teacher, early years/pre: Handicapped height     Home Equipment: Grab bars - tub/shower;Hand held shower head;Grab bars - toilet          Prior Functioning/Environment Prior Level of Function : Independent/Modified Independent               ADLs Comments: Pt does not drive and kids provide.        OT Problem List: Decreased strength;Pain;Cardiopulmonary status limiting  activity;Decreased range of motion;Decreased activity tolerance;Decreased safety awareness;Impaired balance (sitting and/or standing);Decreased knowledge of use of DME or AE;Impaired UE functional use      OT Treatment/Interventions: Self-care/ADL training;Therapeutic exercise;Therapeutic activities;Energy conservation;Patient/family education;DME and/or AE instruction;Balance training    OT Goals(Current goals can be found in the care plan section) Acute Rehab OT Goals Patient Stated Goal: "Not to depend on a walker." OT Goal Formulation: With patient/family Time For Goal Achievement: 04/15/21 Potential to Achieve Goals: Good ADL Goals Pt Will Perform Lower Body Bathing: with supervision;with adaptive equipment;sit to/from stand Pt Will Perform Lower Body Dressing: with modified independence;sit to/from stand;sitting/lateral leans Pt Will Transfer to Toilet: with modified independence;ambulating;regular height toilet Pt Will Perform Toileting - Clothing Manipulation and hygiene: with modified independence;sit to/from stand;sitting/lateral leans Pt Will Perform Tub/Shower Transfer: with supervision;ambulating;with caregiver independent in assisting Additional ADL Goal #1: Patient will identify at least 3 energy conservation strategies to employ at home in order to maximize function and quality of life and decrease caregiver burden while preventing exacerbation of symptoms and rehospitalization.  OT Frequency: Min 2X/week   Barriers to D/C:            Co-evaluation PT/OT/SLP Co-Evaluation/Treatment: Yes Reason for Co-Treatment: To address functional/ADL transfers;For patient/therapist safety PT goals addressed during session: Mobility/safety with mobility OT goals addressed during session: ADL's and self-care  AM-PAC OT "6 Clicks" Daily Activity     Outcome Measure Help from another person eating meals?: A Little Help from another person taking care of personal grooming?: A  Little Help from another person toileting, which includes using toliet, bedpan, or urinal?: A Little Help from another person bathing (including washing, rinsing, drying)?: A Little Help from another person to put on and taking off regular upper body clothing?: A Little Help from another person to put on and taking off regular lower body clothing?: A Little 6 Click Score: 18   End of Session Equipment Utilized During Treatment: Gait belt;Rolling walker (2 wheels) Nurse Communication: Mobility status  Activity Tolerance: Patient tolerated treatment well Patient left: in chair;with call bell/phone within reach;with chair alarm set  OT Visit Diagnosis: Unsteadiness on feet (R26.81);Pain;Muscle weakness (generalized) (M62.81) Pain - Right/Left: Right Pain - part of body: Knee                Time: 6712-4580 OT Time Calculation (min): 35 min Charges:  OT General Charges $OT Visit: 1 Visit OT Evaluation $OT Eval Moderate Complexity: 1 Mod  Aalaiyah Yassin, OT Acute Rehab Services Office: [phone] 04/01/2021 Julien Girt 04/01/2021, 2:25 PM

## 2021-04-01 NOTE — Evaluation (Signed)
Physical Therapy Evaluation Patient Details Name: Marie Church MRN: [id number] DOB: 03-26-1956 Today's Date: 04/01/2021  History of Present Illness  Marie Church is a 65 year old female with past medical history significant for malignant right breast cancer with axillary node involvement on active Kadcyla and radiation, chronic systolic congestive heart failure (EF 50%), type 2 diabetes mellitus, essential hypertension, anemia/thrombocytopenia who presented to [place] ED on 11/13 for shortness of breath.CT angiogram chest with no evidence of central pulmonary embolism,diffuse bilateral groundglass opacities consistent with pulmonary edema versus infectious process.seen in ED on 11/11 following her radiation treatment for evaluation of cough, fever and weakness, treated for pneumonia and sent home  Clinical Impression   The patient ambulated x 150' using RW. Patient noted to be  less steady without RW and antalgic on LLE.  Patient will benefit from a RW  and HHPT. Pt admitted with above diagnosis.  Pt currently with functional limitations due to the deficits listed below (see PT Problem List). Pt will benefit from skilled PT to increase their independence and safety with mobility to allow discharge to the venue listed below.    Patient ambulated on 2 L [place], SPO@ 100% HR 90-115.        Recommendations for follow up therapy are one component of a multi-disciplinary discharge planning process, led by the attending physician.  Recommendations may be updated based on patient status, additional functional criteria and insurance authorization.  Follow Up Recommendations Home health PT    Assistance Recommended at Discharge Intermittent Supervision/Assistance  Functional Status Assessment Patient has had a recent decline in their functional status and demonstrates the ability to make significant improvements in function in a reasonable and predictable amount of time.  Equipment Recommendations   Rolling walker (2 wheels)    Recommendations for Other Services       Precautions / Restrictions Precautions Precautions: Fall Precaution Comments: monitor ats and HR, has LE cellulitis, very weak legs      Mobility  Bed Mobility               General bed mobility comments: in recliner    Transfers Overall transfer level: Needs assistance Equipment used: Rolling walker (2 wheels);None Transfers: Sit to/from Stand Sit to Stand: Min assist;Min guard           General transfer comment: from recliner with no Rw, min assist for safety, Minguard with RW    Ambulation/Gait Ambulation/Gait assistance: Min assist;Min guard Gait Distance (Feet): 150 Feet Assistive device: Rolling walker (2 wheels) Gait Pattern/deviations: Step-through pattern;Antalgic Gait velocity: decr     General Gait Details: noted antalgic right leg, more so without Rw, improved with RW  Stairs            Wheelchair Mobility    Modified Rankin (Stroke Patients Only)       Balance Overall balance assessment: Mild deficits observed, not formally tested                                           Pertinent Vitals/Pain Pain Assessment: Faces Pain Score: 8  Pain Location: knees Pain Descriptors / Indicators: Discomfort Pain Intervention(s): Monitored during session    Home Living Family/patient expects to be discharged to:: Private residence Living Arrangements: Children Available Help at Discharge: Available PRN/intermittently Type of Home: House Home Access: Level entry       Home Layout: One level Home  Equipment: Grab bars - tub/shower;Hand held shower head;Grab bars - toilet      Prior Function Prior Level of Function : Independent/Modified Independent (unemployed)               ADLs Comments: Pt does not drive and kids provide.     Hand Dominance   Dominant Hand: Right    Extremity/Trunk Assessment        Lower Extremity  Assessment Lower Extremity Assessment: Generalized weakness    Cervical / Trunk Assessment Cervical / Trunk Assessment: Normal  Communication   Communication: No difficulties  Cognition Arousal/Alertness: Awake/alert Behavior During Therapy: WFL for tasks assessed/performed Overall Cognitive Status: Within Functional Limits for tasks assessed                                 General Comments: Ox4        General Comments      Exercises     Assessment/Plan    PT Assessment Patient needs continued PT services  PT Problem List Decreased strength;Decreased mobility;Decreased knowledge of precautions;Decreased activity tolerance;Pain;Decreased knowledge of use of DME;Cardiopulmonary status limiting activity       PT Treatment Interventions DME instruction;Therapeutic activities;Gait training;Therapeutic exercise;Patient/family education;Functional mobility training    PT Goals (Current goals can be found in the Care Plan section)  Acute Rehab PT Goals Patient Stated Goal: go home PT Goal Formulation: With patient/family Time For Goal Achievement: 04/15/21 Potential to Achieve Goals: Good    Frequency Min 3X/week   Barriers to discharge        Co-evaluation PT/OT/SLP Co-Evaluation/Treatment: Yes Reason for Co-Treatment: To address functional/ADL transfers;For patient/therapist safety PT goals addressed during session: Mobility/safety with mobility OT goals addressed during session: ADL's and self-care       AM-PAC PT "6 Clicks" Mobility  Outcome Measure Help needed turning from your back to your side while in a flat bed without using bedrails?: A Little Help needed moving from lying on your back to sitting on the side of a flat bed without using bedrails?: A Little Help needed moving to and from a bed to a chair (including a wheelchair)?: A Little Help needed standing up from a chair using your arms (e.g., wheelchair or bedside chair)?: A Little Help  needed to walk in hospital room?: A Little Help needed climbing 3-5 steps with a railing? : A Lot 6 Click Score: 17    End of Session Equipment Utilized During Treatment: Gait belt;Oxygen Activity Tolerance: Patient tolerated treatment well Patient left: in chair;with call bell/phone within reach;with family/visitor present Nurse Communication: Mobility status PT Visit Diagnosis: Unsteadiness on feet (R26.81);Pain;Difficulty in walking, not elsewhere classified (R26.2) Pain - Right/Left: Right Pain - part of body: Knee    Time: 6433-2951 PT Time Calculation (min) (ACUTE ONLY): 26 min   Charges:   PT Evaluation $PT Eval Low Complexity: [facility] Acute Rehabilitation Services Pager [phone] Office [phone]   Claretha Cooper 04/01/2021, 11:22 AM

## 2021-04-01 NOTE — Progress Notes (Signed)
Initial Nutrition Assessment  DOCUMENTATION CODES:  Not applicable  INTERVENTION:  Add Boost Plus po TID, each supplement provides 360 kcal and 14 grams of protein.  Add MVI with minerals daily.  Encourage PO and supplement intake.  NUTRITION DIAGNOSIS:  Increased nutrient needs related to cancer and cancer related treatments as evidenced by estimated needs.  GOAL:  Patient will meet greater than or equal to 90% of their needs  MONITOR:  PO intake, Supplement acceptance, Weight trends, Labs, I & O's  REASON FOR ASSESSMENT:  Malnutrition Screening Tool    ASSESSMENT:  65 yo female with a PMH of malignant right breast cancer with axillary node involvement on active Kadcyla and radiation, chronic systolic CHF (EF 24%), Q6ST, essential HTN, anemia/thrombocytopenia who is admitted with sepsis due to PNA.  RD working remotely. Attempted to call patient's room phone. Son answered while pt was resting.  Son reports that since starting chemotherapy, pt has only been eating about 50% of what she used to eat. He does report that pt drinks Boost at home.  He reports that she lost some weight during her last PNA diagnosis, but her weight appears stable. He had no specific numbers when asked.  Per Epic, pt has lost ~10 lbs (5.7%) in the last 2 months, which is not necessarily significant for the time frame, but it is concerning still.  RD to order Boost Plus TID and MVI with minerals daily for increased needs.  Medications: reviewed  Labs: reviewed; Na 133 (L), CBG 87-150 HbA1c: 6.4% (10/04/2020)  NUTRITION - FOCUSED PHYSICAL EXAM: Unable to perform - defer to follow-up  Diet Order:   Diet Order             Diet Heart Room service appropriate? Yes; Fluid consistency: Thin  Diet effective now                  EDUCATION NEEDS:  Education needs have been addressed  Skin:  Skin Assessment: Reviewed RN Assessment  Last BM:  04/01/21  Height:  Ht Readings from Last 1  Encounters:  03/30/21 5' 6.14" (1.68 m)   Weight:  Wt Readings from Last 1 Encounters:  04/01/21 76.8 kg   BMI:  Body mass index is 27.21 kg/m.  Estimated Nutritional Needs:  Kcal:  2100-2300 Protein:  95-110 grams Fluid:  >2.1 L  Derrel Nip, RD, LDN (she/her/hers) Clinical Inpatient Dietitian RD Pager/After-Hours/Weekend Pager # in [place]

## 2021-04-02 ENCOUNTER — Inpatient Hospital Stay (HOSPITAL_COMMUNITY): Payer: Self-pay

## 2021-04-02 ENCOUNTER — Ambulatory Visit
Admission: RE | Admit: 2021-04-02 | Discharge: 2021-04-02 | Disposition: A | Discharge status: Home / Self Care | Payer: Self-pay | Source: Ambulatory Visit | Attending: Radiation Oncology | Admitting: Radiation Oncology

## 2021-04-02 ENCOUNTER — Ambulatory Visit: Payer: Self-pay

## 2021-04-02 DIAGNOSIS — E871 Hypo-osmolality and hyponatremia: Secondary | ICD-10-CM

## 2021-04-02 DIAGNOSIS — R509 Fever, unspecified: Secondary | ICD-10-CM

## 2021-04-02 LAB — CULTURE, BLOOD (ROUTINE X 2)
Culture: NO GROWTH
Culture: NO GROWTH

## 2021-04-02 LAB — CBC
HCT: 29.5 % — ABNORMAL LOW (ref 36.0–46.0)
Hemoglobin: 9 g/dL — ABNORMAL LOW (ref 12.0–15.0)
MCH: 26.1 pg (ref 26.0–34.0)
MCHC: 30.5 g/dL (ref 30.0–36.0)
MCV: 85.5 fL (ref 80.0–100.0)
Platelets: 118 10*3/uL — ABNORMAL LOW (ref 150–400)
RBC: 3.45 MIL/uL — ABNORMAL LOW (ref 3.87–5.11)
RDW: 16.4 % — ABNORMAL HIGH (ref 11.5–15.5)
WBC: 7.2 10*3/uL (ref 4.0–10.5)
nRBC: 0 % (ref 0.0–0.2)

## 2021-04-02 LAB — URINALYSIS, ROUTINE W REFLEX MICROSCOPIC
Bilirubin Urine: NEGATIVE
Glucose, UA: NEGATIVE mg/dL
Hgb urine dipstick: NEGATIVE
Ketones, ur: NEGATIVE mg/dL
Leukocytes,Ua: NEGATIVE
Nitrite: NEGATIVE
Protein, ur: NEGATIVE mg/dL
Specific Gravity, Urine: 1.01 (ref 1.005–1.030)
pH: 6 (ref 5.0–8.0)

## 2021-04-02 LAB — BASIC METABOLIC PANEL
Anion gap: 5 (ref 5–15)
BUN: 8 mg/dL (ref 8–23)
CO2: 28 mmol/L (ref 22–32)
Calcium: 8.5 mg/dL — ABNORMAL LOW (ref 8.9–10.3)
Chloride: 99 mmol/L (ref 98–111)
Creatinine, Ser: 0.55 mg/dL (ref 0.44–1.00)
GFR, Estimated: >60 mL/min (ref >60)
Glucose, Bld: 102 mg/dL — ABNORMAL HIGH (ref 70–99)
Potassium: 4.3 mmol/L (ref 3.5–5.1)
Sodium: 132 mmol/L — ABNORMAL LOW (ref 135–145)

## 2021-04-02 LAB — MAGNESIUM: Magnesium: 2.1 mg/dL (ref 1.7–2.4)

## 2021-04-02 IMAGING — DX DG CHEST 2V
2 series · 2 of 2 positions shown · non-contrast
Comparison: [DATE] and older exams.  CT also dated [DATE].

CLINICAL DATA: 65-year-old lady with prior history of malignant
right breast cancer with axillary node involvement on active Kadcyla
and radiation treatment, chronic systolic heart failure with
improvement of left ventricular ejection fraction from 25% to 50%,
type 2 diabetes mellitus, hypertension, anemia, thrombocytopenia
presents to ED on [DATE] for shortness of breath. CT angiogram
of the chest shows diffuse bilateral groundglass opacities
consistent with pulmonary edema versus infectious process. Patient
was probably started on broad-spectrum IV antibiotics and referred
to [facility] for admission for acute respiratory failure with hypoxia
secondary to pneumonia in the setting of failed outpatient
antibiotic treatment.

EXAM:
CHEST - 2 VIEW

[chest lat]
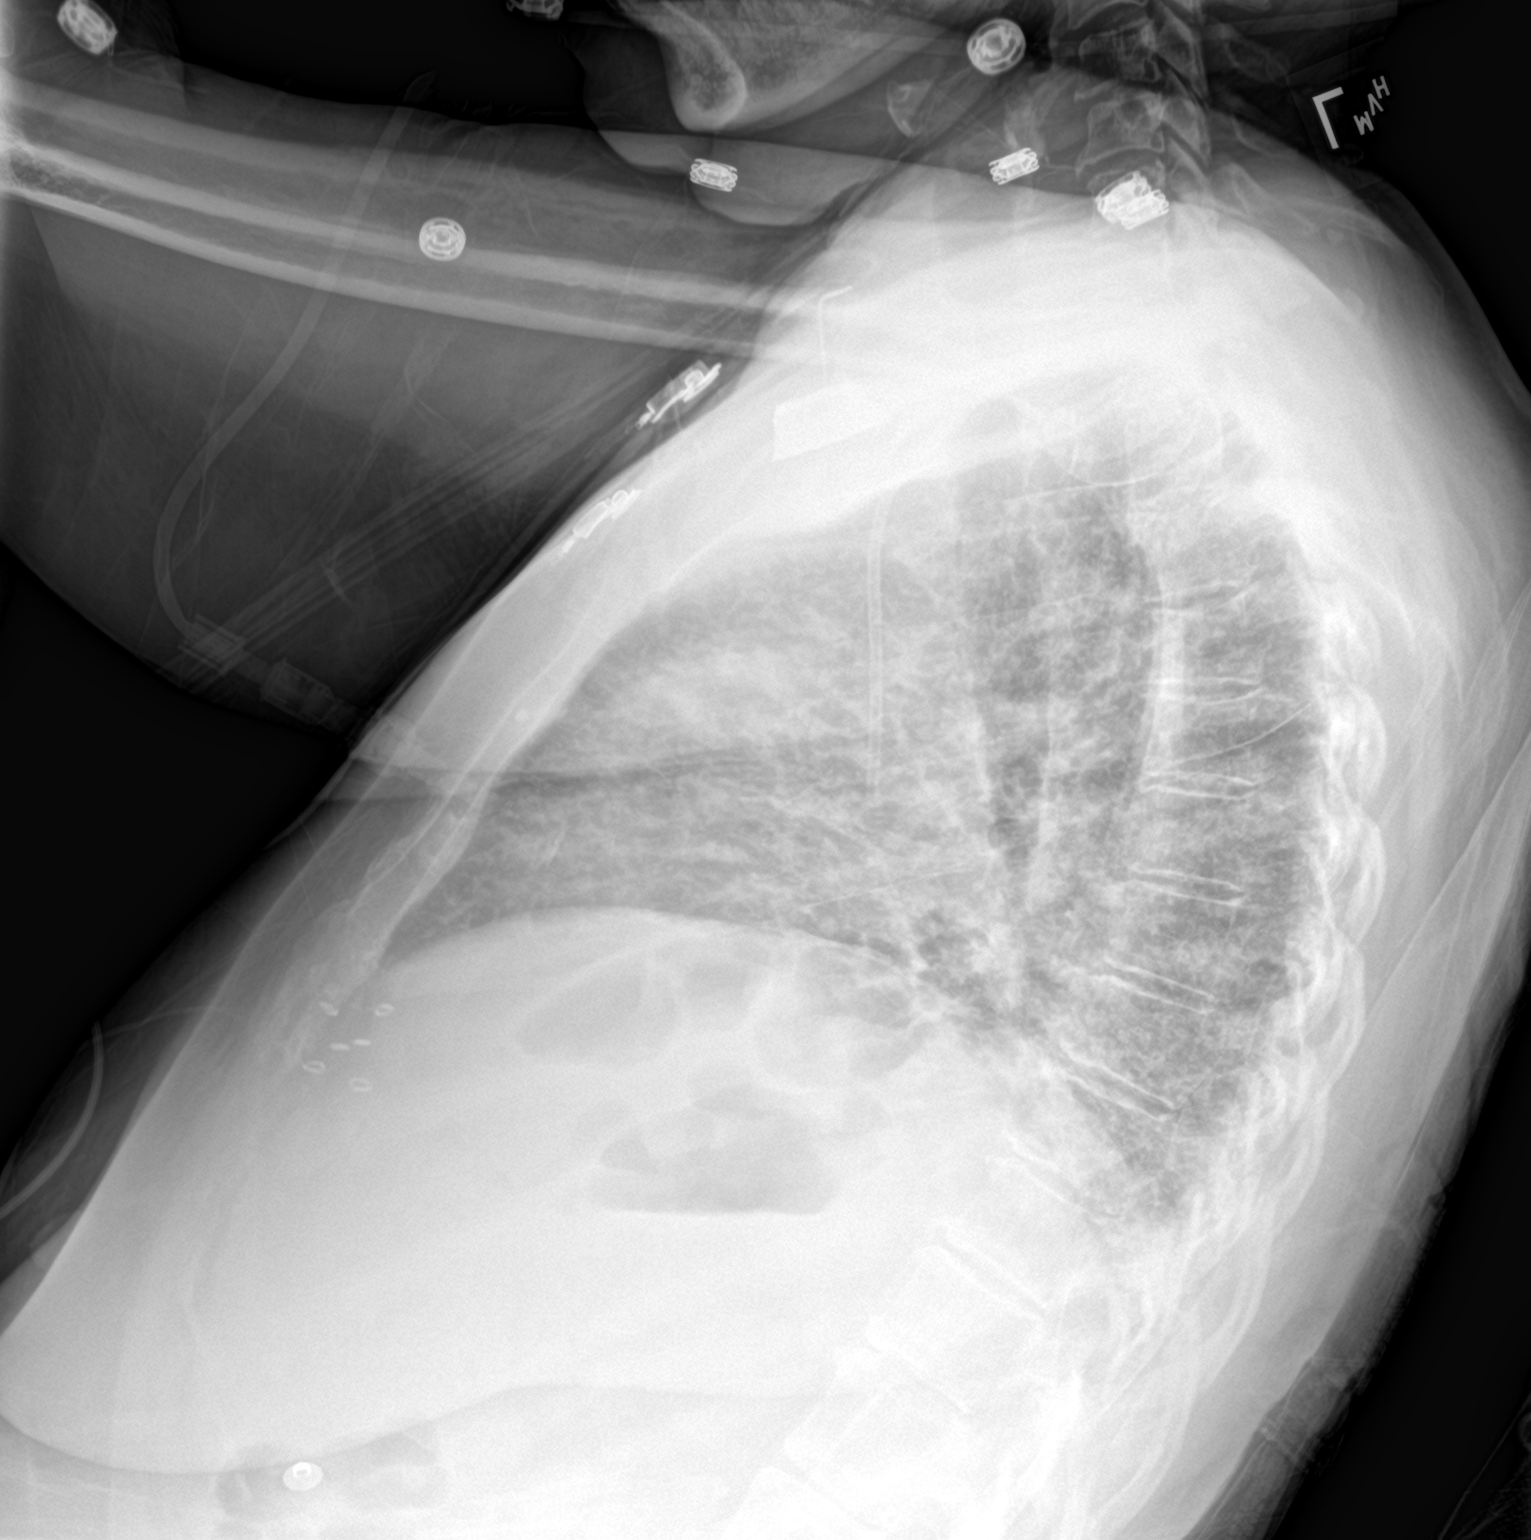

[chest ap]
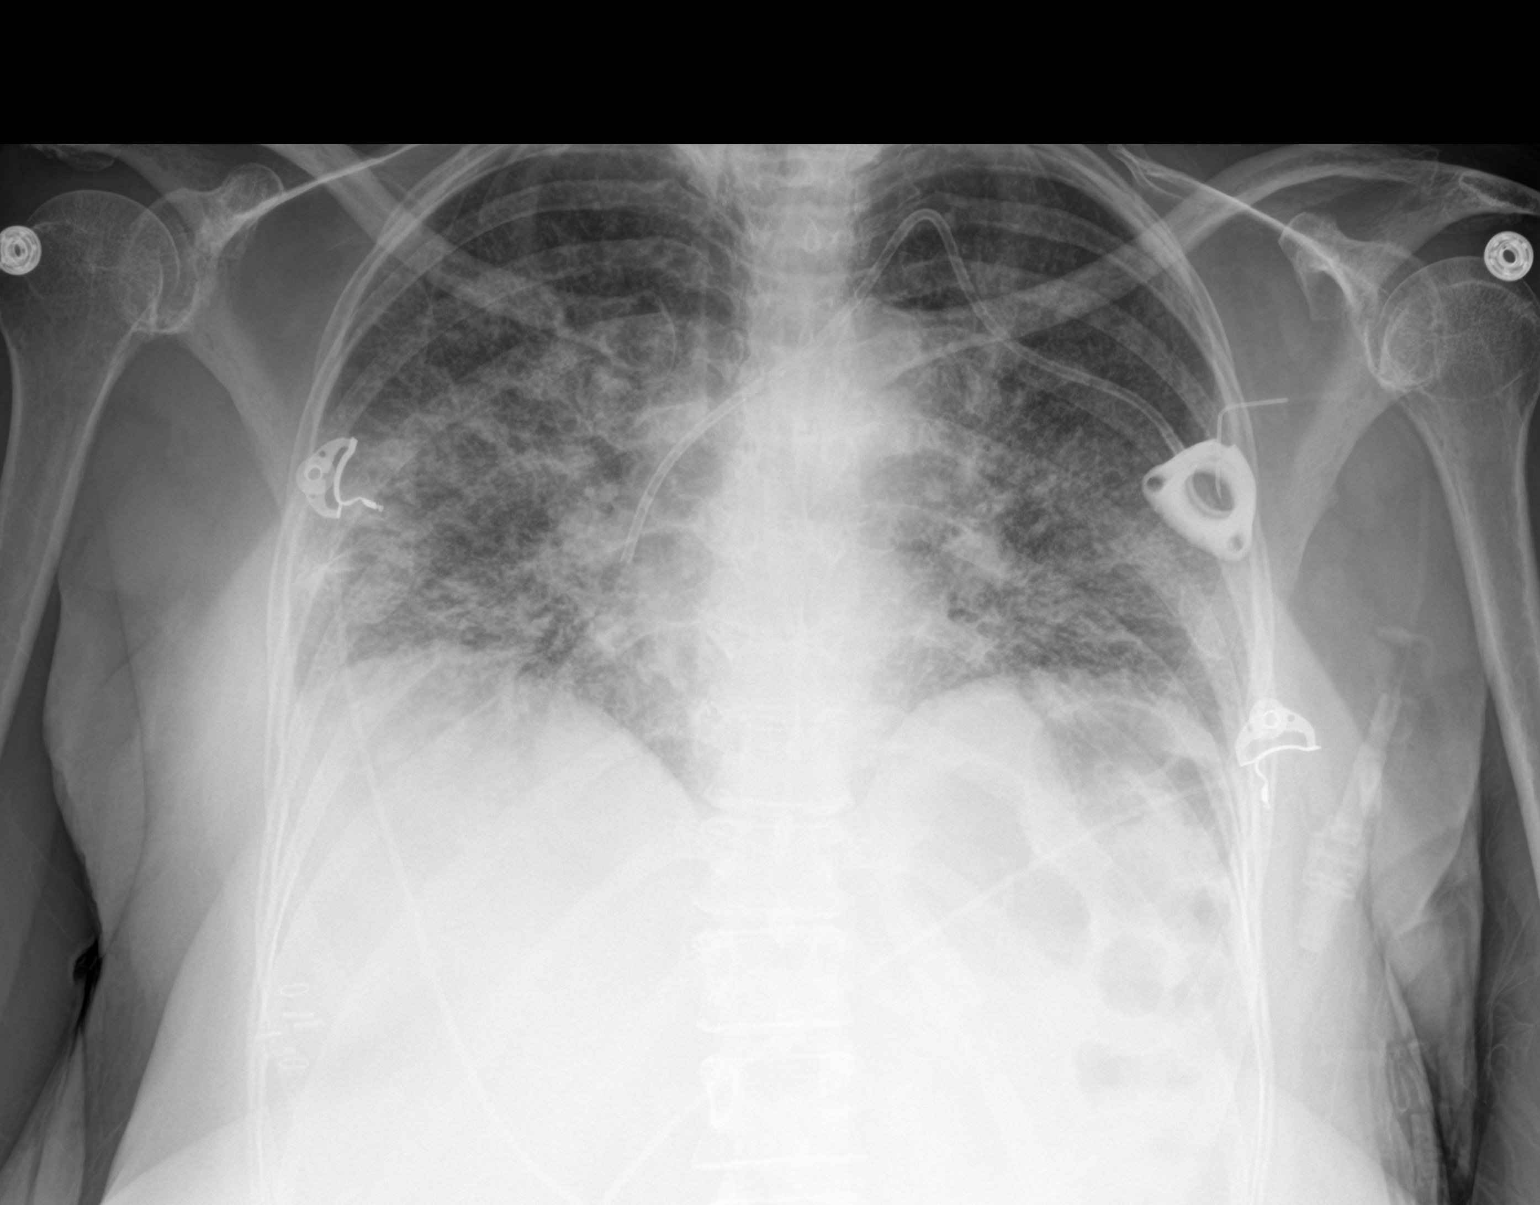

[2 of 2 positions shown; findings below may reference images not displayed]

FINDINGS: Heart normal in size.  No convincing mediastinal or hilar masses.

Lungs show bilateral interstitial and patchy airspace opacities,
which appear increased from the prior exam, most evident in the
right mid and lower lung.

No pleural effusion and no pneumothorax.

Left anterior chest wall Port-A-Cath is stable.

Minor wedge-shaped compression deformity of T4 stable from the prior
chest CT. No acute skeletal abnormality.
IMPRESSION: 1. Interval worsening of lung aeration. Interstitial and patchy
airspace opacities have increased most evident in the right mid to
lower lung.
2. No other change.  No pleural effusion or pneumothorax.

## 2021-04-02 NOTE — Progress Notes (Signed)
   04/02/21 1200  Mobility  Activity Refused mobility   Pt states feeling weak and general malaise today and is refusing mobility. Will check back later this week.  [place] Specialist Acute Rehab Services Office: [phone]

## 2021-04-02 NOTE — TOC Initial Note (Addendum)
Transition of Care [facility]) - Initial/Assessment Note    Patient Details  Name: Marie Church MRN: [id number] Date of Birth: 1955/06/07  Transition of Care [facility]) CM/SW Contact:    Dessa Phi, RN Phone Number: 04/02/2021, 3:26 PM  Clinical Narrative:  Damaris Schooner to patient/dtr Ronny Bacon about d/c plans-patient states she has insurance but there is no insurance listed    -informed financial counselor-case is [facility] to [facility]-they will contact patient about financial resources. Patient states she will stay with son Ramonta @ d/c address:[address] [place] [phone]. Has pcp,pharmacy,own transport home. Noted on 02-will monitor.PT recc HHPT-will check on charity [facility] agency.  [facility] following if home 02 needed-charity.   3:40p-[facility] charity will check if able to accept rep Stacie-await response.         Expected Discharge Plan: [place] Barriers to Discharge: Continued Medical Work up   Patient Goals and CMS Choice Patient states their goals for this hospitalization and ongoing recovery are:: go home CMS Medicare.gov Compare Post Acute Care list provided to:: Patient Choice offered to / list presented to : Adult Children Public relations account executive dtr [phone])  Expected Discharge Plan and Services Expected Discharge Plan: [place] Choice: [place] arrangements for the past 2 months: Single Family Home                                      Prior Living Arrangements/Services Living arrangements for the past 2 months: Single Family Home Lives with:: Adult Children Patient language and need for interpreter reviewed:: Yes Do you feel safe going back to the place where you live?: Yes      Need for Family Participation in Patient Care: No (Comment) Care giver support system in place?: Yes (comment)   Criminal Activity/Legal Involvement Pertinent to Current Situation/Hospitalization: No - Comment as  needed  Activities of Daily Living Home Assistive Devices/Equipment: None ADL Screening (condition at time of admission) Patient's cognitive ability adequate to safely complete daily activities?: Yes Is the patient deaf or have difficulty hearing?: No Does the patient have difficulty seeing, even when wearing glasses/contacts?: No Does the patient have difficulty concentrating, remembering, or making decisions?: No Patient able to express need for assistance with ADLs?: No Does the patient have difficulty dressing or bathing?: No Independently performs ADLs?: Yes (appropriate for developmental age) Does the patient have difficulty walking or climbing stairs?: Yes Weakness of Legs: Both Weakness of Arms/Hands: Both  Permission Sought/Granted Permission sought to share information with : Case Manager Permission granted to share information with : Yes, Verbal Permission Granted  Share Information with NAME: Case Manager     Permission granted to share info w Relationship: Natasha dtr [phone]     Emotional Assessment Appearance:: Appears stated age Attitude/Demeanor/Rapport: Gracious Affect (typically observed): Accepting Orientation: : Oriented to Self, Oriented to Place, Oriented to  Time, Oriented to Situation Alcohol / Substance Use: Not Applicable Psych Involvement: No (comment)  Admission diagnosis:  Sepsis due to pneumonia ([place]) [J18.9, A41.9] Community acquired pneumonia, unspecified laterality [J18.9] Acute respiratory failure ([place]) [J96.00] Patient Active Problem List   Diagnosis Date Noted   Acute respiratory failure ([place]) 03/31/2021   Sepsis due to pneumonia ([place]) 03/30/2021   HFrEF (heart failure with reduced ejection fraction) ([place]) 03/30/2021   Hyponatremia 03/30/2021   Thrombocytopenia ([place]) 03/30/2021  Acute on chronic respiratory failure with hypoxia ([place]) 10/08/2020   Acute on chronic systolic CHF (congestive heart failure) ([place]) 10/08/2020   Hypoxia  10/03/2020   Hypertensive urgency 10/03/2020   Shortness of breath 10/03/2020   Elevated troponin 10/03/2020   Severe protein-calorie malnutrition ([place]) 08/16/2020   Ulcer of right groin, limited to breakdown of skin ([place]) 08/08/2020   Anemia 07/31/2020   GERD without esophagitis 07/31/2020   Type 2 diabetes mellitus without complication, without long-term current use of insulin ([place]) 07/31/2020   Bilateral pneumonia 07/19/2020   Breast cancer ([place]) 04/03/2020   Malignant neoplasm of upper-outer quadrant of right breast in female, estrogen receptor negative ([place]) 03/18/2020   Sepsis ([place]) 03/14/2020   Axillary adenopathy 03/12/2020   Macrocytic anemia 02/29/2020   Physical debility 01/30/2020   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 [place] [address] Phone: [phone] Fax: [phone]     Social Determinants of Health (SDOH) Interventions    Readmission Risk Interventions Readmission Risk Prevention Plan 10/10/2020 10/07/2020  Transportation Screening Complete Complete  PCP or Specialist Appt within 5-7 Days Complete Complete  Home Care Screening Complete Complete  Medication Review (RN CM) Complete Complete

## 2021-04-02 NOTE — Progress Notes (Signed)
MD notified for patients temperature of 102.2 F.

## 2021-04-02 NOTE — Progress Notes (Signed)
PROGRESS NOTE    Marie Church  MWN:[id number] DOB: Oct 27, 1955 DOA: 03/30/2021 PCP: System, Provider Not In  Chief Complaint  Patient presents with   Pneumonia    Brief Narrative:  65 year old lady with prior history of malignant right breast cancer with axillary node involvement on active Kadcyla and radiation treatment, chronic systolic heart failure with improvement of left ventricular ejection fraction from 25% to 50%, type 2 diabetes mellitus, hypertension, anemia, thrombocytopenia presents to ED on 03/30/2021 for shortness of breath.  CT angiogram of the chest shows diffuse bilateral groundglass opacities consistent with pulmonary edema versus infectious process.  Patient was probably started on broad-spectrum IV antibiotics and referred to [facility] for admission for acute respiratory failure with hypoxia secondary to pneumonia in the setting of failed outpatient antibiotic treatment. Assessment & Plan:   Principal Problem:   Sepsis due to pneumonia [facility]) Active Problems:   Anemia   Malignant neoplasm of upper-outer quadrant of right breast in female, estrogen receptor negative (HCC)   HFrEF (heart failure with reduced ejection fraction) (HCC)   Hyponatremia   Thrombocytopenia (HCC)   Acute respiratory failure (HCC)   Acute respiratory failure with hypoxia, sepsis secondary to bilateral pneumonia present on admission. -in the setting of failed outpatient treatment. -Patient continues to require nasal cannula oxygen to keep sats greater than 90%. -CT angiogram of the chest showed diffuse ground glass opacities -COVID-19 PCR, influenza PCR is negative, urine for strep pneumonia is negative, Legionella antigen is pending. -Patient febrile overnight repeat cultures ordered and repeat chest x-ray ordered. -Recommend to continue IV antibiotics, and fever does not resolve will request infectious disease consult.    Hyponatremia Suspect hypovolemic hyponatremia in the setting of  dehydration. Continue to monitor.   Chronic systolic heart failure/history of nonischemic cardiomyopathy. Echocardiogram on 03/03/2021 showed improvement in left ventricular ejection fraction to 50% compared to the previous echo showing left ventricular ejection fraction at 20 to 25%. Continue with Coreg at 12.5 mg twice daily, Entresto 24-26 mg twice daily. Holding Lasix and spironolactone for now.     Metastatic right-sided breast cancer with axillary node involvement Patient follows up with [facility] oncology, currently on active chemotherapy with Kadcyla with last treatment on 03/19/2021 Patient also undergoing radiation treatment through radiation oncology.    Abdominal pain Improved abdominal x-ray does not show any bowel obstruction.    Pulmonary nodules Recommend outpatient follow-up to exclude metastatic disease.   Anemia/thrombocytopenia Probably secondary to chemotherapy Continue to monitor counts.    DVT prophylaxis: (Lovenox) Code Status: (Full code) Family Communication: none at bedside.  Disposition:   Status is: Inpatient  Remains inpatient appropriate because: IV ANTIbiotics, persistent fevers       Consultants:  None  Procedures: None  Antimicrobials:  Antibiotics Given (last 72 hours)     Date/Time Action Medication Dose Rate   03/30/21 1654 New Bag/Given   ceFEPIme (MAXIPIME) 2 g in sodium chloride 0.9 % 100 mL IVPB 2 g 200 mL/hr   03/30/21 1804 New Bag/Given   azithromycin (ZITHROMAX) 500 mg in sodium chloride 0.9 % 250 mL IVPB 500 mg 250 mL/hr   03/31/21 0319 New Bag/Given   cefTRIAXone (ROCEPHIN) 2 g in sodium chloride 0.9 % 100 mL IVPB 2 g 200 mL/hr   03/31/21 1753 New Bag/Given   azithromycin (ZITHROMAX) 500 mg in sodium chloride 0.9 % 250 mL IVPB 500 mg 250 mL/hr   04/01/21 0104 New Bag/Given   cefTRIAXone (ROCEPHIN) 2 g in sodium chloride 0.9 % 100 mL IVPB  2 g 200 mL/hr   04/01/21 1827 New Bag/Given   azithromycin (ZITHROMAX)  500 mg in sodium chloride 0.9 % 250 mL IVPB 500 mg 250 mL/hr   04/02/21 0134 New Bag/Given   cefTRIAXone (ROCEPHIN) 2 g in sodium chloride 0.9 % 100 mL IVPB 2 g 200 mL/hr         Subjective: Patient reports she was short of breath last night  Objective: Vitals:   04/02/21 0043 04/02/21 0502 04/02/21 0607 04/02/21 1100  BP: 98/62 (!) 166/81  111/71  Pulse: 78 99  77  Resp: 20 20    Temp: 99.3 F (37.4 C) (!) 102.2 F (39 C) (!) 101.4 F (38.6 C) 99.3 F (37.4 C)  TempSrc: Oral Oral Oral Oral  SpO2: 92% 100%  100%  Weight:   76.8 kg   Height:        Intake/Output Summary (Last 24 hours) at 04/02/2021 1227 Last data filed at 04/02/2021 0256 Gross per 24 hour  Intake 943 ml  Output --  Net 943 ml   Filed Weights   03/30/21 2200 04/01/21 0500 04/02/21 9449  Weight: 76.4 kg 76.8 kg 76.8 kg    Examination:  General exam: Ill appearing elderly lady not in any kind of distress Respiratory system: Diminished air entry at bases, on 4 L of nasal cannula oxygen. Cardiovascular system: S1 & S2 heard, RRR. No JVD, No pedal edema. Gastrointestinal system: Abdomen is nondistended, soft and nontender.  Normal bowel sounds heard. Central nervous system: Alert and oriented. No focal neurological deficits. Extremities: Symmetric 5 x 5 power. Skin: No rashes, lesions or ulcers Psychiatry: Mood & affect appropriate.      Data Reviewed: I have personally reviewed following labs and imaging studies  CBC: Recent Labs  Lab 03/28/21 1732 03/30/21 1439 03/31/21 0349 04/02/21 0527  WBC 7.2 7.9 6.7 7.2  NEUTROABS 4.0 5.1  --   --   HGB 10.2* 10.0* 9.0* 9.0*  HCT 32.1* 31.4* 29.0* 29.5*  MCV 83.6 82.2 83.8 85.5  PLT 106* 121* 116* 118*    Basic Metabolic Panel: Recent Labs  Lab 03/28/21 1732 03/30/21 1439 03/31/21 0349 04/01/21 0307 04/02/21 0527  NA 130* 130* 135 133* 132*  K 3.3* 3.7 3.7 3.5 4.3  CL 96* 97* 99 99 99  CO2 26 27 28 29 28   GLUCOSE 108* 102* 103* 106*  102*  BUN 11 9 7* 7* 8  CREATININE 0.76 0.62 0.45 0.43* 0.55  CALCIUM 8.5* 8.3* 8.2* 8.0* 8.5*  MG  --   --   --  2.0 2.1    GFR: Estimated Creatinine Clearance: 75.6 mL/min (by C-G formula based on SCr of 0.55 mg/dL).  Liver Function Tests: Recent Labs  Lab 03/28/21 1732 03/30/21 1439  AST 70* 61*  ALT 23 21  ALKPHOS 53 50  BILITOT 0.6 0.6  PROT 9.3* 9.1*  ALBUMIN 3.0* 2.9*    CBG: Recent Labs  Lab 03/28/21 2101 03/30/21 2158 04/01/21 0734 04/01/21 1143  GLUCAP 150* 87 117* 127*     Recent Results (from the past 240 hour(s))  Blood culture (routine x 2)     Status: None   Collection Time: 03/28/21  5:32 PM   Specimen: BLOOD  Result Value Ref Range Status   Specimen Description   Final    BLOOD LEFT ANTECUBITAL Performed at [facility], [place] [address], [place], [place] [address]    Special Requests   Final    BOTTLES DRAWN AEROBIC  AND ANAEROBIC Blood Culture results may not be optimal due to an inadequate volume of blood received in culture bottles Performed at [facility], [place] [address], [place], [place] [address]    Culture   Final    NO GROWTH 5 DAYS Performed at [facility], [place] [address] [place], [place] [address]    Report Status 04/02/2021 FINAL  Final  Resp Panel by RT-PCR (Flu A&B, Covid) Nasopharyngeal Swab     Status: None   Collection Time: 03/28/21  5:32 PM   Specimen: Nasopharyngeal Swab; Nasopharyngeal(NP) swabs in vial transport medium  Result Value Ref Range Status   SARS Coronavirus 2 by RT PCR NEGATIVE NEGATIVE Final    Comment: (NOTE) SARS-CoV-2 target nucleic acids are NOT DETECTED.  The SARS-CoV-2 RNA is generally detectable in upper respiratory specimens during the acute phase of infection. The lowest concentration of SARS-CoV-2 viral copies this assay can detect is 138 copies/mL. A negative result does not preclude SARS-Cov-2 infection and should not be used as the sole basis for  treatment or other patient management decisions. A negative result may occur with  improper specimen collection/handling, submission of specimen other than nasopharyngeal swab, presence of viral mutation(s) within the areas targeted by this assay, and inadequate number of viral copies(<138 copies/mL). A negative result must be combined with clinical observations, patient history, and epidemiological information. The expected result is Negative.  Fact Sheet for Patients:  EntrepreneurPulse.com.au  Fact Sheet for Healthcare Providers:  IncredibleEmployment.be  This test is no t yet approved or cleared by the [place] FDA and  has been authorized for detection and/or diagnosis of SARS-CoV-2 by FDA under an Emergency Use Authorization (EUA). This EUA will remain  in effect (meaning this test can be used) for the duration of the COVID-19 declaration under Section 564(b)(1) of the Act, 21 U.S.C.section 360bbb-3(b)(1), unless the authorization is terminated  or revoked sooner.       Influenza A by PCR NEGATIVE NEGATIVE Final   Influenza B by PCR NEGATIVE NEGATIVE Final    Comment: (NOTE) The Xpert Xpress SARS-CoV-2/FLU/RSV plus assay is intended as an aid in the diagnosis of influenza from Nasopharyngeal swab specimens and should not be used as a sole basis for treatment. Nasal washings and aspirates are unacceptable for Xpert Xpress SARS-CoV-2/FLU/RSV testing.  Fact Sheet for Patients: EntrepreneurPulse.com.au  Fact Sheet for Healthcare Providers: IncredibleEmployment.be  This test is not yet approved or cleared by the [place] FDA and has been authorized for detection and/or diagnosis of SARS-CoV-2 by FDA under an Emergency Use Authorization (EUA). This EUA will remain in effect (meaning this test can be used) for the duration of the COVID-19 declaration under Section 564(b)(1) of the Act, 21  U.S.C. section 360bbb-3(b)(1), unless the authorization is terminated or revoked.  Performed at [facility], [place] [address], [place], [place] [address]   Blood culture (routine x 2)     Status: None   Collection Time: 03/28/21  5:37 PM   Specimen: BLOOD  Result Value Ref Range Status   Specimen Description   Final    BLOOD BLOOD LEFT WRIST Performed at [place] [address], [place], [place] [address]    Special Requests   Final    BOTTLES DRAWN AEROBIC AND ANAEROBIC Blood Culture results may not be optimal due to an inadequate volume of blood received in culture bottles Performed at [place] [address], [place], [place] [address]  Culture   Final    NO GROWTH 5 DAYS Performed at [facility], [place] [address] [place], [place] [address]    Report Status 04/02/2021 FINAL  Final  Resp Panel by RT-PCR (Flu A&B, Covid) Nasopharyngeal Swab     Status: None   Collection Time: 03/30/21  2:39 PM   Specimen: Nasopharyngeal Swab; Nasopharyngeal(NP) swabs in vial transport medium  Result Value Ref Range Status   SARS Coronavirus 2 by RT PCR NEGATIVE NEGATIVE Final    Comment: (NOTE) SARS-CoV-2 target nucleic acids are NOT DETECTED.  The SARS-CoV-2 RNA is generally detectable in upper respiratory specimens during the acute phase of infection. The lowest concentration of SARS-CoV-2 viral copies this assay can detect is 138 copies/mL. A negative result does not preclude SARS-Cov-2 infection and should not be used as the sole basis for treatment or other patient management decisions. A negative result may occur with  improper specimen collection/handling, submission of specimen other than nasopharyngeal swab, presence of viral mutation(s) within the areas targeted by this assay, and inadequate number of viral copies(<138 copies/mL). A negative result must be combined with clinical observations, patient  history, and epidemiological information. The expected result is Negative.  Fact Sheet for Patients:  EntrepreneurPulse.com.au  Fact Sheet for Healthcare Providers:  IncredibleEmployment.be  This test is no t yet approved or cleared by the [place] FDA and  has been authorized for detection and/or diagnosis of SARS-CoV-2 by FDA under an Emergency Use Authorization (EUA). This EUA will remain  in effect (meaning this test can be used) for the duration of the COVID-19 declaration under Section 564(b)(1) of the Act, 21 U.S.C.section 360bbb-3(b)(1), unless the authorization is terminated  or revoked sooner.       Influenza A by PCR NEGATIVE NEGATIVE Final   Influenza B by PCR NEGATIVE NEGATIVE Final    Comment: (NOTE) The Xpert Xpress SARS-CoV-2/FLU/RSV plus assay is intended as an aid in the diagnosis of influenza from Nasopharyngeal swab specimens and should not be used as a sole basis for treatment. Nasal washings and aspirates are unacceptable for Xpert Xpress SARS-CoV-2/FLU/RSV testing.  Fact Sheet for Patients: EntrepreneurPulse.com.au  Fact Sheet for Healthcare Providers: IncredibleEmployment.be  This test is not yet approved or cleared by the [place] FDA and has been authorized for detection and/or diagnosis of SARS-CoV-2 by FDA under an Emergency Use Authorization (EUA). This EUA will remain in effect (meaning this test can be used) for the duration of the COVID-19 declaration under Section 564(b)(1) of the Act, 21 U.S.C. section 360bbb-3(b)(1), unless the authorization is terminated or revoked.  Performed at [facility], [place] [address], [place], [place] [address]   Culture, blood (Routine x 2)     Status: None (Preliminary result)   Collection Time: 03/30/21  3:20 PM   Specimen: BLOOD  Result Value Ref Range Status   Specimen Description   Final    BLOOD PORTA  CATH Performed at [place] [address] [place], [place] [address]    Special Requests   Final    BOTTLES DRAWN AEROBIC AND ANAEROBIC Blood Culture adequate volume Performed at [place] [address] [place], [place] [address]    Culture   Final    NO GROWTH 3 DAYS Performed at [facility], [place] [address], [place], [place] [address]    Report Status PENDING  Incomplete  Culture, blood (Routine x 2)     Status: None (Preliminary result)  Collection Time: 03/30/21  3:27 PM   Specimen: BLOOD  Result Value Ref Range Status   Specimen Description   Final    BLOOD LEFT ANTECUBITAL Performed at [place] [address], [place], [place] [address]    Special Requests   Final    BOTTLES DRAWN AEROBIC AND ANAEROBIC Blood Culture results may not be optimal due to an excessive volume of blood received in culture bottles Performed at [place] [address], [place], [place] [address]    Culture   Final    NO GROWTH 3 DAYS Performed at [facility], [place] [address], [place], [place] [address]    Report Status PENDING  Incomplete         Radiology Studies: No results found.      Scheduled Meds:  carvedilol  12.5 mg Oral BID WC   Chlorhexidine Gluconate Cloth  6 each Topical Daily   enoxaparin (LOVENOX) injection  40 mg Subcutaneous Q24H   gabapentin  300 mg Oral BID   lactose free nutrition  237 mL Oral TID WC   multivitamin with minerals  1 tablet Oral Daily   sacubitril-valsartan  1 tablet Oral BID   sodium chloride flush  3 mL Intravenous Q12H   Continuous Infusions:  azithromycin 500 mg (04/01/21 1827)   cefTRIAXone (ROCEPHIN)  IV Stopped (04/02/21 0204)     LOS: 2 days        Hosie Poisson, MD [facility]   To contact the attending provider between 7A-7P or the covering provider during after hours 7P-7A, please log into the web site  [email or web address] and access using universal Protection password for that web site. If you do not have the password, please call the hospital operator.  04/02/2021, 12:27 PM

## 2021-04-02 NOTE — Plan of Care (Signed)

## 2021-04-03 ENCOUNTER — Ambulatory Visit: Payer: Self-pay

## 2021-04-03 ENCOUNTER — Encounter (HOSPITAL_COMMUNITY): Payer: Self-pay | Admitting: Internal Medicine

## 2021-04-03 ENCOUNTER — Other Ambulatory Visit: Payer: Self-pay

## 2021-04-03 LAB — CBC
HCT: 28.7 % — ABNORMAL LOW (ref 36.0–46.0)
Hemoglobin: 8.8 g/dL — ABNORMAL LOW (ref 12.0–15.0)
MCH: 26.5 pg (ref 26.0–34.0)
MCHC: 30.7 g/dL (ref 30.0–36.0)
MCV: 86.4 fL (ref 80.0–100.0)
Platelets: 118 10*3/uL — ABNORMAL LOW (ref 150–400)
RBC: 3.32 MIL/uL — ABNORMAL LOW (ref 3.87–5.11)
RDW: 16.3 % — ABNORMAL HIGH (ref 11.5–15.5)
WBC: 6.1 10*3/uL (ref 4.0–10.5)
nRBC: 0 % (ref 0.0–0.2)

## 2021-04-03 LAB — MRSA NEXT GEN BY PCR, NASAL: MRSA by PCR Next Gen: NOT DETECTED

## 2021-04-03 MED ORDER — DRONABINOL 5 MG PO CAPS
5.0000 mg | ORAL_CAPSULE | Freq: Two times a day (BID) | ORAL | Status: DC
  Administered 2021-04-03 – 2021-04-06 (×8): 5 mg via ORAL
  Filled 2021-04-03 (×9): qty 1

## 2021-04-03 MED ORDER — VANCOMYCIN HCL 1750 MG/350ML IV SOLN
1750.0000 mg | INTRAVENOUS | Status: DC
Start: 2021-04-03 — End: 2021-04-04
  Administered 2021-04-03: 21:00:00 1750 mg via INTRAVENOUS
  Filled 2021-04-03: qty 350

## 2021-04-03 MED ORDER — LORAZEPAM 0.5 MG PO TABS
0.5000 mg | ORAL_TABLET | Freq: Three times a day (TID) | ORAL | Status: DC | PRN
  Administered 2021-04-03: 17:00:00 0.5 mg via ORAL
  Filled 2021-04-03: qty 1

## 2021-04-03 MED ORDER — SODIUM CHLORIDE 0.9 % IV SOLN
2.0000 g | Freq: Three times a day (TID) | INTRAVENOUS | Status: DC
  Administered 2021-04-03 – 2021-04-07 (×12): 2 g via INTRAVENOUS
  Filled 2021-04-03 (×14): qty 2

## 2021-04-03 MED ORDER — VANCOMYCIN HCL 1500 MG/300ML IV SOLN
1500.0000 mg | Freq: Once | INTRAVENOUS | Status: AC
  Administered 2021-04-03: 12:00:00 1500 mg via INTRAVENOUS
  Filled 2021-04-03: qty 300

## 2021-04-03 NOTE — Progress Notes (Signed)
PROGRESS NOTE    Marie Church  EZM:[id number] DOB: 07-28-1955 DOA: 03/30/2021 PCP: System, Provider Not In  Chief Complaint  Patient presents with   Pneumonia    Brief Narrative:  65 year old lady with prior history of malignant right breast cancer with axillary node involvement on active Kadcyla and radiation treatment, chronic systolic heart failure with improvement of left ventricular ejection fraction from 25% to 50%, type 2 diabetes mellitus, hypertension, anemia, thrombocytopenia presents to ED on 03/30/2021 for shortness of breath.  CT angiogram of the chest shows diffuse bilateral groundglass opacities consistent with pulmonary edema versus infectious process.  Patient was probably started on broad-spectrum IV antibiotics and referred to [facility] for admission for acute respiratory failure with hypoxia secondary to pneumonia in the setting of failed outpatient antibiotic treatment.  Assessment & Plan:   Principal Problem:   Sepsis due to pneumonia [facility]) Active Problems:   Anemia   Malignant neoplasm of upper-outer quadrant of right breast in female, estrogen receptor negative (HCC)   HFrEF (heart failure with reduced ejection fraction) (HCC)   Hyponatremia   Thrombocytopenia (HCC)   Acute respiratory failure (HCC)   Acute respiratory failure with hypoxia, sepsis secondary to bilateral pneumonia present on admission. Differentials include radiation pneumonitis vs metastatic disease.  -in the setting of failed outpatient treatment. -Patient continues to require nasal cannula oxygen to keep sats greater than 90%. She is currently on 2 lit of [place] oxygen.  -CT angiogram of the chest showed diffuse ground glass opacities -COVID-19 PCR, influenza PCR is negative, urine for strep pneumonia is negative,. -Patient febrile on 11/15 night, repeat cultures ordered and repeat chest x-ray ordered.Re peat CXR shows worsening aeration of the lung . Interstitial and patchy airspace opacities  have increased most evident in the right mid to lower lung. Changed the antibiotics to vancomycin and cefepime.    Hyponatremia Suspect hypovolemic hyponatremia in the setting of dehydration. Continue to monitor.   Chronic systolic heart failure/history of nonischemic cardiomyopathy. Echocardiogram on 03/03/2021 showed improvement in left ventricular ejection fraction to 50% compared to the previous echo showing left ventricular ejection fraction at 20 to 25%. Continue with Coreg at 12.5 mg twice daily, Entresto 24-26 mg twice daily. Holding Lasix and spironolactone for now. Recheck BMP in am.      Metastatic right-sided breast cancer with axillary node involvement Patient follows up with [facility] oncology, currently on active chemotherapy with Kadcyla with last treatment on 03/19/2021 Patient also undergoing radiation treatment through radiation oncology at [facility].     Abdominal pain Improved.  abdominal x-ray does not show any bowel obstruction.    Pulmonary nodules Recommend outpatient follow-up to exclude metastatic disease.   Anemia/thrombocytopenia Probably secondary to chemotherapy Continue to monitor counts.  Poor appetite and nausea:  Add marinol.     DVT prophylaxis: (Lovenox) Code Status: (Full code) Family Communication: discussed with daughter at bedside.  Disposition:   Status is: Inpatient  Remains inpatient appropriate because: IV antibiotics.        Consultants:  None  Procedures: None  Antimicrobials:  Antibiotics Given (last 72 hours)     Date/Time Action Medication Dose Rate   03/31/21 1753 New Bag/Given   azithromycin (ZITHROMAX) 500 mg in sodium chloride 0.9 % 250 mL IVPB 500 mg 250 mL/hr   04/01/21 0104 New Bag/Given   cefTRIAXone (ROCEPHIN) 2 g in sodium chloride 0.9 % 100 mL IVPB 2 g 200 mL/hr   04/01/21 1827 New Bag/Given   azithromycin (ZITHROMAX) 500 mg in sodium chloride  0.9 % 250 mL IVPB 500 mg 250 mL/hr   04/02/21 0134 New  Bag/Given   cefTRIAXone (ROCEPHIN) 2 g in sodium chloride 0.9 % 100 mL IVPB 2 g 200 mL/hr   04/02/21 1726 New Bag/Given   azithromycin (ZITHROMAX) 500 mg in sodium chloride 0.9 % 250 mL IVPB 500 mg 250 mL/hr   04/03/21 0211 New Bag/Given   cefTRIAXone (ROCEPHIN) 2 g in sodium chloride 0.9 % 100 mL IVPB 2 g 200 mL/hr   04/03/21 1113 New Bag/Given   ceFEPIme (MAXIPIME) 2 g in sodium chloride 0.9 % 100 mL IVPB 2 g 200 mL/hr   04/03/21 1216 New Bag/Given   vancomycin (VANCOREADY) IVPB 1500 mg/300 mL 1,500 mg 150 mL/hr         Subjective: Not feeling good still on 2 lit of [place] oxygen.    Objective: Vitals:   04/03/21 0500 04/03/21 0538 04/03/21 0550 04/03/21 0819  BP:   (!) 150/81   Pulse:  62 100   Resp:      Temp:   100.2 F (37.9 C) 99.2 F (37.3 C)  TempSrc:   Oral Oral  SpO2:  100% 96%   Weight: 79.1 kg     Height:       No intake or output data in the 24 hours ending 04/03/21 1518  Filed Weights   04/01/21 0500 04/02/21 0607 04/03/21 0500  Weight: 76.8 kg 76.8 kg 79.1 kg    Examination:  General exam: ill appearing lady, on [place] oxygen,  Respiratory system: diminished air entry at bases, no wheezing heard, on 2l it of [place] oxygen.  Cardiovascular system: S1 & S2 heard, RRR. No JVD, No pedal edema. Gastrointestinal system: Abdomen is nondistended, soft and nontender.  Normal bowel sounds heard. Central nervous system: Alert and oriented. No focal neurological deficits. Extremities: Symmetric 5 x 5 power. Skin: No rashes, lesions or ulcers Psychiatry: Mood & affect appropriate.      Data Reviewed: I have personally reviewed following labs and imaging studies  CBC: Recent Labs  Lab 03/28/21 1732 03/30/21 1439 03/31/21 0349 04/02/21 0527 04/03/21 0502  WBC 7.2 7.9 6.7 7.2 6.1  NEUTROABS 4.0 5.1  --   --   --   HGB 10.2* 10.0* 9.0* 9.0* 8.8*  HCT 32.1* 31.4* 29.0* 29.5* 28.7*  MCV 83.6 82.2 83.8 85.5 86.4  PLT 106* 121* 116* 118* 118*     Basic  Metabolic Panel: Recent Labs  Lab 03/28/21 1732 03/30/21 1439 03/31/21 0349 04/01/21 0307 04/02/21 0527  NA 130* 130* 135 133* 132*  K 3.3* 3.7 3.7 3.5 4.3  CL 96* 97* 99 99 99  CO2 26 27 28 29 28   GLUCOSE 108* 102* 103* 106* 102*  BUN 11 9 7* 7* 8  CREATININE 0.76 0.62 0.45 0.43* 0.55  CALCIUM 8.5* 8.3* 8.2* 8.0* 8.5*  MG  --   --   --  2.0 2.1     GFR: Estimated Creatinine Clearance: 76.6 mL/min (by C-G formula based on SCr of 0.55 mg/dL).  Liver Function Tests: Recent Labs  Lab 03/28/21 1732 03/30/21 1439  AST 70* 61*  ALT 23 21  ALKPHOS 53 50  BILITOT 0.6 0.6  PROT 9.3* 9.1*  ALBUMIN 3.0* 2.9*     CBG: Recent Labs  Lab 03/28/21 2101 03/30/21 2158 04/01/21 0734 04/01/21 1143  GLUCAP 150* 87 117* 127*      Recent Results (from the past 240 hour(s))  Blood culture (routine x 2)  Status: None   Collection Time: 03/28/21  5:32 PM   Specimen: BLOOD  Result Value Ref Range Status   Specimen Description   Final    BLOOD LEFT ANTECUBITAL Performed at [facility] [address], [place], [place] [address]    Special Requests   Final    BOTTLES DRAWN AEROBIC AND ANAEROBIC Blood Culture results may not be optimal due to an inadequate volume of blood received in culture bottles Performed at [facility] [address], [place], [place] [address]    Culture   Final    NO GROWTH 5 DAYS Performed at [facility], [place] [address], [place], [place] [address]    Report Status 04/02/2021 FINAL  Final  Resp Panel by RT-PCR (Flu A&B, Covid) Nasopharyngeal Swab     Status: None   Collection Time: 03/28/21  5:32 PM   Specimen: Nasopharyngeal Swab; Nasopharyngeal(NP) swabs in vial transport medium  Result Value Ref Range Status   SARS Coronavirus 2 by RT PCR NEGATIVE NEGATIVE Final    Comment: (NOTE) SARS-CoV-2 target nucleic acids are NOT DETECTED.  The SARS-CoV-2 RNA is generally detectable in upper  respiratory specimens during the acute phase of infection. The lowest concentration of SARS-CoV-2 viral copies this assay can detect is 138 copies/mL. A negative result does not preclude SARS-Cov-2 infection and should not be used as the sole basis for treatment or other patient management decisions. A negative result may occur with  improper specimen collection/handling, submission of specimen other than nasopharyngeal swab, presence of viral mutation(s) within the areas targeted by this assay, and inadequate number of viral copies(<138 copies/mL). A negative result must be combined with clinical observations, patient history, and epidemiological information. The expected result is Negative.  Fact Sheet for Patients:  EntrepreneurPulse.com.au  Fact Sheet for Healthcare Providers:  IncredibleEmployment.be  This test is no t yet approved or cleared by the [place] FDA and  has been authorized for detection and/or diagnosis of SARS-CoV-2 by FDA under an Emergency Use Authorization (EUA). This EUA will remain  in effect (meaning this test can be used) for the duration of the COVID-19 declaration under Section 564(b)(1) of the Act, 21 U.S.C.section 360bbb-3(b)(1), unless the authorization is terminated  or revoked sooner.       Influenza A by PCR NEGATIVE NEGATIVE Final   Influenza B by PCR NEGATIVE NEGATIVE Final    Comment: (NOTE) The Xpert Xpress SARS-CoV-2/FLU/RSV plus assay is intended as an aid in the diagnosis of influenza from Nasopharyngeal swab specimens and should not be used as a sole basis for treatment. Nasal washings and aspirates are unacceptable for Xpert Xpress SARS-CoV-2/FLU/RSV testing.  Fact Sheet for Patients: EntrepreneurPulse.com.au  Fact Sheet for Healthcare Providers: IncredibleEmployment.be  This test is not yet approved or cleared by the [place] FDA and has been  authorized for detection and/or diagnosis of SARS-CoV-2 by FDA under an Emergency Use Authorization (EUA). This EUA will remain in effect (meaning this test can be used) for the duration of the COVID-19 declaration under Section 564(b)(1) of the Act, 21 U.S.C. section 360bbb-3(b)(1), unless the authorization is terminated or revoked.  Performed at [facility], [place] [address] [place], [place] [address]   Blood culture (routine x 2)     Status: None   Collection Time: 03/28/21  5:37 PM   Specimen: BLOOD  Result Value Ref Range Status   Specimen Description   Final    BLOOD BLOOD LEFT WRIST  Performed at [facility], [place] [address] [place], [place] [address]    Special Requests   Final    BOTTLES DRAWN AEROBIC AND ANAEROBIC Blood Culture results may not be optimal due to an inadequate volume of blood received in culture bottles Performed at [facility] [address] [place], [place] [address]    Culture   Final    NO GROWTH 5 DAYS Performed at [facility], [place] [address], [place], [place] [address]    Report Status 04/02/2021 FINAL  Final  Resp Panel by RT-PCR (Flu A&B, Covid) Nasopharyngeal Swab     Status: None   Collection Time: 03/30/21  2:39 PM   Specimen: Nasopharyngeal Swab; Nasopharyngeal(NP) swabs in vial transport medium  Result Value Ref Range Status   SARS Coronavirus 2 by RT PCR NEGATIVE NEGATIVE Final    Comment: (NOTE) SARS-CoV-2 target nucleic acids are NOT DETECTED.  The SARS-CoV-2 RNA is generally detectable in upper respiratory specimens during the acute phase of infection. The lowest concentration of SARS-CoV-2 viral copies this assay can detect is 138 copies/mL. A negative result does not preclude SARS-Cov-2 infection and should not be used as the sole basis for treatment or other patient management decisions. A negative result may occur with  improper specimen collection/handling,  submission of specimen other than nasopharyngeal swab, presence of viral mutation(s) within the areas targeted by this assay, and inadequate number of viral copies(<138 copies/mL). A negative result must be combined with clinical observations, patient history, and epidemiological information. The expected result is Negative.  Fact Sheet for Patients:  EntrepreneurPulse.com.au  Fact Sheet for Healthcare Providers:  IncredibleEmployment.be  This test is no t yet approved or cleared by the [place] FDA and  has been authorized for detection and/or diagnosis of SARS-CoV-2 by FDA under an Emergency Use Authorization (EUA). This EUA will remain  in effect (meaning this test can be used) for the duration of the COVID-19 declaration under Section 564(b)(1) of the Act, 21 U.S.C.section 360bbb-3(b)(1), unless the authorization is terminated  or revoked sooner.       Influenza A by PCR NEGATIVE NEGATIVE Final   Influenza B by PCR NEGATIVE NEGATIVE Final    Comment: (NOTE) The Xpert Xpress SARS-CoV-2/FLU/RSV plus assay is intended as an aid in the diagnosis of influenza from Nasopharyngeal swab specimens and should not be used as a sole basis for treatment. Nasal washings and aspirates are unacceptable for Xpert Xpress SARS-CoV-2/FLU/RSV testing.  Fact Sheet for Patients: EntrepreneurPulse.com.au  Fact Sheet for Healthcare Providers: IncredibleEmployment.be  This test is not yet approved or cleared by the [place] FDA and has been authorized for detection and/or diagnosis of SARS-CoV-2 by FDA under an Emergency Use Authorization (EUA). This EUA will remain in effect (meaning this test can be used) for the duration of the COVID-19 declaration under Section 564(b)(1) of the Act, 21 U.S.C. section 360bbb-3(b)(1), unless the authorization is terminated or revoked.  Performed at [facility], [place] [address] [name], [place] [address]   Culture, blood (Routine x 2)     Status: None (Preliminary result)   Collection Time: 03/30/21  3:20 PM   Specimen: BLOOD  Result Value Ref Range Status   Specimen Description   Final    BLOOD PORTA CATH Performed at [facility] [address], [place], [place] [address]    Special Requests   Final    BOTTLES DRAWN AEROBIC AND ANAEROBIC Blood Culture adequate  volume Performed at [facility], [place] [address] [place], [place] [address]    Culture   Final    NO GROWTH 3 DAYS Performed at [facility], [place] [address] [place], [place] [address]    Report Status PENDING  Incomplete  Culture, blood (Routine x 2)     Status: None (Preliminary result)   Collection Time: 03/30/21  3:27 PM   Specimen: BLOOD  Result Value Ref Range Status   Specimen Description   Final    BLOOD LEFT ANTECUBITAL Performed at [facility] [address], [place], [place] [address]    Special Requests   Final    BOTTLES DRAWN AEROBIC AND ANAEROBIC Blood Culture results may not be optimal due to an excessive volume of blood received in culture bottles Performed at [facility] [address], [place], [place] [address]    Culture   Final    NO GROWTH 3 DAYS Performed at [facility], [place] [address], [place], [place] [address]    Report Status PENDING  Incomplete  MRSA Next Gen by PCR, Nasal     Status: None   Collection Time: 04/03/21 11:26 AM   Specimen: Nasal Mucosa; Nasal Swab  Result Value Ref Range Status   MRSA by PCR Next Gen NOT DETECTED NOT DETECTED Final    Comment: (NOTE) The GeneXpert MRSA Assay (FDA approved for NASAL specimens only), is one component of a comprehensive MRSA colonization surveillance program. It is not intended to diagnose MRSA infection nor to guide or monitor treatment for MRSA infections. Test performance is not FDA  approved in patients less than 1 years old. Performed at [facility], [place] [address] [place], [place] [address]           Radiology Studies: DG Chest 2 View  Result Date: 04/02/2021 CLINICAL DATA:  65 year old lady with prior history of malignant right breast cancer with axillary node involvement on active Kadcyla and radiation treatment, chronic systolic heart failure with improvement of left ventricular ejection fraction from 25% to 50%, type 2 diabetes mellitus, hypertension, anemia, thrombocytopenia presents to ED on 03/30/2021 for shortness of breath. CT angiogram of the chest shows diffuse bilateral groundglass opacities consistent with pulmonary edema versus infectious process. Patient was probably started on broad-spectrum IV antibiotics and referred to [facility] for admission for acute respiratory failure with hypoxia secondary to pneumonia in the setting of failed outpatient antibiotic treatment. EXAM: CHEST - 2 VIEW COMPARISON:  03/30/2021 and older exams.  CT also dated 03/30/2021. FINDINGS: Heart normal in size.  No convincing mediastinal or hilar masses. Lungs show bilateral interstitial and patchy airspace opacities, which appear increased from the prior exam, most evident in the right mid and lower lung. No pleural effusion and no pneumothorax. Left anterior chest wall Port-A-Cath is stable. Minor wedge-shaped compression deformity of T4 stable from the prior chest CT. No acute skeletal abnormality. IMPRESSION: 1. Interval worsening of lung aeration. Interstitial and patchy airspace opacities have increased most evident in the right mid to lower lung. 2. No other change.  No pleural effusion or pneumothorax. Electronically Signed   By: Lajean Manes M.D.   On: 04/02/2021 16:04        Scheduled Meds:  carvedilol  12.5 mg Oral BID WC   Chlorhexidine Gluconate Cloth  6 each Topical Daily   dronabinol  5 mg Oral BID AC   enoxaparin (LOVENOX) injection  40 mg  Subcutaneous Q24H  gabapentin  300 mg Oral BID   lactose free nutrition  237 mL Oral TID WC   multivitamin with minerals  1 tablet Oral Daily   sacubitril-valsartan  1 tablet Oral BID   sodium chloride flush  3 mL Intravenous Q12H   Continuous Infusions:  ceFEPime (MAXIPIME) IV 2 g (04/03/21 1113)   vancomycin       LOS: 3 days        Hosie Poisson, MD [facility]   To contact the attending provider between 7A-7P or the covering provider during after hours 7P-7A, please log into the web site [email or web address] and access using universal [place] password for that web site. If you do not have the password, please call the hospital operator.  04/03/2021, 3:18 PM

## 2021-04-03 NOTE — Progress Notes (Signed)
Report received from [facility]. No change in previous assessment, will continue plan of care. Kamirah Shugrue, Laurel Dimmer, RN

## 2021-04-03 NOTE — Progress Notes (Signed)
Pharmacy Antibiotic Note  Marie Church is a 65 y.o. female admitted on 03/30/2021 with pneumonia. Due to continued low-grade temps and worsening CXR overnight despite Rocephin/Zithromax, Pharmacy has been consulted for vancomycin and cefepime dosing.  Plan: Vancomycin 1500 mg IV now, then 1750 mg IV q24 hr (est AUC 507 based on SCr rounded to 0.8; Vd 0.72) Measure vancomycin AUC at steady state as indicated SCr q48 while on vanc MRSA PCR ordered; f/u and narrow vanc as appropriate   Height: 5' 6.14" (168 cm) Weight: 79.1 kg (174 lb 6.1 oz) IBW/kg (Calculated) : 59.63  Temp (24hrs), Avg:99.6 F (37.6 C), Min:99.2 F (37.3 C), Max:100.2 F (37.9 C)  Recent Labs  Lab 03/28/21 1732 03/30/21 1439 03/30/21 1805 03/31/21 0349 04/01/21 0307 04/02/21 0527 04/03/21 0502  WBC 7.2 7.9  --  6.7  --  7.2 6.1  CREATININE 0.76 0.62  --  0.45 0.43* 0.55  --   LATICACIDVEN 1.3 1.2 0.9  --   --   --   --     Estimated Creatinine Clearance: 76.6 mL/min (by C-G formula based on SCr of 0.55 mg/dL).    Allergies  Allergen Reactions   Doxycycline Rash   Amoxicillin-Pot Clavulanate Rash    Tolerates Rocephin   Lisinopril Rash    Antimicrobials this admission: 11/13 Rocephin/azithromycin >> 11/17 11/17 vancomycin >>  11/17 cefepime >>  Dose adjustments this admission: N/a  Microbiology results: 11/17 MRSA PCR: ordered 11/16 BCx: sent 11/13 BCx: ngtd 11/11 BCx: NGF Resp panel neg x 2   Thank you for allowing pharmacy to be a part of this patient's care.  Clarke Amburn A 04/03/2021 8:51 AM

## 2021-04-04 ENCOUNTER — Ambulatory Visit
Admission: RE | Admit: 2021-04-04 | Discharge: 2021-04-04 | Disposition: A | Discharge status: Home / Self Care | Payer: Self-pay | Source: Ambulatory Visit | Attending: Radiation Oncology | Admitting: Radiation Oncology

## 2021-04-04 ENCOUNTER — Ambulatory Visit: Payer: Self-pay

## 2021-04-04 ENCOUNTER — Encounter: Payer: Self-pay | Admitting: Radiation Oncology

## 2021-04-04 LAB — CULTURE, BLOOD (ROUTINE X 2)
Culture: NO GROWTH
Culture: NO GROWTH
Special Requests: ADEQUATE

## 2021-04-04 LAB — BASIC METABOLIC PANEL
Anion gap: 2 — ABNORMAL LOW (ref 5–15)
BUN: 7 mg/dL — ABNORMAL LOW (ref 8–23)
CO2: 31 mmol/L (ref 22–32)
Calcium: 8 mg/dL — ABNORMAL LOW (ref 8.9–10.3)
Chloride: 99 mmol/L (ref 98–111)
Creatinine, Ser: 0.52 mg/dL (ref 0.44–1.00)
GFR, Estimated: >60 mL/min (ref >60)
Glucose, Bld: 110 mg/dL — ABNORMAL HIGH (ref 70–99)
Potassium: 3.9 mmol/L (ref 3.5–5.1)
Sodium: 132 mmol/L — ABNORMAL LOW (ref 135–145)

## 2021-04-04 LAB — CBC
HCT: 29.5 % — ABNORMAL LOW (ref 36.0–46.0)
Hemoglobin: 8.9 g/dL — ABNORMAL LOW (ref 12.0–15.0)
MCH: 26.1 pg (ref 26.0–34.0)
MCHC: 30.2 g/dL (ref 30.0–36.0)
MCV: 86.5 fL (ref 80.0–100.0)
Platelets: 126 10*3/uL — ABNORMAL LOW (ref 150–400)
RBC: 3.41 MIL/uL — ABNORMAL LOW (ref 3.87–5.11)
RDW: 15.9 % — ABNORMAL HIGH (ref 11.5–15.5)
WBC: 6.1 10*3/uL (ref 4.0–10.5)
nRBC: 0 % (ref 0.0–0.2)

## 2021-04-04 MED ORDER — LORAZEPAM 0.5 MG PO TABS: 0.2500 mg | ORAL_TABLET | Freq: Two times a day (BID) | ORAL | Status: DC | PRN

## 2021-04-04 MED ORDER — MELATONIN 5 MG PO TABS
5.0000 mg | ORAL_TABLET | Freq: Once | ORAL | Status: AC
  Administered 2021-04-04: 5 mg via ORAL
  Filled 2021-04-04: qty 1

## 2021-04-04 MED ORDER — GABAPENTIN 100 MG PO CAPS
200.0000 mg | ORAL_CAPSULE | Freq: Two times a day (BID) | ORAL | Status: DC
  Administered 2021-04-04: 200 mg via ORAL
  Filled 2021-04-04 (×2): qty 2

## 2021-04-04 MED ORDER — ORAL CARE MOUTH RINSE
15.0000 mL | Freq: Two times a day (BID) | OROMUCOSAL | Status: DC
  Administered 2021-04-04 – 2021-04-15 (×18): 15 mL via OROMUCOSAL

## 2021-04-04 NOTE — Progress Notes (Signed)
The patient was seen today in the Department of radiation oncology.  The patient stated that she had concerns about possible progression, indicating that conversations with her inpatient team suggested possible infection versus progression of disease.  I told her that I would look at her imaging and weigh in from my perspective.  Based on her current imaging including CT scan of the chest, the patient's findings appear most consistent with pneumonia.  I think there is a very low chance that her findings are related to her malignancy after getting the CT scan of the chest with a fuller view of this area.  We will continue her course of adjuvant radiation treatment and she has 1 final week of treatment.  ------------------------------------------------  Jodelle Gross, MD, PhD

## 2021-04-04 NOTE — Plan of Care (Signed)

## 2021-04-04 NOTE — Progress Notes (Signed)
PROGRESS NOTE    Marie Church  KGM:[id number] DOB: December 22, 1955 DOA: 03/30/2021 PCP: System, Provider Not In  Chief Complaint  Patient presents with   Pneumonia    Brief Narrative:  65 year old lady with prior history of malignant right breast cancer with axillary node involvement on active Kadcyla and radiation treatment, chronic systolic heart failure with improvement of left ventricular ejection fraction from 25% to 50%, type 2 diabetes mellitus, hypertension, anemia, thrombocytopenia presents to ED on 03/30/2021 for shortness of breath.  CT angiogram of the chest shows diffuse bilateral groundglass opacities consistent with pulmonary edema versus infectious process.  Patient was probably started on broad-spectrum IV antibiotics and referred to [facility] for admission for acute respiratory failure with hypoxia secondary to pneumonia in the setting of failed outpatient antibiotic treatment.  Assessment & Plan:   Principal Problem:   Sepsis due to pneumonia [facility]) Active Problems:   Anemia   Malignant neoplasm of upper-outer quadrant of right breast in female, estrogen receptor negative (HCC)   HFrEF (heart failure with reduced ejection fraction) (HCC)   Hyponatremia   Thrombocytopenia (HCC)   Acute respiratory failure (HCC)   Acute respiratory failure with hypoxia, sepsis secondary to bilateral pneumonia present on admission. Differentials include radiation pneumonitis vs metastatic disease.  -in the setting of failed outpatient treatment. -Patient continues to require nasal cannula oxygen to keep sats greater than 90%. She is currently on 2 lit of [place] oxygen.  -CT angiogram of the chest showed diffuse ground glass opacities -COVID-19 PCR, influenza PCR is negative, urine for strep pneumonia is negative,. -Patient febrile on 11/15 night, repeat cultures ordered and repeat chest x-ray ordered.Re peat CXR shows worsening aeration of the lung . Interstitial and patchy airspace opacities  have increased most evident in the right mid to lower lung. Changed the antibiotics to vancomycin and cefepime. MRSA is negative. Continue with cefepime.  Therapy evaluations recommending home health PT.    Hyponatremia Suspect hypovolemic hyponatremia in the setting of dehydration. Continue to monitor.   Chronic systolic heart failure/history of nonischemic cardiomyopathy. Echocardiogram on 03/03/2021 showed improvement in left ventricular ejection fraction to 50% compared to the previous echo showing left ventricular ejection fraction at 20 to 25%. Continue with Coreg at 12.5 mg twice daily, Entresto 24-26 mg twice daily. Holding Lasix and spironolactone for now. Recheck BMP in am.      Metastatic right-sided breast cancer with axillary node involvement Patient follows up with [facility] oncology, currently on active chemotherapy with Kadcyla with last treatment on 03/19/2021 Patient also undergoing radiation treatment through radiation oncology at [facility].     Abdominal pain Improved.  abdominal x-ray does not show any bowel obstruction.    Pulmonary nodules Recommend outpatient follow-up to exclude metastatic disease.   Anemia/thrombocytopenia Probably secondary to chemotherapy Continue to monitor counts. Hemoglobin stable around 8 and platelet counts are improving.  Poor appetite and nausea:  Add marinol.  Nausea is better.     DVT prophylaxis: (Lovenox) Code Status: (Full code) Family Communication: discussed with daughter at bedside.  Disposition:   Status is: Inpatient  Remains inpatient appropriate because: IV antibiotics.        Consultants:  None  Procedures: None  Antimicrobials:  Antibiotics Given (last 72 hours)     Date/Time Action Medication Dose Rate   04/01/21 1827 New Bag/Given   azithromycin (ZITHROMAX) 500 mg in sodium chloride 0.9 % 250 mL IVPB 500 mg 250 mL/hr   04/02/21 0134 New Bag/Given   cefTRIAXone (ROCEPHIN) 2 g in  sodium  chloride 0.9 % 100 mL IVPB 2 g 200 mL/hr   04/02/21 1726 New Bag/Given   azithromycin (ZITHROMAX) 500 mg in sodium chloride 0.9 % 250 mL IVPB 500 mg 250 mL/hr   04/03/21 0211 New Bag/Given   cefTRIAXone (ROCEPHIN) 2 g in sodium chloride 0.9 % 100 mL IVPB 2 g 200 mL/hr   04/03/21 1113 New Bag/Given   ceFEPIme (MAXIPIME) 2 g in sodium chloride 0.9 % 100 mL IVPB 2 g 200 mL/hr   04/03/21 1216 New Bag/Given   vancomycin (VANCOREADY) IVPB 1500 mg/300 mL 1,500 mg 150 mL/hr   04/03/21 2000 New Bag/Given   ceFEPIme (MAXIPIME) 2 g in sodium chloride 0.9 % 100 mL IVPB 2 g 200 mL/hr   04/03/21 2127 New Bag/Given   vancomycin (VANCOREADY) IVPB 1750 mg/350 mL 1,750 mg 175 mL/hr   04/04/21 0311 New Bag/Given   ceFEPIme (MAXIPIME) 2 g in sodium chloride 0.9 % 100 mL IVPB 2 g 200 mL/hr   04/04/21 1237 New Bag/Given   ceFEPIme (MAXIPIME) 2 g in sodium chloride 0.9 % 100 mL IVPB 2 g 200 mL/hr         Subjective: She is more awake, not in distress.    Objective: Vitals:   04/03/21 1500 04/04/21 0500 04/04/21 0616 04/04/21 1243  BP: 130/80  (!) 158/95 108/63  Pulse: 100  (!) 111 84  Resp:   20 16  Temp: 98.2 F (36.8 C)  99.6 F (37.6 C) 99.1 F (37.3 C)  TempSrc: Oral  Oral Oral  SpO2: 98%  100% 95%  Weight:  76.7 kg    Height:        Intake/Output Summary (Last 24 hours) at 04/04/2021 1658 Last data filed at 04/04/2021 1245 Gross per 24 hour  Intake 933 ml  Output --  Net 933 ml    Filed Weights   04/02/21 0607 04/03/21 0500 04/04/21 0500  Weight: 76.8 kg 79.1 kg 76.7 kg    Examination:  General exam: ill appearing lady not in distress.  Respiratory system: scattered rhonchi, on 2 lit of [place] oxygen.  Cardiovascular system: S1 & S2 heard, RRR. No JVD,  No pedal edema. Gastrointestinal system: Abdomen is nondistended, soft and nontender.  Normal bowel sounds heard. Central nervous system: Alert and oriented. No focal neurological deficits. Extremities: Symmetric 5 x 5  power. Skin: No rashes, lesions or ulcers Psychiatry: Mood & affect appropriate.      Data Reviewed: I have personally reviewed following labs and imaging studies  CBC: Recent Labs  Lab 03/28/21 1732 03/30/21 1439 03/31/21 0349 04/02/21 0527 04/03/21 0502 04/04/21 0359  WBC 7.2 7.9 6.7 7.2 6.1 6.1  NEUTROABS 4.0 5.1  --   --   --   --   HGB 10.2* 10.0* 9.0* 9.0* 8.8* 8.9*  HCT 32.1* 31.4* 29.0* 29.5* 28.7* 29.5*  MCV 83.6 82.2 83.8 85.5 86.4 86.5  PLT 106* 121* 116* 118* 118* 126*     Basic Metabolic Panel: Recent Labs  Lab 03/30/21 1439 03/31/21 0349 04/01/21 0307 04/02/21 0527 04/04/21 0359  NA 130* 135 133* 132* 132*  K 3.7 3.7 3.5 4.3 3.9  CL 97* 99 99 99 99  CO2 27 28 29 28 31   GLUCOSE 102* 103* 106* 102* 110*  BUN 9 7* 7* 8 7*  CREATININE 0.62 0.45 0.43* 0.55 0.52  CALCIUM 8.3* 8.2* 8.0* 8.5* 8.0*  MG  --   --  2.0 2.1  --      GFR:  Estimated Creatinine Clearance: 75.4 mL/min (by C-G formula based on SCr of 0.52 mg/dL).  Liver Function Tests: Recent Labs  Lab 03/28/21 1732 03/30/21 1439  AST 70* 61*  ALT 23 21  ALKPHOS 53 50  BILITOT 0.6 0.6  PROT 9.3* 9.1*  ALBUMIN 3.0* 2.9*     CBG: Recent Labs  Lab 03/28/21 2101 03/30/21 2158 04/01/21 0734 04/01/21 1143  GLUCAP 150* 87 117* 127*      Recent Results (from the past 240 hour(s))  Blood culture (routine x 2)     Status: None   Collection Time: 03/28/21  5:32 PM   Specimen: BLOOD  Result Value Ref Range Status   Specimen Description   Final    BLOOD LEFT ANTECUBITAL Performed at [facility], [place] [address], [place], [place] [address]    Special Requests   Final    BOTTLES DRAWN AEROBIC AND ANAEROBIC Blood Culture results may not be optimal due to an inadequate volume of blood received in culture bottles Performed at [facility] [address] [place], [place] [address]    Culture   Final    NO GROWTH 5 DAYS Performed at [facility], [place] [address] [place], [place] [address]    Report Status 04/02/2021 FINAL  Final  Resp Panel by RT-PCR (Flu A&B, Covid) Nasopharyngeal Swab     Status: None   Collection Time: 03/28/21  5:32 PM   Specimen: Nasopharyngeal Swab; Nasopharyngeal(NP) swabs in vial transport medium  Result Value Ref Range Status   SARS Coronavirus 2 by RT PCR NEGATIVE NEGATIVE Final    Comment: (NOTE) SARS-CoV-2 target nucleic acids are NOT DETECTED.  The SARS-CoV-2 RNA is generally detectable in upper respiratory specimens during the acute phase of infection. The lowest concentration of SARS-CoV-2 viral copies this assay can detect is 138 copies/mL. A negative result does not preclude SARS-Cov-2 infection and should not be used as the sole basis for treatment or other patient management decisions. A negative result may occur with  improper specimen collection/handling, submission of specimen other than nasopharyngeal swab, presence of viral mutation(s) within the areas targeted by this assay, and inadequate number of viral copies(<138 copies/mL). A negative result must be combined with clinical observations, patient history, and epidemiological information. The expected result is Negative.  Fact Sheet for Patients:  EntrepreneurPulse.com.au  Fact Sheet for Healthcare Providers:  IncredibleEmployment.be  This test is no t yet approved or cleared by the [place] FDA and  has been authorized for detection and/or diagnosis of SARS-CoV-2 by FDA under an Emergency Use Authorization (EUA). This EUA will remain  in effect (meaning this test can be used) for the duration of the COVID-19 declaration under Section 564(b)(1) of the Act, 21 U.S.C.section 360bbb-3(b)(1), unless the authorization is terminated  or revoked sooner.       Influenza A by PCR NEGATIVE NEGATIVE Final   Influenza B by PCR NEGATIVE NEGATIVE Final    Comment: (NOTE) The Xpert Xpress  SARS-CoV-2/FLU/RSV plus assay is intended as an aid in the diagnosis of influenza from Nasopharyngeal swab specimens and should not be used as a sole basis for treatment. Nasal washings and aspirates are unacceptable for Xpert Xpress SARS-CoV-2/FLU/RSV testing.  Fact Sheet for Patients: EntrepreneurPulse.com.au  Fact Sheet for Healthcare Providers: IncredibleEmployment.be  This test is not yet approved or cleared by the [place] FDA and has been authorized for detection and/or diagnosis of SARS-CoV-2 by FDA under an Emergency Use Authorization (EUA).  This EUA will remain in effect (meaning this test can be used) for the duration of the COVID-19 declaration under Section 564(b)(1) of the Act, 21 U.S.C. section 360bbb-3(b)(1), unless the authorization is terminated or revoked.  Performed at [facility], [place] [address] [place], [place] [address]   Blood culture (routine x 2)     Status: None   Collection Time: 03/28/21  5:37 PM   Specimen: BLOOD  Result Value Ref Range Status   Specimen Description   Final    BLOOD BLOOD LEFT WRIST Performed at [facility] [address], [place], [place] [address]    Special Requests   Final    BOTTLES DRAWN AEROBIC AND ANAEROBIC Blood Culture results may not be optimal due to an inadequate volume of blood received in culture bottles Performed at [place] [address] [place], [place] [address]    Culture   Final    NO GROWTH 5 DAYS Performed at [facility], [place] [address] [place], [place] [address]    Report Status 04/02/2021 FINAL  Final  Resp Panel by RT-PCR (Flu A&B, Covid) Nasopharyngeal Swab     Status: None   Collection Time: 03/30/21  2:39 PM   Specimen: Nasopharyngeal Swab; Nasopharyngeal(NP) swabs in vial transport medium  Result Value Ref Range Status   SARS Coronavirus 2 by RT PCR NEGATIVE NEGATIVE Final     Comment: (NOTE) SARS-CoV-2 target nucleic acids are NOT DETECTED.  The SARS-CoV-2 RNA is generally detectable in upper respiratory specimens during the acute phase of infection. The lowest concentration of SARS-CoV-2 viral copies this assay can detect is 138 copies/mL. A negative result does not preclude SARS-Cov-2 infection and should not be used as the sole basis for treatment or other patient management decisions. A negative result may occur with  improper specimen collection/handling, submission of specimen other than nasopharyngeal swab, presence of viral mutation(s) within the areas targeted by this assay, and inadequate number of viral copies(<138 copies/mL). A negative result must be combined with clinical observations, patient history, and epidemiological information. The expected result is Negative.  Fact Sheet for Patients:  EntrepreneurPulse.com.au  Fact Sheet for Healthcare Providers:  IncredibleEmployment.be  This test is no t yet approved or cleared by the [place] FDA and  has been authorized for detection and/or diagnosis of SARS-CoV-2 by FDA under an Emergency Use Authorization (EUA). This EUA will remain  in effect (meaning this test can be used) for the duration of the COVID-19 declaration under Section 564(b)(1) of the Act, 21 U.S.C.section 360bbb-3(b)(1), unless the authorization is terminated  or revoked sooner.       Influenza A by PCR NEGATIVE NEGATIVE Final   Influenza B by PCR NEGATIVE NEGATIVE Final    Comment: (NOTE) The Xpert Xpress SARS-CoV-2/FLU/RSV plus assay is intended as an aid in the diagnosis of influenza from Nasopharyngeal swab specimens and should not be used as a sole basis for treatment. Nasal washings and aspirates are unacceptable for Xpert Xpress SARS-CoV-2/FLU/RSV testing.  Fact Sheet for Patients: EntrepreneurPulse.com.au  Fact Sheet for Healthcare  Providers: IncredibleEmployment.be  This test is not yet approved or cleared by the [place] FDA and has been authorized for detection and/or diagnosis of SARS-CoV-2 by FDA under an Emergency Use Authorization (EUA). This EUA will remain in effect (meaning this test can be used) for the duration of the COVID-19 declaration under Section 564(b)(1) of the Act, 21 U.S.C. section 360bbb-3(b)(1), unless the authorization is  terminated or revoked.  Performed at [facility], [place] [address] [place], [place] [address]   Culture, blood (Routine x 2)     Status: None   Collection Time: 03/30/21  3:20 PM   Specimen: BLOOD  Result Value Ref Range Status   Specimen Description   Final    BLOOD PORTA CATH Performed at [facility] [place] [address] [place], [place] [address]    Special Requests   Final    BOTTLES DRAWN AEROBIC AND ANAEROBIC Blood Culture adequate volume Performed at [facility] [address] [place], [place] [address]    Culture   Final    NO GROWTH 5 DAYS Performed at [facility], [place] [address] [place], [place] [address]    Report Status 04/04/2021 FINAL  Final  Culture, blood (Routine x 2)     Status: None   Collection Time: 03/30/21  3:27 PM   Specimen: BLOOD  Result Value Ref Range Status   Specimen Description   Final    BLOOD LEFT ANTECUBITAL Performed at [facility] [address] [place], [place] [address]    Special Requests   Final    BOTTLES DRAWN AEROBIC AND ANAEROBIC Blood Culture results may not be optimal due to an excessive volume of blood received in culture bottles Performed at [place] [address] [place], [place] [address]    Culture   Final    NO GROWTH 5 DAYS Performed at [facility], [place] [address], [place], [place] [address]    Report Status 04/04/2021 FINAL  Final  Culture, blood (Routine X 2)  w Reflex to ID Panel     Status: None (Preliminary result)   Collection Time: 04/02/21  6:26 PM   Specimen: BLOOD  Result Value Ref Range Status   Specimen Description   Final    BLOOD BLOOD LEFT HAND Performed at [facility] [address] [place], [place] [address]    Special Requests   Final    BOTTLES DRAWN AEROBIC ONLY Blood Culture adequate volume Performed at [facility] [address] [place], [place] [address]    Culture   Final    NO GROWTH 2 DAYS Performed at [facility] [address], [place], [place] [address]    Report Status PENDING  Incomplete  Culture, blood (Routine X 2) w Reflex to ID Panel     Status: None (Preliminary result)   Collection Time: 04/02/21  7:43 PM   Specimen: BLOOD LEFT WRIST  Result Value Ref Range Status   Specimen Description   Final    BLOOD LEFT WRIST Performed at [facility] [address], [place], [place] [address]    Special Requests   Final    BOTTLES DRAWN AEROBIC ONLY Blood Culture adequate volume Performed at [facility] [address] [place], [place] [address]    Culture   Final    NO GROWTH 2 DAYS Performed at [facility] [address] [place], [place] [address]    Report Status PENDING  Incomplete  MRSA Next Gen by PCR, Nasal     Status: None   Collection Time: 04/03/21 11:26 AM   Specimen: Nasal Mucosa; Nasal Swab  Result Value Ref Range Status   MRSA by PCR Next Gen NOT DETECTED NOT DETECTED Final    Comment: (NOTE) The GeneXpert MRSA Assay (  FDA approved for NASAL specimens only), is one component of a comprehensive MRSA colonization surveillance program. It is not intended to diagnose MRSA infection nor to guide or monitor treatment for MRSA infections. Test performance is not FDA approved in patients less than 79 years old. Performed at [facility], [place] [address] [place], [place]  [address]           Radiology Studies: No results found.      Scheduled Meds:  carvedilol  12.5 mg Oral BID WC   Chlorhexidine Gluconate Cloth  6 each Topical Daily   dronabinol  5 mg Oral BID AC   enoxaparin (LOVENOX) injection  40 mg Subcutaneous Q24H   gabapentin  300 mg Oral BID   lactose free nutrition  237 mL Oral TID WC   mouth rinse  15 mL Mouth Rinse BID   multivitamin with minerals  1 tablet Oral Daily   sacubitril-valsartan  1 tablet Oral BID   sodium chloride flush  3 mL Intravenous Q12H   Continuous Infusions:  ceFEPime (MAXIPIME) IV 2 g (04/04/21 1237)     LOS: 4 days        Hosie Poisson, MD [facility]   To contact the attending provider between 7A-7P or the covering provider during after hours 7P-7A, please log into the web site [email or web address] and access using universal [place] password for that web site. If you do not have the password, please call the hospital operator.  04/04/2021, 4:58 PM

## 2021-04-04 NOTE — Progress Notes (Signed)
Patient requested something for sleep. MD notified.

## 2021-04-04 NOTE — Progress Notes (Signed)
PT Cancellation Note  Patient Details Name: Marie Church MRN: [id number] DOB: [birth year]   Cancelled Treatment:    Reason Eval/Treat Not Completed: Patient declined, no reason specified Pt refused to mobilize today stating she is too fatigued.  Pt encouraged to attempt as able however she continued to decline movement.  Pt to have radiation this afternoon as well.  Pt encouraged to be OOB with nursing as able.   Kati L Payson 04/04/2021, 2:01 PM Arlyce Dice, DPT Acute Rehabilitation Services Pager: [phone] Office: [phone]

## 2021-04-04 NOTE — Progress Notes (Signed)
OT Cancellation Note  Patient Details Name: Marie Church MRN: [id number] DOB: [birth year]   Cancelled Treatment:    Reason Eval/Treat Not Completed: Fatigue/lethargy limiting ability to participate: Pt asleep, able to awaken briefly to name call and gentle tactile cues, but fell back asleep quickly. Able to awaken pt again and pt declined OT due to fatigue. Items brought to pt's room per family member's request. OT will continue efforts.  Julien Girt 04/04/2021, 11:16 AM

## 2021-04-05 ENCOUNTER — Inpatient Hospital Stay (HOSPITAL_COMMUNITY): Payer: Self-pay

## 2021-04-05 LAB — CBC
HCT: 28.6 % — ABNORMAL LOW (ref 36.0–46.0)
Hemoglobin: 8.7 g/dL — ABNORMAL LOW (ref 12.0–15.0)
MCH: 26.5 pg (ref 26.0–34.0)
MCHC: 30.4 g/dL (ref 30.0–36.0)
MCV: 87.2 fL (ref 80.0–100.0)
Platelets: 120 10*3/uL — ABNORMAL LOW (ref 150–400)
RBC: 3.28 MIL/uL — ABNORMAL LOW (ref 3.87–5.11)
RDW: 16.1 % — ABNORMAL HIGH (ref 11.5–15.5)
WBC: 4.5 10*3/uL (ref 4.0–10.5)
nRBC: 0 % (ref 0.0–0.2)

## 2021-04-05 LAB — BLOOD GAS, ARTERIAL
Acid-Base Excess: 6.3 mmol/L — ABNORMAL HIGH (ref 0.0–2.0)
Bicarbonate: 32.4 mmol/L — ABNORMAL HIGH (ref 20.0–28.0)
O2 Saturation: 95.4 %
Patient temperature: 98.6
pCO2 arterial: 59.3 mmHg — ABNORMAL HIGH (ref 32.0–48.0)
pH, Arterial: 7.356 (ref 7.350–7.450)
pO2, Arterial: 79.7 mmHg — ABNORMAL LOW (ref 83.0–108.0)

## 2021-04-05 LAB — PROCALCITONIN: Procalcitonin: 0.18 ng/mL

## 2021-04-05 LAB — AMMONIA: Ammonia: 24 umol/L (ref 9–35)

## 2021-04-05 LAB — BASIC METABOLIC PANEL
Anion gap: 3 — ABNORMAL LOW (ref 5–15)
BUN: 9 mg/dL (ref 8–23)
CO2: 32 mmol/L (ref 22–32)
Calcium: 8.4 mg/dL — ABNORMAL LOW (ref 8.9–10.3)
Chloride: 100 mmol/L (ref 98–111)
Creatinine, Ser: 0.51 mg/dL (ref 0.44–1.00)
GFR, Estimated: >60 mL/min (ref >60)
Glucose, Bld: 102 mg/dL — ABNORMAL HIGH (ref 70–99)
Potassium: 3.9 mmol/L (ref 3.5–5.1)
Sodium: 135 mmol/L (ref 135–145)

## 2021-04-05 LAB — TSH: TSH: 1.143 u[IU]/mL (ref 0.350–4.500)

## 2021-04-05 MED ORDER — MELATONIN 3 MG PO TABS
3.0000 mg | ORAL_TABLET | Freq: Every day | ORAL | Status: DC
  Administered 2021-04-07 – 2021-04-14 (×8): 3 mg via ORAL
  Filled 2021-04-05 (×8): qty 1

## 2021-04-05 NOTE — Progress Notes (Signed)
PROGRESS NOTE    Marie Church  WJX:[id number] DOB: 07-Aug-1955 DOA: 03/30/2021 PCP: System, Provider Not In  Chief Complaint  Patient presents with   Pneumonia    Brief Narrative:  65 year old lady with prior history of malignant right breast cancer with axillary node involvement on active Kadcyla and radiation treatment, chronic systolic heart failure with improvement of left ventricular ejection fraction from 25% to 50%, type 2 diabetes mellitus, hypertension, anemia, thrombocytopenia presents to ED on 03/30/2021 for shortness of breath.  CT angiogram of the chest shows diffuse bilateral groundglass opacities consistent with pulmonary edema versus infectious process.  Patient was probably started on broad-spectrum IV antibiotics and referred to [facility] for admission for acute respiratory failure with hypoxia secondary to pneumonia in the setting of failed outpatient antibiotic treatment.  Pt seen and examined, she continues to have low grade fevers, despite escalation of antibiotics. Her last CXR from 11/16 shows worsening aeration, then we changed antibiotics to vancomycin and cefepime . She remains on 2 lit of [place] oxygen but continues to have some low grade fevers.   Assessment & Plan:   Principal Problem:   Sepsis due to pneumonia [facility]) Active Problems:   Anemia   Malignant neoplasm of upper-outer quadrant of right breast in female, estrogen receptor negative (HCC)   HFrEF (heart failure with reduced ejection fraction) (HCC)   Hyponatremia   Thrombocytopenia (HCC)   Acute respiratory failure (HCC)   Acute respiratory failure with hypoxia, sepsis secondary to bilateral pneumonia present on admission. Differentials include radiation pneumonitis vs metastatic disease.  -in the setting of failed outpatient treatment. -Patient continues to require nasal cannula oxygen to keep sats greater than 90%. She is currently on 2 lit of [place] oxygen.  -CT angiogram of the chest showed diffuse  ground glass opacities doe on 03/30/21 -COVID-19 PCR, influenza PCR is negative, urine for strep pneumonia is negative,. -Patient febrile on 11/15 night, repeat cultures ordered and repeat chest x-ray ordered.Re peat CXR shows worsening aeration of the lung . Interstitial and patchy airspace opacities have increased most evident in the right mid to lower lung. Changed the antibiotics to vancomycin and cefepime. MRSA is negative. Continue with cefepime.  she continues to have low grade fevers, despite escalation of antibiotics. Her last CXR from 11/16 shows worsening aeration, then we changed antibiotics to vancomycin and cefepime . She remains on 2 lit of [place] oxygen but continues to have some low grade fevers.  Recheck CXR, blood cultures, UA, ABG, and procalcitonin level.  Therapy evaluations recommending home health PT.    Hyponatremia Suspect hypovolemic hyponatremia in the setting of dehydration. Continue to monitor.   Chronic systolic heart failure/history of nonischemic cardiomyopathy. Echocardiogram on 03/03/2021 showed improvement in left ventricular ejection fraction to 50% compared to the previous echo showing left ventricular ejection fraction at 20 to 25%. Continue with Coreg at 12.5 mg twice daily, Entresto 24-26 mg twice daily. Holding Lasix and spironolactone for now.      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 She has one more week of radiation treatment.     Abdominal pain Improved.  abdominal x-ray does not show any bowel obstruction.    Pulmonary nodules Recommend outpatient follow-up to exclude metastatic disease.   Anemia/thrombocytopenia Probably secondary to chemotherapy Continue to monitor counts. Hemoglobin stable around 8 and  platelet counts  are improving.  Poor appetite and nausea:  Add marinol.  Nausea is better.     DVT prophylaxis: (Lovenox) Code Status: (Full code) Family Communication: discussed with family at bedside.  Disposition:   Status is: Inpatient  Remains inpatient appropriate because: IV antibiotics.        Consultants:  None  Procedures: None  Antimicrobials:  Antibiotics Given (last 72 hours)     Date/Time Action Medication Dose Rate   04/02/21 1726 New Bag/Given   azithromycin (ZITHROMAX) 500 mg in sodium chloride 0.9 % 250 mL IVPB 500 mg 250 mL/hr   04/03/21 0211 New Bag/Given   cefTRIAXone (ROCEPHIN) 2 g in sodium chloride 0.9 % 100 mL IVPB 2 g 200 mL/hr   04/03/21 1113 New Bag/Given   ceFEPIme (MAXIPIME) 2 g in sodium chloride 0.9 % 100 mL IVPB 2 g 200 mL/hr   04/03/21 1216 New Bag/Given   vancomycin (VANCOREADY) IVPB 1500 mg/300 mL 1,500 mg 150 mL/hr   04/03/21 2000 New Bag/Given   ceFEPIme (MAXIPIME) 2 g in sodium chloride 0.9 % 100 mL IVPB 2 g 200 mL/hr   04/03/21 2127 New Bag/Given   vancomycin (VANCOREADY) IVPB 1750 mg/350 mL 1,750 mg 175 mL/hr   04/04/21 0311 New Bag/Given   ceFEPIme (MAXIPIME) 2 g in sodium chloride 0.9 % 100 mL IVPB 2 g 200 mL/hr   04/04/21 1237 New Bag/Given   ceFEPIme (MAXIPIME) 2 g in sodium chloride 0.9 % 100 mL IVPB 2 g 200 mL/hr   04/04/21 2025 New Bag/Given   ceFEPIme (MAXIPIME) 2 g in sodium chloride 0.9 % 100 mL IVPB 2 g 200 mL/hr   04/05/21 0432 New Bag/Given   ceFEPIme (MAXIPIME) 2 g in sodium chloride 0.9 % 100 mL IVPB 2 g 200 mL/hr         Subjective: Sleepy , reports did not sleep last night. Had melatonin earlier this am, and has been catching up on her sleep.    Objective: Vitals:   04/04/21 2316 04/04/21 2327 04/05/21 0602 04/05/21 1020  BP:  (!) 145/76 (!) 157/94 (!) 159/86  Pulse:  96 95   Resp:  20 (!) 24   Temp: (!) 100.7 F (38.2 C) (!) 100.7 F (38.2 C) 99.2 F (37.3 C)   TempSrc: Oral Oral Oral    SpO2:  95% 96%   Weight:      Height:        Intake/Output Summary (Last 24 hours) at 04/05/2021 1252 Last data filed at 04/04/2021 2300 Gross per 24 hour  Intake 900 ml  Output --  Net 900 ml    Filed Weights   04/02/21 0607 04/03/21 0500 04/04/21 0500  Weight: 76.8 kg 79.1 kg 76.7 kg    Examination:  General exam: Ill appearing lady, on 2 lit of [place] oxygen. Not in distress.  Respiratory system: diminished air entry at bases, no wheezing heard on 2lit of  Oxygen.  Cardiovascular system: S1 & S2 heard, RRR. No JVD,  No pedal edema. Gastrointestinal system: Abdomen is nondistended, soft and nontender.  Normal bowel sounds heard. Central nervous system: sleepy, lethargic, but answering all questions appropriately. No focal neurological deficits. Extremities: Symmetric 5 x 5 power. Skin: No rashes, lesions or ulcers Psychiatry:  Mood & affect appropriate.       Data Reviewed: I have personally reviewed following labs and imaging studies  CBC: Recent Labs  Lab 03/30/21 1439 03/31/21 0349 04/02/21 0527 04/03/21 0502 04/04/21 0359 04/05/21 0459  WBC 7.9 6.7 7.2  6.1 6.1 4.5  NEUTROABS 5.1  --   --   --   --   --   HGB 10.0* 9.0* 9.0* 8.8* 8.9* 8.7*  HCT 31.4* 29.0* 29.5* 28.7* 29.5* 28.6*  MCV 82.2 83.8 85.5 86.4 86.5 87.2  PLT 121* 116* 118* 118* 126* 120*     Basic Metabolic Panel: Recent Labs  Lab 03/31/21 0349 04/01/21 0307 04/02/21 0527 04/04/21 0359 04/05/21 0459  NA 135 133* 132* 132* 135  K 3.7 3.5 4.3 3.9 3.9  CL 99 99 99 99 100  CO2 28 29 28 31  32  GLUCOSE 103* 106* 102* 110* 102*  BUN 7* 7* 8 7* 9  CREATININE 0.45 0.43* 0.55 0.52 0.51  CALCIUM 8.2* 8.0* 8.5* 8.0* 8.4*  MG  --  2.0 2.1  --   --      GFR: Estimated Creatinine Clearance: 75.4 mL/min (by C-G formula based on SCr of 0.51 mg/dL).  Liver Function Tests: Recent Labs  Lab 03/30/21 1439  AST 61*  ALT 21  ALKPHOS 50  BILITOT 0.6  PROT 9.1*  ALBUMIN 2.9*      CBG: Recent Labs  Lab 03/30/21 2158 04/01/21 0734 04/01/21 1143  GLUCAP 87 117* 127*      Recent Results (from the past 240 hour(s))  Blood culture (routine x 2)     Status: None   Collection Time: 03/28/21  5:32 PM   Specimen: BLOOD  Result Value Ref Range Status   Specimen Description   Final    BLOOD LEFT ANTECUBITAL Performed at [facility], [place] [address] [place], [place] [address]    Special Requests   Final    BOTTLES DRAWN AEROBIC AND ANAEROBIC Blood Culture results may not be optimal due to an inadequate volume of blood received in culture bottles Performed at [place] [address], [place], [place] [address]    Culture   Final    NO GROWTH 5 DAYS Performed at [facility], [place] [address], [place], [place] [address]    Report Status 04/02/2021 FINAL  Final  Resp Panel by RT-PCR (Flu A&B, Covid) Nasopharyngeal Swab     Status: None   Collection Time: 03/28/21  5:32 PM   Specimen: Nasopharyngeal Swab; Nasopharyngeal(NP) swabs in vial transport medium  Result Value Ref Range Status   SARS Coronavirus 2 by RT PCR NEGATIVE NEGATIVE Final    Comment: (NOTE) SARS-CoV-2 target nucleic acids are NOT DETECTED.  The SARS-CoV-2 RNA is generally detectable in upper respiratory specimens during the acute phase of infection. The lowest concentration of SARS-CoV-2 viral copies this assay can detect is 138 copies/mL. A negative result does not preclude SARS-Cov-2 infection and should not be used as the sole basis for treatment or other patient management decisions. A negative result may occur with  improper specimen collection/handling, submission of specimen other than nasopharyngeal swab, presence of viral mutation(s) within the areas targeted by this assay, and inadequate number of viral copies(<138 copies/mL). A negative result must be combined with clinical observations, patient history, and  epidemiological information. The expected result is Negative.  Fact Sheet for Patients:  EntrepreneurPulse.com.au  Fact Sheet for Healthcare Providers:  IncredibleEmployment.be  This test is no t yet approved or cleared by the [place] FDA and  has been authorized for detection and/or diagnosis of SARS-CoV-2 by FDA under an Emergency Use Authorization (EUA). This EUA will remain  in effect (meaning this test can be used) for the  duration of the COVID-19 declaration under Section 564(b)(1) of the Act, 21 U.S.C.section 360bbb-3(b)(1), unless the authorization is terminated  or revoked sooner.       Influenza A by PCR NEGATIVE NEGATIVE Final   Influenza B by PCR NEGATIVE NEGATIVE Final    Comment: (NOTE) The Xpert Xpress SARS-CoV-2/FLU/RSV plus assay is intended as an aid in the diagnosis of influenza from Nasopharyngeal swab specimens and should not be used as a sole basis for treatment. Nasal washings and aspirates are unacceptable for Xpert Xpress SARS-CoV-2/FLU/RSV testing.  Fact Sheet for Patients: EntrepreneurPulse.com.au  Fact Sheet for Healthcare Providers: IncredibleEmployment.be  This test is not yet approved or cleared by the [place] FDA and has been authorized for detection and/or diagnosis of SARS-CoV-2 by FDA under an Emergency Use Authorization (EUA). This EUA will remain in effect (meaning this test can be used) for the duration of the COVID-19 declaration under Section 564(b)(1) of the Act, 21 U.S.C. section 360bbb-3(b)(1), unless the authorization is terminated or revoked.  Performed at [facility], [place] [address] [place], [place] [address]   Blood culture (routine x 2)     Status: None   Collection Time: 03/28/21  5:37 PM   Specimen: BLOOD  Result Value Ref Range Status   Specimen Description   Final    BLOOD BLOOD LEFT WRIST Performed at [place] [address], [place], [place] [address]    Special Requests   Final    BOTTLES DRAWN AEROBIC AND ANAEROBIC Blood Culture results may not be optimal due to an inadequate volume of blood received in culture bottles Performed at [place] [address], [place], [place] [address]    Culture   Final    NO GROWTH 5 DAYS Performed at [facility], [place] [address] [place], [place] [address]    Report Status 04/02/2021 FINAL  Final  Resp Panel by RT-PCR (Flu A&B, Covid) Nasopharyngeal Swab     Status: None   Collection Time: 03/30/21  2:39 PM   Specimen: Nasopharyngeal Swab; Nasopharyngeal(NP) swabs in vial transport medium  Result Value Ref Range Status   SARS Coronavirus 2 by RT PCR NEGATIVE NEGATIVE Final    Comment: (NOTE) SARS-CoV-2 target nucleic acids are NOT DETECTED.  The SARS-CoV-2 RNA is generally detectable in upper respiratory specimens during the acute phase of infection. The lowest concentration of SARS-CoV-2 viral copies this assay can detect is 138 copies/mL. A negative result does not preclude SARS-Cov-2 infection and should not be used as the sole basis for treatment or other patient management decisions. A negative result may occur with  improper specimen collection/handling, submission of specimen other than nasopharyngeal swab, presence of viral mutation(s) within the areas targeted by this assay, and inadequate number of viral copies(<138 copies/mL). A negative result must be combined with clinical observations, patient history, and epidemiological information. The expected result is Negative.  Fact Sheet for Patients:  EntrepreneurPulse.com.au  Fact Sheet for Healthcare Providers:  IncredibleEmployment.be  This test is no t yet approved or cleared by the [place] FDA and  has been authorized for detection and/or diagnosis of SARS-CoV-2 by FDA under an Emergency Use  Authorization (EUA). This EUA will remain  in effect (meaning this test can be used) for the duration of the COVID-19 declaration under Section 564(b)(1) of the Act, 21 U.S.C.section 360bbb-3(b)(1), unless the authorization is terminated  or revoked sooner.       Influenza A by PCR  NEGATIVE NEGATIVE Final   Influenza B by PCR NEGATIVE NEGATIVE Final    Comment: (NOTE) The Xpert Xpress SARS-CoV-2/FLU/RSV plus assay is intended as an aid in the diagnosis of influenza from Nasopharyngeal swab specimens and should not be used as a sole basis for treatment. Nasal washings and aspirates are unacceptable for Xpert Xpress SARS-CoV-2/FLU/RSV testing.  Fact Sheet for Patients: EntrepreneurPulse.com.au  Fact Sheet for Healthcare Providers: IncredibleEmployment.be  This test is not yet approved or cleared by the [place] FDA and has been authorized for detection and/or diagnosis of SARS-CoV-2 by FDA under an Emergency Use Authorization (EUA). This EUA will remain in effect (meaning this test can be used) for the duration of the COVID-19 declaration under Section 564(b)(1) of the Act, 21 U.S.C. section 360bbb-3(b)(1), unless the authorization is terminated or revoked.  Performed at [facility], [place] [address], [place], [place] [address]   Culture, blood (Routine x 2)     Status: None   Collection Time: 03/30/21  3:20 PM   Specimen: BLOOD  Result Value Ref Range Status   Specimen Description   Final    BLOOD PORTA CATH Performed at [place] [address] [place], [place] [address]    Special Requests   Final    BOTTLES DRAWN AEROBIC AND ANAEROBIC Blood Culture adequate volume Performed at [place] [address], [place], [place] [address]    Culture   Final    NO GROWTH 5 DAYS Performed at [facility], [place] [address] [place], [place] [address]    Report Status  04/04/2021 FINAL  Final  Culture, blood (Routine x 2)     Status: None   Collection Time: 03/30/21  3:27 PM   Specimen: BLOOD  Result Value Ref Range Status   Specimen Description   Final    BLOOD LEFT ANTECUBITAL Performed at [place] [address], [place], [place] [address]    Special Requests   Final    BOTTLES DRAWN AEROBIC AND ANAEROBIC Blood Culture results may not be optimal due to an excessive volume of blood received in culture bottles Performed at [place] [address], [place], [place] [address]    Culture   Final    NO GROWTH 5 DAYS Performed at [facility], [place] [address] [place], [place] [address]    Report Status 04/04/2021 FINAL  Final  Culture, blood (Routine X 2) w Reflex to ID Panel     Status: None (Preliminary result)   Collection Time: 04/02/21  6:26 PM   Specimen: BLOOD  Result Value Ref Range Status   Specimen Description   Final    BLOOD BLOOD LEFT HAND Performed at [place] [address] [place], [place] [address]    Special Requests   Final    BOTTLES DRAWN AEROBIC ONLY Blood Culture adequate volume Performed at [place] [address], [place], [place] [address]    Culture   Final    NO GROWTH 2 DAYS Performed at [place] [address], [place], [place] [address]    Report Status PENDING  Incomplete  Culture, blood (Routine X 2) w Reflex to ID Panel     Status: None (Preliminary result)   Collection Time: 04/02/21  7:43 PM   Specimen: BLOOD LEFT WRIST  Result Value Ref Range Status   Specimen Description   Final  BLOOD LEFT WRIST Performed at [facility] [address], [place], [place] [address]    Special Requests   Final    BOTTLES DRAWN AEROBIC ONLY Blood Culture adequate volume Performed at [place] [address], [place], [place] [address]    Culture   Final    NO  GROWTH 2 DAYS Performed at [facility] [address], [place], [place] [address]    Report Status PENDING  Incomplete  MRSA Next Gen by PCR, Nasal     Status: None   Collection Time: 04/03/21 11:26 AM   Specimen: Nasal Mucosa; Nasal Swab  Result Value Ref Range Status   MRSA by PCR Next Gen NOT DETECTED NOT DETECTED Final    Comment: (NOTE) The GeneXpert MRSA Assay (FDA approved for NASAL specimens only), is one component of a comprehensive MRSA colonization surveillance program. It is not intended to diagnose MRSA infection nor to guide or monitor treatment for MRSA infections. Test performance is not FDA approved in patients less than 51 years old. Performed at [facility], [place] [address], [place], [place] [address]           Radiology Studies: No results found.      Scheduled Meds:  carvedilol  12.5 mg Oral BID WC   Chlorhexidine Gluconate Cloth  6 each Topical Daily   dronabinol  5 mg Oral BID AC   enoxaparin (LOVENOX) injection  40 mg Subcutaneous Q24H   lactose free nutrition  237 mL Oral TID WC   mouth rinse  15 mL Mouth Rinse BID   melatonin  3 mg Oral QHS   multivitamin with minerals  1 tablet Oral Daily   sacubitril-valsartan  1 tablet Oral BID   sodium chloride flush  3 mL Intravenous Q12H   Continuous Infusions:  ceFEPime (MAXIPIME) IV 2 g (04/05/21 0432)     LOS: 5 days        Hosie Poisson, MD [facility]   To contact the attending provider between 7A-7P or the covering provider during after hours 7P-7A, please log into the web site [email or web address] and access using universal [place] password for that web site. If you do not have the password, please call the hospital operator.  04/05/2021, 12:52 PM

## 2021-04-06 ENCOUNTER — Inpatient Hospital Stay (HOSPITAL_COMMUNITY): Payer: Self-pay

## 2021-04-06 LAB — BASIC METABOLIC PANEL
Anion gap: 3 — ABNORMAL LOW (ref 5–15)
BUN: 7 mg/dL — ABNORMAL LOW (ref 8–23)
CO2: 33 mmol/L — ABNORMAL HIGH (ref 22–32)
Calcium: 8.1 mg/dL — ABNORMAL LOW (ref 8.9–10.3)
Chloride: 95 mmol/L — ABNORMAL LOW (ref 98–111)
Creatinine, Ser: 0.49 mg/dL (ref 0.44–1.00)
GFR, Estimated: >60 mL/min (ref >60)
Glucose, Bld: 111 mg/dL — ABNORMAL HIGH (ref 70–99)
Potassium: 4 mmol/L (ref 3.5–5.1)
Sodium: 131 mmol/L — ABNORMAL LOW (ref 135–145)

## 2021-04-06 LAB — URINALYSIS, ROUTINE W REFLEX MICROSCOPIC
Bilirubin Urine: NEGATIVE
Glucose, UA: NEGATIVE mg/dL
Hgb urine dipstick: NEGATIVE
Ketones, ur: NEGATIVE mg/dL
Leukocytes,Ua: NEGATIVE
Nitrite: NEGATIVE
Protein, ur: NEGATIVE mg/dL
Specific Gravity, Urine: 1.006 (ref 1.005–1.030)
pH: 6 (ref 5.0–8.0)

## 2021-04-06 LAB — BLOOD GAS, ARTERIAL
Acid-Base Excess: 8 mmol/L — ABNORMAL HIGH (ref 0.0–2.0)
Bicarbonate: 34.6 mmol/L — ABNORMAL HIGH (ref 20.0–28.0)
O2 Saturation: 94 %
Patient temperature: 102.2
pCO2 arterial: 74.4 mmHg (ref 32.0–48.0)
pH, Arterial: 7.302 — ABNORMAL LOW (ref 7.350–7.450)
pO2, Arterial: 83.1 mmHg (ref 83.0–108.0)

## 2021-04-06 LAB — CBC
HCT: 27.3 % — ABNORMAL LOW (ref 36.0–46.0)
Hemoglobin: 8.2 g/dL — ABNORMAL LOW (ref 12.0–15.0)
MCH: 25.9 pg — ABNORMAL LOW (ref 26.0–34.0)
MCHC: 30 g/dL (ref 30.0–36.0)
MCV: 86.1 fL (ref 80.0–100.0)
Platelets: 114 10*3/uL — ABNORMAL LOW (ref 150–400)
RBC: 3.17 MIL/uL — ABNORMAL LOW (ref 3.87–5.11)
RDW: 16.3 % — ABNORMAL HIGH (ref 11.5–15.5)
WBC: 4.3 10*3/uL (ref 4.0–10.5)
nRBC: 0 % (ref 0.0–0.2)

## 2021-04-06 LAB — EXPECTORATED SPUTUM ASSESSMENT W GRAM STAIN, RFLX TO RESP C

## 2021-04-06 IMAGING — MR MR HEAD W/O CM
11 series · 45 of 48 positions shown · non-contrast
Comparison: None.

CLINICAL DATA: Encephalopathy and fever of uncertain origin

EXAM:
MRI HEAD WITHOUT CONTRAST
TECHNIQUE: Multiplanar, multiecho pulse sequences of the brain and surrounding
structures were obtained without intravenous contrast.

[Series 5: dwi_tracew · axial · 3.0mm · 1.08mm/px · z∈[-90,+59]mm · 8 of 102 slices shown]
[im 1/102]
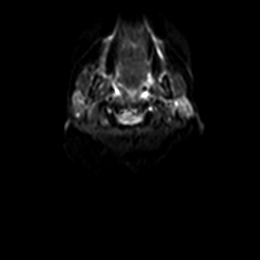
[im 12/102]
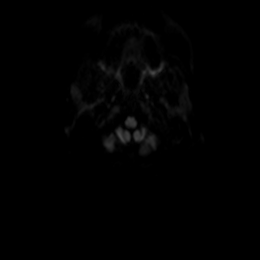
[im 34/102]
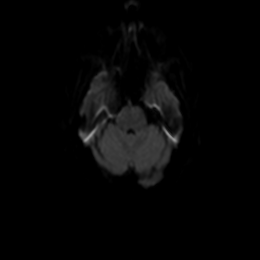
[im 45/102]
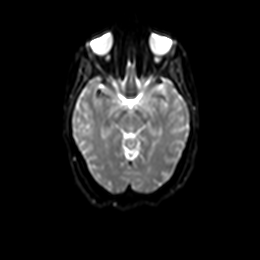
[im 57/102]
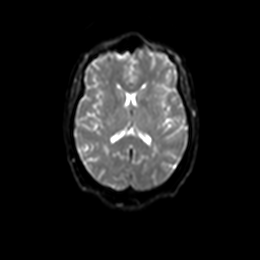
[im 68/102]
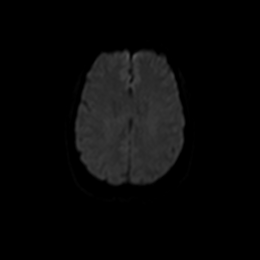
[im 90/102]
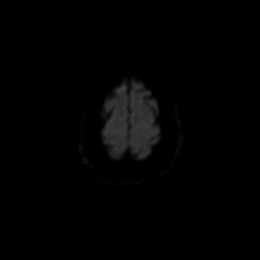
[im 102/102]
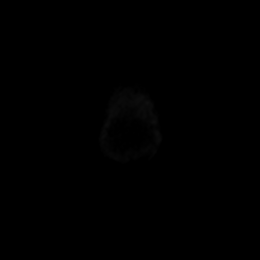

[Series 6: dwi_adc · axial · 3.0mm · 1.08mm/px · z∈[-90,+20]mm · 4 of 51 slices shown]
[im 1/51]
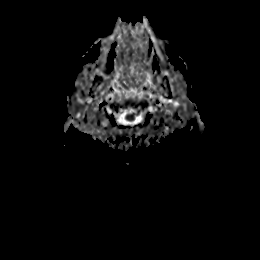
[im 13/51]
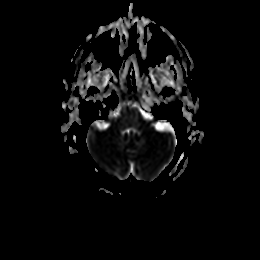
[im 26/51]
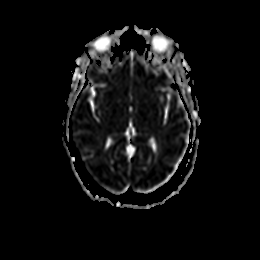
[im 38/51]
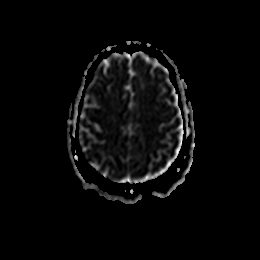

[Series 7: T2 · sagittal · 5.0mm · 0.47mm/px · 2 of 22 slices shown (1 of 4)]
[im 1/22]
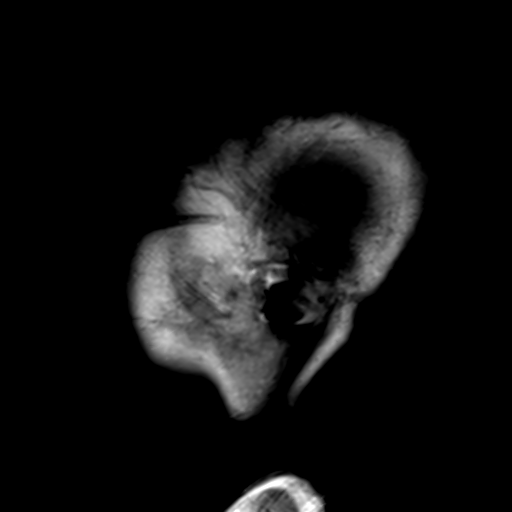
[im 22/22]
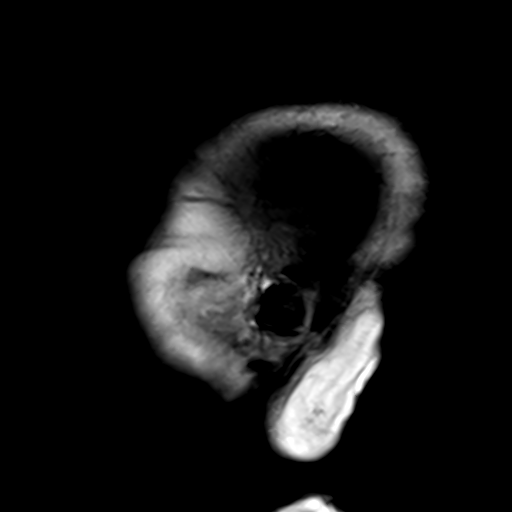

[Series 8: T2 · axial · 5.0mm · 0.45mm/px · z∈[-86,+62]mm · 2 of 24 slices shown (2 of 4)]
[im 1/24]
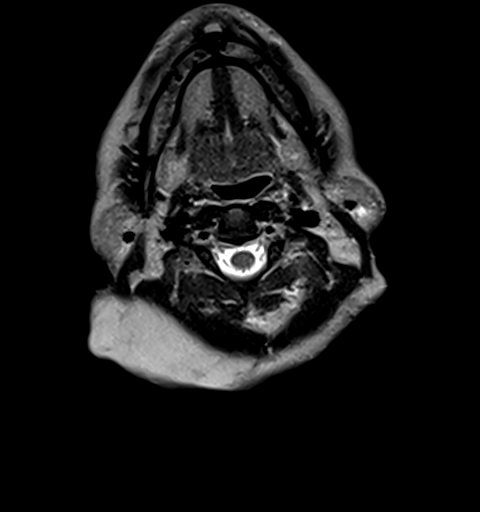
[im 24/24]
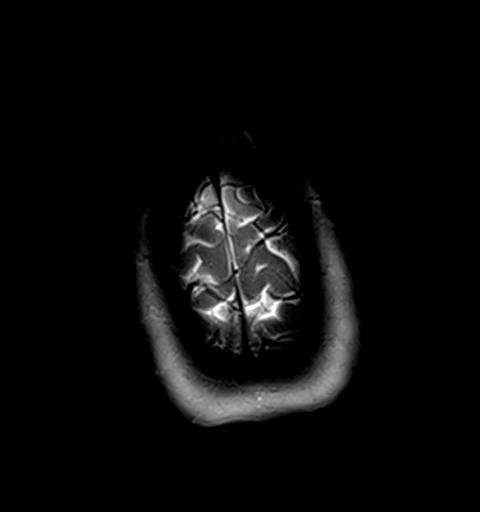

[Series 9: GRE · axial · 3.0mm · 0.45mm/px · z∈[-87,+62]mm · 5 of 51 slices shown]
[im 1/51]
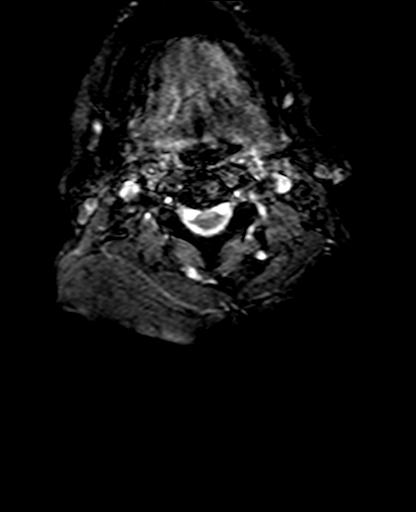
[im 13/51]
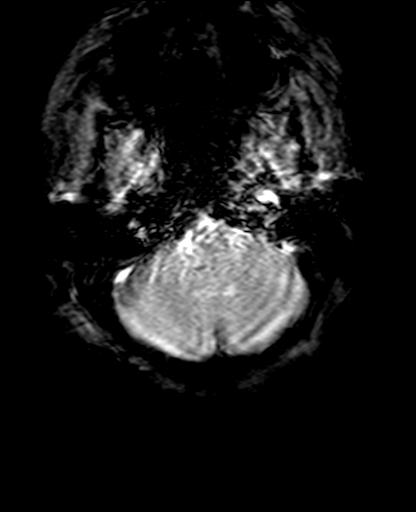
[im 26/51]
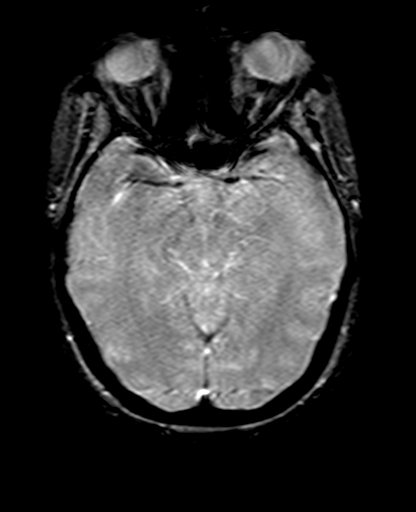
[im 38/51]
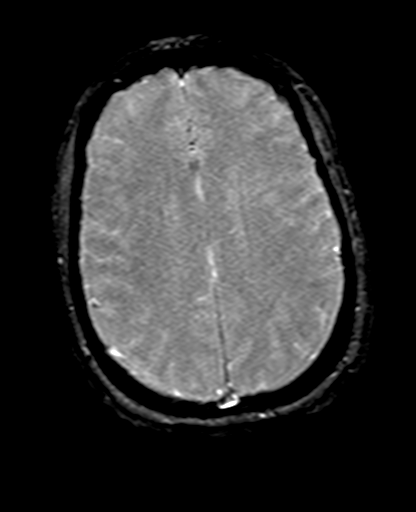
[im 51/51]
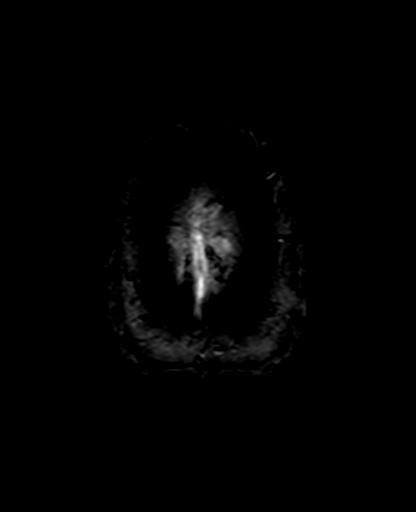

[Series 10: FLAIR · axial · 3.0mm · 0.86mm/px · z∈[-86,+63]mm · 5 of 51 slices shown]
[im 1/51]
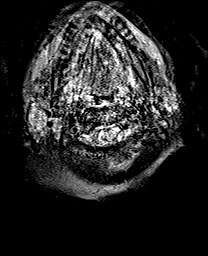
[im 13/51]
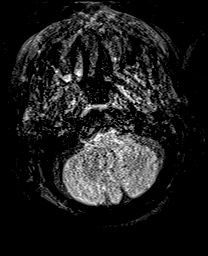
[im 26/51]
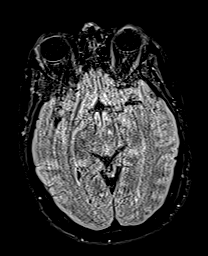
[im 38/51]
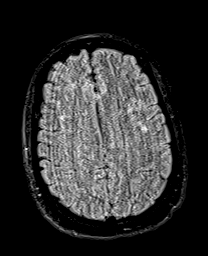
[im 51/51]
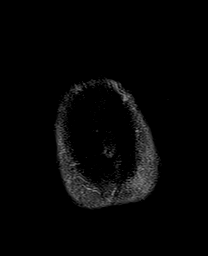

[Series 11: T1 · axial · 3.0mm · 0.45mm/px · z∈[-87,+62]mm · 5 of 51 slices shown]
[im 1/51]
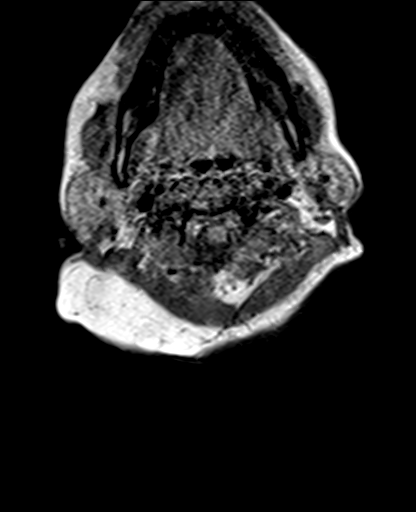
[im 13/51]
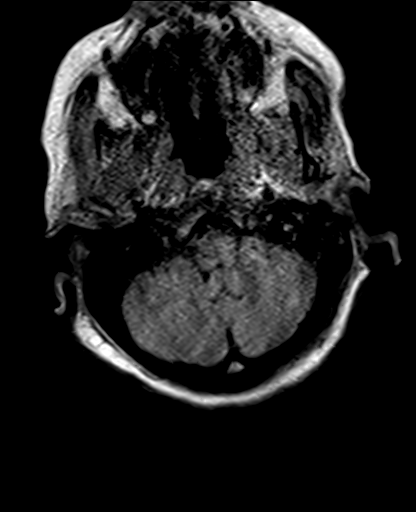
[im 26/51]
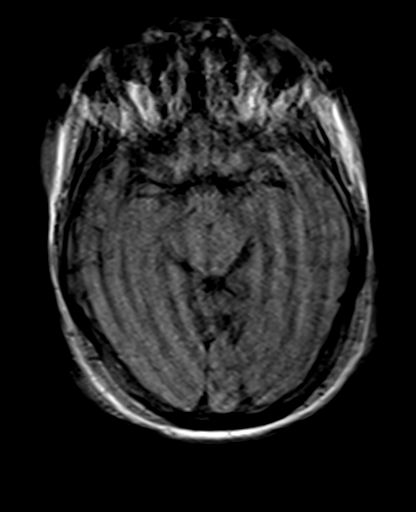
[im 38/51]
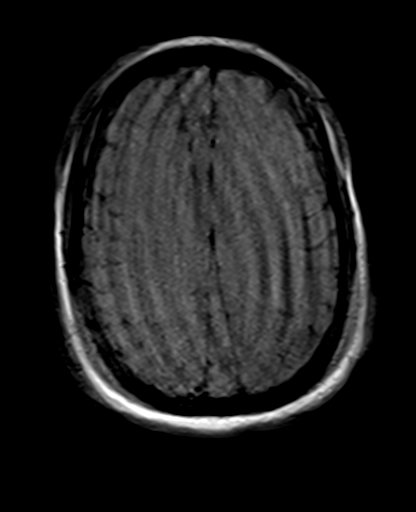
[im 51/51]
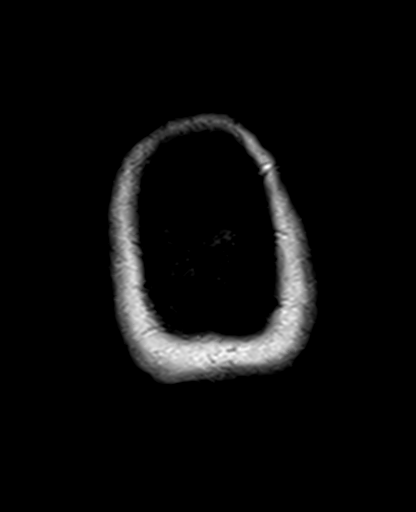

[Series 12: DWI · coronal · 5.0mm · 1.31mm/px · 5 of 56 slices shown (1 of 2)]
[im 1/56]
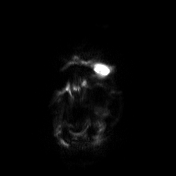
[im 14/56]
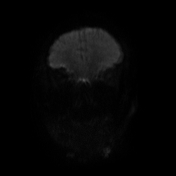
[im 28/56]
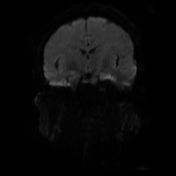
[im 42/56]
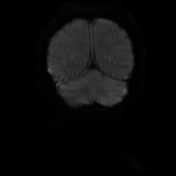
[im 56/56]
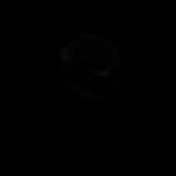

[Series 13: DWI · coronal · 5.0mm · 1.31mm/px · 3 of 28 slices shown (2 of 2)]
[im 1/28]
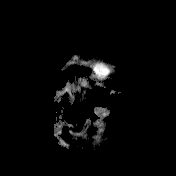
[im 14/28]
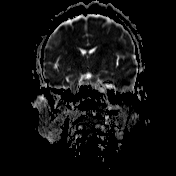
[im 28/28]
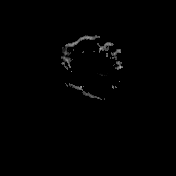

[Series 14: T2 · coronal · 5.0mm · 0.86mm/px · 3 of 29 slices shown (3 of 4)]
[im 1/29]
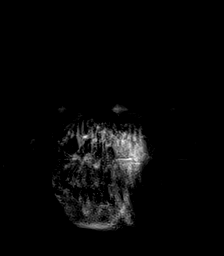
[im 15/29]
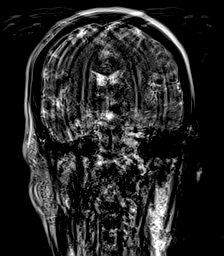
[im 29/29]
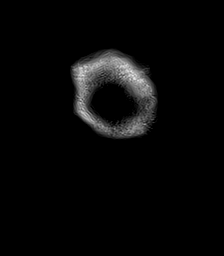

[Series 15: T2 · coronal · 5.0mm · 0.86mm/px · 3 of 29 slices shown (4 of 4)]
[im 1/29]
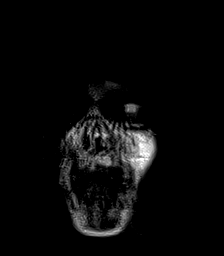
[im 15/29]
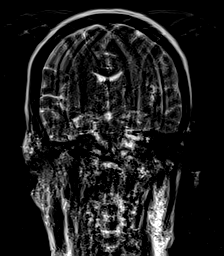
[im 29/29]
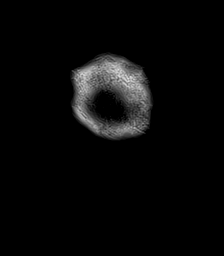

[45 of 48 positions shown; findings below may reference images not displayed]

FINDINGS: Markedly motion degraded study.

Brain: No acute infarct, mass effect or extra-axial collection. No
acute or chronic hemorrhage. Normal white matter signal, parenchymal
volume and CSF spaces. The midline structures are normal.

Vascular: Major flow voids are preserved.

Skull and upper cervical spine: Normal calvarium and skull base.
Visualized upper cervical spine and soft tissues are normal.

Sinuses/Orbits:No paranasal sinus fluid levels or advanced mucosal
thickening. No mastoid or middle ear effusion. Normal orbits.
IMPRESSION: 1. Markedly motion degraded study.
2. No acute intracranial abnormality.

## 2021-04-06 IMAGING — CT CT ABD-PELV W/O CM
2 of 4 series · 16 of 46 positions shown, 18 images · non-contrast
Comparison: [DATE]

CLINICAL DATA: Abdominal pain and fever

EXAM:
CT ABDOMEN AND PELVIS WITHOUT CONTRAST
TECHNIQUE: Multidetector CT imaging of the abdomen and pelvis was performed
following the standard protocol without IV contrast.

[Series 2: axial st · axial · 0.91mm/px · z∈[+908,+1363]mm · 13 of 103 slices shown, 15 images]
[im 6/103  soft-tissue]
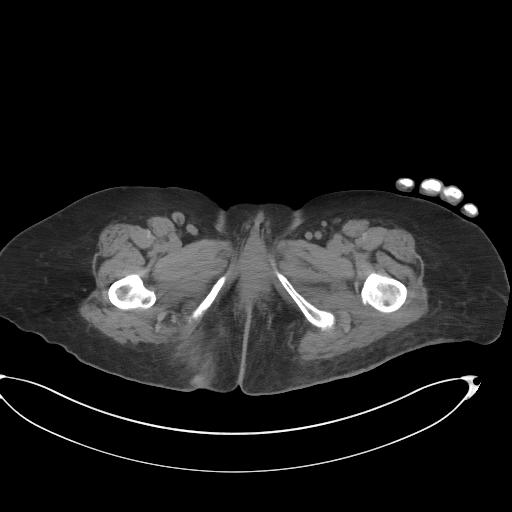
[im 6/103  bone]
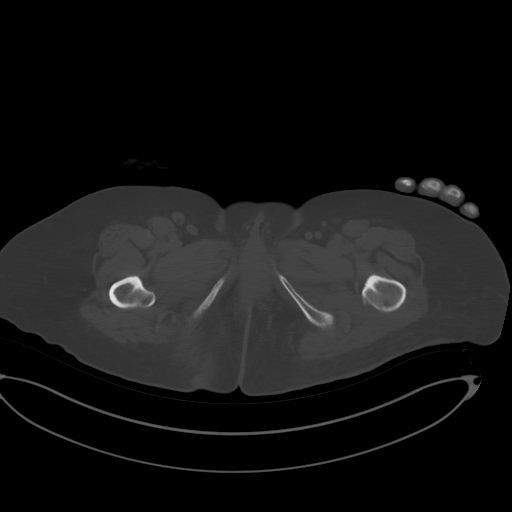
[im 17/103  soft-tissue]
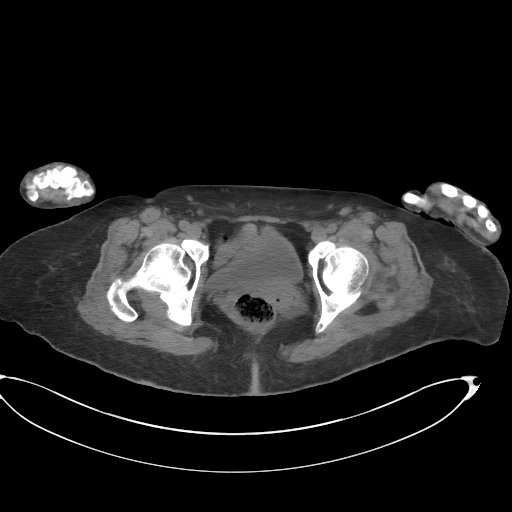
[im 22/103  soft-tissue]
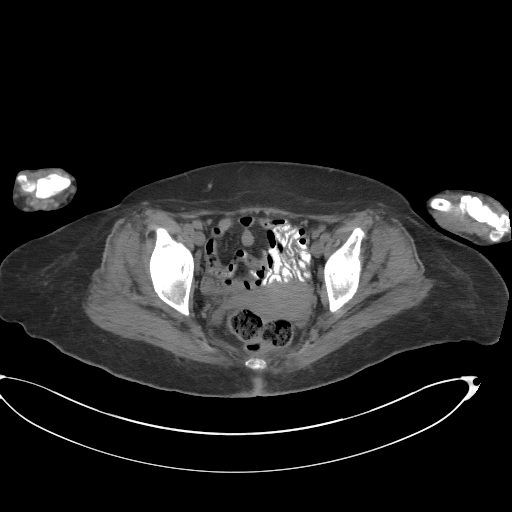
[im 27/103  soft-tissue]
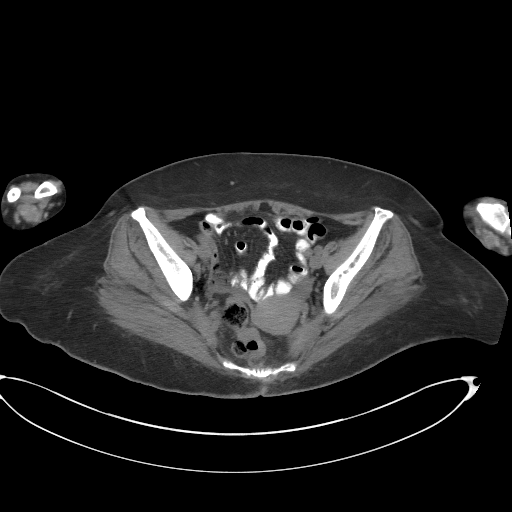
[im 38/103  soft-tissue]
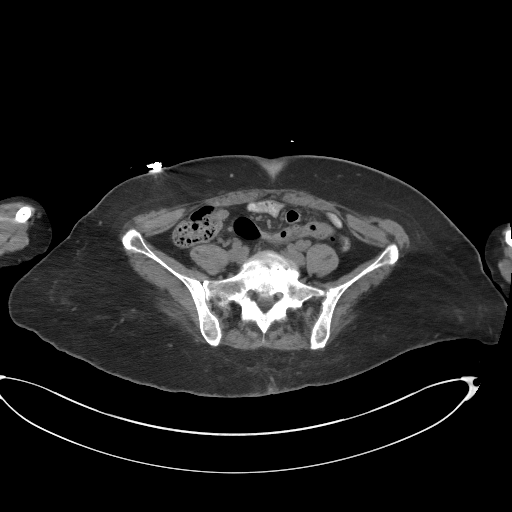
[im 43/103  soft-tissue]
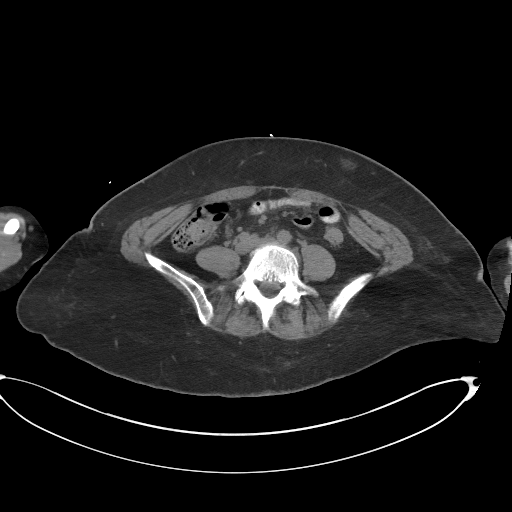
[im 54/103  soft-tissue]
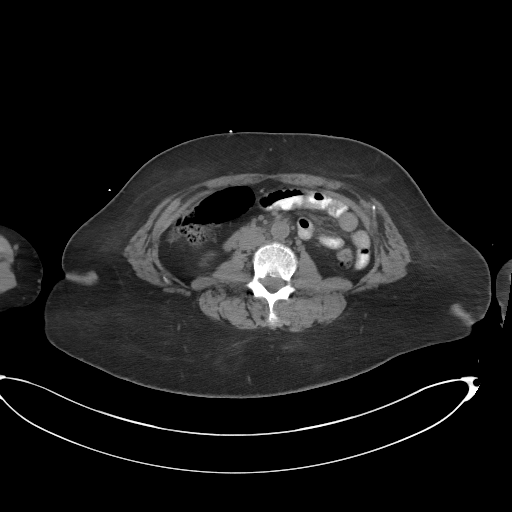
[im 60/103  soft-tissue]
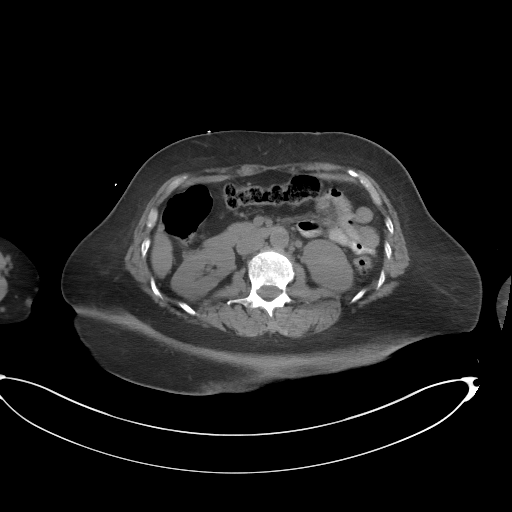
[im 65/103  soft-tissue]
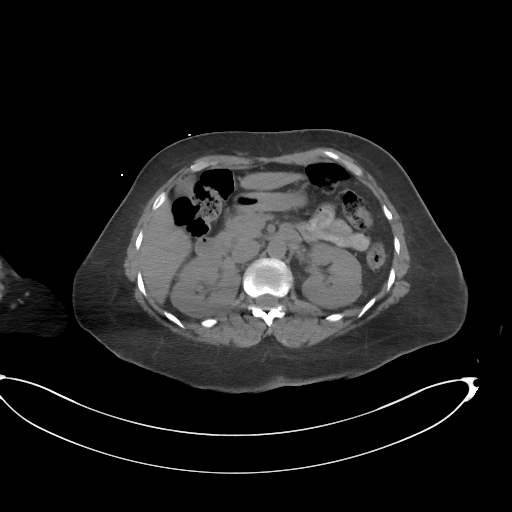
[im 65/103  bone]
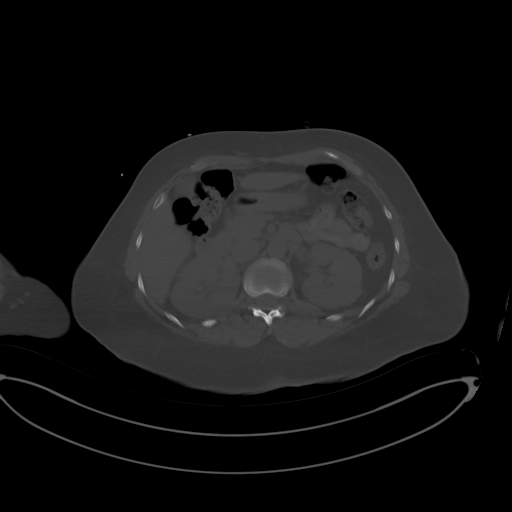
[im 76/103  soft-tissue]
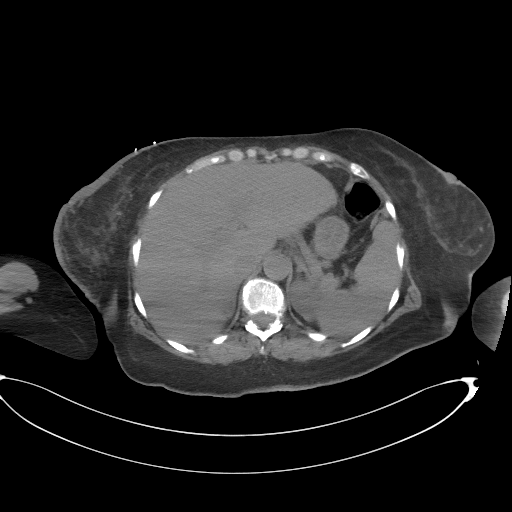
[im 81/103  soft-tissue]
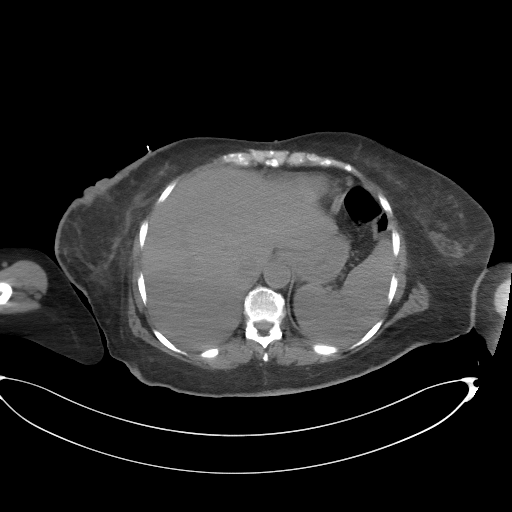
[im 86/103  soft-tissue]
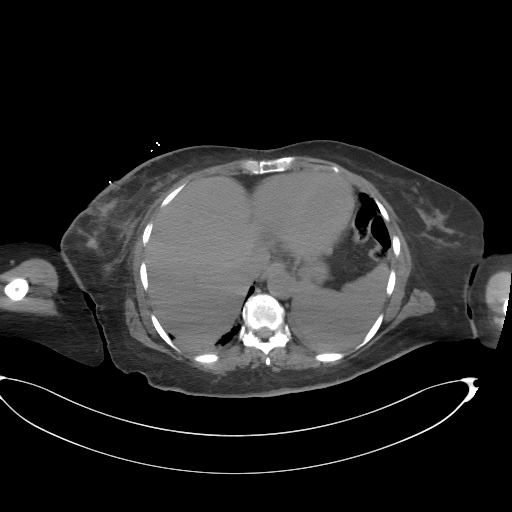
[im 97/103  soft-tissue]
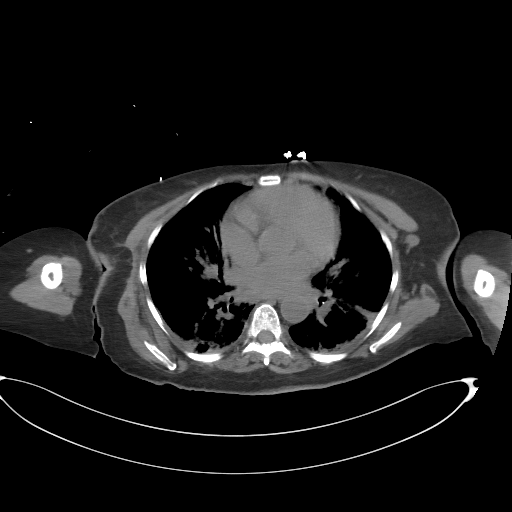

[Series 5: coronal st · coronal · 0.82mm/px · 3 of 91 slices shown]
[im 31/91  soft-tissue]
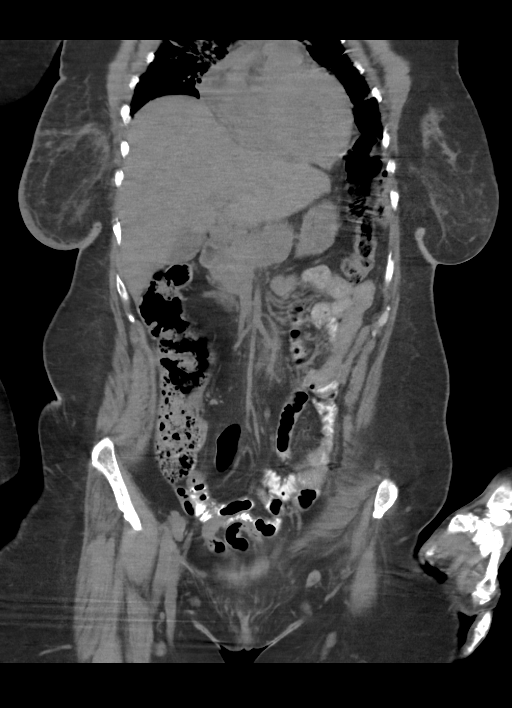
[im 41/91  soft-tissue]
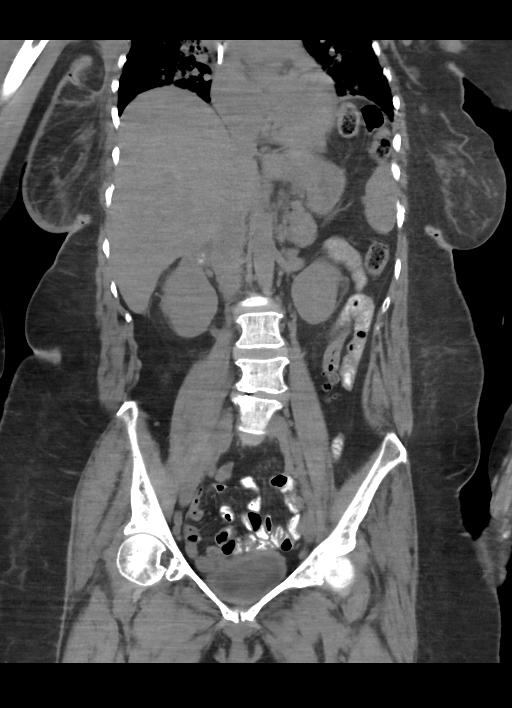
[im 51/91  soft-tissue]
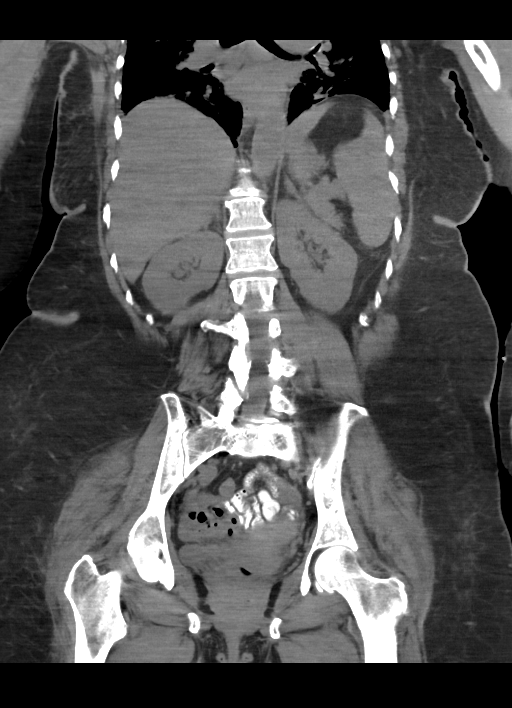

[16 of 46 positions shown; findings below may reference images not displayed]

FINDINGS: LOWER CHEST: Severe bilateral multifocal airspace disease has
worsened

HEPATOBILIARY: Normal hepatic contours. No intra- or extrahepatic
biliary dilatation. The gallbladder is normal.

PANCREAS: Normal pancreas. No ductal dilatation or peripancreatic
fluid collection.

SPLEEN: Normal.

ADRENALS/URINARY TRACT: The adrenal glands are normal. No
hydronephrosis, nephroureterolithiasis or solid renal mass. The
urinary bladder is normal for degree of distention

STOMACH/BOWEL: There is no hiatal hernia. Normal duodenal course and
caliber. No small bowel dilatation or inflammation. No focal colonic
abnormality. Normal appendix.

VASCULAR/LYMPHATIC: Normal course and caliber of the major abdominal
vessels. No abdominal or pelvic lymphadenopathy.

REPRODUCTIVE: Normal uterus. No adnexal mass.

MUSCULOSKELETAL. No bony spinal canal stenosis or focal osseous
abnormality.

OTHER: Post treatment changes of the right breast.
IMPRESSION: 1. No acute abnormality of the abdomen or pelvis.
2. Severe bilateral multifocal pulmonary disease, worsened. This may
indicate infection.

## 2021-04-06 MED ORDER — BARIUM SULFATE 2.1 % PO SUSP
900.0000 mL | ORAL | Status: AC
  Administered 2021-04-06: 900 mL via ORAL

## 2021-04-06 MED ORDER — IBUPROFEN 200 MG PO TABS
400.0000 mg | ORAL_TABLET | Freq: Four times a day (QID) | ORAL | Status: DC | PRN
  Administered 2021-04-06: 200 mg via ORAL
  Filled 2021-04-06: qty 2

## 2021-04-06 MED ORDER — SODIUM CHLORIDE 0.9 % IV BOLUS
500.0000 mL | Freq: Once | INTRAVENOUS | Status: AC
  Administered 2021-04-06: 500 mL via INTRAVENOUS

## 2021-04-06 NOTE — Progress Notes (Signed)
PROGRESS NOTE    Marie Church  SFK:[id number] DOB: [birth year] DOA: 03/30/2021 PCP: System, Provider Not In  Chief Complaint  Patient presents with   Pneumonia    Brief Narrative:  65 year old lady with prior history of malignant right breast cancer with axillary node involvement on active Kadcyla and radiation treatment, chronic systolic heart failure with improvement of left ventricular ejection fraction from 25% to 50%, type 2 diabetes mellitus, hypertension, anemia, thrombocytopenia presents to ED on 03/30/2021 for shortness of breath.  CT angiogram of the chest shows diffuse bilateral groundglass opacities consistent with pulmonary edema versus infectious process.  Patient was probably started on broad-spectrum IV antibiotics and referred to [facility] for admission for acute respiratory failure with hypoxia secondary to pneumonia in the setting of failed outpatient antibiotic treatment.  Her last CXR from 11/16 shows worsening aeration, then we changed antibiotics to vancomycin and cefepime . She remains on 2 lit of [place] oxygen . But continues to have fevers.   Assessment & Plan:   Principal Problem:   Sepsis due to pneumonia [facility]) Active Problems:   Anemia   Malignant neoplasm of upper-outer quadrant of right breast in female, estrogen receptor negative (HCC)   HFrEF (heart failure with reduced ejection fraction) (HCC)   Hyponatremia   Thrombocytopenia (HCC)   Acute respiratory failure (HCC)   Acute respiratory failure with hypoxia, sepsis secondary to bilateral pneumonia present on admission. Differentials include radiation pneumonitis vs metastatic disease.  -in the setting of failed outpatient treatment. -Patient continues to require nasal cannula oxygen to keep sats greater than 90%. She is currently on 2 lit of [place] oxygen.  -CT angiogram of the chest showed diffuse ground glass opacities doe on 03/30/21 -COVID-19 PCR, influenza PCR is negative, urine for strep pneumonia is  negative,. -Patient febrile on 11/15 night, repeat cultures ordered and repeat chest x-ray ordered.Re peat CXR shows worsening aeration of the lung . Interstitial and patchy airspace opacities have increased most evident in the right mid to lower lung. Changed the antibiotics to vancomycin and cefepime. MRSA is negative. Continue with cefepime.  she continues to have low grade fevers, despite escalation of antibiotics. Her last CXR from 11/16 shows worsening aeration, then we changed antibiotics to vancomycin and cefepime . She remains on 2 lit of  oxygen but continues to have some fevers.  Repeat CXR doe snot show any worsening pneumonia, blood cultures done and pending, UA is negative for infection ABG shows PH of 7.3 , and procalcitonin level is 0.18. wbc count remains wnl.  Therapy evaluations recommending home health PT.    Hyponatremia Suspect hypovolemic hyponatremia in the setting of dehydration. Continue to monitor.   Chronic systolic heart failure/history of nonischemic cardiomyopathy. Echocardiogram on 03/03/2021 showed improvement in left ventricular ejection fraction to 50% compared to the previous echo showing left ventricular ejection fraction at 20 to 25%. Continue with Coreg at 12.5 mg twice daily, Entresto 24-26 mg twice daily. Holding Lasix and spironolactone for now.      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 She has one more week of radiation treatment.     Abdominal pain Improved.  abdominal x-ray does not show any bowel obstruction.    Pulmonary nodules Recommend outpatient follow-up to exclude metastatic disease.   Anemia/thrombocytopenia Probably secondary to chemotherapy Continue to monitor counts. Hemoglobin stable around  8  and platelet counts are improving.  Poor appetite and nausea:  Add marinol.  Nausea is better.     DVT prophylaxis: (Lovenox) Code Status: (Full code) Family Communication: discussed with family at bedside.  Disposition:   Status is: Inpatient  Remains inpatient appropriate because: IV antibiotics.        Consultants:  None  Procedures: None  Antimicrobials:  Antibiotics Given (last 72 hours)     Date/Time Action Medication Dose Rate   04/03/21 2000 New Bag/Given   ceFEPIme (MAXIPIME) 2 g in sodium chloride 0.9 % 100 mL IVPB 2 g 200 mL/hr   04/03/21 2127 New Bag/Given   vancomycin (VANCOREADY) IVPB 1750 mg/350 mL 1,750 mg 175 mL/hr   04/04/21 0311 New Bag/Given   ceFEPIme (MAXIPIME) 2 g in sodium chloride 0.9 % 100 mL IVPB 2 g 200 mL/hr   04/04/21 1237 New Bag/Given   ceFEPIme (MAXIPIME) 2 g in sodium chloride 0.9 % 100 mL IVPB 2 g 200 mL/hr   04/04/21 2025 New Bag/Given   ceFEPIme (MAXIPIME) 2 g in sodium chloride 0.9 % 100 mL IVPB 2 g 200 mL/hr   04/05/21 0432 New Bag/Given   ceFEPIme (MAXIPIME) 2 g in sodium chloride 0.9 % 100 mL IVPB 2 g 200 mL/hr   04/05/21 1230 New Bag/Given   ceFEPIme (MAXIPIME) 2 g in sodium chloride 0.9 % 100 mL IVPB 2 g 200 mL/hr   04/05/21 2100 New Bag/Given   ceFEPIme (MAXIPIME) 2 g in sodium chloride 0.9 % 100 mL IVPB 2 g 200 mL/hr   04/06/21 0310 New Bag/Given   ceFEPIme (MAXIPIME) 2 g in sodium chloride 0.9 % 100 mL IVPB 2 g 200 mL/hr   04/06/21 1140 New Bag/Given   ceFEPIme (MAXIPIME) 2 g in sodium chloride 0.9 % 100 mL IVPB 2 g 200 mL/hr         Subjective: Sleepy , reports did not sleep last night. Had melatonin earlier this am, and has been catching up on her sleep.    Objective: Vitals:   04/05/21 2300 04/06/21 0605 04/06/21 1331 04/06/21 1501  BP: 135/74 (!) 110/91 (!) 144/61 116/64  Pulse: 88 66 99 96  Resp: 19 (!) 23 18 16   Temp: 98.7 F (37.1 C) 99.9 F (37.7 C) (!) 103 F (39.4 C) (!) 101.2 F (38.4 C)   TempSrc: Oral Oral Oral Oral  SpO2: 100% 93% 94% 95%  Weight:      Height:        Intake/Output Summary (Last 24 hours) at 04/06/2021 1757 Last data filed at 04/06/2021 1226 Gross per 24 hour  Intake 440 ml  Output 200 ml  Net 240 ml    Filed Weights   04/02/21 0607 04/03/21 0500 04/04/21 0500  Weight: 76.8 kg 79.1 kg 76.7 kg    Examination:  General exam: Appears calm and comfortable  Respiratory system: Clear to auscultation. Respiratory effort normal. Cardiovascular system: S1 & S2 heard, RRR. No JVD, . No pedal edema. Gastrointestinal system: Abdomen is nondistended, soft and nontender. Normal bowel sounds heard. Central nervous system: Alert and oriented. No focal neurological deficits. Extremities: Symmetric 5 x 5 power. Skin: No rashes, lesions or ulcers Psychiatry: Mood & affect appropriate.        Data Reviewed: I have personally reviewed following labs and imaging studies  CBC: Recent Labs  Lab 04/02/21 0527 04/03/21 0502 04/04/21 0359 04/05/21 0459 04/06/21 0009  WBC 7.2 6.1 6.1 4.5 4.3  HGB 9.0* 8.8* 8.9* 8.7* 8.2*  HCT 29.5* 28.7* 29.5* 28.6* 27.3*  MCV 85.5 86.4 86.5 87.2 86.1  PLT 118* 118* 126* 120* 114*     Basic Metabolic Panel: Recent Labs  Lab 04/01/21 0307 04/02/21 0527 04/04/21 0359 04/05/21 0459 04/06/21 0009  NA 133* 132* 132* 135 131*  K 3.5 4.3 3.9 3.9 4.0  CL 99 99 99 100 95*  CO2 29 28 31  32 33*  GLUCOSE 106* 102* 110* 102* 111*  BUN 7* 8 7* 9 7*  CREATININE 0.43* 0.55 0.52 0.51 0.49  CALCIUM 8.0* 8.5* 8.0* 8.4* 8.1*  MG 2.0 2.1  --   --   --      GFR: Estimated Creatinine Clearance: 75.4 mL/min (by C-G formula based on SCr of 0.49 mg/dL).  Liver Function Tests: No results for input(s): AST, ALT, ALKPHOS, BILITOT, PROT, ALBUMIN in the last 168 hours.   CBG: Recent Labs  Lab 03/30/21 2158 04/01/21 0734 04/01/21 1143  GLUCAP 87 117* 127*      Recent Results (from the past 240 hour(s))  Blood  culture (routine x 2)     Status: None   Collection Time: 03/28/21  5:32 PM   Specimen: BLOOD  Result Value Ref Range Status   Specimen Description   Final    BLOOD LEFT ANTECUBITAL Performed at [facility] [address], [place], [place] [address]    Special Requests   Final    BOTTLES DRAWN AEROBIC AND ANAEROBIC Blood Culture results may not be optimal due to an inadequate volume of blood received in culture bottles Performed at [facility] [address], [place], [place] [address]    Culture   Final    NO GROWTH 5 DAYS Performed at [facility], [place] [address] [place], [place] [address]    Report Status 04/02/2021 FINAL  Final  Resp Panel by RT-PCR (Flu A&B, Covid) Nasopharyngeal Swab     Status: None   Collection Time: 03/28/21  5:32 PM   Specimen: Nasopharyngeal Swab; Nasopharyngeal(NP) swabs in vial transport medium  Result Value Ref Range Status   SARS Coronavirus 2 by RT PCR NEGATIVE NEGATIVE Final    Comment: (NOTE) SARS-CoV-2 target nucleic acids are NOT DETECTED.  The SARS-CoV-2 RNA is generally detectable in upper respiratory specimens during the acute phase of infection. The lowest concentration of SARS-CoV-2 viral copies this assay can detect is 138 copies/mL. A negative result does not preclude SARS-Cov-2 infection and should not be used as the sole basis for treatment or other patient management decisions. A negative result may occur with  improper specimen collection/handling, submission of specimen other than nasopharyngeal swab, presence of viral mutation(s) within the areas targeted by this assay, and inadequate number of viral copies(<138 copies/mL). A negative result must be combined with clinical observations, patient history, and epidemiological information. The expected result is Negative.  Fact Sheet for Patients:  EntrepreneurPulse.com.au  Fact Sheet for Healthcare Providers:   IncredibleEmployment.be  This test is no t yet approved or cleared by the [place] FDA and  has been authorized for detection and/or diagnosis of SARS-CoV-2 by FDA under an Emergency Use Authorization (EUA). This EUA will remain  in effect (meaning this test can be used) for the duration of the COVID-19 declaration under Section 564(b)(1) of the Act, 21 U.S.C.section 360bbb-3(b)(1), unless the authorization is terminated  or revoked sooner.       Influenza A by PCR NEGATIVE NEGATIVE Final   Influenza B by PCR NEGATIVE NEGATIVE  Final    Comment: (NOTE) The Xpert Xpress SARS-CoV-2/FLU/RSV plus assay is intended as an aid in the diagnosis of influenza from Nasopharyngeal swab specimens and should not be used as a sole basis for treatment. Nasal washings and aspirates are unacceptable for Xpert Xpress SARS-CoV-2/FLU/RSV testing.  Fact Sheet for Patients: EntrepreneurPulse.com.au  Fact Sheet for Healthcare Providers: IncredibleEmployment.be  This test is not yet approved or cleared by the [place] FDA and has been authorized for detection and/or diagnosis of SARS-CoV-2 by FDA under an Emergency Use Authorization (EUA). This EUA will remain in effect (meaning this test can be used) for the duration of the COVID-19 declaration under Section 564(b)(1) of the Act, 21 U.S.C. section 360bbb-3(b)(1), unless the authorization is terminated or revoked.  Performed at [facility], [place] [address] [place], [place] [address]   Blood culture (routine x 2)     Status: None   Collection Time: 03/28/21  5:37 PM   Specimen: BLOOD  Result Value Ref Range Status   Specimen Description   Final    BLOOD BLOOD LEFT WRIST Performed at [facility] [address], [place], [place] [address]    Special Requests   Final    BOTTLES DRAWN AEROBIC AND ANAEROBIC Blood Culture results may not be optimal  due to an inadequate volume of blood received in culture bottles Performed at [facility] [address], [place], [place] [address]    Culture   Final    NO GROWTH 5 DAYS Performed at [facility], [place] [address] [place], [place] [address]    Report Status 04/02/2021 FINAL  Final  Resp Panel by RT-PCR (Flu A&B, Covid) Nasopharyngeal Swab     Status: None   Collection Time: 03/30/21  2:39 PM   Specimen: Nasopharyngeal Swab; Nasopharyngeal(NP) swabs in vial transport medium  Result Value Ref Range Status   SARS Coronavirus 2 by RT PCR NEGATIVE NEGATIVE Final    Comment: (NOTE) SARS-CoV-2 target nucleic acids are NOT DETECTED.  The SARS-CoV-2 RNA is generally detectable in upper respiratory specimens during the acute phase of infection. The lowest concentration of SARS-CoV-2 viral copies this assay can detect is 138 copies/mL. A negative result does not preclude SARS-Cov-2 infection and should not be used as the sole basis for treatment or other patient management decisions. A negative result may occur with  improper specimen collection/handling, submission of specimen other than nasopharyngeal swab, presence of viral mutation(s) within the areas targeted by this assay, and inadequate number of viral copies(<138 copies/mL). A negative result must be combined with clinical observations, patient history, and epidemiological information. The expected result is Negative.  Fact Sheet for Patients:  EntrepreneurPulse.com.au  Fact Sheet for Healthcare Providers:  IncredibleEmployment.be  This test is no t yet approved or cleared by the [place] FDA and  has been authorized for detection and/or diagnosis of SARS-CoV-2 by FDA under an Emergency Use Authorization (EUA). This EUA will remain  in effect (meaning this test can be used) for the duration of the COVID-19 declaration under Section 564(b)(1) of the Act,  21 U.S.C.section 360bbb-3(b)(1), unless the authorization is terminated  or revoked sooner.       Influenza A by PCR NEGATIVE NEGATIVE Final   Influenza B by PCR NEGATIVE NEGATIVE Final    Comment: (NOTE) The Xpert Xpress SARS-CoV-2/FLU/RSV plus assay is intended as an aid in the diagnosis of influenza from Nasopharyngeal swab specimens and should not be used as a  sole basis for treatment. Nasal washings and aspirates are unacceptable for Xpert Xpress SARS-CoV-2/FLU/RSV testing.  Fact Sheet for Patients: EntrepreneurPulse.com.au  Fact Sheet for Healthcare Providers: IncredibleEmployment.be  This test is not yet approved or cleared by the [place] FDA and has been authorized for detection and/or diagnosis of SARS-CoV-2 by FDA under an Emergency Use Authorization (EUA). This EUA will remain in effect (meaning this test can be used) for the duration of the COVID-19 declaration under Section 564(b)(1) of the Act, 21 U.S.C. section 360bbb-3(b)(1), unless the authorization is terminated or revoked.  Performed at [facility], [place] [address] [place], [place] [address]   Culture, blood (Routine x 2)     Status: None   Collection Time: 03/30/21  3:20 PM   Specimen: BLOOD  Result Value Ref Range Status   Specimen Description   Final    BLOOD PORTA CATH Performed at [facility] [address] [place], [place] [address]    Special Requests   Final    BOTTLES DRAWN AEROBIC AND ANAEROBIC Blood Culture adequate volume Performed at [facility] [address], [place], [place] [address]    Culture   Final    NO GROWTH 5 DAYS Performed at [facility], [place] [address], [place], [place] [address]    Report Status 04/04/2021 FINAL  Final  Culture, blood (Routine x 2)     Status: None   Collection Time: 03/30/21  3:27 PM   Specimen: BLOOD  Result Value Ref Range Status    Specimen Description   Final    BLOOD LEFT ANTECUBITAL Performed at [facility] [address] [place], [place] [address]    Special Requests   Final    BOTTLES DRAWN AEROBIC AND ANAEROBIC Blood Culture results may not be optimal due to an excessive volume of blood received in culture bottles Performed at [facility] [address], [place], [place] [address]    Culture   Final    NO GROWTH 5 DAYS Performed at [facility], [place] [address], [place], [place] [address]    Report Status 04/04/2021 FINAL  Final  Culture, blood (Routine X 2) w Reflex to ID Panel     Status: None (Preliminary result)   Collection Time: 04/02/21  6:26 PM   Specimen: BLOOD  Result Value Ref Range Status   Specimen Description   Final    BLOOD BLOOD LEFT HAND Performed at [facility] [address] [place], [place] [address]    Special Requests   Final    BOTTLES DRAWN AEROBIC ONLY Blood Culture adequate volume Performed at [facility] [address], [place], [place] [address]    Culture   Final    NO GROWTH 3 DAYS Performed at [facility], [place] [address] [place],  [address]    Report Status PENDING  Incomplete  Culture, blood (Routine X 2) w Reflex to ID Panel     Status: None (Preliminary result)   Collection Time: 04/02/21  7:43 PM   Specimen: BLOOD LEFT WRIST  Result Value Ref Range Status   Specimen Description   Final    BLOOD LEFT WRIST Performed at [facility] [address] [place],  [address]    Special Requests   Final    BOTTLES DRAWN AEROBIC ONLY Blood Culture adequate volume Performed at [facility]  [address] [place], [place] [address]    Culture   Final    NO GROWTH 3 DAYS Performed at [facility], [place] [address], [place], [place] [address]    Report Status PENDING  Incomplete  MRSA Next Gen by PCR, Nasal      Status: None   Collection Time: 04/03/21 11:26 AM   Specimen: Nasal Mucosa; Nasal Swab  Result Value Ref Range Status   MRSA by PCR Next Gen NOT DETECTED NOT DETECTED Final    Comment: (NOTE) The GeneXpert MRSA Assay (FDA approved for NASAL specimens only), is one component of a comprehensive MRSA colonization surveillance program. It is not intended to diagnose MRSA infection nor to guide or monitor treatment for MRSA infections. Test performance is not FDA approved in patients less than 81 years old. Performed at [facility], [place] [address] [place], [place] [address]   Expectorated Sputum Assessment w Gram Stain, Rflx to Resp Cult     Status: None   Collection Time: 04/06/21 10:00 AM   Specimen: Expectorated Sputum  Result Value Ref Range Status   Specimen Description EXPECTORATED SPUTUM  Final   Special Requests NONE  Final   Sputum evaluation   Final    THIS SPECIMEN IS ACCEPTABLE FOR SPUTUM CULTURE Performed at [name], [place] [address] [place], [place] [address]    Report Status 04/06/2021 FINAL  Final          Radiology Studies: DG Chest 2 View  Result Date: 04/05/2021 CLINICAL DATA:  Fever EXAM: CHEST - 2 VIEW COMPARISON:  04/03/2019 FINDINGS: Lung volumes are small, however, pulmonary insufflation appears stable since prior examination. Superimposed diffuse interstitial pulmonary infiltrate, atypical infection versus edema, appears stable since prior examination. No pneumothorax or pleural effusion. Cardiac size within normal limits. Left internal jugular chest port tip noted within the superior vena cava. No acute bone abnormality. IMPRESSION: Pulmonary hypoinflation, unchanged. Stable diffuse interstitial pulmonary infiltrate, atypical infection versus edema. Electronically Signed   By: Fidela Salisbury M.D.   On: 04/05/2021 20:46        Scheduled Meds:  carvedilol  12.5 mg Oral BID WC   Chlorhexidine Gluconate Cloth  6  each Topical Daily   dronabinol  5 mg Oral BID AC   enoxaparin (LOVENOX) injection  40 mg Subcutaneous Q24H   lactose free nutrition  237 mL Oral TID WC   mouth rinse  15 mL Mouth Rinse BID   melatonin  3 mg Oral QHS   multivitamin with minerals  1 tablet Oral Daily   sacubitril-valsartan  1 tablet Oral BID   sodium chloride flush  3 mL Intravenous Q12H   Continuous Infusions:  ceFEPime (MAXIPIME) IV 2 g (04/06/21 1140)     LOS: 6 days        Hosie Poisson, MD [facility]   To contact the attending provider between 7A-7P or the covering provider during after hours 7P-7A, please log into the web site [email or web address] and access using universal [place] password for that web site. If you do not have the password, please call the hospital operator.  04/06/2021, 5:57 PM

## 2021-04-06 NOTE — Progress Notes (Signed)
Pharmacy Antibiotic Note  Marie Church is a 65 y.o. female admitted on 03/30/2021 with pneumonia. Due to continued low-grade temps and worsening CXR overnight despite Rocephin/Zithromax, Pharmacy was consulted for cefepime dosing.  Plan: Continue cefepime 2gIV q8 hours With stable renal function, dose adjustments for cefepime unlikely.   Pharmacy will formally sign off of consult and follow patient peripherally. Please re-consult if needed   Thank you for allowing pharmacy to be a part of this patient's care.  Dimple Nanas, PharmD 04/06/2021 9:39 AM

## 2021-04-06 NOTE — Progress Notes (Signed)
   04/06/21 1813  Vitals  Temp (!) 102.2 F (39 C)  Temp Source Oral  BP (!) 123/93  MAP (mmHg) 104  BP Location Left Arm  BP Method Automatic  Patient Position (if appropriate) Sitting  Pulse Rate (!) 103  Pulse Rate Source Monitor  MEWS COLOR  MEWS Score Color Yellow  Oxygen Therapy  SpO2 98 %  O2 Device Nasal Cannula  MEWS Score  MEWS Temp 2  MEWS Systolic 0  MEWS Pulse 1  MEWS RR 0  MEWS LOC 0  MEWS Score 3  Provider Notification  Provider Ansel Bong, MD  Date Provider Notified 04/06/21  Time Provider Notified [id number]  Notification Type Page  Notification Reason Change in status  Provider response See new orders  Date of Provider Response 04/06/21  Time of Provider Response 1815    Dr Karleen [place] notified of elevated fever despite tylenol. New orders placed for ibupofen. Also discussed with her that the patient is very lethargic and only responding to touch/pain. New orders for ABG.

## 2021-04-07 ENCOUNTER — Ambulatory Visit: Payer: Self-pay

## 2021-04-07 ENCOUNTER — Inpatient Hospital Stay (HOSPITAL_COMMUNITY): Payer: Self-pay

## 2021-04-07 DIAGNOSIS — A419 Sepsis, unspecified organism: Principal | ICD-10-CM

## 2021-04-07 DIAGNOSIS — J189 Pneumonia, unspecified organism: Secondary | ICD-10-CM

## 2021-04-07 DIAGNOSIS — E440 Moderate protein-calorie malnutrition: Secondary | ICD-10-CM | POA: Insufficient documentation

## 2021-04-07 DIAGNOSIS — R4182 Altered mental status, unspecified: Secondary | ICD-10-CM

## 2021-04-07 DIAGNOSIS — R609 Edema, unspecified: Secondary | ICD-10-CM

## 2021-04-07 DIAGNOSIS — J9602 Acute respiratory failure with hypercapnia: Secondary | ICD-10-CM

## 2021-04-07 LAB — CREATININE, SERUM
Creatinine, Ser: 0.76 mg/dL (ref 0.44–1.00)
GFR, Estimated: >60 mL/min (ref >60)

## 2021-04-07 LAB — CBC WITH DIFFERENTIAL/PLATELET
Abs Immature Granulocytes: 0.06 10*3/uL (ref 0.00–0.07)
Basophils Absolute: 0 10*3/uL (ref 0.0–0.1)
Basophils Relative: 1 %
Eosinophils Absolute: 0.2 10*3/uL (ref 0.0–0.5)
Eosinophils Relative: 5 %
HCT: 29.4 % — ABNORMAL LOW (ref 36.0–46.0)
Hemoglobin: 8.7 g/dL — ABNORMAL LOW (ref 12.0–15.0)
Immature Granulocytes: 1 %
Lymphocytes Relative: 35 %
Lymphs Abs: 1.5 10*3/uL (ref 0.7–4.0)
MCH: 26.1 pg (ref 26.0–34.0)
MCHC: 29.6 g/dL — ABNORMAL LOW (ref 30.0–36.0)
MCV: 88.3 fL (ref 80.0–100.0)
Monocytes Absolute: 0.6 10*3/uL (ref 0.1–1.0)
Monocytes Relative: 14 %
Neutro Abs: 1.9 10*3/uL (ref 1.7–7.7)
Neutrophils Relative %: 44 %
Platelets: 88 10*3/uL — ABNORMAL LOW (ref 150–400)
RBC: 3.33 MIL/uL — ABNORMAL LOW (ref 3.87–5.11)
RDW: 16.8 % — ABNORMAL HIGH (ref 11.5–15.5)
WBC: 4.2 10*3/uL (ref 4.0–10.5)
nRBC: 0 % (ref 0.0–0.2)

## 2021-04-07 LAB — CSF CELL COUNT WITH DIFFERENTIAL
RBC Count, CSF: 1 /mm3 — ABNORMAL HIGH
RBC Count, CSF: 9 /mm3 — ABNORMAL HIGH
Tube #: 1
Tube #: 4
WBC, CSF: 2 /mm3 (ref 0–5)
WBC, CSF: 2 /mm3 (ref 0–5)

## 2021-04-07 LAB — BLOOD GAS, ARTERIAL
Acid-Base Excess: 7.9 mmol/L — ABNORMAL HIGH (ref 0.0–2.0)
Bicarbonate: 34.3 mmol/L — ABNORMAL HIGH (ref 20.0–28.0)
FIO2: 30
O2 Saturation: 97.9 %
Patient temperature: 97.5
pCO2 arterial: 63.2 mmHg — ABNORMAL HIGH (ref 32.0–48.0)
pH, Arterial: 7.35 (ref 7.350–7.450)
pO2, Arterial: 93.6 mmHg (ref 83.0–108.0)

## 2021-04-07 LAB — RESPIRATORY PANEL BY PCR

## 2021-04-07 LAB — CULTURE, BLOOD (ROUTINE X 2)
Culture: NO GROWTH
Culture: NO GROWTH
Special Requests: ADEQUATE
Special Requests: ADEQUATE

## 2021-04-07 LAB — COMPREHENSIVE METABOLIC PANEL
ALT: 20 U/L (ref 0–44)
AST: 52 U/L — ABNORMAL HIGH (ref 15–41)
Albumin: 2.2 g/dL — ABNORMAL LOW (ref 3.5–5.0)
Alkaline Phosphatase: 73 U/L (ref 38–126)
Anion gap: 3 — ABNORMAL LOW (ref 5–15)
BUN: 9 mg/dL (ref 8–23)
CO2: 32 mmol/L (ref 22–32)
Calcium: 8 mg/dL — ABNORMAL LOW (ref 8.9–10.3)
Chloride: 96 mmol/L — ABNORMAL LOW (ref 98–111)
Creatinine, Ser: 0.63 mg/dL (ref 0.44–1.00)
GFR, Estimated: >60 mL/min (ref >60)
Glucose, Bld: 93 mg/dL (ref 70–99)
Potassium: 4.5 mmol/L (ref 3.5–5.1)
Sodium: 131 mmol/L — ABNORMAL LOW (ref 135–145)
Total Bilirubin: 0.5 mg/dL (ref 0.3–1.2)
Total Protein: 8.2 g/dL — ABNORMAL HIGH (ref 6.5–8.1)

## 2021-04-07 LAB — GLUCOSE, CSF: Glucose, CSF: 53 mg/dL (ref 40–70)

## 2021-04-07 LAB — PROTEIN, CSF: Total  Protein, CSF: 28 mg/dL (ref 15–45)

## 2021-04-07 IMAGING — RF DG FLUORO GUIDE SPINAL/SI JT INJ*L*
9 series · 14 of 24 positions shown · non-contrast
Comparison: MRI of the brain and abdominopelvic CT [DATE].

CLINICAL DATA: Altered mental status. Clinical need for CSF
analysis.

EXAM:
DIAGNOSTIC LUMBAR PUNCTURE UNDER FLUOROSCOPIC GUIDANCE

[Series 1: cp_injection proc · 1 of 3 frames shown (1 of 9)]
[frame 1/3]
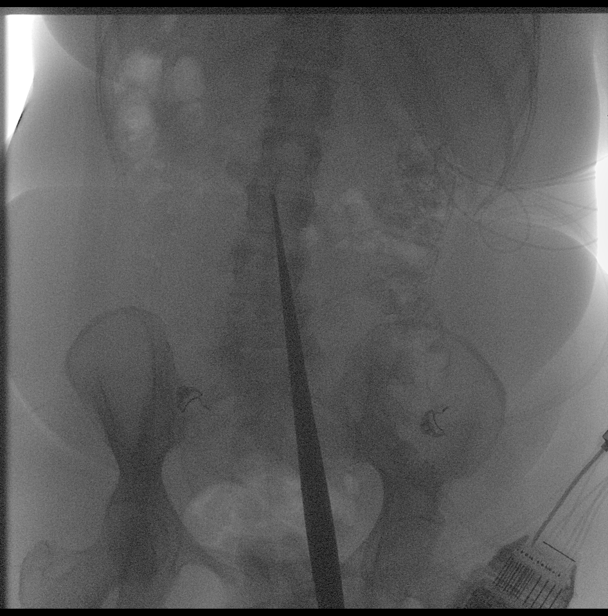

[Series 2: cp_injection proc · 2 of 3 frames shown (2 of 9)]
[frame 1/3]
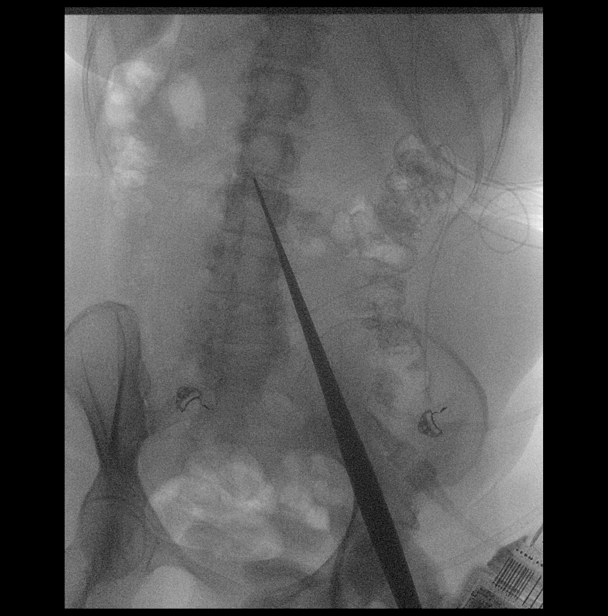
[frame 3/3]
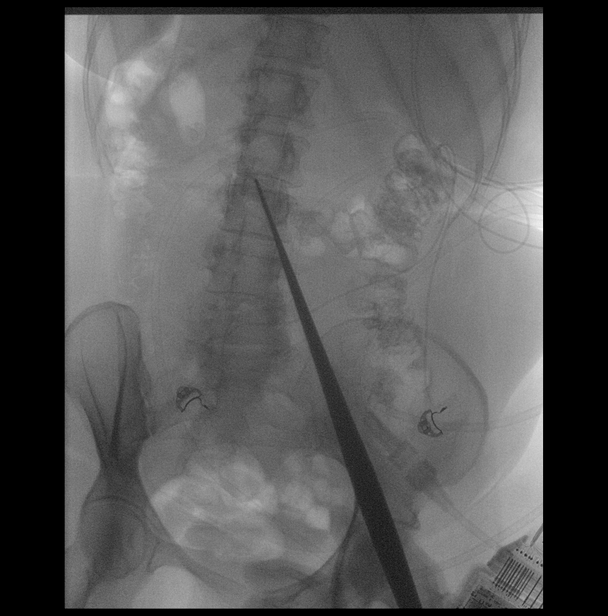

[Series 3: cp_injection proc · 2 of 14 frames shown (3 of 9)]
[frame 8/14]
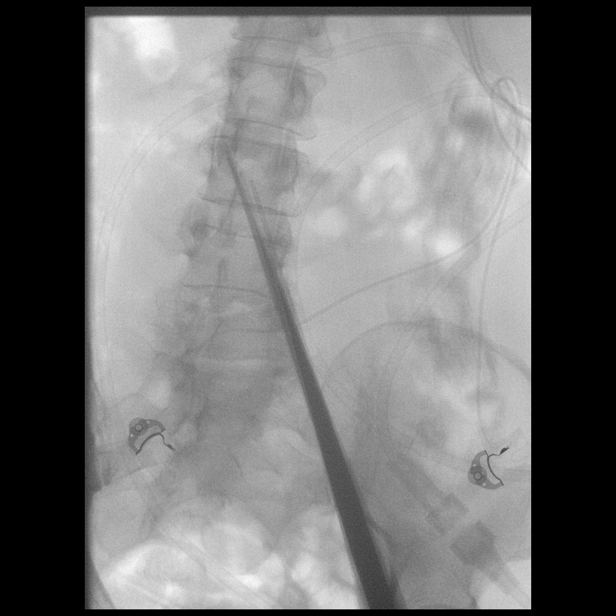
[frame 12/14]
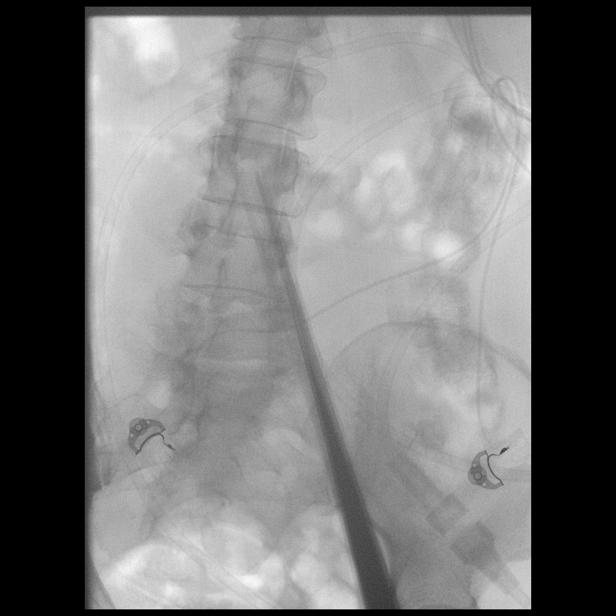

[Series 4: cp_injection proc · 1 of 7 frames shown (4 of 9)]
[frame 4/7]
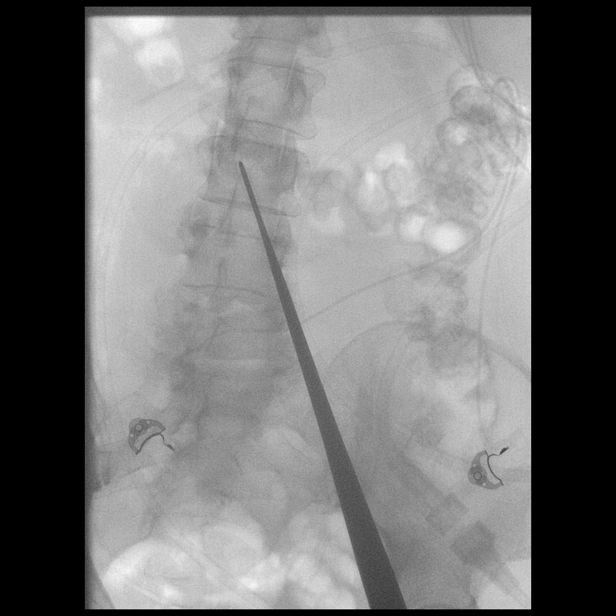

[Series 5: cp_injection proc · 2 of 4 frames shown (5 of 9)]
[frame 1/4]
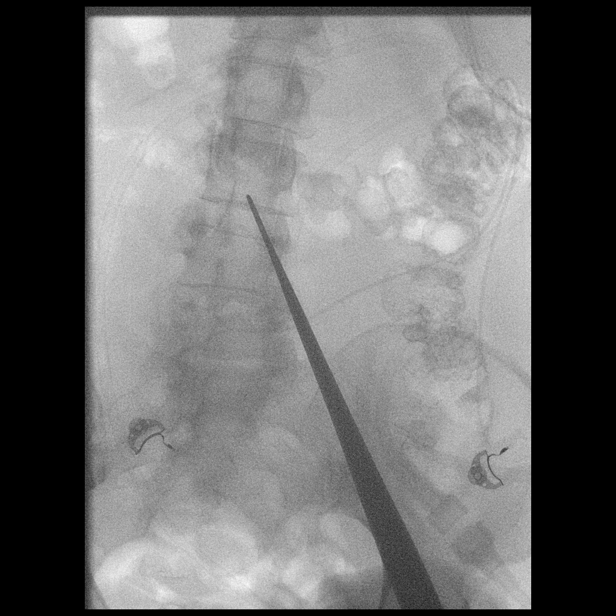
[frame 3/4]
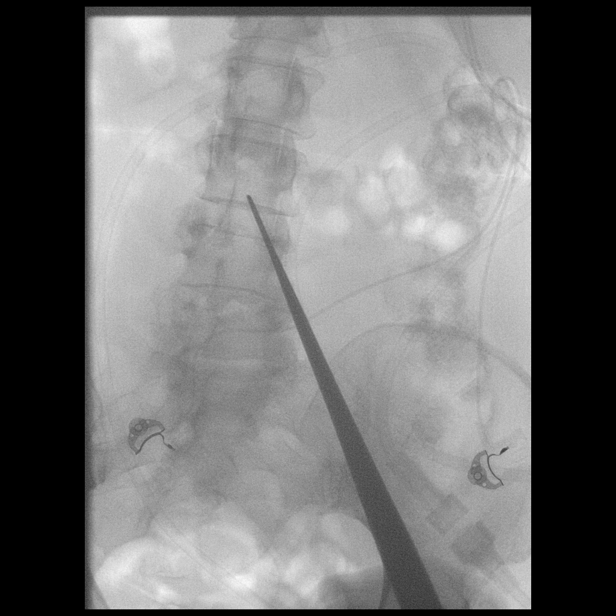

[Series 6: cp_injection proc · 1 of 3 frames shown (6 of 9)]
[frame 2/3]
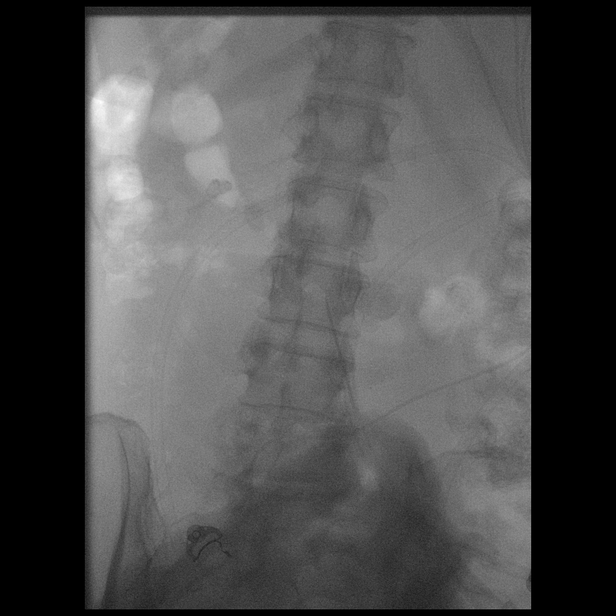

[Series 7: cp_injection proc · 1 of 5 frames shown (7 of 9)]
[frame 1/5]
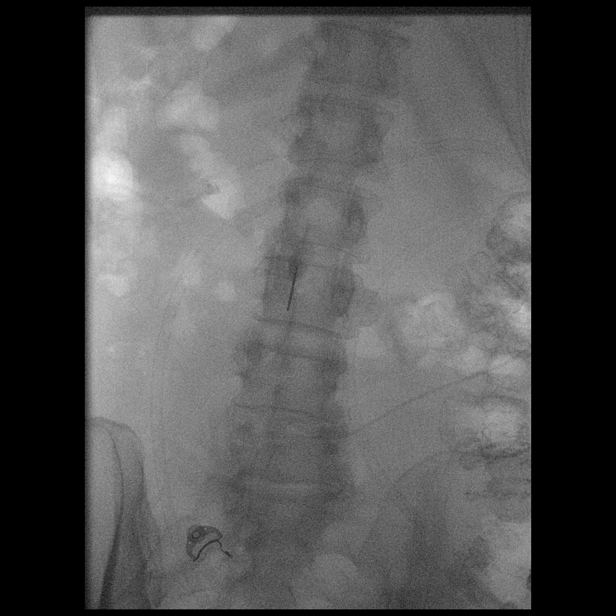

[Series 8: cp_injection proc · 3 of 7 frames shown (8 of 9)]
[frame 2/7]
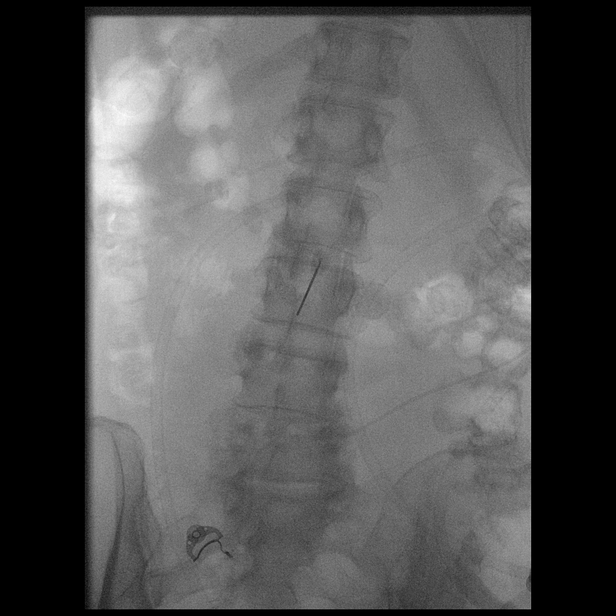
[frame 4/7]
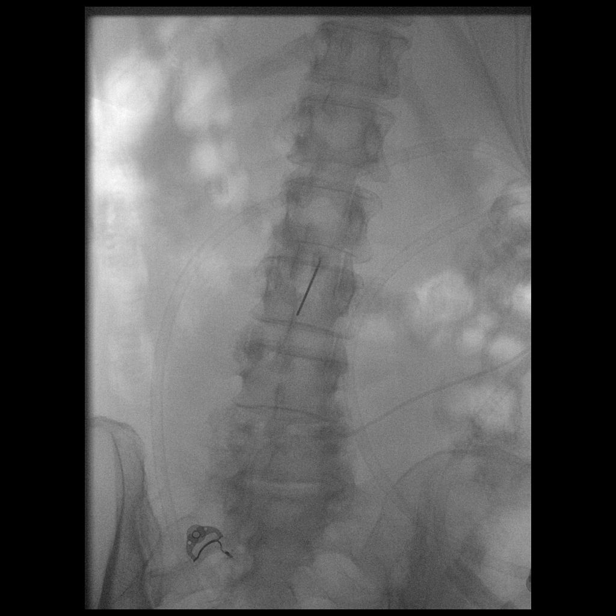
[frame 7/7]
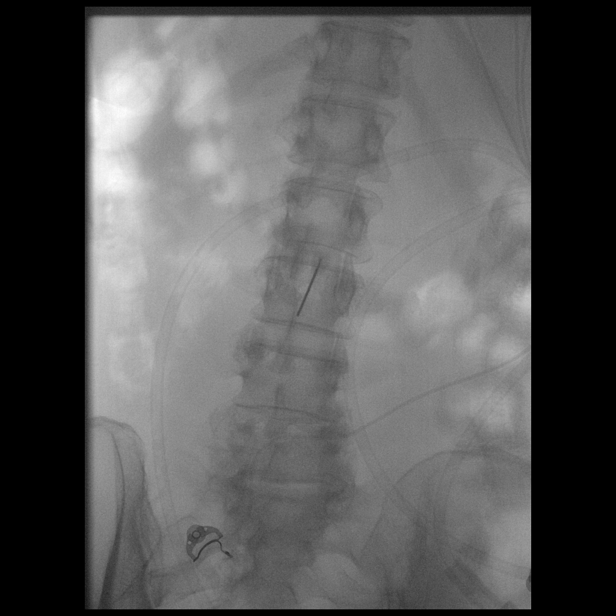

[Series 9: cp_injection proc · 1 of 6 frames shown (9 of 9)]
[frame 6/6]
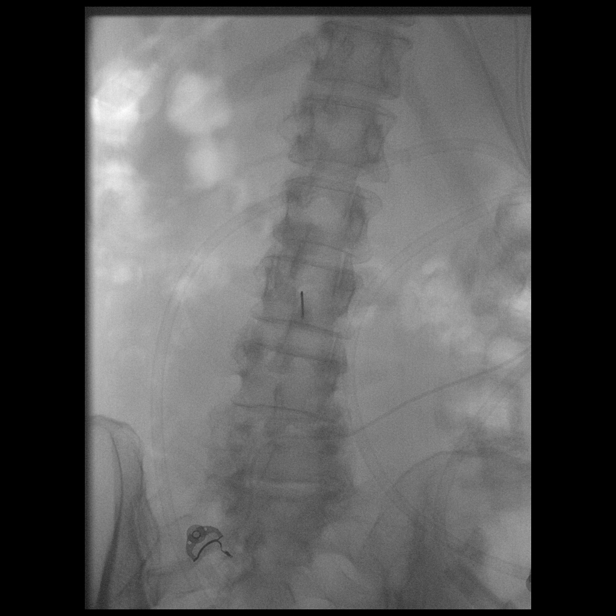

[14 of 24 positions shown; findings below may reference images not displayed]

FLUOROSCOPY TIME:  Fluoroscopy Time: 12 seconds of low-dose pulsed
fluoroscopy

Radiation Exposure Index (if provided by the fluoroscopic device):
0.9 mGy

Number of Acquired Spot Images: 0

PROCEDURE:
Standard time-out was employed. I discussed the risks (including
hemorrhage, infection, headache, and nerve damage, among others),
benefits, and alternatives to fluoroscopically guided lumbar
puncture with the patient's daughter. We specifically discussed the
high technical likelihood of success of the procedure. The patient's
daughter understood and elected to undergo the procedure.

An appropriate site for lumbar puncture was marked on the patient's
skin under fluoroscopic guidance. Following sterile skin prep and
local anesthetic administration consisting of 1 percent lidocaine, a
22 gauge spinal needle was advanced without difficulty into the
thecal sac at the at the L2-3 level under fluoroscopic guidance.
Clear CSF was returned. Opening pressure was 14 cm of water measured
prone through the needle.

10.5 cc of clear CSF was collected. The needle was subsequently
removed and the skin cleansed and bandaged. No immediate
complications were observed.
IMPRESSION: Diagnostic lumbar puncture performed without complication.

## 2021-04-07 MED ORDER — VANCOMYCIN HCL 1000 MG/200ML IV SOLN
1000.0000 mg | Freq: Two times a day (BID) | INTRAVENOUS | Status: DC
Start: 2021-04-07 — End: 2021-04-08
  Administered 2021-04-07 – 2021-04-08 (×2): 1000 mg via INTRAVENOUS
  Filled 2021-04-07 (×2): qty 200

## 2021-04-07 MED ORDER — DEXTROSE 5 % IV SOLN
800.0000 mg | Freq: Three times a day (TID) | INTRAVENOUS | Status: DC
  Administered 2021-04-07 – 2021-04-08 (×2): 800 mg via INTRAVENOUS
  Filled 2021-04-07 (×5): qty 16

## 2021-04-07 MED ORDER — CEFTRIAXONE SODIUM 2 G IJ SOLR: 2.0000 g | INTRAMUSCULAR | Status: DC

## 2021-04-07 MED ORDER — SODIUM CHLORIDE 0.9 % IV SOLN
2.0000 g | Freq: Two times a day (BID) | INTRAVENOUS | Status: DC
  Filled 2021-04-07: qty 20

## 2021-04-07 MED ORDER — ENSURE MAX PROTEIN PO LIQD
11.0000 [oz_av] | Freq: Two times a day (BID) | ORAL | Status: DC
Start: 2021-04-07 — End: 2021-04-15
  Administered 2021-04-07 – 2021-04-12 (×9): 11 [oz_av] via ORAL
  Filled 2021-04-07 (×17): qty 330

## 2021-04-07 MED ORDER — ACYCLOVIR SODIUM 50 MG/ML IV SOLN
800.0000 mg | Freq: Three times a day (TID) | INTRAVENOUS | Status: DC
Start: 2021-04-07 — End: 2021-04-07
  Administered 2021-04-07: 800 mg via INTRAVENOUS
  Filled 2021-04-07 (×2): qty 16

## 2021-04-07 MED ORDER — HYDROXYZINE HCL 10 MG PO TABS
10.0000 mg | ORAL_TABLET | Freq: Three times a day (TID) | ORAL | Status: DC | PRN
  Administered 2021-04-07 – 2021-04-15 (×9): 10 mg via ORAL
  Filled 2021-04-07 (×9): qty 1

## 2021-04-07 MED ORDER — MEROPENEM 1 G IV SOLR
2.0000 g | Freq: Three times a day (TID) | INTRAVENOUS | Status: DC
  Administered 2021-04-07: 2 g via INTRAVENOUS
  Filled 2021-04-07 (×2): qty 2

## 2021-04-07 MED ORDER — SODIUM CHLORIDE 0.9 % IV SOLN
2.0000 g | Freq: Three times a day (TID) | INTRAVENOUS | Status: DC
  Administered 2021-04-07 – 2021-04-08 (×3): 2 g via INTRAVENOUS
  Filled 2021-04-07 (×6): qty 2

## 2021-04-07 NOTE — Plan of Care (Signed)
  Problem: Education: Goal: Knowledge of General Education information will improve Description: Including pain rating scale, medication(s)/side effects and non-pharmacologic comfort measures Outcome: Progressing   Problem: Clinical Measurements: Goal: Will remain free from infection Outcome: Progressing   

## 2021-04-07 NOTE — Progress Notes (Addendum)
2225 PT stated that she needed to go to the bathroom.  RN told pt ok to go ahead and sit up on side of the bed.  Pt was really slow to respond to commands and questions compared to initial assessment at 2015.   Pt sister assisted RN in getting pt laying back in bed.  PT was 1+ assist to/from [facility] but seemed unable to follow RN directions to lay back in the bed. Pt sister assisted RN in giving Tylenol and melatonin. Due to Pt mental status RN placed pt on BiPAP.  Pt tolerating well, already more alert and interactive as of the time of this note  2330 Pt requested to take the bipap off.  Mentation and alert and interactive.  Will continue to monitor need for bipap

## 2021-04-07 NOTE — Consult Note (Signed)
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 Her workup was initially remarkable for diffuse bilateral ggo and she was started on ceftriaxone and azithromycin for CAP coverage. She was persistently febrile and on 11/17 was changed to vancomycin, cefepime. Negative MRSA swab so vanc discontinued. She is on cefepime day 4 but continues to be febrile and CT A/P 11/20 shows likely progression of same process present on 11/13. She tells me she feels tired and that she's coughing up whitish sputum.  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 rate 17, height 5' 6.14" (1.68 m), weight 80.8 kg, SpO2 99 %.        Intake/Output Summary (Last 24 hours) at 04/07/2021 0843 Last data filed at 04/07/2021 0456 Gross per 24 hour  Intake 600 ml  Output 500 ml  Net 100 ml   Filed Weights   04/03/21 0500 04/04/21 0500 04/07/21 0500  Weight: 79.1 kg 76.7 kg 80.8 kg    Examination: General appearance: 65 y.o., female, drowsy but arousable Eyes: anicteric sclerae,trackign HENT: NCAT; dry MM Neck: Trachea midline; no lymphadenopathy, no  JVD Lungs: Diminished bl, with normal respiratory effort CV: RRR, no MRGs  Abdomen: Soft, non-tender; non-distended, BS present  Extremities: No peripheral edema, radial and DP pulses present bilaterally  Skin: Normal temperature, turgor and texture; no rash Psych: Appropriate affect Neuro: Alert and oriented to person and place, no focal deficit    Resolved Hospital Problem list     Assessment & Plan:   # Acute hypoxic and acute on chronic hypercapnic respiratory failure # Widespread ggo with almost nodular appearance centered on bronchovascular bundles # Persistent fever Multifocal pneumonia from resistant typical or atypical bacteria, organizing pneumonia/pneumonitis from kadcyla/radiation, lymphangitic spread of cancer are considerations.  - agree with LP given encephalopathy - f/u respiratory cx, AFB cx - urine legionella, strep neg, BCx NGTD - reasonable to switch to meropenem given lack of improvement, worsening infiltrate, simultaneous concern for possibility of CNS infection - CT Chest - tentative plan for bronch/BAL/TBLB with EBUS if LAD present on CT Chest on Wednesday 11/23 at 1pm.   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 - on active treatment with kadcyla and radiation   Best Practice (right click and "Reselect all SmartList Selections" daily)   Per [facility]  Labs   CBC: Recent Labs  Lab 04/02/21 0527 04/03/21 0502 04/04/21 0359 04/05/21 0459 04/06/21 0009  WBC 7.2 6.1 6.1 4.5 4.3  HGB 9.0* 8.8* 8.9* 8.7* 8.2*  HCT  29.5* 28.7* 29.5* 28.6* 27.3*  MCV 85.5 86.4 86.5 87.2 86.1  PLT 118* 118* 126* 120* 114*    Basic Metabolic Panel: Recent Labs  Lab 04/01/21 0307 04/02/21 0527  04/04/21 0359 04/05/21 0459 04/06/21 0009 04/07/21 0100  NA 133* 132* 132* 135 131*  --   K 3.5 4.3 3.9 3.9 4.0  --   CL 99 99 99 100 95*  --   CO2 _0 32 33*  --   GLUCOSE 106* 102* 110* 102* 111*  --   BUN 7* 8 7* 9 7*  --   CREATININE 0.43* 0.55 0.52 0.51 0.49 0.76  CALCIUM 8.0* 8.5* 8.0* 8.4* 8.1*  --   MG 2.0 2.1  --   --   --   --    GFR: Estimated Creatinine Clearance: 77.4 mL/min (by C-G formula based on SCr of 0.76 mg/dL). Recent Labs  Lab 04/03/21 0502 04/04/21 0359 04/05/21 0459 04/05/21 1524 04/06/21 0009  PROCALCITON  --   --   --  0.18  --   WBC 6.1 6.1 4.5  --  4.3    Liver Function Tests: No results for input(s): AST, ALT, ALKPHOS, BILITOT, PROT, ALBUMIN in the last 168 hours. No results for input(s): LIPASE, AMYLASE in the last 168 hours. Recent Labs  Lab 04/05/21 1524  AMMONIA 24    ABG    Component Value Date/Time   PHART 7.350 04/07/2021 0410   PCO2ART 63.2 (H) 04/07/2021 0410   PO2ART 93.6 04/07/2021 0410   HCO3 34.3 (H) 04/07/2021 0410   TCO2 39 (H) 10/10/2020 0808   O2SAT 97.9 04/07/2021 0410     Coagulation Profile: No results for input(s): INR, PROTIME in the last 168 hours.  Cardiac Enzymes: No results for input(s): CKTOTAL, CKMB, CKMBINDEX, TROPONINI in the last 168 hours.  HbA1C: Hgb A1c MFr Bld  Date/Time Value Ref Range Status  10/04/2020 01:50 AM 6.4 (H) 4.8 - 5.6 % Final    Comment:    (NOTE) Pre diabetes:          5.7%-6.4%  Diabetes:              >6.4%  Glycemic control for   <7.0% adults with diabetes     CBG: Recent Labs  Lab 04/01/21 0734 04/01/21 1143  GLUCAP 117* 127*    Review of Systems:   Unreliable in setting of encephalopathy  Past Medical History:  She,  has a past medical history of Breast cancer ([place]), DM type 2 (diabetes mellitus, type 2) ([name]), and Hypertension.   Surgical History:   Past Surgical History:  Procedure Laterality Date   BREAST LUMPECTOMY Right 11/30/2020    RIGHT/LEFT HEART CATH AND CORONARY ANGIOGRAPHY N/A 10/10/2020   Procedure: RIGHT/LEFT HEART CATH AND CORONARY ANGIOGRAPHY;  Surgeon: Jolaine Artist, MD;  Location: [facility];  Service: Cardiovascular;  Laterality: N/A;     Social History:   reports that she has quit smoking. Her smoking use included cigarettes. She has never used smokeless tobacco. She reports that she does not drink alcohol and does not use drugs.   Family History:  Her family history is negative for Heart failure, Heart disease, and Heart attack.   Allergies Allergies  Allergen Reactions   Doxycycline Rash   Amoxicillin-Pot Clavulanate Rash    Tolerates Rocephin   Lisinopril Rash     Home Medications  Prior to Admission medications   Medication Sig Start Date End Date Taking? Authorizing Provider  Dexlansoprazole 30 MG capsule Take 30 mg by mouth daily as needed (for acid reflux).   Yes [provider]  furosemide (LASIX) 40 MG tablet Take 0.5 tablets (20 mg total) by mouth daily. 01/08/21  Yes Larey Dresser, MD  gabapentin (NEURONTIN) 300 MG capsule Take 300 mg by mouth 2 (two) times daily. 03/19/21  Yes [provider]  LORazepam (ATIVAN) 0.5 MG tablet Take 0.5 mg by mouth every 6 (six) hours as needed for anxiety. 03/19/21  Yes [provider]  prochlorperazine (COMPAZINE) 10 MG tablet Take 10 mg by mouth every 6 (six) hours as needed for nausea or vomiting. 03/19/21  Yes [provider]  spironolactone (ALDACTONE) 25 MG tablet Take 0.5 tablets (12.5 mg total) by mouth daily. 11/07/20  Yes Croitoru, Mihai, MD  aspirin 81 MG chewable tablet Chew 1 tablet (81 mg total) by mouth daily. Patient not taking: No sig reported 11/07/20   Croitoru, Mihai, MD  carvedilol (COREG) 3.125 MG tablet Take 1 tablet (3.125 mg total) by mouth 2 (two) times daily with a meal. Patient not taking: No sig reported 01/08/21   Larey Dresser, MD  dapagliflozin propanediol (FARXIGA) 10 MG  TABS tablet Take 1 tablet (10 mg total) by mouth daily before breakfast. Patient not taking: No sig reported 11/07/20   Croitoru, Mihai, MD  potassium chloride SA (KLOR-CON) 20 MEQ tablet Take 2 tablets (40 mEq total) by mouth daily. Patient not taking: No sig reported 11/14/20   Rafael Bihari, FNP  sacubitril-valsartan (ENTRESTO) 24-26 MG Take 1 tablet by mouth 2 (two) times daily. Patient not taking: No sig reported 10/28/20   Almyra Deforest, [place]

## 2021-04-07 NOTE — Progress Notes (Signed)
Nutrition Follow-up  DOCUMENTATION CODES:  Non-severe (moderate) malnutrition in context of chronic illness  INTERVENTION:  Continue Boost Plus TID.  Continue MVI with minerals daily.  Add Ensure Max po BID, each supplement provides 150 kcal and 30 grams of protein.   Encourage PO and supplement intake.  If poor PO intake continues, pt may need nutrition support to meet nutritional needs.  NUTRITION DIAGNOSIS:  Moderate Malnutrition related to chronic illness, cancer and cancer related treatments as evidenced by mild fat depletion, mild muscle depletion.  GOAL:  Patient will meet greater than or equal to 90% of their needs  MONITOR:  Diet advancement, PO intake, Supplement acceptance, Labs, Weight trends, I & O's  REASON FOR ASSESSMENT:  Consult Assessment of nutrition requirement/status  ASSESSMENT:  65 yo female with a PMH of malignant right breast cancer with axillary node involvement on active Kadcyla and radiation, chronic systolic CHF (EF 93%), X9KW, essential HTN, anemia/thrombocytopenia who is admitted with sepsis due to PNA.  Pt very quiet this morning. Reported little to no appetite.  Pt with no recent documentation of PO intake. Per Epic, on 11/18, pt ate 10% of breakfast and 20% of lunch.  Admit wt: 76.4 kg Current wt: 80.8 kg  Continue Boost Plus TID and MVI with minerals. Add Ensure Max BID as well for extra protein.  Supplements: Boost Plus TID  Medications: reviewed; MVI with minerals, Zofran PRN (given once today)  Labs: reviewed; Na 131 (L), Glucose 111 (H)  NUTRITION - FOCUSED PHYSICAL EXAM: Flowsheet Row Most Recent Value  Orbital Region Mild depletion  Upper Arm Region Mild depletion  Thoracic and Lumbar Region No depletion  Buccal Region Mild depletion  Temple Region Mild depletion  Clavicle Bone Region Mild depletion  Clavicle and Acromion Bone Region Mild depletion  Scapular Bone Region No depletion  Dorsal Hand Mild depletion   Patellar Region No depletion  Anterior Thigh Region No depletion  Posterior Calf Region No depletion  Edema (RD Assessment) Mild  [BLE]  Hair Reviewed  Eyes Reviewed  Mouth Reviewed  Skin Reviewed  Nails Reviewed   Diet Order:   Diet Order             Diet NPO time specified Except for: Sips with Meds  Diet effective now                  EDUCATION NEEDS:  Education needs have been addressed  Skin:  Skin Assessment: Reviewed RN Assessment  Last BM:  04/06/21 - Type 4, medium  Height:  Ht Readings from Last 1 Encounters:  03/30/21 5' 6.14" (1.68 m)   Weight:  Wt Readings from Last 1 Encounters:  04/07/21 80.8 kg   BMI:  Body mass index is 28.63 kg/m.  Estimated Nutritional Needs:  Kcal:  2100-2300 Protein:  95-110 grams Fluid:  >2.1 L  Derrel Nip, RD, LDN (she/her/hers) Clinical Inpatient Dietitian RD Pager/After-Hours/Weekend Pager # in [place]

## 2021-04-07 NOTE — Progress Notes (Addendum)
   04/06/21 2042  Provider Notification  Provider Name/Title Kennon Holter, NP  Date Provider Notified 04/06/21  Time Provider Notified 2042  Notification Type Page  Notification Reason Critical result  Test performed and critical result CO2 74.4  Date Critical Result Received 04/06/21  Time Critical Result Received 2039  Provider response See new orders  Date of Provider Response 04/06/21  Time of Provider Response 2042

## 2021-04-07 NOTE — Consult Note (Signed)
[facility] for Infectious Disease  Total days of antibiotics 9/vanco#5 & merepenem-acyclovir#1               Reason for Consult: pneumonia in immunocompromised host   Referring Physician: Karleen Hampshire  Principal Problem:   Sepsis due to pneumonia [facility]) Active Problems:   Anemia   Malignant neoplasm of upper-outer quadrant of right breast in female, estrogen receptor negative ([place])   HFrEF (heart failure with reduced ejection fraction) (HCC)   Hyponatremia   Thrombocytopenia (HCC)   Acute respiratory failure (HCC)   Malnutrition of moderate degree    HPI: Marie Church is a 65 y.o. female with history of breast cancer currently on kadcyla and radiation. Hx of T2DM, HTN, HF, who was admitted with productive cough, dyspnea, and wekaness. CXR showed patchy infiltrates. She was initially started on CAP coverage then expanded to vancomycin and cefepime (but vanco discontinued since MRSA PCR is negative). She continued to have ongoing fevers, repeat CT showing progression of process. She remains fatigue, hypoxic requiring supplemental oxygen. Family denies sick contacts. She is more solomnent where there is concern for meningitis, thus abtx changed to vanco/meropenem/acyclovir given concern for meningitis. ABG suggests hypercarbic process. RVP is negative. Ur Legionella negative. Sputum cx pending. Blood cx negative.     Past Medical History:  Diagnosis Date   Breast cancer ([place])    undergoing chemothearpy    DM type 2 (diabetes mellitus, type 2) ([place])    reportedly steroid induced   Hypertension     Allergies:  Allergies  Allergen Reactions   Doxycycline Rash   Amoxicillin-Pot Clavulanate Rash    Tolerates Rocephin   Lisinopril Rash   MEDICATIONS:  carvedilol  12.5 mg Oral BID WC   Chlorhexidine Gluconate Cloth  6 each Topical Daily   enoxaparin (LOVENOX) injection  40 mg Subcutaneous Q24H   lactose free nutrition  237 mL Oral TID WC   mouth rinse  15 mL Mouth Rinse BID    melatonin  3 mg Oral QHS   multivitamin with minerals  1 tablet Oral Daily   Ensure Max Protein  11 oz Oral BID   sacubitril-valsartan  1 tablet Oral BID   sodium chloride flush  3 mL Intravenous Q12H    Social History   Tobacco Use   Smoking status: Former    Types: Cigarettes   Smokeless tobacco: Never  Vaping Use   Vaping Use: Never used  Substance Use Topics   Alcohol use: Never   Drug use: Never    Family History  Problem Relation Age of Onset   Heart failure Neg Hx    Heart disease Neg Hx    Heart attack Neg Hx     Review of Systems - patient has decreased mentation. Unable to answer ROS   OBJECTIVE: Temp:  [97.5 F (36.4 C)-102.2 F (39 C)] 101 F (38.3 C) (11/21 1559) Pulse Rate:  [73-103] 95 (11/21 1559) Resp:  [16-20] 20 (11/21 1559) BP: (99-136)/(56-93) 136/82 (11/21 1559) SpO2:  [95 %-100 %] 97 % (11/21 1559) Weight:  [80.8 kg] 80.8 kg (11/21 0500) Physical Exam  Constitutional:  oriented to person, place, only. appears well-developed and well-nourished. No distress.  HENT: [place]/AT, PERRLA, no scleral icterus Mouth/Throat: Oropharynx is clear and moist. No oropharyngeal exudate.  Cardiovascular: Normal rate, regular rhythm and normal heart sounds. Exam reveals no gallop and no friction rub.  No murmur heard.  Pulmonary/Chest: Effort normal and breath sounds normal. No respiratory distress.  has  no wheezes.  Neck = supple, no nuchal rigidity Abdominal: Soft. Bowel sounds are normal.  exhibits no distension. There is no tenderness.  Lymphadenopathy: no cervical adenopathy. No axillary adenopathy Neurological: alert and oriented to person, place, only. Skin: Skin is warm and dry. No rash noted. No erythema.  Psychiatric: a normal mood and affect.  behavior is normal.    LABS: Results for orders placed or performed during the hospital encounter of 03/30/21 (from the past 48 hour(s))  CBC     Status: Abnormal   Collection Time: 04/06/21 12:09 AM  Result  Value Ref Range   WBC 4.3 4.0 - 10.5 K/uL   RBC 3.17 (L) 3.87 - 5.11 MIL/uL   Hemoglobin 8.2 (L) 12.0 - 15.0 g/dL   HCT 27.3 (L) 36.0 - 46.0 %   MCV 86.1 80.0 - 100.0 fL   MCH 25.9 (L) 26.0 - 34.0 pg   MCHC 30.0 30.0 - 36.0 g/dL   RDW 16.3 (H) 11.5 - 15.5 %   Platelets 114 (L) 150 - 400 K/uL    Comment: Immature Platelet Fraction may be clinically indicated, consider ordering this additional test [id number]    nRBC 0.0 0.0 - 0.2 %    Comment: Performed at [facility], [place] [address], [place], [place] [address]  Basic metabolic panel     Status: Abnormal   Collection Time: 04/06/21 12:09 AM  Result Value Ref Range   Sodium 131 (L) 135 - 145 mmol/L   Potassium 4.0 3.5 - 5.1 mmol/L   Chloride 95 (L) 98 - 111 mmol/L   CO2 33 (H) 22 - 32 mmol/L   Glucose, Bld 111 (H) 70 - 99 mg/dL    Comment: Glucose reference range applies only to samples taken after fasting for at least 8 hours.   BUN 7 (L) 8 - 23 mg/dL   Creatinine, Ser 0.49 0.44 - 1.00 mg/dL   Calcium 8.1 (L) 8.9 - 10.3 mg/dL   GFR, Estimated >60 >60 mL/min    Comment: (NOTE) Calculated using the CKD-EPI Creatinine Equation (2021)    Anion gap 3 (L) 5 - 15    Comment: Electrolytes repeated to confirm. Performed at [facility], [place] [address], [place], [place] [address]   Urinalysis, Routine w reflex microscopic Urine, Clean Catch     Status: Abnormal   Collection Time: 04/06/21  3:30 AM  Result Value Ref Range   Color, Urine STRAW (A) YELLOW   APPearance CLEAR CLEAR   Specific Gravity, Urine 1.006 1.005 - 1.030   pH 6.0 5.0 - 8.0   Glucose, UA NEGATIVE NEGATIVE mg/dL   Hgb urine dipstick NEGATIVE NEGATIVE   Bilirubin Urine NEGATIVE NEGATIVE   Ketones, ur NEGATIVE NEGATIVE mg/dL   Protein, ur NEGATIVE NEGATIVE mg/dL   Nitrite NEGATIVE NEGATIVE   Leukocytes,Ua NEGATIVE NEGATIVE    Comment: Performed at [employer] [address], [place], [place] [address]   Expectorated Sputum Assessment w Gram Stain, Rflx to Resp Cult     Status: None   Collection Time: 04/06/21 10:00 AM   Specimen: Expectorated Sputum  Result Value Ref Range   Specimen Description EXPECTORATED SPUTUM    Special Requests NONE    Sputum evaluation      THIS SPECIMEN IS ACCEPTABLE FOR SPUTUM CULTURE Performed at [facility], [place] [address], [place], [place] [address]    Report Status 04/06/2021 FINAL   Culture, Respiratory w Gram Stain  Status: None (Preliminary result)   Collection Time: 04/06/21 10:00 AM  Result Value Ref Range   Specimen Description      EXPECTORATED SPUTUM Performed at [facility], [place] [address], [place], [place] [address]    Special Requests      NONE Reflexed from [id number] Performed at [facility], [place] [address] [place], [place] [address]    Gram Stain      FEW SQUAMOUS EPITHELIAL CELLS PRESENT FEW WBC SEEN FEW YEAST MODERATE GRAM POSITIVE RODS MODERATE GRAM POSITIVE COCCI    Culture      CULTURE REINCUBATED FOR BETTER GROWTH Performed at [facility], [place] [address] [place], [place] [address]    Report Status PENDING   Blood gas, arterial     Status: Abnormal   Collection Time: 04/06/21  8:30 PM  Result Value Ref Range   pH, Arterial 7.302 (L) 7.350 - 7.450   pCO2 arterial 74.4 (HH) 32.0 - 48.0 mmHg    Comment: CRITICAL RESULT CALLED TO, READ BACK BY AND VERIFIED WITH: RN CHLOE ADDISON AT 2039 04/06/21 CRUICKSHANK A    pO2, Arterial 83.1 83.0 - 108.0 mmHg   Bicarbonate 34.6 (H) 20.0 - 28.0 mmol/L   Acid-Base Excess 8.0 (H) 0.0 - 2.0 mmol/L   O2 Saturation 94.0 %   Patient temperature 102.2    Allens test (pass/fail) PASS PASS    Comment: Performed at [facility], [place] [address], [place], [place] [address]  Creatinine, serum     Status: None   Collection Time: 04/07/21  1:00 AM  Result Value Ref Range   Creatinine, Ser 0.76 0.44 - 1.00  mg/dL   GFR, Estimated >60 >60 mL/min    Comment: (NOTE) Calculated using the CKD-EPI Creatinine Equation (2021) Performed at [facility], [place] [address] [place], [place] [address]   Blood gas, arterial     Status: Abnormal   Collection Time: 04/07/21  4:10 AM  Result Value Ref Range   FIO2 30.00    pH, Arterial 7.350 7.350 - 7.450   pCO2 arterial 63.2 (H) 32.0 - 48.0 mmHg   pO2, Arterial 93.6 83.0 - 108.0 mmHg   Bicarbonate 34.3 (H) 20.0 - 28.0 mmol/L   Acid-Base Excess 7.9 (H) 0.0 - 2.0 mmol/L   O2 Saturation 97.9 %   Patient temperature 97.5    Allens test (pass/fail) PASS PASS    Comment: Performed at [facility], [place] [address] [place],  [address]  Respiratory (~20 pathogens) panel by PCR     Status: None   Collection Time: 04/07/21  9:29 AM   Specimen: Nasopharyngeal Swab; Respiratory  Result Value Ref Range   Adenovirus NOT DETECTED NOT DETECTED   Coronavirus 229E NOT DETECTED NOT DETECTED    Comment: (NOTE) The Coronavirus on the Respiratory Panel, DOES NOT test for the novel  Coronavirus (2019 nCoV)    Coronavirus HKU1 NOT DETECTED NOT DETECTED   Coronavirus NL63 NOT DETECTED NOT DETECTED   Coronavirus OC43 NOT DETECTED NOT DETECTED   Metapneumovirus NOT DETECTED NOT DETECTED   Rhinovirus / Enterovirus NOT DETECTED NOT DETECTED   Influenza A NOT DETECTED NOT DETECTED   Influenza B NOT DETECTED NOT DETECTED   Parainfluenza Virus 1 NOT DETECTED NOT DETECTED   Parainfluenza Virus 2 NOT DETECTED NOT DETECTED   Parainfluenza Virus 3 NOT DETECTED NOT DETECTED   Parainfluenza Virus 4 NOT DETECTED NOT DETECTED   Respiratory Syncytial Virus NOT  DETECTED NOT DETECTED   Bordetella pertussis NOT DETECTED NOT DETECTED   Bordetella Parapertussis NOT DETECTED NOT DETECTED   Chlamydophila pneumoniae NOT DETECTED NOT DETECTED   Mycoplasma pneumoniae NOT DETECTED NOT DETECTED    Comment: Performed at [facility], [place] [address], [place], [place] [address]  Protein, CSF     Status: None   Collection Time: 04/07/21  2:28 PM  Result Value Ref Range   Total  Protein, CSF 28 15 - 45 mg/dL    Comment: Performed at [facility], [place] [address] [place], [place] [address]  Glucose, CSF     Status: None   Collection Time: 04/07/21  2:28 PM  Result Value Ref Range   Glucose, CSF 53 40 - 70 mg/dL    Comment: Performed at [facility], [place] [address], [place], [place] [address]  Comprehensive metabolic panel     Status: Abnormal   Collection Time: 04/07/21  3:19 PM  Result Value Ref Range   Sodium 131 (L) 135 - 145 mmol/L   Potassium 4.5 3.5 - 5.1 mmol/L   Chloride 96 (L) 98 - 111 mmol/L   CO2 32 22 - 32 mmol/L   Glucose, Bld 93 70 - 99 mg/dL    Comment: Glucose reference range applies only to samples taken after fasting for at least 8 hours.   BUN 9 8 - 23 mg/dL   Creatinine, Ser 0.63 0.44 - 1.00 mg/dL   Calcium 8.0 (L) 8.9 - 10.3 mg/dL   Total Protein 8.2 (H) 6.5 - 8.1 g/dL   Albumin 2.2 (L) 3.5 - 5.0 g/dL   AST 52 (H) 15 - 41 U/L   ALT 20 0 - 44 U/L   Alkaline Phosphatase 73 38 - 126 U/L   Total Bilirubin 0.5 0.3 - 1.2 mg/dL   GFR, Estimated >60 >60 mL/min    Comment: (NOTE) Calculated using the CKD-EPI Creatinine Equation (2021)    Anion gap 3 (L) 5 - 15    Comment: Performed at [facility], [place] [address], [place], [place] [address]    MICRO:  IMAGING: CT ABDOMEN PELVIS WO CONTRAST  Result Date: 04/06/2021 CLINICAL DATA:  Abdominal pain and fever EXAM: CT ABDOMEN AND PELVIS WITHOUT CONTRAST TECHNIQUE: Multidetector CT imaging of the abdomen and pelvis was performed following the standard protocol without IV contrast. COMPARISON:  03/28/2021 FINDINGS: LOWER CHEST: Severe bilateral multifocal airspace disease has worsened HEPATOBILIARY: Normal hepatic contours. No intra- or extrahepatic biliary dilatation. The gallbladder is normal. PANCREAS:  Normal pancreas. No ductal dilatation or peripancreatic fluid collection. SPLEEN: Normal. ADRENALS/URINARY TRACT: The adrenal glands are normal. No hydronephrosis, nephroureterolithiasis or solid renal mass. The urinary bladder is normal for degree of distention STOMACH/BOWEL: There is no hiatal hernia. Normal duodenal course and caliber. No small bowel dilatation or inflammation. No focal colonic abnormality. Normal appendix. VASCULAR/LYMPHATIC: Normal course and caliber of the major abdominal vessels. No abdominal or pelvic lymphadenopathy. REPRODUCTIVE: Normal uterus. No adnexal mass. MUSCULOSKELETAL. No bony spinal canal stenosis or focal osseous abnormality. OTHER: Post treatment changes of the right breast. IMPRESSION: 1. No acute abnormality of the abdomen or pelvis. 2. Severe bilateral multifocal pulmonary disease, worsened. This may indicate infection. Electronically Signed   By: Ulyses Jarred M.D.   On: 04/06/2021 22:40   MR BRAIN WO CONTRAST  Result Date: 04/06/2021 CLINICAL DATA:  Encephalopathy and fever of uncertain origin EXAM: MRI HEAD WITHOUT CONTRAST TECHNIQUE: Multiplanar, multiecho pulse sequences of the  brain and surrounding structures were obtained without intravenous contrast. COMPARISON:  None. FINDINGS: Markedly motion degraded study. Brain: No acute infarct, mass effect or extra-axial collection. No acute or chronic hemorrhage. Normal white matter signal, parenchymal volume and CSF spaces. The midline structures are normal. Vascular: Major flow voids are preserved. Skull and upper cervical spine: Normal calvarium and skull base. Visualized upper cervical spine and soft tissues are normal. Sinuses/Orbits:No paranasal sinus fluid levels or advanced mucosal thickening. No mastoid or middle ear effusion. Normal orbits. IMPRESSION: 1. Markedly motion degraded study. 2. No acute intracranial abnormality. Electronically Signed   By: Ulyses Jarred M.D.   On: 04/06/2021 22:16   VAS [place] LOWER  EXTREMITY VENOUS (DVT)  Result Date: 04/07/2021  Lower Venous DVT Study Patient Name:  Marie Church  Date of Exam:   04/07/2021 Medical Rec #: [id number]         Accession #:    [id number] Date of Birth: 09-13-1955         Patient Gender: F Patient Age:   62 years Exam Location[facility]  [facility] Procedure:      VAS [place] LOWER EXTREMITY VENOUS (DVT) Referring Phys: Hosie Poisson --------------------------------------------------------------------------------  Indications: Edema.  Risk Factors: CA patient on chemotherapy. Comparison Study: No previous exams Performing Technologist: Jody Hill RVT, RDMS  Examination Guidelines: A complete evaluation includes B-mode imaging, spectral Doppler, color Doppler, and power Doppler as needed of all accessible portions of each vessel. Bilateral testing is considered an integral part of a complete examination. Limited examinations for reoccurring indications may be performed as noted. The reflux portion of the exam is performed with the patient in reverse Trendelenburg.  +---------+---------------+---------+-----------+----------+--------------+ RIGHT    CompressibilityPhasicitySpontaneityPropertiesThrombus Aging +---------+---------------+---------+-----------+----------+--------------+ CFV      Full           Yes      Yes                                 +---------+---------------+---------+-----------+----------+--------------+ SFJ      Full                                                        +---------+---------------+---------+-----------+----------+--------------+ FV Prox  Full           Yes      Yes                                 +---------+---------------+---------+-----------+----------+--------------+ FV Mid   Full           Yes      Yes                                 +---------+---------------+---------+-----------+----------+--------------+ FV DistalFull           Yes      Yes                                  +---------+---------------+---------+-----------+----------+--------------+ PFV      Full                                                        +---------+---------------+---------+-----------+----------+--------------+  POP      Full           Yes      Yes                                 +---------+---------------+---------+-----------+----------+--------------+ PTV      Full                                                        +---------+---------------+---------+-----------+----------+--------------+ PERO     Full                                                        +---------+---------------+---------+-----------+----------+--------------+   +---------+---------------+---------+-----------+----------+--------------+ LEFT     CompressibilityPhasicitySpontaneityPropertiesThrombus Aging +---------+---------------+---------+-----------+----------+--------------+ CFV      Full           Yes      Yes                                 +---------+---------------+---------+-----------+----------+--------------+ SFJ      Full                                                        +---------+---------------+---------+-----------+----------+--------------+ FV Prox  Full           Yes      Yes                                 +---------+---------------+---------+-----------+----------+--------------+ FV Mid   Full           Yes      Yes                                 +---------+---------------+---------+-----------+----------+--------------+ FV DistalFull           Yes      Yes                                 +---------+---------------+---------+-----------+----------+--------------+ PFV      Full                                                        +---------+---------------+---------+-----------+----------+--------------+ POP      Full           Yes      Yes                                  +---------+---------------+---------+-----------+----------+--------------+ PTV  Full                                                        +---------+---------------+---------+-----------+----------+--------------+ PERO     Full                                                        +---------+---------------+---------+-----------+----------+--------------+     Summary: BILATERAL: - No evidence of deep vein thrombosis seen in the lower extremities, bilaterally. - No evidence of superficial venous thrombosis in the lower extremities, bilaterally. - RIGHT: - A cystic structure is found in the popliteal fossa.  LEFT: - No cystic structure found in the popliteal fossa.  *See table(s) above for measurements and observations. Electronically signed by Servando Snare MD on 04/07/2021 at 4:10:01 PM.    Final    DG FLUORO GUIDE LUMBAR PUNCTURE  Result Date: 04/07/2021 CLINICAL DATA:  Altered mental status. Clinical need for CSF analysis. EXAM: DIAGNOSTIC LUMBAR PUNCTURE UNDER FLUOROSCOPIC GUIDANCE COMPARISON:  MRI of the brain and abdominopelvic CT 04/06/2021. FLUOROSCOPY TIME:  Fluoroscopy Time: 12 seconds of low-dose pulsed fluoroscopy Radiation Exposure Index (if provided by the fluoroscopic device): 0.9 mGy Number of Acquired Spot Images: 0 PROCEDURE: Standard time-out was employed. I discussed the risks (including hemorrhage, infection, headache, and nerve damage, among others), benefits, and alternatives to fluoroscopically guided lumbar puncture with the patient's daughter. We specifically discussed the high technical likelihood of success of the procedure. The patient's daughter understood and elected to undergo the procedure. An appropriate site for lumbar puncture was marked on the patient's skin under fluoroscopic guidance. Following sterile skin prep and local anesthetic administration consisting of 1 percent lidocaine, a 22 gauge spinal needle was advanced without difficulty into the thecal  sac at the at the L2-3 level under fluoroscopic guidance. Clear CSF was returned. Opening pressure was 14 cm of water measured prone through the needle. 10.5 cc of clear CSF was collected. The needle was subsequently removed and the skin cleansed and bandaged. No immediate complications were observed. IMPRESSION: Diagnostic lumbar puncture performed without complication. Electronically Signed   By: Richardean Sale M.D.   On: 04/07/2021 15:42     Assessment/Plan:  65yo F with hx of breast ca admitted for cough, dyspnea, intermittent fevers not improved on 8 days of broad spectrum abtx. Respiratory viral panel negative. No other positive cultures.  - recommend to repeat chest CT - will follow up on respiratory culture but suspect may need bronch for better specimen  Meningitis/encephalitis = possibly from cefepime. Have low suspicion for bacterial meningitis. Will follow up on gram stain and cell count. If negative, would recommend to discontinue antibiotics

## 2021-04-07 NOTE — H&P (View-Only) (Signed)
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 Her workup was initially remarkable for diffuse bilateral ggo and she was started on ceftriaxone and azithromycin for CAP coverage. She was persistently febrile and on 11/17 was changed to vancomycin, cefepime. Negative MRSA swab so vanc discontinued. She is on cefepime day 4 but continues to be febrile and CT A/P 11/20 shows likely progression of same process present on 11/13. She tells me she feels tired and that she's coughing up whitish sputum.  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 rate 17, height 5' 6.14" (1.68 m), weight 80.8 kg, SpO2 99 %.        Intake/Output Summary (Last 24 hours) at 04/07/2021 0843 Last data filed at 04/07/2021 0456 Gross per 24 hour  Intake 600 ml  Output 500 ml  Net 100 ml   Filed Weights   04/03/21 0500 04/04/21 0500 04/07/21 0500  Weight: 79.1 kg 76.7 kg 80.8 kg    Examination: General appearance: 65 y.o., female, drowsy but arousable Eyes: anicteric sclerae,trackign HENT: NCAT; dry MM Neck: Trachea midline; no lymphadenopathy, no  JVD Lungs: Diminished bl, with normal respiratory effort CV: RRR, no MRGs  Abdomen: Soft, non-tender; non-distended, BS present  Extremities: No peripheral edema, radial and DP pulses present bilaterally  Skin: Normal temperature, turgor and texture; no rash Psych: Appropriate affect Neuro: Alert and oriented to person and place, no focal deficit    Resolved Hospital Problem list     Assessment & Plan:   # Acute hypoxic and acute on chronic hypercapnic respiratory failure # Widespread ggo with almost nodular appearance centered on bronchovascular bundles # Persistent fever Multifocal pneumonia from resistant typical or atypical bacteria, organizing pneumonia/pneumonitis from kadcyla/radiation, lymphangitic spread of cancer are considerations.  - agree with LP given encephalopathy - f/u respiratory cx, AFB cx - urine legionella, strep neg, BCx NGTD - reasonable to switch to meropenem given lack of improvement, worsening infiltrate, simultaneous concern for possibility of CNS infection - CT Chest - tentative plan for bronch/BAL/TBLB with EBUS if LAD present on CT Chest on Wednesday 11/23 at 1pm.   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 - on active treatment with kadcyla and radiation   Best Practice (right click and "Reselect all SmartList Selections" daily)   Per [facility]  Labs   CBC: Recent Labs  Lab 04/02/21 0527 04/03/21 0502 04/04/21 0359 04/05/21 0459 04/06/21 0009  WBC 7.2 6.1 6.1 4.5 4.3  HGB 9.0* 8.8* 8.9* 8.7* 8.2*  HCT  29.5* 28.7* 29.5* 28.6* 27.3*  MCV 85.5 86.4 86.5 87.2 86.1  PLT 118* 118* 126* 120* 114*    Basic Metabolic Panel: Recent Labs  Lab 04/01/21 0307 04/02/21 0527  04/04/21 0359 04/05/21 0459 04/06/21 0009 04/07/21 0100  NA 133* 132* 132* 135 131*  --   K 3.5 4.3 3.9 3.9 4.0  --   CL 99 99 99 100 95*  --   CO2 _0 32 33*  --   GLUCOSE 106* 102* 110* 102* 111*  --   BUN 7* 8 7* 9 7*  --   CREATININE 0.43* 0.55 0.52 0.51 0.49 0.76  CALCIUM 8.0* 8.5* 8.0* 8.4* 8.1*  --   MG 2.0 2.1  --   --   --   --    GFR: Estimated Creatinine Clearance: 77.4 mL/min (by C-G formula based on SCr of 0.76 mg/dL). Recent Labs  Lab 04/03/21 0502 04/04/21 0359 04/05/21 0459 04/05/21 1524 04/06/21 0009  PROCALCITON  --   --   --  0.18  --   WBC 6.1 6.1 4.5  --  4.3    Liver Function Tests: No results for input(s): AST, ALT, ALKPHOS, BILITOT, PROT, ALBUMIN in the last 168 hours. No results for input(s): LIPASE, AMYLASE in the last 168 hours. Recent Labs  Lab 04/05/21 1524  AMMONIA 24    ABG    Component Value Date/Time   PHART 7.350 04/07/2021 0410   PCO2ART 63.2 (H) 04/07/2021 0410   PO2ART 93.6 04/07/2021 0410   HCO3 34.3 (H) 04/07/2021 0410   TCO2 39 (H) 10/10/2020 0808   O2SAT 97.9 04/07/2021 0410     Coagulation Profile: No results for input(s): INR, PROTIME in the last 168 hours.  Cardiac Enzymes: No results for input(s): CKTOTAL, CKMB, CKMBINDEX, TROPONINI in the last 168 hours.  HbA1C: Hgb A1c MFr Bld  Date/Time Value Ref Range Status  10/04/2020 01:50 AM 6.4 (H) 4.8 - 5.6 % Final    Comment:    (NOTE) Pre diabetes:          5.7%-6.4%  Diabetes:              >6.4%  Glycemic control for   <7.0% adults with diabetes     CBG: Recent Labs  Lab 04/01/21 0734 04/01/21 1143  GLUCAP 117* 127*    Review of Systems:   Unreliable in setting of encephalopathy  Past Medical History:  She,  has a past medical history of Breast cancer ([place]), DM type 2 (diabetes mellitus, type 2) ([place]), and Hypertension.   Surgical History:   Past Surgical History:  Procedure Laterality Date   BREAST LUMPECTOMY Right 11/30/2020    RIGHT/LEFT HEART CATH AND CORONARY ANGIOGRAPHY N/A 10/10/2020   Procedure: RIGHT/LEFT HEART CATH AND CORONARY ANGIOGRAPHY;  Surgeon: Jolaine Artist, MD;  Location: [facility];  Service: Cardiovascular;  Laterality: N/A;     Social History:   reports that she has quit smoking. Her smoking use included cigarettes. She has never used smokeless tobacco. She reports that she does not drink alcohol and does not use drugs.   Family History:  Her family history is negative for Heart failure, Heart disease, and Heart attack.   Allergies Allergies  Allergen Reactions   Doxycycline Rash   Amoxicillin-Pot Clavulanate Rash    Tolerates Rocephin   Lisinopril Rash     Home Medications  Prior to Admission medications   Medication Sig Start Date End Date Taking? Authorizing Provider  Dexlansoprazole 30 MG capsule Take 30 mg by mouth daily as needed (for acid reflux).   Yes [provider]  furosemide (LASIX) 40 MG tablet Take 0.5 tablets (20 mg total) by mouth daily. 01/08/21  Yes Larey Dresser, MD  gabapentin (NEURONTIN) 300 MG capsule Take 300 mg by mouth 2 (two) times daily. 03/19/21  Yes [provider]  LORazepam (ATIVAN) 0.5 MG tablet Take 0.5 mg by mouth every 6 (six) hours as needed for anxiety. 03/19/21  Yes [provider]  prochlorperazine (COMPAZINE) 10 MG tablet Take 10 mg by mouth every 6 (six) hours as needed for nausea or vomiting. 03/19/21  Yes [provider]  spironolactone (ALDACTONE) 25 MG tablet Take 0.5 tablets (12.5 mg total) by mouth daily. 11/07/20  Yes Croitoru, Mihai, MD  aspirin 81 MG chewable tablet Chew 1 tablet (81 mg total) by mouth daily. Patient not taking: No sig reported 11/07/20   Croitoru, Mihai, MD  carvedilol (COREG) 3.125 MG tablet Take 1 tablet (3.125 mg total) by mouth 2 (two) times daily with a meal. Patient not taking: No sig reported 01/08/21   Larey Dresser, MD  dapagliflozin propanediol (FARXIGA) 10 MG  TABS tablet Take 1 tablet (10 mg total) by mouth daily before breakfast. Patient not taking: No sig reported 11/07/20   Croitoru, Mihai, MD  potassium chloride SA (KLOR-CON) 20 MEQ tablet Take 2 tablets (40 mEq total) by mouth daily. Patient not taking: No sig reported 11/14/20   Rafael Bihari, FNP  sacubitril-valsartan (ENTRESTO) 24-26 MG Take 1 tablet by mouth 2 (two) times daily. Patient not taking: No sig reported 10/28/20   Almyra Deforest, [place]

## 2021-04-07 NOTE — Progress Notes (Signed)
PROGRESS NOTE    Marie Church  XBL:[id number] DOB: [birth year] DOA: 03/30/2021 PCP: System, Provider Not In  Chief Complaint  Patient presents with   Pneumonia    Brief Narrative:  65 year old lady with prior history of malignant right breast cancer with axillary node involvement on active Kadcyla and radiation treatment, chronic systolic heart failure with improvement of left ventricular ejection fraction from 25% to 50%, type 2 diabetes mellitus, hypertension, anemia, thrombocytopenia presents to ED on 03/30/2021 for shortness of breath.  CT angiogram of the chest shows diffuse bilateral groundglass opacities consistent with pulmonary edema versus infectious process.  Patient was probably started on broad-spectrum IV antibiotics and referred to [facility] for admission for acute respiratory failure with hypoxia secondary to pneumonia in the setting of failed outpatient antibiotic treatment.  Her last CXR from 11/16 shows worsening aeration, then we changed antibiotics to vancomycin and cefepime . She remains on 2 lit of [place] oxygen . Despite broadening the coverage , pt continued to have low grade fever and high fever last night with T MAX OF 103, with lethargy. ABG showed hypercapnia, she was placed on BIPAP with some improvement in her mental status but is not back at baseline.    Pt seen this morning, continues to be lethargic, though she is oriented and following commands and answering questions appropriately.   Assessment & Plan:   Principal Problem:   Sepsis due to pneumonia [facility]) Active Problems:   Anemia   Malignant neoplasm of upper-outer quadrant of right breast in female, estrogen receptor negative (HCC)   HFrEF (heart failure with reduced ejection fraction) (HCC)   Hyponatremia   Thrombocytopenia (HCC)   Acute respiratory failure (HCC)   Malnutrition of moderate degree   Acute respiratory failure with hypoxia, sepsis secondary to bilateral pneumonia present on  admission. Differentials include radiation pneumonitis vs metastatic disease from breast cancer.  -in the setting of failed outpatient treatment for pneumonia. -Patient continues to require nasal cannula oxygen to keep sats greater than 90%. She is currently on 2 lit of [place] oxygen.  -CT angiogram of the chest showed diffuse ground glass opacities doe on 03/30/21.  -COVID-19 PCR, influenza PCR is negative, urine for strep pneumonia is negative,.blood cultures negative till date.  -Patient continues to have low grade fevers, at which point, broadened her antibiotics to vancomycin and cefepime. MRSA is negative. She has completed 5 days of IV cefepime,.  - Yesterday evening, pt had high grade fever of 103, became lethargic. - ABG showed hypercapnia, BIPAP was used with some improvement in her mental status.  - PCCM consulted this am, for possible bronchoscopy.  - RVP is negative - ID consulted for persistent fevers.   Persistent fevers Probably from worsening pneumonia.  WBC count within normal limits and procalcitonin is 0.18 COVID-19 PCR is negative, influenza PCR is negative, RVP is negative, urine analysis is negative for infection, CT abdomen and pelvis without contrast is negative for any acute intra-abdominal pathology. Blood cultures to date have been negative so far. Lower extremity duplex is negative for acute DVT Repeat CT chest without contrast ordered by PCCM Fluoroscopy guided LP done-CSF fluid sent for analysis. Neurology consulted and she was started on IV antibiotics empirically for meningitis. Discussed the plan with the patient and family members. PCCM on board to see if she needs bronchoscopy probably on Wednesday.   Acute toxic encephalopathy Differential include from worsening pneumonia vs meningitis/ encephalitis.  MRI brain without contrast is negative for acute intracranial abnormality. Neurology consulted  and fluoroscopy guided LP was done. Ammonia level is 24, TSH  is within normal limits   Hyponatremia Suspect hypovolemic hyponatremia in the setting of dehydration. Continue to monitor.   Chronic systolic heart failure/history of nonischemic cardiomyopathy. Echocardiogram on 03/03/2021 showed improvement in left ventricular ejection fraction to 50% compared to the previous echo showing left ventricular ejection fraction at 20 to 25%. Continue with Coreg at 12.5 mg twice daily, Entresto 24-26 mg twice daily. Holding Lasix and spironolactone for now.       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 She has one more week of radiation treatment.     Abdominal pain Improved.  abdominal x-ray does not show any bowel obstruction.    Pulmonary nodules Recommend outpatient follow-up to exclude metastatic disease.   Anemia/thrombocytopenia Probably secondary to chemotherapy Continue to monitor counts. Hemoglobin stable around 8 and platelet counts are improving.  Poor appetite and nausea:      DVT prophylaxis: (Lovenox) Code Status: (Full code) Family Communication: discussed with family at bedside.  Disposition:   Status is: Inpatient  Remains inpatient appropriate because: IV antibiotics.        Consultants:  PCCM Infectious disease Neurology  Procedures: Fluoroscopy guided LP MRI of the brain without contrast CT of the abdomen without contrast. CT of the chest without contrast.    Antimicrobials:  Antibiotics Given (last 72 hours)     Date/Time Action Medication Dose Rate   04/04/21 2025 New Bag/Given   ceFEPIme (MAXIPIME) 2 g in sodium chloride 0.9 % 100 mL IVPB 2 g 200 mL/hr   04/05/21 0432 New Bag/Given   ceFEPIme (MAXIPIME) 2 g in sodium chloride 0.9 % 100 mL IVPB 2 g 200 mL/hr   04/05/21  1230 New Bag/Given   ceFEPIme (MAXIPIME) 2 g in sodium chloride 0.9 % 100 mL IVPB 2 g 200 mL/hr   04/05/21 2100 New Bag/Given   ceFEPIme (MAXIPIME) 2 g in sodium chloride 0.9 % 100 mL IVPB 2 g 200 mL/hr   04/06/21 0310 New Bag/Given   ceFEPIme (MAXIPIME) 2 g in sodium chloride 0.9 % 100 mL IVPB 2 g 200 mL/hr   04/06/21 1140 New Bag/Given   ceFEPIme (MAXIPIME) 2 g in sodium chloride 0.9 % 100 mL IVPB 2 g 200 mL/hr   04/06/21 2015 New Bag/Given   ceFEPIme (MAXIPIME) 2 g in sodium chloride 0.9 % 100 mL IVPB 2 g 200 mL/hr   04/07/21 0437 New Bag/Given   ceFEPIme (MAXIPIME) 2 g in sodium chloride 0.9 % 100 mL IVPB 2 g 200 mL/hr   04/07/21 1146 New Bag/Given   meropenem (MERREM) 2 g in sodium chloride 0.9 % 100 mL IVPB 2 g 200 mL/hr   04/07/21 1352 New Bag/Given   acyclovir (ZOVIRAX) 800 mg in dextrose 5 % 150 mL IVPB 800 mg 166 mL/hr         Subjective: lethargic,  Denies any chest pain or sob.   Objective: Vitals:   04/07/21 0100 04/07/21 0406 04/07/21 0500 04/07/21 0714  BP: 101/70   118/65  Pulse: 80 82  80  Resp: 16 19  17   Temp: (!) 97.5 F (36.4 C)   100 F (37.8 C)  TempSrc: Axillary   Oral  SpO2: 100% 100%  99%  Weight:   80.8 kg   Height:  Intake/Output Summary (Last 24 hours) at 04/07/2021 1412 Last data filed at 04/07/2021 0901 Gross per 24 hour  Intake 600 ml  Output 600 ml  Net 0 ml    Filed Weights   04/03/21 0500 04/04/21 0500 04/07/21 0500  Weight: 79.1 kg 76.7 kg 80.8 kg    Examination:  General exam: Chronically ill-appearing lady not in any kind of distress on 2 L of nasal cannula oxygen Respiratory system: Diminished air entry at bases Cardiovascular system: S1 & S2 heard, RRR. No JVD, . No pedal edema. Gastrointestinal system: Abdomen is nondistended, soft and nontender.  Normal bowel sounds heard. Central nervous system: Lethargic, but answering questions appropriately, able to move all all extremities. Extremities: Symmetric 5 x 5  power. Skin: No rashes, lesions or ulcers Psychiatry: Mood is appropriate        Data Reviewed: I have personally reviewed following labs and imaging studies  CBC: Recent Labs  Lab 04/02/21 0527 04/03/21 0502 04/04/21 0359 04/05/21 0459 04/06/21 0009  WBC 7.2 6.1 6.1 4.5 4.3  HGB 9.0* 8.8* 8.9* 8.7* 8.2*  HCT 29.5* 28.7* 29.5* 28.6* 27.3*  MCV 85.5 86.4 86.5 87.2 86.1  PLT 118* 118* 126* 120* 114*     Basic Metabolic Panel: Recent Labs  Lab 04/01/21 0307 04/02/21 0527 04/04/21 0359 04/05/21 0459 04/06/21 0009 04/07/21 0100  NA 133* 132* 132* 135 131*  --   K 3.5 4.3 3.9 3.9 4.0  --   CL 99 99 99 100 95*  --   CO2 29 28 31  32 33*  --   GLUCOSE 106* 102* 110* 102* 111*  --   BUN 7* 8 7* 9 7*  --   CREATININE 0.43* 0.55 0.52 0.51 0.49 0.76  CALCIUM 8.0* 8.5* 8.0* 8.4* 8.1*  --   MG 2.0 2.1  --   --   --   --      GFR: Estimated Creatinine Clearance: 77.4 mL/min (by C-G formula based on SCr of 0.76 mg/dL).  Liver Function Tests: No results for input(s): AST, ALT, ALKPHOS, BILITOT, PROT, ALBUMIN in the last 168 hours.   CBG: Recent Labs  Lab 04/01/21 0734 04/01/21 1143  GLUCAP 117* 127*      Recent Results (from the past 240 hour(s))  Blood culture (routine x 2)     Status: None   Collection Time: 03/28/21  5:32 PM   Specimen: BLOOD  Result Value Ref Range Status   Specimen Description   Final    BLOOD LEFT ANTECUBITAL Performed at [facility] [address] [place], [place] [address]    Special Requests   Final    BOTTLES DRAWN AEROBIC AND ANAEROBIC Blood Culture results may not be optimal due to an inadequate volume of blood received in culture bottles Performed at [facility] [address] [place], [place] [address]    Culture   Final    NO GROWTH 5 DAYS Performed at [facility], [place] [address], [place], [place] [address]    Report Status 04/02/2021 FINAL  Final  Resp Panel by RT-PCR  (Flu A&B, Covid) Nasopharyngeal Swab     Status: None   Collection Time: 03/28/21  5:32 PM   Specimen: Nasopharyngeal Swab; Nasopharyngeal(NP) swabs in vial transport medium  Result Value Ref Range Status   SARS Coronavirus 2 by RT PCR NEGATIVE NEGATIVE Final    Comment: (NOTE) SARS-CoV-2 target nucleic acids are NOT DETECTED.  The SARS-CoV-2 RNA is generally detectable  in upper respiratory specimens during the acute phase of infection. The lowest concentration of SARS-CoV-2 viral copies this assay can detect is 138 copies/mL. A negative result does not preclude SARS-Cov-2 infection and should not be used as the sole basis for treatment or other patient management decisions. A negative result may occur with  improper specimen collection/handling, submission of specimen other than nasopharyngeal swab, presence of viral mutation(s) within the areas targeted by this assay, and inadequate number of viral copies(<138 copies/mL). A negative result must be combined with clinical observations, patient history, and epidemiological information. The expected result is Negative.  Fact Sheet for Patients:  EntrepreneurPulse.com.au  Fact Sheet for Healthcare Providers:  IncredibleEmployment.be  This test is no t yet approved or cleared by the [place] FDA and  has been authorized for detection and/or diagnosis of SARS-CoV-2 by FDA under an Emergency Use Authorization (EUA). This EUA will remain  in effect (meaning this test can be used) for the duration of the COVID-19 declaration under Section 564(b)(1) of the Act, 21 U.S.C.section 360bbb-3(b)(1), unless the authorization is terminated  or revoked sooner.       Influenza A by PCR NEGATIVE NEGATIVE Final   Influenza B by PCR NEGATIVE NEGATIVE Final    Comment: (NOTE) The Xpert Xpress SARS-CoV-2/FLU/RSV plus assay is intended as an aid in the diagnosis of influenza from Nasopharyngeal swab specimens  and should not be used as a sole basis for treatment. Nasal washings and aspirates are unacceptable for Xpert Xpress SARS-CoV-2/FLU/RSV testing.  Fact Sheet for Patients: EntrepreneurPulse.com.au  Fact Sheet for Healthcare Providers: IncredibleEmployment.be  This test is not yet approved or cleared by the [place] FDA and has been authorized for detection and/or diagnosis of SARS-CoV-2 by FDA under an Emergency Use Authorization (EUA). This EUA will remain in effect (meaning this test can be used) for the duration of the COVID-19 declaration under Section 564(b)(1) of the Act, 21 U.S.C. section 360bbb-3(b)(1), unless the authorization is terminated or revoked.  Performed at [facility], [place] [address], [place], [place] [address]   Blood culture (routine x 2)     Status: None   Collection Time: 03/28/21  5:37 PM   Specimen: BLOOD  Result Value Ref Range Status   Specimen Description   Final    BLOOD BLOOD LEFT WRIST Performed at [facility] [address] [place], [place] [address]    Special Requests   Final    BOTTLES DRAWN AEROBIC AND ANAEROBIC Blood Culture results may not be optimal due to an inadequate volume of blood received in culture bottles Performed at [facility] [address], [place], [place] [address]    Culture   Final    NO GROWTH 5 DAYS Performed at [facility], [place] [address], [place], [place] [address]    Report Status 04/02/2021 FINAL  Final  Resp Panel by RT-PCR (Flu A&B, Covid) Nasopharyngeal Swab     Status: None   Collection Time: 03/30/21  2:39 PM   Specimen: Nasopharyngeal Swab; Nasopharyngeal(NP) swabs in vial transport medium  Result Value Ref Range Status   SARS Coronavirus 2 by RT PCR NEGATIVE NEGATIVE Final    Comment: (NOTE) SARS-CoV-2 target nucleic acids are NOT DETECTED.  The SARS-CoV-2 RNA is generally detectable in upper  respiratory specimens during the acute phase of infection. The lowest concentration of SARS-CoV-2 viral copies this assay can detect is 138 copies/mL. A negative result does not preclude SARS-Cov-2 infection and  should not be used as the sole basis for treatment or other patient management decisions. A negative result may occur with  improper specimen collection/handling, submission of specimen other than nasopharyngeal swab, presence of viral mutation(s) within the areas targeted by this assay, and inadequate number of viral copies(<138 copies/mL). A negative result must be combined with clinical observations, patient history, and epidemiological information. The expected result is Negative.  Fact Sheet for Patients:  EntrepreneurPulse.com.au  Fact Sheet for Healthcare Providers:  IncredibleEmployment.be  This test is no t yet approved or cleared by the [place] FDA and  has been authorized for detection and/or diagnosis of SARS-CoV-2 by FDA under an Emergency Use Authorization (EUA). This EUA will remain  in effect (meaning this test can be used) for the duration of the COVID-19 declaration under Section 564(b)(1) of the Act, 21 U.S.C.section 360bbb-3(b)(1), unless the authorization is terminated  or revoked sooner.       Influenza A by PCR NEGATIVE NEGATIVE Final   Influenza B by PCR NEGATIVE NEGATIVE Final    Comment: (NOTE) The Xpert Xpress SARS-CoV-2/FLU/RSV plus assay is intended as an aid in the diagnosis of influenza from Nasopharyngeal swab specimens and should not be used as a sole basis for treatment. Nasal washings and aspirates are unacceptable for Xpert Xpress SARS-CoV-2/FLU/RSV testing.  Fact Sheet for Patients: EntrepreneurPulse.com.au  Fact Sheet for Healthcare Providers: IncredibleEmployment.be  This test is not yet approved or cleared by the [place] FDA and has been  authorized for detection and/or diagnosis of SARS-CoV-2 by FDA under an Emergency Use Authorization (EUA). This EUA will remain in effect (meaning this test can be used) for the duration of the COVID-19 declaration under Section 564(b)(1) of the Act, 21 U.S.C. section 360bbb-3(b)(1), unless the authorization is terminated or revoked.  Performed at [facility], [place] [address], [place], [place] [address]   Culture, blood (Routine x 2)     Status: None   Collection Time: 03/30/21  3:20 PM   Specimen: BLOOD  Result Value Ref Range Status   Specimen Description   Final    BLOOD PORTA CATH Performed at [facility] [address], [place], [place] [address]    Special Requests   Final    BOTTLES DRAWN AEROBIC AND ANAEROBIC Blood Culture adequate volume Performed at [facility] [address] [place], [place] [address]    Culture   Final    NO GROWTH 5 DAYS Performed at [facility], [place] [address], [place], [place] [address]    Report Status 04/04/2021 FINAL  Final  Culture, blood (Routine x 2)     Status: None   Collection Time: 03/30/21  3:27 PM   Specimen: BLOOD  Result Value Ref Range Status   Specimen Description   Final    BLOOD LEFT ANTECUBITAL Performed at [facility] [address], [place], [place] [address]    Special Requests   Final    BOTTLES DRAWN AEROBIC AND ANAEROBIC Blood Culture results may not be optimal due to an excessive volume of blood received in culture bottles Performed at [facility] [address] [place], [place] [address]    Culture   Final    NO GROWTH 5 DAYS Performed at [facility], [place] [address], [place], [place] [address]    Report Status 04/04/2021 FINAL  Final  Culture, blood (Routine X 2) w Reflex to ID Panel  Status: None (Preliminary result)   Collection Time: 04/02/21  6:26 PM   Specimen: BLOOD  Result Value Ref  Range Status   Specimen Description   Final    BLOOD BLOOD LEFT HAND Performed at [facility] [address], [place], [place] [address]    Special Requests   Final    BOTTLES DRAWN AEROBIC ONLY Blood Culture adequate volume Performed at [facility] [address], [place], [place] [address]    Culture   Final    NO GROWTH 4 DAYS Performed at [facility], [place] [address], [place], [place] [address]    Report Status PENDING  Incomplete  Culture, blood (Routine X 2) w Reflex to ID Panel     Status: None (Preliminary result)   Collection Time: 04/02/21  7:43 PM   Specimen: BLOOD LEFT WRIST  Result Value Ref Range Status   Specimen Description   Final    BLOOD LEFT WRIST Performed at [facility] [address] [place], [place] [address]    Special Requests   Final    BOTTLES DRAWN AEROBIC ONLY Blood Culture adequate volume Performed at [facility] [address], [place], [place] [address]    Culture   Final    NO GROWTH 4 DAYS Performed at [facility], [place] [address], [place], [place] [address]    Report Status PENDING  Incomplete  MRSA Next Gen by PCR, Nasal     Status: None   Collection Time: 04/03/21 11:26 AM   Specimen: Nasal Mucosa; Nasal Swab  Result Value Ref Range Status   MRSA by PCR Next Gen NOT DETECTED NOT DETECTED Final    Comment: (NOTE) The GeneXpert MRSA Assay (FDA approved for NASAL specimens only), is one component of a comprehensive MRSA colonization surveillance program. It is not intended to diagnose MRSA infection nor to guide or monitor treatment for MRSA infections. Test performance is not FDA approved in patients less than 35 years old. Performed at [facility], [place] [address], [place], [place] [address]   Culture, blood (routine x 2)     Status: None (Preliminary result)   Collection Time: 04/05/21  3:24 PM   Specimen: BLOOD   Result Value Ref Range Status   Specimen Description   Final    BLOOD BLOOD LEFT HAND Performed at [facility] [address], [place], [place] [address]    Special Requests   Final    BOTTLES DRAWN AEROBIC ONLY Blood Culture adequate volume Performed at [facility] [address] [place], [place] [address]    Culture   Final    NO GROWTH < 24 HOURS Performed at [facility] [address], [place], [place] [address]    Report Status PENDING  Incomplete  Culture, blood (routine x 2)     Status: None (Preliminary result)   Collection Time: 04/05/21  3:24 PM   Specimen: BLOOD  Result Value Ref Range Status   Specimen Description   Final    BLOOD BLOOD LEFT WRIST Performed at [facility] [address] [place], [place] [address]    Special Requests   Final    BOTTLES DRAWN AEROBIC ONLY Blood Culture adequate volume Performed at [facility] [address] [place], [place] [address]    Culture   Final    NO GROWTH < 24 HOURS Performed at [facility], [place] [address], [place], [place] [address]    Report Status PENDING  Incomplete  Expectorated Sputum Assessment w Gram Stain, Rflx to Resp Cult     Status: None   Collection Time: 04/06/21 10:00 AM   Specimen: Expectorated Sputum  Result Value Ref Range Status   Specimen Description EXPECTORATED SPUTUM  Final   Special Requests NONE  Final   Sputum evaluation   Final    THIS SPECIMEN IS ACCEPTABLE FOR SPUTUM CULTURE Performed at [facility], [place] [address], [place], [place] [address]    Report Status 04/06/2021 FINAL  Final  Culture, Respiratory w Gram Stain     Status: None (Preliminary result)   Collection Time: 04/06/21 10:00 AM  Result Value Ref Range Status   Specimen Description   Final    EXPECTORATED SPUTUM Performed at [facility] [address] [place], [place] [address]     Special Requests   Final    NONE Reflexed from [id number] Performed at [facility], [place] [address], [place], [place] [address]    Gram Stain   Final    FEW SQUAMOUS EPITHELIAL CELLS PRESENT FEW WBC SEEN FEW YEAST MODERATE GRAM POSITIVE RODS MODERATE GRAM POSITIVE COCCI    Culture   Final    CULTURE REINCUBATED FOR BETTER GROWTH Performed at [facility], [place] [address], [place], [place] [address]    Report Status PENDING  Incomplete  Respiratory (~20 pathogens) panel by PCR     Status: None   Collection Time: 04/07/21  9:29 AM   Specimen: Nasopharyngeal Swab; Respiratory  Result Value Ref Range Status   Adenovirus NOT DETECTED NOT DETECTED Final   Coronavirus 229E NOT DETECTED NOT DETECTED Final    Comment: (NOTE) The Coronavirus on the Respiratory Panel, DOES NOT test for the novel  Coronavirus (2019 nCoV)    Coronavirus HKU1 NOT DETECTED NOT DETECTED Final   Coronavirus NL63 NOT DETECTED NOT DETECTED Final   Coronavirus OC43 NOT DETECTED NOT DETECTED Final   Metapneumovirus NOT DETECTED NOT DETECTED Final   Rhinovirus / Enterovirus NOT DETECTED NOT DETECTED Final   Influenza A NOT DETECTED NOT DETECTED Final   Influenza B NOT DETECTED NOT DETECTED Final   Parainfluenza Virus 1 NOT DETECTED NOT DETECTED Final   Parainfluenza Virus 2 NOT DETECTED NOT DETECTED Final   Parainfluenza Virus 3 NOT DETECTED NOT DETECTED Final   Parainfluenza Virus 4 NOT DETECTED NOT DETECTED Final   Respiratory Syncytial Virus NOT DETECTED NOT DETECTED Final   Bordetella pertussis NOT DETECTED NOT DETECTED Final   Bordetella Parapertussis NOT DETECTED NOT DETECTED Final   Chlamydophila pneumoniae NOT DETECTED NOT DETECTED Final   Mycoplasma pneumoniae NOT DETECTED NOT DETECTED Final    Comment: Performed at [facility], [place] [address], [place], [place] [address]          Radiology Studies: CT ABDOMEN PELVIS WO CONTRAST  Result Date: 04/06/2021 CLINICAL  DATA:  Abdominal pain and fever EXAM: CT ABDOMEN AND PELVIS WITHOUT CONTRAST TECHNIQUE: Multidetector CT imaging of the abdomen and pelvis was performed following the standard protocol without IV contrast. COMPARISON:  03/28/2021 FINDINGS: LOWER CHEST: Severe bilateral multifocal airspace disease has worsened HEPATOBILIARY: Normal hepatic contours. No intra- or extrahepatic biliary dilatation. The gallbladder is normal. PANCREAS: Normal pancreas. No ductal dilatation or peripancreatic fluid collection. SPLEEN: Normal. ADRENALS/URINARY TRACT: The adrenal glands are normal. No hydronephrosis, nephroureterolithiasis or solid renal mass. The urinary bladder  is normal for degree of distention STOMACH/BOWEL: There is no hiatal hernia. Normal duodenal course and caliber. No small bowel dilatation or inflammation. No focal colonic abnormality. Normal appendix. VASCULAR/LYMPHATIC: Normal course and caliber of the major abdominal vessels. No abdominal or pelvic lymphadenopathy. REPRODUCTIVE: Normal uterus. No adnexal mass. MUSCULOSKELETAL. No bony spinal canal stenosis or focal osseous abnormality. OTHER: Post treatment changes of the right breast. IMPRESSION: 1. No acute abnormality of the abdomen or pelvis. 2. Severe bilateral multifocal pulmonary disease, worsened. This may indicate infection. Electronically Signed   By: Ulyses Jarred M.D.   On: 04/06/2021 22:40   DG Chest 2 View  Result Date: 04/05/2021 CLINICAL DATA:  Fever EXAM: CHEST - 2 VIEW COMPARISON:  04/03/2019 FINDINGS: Lung volumes are small, however, pulmonary insufflation appears stable since prior examination. Superimposed diffuse interstitial pulmonary infiltrate, atypical infection versus edema, appears stable since prior examination. No pneumothorax or pleural effusion. Cardiac size within normal limits. Left internal jugular chest port tip noted within the superior vena cava. No acute bone abnormality. IMPRESSION: Pulmonary hypoinflation, unchanged.  Stable diffuse interstitial pulmonary infiltrate, atypical infection versus edema. Electronically Signed   By: Fidela Salisbury M.D.   On: 04/05/2021 20:46   MR BRAIN WO CONTRAST  Result Date: 04/06/2021 CLINICAL DATA:  Encephalopathy and fever of uncertain origin EXAM: MRI HEAD WITHOUT CONTRAST TECHNIQUE: Multiplanar, multiecho pulse sequences of the brain and surrounding structures were obtained without intravenous contrast. COMPARISON:  None. FINDINGS: Markedly motion degraded study. Brain: No acute infarct, mass effect or extra-axial collection. No acute or chronic hemorrhage. Normal white matter signal, parenchymal volume and CSF spaces. The midline structures are normal. Vascular: Major flow voids are preserved. Skull and upper cervical spine: Normal calvarium and skull base. Visualized upper cervical spine and soft tissues are normal. Sinuses/Orbits:No paranasal sinus fluid levels or advanced mucosal thickening. No mastoid or middle ear effusion. Normal orbits. IMPRESSION: 1. Markedly motion degraded study. 2. No acute intracranial abnormality. Electronically Signed   By: Ulyses Jarred M.D.   On: 04/06/2021 22:16   VAS [place] LOWER EXTREMITY VENOUS (DVT)  Result Date: 04/07/2021  Lower Venous DVT Study Patient Name:  Marie Church  Date of Exam:   04/07/2021 Medical Rec #: [id number]         Accession #:    [id number] Date of Birth: 1955-10-02         Patient Gender: F Patient Age:   10 years Exam Location:  [facility] Procedure:      VAS [place] LOWER EXTREMITY VENOUS (DVT) Referring Phys: Hosie Poisson --------------------------------------------------------------------------------  Indications: Edema.  Risk Factors: CA patient on chemotherapy. Comparison Study: No previous exams Performing Technologist: Jody Hill RVT, RDMS  Examination Guidelines: A complete evaluation includes B-mode imaging, spectral Doppler, color Doppler, and power Doppler as needed of all accessible portions of each vessel.  Bilateral testing is considered an integral part of a complete examination. Limited examinations for reoccurring indications may be performed as noted. The reflux portion of the exam is performed with the patient in reverse Trendelenburg.  +---------+---------------+---------+-----------+----------+--------------+ RIGHT    CompressibilityPhasicitySpontaneityPropertiesThrombus Aging +---------+---------------+---------+-----------+----------+--------------+ CFV      Full           Yes      Yes                                 +---------+---------------+---------+-----------+----------+--------------+ SFJ      Full                                                        +---------+---------------+---------+-----------+----------+--------------+  FV Prox  Full           Yes      Yes                                 +---------+---------------+---------+-----------+----------+--------------+ FV Mid   Full           Yes      Yes                                 +---------+---------------+---------+-----------+----------+--------------+ FV DistalFull           Yes      Yes                                 +---------+---------------+---------+-----------+----------+--------------+ PFV      Full                                                        +---------+---------------+---------+-----------+----------+--------------+ POP      Full           Yes      Yes                                 +---------+---------------+---------+-----------+----------+--------------+ PTV      Full                                                        +---------+---------------+---------+-----------+----------+--------------+ PERO     Full                                                        +---------+---------------+---------+-----------+----------+--------------+   +---------+---------------+---------+-----------+----------+--------------+ LEFT      CompressibilityPhasicitySpontaneityPropertiesThrombus Aging +---------+---------------+---------+-----------+----------+--------------+ CFV      Full           Yes      Yes                                 +---------+---------------+---------+-----------+----------+--------------+ SFJ      Full                                                        +---------+---------------+---------+-----------+----------+--------------+ FV Prox  Full           Yes      Yes                                 +---------+---------------+---------+-----------+----------+--------------+ FV Mid   Full  Yes      Yes                                 +---------+---------------+---------+-----------+----------+--------------+ FV DistalFull           Yes      Yes                                 +---------+---------------+---------+-----------+----------+--------------+ PFV      Full                                                        +---------+---------------+---------+-----------+----------+--------------+ POP      Full           Yes      Yes                                 +---------+---------------+---------+-----------+----------+--------------+ PTV      Full                                                        +---------+---------------+---------+-----------+----------+--------------+ PERO     Full                                                        +---------+---------------+---------+-----------+----------+--------------+     Summary: BILATERAL: - No evidence of deep vein thrombosis seen in the lower extremities, bilaterally. - No evidence of superficial venous thrombosis in the lower extremities, bilaterally. - RIGHT: - A cystic structure is found in the popliteal fossa.  LEFT: - No cystic structure found in the popliteal fossa.  *See table(s) above for measurements and observations.    Preliminary         Scheduled Meds:  carvedilol  12.5 mg  Oral BID WC   Chlorhexidine Gluconate Cloth  6 each Topical Daily   enoxaparin (LOVENOX) injection  40 mg Subcutaneous Q24H   lactose free nutrition  237 mL Oral TID WC   mouth rinse  15 mL Mouth Rinse BID   melatonin  3 mg Oral QHS   multivitamin with minerals  1 tablet Oral Daily   Ensure Max Protein  11 oz Oral BID   sacubitril-valsartan  1 tablet Oral BID   sodium chloride flush  3 mL Intravenous Q12H   Continuous Infusions:  acyclovir (ZOVIRAX) [phone] mg IVPB 800 mg (04/07/21 1352)   meropenem (MERREM) IV 2 g (04/07/21 1146)   vancomycin       LOS: 7 days        Hosie Poisson, MD [facility]   To contact the attending provider between 7A-7P or the covering provider during after hours 7P-7A, please log into the web site [email or web address] and access using universal [place] password for that web site. If you do not have the password, please call  the hospital operator.  04/07/2021, 2:12 PM

## 2021-04-07 NOTE — Progress Notes (Signed)
Pharmacy Antibiotic Note  Marie Church is a 64 y.o. female admitted on 03/30/2021 for tx of PNA. Completed 4 days CTX/Azithromycin, followed by Vancomycin x1 dose, and Cefepime x 5 days. Continued fevers, increasing lethargy - Pharmacy has been consulted for Vancomycin/Meropenem/Acyclovir dosing to r/o meningitis. ID following  Plan: LP today 11/21 Meropenem 2gm q8 (for CTX & Amp - pt > 54yr) ** watch for seizure - increased risk Vanc 1gm q12 Acyclovir 800mg  q8   Height: 5' 6.14" (168 cm) Weight: 80.8 kg (178 lb 2.1 oz) IBW/kg (Calculated) : 59.63  Temp (24hrs), Avg:100.6 F (38.1 C), Min:97.5 F (36.4 C), Max:103 F (39.4 C)  Recent Labs  Lab 04/02/21 0527 04/03/21 0502 04/04/21 0359 04/05/21 0459 04/06/21 0009 04/07/21 0100  WBC 7.2 6.1 6.1 4.5 4.3  --   CREATININE 0.55  --  0.52 0.51 0.49 0.76    Estimated Creatinine Clearance: 77.4 mL/min (by C-G formula based on SCr of 0.76 mg/dL).    Allergies  Allergen Reactions   Doxycycline Rash   Amoxicillin-Pot Clavulanate Rash    Tolerates Rocephin   Lisinopril Rash    Antimicrobials this admission: 11/13 Rocephin/azithromycin >> 11/17 11/17 vancomycin >> 11/18 11/17 cefepime >> 11/21 11/21 Meropenem >> 11/21 resume Vancomycin >> 11/21 Acyclovir >>  Dose adjustments this admission:  Microbiology results: 11/17 MRSA PCR: not detected 11/16 BCx: ngtd 11/13 BCx: ngtd 11/11 BCx: NGF Resp panel neg x 2 11/21 LP:    Thank you for allowing pharmacy to be a part of this patient's care.  Minda Ditto 04/07/2021 10:38 AM

## 2021-04-07 NOTE — Progress Notes (Signed)
PT Cancellation Note  Patient Details Name: Marie Church MRN: [id number] DOB: 07-10-1955   Cancelled Treatment:    Reason Eval/Treat Not Completed: Medical issues which prohibited therapy Pt with decline in medical status requiring bipap , unstable for PT at this time. Abran Richard, PT Acute Rehab Services Pager [phone] [facility] [phone]   Marie Church 04/07/2021, 6:04 PM

## 2021-04-07 NOTE — Progress Notes (Signed)
BLE venous duplex has been completed.   Results can be found under chart review under CV PROC. 04/07/2021 1:52 PM Victoria Euceda RVT, RDMS

## 2021-04-07 NOTE — Consult Note (Signed)
Neurology Consultation  Reason for Consult: Persistent fevers.  Referring Physician: Venia Minks, MD.   CC: I'm tired.   History is obtained from: Family at bedside, chart.   HPI: Marie Church is a 65 y.o. 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 She has been on antibiotics for PNA, changed for sepsis. In spite of escalation of antibiotics, she continued to have fevers.   Workup thus far has shown: Neg CTA chest for PE but + for pulmonary nodules (? metastasis), UA negative, MRI brain negative for tumor/metastasis, elevated PCO2 which is now back to normal, normal procalcitonin, no leukocytosis, and TSH 1.143.  Tmax last 24 hours 102.68F. This am was 100 F. Overnight, was 97.25F. PCCM was consulted. Empiric Acyclovir was started today for questionable encephalitis and meningitis, and patient is going for LP under fluoroscopy shortly.   ROS: A robust ROS unable to be performed due to patient's avoidance to participate in exam. Per sister, patient had difficulty swallowing in the past, but weight has remained same.   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 Her smoking use included cigarettes. She has never used smokeless tobacco. She reports that she does not drink alcohol and does not use drugs.  Medications  Current Facility-Administered Medications:    acetaminophen (TYLENOL) tablet 650 mg, 650 mg, Oral, Q6H PRN, 650 mg at 04/06/21 1343 **OR** acetaminophen (TYLENOL) suppository 650 mg, 650 mg, Rectal, Q6H PRN, Lenore Cordia,  MD, 650 mg at 04/06/21 1958   acyclovir (ZOVIRAX) 800 mg in dextrose 5 % 150 mL IVPB, 800 mg, Intravenous, Q8H, Green, Terri L, RPH, Last Rate: 166 mL/hr at 04/07/21 1352, 800 mg at 04/07/21 1352   albuterol (PROVENTIL) (2.5 MG/3ML) 0.083% nebulizer solution 2.5 mg, 2.5 mg, Nebulization, Q4H PRN, Zada Finders R, MD, 2.5 mg at 04/01/21 2007   carvedilol (COREG) tablet 12.5 mg, 12.5 mg, Oral, BID WC, [place] Eric J, DO, 12.5 mg at 04/07/21 0919   Chlorhexidine Gluconate Cloth 2 % PADS 6 each, 6 each, Topical, Daily, Lenore Cordia, MD, 6 each at 04/07/21 0919   enoxaparin (LOVENOX) injection 40 mg, 40 mg, Subcutaneous, Q24H, Patel, Vishal R, MD, 40 mg at 04/06/21 2228   guaiFENesin-dextromethorphan (ROBITUSSIN DM) 100-10 MG/5ML syrup 5 mL, 5 mL, Oral, Q4H PRN, [place] Donnamarie Poag, DO, 5 mL at 04/06/21 6468   hydrALAZINE (APRESOLINE) tablet 25 mg, 25 mg, Oral, Q6H PRN, [place] Eric J, DO   lactose free nutrition (BOOST PLUS) liquid 237 mL, 237 mL, Oral, TID WC, [place] Eric J, DO, 237 mL at 04/07/21 0920   MEDLINE mouth rinse, 15 mL, Mouth Rinse, BID, Karleen Hampshire, Vijaya, MD, 15 mL at 04/07/21 0920   melatonin tablet 3 mg, 3 mg, Oral, QHS, Akula, Vijaya, MD   meropenem (MERREM) 2 g in sodium chloride 0.9 % 100 mL IVPB, 2 g, Intravenous, Q8H, Green, Terri L, RPH, Last Rate: 200 mL/hr  at 04/07/21 1146, 2 g at 04/07/21 1146   multivitamin with minerals tablet 1 tablet, 1 tablet, Oral, Daily, [place] Donnamarie Poag, DO, 1 tablet at 04/07/21 0919   ondansetron (ZOFRAN) tablet 4 mg, 4 mg, Oral, Q6H PRN **OR** ondansetron (ZOFRAN) injection 4 mg, 4 mg, Intravenous, Q6H PRN, Lenore Cordia, MD, 4 mg at 04/07/21 1884   protein supplement (ENSURE MAX) liquid, 11 oz, Oral, BID, Hosie Poisson, MD   sacubitril-valsartan (ENTRESTO) 24-26 mg per tablet, 1 tablet, Oral, BID, Lenore Cordia, MD, 1 tablet at 04/06/21 0841   senna-docusate (Senokot-S) tablet 1 tablet, 1 tablet, Oral, QHS PRN, Zada Finders R, MD   sodium chloride  flush (NS) 0.9 % injection 10-40 mL, 10-40 mL, Intracatheter, PRN, Zada Finders R, MD, 10 mL at 04/05/21 0500   sodium chloride flush (NS) 0.9 % injection 3 mL, 3 mL, Intravenous, Q12H, Posey Pronto, Vishal R, MD, 3 mL at 04/07/21 0920   vancomycin (VANCOREADY) IVPB 1000 mg/200 mL, 1,000 mg, Intravenous, Q12H, Nyoka Cowden, Terri L, RPH  Exam: Current vital signs: BP 118/65 (BP Location: Left Arm)   Pulse 80   Temp 100 F (37.8 C) (Oral)   Resp 17   Ht 5' 6.14" (1.68 m)   Wt 80.8 kg   SpO2 99%   BMI 28.63 kg/m  Vital signs in last 24 hours: Temp:  [97.5 F (36.4 C)-102.2 F (39 C)] 100 F (37.8 C) (11/21 0714) Pulse Rate:  [73-103] 80 (11/21 0714) Resp:  [16-20] 17 (11/21 0714) BP: (99-123)/(56-93) 118/65 (11/21 0714) SpO2:  [95 %-100 %] 99 % (11/21 0714) Weight:  [80.8 kg] 80.8 kg (11/21 0500)  PE: GENERAL: Quiet, acutely ill appearing but not toxic. Awake, but keeps her eyes closed a lot.  HEENT: normocephalic and atraumatic. Nuchal rigidity noted only on neck flexion.  LUNGS - Normal respiratory effort.  CV - RRR on tele. ABDOMEN - Soft, nontender. Ext: warm, well perfused. Psych: affect solemn.   NEURO:  Mental Status: She does not want to participate in exam and voices this to NP. She will do a few tasks that are asked like squeezing NPs fingers and moving her LEs.  Speech/Language: speech is without dysarthria or aphasia.  The only speech was when she said, "hey what is going on here" when NP attempted to check her reflexes in LUE.   Cranial Nerves:  Lid elevation is symmetric. Will not open eyes for pupil exam. Face is symmetrical at rest. Hearing intact to voice.   Motor:  BUE with grip 4/5. Will not perform rest of exam. She does move all extremities spontaneously and purposefully.  Tone is normal. Bulk is normal.  DTRs:  Will not allow exam.  Gait- deferred.  Labs I have reviewed labs in epic and the results pertinent to this consultation are: TSH: 1.143. CBG  111.  CBC    Component Value Date/Time   WBC 4.3 04/06/2021 0009   RBC 3.17 (L) 04/06/2021 0009   HGB 8.2 (L) 04/06/2021 0009   HCT 27.3 (L) 04/06/2021 0009   PLT 114 (L) 04/06/2021 0009   MCV 86.1 04/06/2021 0009   MCH 25.9 (L) 04/06/2021 0009   MCHC 30.0 04/06/2021 0009   RDW 16.3 (H) 04/06/2021 0009   LYMPHSABS 1.3 03/30/2021 1439   MONOABS 1.2 (H) 03/30/2021 1439   EOSABS 0.2 03/30/2021 1439   BASOSABS 0.0 03/30/2021 1439   CMP     Component Value Date/Time   NA 131 (L) 04/06/2021 0009   NA  143 10/28/2020 1510   K 4.0 04/06/2021 0009   CL 95 (L) 04/06/2021 0009   CO2 33 (H) 04/06/2021 0009   GLUCOSE 111 (H) 04/06/2021 0009   BUN 7 (L) 04/06/2021 0009   BUN 15 10/28/2020 1510   CREATININE 0.76 04/07/2021 0100   CALCIUM 8.1 (L) 04/06/2021 0009   PROT 9.1 (H) 03/30/2021 1439   ALBUMIN 2.9 (L) 03/30/2021 1439   AST 61 (H) 03/30/2021 1439   ALT 21 03/30/2021 1439   ALKPHOS 50 03/30/2021 1439   BILITOT 0.6 03/30/2021 1439   GFRNONAA >60 04/07/2021 0100   Imaging MRI brain 1. Markedly motion degraded study. 2. No acute intracranial abnormality  Assessment: 65 yo female with right breast cancer on chemotherapy and radiation therapy. She presented with sepsis from PNA and her fevers were attributed to this. Extensive testing for infection has been negative thus far except for above mentioned PNA.  However, patient continued fevers and other etiologies were considered, including encephalitis or meningitis. She was started on empiric Acyclovir this am and is going for LP under fluoroscopy.   Impression: -Persistent fevers with no response to escalated antibiotics.  -Nuchal rigidity and AMS.  -Right breast cancer.   Recommendations/Plan:  -Will follow up LP results. Cryptococcal antigen, HSV, protein, glucose, WBCC, cell count and differential, gram stain and culture.  -Continue Acylovir until HSV results as negative.  -Follow fever curve.  -Agree with ID consult.   -EEG to evaluate for etiology for AMS.  -May get MRI brain with and without tomorrow depending on ID consult and EEG results.   Pt seen by Clance Boll, NP/Neuro and later by MD. Note/plan to be edited by MD as needed.  Pager: [phone]   Attending Neurohospitalist Addendum Patient seen and examined. Agree with the history and physical as documented above. I examined the patient after LP and she was lethargic and nonparticipatory, oriented to first name only, mildly dysarthric, at least 4-/5 in all extremities, SILT, DTRs 2+ symmetric throughout. Agree with the plan as documented, which I helped formulate.  - LP results pending - Recommend ID consult and follow their recommendations for abx - Recommend acyclovir per pharm consult until HSV results negative - rEEG tomorrow - Consider repeat MRI brain 2/2 motion degraded initial study, this time with and without contrast 2/2 c/f meningitis/encephalitis  I have independently reviewed the chart, obtained history, review of systems and examined the patient.I have personally reviewed pertinent head/neck/spine imaging (CT/MRI). Please feel free to call with any questions.  -- Su Monks, MD [facility] [phone]  If 7pm- 7am, please page neurology on call as listed in [place].

## 2021-04-07 NOTE — Procedures (Signed)
Lumbar puncture performed at L2/3 on left without complication. Details dictated in Radiology report.

## 2021-04-08 ENCOUNTER — Inpatient Hospital Stay (HOSPITAL_COMMUNITY): Payer: Self-pay

## 2021-04-08 ENCOUNTER — Ambulatory Visit: Payer: Self-pay

## 2021-04-08 ENCOUNTER — Inpatient Hospital Stay (HOSPITAL_COMMUNITY)
Admit: 2021-04-08 | Discharge: 2021-04-08 | Disposition: A | Discharge status: Home / Self Care | Payer: Self-pay | Attending: Internal Medicine | Admitting: Internal Medicine

## 2021-04-08 DIAGNOSIS — G9341 Metabolic encephalopathy: Secondary | ICD-10-CM

## 2021-04-08 LAB — CULTURE, RESPIRATORY W GRAM STAIN: Culture: NORMAL

## 2021-04-08 LAB — HSV 1/2 PCR, CSF
HSV-1 DNA: NEGATIVE
HSV-2 DNA: NEGATIVE

## 2021-04-08 LAB — CRYPTOCOCCAL ANTIGEN, CSF: Crypto Ag: NEGATIVE

## 2021-04-08 IMAGING — MR MR HEAD WO/W CM
14 series · 48 of 48 positions shown · IV contrast (gadavist)
Comparison: [DATE]

CLINICAL DATA: Transient ischemic attack (TIA); acute stroke,
follow up Neuro deficit, acute, stroke suspected

EXAM:
MRI HEAD WITHOUT AND WITH CONTRAST
TECHNIQUE: Multiplanar, multiecho pulse sequences of the brain and surrounding
structures were obtained without and with intravenous contrast.
CONTRAST:  9mL GADAVIST GADOBUTROL 1 MMOL/ML IV SOLN

[Series 9: DWI · axial · 3.0mm · 1.36mm/px · z∈[-131,+8]mm · 6 of 96 slices shown (1 of 2)]
[im 1/96]
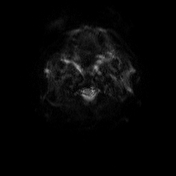
[im 20/96]
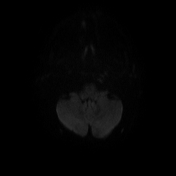
[im 39/96]
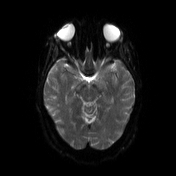
[im 58/96]
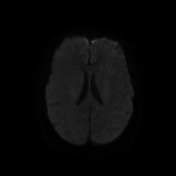
[im 77/96]
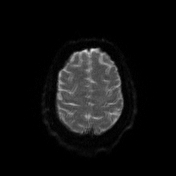
[im 96/96]
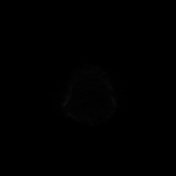

[Series 10: DWI · axial · 3.0mm · 1.36mm/px · z∈[-131,+8]mm · 3 of 48 slices shown (2 of 2)]
[im 1/48]
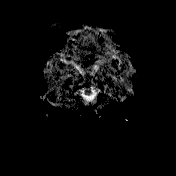
[im 24/48]
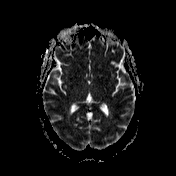
[im 48/48]
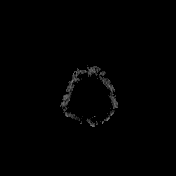

[Series 11: T1 · sagittal · 5.0mm · 0.75mm/px · 1 of 24 slices shown (1 of 2)]
[im 1/24]
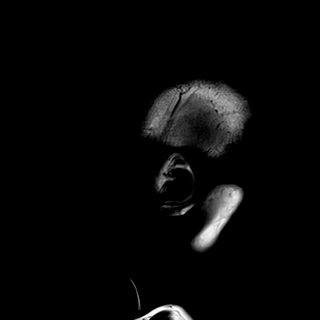

[Series 12: T2 · axial · 5.0mm · 0.62mm/px · 1 of 24 slices shown]
[im 1/24]
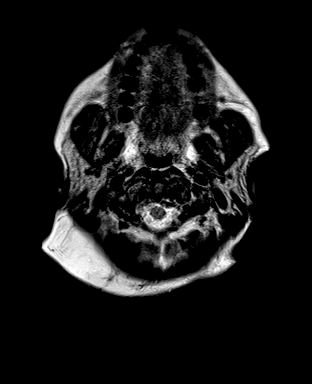

[Series 13: FLAIR · axial · 3.0mm · 0.75mm/px · z∈[-136,+15]mm · 3 of 52 slices shown]
[im 1/52]
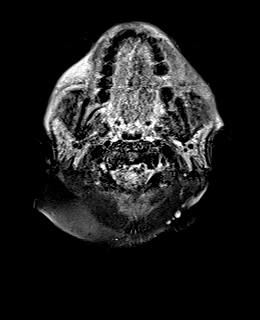
[im 26/52]
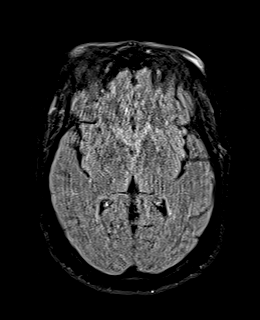
[im 52/52]
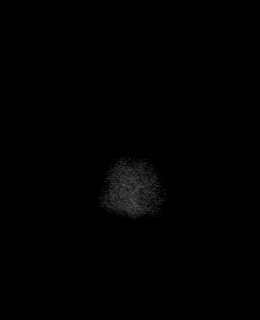

[Series 14: swi_images · axial · 3.0mm · 0.75mm/px · z∈[-136,+15]mm · 3 of 52 slices shown]
[im 1/52]
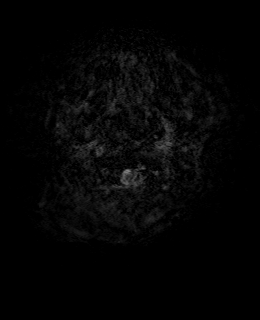
[im 26/52]
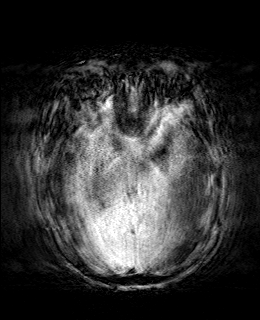
[im 52/52]
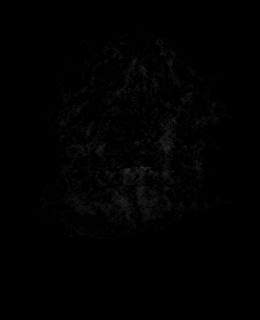

[Series 16: T1 · axial · 1.0mm · 0.94mm/px · z∈[-131,+11]mm · 9 of 144 slices shown (2 of 2)]
[im 1/144]
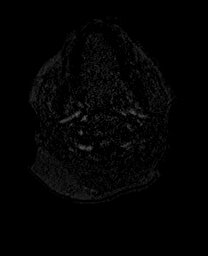
[im 18/144]
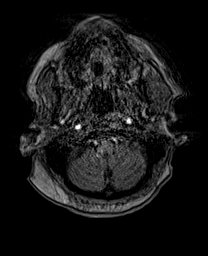
[im 36/144]
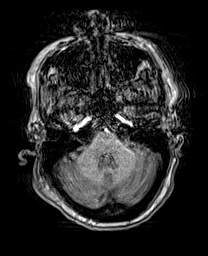
[im 54/144]
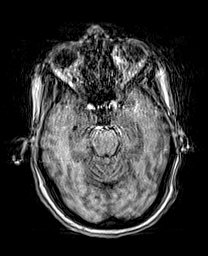
[im 72/144]
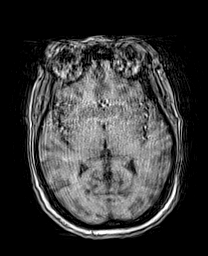
[im 90/144]
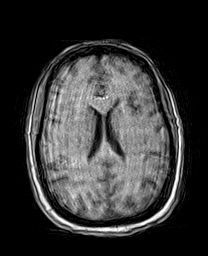
[im 108/144]
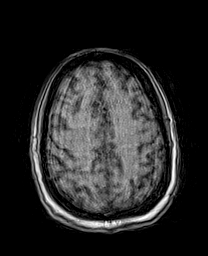
[im 126/144]
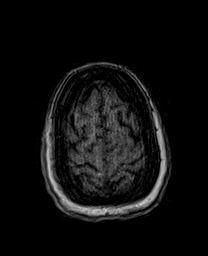
[im 144/144]
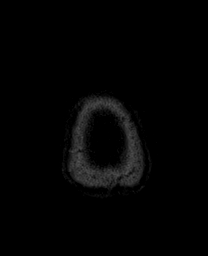

[Series 17: cor dwi_tracew · coronal · 5.0mm · 1.53mm/px · 3 of 52 slices shown]
[im 1/52]
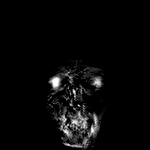
[im 26/52]
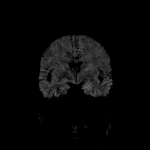
[im 52/52]
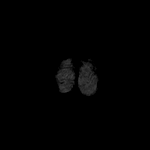

[Series 18: cor dwi_adc · coronal · 5.0mm · 1.53mm/px · 2 of 26 slices shown]
[im 1/26]
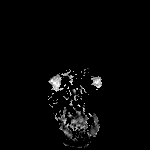
[im 26/26]
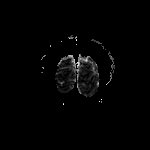

[Series 19: GRE · axial · 3.0mm · 0.45mm/px · z∈[-133,+15]mm · 3 of 51 slices shown]
[im 1/51]
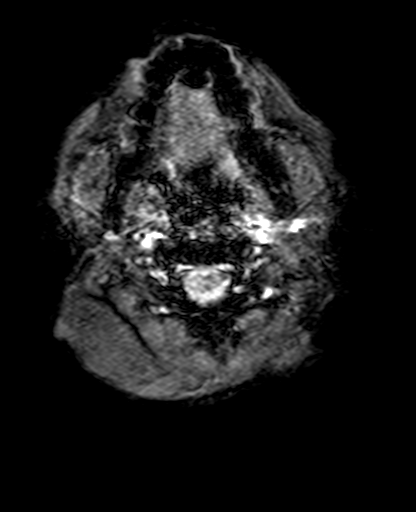
[im 26/51]
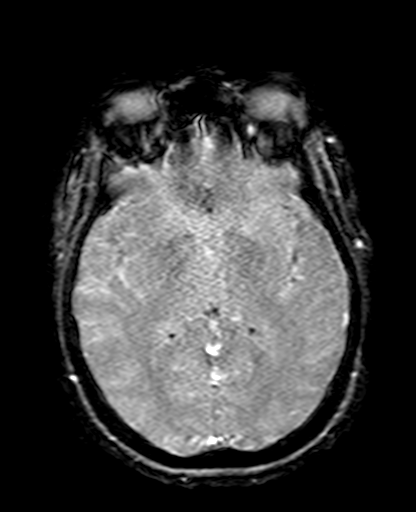
[im 51/51]
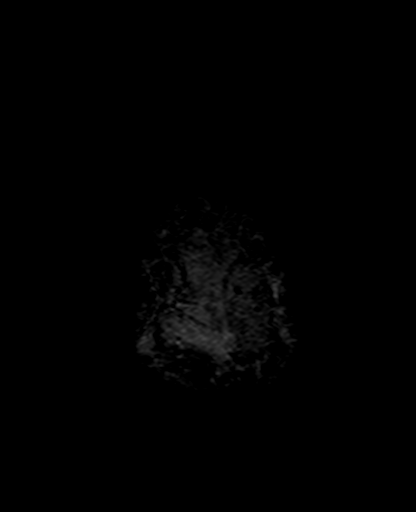

[Series 20: T2 post-contrast · coronal · 5.0mm · 0.57mm/px · 2 of 26 slices shown]
[im 1/26]
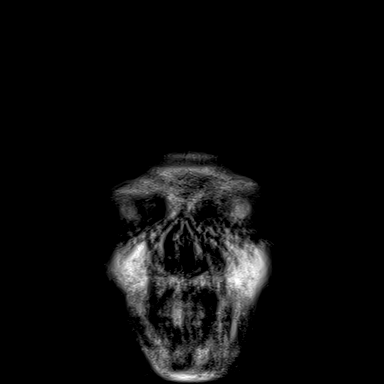
[im 26/26]
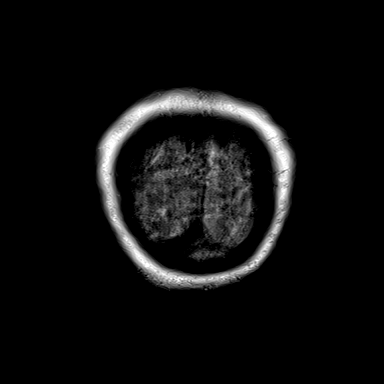

[Series 21: T1 post-contrast · axial · 1.0mm · 0.94mm/px · z∈[-131,+11]mm · 9 of 144 slices shown (1 of 3)]
[im 1/144]
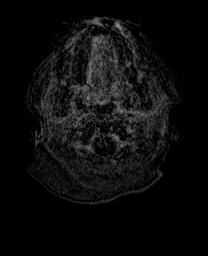
[im 18/144]
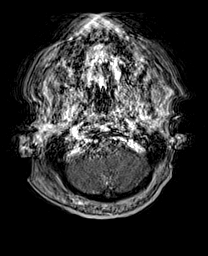
[im 36/144]
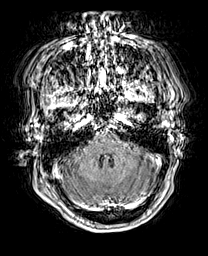
[im 54/144]
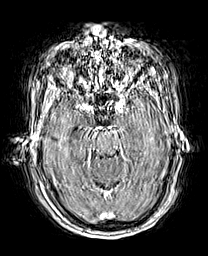
[im 72/144]
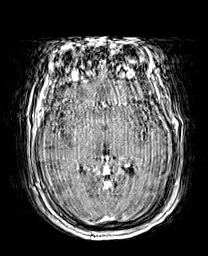
[im 90/144]
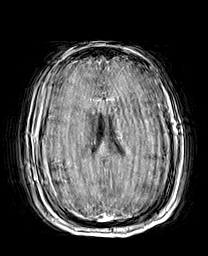
[im 108/144]
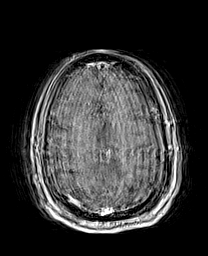
[im 126/144]
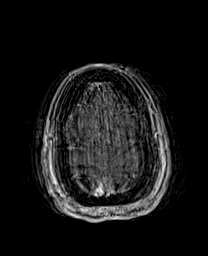
[im 144/144]
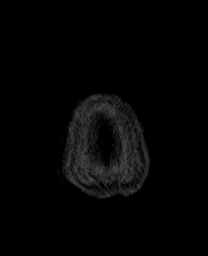

[Series 22: T1 post-contrast · coronal · 5.0mm · 0.43mm/px · 2 of 26 slices shown (2 of 3)]
[im 1/26]
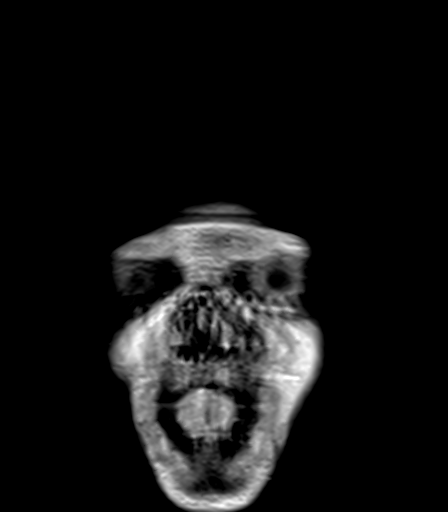
[im 26/26]
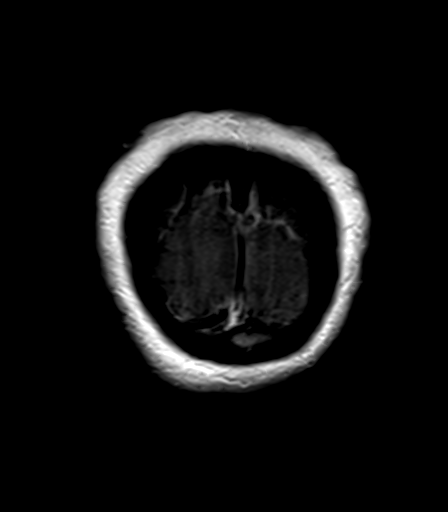

[Series 23: T1 post-contrast · sagittal · 5.0mm · 0.75mm/px · 1 of 24 slices shown (3 of 3)]
[im 1/24]
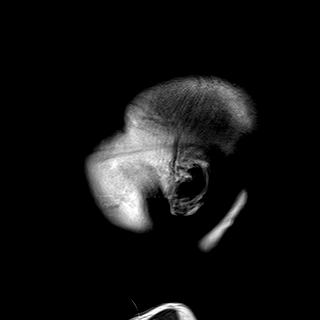

[48 of 48 positions shown; findings below may reference images not displayed]

FINDINGS: Motion artifact is present.

Brain: There is no acute infarction or intracranial hemorrhage.
There is no intracranial mass, mass effect, or edema. There is no
hydrocephalus or extra-axial fluid collection. Ventricles and sulci
are normal in size and configuration. Patchy foci of T2
hyperintensity in the supratentorial white matter are nonspecific
but may reflect stable chronic microvascular ischemic changes. No
definite abnormal enhancement.

Vascular: Major vessel flow voids at the skull base are preserved.

Skull and upper cervical spine: Normal marrow signal is preserved.

Sinuses/Orbits: Mild patchy mucosal thickening. Orbits are
unremarkable.

Other: Sella is unremarkable.  Mastoid air cells are clear.
IMPRESSION: Motion degraded.

No acute abnormality.  No definite abnormal enhancement.

## 2021-04-08 MED ORDER — CEFTRIAXONE SODIUM 2 G IJ SOLR
2.0000 g | INTRAMUSCULAR | Status: DC
  Administered 2021-04-08 – 2021-04-09 (×2): 2 g via INTRAVENOUS
  Filled 2021-04-08 (×2): qty 20

## 2021-04-08 MED ORDER — GADOBUTROL 1 MMOL/ML IV SOLN
9.0000 mL | Freq: Once | INTRAVENOUS | Status: AC | PRN
  Administered 2021-04-08: 9 mL via INTRAVENOUS

## 2021-04-08 MED ORDER — SALINE SPRAY 0.65 % NA SOLN
1.0000 | NASAL | Status: DC | PRN
  Administered 2021-04-12: 1 via NASAL
  Filled 2021-04-08: qty 44

## 2021-04-08 MED ORDER — SODIUM CHLORIDE 0.9 % IV SOLN
1.0000 g | Freq: Three times a day (TID) | INTRAVENOUS | Status: DC
  Filled 2021-04-08: qty 1

## 2021-04-08 NOTE — Progress Notes (Signed)
[facility] for Infectious Disease    Date of Admission:  03/30/2021   Total days of antibiotics 10   ID: Marie Church is a 65 y.o. female with breast cancer with  Principal Problem:   Community acquired pneumonia Active Problems:   Anemia   Malignant neoplasm of upper-outer quadrant of right breast in female, estrogen receptor negative (HCC)   HFrEF (heart failure with reduced ejection fraction) (HCC)   Hyponatremia   Thrombocytopenia (HCC)   Acute respiratory failure (HCC)   Malnutrition of moderate degree   Acute metabolic encephalopathy    Subjective: Afebrile. Less coughing. Feels better. Improved oral intake/appetite  Medications:   carvedilol  12.5 mg Oral BID WC   Chlorhexidine Gluconate Cloth  6 each Topical Daily   enoxaparin (LOVENOX) injection  40 mg Subcutaneous Q24H   lactose free nutrition  237 mL Oral TID WC   mouth rinse  15 mL Mouth Rinse BID   melatonin  3 mg Oral QHS   multivitamin with minerals  1 tablet Oral Daily   Ensure Max Protein  11 oz Oral BID   sacubitril-valsartan  1 tablet Oral BID   sodium chloride flush  3 mL Intravenous Q12H    Objective: Vital signs in last 24 hours: Temp:  [99.2 F (37.3 C)-101 F (38.3 C)] 99.2 F (37.3 C) (11/22 1309) Pulse Rate:  [71-95] 71 (11/22 1309) Resp:  [14-22] 14 (11/22 1309) BP: (89-136)/(50-82) 89/50 (11/22 1309) SpO2:  [95 %-100 %] 100 % (11/22 1309) Weight:  [84.4 kg] 84.4 kg (11/22 0550)  Physical Exam  Constitutional:  oriented to person, place, and time. appears well-developed and well-nourished. No distress.  HENT: [place]/AT, PERRLA, no scleral icterus Mouth/Throat: Oropharynx is clear and moist. No oropharyngeal exudate.  Cardiovascular: Normal rate, regular rhythm and normal heart sounds. Exam reveals no gallop and no friction rub.  No murmur heard.  Pulmonary/Chest: Effort normal and breath sounds normal. No respiratory distress.  has no wheezes.  Neck = supple, no nuchal  rigidity Abdominal: Soft. Bowel sounds are normal.  exhibits no distension. There is no tenderness.  Lymphadenopathy: no cervical adenopathy. No axillary adenopathy Neurological: alert and oriented to person, place, and time.  Skin: Skin is warm and dry. No rash noted. No erythema.  Psychiatric: a normal mood and affect.  behavior is normal.    Lab Results Recent Labs    04/06/21 0009 04/07/21 0100 04/07/21 1519  WBC 4.3  --  4.2  HGB 8.2*  --  8.7*  HCT 27.3*  --  29.4*  NA 131*  --  131*  K 4.0  --  4.5  CL 95*  --  96*  CO2 33*  --  32  BUN 7*  --  9  CREATININE 0.49 0.76 0.63   Liver Panel Recent Labs    04/07/21 1519  PROT 8.2*  ALBUMIN 2.2*  AST 52*  ALT 20  ALKPHOS 73  BILITOT 0.5   Sedimentation Rate No results for input(s): ESRSEDRATE in the last 72 hours. C-Reactive Protein No results for input(s): CRP in the last 72 hours.  Microbiology: reviewed Studies/Results: CT ABDOMEN PELVIS WO CONTRAST  Result Date: 04/06/2021 CLINICAL DATA:  Abdominal pain and fever EXAM: CT ABDOMEN AND PELVIS WITHOUT CONTRAST TECHNIQUE: Multidetector CT imaging of the abdomen and pelvis was performed following the standard protocol without IV contrast. COMPARISON:  03/28/2021 FINDINGS: LOWER CHEST: Severe bilateral multifocal airspace disease has worsened HEPATOBILIARY: Normal hepatic contours. No intra- or extrahepatic  biliary dilatation. The gallbladder is normal. PANCREAS: Normal pancreas. No ductal dilatation or peripancreatic fluid collection. SPLEEN: Normal. ADRENALS/URINARY TRACT: The adrenal glands are normal. No hydronephrosis, nephroureterolithiasis or solid renal mass. The urinary bladder is normal for degree of distention STOMACH/BOWEL: There is no hiatal hernia. Normal duodenal course and caliber. No small bowel dilatation or inflammation. No focal colonic abnormality. Normal appendix. VASCULAR/LYMPHATIC: Normal course and caliber of the major abdominal vessels. No  abdominal or pelvic lymphadenopathy. REPRODUCTIVE: Normal uterus. No adnexal mass. MUSCULOSKELETAL. No bony spinal canal stenosis or focal osseous abnormality. OTHER: Post treatment changes of the right breast. IMPRESSION: 1. No acute abnormality of the abdomen or pelvis. 2. Severe bilateral multifocal pulmonary disease, worsened. This may indicate infection. Electronically Signed   By: Ulyses Jarred M.D.   On: 04/06/2021 22:40   CT CHEST WO CONTRAST  Result Date: 04/08/2021 CLINICAL DATA:  Chest pain EXAM: CT CHEST WITHOUT CONTRAST TECHNIQUE: Multidetector CT imaging of the chest was performed following the standard protocol without IV contrast. COMPARISON:  CT chest dated March 30, 2021 FINDINGS: Cardiovascular: Cardiomegaly. No pericardial effusion. No significant coronary artery calcifications. Atherosclerotic disease of the thoracic aorta. Left chest wall port with tip near the superior cavoatrial junction. Mediastinum/Nodes: Mildly enlarged left axillary lymph node measuring 1.2 cm in short axis, unchanged compared to prior exam. Unchanged subcarinal lymph node measuring 1.3 cm in short axis on series 2, image 61. Lungs/Pleura: Central airways are patent. Bilateral patchy ground-glass opacities, increased compared to prior exam. Smooth interlobular septal thickening. Trace bilateral pleural effusions and atelectasis. Upper Abdomen: No acute abnormality. Musculoskeletal: No chest wall mass or suspicious bone lesions identified. IMPRESSION: 1. Patchy bilateral ground-glass opacities and smooth interlobular septal thickening, increased compared to prior exam, differential includes pulmonary edema versus multifocal infection. 2. Trace bilateral pleural effusions and atelectasis. 3. Unchanged mildly enlarged mediastinal and left axillary lymph nodes, likely reactive. 4.  Aortic Atherosclerosis (ICD10-I70.0). Electronically Signed   By: Yetta Glassman M.D.   On: 04/08/2021 08:17   MR BRAIN WO  CONTRAST  Result Date: 04/06/2021 CLINICAL DATA:  Encephalopathy and fever of uncertain origin EXAM: MRI HEAD WITHOUT CONTRAST TECHNIQUE: Multiplanar, multiecho pulse sequences of the brain and surrounding structures were obtained without intravenous contrast. COMPARISON:  None. FINDINGS: Markedly motion degraded study. Brain: No acute infarct, mass effect or extra-axial collection. No acute or chronic hemorrhage. Normal white matter signal, parenchymal volume and CSF spaces. The midline structures are normal. Vascular: Major flow voids are preserved. Skull and upper cervical spine: Normal calvarium and skull base. Visualized upper cervical spine and soft tissues are normal. Sinuses/Orbits:No paranasal sinus fluid levels or advanced mucosal thickening. No mastoid or middle ear effusion. Normal orbits. IMPRESSION: 1. Markedly motion degraded study. 2. No acute intracranial abnormality. Electronically Signed   By: Ulyses Jarred M.D.   On: 04/06/2021 22:16   VAS [place] LOWER EXTREMITY VENOUS (DVT)  Result Date: 04/07/2021  Lower Venous DVT Study Patient Name:  Marie Church  Date of Exam:   04/07/2021 Medical Rec #: [id number]         Accession #:    [id number] Date of Birth: December 29, 1955         Patient Gender: F Patient Age:   69 years Exam Location:  [facility] Procedure:      VAS [place] LOWER EXTREMITY VENOUS (DVT) Referring Phys: Hosie Poisson --------------------------------------------------------------------------------  Indications: Edema.  Risk Factors: CA patient on chemotherapy. Comparison Study: No previous exams Performing Technologist: Jody Hill RVT, RDMS  Examination Guidelines: A complete evaluation includes B-mode imaging, spectral Doppler, color Doppler, and power Doppler as needed of all accessible portions of each vessel. Bilateral testing is considered an integral part of a complete examination. Limited examinations for reoccurring indications may be performed as noted. The reflux portion  of the exam is performed with the patient in reverse Trendelenburg.  +---------+---------------+---------+-----------+----------+--------------+ RIGHT    CompressibilityPhasicitySpontaneityPropertiesThrombus Aging +---------+---------------+---------+-----------+----------+--------------+ CFV      Full           Yes      Yes                                 +---------+---------------+---------+-----------+----------+--------------+ SFJ      Full                                                        +---------+---------------+---------+-----------+----------+--------------+ FV Prox  Full           Yes      Yes                                 +---------+---------------+---------+-----------+----------+--------------+ FV Mid   Full           Yes      Yes                                 +---------+---------------+---------+-----------+----------+--------------+ FV DistalFull           Yes      Yes                                 +---------+---------------+---------+-----------+----------+--------------+ PFV      Full                                                        +---------+---------------+---------+-----------+----------+--------------+ POP      Full           Yes      Yes                                 +---------+---------------+---------+-----------+----------+--------------+ PTV      Full                                                        +---------+---------------+---------+-----------+----------+--------------+ PERO     Full                                                        +---------+---------------+---------+-----------+----------+--------------+   +---------+---------------+---------+-----------+----------+--------------+ LEFT  CompressibilityPhasicitySpontaneityPropertiesThrombus Aging +---------+---------------+---------+-----------+----------+--------------+ CFV      Full           Yes      Yes                                  +---------+---------------+---------+-----------+----------+--------------+ SFJ      Full                                                        +---------+---------------+---------+-----------+----------+--------------+ FV Prox  Full           Yes      Yes                                 +---------+---------------+---------+-----------+----------+--------------+ FV Mid   Full           Yes      Yes                                 +---------+---------------+---------+-----------+----------+--------------+ FV DistalFull           Yes      Yes                                 +---------+---------------+---------+-----------+----------+--------------+ PFV      Full                                                        +---------+---------------+---------+-----------+----------+--------------+ POP      Full           Yes      Yes                                 +---------+---------------+---------+-----------+----------+--------------+ PTV      Full                                                        +---------+---------------+---------+-----------+----------+--------------+ PERO     Full                                                        +---------+---------------+---------+-----------+----------+--------------+     Summary: BILATERAL: - No evidence of deep vein thrombosis seen in the lower extremities, bilaterally. - No evidence of superficial venous thrombosis in the lower extremities, bilaterally. - RIGHT: - A cystic structure is found in the popliteal fossa.  LEFT: - No cystic structure found in the popliteal fossa.  *See table(s) above for measurements and observations. Electronically signed by Servando Snare MD on 04/07/2021  at 4:10:01 PM.    Final    DG FLUORO GUIDE LUMBAR PUNCTURE  Result Date: 04/07/2021 CLINICAL DATA:  Altered mental status. Clinical need for CSF analysis. EXAM: DIAGNOSTIC LUMBAR PUNCTURE UNDER FLUOROSCOPIC  GUIDANCE COMPARISON:  MRI of the brain and abdominopelvic CT 04/06/2021. FLUOROSCOPY TIME:  Fluoroscopy Time: 12 seconds of low-dose pulsed fluoroscopy Radiation Exposure Index (if provided by the fluoroscopic device): 0.9 mGy Number of Acquired Spot Images: 0 PROCEDURE: Standard time-out was employed. I discussed the risks (including hemorrhage, infection, headache, and nerve damage, among others), benefits, and alternatives to fluoroscopically guided lumbar puncture with the patient's daughter. We specifically discussed the high technical likelihood of success of the procedure. The patient's daughter understood and elected to undergo the procedure. An appropriate site for lumbar puncture was marked on the patient's skin under fluoroscopic guidance. Following sterile skin prep and local anesthetic administration consisting of 1 percent lidocaine, a 22 gauge spinal needle was advanced without difficulty into the thecal sac at the at the L2-3 level under fluoroscopic guidance. Clear CSF was returned. Opening pressure was 14 cm of water measured prone through the needle. 10.5 cc of clear CSF was collected. The needle was subsequently removed and the skin cleansed and bandaged. No immediate complications were observed. IMPRESSION: Diagnostic lumbar puncture performed without complication. Electronically Signed   By: Richardean Sale M.D.   On: 04/07/2021 15:42     Assessment/Plan: 65yo F with breast cancer admitted for hypoxia, pulmonary infiltrates treated for 10 days with broad spectrum abtx. Still intermittent fevers, Chest CT showing patchy bilateral infiltrates possible pulm edema vs atypical infection.   Ruled out meningitis yesterday as LP showed WBC 2. Nothing on gram stain - will d/c vancomycin and acyclovir - will change back to ceftriaxone, though this is day 10 of abtx. Recommend that this is the last day of abtx unless other findings on bronch tomorrow - sputum culture showed normal flora -  scheduled for bronchoscopy - please get aerobic, fungal and afb culture, cell count - defer to pulmonary to see if this could be bronchiolitis obliterans organizing pneumonia -and may not need antimicrobials.  [facility] for Infectious Diseases Pager: [phone]  04/08/2021, 1:46 PM

## 2021-04-08 NOTE — Progress Notes (Signed)
OT Cancellation Note  Patient Details Name: Rushie Brazel MRN: [id number] DOB: June 01, 1955   Cancelled Treatment:    Reason Eval/Treat Not Completed: Fatigue/lethargy limiting ability to participate;Medical issues which prohibited therapy. Pt lethargic in bed with difficulty keeping eyes open. Pt with family member in room who stated that pt's BP was low when recently taken in bed. Pt also refusing therapy today due to fatigue. Pt and family member state that pt is getting up with Nursing and ambulating to bathroom. Pt encouraged to continue this and to get OOB with nursing as much as able. Pt agreed to this.   Julien Girt 04/08/2021, 1:15 PM

## 2021-04-08 NOTE — Procedures (Signed)
Patient Name: Marie Church  MRN: [id number]  Epilepsy Attending: Lora Havens  Referring Physician/Provider: Gardiner Barefoot, NP Date: 04/08/2021 Duration: 24.15 mins  Patient history: 65 year old female with right breast cancer on chemotherapy who presented with altered mental status.  EEG to evaluate for seizure.  Level of alertness: Awake   AEDs during EEG study: None  Technical aspects: This EEG study was done with scalp electrodes positioned according to the 10-20 International system of electrode placement. Electrical activity was acquired at a sampling rate of 500Hz  and reviewed with a high frequency filter of 70Hz  and a low frequency filter of 1Hz . EEG data were recorded continuously and digitally stored.   Description: The posterior dominant rhythm consists of 8Hz  activity of moderate voltage (25-35 uV) seen predominantly in posterior head regions, symmetric and reactive to eye opening and eye closing. EEG showed intermittent generalized polymorphic 3 to 6 Hz theta-delta slowing, at times with triphasic morphology. Hyperventilation and photic stimulation were not performed.     ABNORMALITY - Intermittent slow, generalized  IMPRESSION: This study is suggestive of mild diffuse encephalopathy, nonspecific etiology. No seizures or epileptiform discharges were seen throughout the recording.  Oran Dillenburg Barbra Sarks

## 2021-04-08 NOTE — Evaluation (Signed)
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    Pt s/p LP which was negative = concerns for encephalitis/mengitis was noted.  CXR showed patchy infiltrates.  Pt has intermittently having fevers.  Prior tobacco use.  Per RN, MD and pt's son, pt's mentation is much improved today.  She continues with delayed processing.  Swallow eval ordered.    Assessment / Plan / Recommendation  Clinical Impression  Patient presents with generalized oral weakness and delayed processing of information.  She also has right facial asymmetry *minimal* - which appears to be baseline from review of prior pictures son had on phone.  Pt only observed consuming gingerale, water and a single bolus of cracker.  She politely declined further intake.  Pt does admit to occasional issues with food retention in oral cavity - right and left buccal regions - stating this has been occuring over the last few weeks.  Advised she assure oral clearance acheived when finished with her meal.  Session abbreviated also by EEG staff and MD needing to see pt.   Educated pt and her son Marie Church* to general aspiration precautions.    No SLP follow up indicated. SLP Visit Diagnosis: Dysphagia, oral phase (R13.11)    Aspiration Risk  Mild aspiration risk (due to mentation)    Diet Recommendation Regular;Thin liquid   Liquid Administration via: Cup;Straw Medication Administration:  (as tolerated) Supervision: Comment (son can feed pt) Compensations: Slow rate;Small sips/bites;Other (Comment) (assure oral clearance achieved)    Other  Recommendations Oral Care Recommendations: Oral care BID    Recommendations for follow up therapy are one component of a multi-disciplinary discharge planning process, led by the attending physician.  Recommendations may be updated based on patient status, additional functional criteria and insurance authorization.  Follow up Recommendations    N/a    Assistance Recommended at Discharge None  Functional Status Assessment Patient has not had a recent decline in their functional status (in swallowing)  Frequency and Duration      N/a      Prognosis   N/a     Swallow Study   General Date of Onset: 04/08/21 HPI: Pt is a 65 yo female with malignant breast cancer on chemoradiation, DM2, pna, anemia/thrombocytopenia, HTN.    Pt s/p LP which was negative = concerns for encephalitis/mengitis was noted.  CXR showed patchy infiltrates.  Pt has intermittently having fevers.  Prior tobacco use.  Per RN, MD and pt's son, pt's mentation is much improved today.  She continues with delayed processing.  Swallow eval ordered. Type of Study: Bedside Swallow Evaluation Previous Swallow Assessment: none in epic Diet  Prior to this Study: Regular;Thin liquids Temperature Spikes Noted: Yes Respiratory Status: Nasal cannula History of Recent Intubation: No Behavior/Cognition: Alert;Other (Comment) (delayed responses) Oral Cavity Assessment: Dry Oral Care Completed by SLP: No Oral Cavity - Dentition:  (pt doesn't open her mouth  fully, dentition present) Vision: Impaired for self-feeding Self-Feeding Abilities: Needs assist Patient Positioning: Upright in bed Baseline Vocal Quality: Low vocal intensity Volitional Cough: Weak Volitional Swallow: Able to elicit (with delay after oral moisture)    Oral/Motor/Sensory Function Overall Oral Motor/Sensory Function: Mild impairment (generalized weakness noted) Facial Symmetry: Abnormal symmetry right Facial Strength: Within Functional Limits Facial Sensation: Within Functional Limits Lingual ROM: Within Functional Limits Velum: Within Functional Limits Mandible: Within Functional Limits   Ice Chips Ice chips: Not tested   Thin Liquid Thin Liquid: Within functional limits Presentation: Straw;Cup;Self Fed    Nectar Thick Nectar Thick Liquid: Not tested   Honey Thick Honey Thick Liquid: Not tested   Puree Puree: Not tested   Solid     Solid: Within functional limits Presentation: Self Fed Other Comments: small bolus of saltine cracker      Marie Church 04/08/2021,12:11 PM  Marie Lime, MS [facility] SLP Acute Rehab Services Office [phone] Pager [phone]

## 2021-04-08 NOTE — Progress Notes (Signed)
Neurology Progress Note  Brief HPI: 65 y.o. female with a PMHx of right breast cancer on Kudeyla and radiation, chronic systolic heart failure, HTN, type 2 DM who presented to the ED 11/13 for evaluation of SOB and AMS with treatment for sepsis or pneumonia s/p antibiotics with continuing fevers on empiric acyclovir for possible encephalitis/meningitis with initial CSF results that are unremarkable. Patient's son at bedside states that patient has been "loopy" for the past few days with much improvement in mental status today.   Subjective: No acute overnight events  Patient states she felt significantly confused yesterday but is feeling much better and more clear mentally today. She states that she feels she is at 90%.   Exam: Vitals:   04/08/21 1309 04/08/21 1503  BP: (!) 89/50 (!) 94/54  Pulse: 71 79  Resp: 14   Temp: 99.2 F (37.3 C)   SpO2: 100%    Gen: Awake, laying comfortably in bed with son at bedside, in no acute distress Resp: non-labored breathing, no respiratory distress Abd: soft, non-tender, non-distended  Neuro: Mental Status: Awake, alert, and oriented to self, age, month, year, place, and situation.  She does have some confusion that is evident in conversation (i.e. thinking [place] is one hour away by car). She also has some delayed responses to examiner questions.  Speech is slightly dysarthric from baseline per son at bedside.  She does not have any evidence of aphasia or neglect on exam.  Cranial Nerves: PERRL, EOMI without ptosis, visual fields are full, facial sensation is intact and symmetric to light touch, patient has a subtle right mouth droop (son states this was more prominent yesterday), hearing is intact to voice, shoulders shrug symmetrically, tongue protrudes midline. Motor: Patient has some pain-limited assessment including with her right shoulder with reported surgery approximately 3 months ago. Within her pain limitation, she is able to elevate  bilateral upper extremities antigravity without vertical drift.  Bilateral lower extremities also elevate antigravity without vertical drift with reports of right knee pain 2/2 arthritis. Initially patient claims that she is only able to lift her left leg but on reassessment, she lifts both equally.  Sensory: Patient reports decreased sensation to the right upper and lower extremity to light touch.  DTR: 1+ patellae, 2+ biceps Gait: Deferred  Pertinent Labs: CBC    Component Value Date/Time   WBC 4.2 04/07/2021 1519   RBC 3.33 (L) 04/07/2021 1519   HGB 8.7 (L) 04/07/2021 1519   HCT 29.4 (L) 04/07/2021 1519   PLT 88 (L) 04/07/2021 1519   MCV 88.3 04/07/2021 1519   MCH 26.1 04/07/2021 1519   MCHC 29.6 (L) 04/07/2021 1519   RDW 16.8 (H) 04/07/2021 1519   LYMPHSABS 1.5 04/07/2021 1519   MONOABS 0.6 04/07/2021 1519   EOSABS 0.2 04/07/2021 1519   BASOSABS 0.0 04/07/2021 1519   CMP     Component Value Date/Time   NA 131 (L) 04/07/2021 1519   NA 143 10/28/2020 1510   K 4.5 04/07/2021 1519   CL 96 (L) 04/07/2021 1519   CO2 32 04/07/2021 1519   GLUCOSE 93 04/07/2021 1519   BUN 9 04/07/2021 1519   BUN 15 10/28/2020 1510   CREATININE 0.63 04/07/2021 1519   CALCIUM 8.0 (L) 04/07/2021 1519   PROT 8.2 (H) 04/07/2021 1519   ALBUMIN 2.2 (L) 04/07/2021 1519   AST 52 (H) 04/07/2021 1519   ALT 20 04/07/2021 1519   ALKPHOS 73 04/07/2021 1519   BILITOT 0.5 04/07/2021 1519  GFRNONAA >60 04/07/2021 1519    Latest Reference Range & Units 04/07/21 14:28  Appearance, CSF CLEAR  CLEAR  CLEAR CLEAR  Glucose, CSF 40 - 70 mg/dL 53  RBC Count, CSF 0 /cu mm 0 /cu mm 1 (H) 9 (H)  WBC, CSF 0 - 5 /cu mm 0 - 5 /cu mm 2 2  Other Cells, CSF  DIFFERENTIAL NOT PERFORMED DUE TO LOW WBC COUNT. DIFFERENTIAL NOT PERFORMED DUE TO LOW WBC COUNT.  Color, CSF COLORLESS  COLORLESS  COLORLESS COLORLESS  Supernatant  NOT INDICATED NOT INDICATED  Total  Protein, CSF 15 - 45 mg/dL 28  Tube #  4 1    Specimen Description CSF   Special Requests NONE   Gram Stain NO WBC SEEN  NO ORGANISMS SEEN  CYTOSPIN SMEAR    Crypto Ag NEGATIVE NEGATIVE   Cryptococcal Ag Titer NOT INDICATED NOT INDICATED    HSV-1 DNA Negative Negative   HSV-2 DNA Negative Negative    Unresulted Labs (From admission, onward)     Start     Ordered   04/09/21 0500  CBC  Tomorrow morning,   R       Question:  Specimen collection method  Answer:  IV Team=IV Team collect   04/08/21 1550   04/09/21 0500  Comprehensive metabolic panel  Tomorrow morning,   R       Question:  Specimen collection method  Answer:  IV Team=IV Team collect   04/08/21 1550   04/07/21 1532  Anaerobic culture w Gram Stain  RELEASE UPON ORDERING,   STAT        04/07/21 1532   04/07/21 1532  Draw extra clot tube  RELEASE UPON ORDERING,   STAT       Question:  Specimen collection method  Answer:  IV Team=IV Team collect   04/07/21 1532   04/07/21 1532  Fungus Culture With Stain  RELEASE UPON ORDERING,   STAT        04/07/21 1532   04/07/21 1516  Culture, fungus without smear  Once,   R        04/07/21 1522   04/07/21 1516  VDRL, CSF  Once,   TIMED        04/07/21 1522   04/07/21 1516  Anaerobic culture w Gram Stain  Once,   R        04/07/21 1522   04/07/21 1516  Oligoclonal bands, CSF + serm  Once,   TIMED        04/07/21 1522   04/07/21 0933  Acid Fast Smear (AFB)  (AFB smear + Culture w reflexed sensitivities panel)  Once,   R       See Hyperspace for full Linked Orders Report.   04/07/21 0932   04/07/21 0933  Acid Fast Culture with reflexed sensitivities  (AFB smear + Culture w reflexed sensitivities panel)  Once,   R       See Hyperspace for full Linked Orders Report.   04/07/21 0932           Imaging Reviewed:  MRI brain wo contrast 11/20: 1. Markedly motion degraded study. 2. No acute intracranial abnormality.  Routine EEG 11/22: "This study is suggestive of mild diffuse encephalopathy, nonspecific etiology. No  seizures or epileptiform discharges were seen throughout the recording."  Assessment: 65 yo female with right breast cancer on chemotherapy and radiation therapy. She presented with sepsis from PNA and her fevers were attributed to this. Extensive testing  for infection has been negative thus far except for above mentioned PNA.  However, patient continued fevers and other etiologies were considered, including encephalitis or meningitis. She was started on empiric Acyclovir this am and is going for LP under fluoroscopy. CSF initial results are unremarkable, pending further CSF labs.  24 hour temperature maximum of 101.   Impression: -Persistent fevers with no response to escalated antibiotics.  -Nuchal rigidity and AMS.  -Right breast cancer.   Recommendations: - Continue to follow CSF pending labs - HSV PCR negative, can discontinue Acyclovir coverage - Continue to follow fever curve - Antibiotics per ID - Repeat MRI with right mouth droop due to motion degraded initial study with and without contrast   Anibal Henderson, AGACNP-BC [facility] [phone]  Neurology Attending Attestation   I examined the patient and discussed plan with Ms. Toberman. Acyclovir d/c'd after HSV returned negative. No indication for CNS abx coverage given no pleiocytosis and negative gram stain on CSF. Further abx mgmt per ID. 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 Patient is still altered but mental status significantly improved since yesterday. EEG showed no epileptiform abnormalities and was only c/w mild encephalopathy. Given R facial droop will repeat MRI brain wwo contrast (initial scan was noncontrast and highly motion-degraded). If no acute findings, no further neurologic workup indicated until underlying medical issues are treated and resolved.   Su Monks, MD [facility] [phone]   If 7pm- 7am, please page neurology on call as listed in [facility].

## 2021-04-08 NOTE — Progress Notes (Signed)
EEG complete - results pending 

## 2021-04-08 NOTE — Progress Notes (Signed)
Pt stated she is breathing pretty good and refused bipap tonight.  Pt was advised that RT is available all night should she change her mind.

## 2021-04-08 NOTE — Progress Notes (Signed)
PCCM Brief Progress Note  # Abnormal CT Chest - npo after midnight for bronchoscopy tomorrow afternoon (I have placed order for this) - no anticoagulation tomorrow morning (I have left current order in place since it's scheduled for the evening anyway)  Marie Church

## 2021-04-08 NOTE — Progress Notes (Addendum)
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   CT angiogram of the chest shows diffuse bilateral groundglass opacities consistent with pulmonary edema versus infectious process.  Patient was started on broad-spectrum IV antibiotics and referred to [facility] for admission for acute respiratory failure with hypoxia secondary to pneumonia in the setting of failed outpatient antibiotic treatment.  Her last CXR from 11/16 shows worsening aeration, then we changed antibiotics to vancomycin and cefepime . She remains on 2 lit of [place] oxygen . Despite broadening the coverage , pt continued to have low grade fever and high fever last night with T MAX OF 103, with lethargy. ABG showed hypercapnia, she was placed on BIPAP with some improvement in her mental status but is not back at baseline.  ID stopped cefepime due to concern for neurotoxicity. 04/08/2021 EEG suggestive of mild diffuse encephalopathy nonspecific etiology.  No seizures or epileptiform changes were noted.  Assessment & Plan:   Principal Problem:   Community acquired pneumonia Active Problems:   Anemia   Malignant neoplasm of upper-outer quadrant of right breast in female, estrogen receptor negative (HCC)   HFrEF (heart failure with reduced ejection fraction) (HCC)   Hyponatremia   Thrombocytopenia (HCC)   Acute respiratory failure (HCC)   Malnutrition of moderate degree   Acute metabolic encephalopathy   Acute respiratory failure with hypoxia, sepsis secondary to bilateral pneumonia present on  admission. Differentials include radiation pneumonitis vs metastatic disease from breast cancer.  Sputum culture no growth, AFB pending, gram stain no WBC or organism. Scheduled for bronchoscopy on Wednesday, 04/09/2021 Vancomycin and acyclovir stopped and Rocephin started on 04/08/2021 -in the setting of failed outpatient treatment for pneumonia. -Patient continues to require nasal cannula oxygen to keep sats greater than 90%. She is currently on 2 lit of [place] oxygen.  -CT angiogram of the chest showed diffuse ground glass opacities doe on 03/30/21.  Repeat CT chest on 04/07/2021 with patchy bilateral groundglass opacities and smooth interlobular septal thickening increased compared to prior exam differential includes pulmonary edema versus multifocal infection.  Trace bilateral pleural effusions and atelectasis.  Unchanged mildly enlarged mediastinal and left axillary lymph nodes likely reactive. -COVID-19 PCR, influenza PCR is negative, urine for strep pneumonia is negative,.blood cultures negative till date.  -Patient continues to have low grade fevers, at which point, broadened her antibiotics to vancomycin and cefepime. MRSA is negative. She has completed 5 days of IV cefepime. - ABG showed hypercapnia, BIPAP was used with some improvement in her mental status.  - PCCM and ID following.     Persistent fevers-with no response to escalating antibiotics.  Due to nuchal rigidity and acute change in mental status a lumbar puncture was done. LP essentially unremarkable with WBC of 2 with a glucose of 53 and protein of 28. Probably from worsening pneumonia.  WBC count within normal limits and procalcitonin is 0.18 COVID-19 PCR is negative, influenza PCR is negative, RVP is negative, urine analysis is negative for infection.  CT abdomen and pelvis without contrast is negative for any acute intra-abdominal pathology. Blood cultures to date have been negative so far. Lower extremity duplex is  negative  for acute DVT Neurology consulted and she was started on IV antibiotics empirically for meningitis. Discussed the plan with the patient and family members.   Acute toxic encephalopathy Differential include from worsening pneumonia vs meningitis/ encephalitis/hypercapnia MRI brain without contrast is negative for acute intracranial abnormality. Ammonia level is 24, TSH is within normal limits   Aspiration risk mild speech recommends thin liquid with small sips and bites.   Hyponatremia Suspect hypovolemic hyponatremia in the setting of dehydration. Continue to monitor.   Chronic systolic heart failure/history of nonischemic cardiomyopathy. Echocardiogram on 03/03/2021 showed improvement in left ventricular ejection fraction to 50% compared to the previous echo showing left ventricular ejection fraction at 20 to 25%. Continue with Coreg at 12.5 mg twice daily, Entresto 24-26 mg twice daily. Holding Lasix and spironolactone for now.       Hypotension she was given a 500 cc of normal saline bolus.  Lasix and Aldactone were on hold.  She is on Coreg and Entresto monitor closely.   Metastatic right-sided breast cancer with axillary node involvement Patient follows up with [facility] oncology, currently on active chemotherapy with Kadcyla with last treatment on 03/19/2021 Patient also undergoing radiation treatment through radiation oncology at [facility]. She has one more week of radiation treatment.  She has not received any radiation this week as the radiation machine has been not working.   Anemia/thrombocytopenia Probably secondary to chemotherapy Continue to monitor counts. Hemoglobin stable around 8 and platelet counts are improving    Nutrition Problem: Moderate Malnutrition Etiology: chronic illness, cancer and cancer related treatments     Signs/Symptoms: mild fat depletion, mild muscle depletion    Interventions: Boost Plus, MVI, Premier Protein  Estimated body mass index is 29.9  kg/m as calculated from the following:   Height as of this encounter: 5' 6.14" (1.68 m).   Weight as of this encounter: 84.4 kg.  DVT prophylaxis: Lovenox  code Status: Full code Family Communication: Discussed with son at bedside disposition Plan:  Status is: Inpatient  Remains inpatient appropriate because: Patient with acute change in mental status of unclear etiology work-up is still in progress status post lumbar puncture on IV antibiotics followed by infectious disease PCCM and neurology   Consultants:  Infectious disease, PCCM, neurology  Procedures: Lumbar puncture MRI of the brain CT of the chest and abdomen without contrast Antimicrobials: Anti-infectives (From admission, onward)    Start     Dose/Rate Route Frequency Ordered Stop   04/08/21 2000  meropenem (MERREM) 1 g in sodium chloride 0.9 % 100 mL IVPB  Status:  Discontinued        1 g 200 mL/hr over 30 Minutes Intravenous Every 8 hours 04/08/21 1347 04/08/21 1349   04/08/21 2000  cefTRIAXone (ROCEPHIN) 2 g in sodium chloride 0.9 % 100 mL IVPB        2 g 200 mL/hr over 30 Minutes Intravenous Every 24 hours 04/08/21 1349     04/08/21 1000  cefTRIAXone (ROCEPHIN) 2 g in sodium chloride 0.9 % 100 mL IVPB  Status:  Discontinued        2 g 200 mL/hr over 30 Minutes Intravenous Every 24 hours 04/07/21 0905 04/07/21 0931   04/07/21 2200  acyclovir (ZOVIRAX) 800 mg in dextrose 5 % 150 mL IVPB  Status:  Discontinued        800 mg 166 mL/hr over 60 Minutes Intravenous Every 8 hours 04/07/21 1451 04/08/21 1341   04/07/21 2000  meropenem (MERREM) 2 g in sodium  chloride 0.9 % 100 mL IVPB  Status:  Discontinued        2 g 200 mL/hr over 30 Minutes Intravenous Every 8 hours 04/07/21 1451 04/08/21 1347   04/07/21 1200  acyclovir (ZOVIRAX) 800 mg in dextrose 5 % 150 mL IVPB  Status:  Discontinued        800 mg 166 mL/hr over 60 Minutes Intravenous Every 8 hours 04/07/21 1037 04/07/21 1451   04/07/21 1100  vancomycin (VANCOREADY)  IVPB 1000 mg/200 mL  Status:  Discontinued        1,000 mg 200 mL/hr over 60 Minutes Intravenous Every 12 hours 04/07/21 1037 04/08/21 1340   04/07/21 1030  meropenem (MERREM) 2 g in sodium chloride 0.9 % 100 mL IVPB  Status:  Discontinued        2 g 200 mL/hr over 30 Minutes Intravenous Every 8 hours 04/07/21 1010 04/07/21 1451   04/07/21 1000  cefTRIAXone (ROCEPHIN) 2 g in sodium chloride 0.9 % 100 mL IVPB  Status:  Discontinued        2 g 200 mL/hr over 30 Minutes Intravenous Every 12 hours 04/07/21 0931 04/07/21 0955   04/03/21 2200  vancomycin (VANCOREADY) IVPB 1750 mg/350 mL  Status:  Discontinued        1,750 mg 175 mL/hr over 120 Minutes Intravenous Every 24 hours 04/03/21 0850 04/04/21 0951   04/03/21 1000  vancomycin (VANCOREADY) IVPB 1500 mg/300 mL        1,500 mg 150 mL/hr over 120 Minutes Intravenous  Once 04/03/21 0850 04/03/21 1416   04/03/21 0900  ceFEPIme (MAXIPIME) 2 g in sodium chloride 0.9 % 100 mL IVPB  Status:  Discontinued        2 g 200 mL/hr over 30 Minutes Intravenous Every 8 hours 04/03/21 0850 04/07/21 0857   03/31/21 0200  cefTRIAXone (ROCEPHIN) 2 g in sodium chloride 0.9 % 100 mL IVPB  Status:  Discontinued        2 g 200 mL/hr over 30 Minutes Intravenous Every 24 hours 03/30/21 2051 04/03/21 0823   03/30/21 1630  azithromycin (ZITHROMAX) 500 mg in sodium chloride 0.9 % 250 mL IVPB  Status:  Discontinued        500 mg 250 mL/hr over 60 Minutes Intravenous Every 24 hours 03/30/21 1619 04/03/21 0823   03/30/21 1630  ceFEPIme (MAXIPIME) 2 g in sodium chloride 0.9 % 100 mL IVPB        2 g 200 mL/hr over 30 Minutes Intravenous  Once 03/30/21 1625 03/30/21 1724        Subjective: At my first visit to see her she was drowsy but she was able to answer questions and tell me she is in [facility] and I corrected her it was [facility] and then she nodded her head and said yes [facility] she tells me Barbette Or is the president. Overnight  events noted patient was placed on CPAP as she was more drowsy after being on CPAP for few hours her mental status improved Later on when I saw her her son was in the room speech therapist was also working with her was concerned about maybe she had a facial droop but was unclear she appeared drowsy and sleepy still eyes open and able to answer questions Her son feels that she is not better at all  Objective: Vitals:   04/08/21 0403 04/08/21 0550 04/08/21 1309 04/08/21 1503  BP: 128/65  (!) 89/50 (!) 94/54  Pulse: 87  71 79  Resp: (!) 22  14   Temp: 99.3 F (37.4 C)  99.2 F (37.3 C)   TempSrc: Oral  Oral   SpO2: 100%  100%   Weight:  84.4 kg    Height:        Intake/Output Summary (Last 24 hours) at 04/08/2021 1529 Last data filed at 04/08/2021 1517 Gross per 24 hour  Intake 1596.21 ml  Output 850 ml  Net 746.21 ml   Filed Weights   04/04/21 0500 04/07/21 0500 04/08/21 0550  Weight: 76.7 kg 80.8 kg 84.4 kg    Examination:  General exam: Appears drowsy sleepy easily arousable and responds appropriately to at times to commands Respiratory system: Clear to auscultation. Respiratory effort normal. Cardiovascular system: S1 & S2 heard, RRR. No JVD, murmurs, rubs, gallops or clicks. No pedal edema. Gastrointestinal system: Abdomen is nondistended, soft and nontender. No organomegaly or masses felt. Normal bowel sounds heard. Central nervous system:  drowsy, moves all extremities extremities: Trace edema Skin: No rashes, lesions or ulcers Psychiatry: Unable to assess  Data Reviewed: I have personally reviewed following labs and imaging studies  CBC: Recent Labs  Lab 04/03/21 0502 04/04/21 0359 04/05/21 0459 04/06/21 0009 04/07/21 1519  WBC 6.1 6.1 4.5 4.3 4.2  NEUTROABS  --   --   --   --  1.9  HGB 8.8* 8.9* 8.7* 8.2* 8.7*  HCT 28.7* 29.5* 28.6* 27.3* 29.4*  MCV 86.4 86.5 87.2 86.1 88.3  PLT 118* 126* 120* 114* 88*   Basic Metabolic Panel: Recent Labs  Lab  04/02/21 0527 04/04/21 0359 04/05/21 0459 04/06/21 0009 04/07/21 0100 04/07/21 1519  NA 132* 132* 135 131*  --  131*  K 4.3 3.9 3.9 4.0  --  4.5  CL 99 99 100 95*  --  96*  CO2 28 31 32 33*  --  32  GLUCOSE 102* 110* 102* 111*  --  93  BUN 8 7* 9 7*  --  9  CREATININE 0.55 0.52 0.51 0.49 0.76 0.63  CALCIUM 8.5* 8.0* 8.4* 8.1*  --  8.0*  MG 2.1  --   --   --   --   --    GFR: Estimated Creatinine Clearance: 79 mL/min (by C-G formula based on SCr of 0.63 mg/dL). Liver Function Tests: Recent Labs  Lab 04/07/21 1519  AST 52*  ALT 20  ALKPHOS 73  BILITOT 0.5  PROT 8.2*  ALBUMIN 2.2*   No results for input(s): LIPASE, AMYLASE in the last 168 hours. Recent Labs  Lab 04/05/21 1524  AMMONIA 24   Coagulation Profile: No results for input(s): INR, PROTIME in the last 168 hours. Cardiac Enzymes: No results for input(s): CKTOTAL, CKMB, CKMBINDEX, TROPONINI in the last 168 hours. BNP (last 3 results) No results for input(s): PROBNP in the last 8760 hours. HbA1C: No results for input(s): HGBA1C in the last 72 hours. CBG: No results for input(s): GLUCAP in the last 168 hours. Lipid Profile: No results for input(s): CHOL, HDL, LDLCALC, TRIG, CHOLHDL, LDLDIRECT in the last 72 hours. Thyroid Function Tests: No results for input(s): TSH, T4TOTAL, FREET4, T3FREE, THYROIDAB in the last 72 hours. Anemia Panel: No results for input(s): VITAMINB12, FOLATE, FERRITIN, TIBC, IRON, RETICCTPCT in the last 72 hours. Sepsis Labs: Recent Labs  Lab 04/05/21 1524  PROCALCITON 0.18    Recent Results (from the past 240 hour(s))  Resp Panel by RT-PCR (Flu A&B, Covid) Nasopharyngeal Swab     Status: None  Collection Time: 03/30/21  2:39 PM   Specimen: Nasopharyngeal Swab; Nasopharyngeal(NP) swabs in vial transport medium  Result Value Ref Range Status   SARS Coronavirus 2 by RT PCR NEGATIVE NEGATIVE Final    Comment: (NOTE) SARS-CoV-2 target nucleic acids are NOT DETECTED.  The  SARS-CoV-2 RNA is generally detectable in upper respiratory specimens during the acute phase of infection. The lowest concentration of SARS-CoV-2 viral copies this assay can detect is 138 copies/mL. A negative result does not preclude SARS-Cov-2 infection and should not be used as the sole basis for treatment or other patient management decisions. A negative result may occur with  improper specimen collection/handling, submission of specimen other than nasopharyngeal swab, presence of viral mutation(s) within the areas targeted by this assay, and inadequate number of viral copies(<138 copies/mL). A negative result must be combined with clinical observations, patient history, and epidemiological information. The expected result is Negative.  Fact Sheet for Patients:  EntrepreneurPulse.com.au  Fact Sheet for Healthcare Providers:  IncredibleEmployment.be  This test is no t yet approved or cleared by the [place] FDA and  has been authorized for detection and/or diagnosis of SARS-CoV-2 by FDA under an Emergency Use Authorization (EUA). This EUA will remain  in effect (meaning this test can be used) for the duration of the COVID-19 declaration under Section 564(b)(1) of the Act, 21 U.S.C.section 360bbb-3(b)(1), unless the authorization is terminated  or revoked sooner.       Influenza A by PCR NEGATIVE NEGATIVE Final   Influenza B by PCR NEGATIVE NEGATIVE Final    Comment: (NOTE) The Xpert Xpress SARS-CoV-2/FLU/RSV plus assay is intended as an aid in the diagnosis of influenza from Nasopharyngeal swab specimens and should not be used as a sole basis for treatment. Nasal washings and aspirates are unacceptable for Xpert Xpress SARS-CoV-2/FLU/RSV testing.  Fact Sheet for Patients: EntrepreneurPulse.com.au  Fact Sheet for Healthcare Providers: IncredibleEmployment.be  This test is not yet approved or  cleared by the [place] FDA and has been authorized for detection and/or diagnosis of SARS-CoV-2 by FDA under an Emergency Use Authorization (EUA). This EUA will remain in effect (meaning this test can be used) for the duration of the COVID-19 declaration under Section 564(b)(1) of the Act, 21 U.S.C. section 360bbb-3(b)(1), unless the authorization is terminated or revoked.  Performed at [facility], [place] [address], [place], [place] [address]   Culture, blood (Routine x 2)     Status: None   Collection Time: 03/30/21  3:20 PM   Specimen: BLOOD  Result Value Ref Range Status   Specimen Description   Final    BLOOD PORTA CATH Performed at [place] [address] [place], [place] [address]    Special Requests   Final    BOTTLES DRAWN AEROBIC AND ANAEROBIC Blood Culture adequate volume Performed at [place] [address], [place], [place] [address]    Culture   Final    NO GROWTH 5 DAYS Performed at [facility], [place] [address], [place], [place] [address]    Report Status 04/04/2021 FINAL  Final  Culture, blood (Routine x 2)     Status: None   Collection Time: 03/30/21  3:27 PM   Specimen: BLOOD  Result Value Ref Range Status   Specimen Description   Final    BLOOD LEFT ANTECUBITAL Performed at [place] [address] [place],  [address]    Special Requests   Final  BOTTLES DRAWN AEROBIC AND ANAEROBIC Blood Culture results may not be optimal due to an excessive volume of blood received in culture bottles Performed at [facility], [place] [address], [place], [place] [address]    Culture   Final    NO GROWTH 5 DAYS Performed at [facility], [place] [address] [place], [place] [address]    Report Status 04/04/2021 FINAL  Final  Culture, blood (Routine X 2) w Reflex to ID Panel     Status: None   Collection Time: 04/02/21  6:26 PM   Specimen:  BLOOD  Result Value Ref Range Status   Specimen Description   Final    BLOOD BLOOD LEFT HAND Performed at [place] [address] [place], [place] [address]    Special Requests   Final    BOTTLES DRAWN AEROBIC ONLY Blood Culture adequate volume Performed at [place] [address] [place], [place] [address]    Culture   Final    NO GROWTH 5 DAYS Performed at [facility], [place] [address], [place], [place] [address]    Report Status 04/07/2021 FINAL  Final  Culture, blood (Routine X 2) w Reflex to ID Panel     Status: None   Collection Time: 04/02/21  7:43 PM   Specimen: BLOOD LEFT WRIST  Result Value Ref Range Status   Specimen Description   Final    BLOOD LEFT WRIST Performed at [place] [address], [place], [place] [address]    Special Requests   Final    BOTTLES DRAWN AEROBIC ONLY Blood Culture adequate volume Performed at [place] [address] [place], [place] [address]    Culture   Final    NO GROWTH 5 DAYS Performed at [facility], [place] [address], [place], [place] [address]    Report Status 04/07/2021 FINAL  Final  MRSA Next Gen by PCR, Nasal     Status: None   Collection Time: 04/03/21 11:26 AM   Specimen: Nasal Mucosa; Nasal Swab  Result Value Ref Range Status   MRSA by PCR Next Gen NOT DETECTED NOT DETECTED Final    Comment: (NOTE) The GeneXpert MRSA Assay (FDA approved for NASAL specimens only), is one component of a comprehensive MRSA colonization surveillance program. It is not intended to diagnose MRSA infection nor to guide or monitor treatment for MRSA infections. Test performance is not FDA approved in patients less than 20 years old. Performed at [facility], [place] [address], [place], [place] [address]   Culture, blood (routine x 2)     Status: None (Preliminary result)   Collection Time: 04/05/21  3:24 PM   Specimen:  BLOOD  Result Value Ref Range Status   Specimen Description   Final    BLOOD BLOOD LEFT HAND Performed at [place] [address] [place], [place] [address]    Special Requests   Final    BOTTLES DRAWN AEROBIC ONLY Blood Culture adequate volume Performed at [place] [address] [place], [place] [address]    Culture   Final    NO GROWTH 3 DAYS Performed at [facility], [place] [address], [place], [place] [address]    Report Status PENDING  Incomplete  Culture, blood (routine x 2)     Status: None (Preliminary result)   Collection Time: 04/05/21  3:24 PM   Specimen:  BLOOD  Result Value Ref Range Status   Specimen Description   Final    BLOOD BLOOD LEFT WRIST Performed at [facility] [address] [place], [place] [address]    Special Requests   Final    BOTTLES DRAWN AEROBIC ONLY Blood Culture adequate volume Performed at [place] [address], [place], [place] [address]    Culture   Final    NO GROWTH 3 DAYS Performed at [facility], [place] [address], [place], [place] [address]    Report Status PENDING  Incomplete  Expectorated Sputum Assessment w Gram Stain, Rflx to Resp Cult     Status: None   Collection Time: 04/06/21 10:00 AM   Specimen: Expectorated Sputum  Result Value Ref Range Status   Specimen Description EXPECTORATED SPUTUM  Final   Special Requests NONE  Final   Sputum evaluation   Final    THIS SPECIMEN IS ACCEPTABLE FOR SPUTUM CULTURE Performed at [facility], [place] [address], [place], [place] [address]    Report Status 04/06/2021 FINAL  Final  Culture, Respiratory w Gram Stain     Status: None   Collection Time: 04/06/21 10:00 AM  Result Value Ref Range Status   Specimen Description   Final    EXPECTORATED SPUTUM Performed at [facility], [name] [address] [place], [place] [address]    Special Requests    Final    NONE Reflexed from [id number] Performed at [facility], [place] [address], [place], [place] [address]    Gram Stain   Final    FEW SQUAMOUS EPITHELIAL CELLS PRESENT FEW WBC SEEN FEW YEAST MODERATE GRAM POSITIVE RODS MODERATE GRAM POSITIVE COCCI    Culture   Final    ABUNDANT Normal respiratory flora-no Staph aureus or Pseudomonas seen Performed at [facility], [address], [place], [place] [address]    Report Status 04/08/2021 FINAL  Final  Respiratory (~20 pathogens) panel by PCR     Status: None   Collection Time: 04/07/21  9:29 AM   Specimen: Nasopharyngeal Swab; Respiratory  Result Value Ref Range Status   Adenovirus NOT DETECTED NOT DETECTED Final   Coronavirus 229E NOT DETECTED NOT DETECTED Final    Comment: (NOTE) The Coronavirus on the Respiratory Panel, DOES NOT test for the novel  Coronavirus (2019 nCoV)    Coronavirus HKU1 NOT DETECTED NOT DETECTED Final   Coronavirus NL63 NOT DETECTED NOT DETECTED Final   Coronavirus OC43 NOT DETECTED NOT DETECTED Final   Metapneumovirus NOT DETECTED NOT DETECTED Final   Rhinovirus / Enterovirus NOT DETECTED NOT DETECTED Final   Influenza A NOT DETECTED NOT DETECTED Final   Influenza B NOT DETECTED NOT DETECTED Final   Parainfluenza Virus 1 NOT DETECTED NOT DETECTED Final   Parainfluenza Virus 2 NOT DETECTED NOT DETECTED Final   Parainfluenza Virus 3 NOT DETECTED NOT DETECTED Final   Parainfluenza Virus 4 NOT DETECTED NOT DETECTED Final   Respiratory Syncytial Virus NOT DETECTED NOT DETECTED Final   Bordetella pertussis NOT DETECTED NOT DETECTED Final   Bordetella Parapertussis NOT DETECTED NOT DETECTED Final   Chlamydophila pneumoniae NOT DETECTED NOT DETECTED Final   Mycoplasma pneumoniae NOT DETECTED NOT DETECTED Final    Comment: Performed at [facility], [place]. [address] [place],  [address]  CSF culture w Stat Gram Stain     Status: None (Preliminary result)   Collection Time:  04/07/21  2:28  PM   Specimen: CSF; Cerebrospinal Fluid  Result Value Ref Range Status   Specimen Description CSF  Final   Special Requests NONE  Final   Gram Stain   Final    NO WBC SEEN NO ORGANISMS SEEN CYTOSPIN SMEAR Gram Stain Report Called to,Read Back By and Verified With: R.MICHAELS, RN AT 1850 ON 11.21.22 BY N.THOMPSON Performed at [place] [address] [place], [place] [address]    Culture PENDING  Incomplete   Report Status PENDING  Incomplete         Radiology Studies: CT ABDOMEN PELVIS WO CONTRAST  Result Date: 04/06/2021 CLINICAL DATA:  Abdominal pain and fever EXAM: CT ABDOMEN AND PELVIS WITHOUT CONTRAST TECHNIQUE: Multidetector CT imaging of the abdomen and pelvis was performed following the standard protocol without IV contrast. COMPARISON:  03/28/2021 FINDINGS: LOWER CHEST: Severe bilateral multifocal airspace disease has worsened HEPATOBILIARY: Normal hepatic contours. No intra- or extrahepatic biliary dilatation. The gallbladder is normal. PANCREAS: Normal pancreas. No ductal dilatation or peripancreatic fluid collection. SPLEEN: Normal. ADRENALS/URINARY TRACT: The adrenal glands are normal. No hydronephrosis, nephroureterolithiasis or solid renal mass. The urinary bladder is normal for degree of distention STOMACH/BOWEL: There is no hiatal hernia. Normal duodenal course and caliber. No small bowel dilatation or inflammation. No focal colonic abnormality. Normal appendix. VASCULAR/LYMPHATIC: Normal course and caliber of the major abdominal vessels. No abdominal or pelvic lymphadenopathy. REPRODUCTIVE: Normal uterus. No adnexal mass. MUSCULOSKELETAL. No bony spinal canal stenosis or focal osseous abnormality. OTHER: Post treatment changes of the right breast. IMPRESSION: 1. No acute abnormality of the abdomen or pelvis. 2. Severe bilateral multifocal pulmonary disease, worsened. This may indicate infection. Electronically Signed   By: Ulyses Jarred  M.D.   On: 04/06/2021 22:40   CT CHEST WO CONTRAST  Result Date: 04/08/2021 CLINICAL DATA:  Chest pain EXAM: CT CHEST WITHOUT CONTRAST TECHNIQUE: Multidetector CT imaging of the chest was performed following the standard protocol without IV contrast. COMPARISON:  CT chest dated March 30, 2021 FINDINGS: Cardiovascular: Cardiomegaly. No pericardial effusion. No significant coronary artery calcifications. Atherosclerotic disease of the thoracic aorta. Left chest wall port with tip near the superior cavoatrial junction. Mediastinum/Nodes: Mildly enlarged left axillary lymph node measuring 1.2 cm in short axis, unchanged compared to prior exam. Unchanged subcarinal lymph node measuring 1.3 cm in short axis on series 2, image 61. Lungs/Pleura: Central airways are patent. Bilateral patchy ground-glass opacities, increased compared to prior exam. Smooth interlobular septal thickening. Trace bilateral pleural effusions and atelectasis. Upper Abdomen: No acute abnormality. Musculoskeletal: No chest wall mass or suspicious bone lesions identified. IMPRESSION: 1. Patchy bilateral ground-glass opacities and smooth interlobular septal thickening, increased compared to prior exam, differential includes pulmonary edema versus multifocal infection. 2. Trace bilateral pleural effusions and atelectasis. 3. Unchanged mildly enlarged mediastinal and left axillary lymph nodes, likely reactive. 4.  Aortic Atherosclerosis (ICD10-I70.0). Electronically Signed   By: Yetta Glassman M.D.   On: 04/08/2021 08:17   MR BRAIN WO CONTRAST  Result Date: 04/06/2021 CLINICAL DATA:  Encephalopathy and fever of uncertain origin EXAM: MRI HEAD WITHOUT CONTRAST TECHNIQUE: Multiplanar, multiecho pulse sequences of the brain and surrounding structures were obtained without intravenous contrast. COMPARISON:  None. FINDINGS: Markedly motion degraded study. Brain: No acute infarct, mass effect or extra-axial collection. No acute or chronic  hemorrhage. Normal white matter signal, parenchymal volume and CSF spaces. The midline structures are normal. Vascular: Major flow voids are preserved. Skull and upper cervical spine: Normal calvarium and skull base. Visualized  upper cervical spine and soft tissues are normal. Sinuses/Orbits:No paranasal sinus fluid levels or advanced mucosal thickening. No mastoid or middle ear effusion. Normal orbits. IMPRESSION: 1. Markedly motion degraded study. 2. No acute intracranial abnormality. Electronically Signed   By: Ulyses Jarred M.D.   On: 04/06/2021 22:16   EEG adult  Result Date: 04/08/2021 Lora Havens, MD     04/08/2021  1:50 PM Patient Name: Nadalee Neiswender MRN: [id number] Epilepsy Attending: Lora Havens Referring Physician/Provider: Gardiner Barefoot, NP Date: 04/08/2021 Duration: 24.15 mins Patient history: 65 year old female with right breast cancer on chemotherapy who presented with altered mental status.  EEG to evaluate for seizure. Level of alertness: Awake AEDs during EEG study: None Technical aspects: This EEG study was done with scalp electrodes positioned according to the 10-20 International system of electrode placement. Electrical activity was acquired at a sampling rate of 500Hz  and reviewed with a high frequency filter of 70Hz  and a low frequency filter of 1Hz . EEG data were recorded continuously and digitally stored. Description: The posterior dominant rhythm consists of 8Hz  activity of moderate voltage (25-35 uV) seen predominantly in posterior head regions, symmetric and reactive to eye opening and eye closing. EEG showed intermittent generalized polymorphic 3 to 6 Hz theta-delta slowing, at times with triphasic morphology. Hyperventilation and photic stimulation were not performed.   ABNORMALITY - Intermittent slow, generalized IMPRESSION: This study is suggestive of mild diffuse encephalopathy, nonspecific etiology. No seizures or epileptiform discharges were seen  throughout the recording. Priyanka O Yadav   VAS [place] LOWER EXTREMITY VENOUS (DVT)  Result Date: 04/07/2021  Lower Venous DVT Study Patient Name:  AIYANA STEGMANN  Date of Exam:   04/07/2021 Medical Rec #: [id number]         Accession #:    [id number] Date of Birth: 25-Aug-1955         Patient Gender: F Patient Age:   89 years Exam Location:  [facility] Procedure:      VAS [place] LOWER EXTREMITY VENOUS (DVT) Referring Phys: Hosie Poisson --------------------------------------------------------------------------------  Indications: Edema.  Risk Factors: CA patient on chemotherapy. Comparison Study: No previous exams Performing Technologist: Jody Hill RVT, RDMS  Examination Guidelines: A complete evaluation includes B-mode imaging, spectral Doppler, color Doppler, and power Doppler as needed of all accessible portions of each vessel. Bilateral testing is considered an integral part of a complete examination. Limited examinations for reoccurring indications may be performed as noted. The reflux portion of the exam is performed with the patient in reverse Trendelenburg.  +---------+---------------+---------+-----------+----------+--------------+ RIGHT    CompressibilityPhasicitySpontaneityPropertiesThrombus Aging +---------+---------------+---------+-----------+----------+--------------+ CFV      Full           Yes      Yes                                 +---------+---------------+---------+-----------+----------+--------------+ SFJ      Full                                                        +---------+---------------+---------+-----------+----------+--------------+ FV Prox  Full           Yes      Yes                                 +---------+---------------+---------+-----------+----------+--------------+  FV Mid   Full           Yes      Yes                                 +---------+---------------+---------+-----------+----------+--------------+ FV DistalFull            Yes      Yes                                 +---------+---------------+---------+-----------+----------+--------------+ PFV      Full                                                        +---------+---------------+---------+-----------+----------+--------------+ POP      Full           Yes      Yes                                 +---------+---------------+---------+-----------+----------+--------------+ PTV      Full                                                        +---------+---------------+---------+-----------+----------+--------------+ PERO     Full                                                        +---------+---------------+---------+-----------+----------+--------------+   +---------+---------------+---------+-----------+----------+--------------+ LEFT     CompressibilityPhasicitySpontaneityPropertiesThrombus Aging +---------+---------------+---------+-----------+----------+--------------+ CFV      Full           Yes      Yes                                 +---------+---------------+---------+-----------+----------+--------------+ SFJ      Full                                                        +---------+---------------+---------+-----------+----------+--------------+ FV Prox  Full           Yes      Yes                                 +---------+---------------+---------+-----------+----------+--------------+ FV Mid   Full           Yes      Yes                                 +---------+---------------+---------+-----------+----------+--------------+ FV DistalFull  Yes      Yes                                 +---------+---------------+---------+-----------+----------+--------------+ PFV      Full                                                        +---------+---------------+---------+-----------+----------+--------------+ POP      Full           Yes      Yes                                  +---------+---------------+---------+-----------+----------+--------------+ PTV      Full                                                        +---------+---------------+---------+-----------+----------+--------------+ PERO     Full                                                        +---------+---------------+---------+-----------+----------+--------------+     Summary: BILATERAL: - No evidence of deep vein thrombosis seen in the lower extremities, bilaterally. - No evidence of superficial venous thrombosis in the lower extremities, bilaterally. - RIGHT: - A cystic structure is found in the popliteal fossa.  LEFT: - No cystic structure found in the popliteal fossa.  *See table(s) above for measurements and observations. Electronically signed by Servando Snare MD on 04/07/2021 at 4:10:01 PM.    Final    DG FLUORO GUIDE LUMBAR PUNCTURE  Result Date: 04/07/2021 CLINICAL DATA:  Altered mental status. Clinical need for CSF analysis. EXAM: DIAGNOSTIC LUMBAR PUNCTURE UNDER FLUOROSCOPIC GUIDANCE COMPARISON:  MRI of the brain and abdominopelvic CT 04/06/2021. FLUOROSCOPY TIME:  Fluoroscopy Time: 12 seconds of low-dose pulsed fluoroscopy Radiation Exposure Index (if provided by the fluoroscopic device): 0.9 mGy Number of Acquired Spot Images: 0 PROCEDURE: Standard time-out was employed. I discussed the risks (including hemorrhage, infection, headache, and nerve damage, among others), benefits, and alternatives to fluoroscopically guided lumbar puncture with the patient's daughter. We specifically discussed the high technical likelihood of success of the procedure. The patient's daughter understood and elected to undergo the procedure. An appropriate site for lumbar puncture was marked on the patient's skin under fluoroscopic guidance. Following sterile skin prep and local anesthetic administration consisting of 1 percent lidocaine, a 22 gauge spinal needle was advanced without difficulty into the  thecal sac at the at the L2-3 level under fluoroscopic guidance. Clear CSF was returned. Opening pressure was 14 cm of water measured prone through the needle. 10.5 cc of clear CSF was collected. The needle was subsequently removed and the skin cleansed and bandaged. No immediate complications were observed. IMPRESSION: Diagnostic lumbar puncture performed without complication. Electronically Signed   By: Richardean Sale M.D.   On: 04/07/2021 15:42  Scheduled Meds:  carvedilol  12.5 mg Oral BID WC   Chlorhexidine Gluconate Cloth  6 each Topical Daily   enoxaparin (LOVENOX) injection  40 mg Subcutaneous Q24H   lactose free nutrition  237 mL Oral TID WC   mouth rinse  15 mL Mouth Rinse BID   melatonin  3 mg Oral QHS   multivitamin with minerals  1 tablet Oral Daily   Ensure Max Protein  11 oz Oral BID   sacubitril-valsartan  1 tablet Oral BID   sodium chloride flush  3 mL Intravenous Q12H   Continuous Infusions:  cefTRIAXone (ROCEPHIN)  IV       LOS: 8 days    Georgette Shell, MD  04/08/2021, 3:29 PM

## 2021-04-09 ENCOUNTER — Inpatient Hospital Stay (HOSPITAL_COMMUNITY): Payer: Self-pay

## 2021-04-09 ENCOUNTER — Ambulatory Visit: Payer: Self-pay

## 2021-04-09 ENCOUNTER — Inpatient Hospital Stay (HOSPITAL_COMMUNITY): Payer: Self-pay | Admitting: Anesthesiology

## 2021-04-09 ENCOUNTER — Encounter (HOSPITAL_COMMUNITY)
Admission: EM | Disposition: A | Discharge status: Home Health | Payer: Self-pay | Source: Home / Self Care | Attending: Internal Medicine

## 2021-04-09 ENCOUNTER — Encounter (HOSPITAL_COMMUNITY): Payer: Self-pay | Admitting: Internal Medicine

## 2021-04-09 DIAGNOSIS — R590 Localized enlarged lymph nodes: Secondary | ICD-10-CM

## 2021-04-09 HISTORY — PX: ENDOBRONCHIAL ULTRASOUND: SHX5096

## 2021-04-09 HISTORY — PX: BRONCHIAL WASHINGS: SHX5105

## 2021-04-09 HISTORY — PX: BRONCHIAL NEEDLE ASPIRATION BIOPSY: SHX5106

## 2021-04-09 HISTORY — PX: BRONCHIAL BIOPSY: SHX5109

## 2021-04-09 HISTORY — PX: HEMOSTASIS CONTROL: SHX6838

## 2021-04-09 HISTORY — PX: VIDEO BRONCHOSCOPY: SHX5072

## 2021-04-09 LAB — CBC
HCT: 26.9 % — ABNORMAL LOW (ref 36.0–46.0)
Hemoglobin: 8 g/dL — ABNORMAL LOW (ref 12.0–15.0)
MCH: 26.1 pg (ref 26.0–34.0)
MCHC: 29.7 g/dL — ABNORMAL LOW (ref 30.0–36.0)
MCV: 87.6 fL (ref 80.0–100.0)
Platelets: 73 10*3/uL — ABNORMAL LOW (ref 150–400)
RBC: 3.07 MIL/uL — ABNORMAL LOW (ref 3.87–5.11)
RDW: 17 % — ABNORMAL HIGH (ref 11.5–15.5)
WBC: 3.9 10*3/uL — ABNORMAL LOW (ref 4.0–10.5)
nRBC: 0 % (ref 0.0–0.2)

## 2021-04-09 LAB — COMPREHENSIVE METABOLIC PANEL
ALT: 24 U/L (ref 0–44)
AST: 65 U/L — ABNORMAL HIGH (ref 15–41)
Albumin: 2 g/dL — ABNORMAL LOW (ref 3.5–5.0)
Alkaline Phosphatase: 96 U/L (ref 38–126)
Anion gap: 3 — ABNORMAL LOW (ref 5–15)
BUN: 10 mg/dL (ref 8–23)
CO2: 34 mmol/L — ABNORMAL HIGH (ref 22–32)
Calcium: 8 mg/dL — ABNORMAL LOW (ref 8.9–10.3)
Chloride: 99 mmol/L (ref 98–111)
Creatinine, Ser: 0.59 mg/dL (ref 0.44–1.00)
GFR, Estimated: >60 mL/min (ref >60)
Glucose, Bld: 89 mg/dL (ref 70–99)
Potassium: 3.8 mmol/L (ref 3.5–5.1)
Sodium: 136 mmol/L (ref 135–145)
Total Bilirubin: 0.2 mg/dL — ABNORMAL LOW (ref 0.3–1.2)
Total Protein: 8.1 g/dL (ref 6.5–8.1)

## 2021-04-09 LAB — BODY FLUID CELL COUNT WITH DIFFERENTIAL
Eos, Fluid: 15 %
Lymphs, Fluid: 80 %
Monocyte-Macrophage-Serous Fluid: 4 % — ABNORMAL LOW (ref 50–90)
Neutrophil Count, Fluid: 1 % (ref 0–25)
Total Nucleated Cell Count, Fluid: 97 cu mm (ref 0–1000)

## 2021-04-09 LAB — TYPE AND SCREEN
ABO/RH(D): O POS
Antibody Screen: NEGATIVE

## 2021-04-09 LAB — VDRL, CSF: VDRL Quant, CSF: NONREACTIVE

## 2021-04-09 LAB — ABO/RH: ABO/RH(D): O POS

## 2021-04-09 LAB — CYTOLOGY - NON PAP

## 2021-04-09 IMAGING — DX DG CHEST 1V PORT
1 series · 1 of 1 positions shown · non-contrast
Comparison: [DATE]

CLINICAL DATA: Status post bronchoscopy, previous abnormal chest
x-ray

EXAM:
PORTABLE CHEST 1 VIEW

[chest ap]
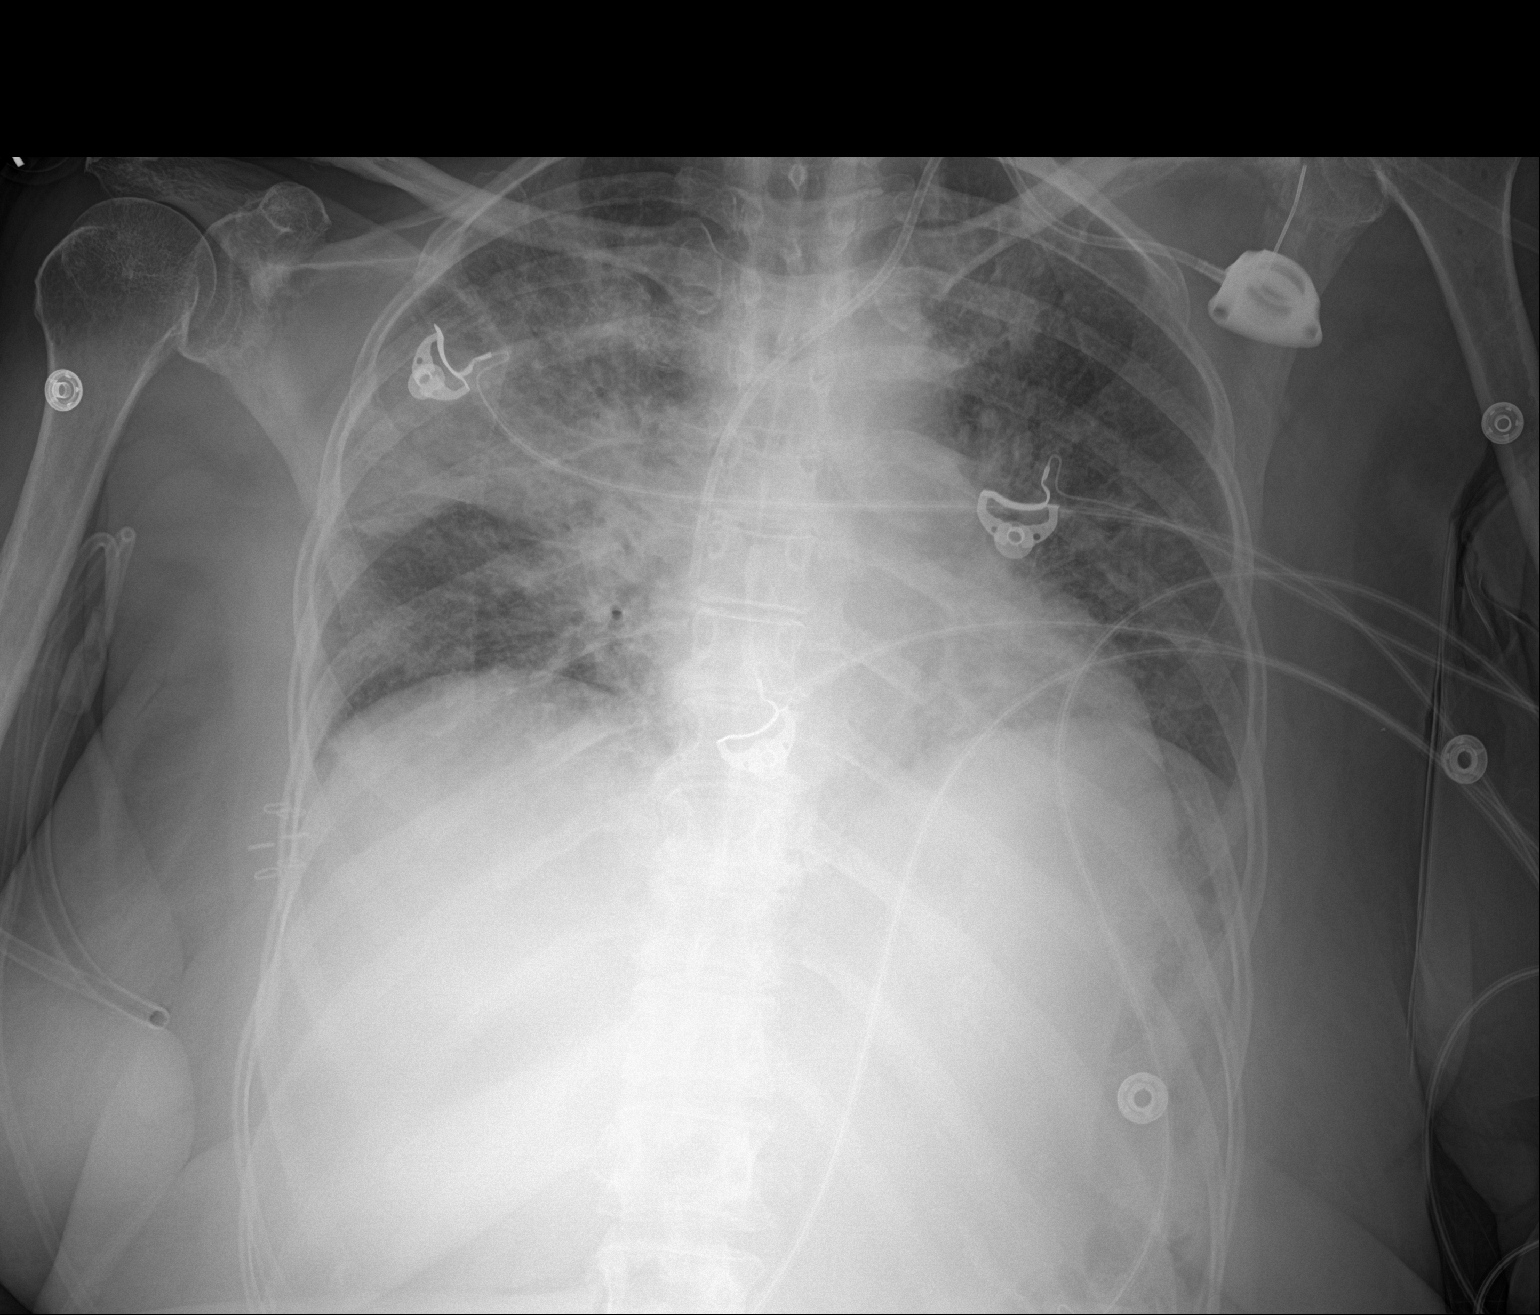

[1 of 1 positions shown; findings below may reference images not displayed]

FINDINGS: Single frontal view of the chest demonstrates stable left chest wall
port. Cardiac silhouette is unremarkable. Progression of the
consolidation within the right upper lobe, likely representing post
bronchoscopy changes. This could reflect edema or hemorrhage.

The multifocal ground-glass airspace disease seen elsewhere
throughout the lungs is stable. No large effusion or pneumothorax.
No acute bony abnormalities.
IMPRESSION: 1. Persistent bilateral ground-glass airspace disease consistent
with edema or infection. Increased density in the right upper lobe
likely represents post bronchoscopy changes, either lavage fluid or
hemorrhage.
2. No evidence of pneumothorax.

## 2021-04-09 SURGERY — BRONCHOSCOPY, WITH FLUOROSCOPY
Anesthesia: General

## 2021-04-09 MED ORDER — SODIUM CHLORIDE (PF) 0.9 % IJ SOLN
PREFILLED_SYRINGE | INTRAMUSCULAR | Status: DC | PRN
  Administered 2021-04-09 (×3): 2 mL

## 2021-04-09 MED ORDER — PROPOFOL 10 MG/ML IV BOLUS
INTRAVENOUS | Status: DC | PRN
  Administered 2021-04-09: 120 mg via INTRAVENOUS

## 2021-04-09 MED ORDER — PHENYLEPHRINE HCL-NACL 20-0.9 MG/250ML-% IV SOLN
INTRAVENOUS | Status: DC | PRN
  Administered 2021-04-09: 40 ug/min via INTRAVENOUS

## 2021-04-09 MED ORDER — PHENYLEPHRINE 40 MCG/ML (10ML) SYRINGE FOR IV PUSH (FOR BLOOD PRESSURE SUPPORT)
PREFILLED_SYRINGE | INTRAVENOUS | Status: DC | PRN
  Administered 2021-04-09: 80 ug via INTRAVENOUS
  Administered 2021-04-09 (×3): 120 ug via INTRAVENOUS
  Administered 2021-04-09: 80 ug via INTRAVENOUS
  Administered 2021-04-09: 120 ug via INTRAVENOUS

## 2021-04-09 MED ORDER — ACETAMINOPHEN 160 MG/5ML PO SOLN: 325.0000 mg | ORAL | Status: DC | PRN

## 2021-04-09 MED ORDER — EPINEPHRINE 1 MG/10ML IJ SOSY
PREFILLED_SYRINGE | INTRAMUSCULAR | Status: AC
  Filled 2021-04-09: qty 10

## 2021-04-09 MED ORDER — ROCURONIUM BROMIDE 10 MG/ML (PF) SYRINGE
PREFILLED_SYRINGE | INTRAVENOUS | Status: DC | PRN
  Administered 2021-04-09: 50 mg via INTRAVENOUS
  Administered 2021-04-09: 20 mg via INTRAVENOUS

## 2021-04-09 MED ORDER — MEPERIDINE HCL 50 MG/ML IJ SOLN: 6.2500 mg | INTRAMUSCULAR | Status: DC | PRN

## 2021-04-09 MED ORDER — PROPOFOL 10 MG/ML IV BOLUS
INTRAVENOUS | Status: AC
  Filled 2021-04-09: qty 20

## 2021-04-09 MED ORDER — LIDOCAINE 2% (20 MG/ML) 5 ML SYRINGE
INTRAMUSCULAR | Status: DC | PRN
  Administered 2021-04-09: 60 mg via INTRAVENOUS

## 2021-04-09 MED ORDER — FENTANYL CITRATE (PF) 100 MCG/2ML IJ SOLN: 25.0000 ug | INTRAMUSCULAR | Status: DC | PRN

## 2021-04-09 MED ORDER — FENTANYL CITRATE (PF) 100 MCG/2ML IJ SOLN
INTRAMUSCULAR | Status: DC | PRN
  Administered 2021-04-09: 50 ug via INTRAVENOUS

## 2021-04-09 MED ORDER — OXYCODONE HCL 5 MG PO TABS
5.0000 mg | ORAL_TABLET | Freq: Once | ORAL | Status: AC | PRN
  Administered 2021-04-11: 5 mg via ORAL

## 2021-04-09 MED ORDER — ONDANSETRON HCL 4 MG/2ML IJ SOLN: 4.0000 mg | Freq: Once | INTRAMUSCULAR | Status: DC | PRN

## 2021-04-09 MED ORDER — OXYCODONE HCL 5 MG/5ML PO SOLN: 5.0000 mg | Freq: Once | ORAL | Status: AC | PRN

## 2021-04-09 MED ORDER — EPHEDRINE SULFATE-NACL 50-0.9 MG/10ML-% IV SOSY
PREFILLED_SYRINGE | INTRAVENOUS | Status: DC | PRN
  Administered 2021-04-09: 10 mg via INTRAVENOUS

## 2021-04-09 MED ORDER — SUGAMMADEX SODIUM 200 MG/2ML IV SOLN
INTRAVENOUS | Status: DC | PRN
  Administered 2021-04-09: 200 mg via INTRAVENOUS

## 2021-04-09 MED ORDER — PHENYLEPHRINE HCL (PRESSORS) 10 MG/ML IV SOLN
INTRAVENOUS | Status: AC
  Filled 2021-04-09: qty 1

## 2021-04-09 MED ORDER — OXYCODONE HCL 5 MG PO TABS
5.0000 mg | ORAL_TABLET | Freq: Once | ORAL | Status: AC | PRN
  Administered 2021-04-11: 5 mg via ORAL
  Filled 2021-04-09 (×2): qty 1

## 2021-04-09 MED ORDER — LACTATED RINGERS IV SOLN: INTRAVENOUS | Status: DC

## 2021-04-09 MED ORDER — FENTANYL CITRATE (PF) 250 MCG/5ML IJ SOLN
INTRAMUSCULAR | Status: AC
  Filled 2021-04-09: qty 5

## 2021-04-09 MED ORDER — ACETAMINOPHEN 325 MG PO TABS
325.0000 mg | ORAL_TABLET | ORAL | Status: DC | PRN
  Administered 2021-04-13: 02:00:00 650 mg via ORAL
  Filled 2021-04-09: qty 2

## 2021-04-09 NOTE — Plan of Care (Signed)
Neurology plan of care  MRI brain wwo contrast motion-degraded but did not show any definitive abnormality including no e/o acute infarct. Mental status continues to improve. Patient is scheduled for bronch today. Further abx mgmt per ID recs. No further inpatient neurologic workup indicated at this time. Neurology to sign off, but please re-engage if additional neurologic concerns arise.  Su Monks, MD [facility] [phone]  If 7pm- 7am, please page neurology on call as listed in [place].

## 2021-04-09 NOTE — Anesthesia Preprocedure Evaluation (Addendum)
Anesthesia Evaluation  Patient identified by MRN, date of birth, ID bandGeneral Assessment Comment: Drowsy but arousable   Reviewed: Allergy & Precautions, NPO status , Patient's Chart, lab work & pertinent test results  History of Anesthesia Complications Negative for: history of anesthetic complications  Airway Mallampati: IV  TM Distance: >3 FB Neck ROM: Full   Comment: Unsure if poor effort on mallampati exam or limited mouth opening Dental  (+) Dental Advisory Given, Poor Dentition, Loose   Pulmonary former smoker,   Lung mass    Pulmonary exam normal        Cardiovascular hypertension, Pt. on home beta blockers and Pt. on medications +CHF  Normal cardiovascular exam     Neuro/Psych negative neurological ROS  negative psych ROS   GI/Hepatic Neg liver ROS, GERD  Medicated and Controlled,  Endo/Other  diabetes, Type 2 Obesity   Renal/GU negative Renal ROS     Musculoskeletal  (+) Arthritis ,   Abdominal   Peds  Hematology  (+) anemia ,  Plt 73k WBC 3.9k    Anesthesia Other Findings Large soft tissue mass at right posterior neck  Reproductive/Obstetrics  Breast cancer                             Anesthesia Physical Anesthesia Plan  ASA: 3  Anesthesia Plan: General   Post-op Pain Management: Minimal or no pain anticipated   Induction: Intravenous  PONV Risk Score and Plan: 3 and Treatment may vary due to age or medical condition, Ondansetron and Dexamethasone  Airway Management Planned: Oral ETT and Video Laryngoscope Planned  Additional Equipment: None  Intra-op Plan:   Post-operative Plan: Extubation in OR  Informed Consent: I have reviewed the patients History and Physical, chart, labs and discussed the procedure including the risks, benefits and alternatives for the proposed anesthesia with the patient or authorized representative who has indicated his/her  understanding and acceptance.     Dental advisory given  Plan Discussed with: CRNA and Anesthesiologist  Anesthesia Plan Comments:        Anesthesia Quick Evaluation

## 2021-04-09 NOTE — TOC Progression Note (Signed)
Transition of Care [facility]) - Progression Note    Patient Details  Name: Marie Church MRN: [id number] Date of Birth: [birth year]  Transition of Care [facility]) CM/SW Contact  Leeroy Cha, RN Phone Number: 04/09/2021, 8:50 AM  Clinical Narrative:     65 year old lady with prior history of malignant right breast cancer with axillary node involvement on active Kadcyla and radiation treatment, chronic systolic heart failure with improvement of left ventricular ejection fraction from 25% to 50%, type 2 diabetes mellitus, hypertension, anemia, thrombocytopenia presents to ED on 03/30/2021 for shortness of breath.   CT angiogram of the chest shows diffuse bilateral groundglass opacities consistent with pulmonary edema versus infectious process.  Patient was started on broad-spectrum IV antibiotics and referred to [facility] for admission for acute respiratory failure with hypoxia secondary to pneumonia in the setting of failed outpatient antibiotic treatment.  Her last CXR from 11/16 shows worsening aeration, then we changed antibiotics to vancomycin and cefepime . She remains on 2 lit of [place] oxygen . Despite broadening the coverage , pt continued to have low grade fever and high fever last night with T MAX OF 103, with lethargy. ABG showed hypercapnia, she was placed on BIPAP with some improvement in her mental status but is not back at baseline.  ID stopped cefepime due to concern for neurotoxicity. 04/08/2021 EEG suggestive of mild diffuse encephalopathy nonspecific etiology.  No seizures or epileptiform changes were noted. TOC PLAN OF CARE>: Following for toc needs-placement versus home care Following for progression.  Expected Discharge Plan: [place] Barriers to Discharge: Continued Medical Work up  Expected Discharge Plan and Services Expected Discharge Plan: [place] Choice: [place] arrangements for the past 2 months: Single  Family Home                                       Social Determinants of Health (SDOH) Interventions    Readmission Risk Interventions Readmission Risk Prevention Plan 04/02/2021 10/10/2020 10/07/2020  Transportation Screening Complete Complete Complete  PCP or Specialist Appt within 5-7 Days - Complete Complete  Home Care Screening - Complete Complete  Medication Review (RN CM) - Complete Complete  Medication Review ([place]) Complete - -  PCP or Specialist appointment within 3-5 days of discharge Complete - -  [place] or Home Care Consult Complete - -  SW Recovery Care/Counseling Consult Complete - -  Palliative Care Screening Not Applicable - -  [place] Not Applicable - -

## 2021-04-09 NOTE — Plan of Care (Addendum)
PCCM Plan of Care Note  # Post-biopsy bleeding: Status post TBLB in RUL, EBNA just past carina in right mainstem.  - if develops life-threatening hemoptysis and pt unable to protect airway, intubate left mainstem and place right lung down - transfusing 1U emergency release platelets - if filling up cup with clot/bloody material, please notify PCCM and we will consider starting nebulized tranexamic acid  [name]

## 2021-04-09 NOTE — Progress Notes (Signed)
PROGRESS NOTE    Marie Church  ZWC:[id number] DOB: [birth year] DOA: 03/30/2021 PCP: System, Provider Not In    Chief Complaint  Patient presents with   Pneumonia    Brief Narrative:  Marie Church is a 65 y.o. 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 on active Kadcyla and radiation treatment, HFrEF (EF improved from 20-25% to 50%), T2DM, HTN, anemia/thrombocytopenia who presented to the ED for evaluation of shortness of breath  Subjective:  Patient was seen prior to bronchoscopy, son at bedside   Assessment & Plan:   Principal Problem:   Pneumonitis Active Problems:   Anemia   Mediastinal lymphadenopathy   Malignant neoplasm of upper-outer quadrant of right breast in female, estrogen receptor negative (HCC)   HFrEF (heart failure with reduced ejection fraction) (HCC)   Hyponatremia   Thrombocytopenia (HCC)   Acute respiratory failure (HCC)   Malnutrition of moderate degree   Acute metabolic encephalopathy   Acute respiratory failure with hypoxia, sepsis secondary to bilateral pneumonia present on admission, failed outpatient treatment for pneumonia. -Differentials include radiation pneumonitis vs metastatic disease from breast cancer.  -Sputum culture no growth, AFB pending, gram stain no WBC or organism. Vancomycin cefepime and acyclovir stopped and Rocephin started on 04/08/2021 -CT angiogram of the chest on 11/13 negative for PE, but showed diffuse ground glass opacities which is persistent on repeat ct chest on 11/21 -COVID-19 PCR, influenza PCR is negative, urine for strep pneumonia is negative,.blood cultures negative till date.  -- ABG showed hypercapnia, BIPAP was used with some improvement in her mental status.  - PCCM and ID following.  Scheduled for bronchoscopy on 04/09/2021   Acute toxic /metabolic encephalopathy Differential include from worsening pneumonia vs meningitis/  encephalitis/hypercapnia MRI brain without contrast is negative for acute intracranial abnormality. Ammonia level is 24, TSH is within normal limits Abg with mild co2 retention Neurology consulted, mri brain no acute findings, eeg unremarkable, continue treating underline metabolic cause, neurology singed off   Aspiration risk mild speech recommends thin liquid with small sips and bites.   Hyponatremia Suspect hypovolemic hyponatremia in the setting of dehydration. Improved, Continue to monitor.    Chronic systolic heart failure/history of nonischemic cardiomyopathy. Echocardiogram on 03/03/2021 showed improvement in left ventricular ejection fraction to 50% compared to the previous echo showing left ventricular ejection fraction at 20 to 25%. Continue with Coreg at 12.5 mg twice daily, Entresto 24-26 mg twice daily. Holding Lasix and spironolactone for now.       Hypotension she was given a 500 cc of normal saline bolus.  Lasix and Aldactone were on hold.  She is on Coreg and Entresto monitor closely.   Metastatic right-sided breast cancer with axillary node involvement Patient follows up with [facility] oncology, currently on active chemotherapy with Kadcyla with last treatment on 03/19/2021 Patient also undergoing radiation treatment through radiation oncology at [facility]. She has one more week of radiation treatment.  She has not received any radiation this week as the radiation machine has been not working.   Anemia/thrombocytopenia Probably secondary to chemotherapy Continue to monitor counts. Hemoglobin stable around 8 and platelet counts are improving   Nutritional Assessment:  The patient's BMI is: Body mass index is 30.82 kg/m.Marland Kitchen  Seen by dietician.  I agree with the assessment and plan as outlined below:  Nutrition Status: Nutrition Problem: Moderate Malnutrition Etiology: chronic illness, cancer and cancer related treatments Signs/Symptoms: mild fat depletion, mild muscle  depletion Interventions: Boost Plus, MVI, Premier  Protein  .     Unresulted Labs (From admission, onward)     Start     Ordered   04/10/21 0500  CBC  Tomorrow morning,   R       Question:  Specimen collection method  Answer:  IV Team=IV Team collect   04/09/21 2303   04/10/21 0500  Comprehensive metabolic panel  Tomorrow morning,   R       Question:  Specimen collection method  Answer:  IV Team=IV Team collect   04/09/21 2303   04/10/21 0500  Magnesium  Tomorrow morning,   R       Question:  Specimen collection method  Answer:  IV Team=IV Team collect   04/09/21 2303   04/10/21 0500  Phosphorus  Tomorrow morning,   R       Question:  Specimen collection method  Answer:  IV Team=IV Team collect   04/09/21 2303   04/09/21 1452  Pneumocystis smear by DFA  RELEASE UPON ORDERING,   TIMED       Comments: Specimen C: Phone [phone]         Previous Biopsy:  no Is the patient on airborne/droplet precautions? No           Clinical History:  N/A Copy of Report to:  N/A Specimen Disposition: Cytology     04/09/21 1452   04/09/21 1452  Aspergillus Ag, BAL/Serum  RELEASE UPON ORDERING,   TIMED       Comments: Specimen C: Phone [phone]         Previous Biopsy:  no Is the patient on airborne/droplet precautions? No           Clinical History:  N/A Copy of Report to:  N/A Specimen Disposition: Cytology     04/09/21 1452   04/09/21 1452  Fungus Culture With Stain  RELEASE UPON ORDERING,   TIMED       Comments: Specimen C: Phone [phone]         Previous Biopsy:  no Is the patient on airborne/droplet precautions? No           Clinical History:  N/A Copy of Report to:  N/A Specimen Disposition: Cytology     04/09/21 1452   04/09/21 1452  Culture, Respiratory w Gram Stain  RELEASE UPON ORDERING,   TIMED       Comments: Specimen C: Phone [phone]         Previous Biopsy:  no Is the patient on airborne/droplet precautions? No           Clinical History:  N/A Copy of Report to:   N/A Specimen Disposition: Cytology     04/09/21 1452   04/09/21 1452  Anaerobic culture w Gram Stain  RELEASE UPON ORDERING,   TIMED       Comments: Specimen C: Phone [phone]         Previous Biopsy:  no Is the patient on airborne/droplet precautions? No           Clinical History:  N/A Copy of Report to:  N/A Specimen Disposition: Cytology     04/09/21 1452   04/07/21 1532  Anaerobic culture w Gram Stain  RELEASE UPON ORDERING,   STAT        04/07/21 1532   04/07/21 1532  Draw extra clot tube  RELEASE UPON ORDERING,   STAT       Question:  Specimen collection method  Answer:  IV Team=IV Team collect   04/07/21  1532   04/07/21 1532  Fungus Culture With Stain  RELEASE UPON ORDERING,   STAT        04/07/21 1532   04/07/21 1516  Culture, fungus without smear  Once,   R        04/07/21 1522   04/07/21 1516  Anaerobic culture w Gram Stain  Once,   R        04/07/21 1522   04/07/21 1516  Oligoclonal bands, CSF + serm  Once,   TIMED        04/07/21 1522   04/07/21 0933  Acid Fast Smear (AFB)  (AFB smear + Culture w reflexed sensitivities panel)  Once,   R       See Hyperspace for full Linked Orders Report.   04/07/21 0932   04/07/21 0933  Acid Fast Culture with reflexed sensitivities  (AFB smear + Culture w reflexed sensitivities panel)  Once,   R       See Hyperspace for full Linked Orders Report.   04/07/21 0932              DVT prophylaxis: enoxaparin (LOVENOX) injection 40 mg Start: 03/30/21 2200   Code Status: Family Communication:  Disposition:   Status is: Inpatient  Dispo: The patient is from: home              Anticipated d/c is to: TBD              Anticipated d/c date is: TBD                Consultants:  Pulmonology ID neurology  Procedures:  bronchoscopy  Antimicrobials:   Anti-infectives (From admission, onward)    Start     Dose/Rate Route Frequency Ordered Stop   04/08/21 2000  meropenem (MERREM) 1 g in sodium chloride 0.9 % 100 mL IVPB   Status:  Discontinued        1 g 200 mL/hr over 30 Minutes Intravenous Every 8 hours 04/08/21 1347 04/08/21 1349   04/08/21 2000  cefTRIAXone (ROCEPHIN) 2 g in sodium chloride 0.9 % 100 mL IVPB        2 g 200 mL/hr over 30 Minutes Intravenous Every 24 hours 04/08/21 1349     04/08/21 1000  cefTRIAXone (ROCEPHIN) 2 g in sodium chloride 0.9 % 100 mL IVPB  Status:  Discontinued        2 g 200 mL/hr over 30 Minutes Intravenous Every 24 hours 04/07/21 0905 04/07/21 0931   04/07/21 2200  acyclovir (ZOVIRAX) 800 mg in dextrose 5 % 150 mL IVPB  Status:  Discontinued        800 mg 166 mL/hr over 60 Minutes Intravenous Every 8 hours 04/07/21 1451 04/08/21 1341   04/07/21 2000  meropenem (MERREM) 2 g in sodium chloride 0.9 % 100 mL IVPB  Status:  Discontinued        2 g 200 mL/hr over 30 Minutes Intravenous Every 8 hours 04/07/21 1451 04/08/21 1347   04/07/21 1200  acyclovir (ZOVIRAX) 800 mg in dextrose 5 % 150 mL IVPB  Status:  Discontinued        800 mg 166 mL/hr over 60 Minutes Intravenous Every 8 hours 04/07/21 1037 04/07/21 1451   04/07/21 1100  vancomycin (VANCOREADY) IVPB 1000 mg/200 mL  Status:  Discontinued        1,000 mg 200 mL/hr over 60 Minutes Intravenous Every 12 hours 04/07/21 1037 04/08/21 1340   04/07/21 1030  meropenem (MERREM)  2 g in sodium chloride 0.9 % 100 mL IVPB  Status:  Discontinued        2 g 200 mL/hr over 30 Minutes Intravenous Every 8 hours 04/07/21 1010 04/07/21 1451   04/07/21 1000  cefTRIAXone (ROCEPHIN) 2 g in sodium chloride 0.9 % 100 mL IVPB  Status:  Discontinued        2 g 200 mL/hr over 30 Minutes Intravenous Every 12 hours 04/07/21 0931 04/07/21 0955   04/03/21 2200  vancomycin (VANCOREADY) IVPB 1750 mg/350 mL  Status:  Discontinued        1,750 mg 175 mL/hr over 120 Minutes Intravenous Every 24 hours 04/03/21 0850 04/04/21 0951   04/03/21 1000  vancomycin (VANCOREADY) IVPB 1500 mg/300 mL        1,500 mg 150 mL/hr over 120 Minutes Intravenous  Once  04/03/21 0850 04/03/21 1416   04/03/21 0900  ceFEPIme (MAXIPIME) 2 g in sodium chloride 0.9 % 100 mL IVPB  Status:  Discontinued        2 g 200 mL/hr over 30 Minutes Intravenous Every 8 hours 04/03/21 0850 04/07/21 0857   03/31/21 0200  cefTRIAXone (ROCEPHIN) 2 g in sodium chloride 0.9 % 100 mL IVPB  Status:  Discontinued        2 g 200 mL/hr over 30 Minutes Intravenous Every 24 hours 03/30/21 2051 04/03/21 0823   03/30/21 1630  azithromycin (ZITHROMAX) 500 mg in sodium chloride 0.9 % 250 mL IVPB  Status:  Discontinued        500 mg 250 mL/hr over 60 Minutes Intravenous Every 24 hours 03/30/21 1619 04/03/21 0823   03/30/21 1630  ceFEPIme (MAXIPIME) 2 g in sodium chloride 0.9 % 100 mL IVPB        2 g 200 mL/hr over 30 Minutes Intravenous  Once 03/30/21 1625 03/30/21 1724           Objective: Vitals:   04/09/21 1615 04/09/21 1630 04/09/21 2140 04/09/21 2155  BP: 130/66 123/66  (!) 143/83  Pulse: (!) 103 (!) 104 (!) 102 95  Resp: 16 18 (!) 27 12  Temp:    98.3 F (36.8 C)  TempSrc:    Oral  SpO2: 95% 96% 94% 98%  Weight:      Height:        Intake/Output Summary (Last 24 hours) at 04/09/2021 2306 Last data filed at 04/09/2021 1536 Gross per 24 hour  Intake 380 ml  Output 50 ml  Net 330 ml   Filed Weights   04/07/21 0500 04/08/21 0550 04/09/21 0500  Weight: 80.8 kg 84.4 kg 87 kg    Examination:  General exam: alert, following command, but very weak,  Respiratory system: right is more diminished than left, slightly tachypneic ,no accessory muscle use Cardiovascular system:  RRR.  Gastrointestinal system: Abdomen is nondistended, soft and nontender.  Normal bowel sounds heard. Central nervous system: Alert and oriented. No focal neurological deficits. Extremities:  no edema Skin: No rashes, lesions or ulcers Psychiatry: Very weak, but appear appropriate currently.     Data Reviewed: I have personally reviewed following labs and imaging studies  CBC: Recent  Labs  Lab 04/04/21 0359 04/05/21 0459 04/06/21 0009 04/07/21 1519 04/09/21 0240  WBC 6.1 4.5 4.3 4.2 3.9*  NEUTROABS  --   --   --  1.9  --   HGB 8.9* 8.7* 8.2* 8.7* 8.0*  HCT 29.5* 28.6* 27.3* 29.4* 26.9*  MCV 86.5 87.2 86.1 88.3 87.6  PLT 126* 120* 114*  88* 73*    Basic Metabolic Panel: Recent Labs  Lab 04/04/21 0359 04/05/21 0459 04/06/21 0009 04/07/21 0100 04/07/21 1519 04/09/21 0240  NA 132* 135 131*  --  131* 136  K 3.9 3.9 4.0  --  4.5 3.8  CL 99 100 95*  --  96* 99  CO2 31 32 33*  --  32 34*  GLUCOSE 110* 102* 111*  --  93 89  BUN 7* 9 7*  --  9 10  CREATININE 0.52 0.51 0.49 0.76 0.63 0.59  CALCIUM 8.0* 8.4* 8.1*  --  8.0* 8.0*    GFR: Estimated Creatinine Clearance: 80.2 mL/min (by C-G formula based on SCr of 0.59 mg/dL).  Liver Function Tests: Recent Labs  Lab 04/07/21 1519 04/09/21 0240  AST 52* 65*  ALT 20 24  ALKPHOS 73 96  BILITOT 0.5 0.2*  PROT 8.2* 8.1  ALBUMIN 2.2* 2.0*    CBG: No results for input(s): GLUCAP in the last 168 hours.   Recent Results (from the past 240 hour(s))  Culture, blood (Routine X 2) w Reflex to ID Panel     Status: None   Collection Time: 04/02/21  6:26 PM   Specimen: BLOOD  Result Value Ref Range Status   Specimen Description   Final    BLOOD BLOOD LEFT HAND Performed at [facility] [address] [place], [place] [address]    Special Requests   Final    BOTTLES DRAWN AEROBIC ONLY Blood Culture adequate volume Performed at [facility] [address], [place], [place] [address]    Culture   Final    NO GROWTH 5 DAYS Performed at [facility], [place] [address] [place], [place] [address]    Report Status 04/07/2021 FINAL  Final  Culture, blood (Routine X 2) w Reflex to ID Panel     Status: None   Collection Time: 04/02/21  7:43 PM   Specimen: BLOOD LEFT WRIST  Result Value Ref Range Status   Specimen Description   Final    BLOOD LEFT WRIST Performed at  [facility] [address], [place], [place] [address]    Special Requests   Final    BOTTLES DRAWN AEROBIC ONLY Blood Culture adequate volume Performed at [facility] [address], [place], [place] [address]    Culture   Final    NO GROWTH 5 DAYS Performed at [facility], [place] [address] [place], [place] [address]    Report Status 04/07/2021 FINAL  Final  MRSA Next Gen by PCR, Nasal     Status: None   Collection Time: 04/03/21 11:26 AM   Specimen: Nasal Mucosa; Nasal Swab  Result Value Ref Range Status   MRSA by PCR Next Gen NOT DETECTED NOT DETECTED Final    Comment: (NOTE) The GeneXpert MRSA Assay (FDA approved for NASAL specimens only), is one component of a comprehensive MRSA colonization surveillance program. It is not intended to diagnose MRSA infection nor to guide or monitor treatment for MRSA infections. Test performance is not FDA approved in patients less than 65 years old. Performed at [facility], [place] [address], [place], [place] [address]   Culture, blood (routine x 2)     Status: None (Preliminary result)   Collection Time: 04/05/21  3:24 PM   Specimen: BLOOD  Result Value Ref Range Status   Specimen Description   Final    BLOOD BLOOD LEFT  HAND Performed at [facility], [place] [address], [place], [place] [address]    Special Requests   Final    BOTTLES DRAWN AEROBIC ONLY Blood Culture adequate volume Performed at [facility] [address] [place], [place] [address]    Culture   Final    NO GROWTH 4 DAYS Performed at [facility], [place] [address], [place], [place] [address]    Report Status PENDING  Incomplete  Culture, blood (routine x 2)     Status: None (Preliminary result)   Collection Time: 04/05/21  3:24 PM   Specimen: BLOOD  Result Value Ref Range Status   Specimen Description   Final    BLOOD BLOOD LEFT WRIST Performed at [facility] [address], [place], [place] [address]    Special Requests   Final    BOTTLES DRAWN AEROBIC ONLY Blood Culture adequate volume Performed at [facility] [address], [place], [place] [address]    Culture   Final    NO GROWTH 4 DAYS Performed at [facility], [place] [address] [place], [place] [address]    Report Status PENDING  Incomplete  Expectorated Sputum Assessment w Gram Stain, Rflx to Resp Cult     Status: None   Collection Time: 04/06/21 10:00 AM   Specimen: Expectorated Sputum  Result Value Ref Range Status   Specimen Description EXPECTORATED SPUTUM  Final   Special Requests NONE  Final   Sputum evaluation   Final    THIS SPECIMEN IS ACCEPTABLE FOR SPUTUM CULTURE Performed at [facility], [place] [address] [place], [place] [address]    Report Status 04/06/2021 FINAL  Final  Culture, Respiratory w Gram Stain     Status: None   Collection Time: 04/06/21 10:00 AM  Result Value Ref Range Status   Specimen Description   Final    EXPECTORATED SPUTUM Performed at [facility], [place] [address], [place], [place] [address]    Special Requests   Final    NONE Reflexed from [id number] Performed at [facility], [place] [address] [place], [place] [address]    Gram Stain   Final    FEW SQUAMOUS EPITHELIAL CELLS PRESENT FEW WBC SEEN FEW YEAST MODERATE GRAM POSITIVE RODS MODERATE GRAM POSITIVE COCCI    Culture   Final    ABUNDANT Normal respiratory flora-no Staph aureus or Pseudomonas seen Performed at [facility], [address], [place], [place] [address]    Report Status 04/08/2021 FINAL  Final  Respiratory (~20 pathogens) panel by PCR     Status: None   Collection Time: 04/07/21  9:29 AM   Specimen: Nasopharyngeal Swab; Respiratory  Result Value Ref Range Status   Adenovirus NOT DETECTED NOT DETECTED Final   Coronavirus 229E NOT DETECTED NOT DETECTED Final     Comment: (NOTE) The Coronavirus on the Respiratory Panel, DOES NOT test for the novel  Coronavirus (2019 nCoV)    Coronavirus HKU1 NOT DETECTED NOT DETECTED Final   Coronavirus NL63 NOT DETECTED NOT DETECTED Final   Coronavirus OC43 NOT DETECTED NOT DETECTED Final   Metapneumovirus NOT DETECTED NOT DETECTED Final   Rhinovirus / Enterovirus NOT DETECTED NOT DETECTED Final   Influenza A NOT DETECTED NOT DETECTED Final   Influenza B NOT DETECTED NOT DETECTED Final   Parainfluenza Virus 1 NOT DETECTED NOT DETECTED Final   Parainfluenza Virus 2 NOT DETECTED NOT DETECTED  Final   Parainfluenza Virus 3 NOT DETECTED NOT DETECTED Final   Parainfluenza Virus 4 NOT DETECTED NOT DETECTED Final   Respiratory Syncytial Virus NOT DETECTED NOT DETECTED Final   Bordetella pertussis NOT DETECTED NOT DETECTED Final   Bordetella Parapertussis NOT DETECTED NOT DETECTED Final   Chlamydophila pneumoniae NOT DETECTED NOT DETECTED Final   Mycoplasma pneumoniae NOT DETECTED NOT DETECTED Final    Comment: Performed at [facility], [place] [address] [place], [place] [address]  CSF culture w Stat Gram Stain     Status: None (Preliminary result)   Collection Time: 04/07/21  2:28 PM   Specimen: CSF; Cerebrospinal Fluid  Result Value Ref Range Status   Specimen Description   Final    CSF Performed at [facility] [address] [place], [place] [address]    Special Requests   Final    NONE Performed at [facility], [place] [address] [place], [place] [address]    Gram Stain   Final    NO WBC SEEN NO ORGANISMS SEEN CYTOSPIN SMEAR Gram Stain Report Called to,Read Back By and Verified With: R.MICHAELS, RN AT 1850 ON 11.21.22 BY N.THOMPSON Performed at [facility] [address] [place], [place] [address]    Culture   Final    NO GROWTH 2 DAYS Performed at [facility] [address] [place], [place] [address]    Report Status PENDING   Incomplete         Radiology Studies: MR BRAIN W WO CONTRAST  Result Date: 04/08/2021 CLINICAL DATA:  Transient ischemic attack (TIA); acute stroke, follow up Neuro deficit, acute, stroke suspected EXAM: MRI HEAD WITHOUT AND WITH CONTRAST TECHNIQUE: Multiplanar, multiecho pulse sequences of the brain and surrounding structures were obtained without and with intravenous contrast. CONTRAST:  38m GADAVIST GADOBUTROL 1 MMOL/ML IV SOLN COMPARISON:  04/06/2021 FINDINGS: Motion artifact is present. Brain: There is no acute infarction or intracranial hemorrhage. There is no intracranial mass, mass effect, or edema. There is no hydrocephalus or extra-axial fluid collection. Ventricles and sulci are normal in size and configuration. Patchy foci of T2 hyperintensity in the supratentorial white matter are nonspecific but may reflect stable chronic microvascular ischemic changes. No definite abnormal enhancement. Vascular: Major vessel flow voids at the skull base are preserved. Skull and upper cervical spine: Normal marrow signal is preserved. Sinuses/Orbits: Mild patchy mucosal thickening. Orbits are unremarkable. Other: Sella is unremarkable.  Mastoid air cells are clear. IMPRESSION: Motion degraded. No acute abnormality.  No definite abnormal enhancement. Electronically Signed   By: PMacy MisM.D.   On: 04/08/2021 19:39   DG Chest Port 1 View  Result Date: 04/09/2021 CLINICAL DATA:  Status post bronchoscopy, previous abnormal chest x-ray EXAM: PORTABLE CHEST 1 VIEW COMPARISON:  04/05/2021 FINDINGS: Single frontal view of the chest demonstrates stable left chest wall port. Cardiac silhouette is unremarkable. Progression of the consolidation within the right upper lobe, likely representing post bronchoscopy changes. This could reflect edema or hemorrhage. The multifocal ground-glass airspace disease seen elsewhere throughout the lungs is stable. No large effusion or pneumothorax. No acute bony  abnormalities. IMPRESSION: 1. Persistent bilateral ground-glass airspace disease consistent with edema or infection. Increased density in the right upper lobe likely represents post bronchoscopy changes, either lavage fluid or hemorrhage. 2. No evidence of pneumothorax. Electronically Signed   By: MRanda NgoM.D.   On: 04/09/2021 16:19   EEG adult  Result Date: 04/08/2021 YZeb Comfort  Jenetta Downer, MD     04/08/2021  1:50 PM Patient Name: Keyleen Cerrato MRN: [id number] Epilepsy Attending: Lora Havens Referring Physician/Provider: Gardiner Barefoot, NP Date: 04/08/2021 Duration: 24.15 mins Patient history: 65 year old female with right breast cancer on chemotherapy who presented with altered mental status.  EEG to evaluate for seizure. Level of alertness: Awake AEDs during EEG study: None Technical aspects: This EEG study was done with scalp electrodes positioned according to the 10-20 International system of electrode placement. Electrical activity was acquired at a sampling rate of _0  and reviewed with a high frequency filter of _1  and a low frequency filter of _2 . EEG data were recorded continuously and digitally stored. Description: The posterior dominant rhythm consists of _3  activity of moderate voltage (25-35 uV) seen predominantly in posterior head regions, symmetric and reactive to eye opening and eye closing. EEG showed intermittent generalized polymorphic 3 to 6 Hz theta-delta slowing, at times with triphasic morphology. Hyperventilation and photic stimulation were not performed.   ABNORMALITY - Intermittent slow, generalized IMPRESSION: This study is suggestive of mild diffuse encephalopathy, nonspecific etiology. No seizures or epileptiform discharges were seen throughout the recording. Lora Havens   DG C-ARM BRONCHOSCOPY  Result Date: 04/09/2021 C-ARM BRONCHOSCOPY: Fluoroscopy was utilized by the requesting physician.  No radiographic interpretation.        Scheduled  Meds:  carvedilol  12.5 mg Oral BID WC   Chlorhexidine Gluconate Cloth  6 each Topical Daily   enoxaparin (LOVENOX) injection  40 mg Subcutaneous Q24H   lactose free nutrition  237 mL Oral TID WC   mouth rinse  15 mL Mouth Rinse BID   melatonin  3 mg Oral QHS   multivitamin with minerals  1 tablet Oral Daily   Ensure Max Protein  11 oz Oral BID   sacubitril-valsartan  1 tablet Oral BID   sodium chloride flush  3 mL Intravenous Q12H   Continuous Infusions:  cefTRIAXone (ROCEPHIN)  IV 2 g (04/09/21 2106)     LOS: 9 days   Time spent: 34mns Greater than 50% of this time was spent in counseling, explanation of diagnosis, planning of further management, and coordination of care.   Voice Recognition /Viviann Sparedictation system was used to create this note, attempts have been made to correct errors. Please contact the author with questions and/or clarifications.   FFlorencia Reasons MD PhD FACP [facility]  Available via Epic secure chat 7am-7pm for nonurgent issues Please page for urgent issues To page the attending provider between 7A-7P or the covering provider during after hours 7P-7A, please log into the web site [email or web address] and access using universal [place] password for that web site. If you do not have the password, please call the hospital operator.    04/09/2021, 11:06 PM

## 2021-04-09 NOTE — Interval H&P Note (Signed)
History and Physical Interval Note:  04/09/2021 12:18 PM  Marie Church  has presented today for surgery, with the diagnosis of lung mass.  The various methods of treatment have been discussed with the patient and family. After consideration of risks, benefits and other options for treatment, the patient has consented to  Procedure(s): VIDEO BRONCHOSCOPY WITH FLUORO (N/A) ENDOBRONCHIAL ULTRASOUND (N/A) as a surgical intervention.  The patient's history has been reviewed, patient examined, no change in status, stable for surgery.  I have reviewed the patient's chart and labs.  Questions were answered to the patient's satisfaction.     [name]

## 2021-04-09 NOTE — Progress Notes (Signed)
PT Cancellation Note  Patient Details Name: Marie Church MRN: [id number] DOB: [birth year]   Cancelled Treatment:   Pt working with OT this morning, then at procedure (Bronch) then suppose to have Radiation later this afternoon.  Will attempt to see another day.   Rica Koyanagi  PTA Acute  Rehabilitation Services Pager      [phone] Office      [phone]

## 2021-04-09 NOTE — Progress Notes (Signed)
Occupational Therapy Treatment Patient Details Name: Marie Church MRN: [id number] DOB: [birth year] Today's Date: 04/09/2021   History of present illness Marie Church is a 65 year old female with past medical history significant for malignant right breast cancer with axillary node involvement on active Kadcyla and radiation, chronic systolic congestive heart failure (EF 50%), type 2 diabetes mellitus, essential hypertension, anemia/thrombocytopenia who presented to [facility] ED on 11/13 for shortness of breath.CT angiogram chest with no evidence of central pulmonary embolism,diffuse bilateral groundglass opacities consistent with pulmonary edema versus infectious process.seen in ED on 11/11 following her radiation treatment for evaluation of cough, fever and weakness, treated for pneumonia and sent home   OT comments  Patient initially drowsy when therapist entered room. Patient performed grooming task in bed with thoroughness with set up only. Patient min assist to transfer to side of bed and min guard to transfer to recliner without a device. Patient encouraged to get up to chair daily and perform leg and arm exercises to improve strength. Patient verbalized understanding. Recommendation is still appropriate as patient does need short term rehab however discharge planning may be difficult as she may not have any insurance. Will continue POC.   Recommendations for follow up therapy are one component of a multi-disciplinary discharge planning process, led by the attending physician.  Recommendations may be updated based on patient status, additional functional criteria and insurance authorization.    Follow Up Recommendations  Skilled nursing-short term rehab (<3 hours/day)    Assistance Recommended at Discharge    Equipment Recommendations  Other (comment) (TBD)    Recommendations for Other Services      Precautions / Restrictions Precautions Precautions: Fall Precaution Comments: monitor  O2 and HR, has LE cellulitis, very weak legs Restrictions Weight Bearing Restrictions: No       Mobility Bed Mobility Overal bed mobility: Needs Assistance Bed Mobility: Supine to Sit     Supine to sit: Min assist;HOB elevated     General bed mobility comments: min assist for hand hold to transfer in to sitting.    Transfers Overall transfer level: Needs assistance   Transfers: Sit to/from Stand;Bed to chair/wheelchair/BSC Sit to Stand: Min guard Stand pivot transfers: Min guard         General transfer comment: min guard to transfer to recliner.     Balance Overall balance assessment: Mild deficits observed, not formally tested                                         ADL either performed or assessed with clinical judgement   ADL Overall ADL's : Needs assistance/impaired     Grooming: Set up;Sitting;Oral care;Wash/dry face Grooming Details (indicate cue type and reason): to improve alertness patient performed grooming in bed. Patient did well and performed thorough job.             Lower Body Dressing: Maximal assistance Lower Body Dressing Details (indicate cue type and reason): to don socks                    Extremity/Trunk Assessment              Vision   Vision Assessment?: No apparent visual deficits   Perception     Praxis      Cognition Arousal/Alertness: Awake/alert Behavior During Therapy: WFL for tasks assessed/performed Overall Cognitive Status: Within Functional Limits for tasks assessed  Exercises     Shoulder Instructions       General Comments      Pertinent Vitals/ Pain       Pain Assessment: No/denies pain  Home Living                                          Prior Functioning/Environment              Frequency  Min 2X/week        Progress Toward Goals  OT Goals(current goals can now be found in  the care plan section)  Progress towards OT goals: Progressing toward goals  Acute Rehab OT Goals Patient Stated Goal: get stronger OT Goal Formulation: With patient/family Time For Goal Achievement: 04/15/21 Potential to Achieve Goals: Good  Plan Discharge plan remains appropriate    Co-evaluation          OT goals addressed during session: ADL's and self-care      AM-PAC OT "6 Clicks" Daily Activity     Outcome Measure   Help from another person eating meals?: A Little Help from another person taking care of personal grooming?: A Little Help from another person toileting, which includes using toliet, bedpan, or urinal?: A Little Help from another person bathing (including washing, rinsing, drying)?: A Little Help from another person to put on and taking off regular upper body clothing?: A Little Help from another person to put on and taking off regular lower body clothing?: A Lot 6 Click Score: 17    End of Session    OT Visit Diagnosis: Unsteadiness on feet (R26.81);Pain;Muscle weakness (generalized) (M62.81)   Activity Tolerance Patient tolerated treatment well   Patient Left in chair;with call bell/phone within reach;with chair alarm set   Nurse Communication Mobility status        Time: 0045-9977 OT Time Calculation (min): 26 min  Charges: OT General Charges $OT Visit: 1 Visit OT Treatments $Self Care/Home Management : 8-22 mins $Therapeutic Activity: 8-22 mins  Perlie Stene, OTR/L [place]  Office [phone] Pager: [place] 04/09/2021, 11:45 AM

## 2021-04-09 NOTE — Transfer of Care (Signed)
Immediate Anesthesia Transfer of Care Note  Patient: Marie Church  Procedure(s) Performed: VIDEO BRONCHOSCOPY WITH FLUORO ENDOBRONCHIAL ULTRASOUND BRONCHIAL NEEDLE ASPIRATION BIOPSIES BRONCHIAL WASHINGS BRONCHIAL BIOPSIES  Patient Location: PACU  Anesthesia Type:General  Level of Consciousness: sedated, patient cooperative and responds to stimulation  Airway & Oxygen Therapy: Patient Spontanous Breathing and Patient connected to face mask oxygen  Post-op Assessment: Report given to RN and Post -op Vital signs reviewed and stable  Post vital signs: Reviewed and stable  Last Vitals:  Vitals Value Taken Time  BP 168/79 04/09/21 1552  Temp    Pulse 108 04/09/21 1601  Resp 28 04/09/21 1601  SpO2 86 % 04/09/21 1601  Vitals shown include unvalidated device data.  Last Pain:  Vitals:   04/09/21 1218  TempSrc: Oral  PainSc: 0-No pain      Patients Stated Pain Goal: 0 (09/81/19 1478)  Complications: No notable events documented.

## 2021-04-09 NOTE — Op Note (Signed)
Flexible and EBUS Bronchoscopy Procedure Note  Marie Church  [id number]  1955/07/03  Date:04/09/21  Time:3:31 PM   Provider Performing:Hermina Barnard Jerilynn Mages Verlee [place]   Procedure: Flexible bronchoscopy and EBUS Bronchoscopy  Indication(s) Multifocal pneumonia Lymphadenopathy Right breast cancer on kadcyla and undergoing radiation  Consent Risks of the procedure as well as the alternatives and risks of each were explained to the patient and/or caregiver.  Consent for the procedure was obtained.  Anesthesia General Anesthesia   Time Out Verified patient identification, verified procedure, site/side was marked, verified correct patient position, special equipment/implants available, medications/allergies/relevant history reviewed, required imaging and test results available.   Sterile Technique Usual hand hygiene, masks, gowns, and gloves were used   Procedure Description Diagnostic bronchoscope advanced through endotracheal tube and into airway.  Airways were examined down to subsegmental level with findings noted below. The diagnostic bronchoscope was then removed and the EBUS bronchoscope was advanced into airway with stations 7S, 4R biopsied and sent for slide, cell block, and/or culture.  The EBUS bronchoscope was removed after assuring no active bleeding from biopsy site. Flexible scope inserted and BAL performed in RUL anterior segment to be sent for micro and cyto. TBLB then performed in RUL with 7 passes under fluoro. Scope wedged and then removed but ran into some bleeding which required application of iced saline x2, epi x3 and wedging scope for tamponade. 1 unit of emergency release platelets ordered to be transfused immediately. Confirmed hemostasis with clot forming in RUL and scope removed.   Findings:  - 49m 7S LN s/p EBNA - 175m4R LN s/p EBNA - Unremarkable airway exam    Complications/Tolerance Hemoptysis Chest X-ray is needed post  procedure.   EBL 20cc   Specimen(s) EBNA sent for cytology RUL TBLB sent for path BAL sent for micro, cyto, cell count/diff

## 2021-04-09 NOTE — Anesthesia Postprocedure Evaluation (Signed)
Anesthesia Post Note  Patient: Marie Church  Procedure(s) Performed: VIDEO BRONCHOSCOPY WITH FLUORO ENDOBRONCHIAL ULTRASOUND BRONCHIAL NEEDLE ASPIRATION BIOPSIES BRONCHIAL WASHINGS BRONCHIAL BIOPSIES HEMOSTASIS CONTROL     Patient location during evaluation: PACU Anesthesia Type: General Level of consciousness: awake and alert Pain management: pain level controlled Vital Signs Assessment: post-procedure vital signs reviewed and stable Respiratory status: spontaneous breathing, nonlabored ventilation, respiratory function stable and patient connected to nasal cannula oxygen Cardiovascular status: blood pressure returned to baseline and stable Postop Assessment: no apparent nausea or vomiting Anesthetic complications: no   No notable events documented.  Last Vitals:  Vitals:   04/09/21 1615 04/09/21 1630  BP: 130/66 123/66  Pulse: (!) 103 (!) 104  Resp: 16 18  Temp:    SpO2: 95% 96%    Last Pain:  Vitals:   04/09/21 1630  TempSrc:   PainSc: 0-No pain                 Lam Mccubbins

## 2021-04-09 NOTE — Anesthesia Procedure Notes (Signed)
Procedure Name: Intubation Date/Time: 04/09/2021 4:02 PM Performed by: Gean Maidens, CRNA Pre-anesthesia Checklist: Patient identified, Emergency Drugs available, Suction available, Patient being monitored and Timeout performed Patient Re-evaluated:Patient Re-evaluated prior to induction Oxygen Delivery Method: Circle system utilized Preoxygenation: Pre-oxygenation with 100% oxygen Induction Type: IV induction Ventilation: Mask ventilation without difficulty Laryngoscope Size: Glidescope and 3 Grade View: Grade I Tube type: Oral Tube size: 8.5 mm Number of attempts: 1 Airway Equipment and Method: Stylet and Video-laryngoscopy Placement Confirmation: ETT inserted through vocal cords under direct vision, positive ETCO2 and breath sounds checked- equal and bilateral Secured at: 22 cm Tube secured with: Tape Dental Injury: Teeth and Oropharynx as per pre-operative assessment  Comments: Glidescope used d/t multiple loose teeth present, poor mouth opening, and posterior neck mass

## 2021-04-10 ENCOUNTER — Ambulatory Visit: Payer: Self-pay

## 2021-04-10 DIAGNOSIS — R918 Other nonspecific abnormal finding of lung field: Secondary | ICD-10-CM

## 2021-04-10 DIAGNOSIS — J9601 Acute respiratory failure with hypoxia: Secondary | ICD-10-CM

## 2021-04-10 DIAGNOSIS — J189 Pneumonia, unspecified organism: Secondary | ICD-10-CM

## 2021-04-10 LAB — CULTURE, BLOOD (ROUTINE X 2)
Culture: NO GROWTH
Culture: NO GROWTH
Special Requests: ADEQUATE
Special Requests: ADEQUATE

## 2021-04-10 LAB — MAGNESIUM: Magnesium: 1.9 mg/dL (ref 1.7–2.4)

## 2021-04-10 LAB — COMPREHENSIVE METABOLIC PANEL
ALT: 28 U/L (ref 0–44)
AST: 84 U/L — ABNORMAL HIGH (ref 15–41)
Albumin: 2.1 g/dL — ABNORMAL LOW (ref 3.5–5.0)
Alkaline Phosphatase: 169 U/L — ABNORMAL HIGH (ref 38–126)
Anion gap: 3 — ABNORMAL LOW (ref 5–15)
BUN: 10 mg/dL (ref 8–23)
CO2: 34 mmol/L — ABNORMAL HIGH (ref 22–32)
Calcium: 7.8 mg/dL — ABNORMAL LOW (ref 8.9–10.3)
Chloride: 98 mmol/L (ref 98–111)
Creatinine, Ser: 0.61 mg/dL (ref 0.44–1.00)
GFR, Estimated: >60 mL/min (ref >60)
Glucose, Bld: 161 mg/dL — ABNORMAL HIGH (ref 70–99)
Potassium: 4.2 mmol/L (ref 3.5–5.1)
Sodium: 135 mmol/L (ref 135–145)
Total Bilirubin: 0.5 mg/dL (ref 0.3–1.2)
Total Protein: 8.3 g/dL — ABNORMAL HIGH (ref 6.5–8.1)

## 2021-04-10 LAB — PNEUMOCYSTIS JIROVECI SMEAR BY DFA: Pneumocystis jiroveci Ag: NEGATIVE

## 2021-04-10 LAB — CBC
HCT: 27.4 % — ABNORMAL LOW (ref 36.0–46.0)
Hemoglobin: 8.2 g/dL — ABNORMAL LOW (ref 12.0–15.0)
MCH: 26.1 pg (ref 26.0–34.0)
MCHC: 29.9 g/dL — ABNORMAL LOW (ref 30.0–36.0)
MCV: 87.3 fL (ref 80.0–100.0)
Platelets: 75 10*3/uL — ABNORMAL LOW (ref 150–400)
RBC: 3.14 MIL/uL — ABNORMAL LOW (ref 3.87–5.11)
RDW: 17.5 % — ABNORMAL HIGH (ref 11.5–15.5)
WBC: 5.6 10*3/uL (ref 4.0–10.5)
nRBC: 0 % (ref 0.0–0.2)

## 2021-04-10 LAB — BPAM PLATELET PHERESIS
Blood Product Expiration Date: 202211262359
ISSUE DATE / TIME: 202211231517
Unit Type and Rh: 5100

## 2021-04-10 LAB — PREPARE PLATELET PHERESIS: Unit division: 0

## 2021-04-10 LAB — PHOSPHORUS: Phosphorus: 1.9 mg/dL — ABNORMAL LOW (ref 2.5–4.6)

## 2021-04-10 MED ORDER — K PHOS MONO-SOD PHOS DI & MONO 155-852-130 MG PO TABS
500.0000 mg | ORAL_TABLET | Freq: Every day | ORAL | Status: AC
  Administered 2021-04-10 – 2021-04-11 (×2): 500 mg via ORAL
  Filled 2021-04-10 (×2): qty 2

## 2021-04-10 MED ORDER — MIRTAZAPINE 15 MG PO TABS
7.5000 mg | ORAL_TABLET | Freq: Every day | ORAL | Status: DC
  Administered 2021-04-10 – 2021-04-14 (×5): 7.5 mg via ORAL
  Filled 2021-04-10 (×5): qty 1

## 2021-04-10 MED ORDER — DIPHENHYDRAMINE HCL 25 MG PO CAPS
25.0000 mg | ORAL_CAPSULE | Freq: Once | ORAL | Status: AC
Start: 2021-04-10 — End: 2021-04-10
  Administered 2021-04-10: 25 mg via ORAL
  Filled 2021-04-10: qty 1

## 2021-04-10 NOTE — Progress Notes (Signed)
Subjective: No new complaints   Antibiotics:  Anti-infectives (From admission, onward)    Start     Dose/Rate Route Frequency Ordered Stop   04/08/21 2000  meropenem (MERREM) 1 g in sodium chloride 0.9 % 100 mL IVPB  Status:  Discontinued        1 g 200 mL/hr over 30 Minutes Intravenous Every 8 hours 04/08/21 1347 04/08/21 1349   04/08/21 2000  cefTRIAXone (ROCEPHIN) 2 g in sodium chloride 0.9 % 100 mL IVPB  Status:  Discontinued        2 g 200 mL/hr over 30 Minutes Intravenous Every 24 hours 04/08/21 1349 04/10/21 0947   04/08/21 1000  cefTRIAXone (ROCEPHIN) 2 g in sodium chloride 0.9 % 100 mL IVPB  Status:  Discontinued        2 g 200 mL/hr over 30 Minutes Intravenous Every 24 hours 04/07/21 0905 04/07/21 0931   04/07/21 2200  acyclovir (ZOVIRAX) 800 mg in dextrose 5 % 150 mL IVPB  Status:  Discontinued        800 mg 166 mL/hr over 60 Minutes Intravenous Every 8 hours 04/07/21 1451 04/08/21 1341   04/07/21 2000  meropenem (MERREM) 2 g in sodium chloride 0.9 % 100 mL IVPB  Status:  Discontinued        2 g 200 mL/hr over 30 Minutes Intravenous Every 8 hours 04/07/21 1451 04/08/21 1347   04/07/21 1200  acyclovir (ZOVIRAX) 800 mg in dextrose 5 % 150 mL IVPB  Status:  Discontinued        800 mg 166 mL/hr over 60 Minutes Intravenous Every 8 hours 04/07/21 1037 04/07/21 1451   04/07/21 1100  vancomycin (VANCOREADY) IVPB 1000 mg/200 mL  Status:  Discontinued        1,000 mg 200 mL/hr over 60 Minutes Intravenous Every 12 hours 04/07/21 1037 04/08/21 1340   04/07/21 1030  meropenem (MERREM) 2 g in sodium chloride 0.9 % 100 mL IVPB  Status:  Discontinued        2 g 200 mL/hr over 30 Minutes Intravenous Every 8 hours 04/07/21 1010 04/07/21 1451   04/07/21 1000  cefTRIAXone (ROCEPHIN) 2 g in sodium chloride 0.9 % 100 mL IVPB  Status:  Discontinued        2 g 200 mL/hr over 30 Minutes Intravenous Every 12 hours 04/07/21 0931 04/07/21 0955   04/03/21 2200  vancomycin (VANCOREADY)  IVPB 1750 mg/350 mL  Status:  Discontinued        1,750 mg 175 mL/hr over 120 Minutes Intravenous Every 24 hours 04/03/21 0850 04/04/21 0951   04/03/21 1000  vancomycin (VANCOREADY) IVPB 1500 mg/300 mL        1,500 mg 150 mL/hr over 120 Minutes Intravenous  Once 04/03/21 0850 04/03/21 1416   04/03/21 0900  ceFEPIme (MAXIPIME) 2 g in sodium chloride 0.9 % 100 mL IVPB  Status:  Discontinued        2 g 200 mL/hr over 30 Minutes Intravenous Every 8 hours 04/03/21 0850 04/07/21 0857   03/31/21 0200  cefTRIAXone (ROCEPHIN) 2 g in sodium chloride 0.9 % 100 mL IVPB  Status:  Discontinued        2 g 200 mL/hr over 30 Minutes Intravenous Every 24 hours 03/30/21 2051 04/03/21 0823   03/30/21 1630  azithromycin (ZITHROMAX) 500 mg in sodium chloride 0.9 % 250 mL IVPB  Status:  Discontinued        500 mg 250 mL/hr over 60  Minutes Intravenous Every 24 hours 03/30/21 1619 04/03/21 0823   03/30/21 1630  ceFEPIme (MAXIPIME) 2 g in sodium chloride 0.9 % 100 mL IVPB        2 g 200 mL/hr over 30 Minutes Intravenous  Once 03/30/21 1625 03/30/21 1724       Medications: Scheduled Meds:  carvedilol  12.5 mg Oral BID WC   Chlorhexidine Gluconate Cloth  6 each Topical Daily   enoxaparin (LOVENOX) injection  40 mg Subcutaneous Q24H   lactose free nutrition  237 mL Oral TID WC   mouth rinse  15 mL Mouth Rinse BID   melatonin  3 mg Oral QHS   multivitamin with minerals  1 tablet Oral Daily   Ensure Max Protein  11 oz Oral BID   sacubitril-valsartan  1 tablet Oral BID   sodium chloride flush  3 mL Intravenous Q12H   Continuous Infusions: PRN Meds:.acetaminophen **OR** acetaminophen (TYLENOL) oral liquid 160 mg/5 mL, acetaminophen **OR** acetaminophen, albuterol, fentaNYL (SUBLIMAZE) injection, fentaNYL (SUBLIMAZE) injection, guaiFENesin-dextromethorphan, hydrALAZINE, hydrOXYzine, meperidine (DEMEROL) injection, ondansetron **OR** ondansetron (ZOFRAN) IV, ondansetron (ZOFRAN) IV, ondansetron (ZOFRAN) IV,  oxyCODONE **OR** oxyCODONE, oxyCODONE **OR** oxyCODONE, senna-docusate, sodium chloride, sodium chloride flush    Objective: Weight change: 1.3 kg  Intake/Output Summary (Last 24 hours) at 04/10/2021 1233 Last data filed at 04/10/2021 0300 Gross per 24 hour  Intake 380 ml  Output 50 ml  Net 330 ml   Blood pressure 138/78, pulse 84, temperature 97.9 F (36.6 C), temperature source Oral, resp. rate (!) 21, height 5' 6.14" (1.68 m), weight 88.3 kg, SpO2 98 %. Temp:  [97.9 F (36.6 C)-98.5 F (36.9 C)] 97.9 F (36.6 C) (11/24 0928) Pulse Rate:  [82-105] 84 (11/24 0928) Resp:  [12-32] 21 (11/24 0624) BP: (123-161)/(66-83) 138/78 (11/24 0928) SpO2:  [94 %-98 %] 98 % (11/24 0928) Weight:  [88.3 kg] 88.3 kg (11/24 0500)  Physical Exam: Physical Exam Constitutional:      General: She is not in acute distress.    Appearance: She is well-developed. She is not diaphoretic.  HENT:     Head: Normocephalic and atraumatic.     Right Ear: External ear normal.     Left Ear: External ear normal.  Eyes:     General: No scleral icterus.    Extraocular Movements: Extraocular movements intact.     Conjunctiva/sclera: Conjunctivae normal.     Pupils: Pupils are equal, round, and reactive to light.  Cardiovascular:     Rate and Rhythm: Normal rate and regular rhythm.  Pulmonary:     Effort: Pulmonary effort is normal. No respiratory distress.     Breath sounds: No wheezing.  Abdominal:     General: There is no distension.     Palpations: Abdomen is soft.  Musculoskeletal:        General: No tenderness. Normal range of motion.  Lymphadenopathy:     Cervical: No cervical adenopathy.  Skin:    General: Skin is warm and dry.     Coloration: Skin is not pale.     Findings: No erythema or rash.  Neurological:     General: No focal deficit present.     Mental Status: She is alert and oriented to person, place, and time.     Motor: No abnormal muscle tone.     Coordination: Coordination  normal.  Psychiatric:        Mood and Affect: Mood is depressed.        Behavior: Behavior normal.  Thought Content: Thought content normal.        Judgment: Judgment normal.     CBC:    BMET Recent Labs    04/09/21 0240 04/10/21 0339  NA 136 135  K 3.8 4.2  CL 99 98  CO2 34* 34*  GLUCOSE 89 161*  BUN 10 10  CREATININE 0.59 0.61  CALCIUM 8.0* 7.8*     Liver Panel  Recent Labs    04/09/21 0240 04/10/21 0339  PROT 8.1 8.3*  ALBUMIN 2.0* 2.1*  AST 65* 84*  ALT 24 28  ALKPHOS 96 169*  BILITOT 0.2* 0.5       Sedimentation Rate No results for input(s): ESRSEDRATE in the last 72 hours. C-Reactive Protein No results for input(s): CRP in the last 72 hours.  Micro Results: Recent Results (from the past 720 hour(s))  Blood culture (routine x 2)     Status: None   Collection Time: 03/28/21  5:32 PM   Specimen: BLOOD  Result Value Ref Range Status   Specimen Description   Final    BLOOD LEFT ANTECUBITAL Performed at [facility] [address], [place], [place] [address]    Special Requests   Final    BOTTLES DRAWN AEROBIC AND ANAEROBIC Blood Culture results may not be optimal due to an inadequate volume of blood received in culture bottles Performed at [facility] [address], [place], [place] [address]    Culture   Final    NO GROWTH 5 DAYS Performed at [facility], [place] [address] [place], [place] [address]    Report Status 04/02/2021 FINAL  Final  Resp Panel by RT-PCR (Flu A&B, Covid) Nasopharyngeal Swab     Status: None   Collection Time: 03/28/21  5:32 PM   Specimen: Nasopharyngeal Swab; Nasopharyngeal(NP) swabs in vial transport medium  Result Value Ref Range Status   SARS Coronavirus 2 by RT PCR NEGATIVE NEGATIVE Final    Comment: (NOTE) SARS-CoV-2 target nucleic acids are NOT DETECTED.  The SARS-CoV-2 RNA is generally detectable in upper respiratory specimens during the acute phase of  infection. The lowest concentration of SARS-CoV-2 viral copies this assay can detect is 138 copies/mL. A negative result does not preclude SARS-Cov-2 infection and should not be used as the sole basis for treatment or other patient management decisions. A negative result may occur with  improper specimen collection/handling, submission of specimen other than nasopharyngeal swab, presence of viral mutation(s) within the areas targeted by this assay, and inadequate number of viral copies(<138 copies/mL). A negative result must be combined with clinical observations, patient history, and epidemiological information. The expected result is Negative.  Fact Sheet for Patients:  EntrepreneurPulse.com.au  Fact Sheet for Healthcare Providers:  IncredibleEmployment.be  This test is no t yet approved or cleared by the [place] FDA and  has been authorized for detection and/or diagnosis of SARS-CoV-2 by FDA under an Emergency Use Authorization (EUA). This EUA will remain  in effect (meaning this test can be used) for the duration of the COVID-19 declaration under Section 564(b)(1) of the Act, 21 U.S.C.section 360bbb-3(b)(1), unless the authorization is terminated  or revoked sooner.       Influenza A by PCR NEGATIVE NEGATIVE Final   Influenza B by PCR NEGATIVE NEGATIVE Final    Comment: (NOTE) The Xpert Xpress SARS-CoV-2/FLU/RSV plus assay is intended as an aid in the diagnosis of influenza from Nasopharyngeal swab specimens and should not be used as a sole  basis for treatment. Nasal washings and aspirates are unacceptable for Xpert Xpress SARS-CoV-2/FLU/RSV testing.  Fact Sheet for Patients: EntrepreneurPulse.com.au  Fact Sheet for Healthcare Providers: IncredibleEmployment.be  This test is not yet approved or cleared by the [place] FDA and has been authorized for detection and/or diagnosis of SARS-CoV-2  by FDA under an Emergency Use Authorization (EUA). This EUA will remain in effect (meaning this test can be used) for the duration of the COVID-19 declaration under Section 564(b)(1) of the Act, 21 U.S.C. section 360bbb-3(b)(1), unless the authorization is terminated or revoked.  Performed at [facility], [place] [address], [place], [place] [address]   Blood culture (routine x 2)     Status: None   Collection Time: 03/28/21  5:37 PM   Specimen: BLOOD  Result Value Ref Range Status   Specimen Description   Final    BLOOD BLOOD LEFT WRIST Performed at [facility] [address], [place], [place] [address]    Special Requests   Final    BOTTLES DRAWN AEROBIC AND ANAEROBIC Blood Culture results may not be optimal due to an inadequate volume of blood received in culture bottles Performed at [facility] [address], [place], [place] [address]    Culture   Final    NO GROWTH 5 DAYS Performed at [facility], [place] [address], [place], [place] [address]    Report Status 04/02/2021 FINAL  Final  Resp Panel by RT-PCR (Flu A&B, Covid) Nasopharyngeal Swab     Status: None   Collection Time: 03/30/21  2:39 PM   Specimen: Nasopharyngeal Swab; Nasopharyngeal(NP) swabs in vial transport medium  Result Value Ref Range Status   SARS Coronavirus 2 by RT PCR NEGATIVE NEGATIVE Final    Comment: (NOTE) SARS-CoV-2 target nucleic acids are NOT DETECTED.  The SARS-CoV-2 RNA is generally detectable in upper respiratory specimens during the acute phase of infection. The lowest concentration of SARS-CoV-2 viral copies this assay can detect is 138 copies/mL. A negative result does not preclude SARS-Cov-2 infection and should not be used as the sole basis for treatment or other patient management decisions. A negative result may occur with  improper specimen collection/handling, submission of specimen other than nasopharyngeal swab,  presence of viral mutation(s) within the areas targeted by this assay, and inadequate number of viral copies(<138 copies/mL). A negative result must be combined with clinical observations, patient history, and epidemiological information. The expected result is Negative.  Fact Sheet for Patients:  EntrepreneurPulse.com.au  Fact Sheet for Healthcare Providers:  IncredibleEmployment.be  This test is no t yet approved or cleared by the [place] FDA and  has been authorized for detection and/or diagnosis of SARS-CoV-2 by FDA under an Emergency Use Authorization (EUA). This EUA will remain  in effect (meaning this test can be used) for the duration of the COVID-19 declaration under Section 564(b)(1) of the Act, 21 U.S.C.section 360bbb-3(b)(1), unless the authorization is terminated  or revoked sooner.       Influenza A by PCR NEGATIVE NEGATIVE Final   Influenza B by PCR NEGATIVE NEGATIVE Final    Comment: (NOTE) The Xpert Xpress SARS-CoV-2/FLU/RSV plus assay is intended as an aid in the diagnosis of influenza from Nasopharyngeal swab specimens and should not be used as a sole basis for treatment. Nasal washings and aspirates are unacceptable for Xpert Xpress SARS-CoV-2/FLU/RSV testing.  Fact Sheet for Patients: EntrepreneurPulse.com.au  Fact Sheet for Healthcare Providers: IncredibleEmployment.be  This test is not yet  approved or cleared by the [place] and has been authorized for detection and/or diagnosis of SARS-CoV-2 by FDA under an Emergency Use Authorization (EUA). This EUA will remain in effect (meaning this test can be used) for the duration of the COVID-19 declaration under Section 564(b)(1) of the Act, 21 U.S.C. section 360bbb-3(b)(1), unless the authorization is terminated or revoked.  Performed at [facility], [place] [address] [place], [place] [address]    Culture, blood (Routine x 2)     Status: None   Collection Time: 03/30/21  3:20 PM   Specimen: BLOOD  Result Value Ref Range Status   Specimen Description   Final    BLOOD PORTA CATH Performed at [facility] [address], [place], [place] [address]    Special Requests   Final    BOTTLES DRAWN AEROBIC AND ANAEROBIC Blood Culture adequate volume Performed at [facility] [address], [place], [place] [address]    Culture   Final    NO GROWTH 5 DAYS Performed at [facility], [place] [address], [place], [place] [address]    Report Status 04/04/2021 FINAL  Final  Culture, blood (Routine x 2)     Status: None   Collection Time: 03/30/21  3:27 PM   Specimen: BLOOD  Result Value Ref Range Status   Specimen Description   Final    BLOOD LEFT ANTECUBITAL Performed at [facility] [address] [place], [place] [address]    Special Requests   Final    BOTTLES DRAWN AEROBIC AND ANAEROBIC Blood Culture results may not be optimal due to an excessive volume of blood received in culture bottles Performed at [facility] [address], [place], [place] [address]    Culture   Final    NO GROWTH 5 DAYS Performed at [facility], [place] [address] [place], [place] [address]    Report Status 04/04/2021 FINAL  Final  Culture, blood (Routine X 2) w Reflex to ID Panel     Status: None   Collection Time: 04/02/21  6:26 PM   Specimen: BLOOD  Result Value Ref Range Status   Specimen Description   Final    BLOOD BLOOD LEFT HAND Performed at [facility] [address] [place], [place] [address]    Special Requests   Final    BOTTLES DRAWN AEROBIC ONLY Blood Culture adequate volume Performed at [facility] [address], [place], [place] [address]    Culture   Final    NO GROWTH 5 DAYS Performed at [facility], [place] [address] [place], [place]  [address]    Report Status 04/07/2021 FINAL  Final  Culture, blood (Routine X 2) w Reflex to ID Panel     Status: None   Collection Time: 04/02/21  7:43 PM   Specimen: BLOOD LEFT WRIST  Result Value Ref Range Status   Specimen Description   Final    BLOOD LEFT WRIST Performed at [facility] [address], [place], [place] [address]    Special Requests   Final    BOTTLES DRAWN AEROBIC ONLY Blood Culture adequate volume Performed at [facility] [address], [place], [place] [address]    Culture   Final    NO GROWTH 5 DAYS Performed at [facility], [place] [address], [place],  [address]    Report Status  04/07/2021 FINAL  Final  MRSA Next Gen by PCR, Nasal     Status: None   Collection Time: 04/03/21 11:26 AM   Specimen: Nasal Mucosa; Nasal Swab  Result Value Ref Range Status   MRSA by PCR Next Gen NOT DETECTED NOT DETECTED Final    Comment: (NOTE) The GeneXpert MRSA Assay (FDA approved for NASAL specimens only), is one component of a comprehensive MRSA colonization surveillance program. It is not intended to diagnose MRSA infection nor to guide or monitor treatment for MRSA infections. Test performance is not FDA approved in patients less than 64 years old. Performed at [facility], [place] [address] [place], [place] [address]   Culture, blood (routine x 2)     Status: None (Preliminary result)   Collection Time: 04/05/21  3:24 PM   Specimen: BLOOD  Result Value Ref Range Status   Specimen Description   Final    BLOOD BLOOD LEFT HAND Performed at [facility] [address] [place], [place] [address]    Special Requests   Final    BOTTLES DRAWN AEROBIC ONLY Blood Culture adequate volume Performed at [facility] [address] [place], [place] [address]    Culture   Final    NO GROWTH 4 DAYS Performed at [facility], [place] [address], [place], [place]  [address]    Report Status PENDING  Incomplete  Culture, blood (routine x 2)     Status: None (Preliminary result)   Collection Time: 04/05/21  3:24 PM   Specimen: BLOOD  Result Value Ref Range Status   Specimen Description   Final    BLOOD BLOOD LEFT WRIST Performed at [facility] [address], [place], [place] [address]    Special Requests   Final    BOTTLES DRAWN AEROBIC ONLY Blood Culture adequate volume Performed at [facility] [address], [place], [place] [address]    Culture   Final    NO GROWTH 4 DAYS Performed at [facility], [place] [address] [place], [place] [address]    Report Status PENDING  Incomplete  Expectorated Sputum Assessment w Gram Stain, Rflx to Resp Cult     Status: None   Collection Time: 04/06/21 10:00 AM   Specimen: Expectorated Sputum  Result Value Ref Range Status   Specimen Description EXPECTORATED SPUTUM  Final   Special Requests NONE  Final   Sputum evaluation   Final    THIS SPECIMEN IS ACCEPTABLE FOR SPUTUM CULTURE Performed at [facility], [place] [address] [place], [place] [address]    Report Status 04/06/2021 FINAL  Final  Culture, Respiratory w Gram Stain     Status: None   Collection Time: 04/06/21 10:00 AM  Result Value Ref Range Status   Specimen Description   Final    EXPECTORATED SPUTUM Performed at [facility], [place] [address] [place], [place] [address]    Special Requests   Final    NONE Reflexed from [id number] Performed at [facility], [place] [address] [place], [place] [address]    Gram Stain   Final    FEW SQUAMOUS EPITHELIAL CELLS PRESENT FEW WBC SEEN FEW YEAST MODERATE GRAM POSITIVE RODS MODERATE GRAM POSITIVE COCCI    Culture   Final    ABUNDANT Normal respiratory flora-no Staph aureus or Pseudomonas seen Performed at [facility], [address], [place], [place] [address]  Report Status 04/08/2021 FINAL   Final  Respiratory (~20 pathogens) panel by PCR     Status: None   Collection Time: 04/07/21  9:29 AM   Specimen: Nasopharyngeal Swab; Respiratory  Result Value Ref Range Status   Adenovirus NOT DETECTED NOT DETECTED Final   Coronavirus 229E NOT DETECTED NOT DETECTED Final    Comment: (NOTE) The Coronavirus on the Respiratory Panel, DOES NOT test for the novel  Coronavirus (2019 nCoV)    Coronavirus HKU1 NOT DETECTED NOT DETECTED Final   Coronavirus NL63 NOT DETECTED NOT DETECTED Final   Coronavirus OC43 NOT DETECTED NOT DETECTED Final   Metapneumovirus NOT DETECTED NOT DETECTED Final   Rhinovirus / Enterovirus NOT DETECTED NOT DETECTED Final   Influenza A NOT DETECTED NOT DETECTED Final   Influenza B NOT DETECTED NOT DETECTED Final   Parainfluenza Virus 1 NOT DETECTED NOT DETECTED Final   Parainfluenza Virus 2 NOT DETECTED NOT DETECTED Final   Parainfluenza Virus 3 NOT DETECTED NOT DETECTED Final   Parainfluenza Virus 4 NOT DETECTED NOT DETECTED Final   Respiratory Syncytial Virus NOT DETECTED NOT DETECTED Final   Bordetella pertussis NOT DETECTED NOT DETECTED Final   Bordetella Parapertussis NOT DETECTED NOT DETECTED Final   Chlamydophila pneumoniae NOT DETECTED NOT DETECTED Final   Mycoplasma pneumoniae NOT DETECTED NOT DETECTED Final    Comment: Performed at [facility], [place] [address] [place], [place] [address]  CSF culture w Stat Gram Stain     Status: None (Preliminary result)   Collection Time: 04/07/21  2:28 PM   Specimen: CSF; Cerebrospinal Fluid  Result Value Ref Range Status   Specimen Description   Final    CSF Performed at [facility] [address], [name], [place] [address]    Special Requests   Final    NONE Performed at [facility], [place] [address] [place], [place] [address]    Gram Stain   Final    NO WBC SEEN NO ORGANISMS SEEN CYTOSPIN SMEAR Gram Stain Report Called to,Read Back By and Verified With:  R.MICHAELS, RN AT 1850 ON 11.21.22 BY N.THOMPSON Performed at [place] [address] [place], [place] [address]    Culture   Final    NO GROWTH 3 DAYS Performed at [facility], [place] [address], [place], [place] [address]    Report Status PENDING  Incomplete  Pneumocystis smear by DFA     Status: None   Collection Time: 04/09/21  2:49 PM   Specimen: Bronchial Alveolar Lavage; Respiratory  Result Value Ref Range Status   Specimen Source-PJSRC BRONCHIAL ALVEOLAR LAVAGE  Final    Comment: RUL   Pneumocystis jiroveci Ag NEGATIVE  Final    Comment: Performed at [facility], [place] [address] [place], [place] [address]  Culture, Respiratory w Gram Stain     Status: None (Preliminary result)   Collection Time: 04/09/21  2:49 PM   Specimen: Bronchial Alveolar Lavage; Respiratory  Result Value Ref Range Status   Specimen Description   Final    BRONCHIAL ALVEOLAR LAVAGE RUL Performed at [facility] [address], [place], [place] [address]    Special Requests   Final    NONE Performed at [facility], [place] [address], [place], [place] [address]    Gram Stain   Final    FEW WBC PRESENT, PREDOMINANTLY MONONUCLEAR NO ORGANISMS SEEN    Culture   Final  NO GROWTH < 24 HOURS Performed at [facility] [address], [place], [place] [address]    Report Status PENDING  Incomplete  Anaerobic culture w Gram Stain     Status: None (Preliminary result)   Collection Time: 04/09/21  2:49 PM   Specimen: Bronchial Alveolar Lavage; Respiratory  Result Value Ref Range Status   Specimen Description   Final    BRONCHIAL ALVEOLAR LAVAGE RUL Performed at [facility] [address], [place], [place] [address]    Special Requests   Final    NONE Performed at [facility], [place] [address], [place], [place] [address]    Gram Stain   Final    FEW WBC PRESENT,  PREDOMINANTLY MONONUCLEAR NO ORGANISMS SEEN Performed at [facility], [place] [address] [place],  [address]    Culture PENDING  Incomplete   Report Status PENDING  Incomplete    Studies/Results: MR BRAIN W WO CONTRAST  Result Date: 04/08/2021 CLINICAL DATA:  Transient ischemic attack (TIA); acute stroke, follow up Neuro deficit, acute, stroke suspected EXAM: MRI HEAD WITHOUT AND WITH CONTRAST TECHNIQUE: Multiplanar, multiecho pulse sequences of the brain and surrounding structures were obtained without and with intravenous contrast. CONTRAST:  34m GADAVIST GADOBUTROL 1 MMOL/ML IV SOLN COMPARISON:  04/06/2021 FINDINGS: Motion artifact is present. Brain: There is no acute infarction or intracranial hemorrhage. There is no intracranial mass, mass effect, or edema. There is no hydrocephalus or extra-axial fluid collection. Ventricles and sulci are normal in size and configuration. Patchy foci of T2 hyperintensity in the supratentorial white matter are nonspecific but may reflect stable chronic microvascular ischemic changes. No definite abnormal enhancement. Vascular: Major vessel flow voids at the skull base are preserved. Skull and upper cervical spine: Normal marrow signal is preserved. Sinuses/Orbits: Mild patchy mucosal thickening. Orbits are unremarkable. Other: Sella is unremarkable.  Mastoid air cells are clear. IMPRESSION: Motion degraded. No acute abnormality.  No definite abnormal enhancement. Electronically Signed   By: PMacy MisM.D.   On: 04/08/2021 19:39   DG Chest Port 1 View  Result Date: 04/09/2021 CLINICAL DATA:  Status post bronchoscopy, previous abnormal chest x-ray EXAM: PORTABLE CHEST 1 VIEW COMPARISON:  04/05/2021 FINDINGS: Single frontal view of the chest demonstrates stable left chest wall port. Cardiac silhouette is unremarkable. Progression of the consolidation within the right upper lobe, likely representing post bronchoscopy changes. This could reflect  edema or hemorrhage. The multifocal ground-glass airspace disease seen elsewhere throughout the lungs is stable. No large effusion or pneumothorax. No acute bony abnormalities. IMPRESSION: 1. Persistent bilateral ground-glass airspace disease consistent with edema or infection. Increased density in the right upper lobe likely represents post bronchoscopy changes, either lavage fluid or hemorrhage. 2. No evidence of pneumothorax. Electronically Signed   By: MRanda NgoM.D.   On: 04/09/2021 16:19   EEG adult  Result Date: 04/08/2021 YLora Havens MD     04/08/2021  1:50 PM Patient Name: NSkyelar HallidayMRN: [id number] Attending: PLora HavensReferring Physician/Provider: KGardiner Barefoot NP Date: 04/08/2021 Duration: 24.15 mins Patient history: 65year old female with right breast cancer on chemotherapy who presented with altered mental status.  EEG to evaluate for seizure. Level of alertness: Awake AEDs during EEG study: None Technical aspects: This EEG study was done with scalp electrodes positioned according to the 10-20 International system of electrode placement. Electrical activity was acquired at a sampling rate of _0  and reviewed with a high frequency filter  of _0  and a low frequency filter of _1 . EEG data were recorded continuously and digitally stored. Description: The posterior dominant rhythm consists of _2  activity of moderate voltage (25-35 uV) seen predominantly in posterior head regions, symmetric and reactive to eye opening and eye closing. EEG showed intermittent generalized polymorphic 3 to 6 Hz theta-delta slowing, at times with triphasic morphology. Hyperventilation and photic stimulation were not performed.   ABNORMALITY - Intermittent slow, generalized IMPRESSION: This study is suggestive of mild diffuse encephalopathy, nonspecific etiology. No seizures or epileptiform discharges were seen throughout the recording. Marie Church   DG C-ARM  BRONCHOSCOPY  Result Date: 04/09/2021 C-ARM BRONCHOSCOPY: Fluoroscopy was utilized by the requesting physician.  No radiographic interpretation.      Assessment/Plan:  INTERVAL HISTORY: Bronchoscopy report seems relatively benign in terms of findings within the airways cultures have been unrevealing   Principal Problem:   Pneumonitis Active Problems:   Anemia   Mediastinal lymphadenopathy   Malignant neoplasm of upper-outer quadrant of right breast in female, estrogen receptor negative (HCC)   HFrEF (heart failure with reduced ejection fraction) (HCC)   Hyponatremia   Thrombocytopenia (HCC)   Acute respiratory failure (HCC)   Malnutrition of moderate degree   Acute metabolic encephalopathy    Marie Church is a 65 y.o. female with metastatic breast cancer who was admitted for pneumonia with multifocal infiltrates.  She was to be receiving broad-spectrum antibiotics but still having intermittent fevers.  CT of the chest that showed patchy bilateral infiltrates.  She had underwent lumbar puncture which was benign.  Dr. Baxter Flattery had narrowed her to ceftriaxone.  She then underwent bronchoscopy.  The bronchoscopy report describes relatively normal airways without purulence.  Sputum cultures and BAL cultures are unremarkable so far. PCP DFA is negative.  Fungal cultures and Aspergillus antigen pending she has an AFB culture from sputum but I do not see that the BAL was sent for AFB culture.  Biopsies were also taken.  I am happy to make her an appt in ID clinic to review micro findings.  AFB cultures will take 6 weeks to finalize    I am discontinuing antibacterial antibiotics today.  CCM is considering course of steroids.       LOS: 10 days   Alcide Evener 04/10/2021, 12:33 PM

## 2021-04-10 NOTE — Progress Notes (Signed)
NAME:  Marie Church, MRN:  [id number], DOB:  13-Mar-1956, LOS: 10 ADMISSION DATE:  03/30/2021, CONSULTATION DATE:  04/07/21 REFERRING MD:  Karleen Hampshire, CHIEF COMPLAINT:  fatigue   History of Present Illness:  65yF with history of R breast cancer with axillary node involvement on active kadcyla/XRT, HFrecoveredEF, DM2, HTN, anemia/thrombocytopenia who presented to ED with dyspnea, weakness. Her workup was initially remarkable for diffuse bilateral ggo and she was started on ceftriaxone and azithromycin for CAP coverage. She was persistently febrile and on 11/17 was changed to vancomycin, cefepime. Negative MRSA swab so vanc discontinued. She is on cefepime day 4 but continues to be febrile and CT A/P 11/20 shows likely progression of same process present on 11/13. She tells me she feels tired and that she's coughing up whitish sputum.  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 on active kadcyla/XRT HFrecoveredEF DM2 HTN  Significant Hospital Events: Including procedures, antibiotic start and stop dates in addition to other pertinent events   11/13 admitted, eventually completed 4 day course of ceftriaxone/azithromycin 11/17 broadened to vanc/cefepime and currently on day 4 of cefepime, today primary team has switched to meropenem 11/23 bronchoscopy with EBUS, transbronchial biopsy right upper lobe and TB NA of station 7 and 4R >> bleeding postprocedure  Interim History / Subjective:  No significant hemoptysis Afebrile 98% on 2 L oxygen  Objective   Blood pressure 138/78, pulse 84, temperature 97.9 F (36.6 C), temperature source Oral, resp. rate (!) 21, height 5' 6.14" (1.68 m), weight 88.3 kg, SpO2 98 %.        Intake/Output Summary (Last 24 hours) at 04/10/2021 1143 Last data filed at 04/10/2021 0300 Gross per 24 hour  Intake 380 ml  Output 50 ml  Net 330 ml    Filed Weights   04/08/21 0550 04/09/21 0500 04/10/21 0500  Weight: 84.4 kg 87 kg  88.3 kg    Examination: General appearance: 65 y.o., female, drowsy but arousable Eyes: anicteric sclerae,trackign HENT: NCAT; dry MM Neck: Trachea midline; no lymphadenopathy, no JVD Lungs: No accessory muscle use, decreased breath sounds on right, no crackles or rhonchi CV: RRR, no MRGs  Abdomen: Soft, non-tender; non-distended, BS present  Extremities: No peripheral edema, radial and DP pulses present bilaterally  Skin: Normal temperature, turgor and texture; no rash Psych: Appropriate affect Neuro: Alert and interactive, nonfocal   Labs show no leukocytosis, stable anemia, normal electrolytes except for low phosphate  Resolved Hospital Problem list     Assessment & Plan:   # Acute hypoxic and acute on chronic hypercapnic respiratory failure # Widespread ggo with almost nodular appearance centered on bronchovascular bundles # Persistent fever Multifocal pneumonia from resistant typical or atypical bacteria, organizing pneumonia/pneumonitis from kadcyla/radiation, lymphangitic spread of cancer are considerations.  - urine legionella, strep neg, BCx NGTD - switched to meropenem given lack of improvement, worsening infiltrate -Await bronchoscopy data, preliminary BAL culture appears negative and PJP negative , can consider empiric steroids for drug-induced pneumonitis if transbronchial biopsies and TB NA negative for cancer, since no CNS infection identified  # Acute toxic metabolic encephalopathy: Sepsis from pulmonary source, alternatively consider meningitis/encephalitis -CSF appears clear   # Heart failure with recovered ejection fraction: - GDMT per [facility]  # R breast cancer with axillary node involvement - on active treatment with kadcyla and radiation   Best Practice (right click and "Reselect all SmartList Selections" daily)   Per [facility]  Labs   CBC: Recent Labs  Lab 04/05/21 0459  04/06/21 0009 04/07/21 1519 04/09/21 0240 04/10/21 0339  WBC 4.5 4.3 4.2 3.9*  5.6  NEUTROABS  --   --  1.9  --   --   HGB 8.7* 8.2* 8.7* 8.0* 8.2*  HCT 28.6* 27.3* 29.4* 26.9* 27.4*  MCV 87.2 86.1 88.3 87.6 87.3  PLT 120* 114* 88* 73* 75*     Basic Metabolic Panel: Recent Labs  Lab 04/05/21 0459 04/06/21 0009 04/07/21 0100 04/07/21 1519 04/09/21 0240 04/10/21 0339  NA 135 131*  --  131* 136 135  K 3.9 4.0  --  4.5 3.8 4.2  CL 100 95*  --  96* 99 98  CO2 32 33*  --  32 34* 34*  GLUCOSE 102* 111*  --  93 89 161*  BUN 9 7*  --  _0 CREATININE 0.51 0.49 0.76 0.63 0.59 0.61  CALCIUM 8.4* 8.1*  --  8.0* 8.0* 7.8*  MG  --   --   --   --   --  1.9  PHOS  --   --   --   --   --  1.9*    GFR: Estimated Creatinine Clearance: 80.8 mL/min (by C-G formula based on SCr of 0.61 mg/dL). Recent Labs  Lab 04/05/21 1524 04/06/21 0009 04/07/21 1519 04/09/21 0240 04/10/21 0339  PROCALCITON 0.18  --   --   --   --   WBC  --  4.3 4.2 3.9* 5.6     Liver Function Tests: Recent Labs  Lab 04/07/21 1519 04/09/21 0240 04/10/21 0339  AST 52* 65* 84*  ALT _1 ALKPHOS 73 96 169*  BILITOT 0.5 0.2* 0.5  PROT 8.2* 8.1 8.3*  ALBUMIN 2.2* 2.0* 2.1*   No results for input(s): LIPASE, AMYLASE in the last 168 hours. Recent Labs  Lab 04/05/21 1524  AMMONIA 24     ABG    Component Value Date/Time   PHART 7.350 04/07/2021 0410   PCO2ART 63.2 (H) 04/07/2021 0410   PO2ART 93.6 04/07/2021 0410   HCO3 34.3 (H) 04/07/2021 0410   TCO2 39 (H) 10/10/2020 0808   O2SAT 97.9 04/07/2021 0410      Coagulation Profile: No results for input(s): INR, PROTIME in the last 168 hours.  Cardiac Enzymes: No results for input(s): CKTOTAL, CKMB, CKMBINDEX, TROPONINI in the last 168 hours.  HbA1C: Hgb A1c MFr Bld  Date/Time Value Ref Range Status  10/04/2020 01:50 AM 6.4 (H) 4.8 - 5.6 % Final    Comment:    (NOTE) Pre diabetes:          5.7%-6.4%  Diabetes:              >6.4%  Glycemic control for   <7.0% adults with diabetes     CBG: No results for  input(s): GLUCAP in the last 168 hours.   Kara Mead MD. Shade Flood. [facility] Pager : [phone]  If no response to pager , please call [phone] until 7 pm After 7:00 pm call Elink  [phone]   04/10/2021

## 2021-04-10 NOTE — Progress Notes (Signed)
PROGRESS NOTE    Marie Church  TLX:[id number] DOB: 10/24/1955 DOA: 03/30/2021 PCP: System, Provider Not In    Chief Complaint  Patient presents with   Pneumonia    Brief Narrative:  Marie Church is a 65 y.o. 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 on active Kadcyla and radiation treatment, HFrEF (EF improved from 20-25% to 50%), T2DM, HTN, anemia/thrombocytopenia who presented to the ED for evaluation of shortness of breath  Subjective:  S/p  bronchoscopy yesterday  , feeling stronger today, there is no confusion, she denies cough, no chest pain, no edema, she wants something to calm down her nerves but she does not want something too strong  Has poor appetite, desires appetite stimulation  son at bedside   Assessment & Plan:   Principal Problem:   Pneumonitis Active Problems:   Anemia   Mediastinal lymphadenopathy   Malignant neoplasm of upper-outer quadrant of right breast in female, estrogen receptor negative (HCC)   HFrEF (heart failure with reduced ejection fraction) (HCC)   Hyponatremia   Thrombocytopenia (HCC)   Acute respiratory failure (HCC)   Malnutrition of moderate degree   Acute metabolic encephalopathy   Community acquired pneumonia   Lung mass   Acute respiratory failure with hypoxia, sepsis secondary to bilateral pneumonia present on admission, failed outpatient treatment for pneumonia. -Differentials include radiation pneumonitis vs metastatic disease from breast cancer.  -Sputum culture no growth, AFB pending, gram stain no WBC or organism. Vancomycin cefepime and acyclovir stopped and Rocephin started on 04/08/2021 -CT angiogram of the chest on 11/13 negative for PE, but showed diffuse ground glass opacities which is persistent on repeat ct chest on 11/21 -COVID-19 PCR, influenza PCR is negative, urine for strep pneumonia is negative,.blood cultures negative till date.  --  ABG showed hypercapnia, BIPAP was used with some improvement in her mental status.  - PCCM and ID following.  Scheduled for bronchoscopy on 04/09/2021   Acute toxic /metabolic encephalopathy Differential include from worsening pneumonia vs meningitis/ encephalitis/hypercapnia MRI brain without contrast is negative for acute intracranial abnormality. Ammonia level is 24, TSH is within normal limits Abg with mild co2 retention Neurology consulted, mri brain no acute findings, eeg unremarkable, continue treating underline metabolic cause, neurology singed off Confusion appears has resolved    Aspiration risk mild  Initially speech recommends thin liquid with small sips and bites. Confusion appear has resolved, appear tolerating regular diet   Hyponatremia Suspect hypovolemic hyponatremia in the setting of dehydration. Improved, Continue to monitor.  Hypophosphatemia Replace phose    Chronic systolic heart failure/history of nonischemic cardiomyopathy. Echocardiogram on 03/03/2021 showed improvement in left ventricular ejection fraction to 50% compared to the previous echo showing left ventricular ejection fraction at 20 to 25%. Continue with Coreg at 12.5 mg twice daily, Entresto 24-26 mg twice daily. Holding Lasix and spironolactone for now.       Hypotension she was given a 500 cc of normal saline bolus.  Lasix and Aldactone were on hold.  She is on Coreg and Entresto monitor closely.   Metastatic right-sided breast cancer with axillary node involvement Patient follows up with [facility] oncology, currently on active chemotherapy with Kadcyla with last treatment on 03/19/2021 Patient also undergoing radiation treatment through radiation oncology at [facility]. She has one more week of radiation treatment.  She has not received any radiation this week as the radiation machine has been not working.   Anemia/thrombocytopenia Probably secondary to chemotherapy Continue to monitor  counts. Hemoglobin stable around 8 and platelet counts are improving  Anxiety She wants to try one does benadryl now, she wants to continue hydroxyzine at night, she thinks hydroxyzine is too strong for her during the day  Start remeron for appetite   Nutritional Assessment:  The patient's BMI is: Body mass index is 31.29 kg/m.Marland Kitchen  Seen by dietician.  I agree with the assessment and plan as outlined below:  Nutrition Status: Nutrition Problem: Moderate Malnutrition Etiology: chronic illness, cancer and cancer related treatments Signs/Symptoms: mild fat depletion, mild muscle depletion Interventions: Boost Plus, MVI, Premier Protein  .     Unresulted Labs (From admission, onward)     Start     Ordered   04/11/21 0500  CBC with Differential/Platelet  Tomorrow morning,   R       Question:  Specimen collection method  Answer:  IV Team=IV Team collect   04/10/21 1628   04/11/21 0973  Basic metabolic panel  Tomorrow morning,   R       Question:  Specimen collection method  Answer:  IV Team=IV Team collect   04/10/21 1628   04/11/21 0500  Magnesium  Tomorrow morning,   R       Question:  Specimen collection method  Answer:  IV Team=IV Team collect   04/10/21 1628   04/11/21 0500  Phosphorus  Tomorrow morning,   R       Question:  Specimen collection method  Answer:  IV Team=IV Team collect   04/10/21 1628   04/09/21 1452  Aspergillus Ag, BAL/Serum  RELEASE UPON ORDERING,   TIMED       Comments: Specimen C: Phone [phone]         Previous Biopsy:  no Is the patient on airborne/droplet precautions? No           Clinical History:  N/A Copy of Report to:  N/A Specimen Disposition: Cytology     04/09/21 1452   04/09/21 1452  Fungus Culture With Stain  RELEASE UPON ORDERING,   TIMED       Comments: Specimen C: Phone [phone]         Previous Biopsy:  no Is the patient on airborne/droplet precautions? No           Clinical History:  N/A Copy of Report to:  N/A Specimen  Disposition: Cytology     04/09/21 1452   04/07/21 1532  Draw extra clot tube  RELEASE UPON ORDERING,   STAT       Question:  Specimen collection method  Answer:  IV Team=IV Team collect   04/07/21 1532   04/07/21 1532  Fungus Culture With Stain  RELEASE UPON ORDERING,   STAT        04/07/21 1532   04/07/21 1516  Culture, fungus without smear  Once,   R        04/07/21 1522   04/07/21 1516  Anaerobic culture w Gram Stain  Once,   R        04/07/21 1522   04/07/21 1516  Oligoclonal bands, CSF + serm  Once,   TIMED        04/07/21 1522   04/07/21 0933  Acid Fast Smear (AFB)  (AFB smear + Culture w reflexed sensitivities panel)  Once,   R       See Hyperspace for full Linked Orders Report.   04/07/21 0932   04/07/21 0933  Acid Fast Culture with reflexed sensitivities  (AFB smear +  Culture w reflexed sensitivities panel)  Once,   R       See Hyperspace for full Linked Orders Report.   04/07/21 0932              DVT prophylaxis: Place and maintain sequential compression device Start: 04/10/21 1614   Code Status: Full Family Communication: Family at bedside Disposition:   Status is: Inpatient  Dispo: The patient is from: home              Anticipated d/c is to: TBD              Anticipated d/c date is: TBD                Consultants:  Pulmonology ID neurology  Procedures:  bronchoscopy  Antimicrobials:   Anti-infectives (From admission, onward)    Start     Dose/Rate Route Frequency Ordered Stop   04/08/21 2000  meropenem (MERREM) 1 g in sodium chloride 0.9 % 100 mL IVPB  Status:  Discontinued        1 g 200 mL/hr over 30 Minutes Intravenous Every 8 hours 04/08/21 1347 04/08/21 1349   04/08/21 2000  cefTRIAXone (ROCEPHIN) 2 g in sodium chloride 0.9 % 100 mL IVPB  Status:  Discontinued        2 g 200 mL/hr over 30 Minutes Intravenous Every 24 hours 04/08/21 1349 04/10/21 0947   04/08/21 1000  cefTRIAXone (ROCEPHIN) 2 g in sodium chloride 0.9 % 100 mL IVPB   Status:  Discontinued        2 g 200 mL/hr over 30 Minutes Intravenous Every 24 hours 04/07/21 0905 04/07/21 0931   04/07/21 2200  acyclovir (ZOVIRAX) 800 mg in dextrose 5 % 150 mL IVPB  Status:  Discontinued        800 mg 166 mL/hr over 60 Minutes Intravenous Every 8 hours 04/07/21 1451 04/08/21 1341   04/07/21 2000  meropenem (MERREM) 2 g in sodium chloride 0.9 % 100 mL IVPB  Status:  Discontinued        2 g 200 mL/hr over 30 Minutes Intravenous Every 8 hours 04/07/21 1451 04/08/21 1347   04/07/21 1200  acyclovir (ZOVIRAX) 800 mg in dextrose 5 % 150 mL IVPB  Status:  Discontinued        800 mg 166 mL/hr over 60 Minutes Intravenous Every 8 hours 04/07/21 1037 04/07/21 1451   04/07/21 1100  vancomycin (VANCOREADY) IVPB 1000 mg/200 mL  Status:  Discontinued        1,000 mg 200 mL/hr over 60 Minutes Intravenous Every 12 hours 04/07/21 1037 04/08/21 1340   04/07/21 1030  meropenem (MERREM) 2 g in sodium chloride 0.9 % 100 mL IVPB  Status:  Discontinued        2 g 200 mL/hr over 30 Minutes Intravenous Every 8 hours 04/07/21 1010 04/07/21 1451   04/07/21 1000  cefTRIAXone (ROCEPHIN) 2 g in sodium chloride 0.9 % 100 mL IVPB  Status:  Discontinued        2 g 200 mL/hr over 30 Minutes Intravenous Every 12 hours 04/07/21 0931 04/07/21 0955   04/03/21 2200  vancomycin (VANCOREADY) IVPB 1750 mg/350 mL  Status:  Discontinued        1,750 mg 175 mL/hr over 120 Minutes Intravenous Every 24 hours 04/03/21 0850 04/04/21 0951   04/03/21 1000  vancomycin (VANCOREADY) IVPB 1500 mg/300 mL        1,500 mg 150 mL/hr over 120 Minutes Intravenous  Once  04/03/21 0850 04/03/21 1416   04/03/21 0900  ceFEPIme (MAXIPIME) 2 g in sodium chloride 0.9 % 100 mL IVPB  Status:  Discontinued        2 g 200 mL/hr over 30 Minutes Intravenous Every 8 hours 04/03/21 0850 04/07/21 0857   03/31/21 0200  cefTRIAXone (ROCEPHIN) 2 g in sodium chloride 0.9 % 100 mL IVPB  Status:  Discontinued        2 g 200 mL/hr over 30 Minutes  Intravenous Every 24 hours 03/30/21 2051 04/03/21 0823   03/30/21 1630  azithromycin (ZITHROMAX) 500 mg in sodium chloride 0.9 % 250 mL IVPB  Status:  Discontinued        500 mg 250 mL/hr over 60 Minutes Intravenous Every 24 hours 03/30/21 1619 04/03/21 0823   03/30/21 1630  ceFEPIme (MAXIPIME) 2 g in sodium chloride 0.9 % 100 mL IVPB        2 g 200 mL/hr over 30 Minutes Intravenous  Once 03/30/21 1625 03/30/21 1724           Objective: Vitals:   04/10/21 0624 04/10/21 0928 04/10/21 1259 04/10/21 1756  BP: (!) 147/77 138/78 126/69 (!) 153/75  Pulse: 82 84 80 81  Resp: (!) 21  (!) 25 (!) 26  Temp: 98.3 F (36.8 C) 97.9 F (36.6 C) 98.8 F (37.1 C) 98.9 F (37.2 C)  TempSrc: Oral Oral Oral Oral  SpO2: 95% 98% 97% 93%  Weight:      Height:        Intake/Output Summary (Last 24 hours) at 04/10/2021 1947 Last data filed at 04/10/2021 1833 Gross per 24 hour  Intake 930 ml  Output --  Net 930 ml   Filed Weights   04/08/21 0550 04/09/21 0500 04/10/21 0500  Weight: 84.4 kg 87 kg 88.3 kg    Examination:  General exam: alert, following command, but very weak,  Respiratory system: overall diminished, but appear to have improved aeration compared to yesterday, no wheezing, no rhonchi, no rales, slightly tachypneic ,no accessory muscle use Cardiovascular system:  RRR.  Gastrointestinal system: Abdomen is nondistended, soft and nontender.  Normal bowel sounds heard. Central nervous system: Alert and oriented. No focal neurological deficits. Extremities:  no edema Skin: No rashes, lesions or ulcers Psychiatry: Very weak, but appear appropriate currently.     Data Reviewed: I have personally reviewed following labs and imaging studies  CBC: Recent Labs  Lab 04/05/21 0459 04/06/21 0009 04/07/21 1519 04/09/21 0240 04/10/21 0339  WBC 4.5 4.3 4.2 3.9* 5.6  NEUTROABS  --   --  1.9  --   --   HGB 8.7* 8.2* 8.7* 8.0* 8.2*  HCT 28.6* 27.3* 29.4* 26.9* 27.4*  MCV 87.2  86.1 88.3 87.6 87.3  PLT 120* 114* 88* 73* 75*    Basic Metabolic Panel: Recent Labs  Lab 04/05/21 0459 04/06/21 0009 04/07/21 0100 04/07/21 1519 04/09/21 0240 04/10/21 0339  NA 135 131*  --  131* 136 135  K 3.9 4.0  --  4.5 3.8 4.2  CL 100 95*  --  96* 99 98  CO2 32 33*  --  32 34* 34*  GLUCOSE 102* 111*  --  93 89 161*  BUN 9 7*  --  _0 CREATININE 0.51 0.49 0.76 0.63 0.59 0.61  CALCIUM 8.4* 8.1*  --  8.0* 8.0* 7.8*  MG  --   --   --   --   --  1.9  PHOS  --   --   --   --   --  1.9*    GFR: Estimated Creatinine Clearance: 80.8 mL/min (by C-G formula based on SCr of 0.61 mg/dL).  Liver Function Tests: Recent Labs  Lab 04/07/21 1519 04/09/21 0240 04/10/21 0339  AST 52* 65* 84*  ALT _0 ALKPHOS 73 96 169*  BILITOT 0.5 0.2* 0.5  PROT 8.2* 8.1 8.3*  ALBUMIN 2.2* 2.0* 2.1*    CBG: No results for input(s): GLUCAP in the last 168 hours.   Recent Results (from the past 240 hour(s))  Culture, blood (Routine X 2) w Reflex to ID Panel     Status: None   Collection Time: 04/02/21  6:26 PM   Specimen: BLOOD  Result Value Ref Range Status   Specimen Description   Final    BLOOD BLOOD LEFT HAND Performed at [place] [address] [place], [place] [address]    Special Requests   Final    BOTTLES DRAWN AEROBIC ONLY Blood Culture adequate volume Performed at [place] [address] [place] [address], [place], [place] [address]    Culture   Final    NO GROWTH 5 DAYS Performed at [facility], [place] [address], [place], [place] [address]    Report Status 04/07/2021 FINAL  Final  Culture, blood (Routine X 2) w Reflex to ID Panel     Status: None   Collection Time: 04/02/21  7:43 PM   Specimen: BLOOD LEFT WRIST  Result Value Ref Range Status   Specimen Description   Final    BLOOD LEFT WRIST Performed at [facility] [address], [place], [place] [address]    Special Requests   Final    BOTTLES  DRAWN AEROBIC ONLY Blood Culture adequate volume Performed at [facility] [address] [place], [place] [address]    Culture   Final    NO GROWTH 5 DAYS Performed at [facility], [place] [address] [place], [place] [address]    Report Status 04/07/2021 FINAL  Final  MRSA Next Gen by PCR, Nasal     Status: None   Collection Time: 04/03/21 11:26 AM   Specimen: Nasal Mucosa; Nasal Swab  Result Value Ref Range Status   MRSA by PCR Next Gen NOT DETECTED NOT DETECTED Final    Comment: (NOTE) The GeneXpert MRSA Assay (FDA approved for NASAL specimens only), is one component of a comprehensive MRSA colonization surveillance program. It is not intended to diagnose MRSA infection nor to guide or monitor treatment for MRSA infections. Test performance is not FDA approved in patients less than 50 years old. Performed at [facility], [place] [address] [place], [place] [address]   Culture, blood (routine x 2)     Status: None   Collection Time: 04/05/21  3:24 PM   Specimen: BLOOD  Result Value Ref Range Status   Specimen Description   Final    BLOOD BLOOD LEFT HAND Performed at [place] [address] [place], [place] [address]    Special Requests   Final    BOTTLES DRAWN AEROBIC ONLY Blood Culture adequate volume Performed at [place] [address], [place], [place] [address]    Culture   Final    NO GROWTH 5 DAYS Performed at [facility], [place] [address], [place], [place] [address]    Report Status 04/10/2021 FINAL  Final  Culture, blood (routine x 2)     Status: None  Collection Time: 04/05/21  3:24 PM   Specimen: BLOOD  Result Value Ref Range Status   Specimen Description   Final    BLOOD BLOOD LEFT WRIST Performed at [place] [address], [place], [place] [address]    Special Requests   Final    BOTTLES DRAWN AEROBIC ONLY Blood Culture adequate  volume Performed at [place] [address], [place], [place] [address]    Culture   Final    NO GROWTH 5 DAYS Performed at [facility], [place] [address] [place], [place] [address]    Report Status 04/10/2021 FINAL  Final  Expectorated Sputum Assessment w Gram Stain, Rflx to Resp Cult     Status: None   Collection Time: 04/06/21 10:00 AM   Specimen: Expectorated Sputum  Result Value Ref Range Status   Specimen Description EXPECTORATED SPUTUM  Final   Special Requests NONE  Final   Sputum evaluation   Final    THIS SPECIMEN IS ACCEPTABLE FOR SPUTUM CULTURE Performed at [facility], [place] [address] [place], [place] [address]    Report Status 04/06/2021 FINAL  Final  Culture, Respiratory w Gram Stain     Status: None   Collection Time: 04/06/21 10:00 AM  Result Value Ref Range Status   Specimen Description   Final    EXPECTORATED SPUTUM Performed at [facility], [place] [address], [place], [place] [address]    Special Requests   Final    NONE Reflexed from [phone] Performed at [facility], [place] [address] [place], [place] [address]    Gram Stain   Final    FEW SQUAMOUS EPITHELIAL CELLS PRESENT FEW WBC SEEN FEW YEAST MODERATE GRAM POSITIVE RODS MODERATE GRAM POSITIVE COCCI    Culture   Final    ABUNDANT Normal respiratory flora-no Staph aureus or Pseudomonas seen Performed at [facility], [address], [place], [place] [address]    Report Status 04/08/2021 FINAL  Final  Respiratory (~20 pathogens) panel by PCR     Status: None   Collection Time: 04/07/21  9:29 AM   Specimen: Nasopharyngeal Swab; Respiratory  Result Value Ref Range Status   Adenovirus NOT DETECTED NOT DETECTED Final   Coronavirus 229E NOT DETECTED NOT DETECTED Final    Comment: (NOTE) The Coronavirus on the Respiratory Panel, DOES NOT test for the novel  Coronavirus (2019 nCoV)    Coronavirus HKU1 NOT DETECTED  NOT DETECTED Final   Coronavirus NL63 NOT DETECTED NOT DETECTED Final   Coronavirus OC43 NOT DETECTED NOT DETECTED Final   Metapneumovirus NOT DETECTED NOT DETECTED Final   Rhinovirus / Enterovirus NOT DETECTED NOT DETECTED Final   Influenza A NOT DETECTED NOT DETECTED Final   Influenza B NOT DETECTED NOT DETECTED Final   Parainfluenza Virus 1 NOT DETECTED NOT DETECTED Final   Parainfluenza Virus 2 NOT DETECTED NOT DETECTED Final   Parainfluenza Virus 3 NOT DETECTED NOT DETECTED Final   Parainfluenza Virus 4 NOT DETECTED NOT DETECTED Final   Respiratory Syncytial Virus NOT DETECTED NOT DETECTED Final   Bordetella pertussis NOT DETECTED NOT DETECTED Final   Bordetella Parapertussis NOT DETECTED NOT DETECTED Final   Chlamydophila pneumoniae NOT DETECTED NOT DETECTED Final   Mycoplasma pneumoniae NOT DETECTED NOT DETECTED Final    Comment: Performed at [facility], [place]. [address] [place],  [address]  CSF culture w Stat Gram Stain     Status:  None (Preliminary result)   Collection Time: 04/07/21  2:28 PM   Specimen: CSF; Cerebrospinal Fluid  Result Value Ref Range Status   Specimen Description   Final    CSF Performed at [place] [address] [place], [place] [address]    Special Requests   Final    NONE Performed at [facility], [place] [address], [place], [place] [address]    Gram Stain   Final    NO WBC SEEN NO ORGANISMS SEEN CYTOSPIN SMEAR Gram Stain Report Called to,Read Back By and Verified With: R.MICHAELS, RN AT 1850 ON 11.21.22 BY N.THOMPSON Performed at [facility] [address], [place], [place] [address]    Culture   Final    NO GROWTH 3 DAYS Performed at [facility], [place] [address], [place], [place] [address]    Report Status PENDING  Incomplete  Anaerobic culture w Gram Stain     Status: None (Preliminary result)   Collection Time: 04/07/21  2:28 PM   Specimen: PATH Cytology  CSF; Cerebrospinal Fluid  Result Value Ref Range Status   Specimen Description   Final    CSF Performed at [facility] [place] [address] [place] [address] [place], [place] [address]    Special Requests   Final    NONE Performed at [facility], [place] [address], [place], [place] [address]    Gram Stain PENDING  Incomplete   Culture   Final    NO ANAEROBES ISOLATED; CULTURE IN PROGRESS FOR 5 DAYS   Report Status PENDING  Incomplete  Pneumocystis smear by DFA     Status: None   Collection Time: 04/09/21  2:49 PM   Specimen: Bronchial Alveolar Lavage; Respiratory  Result Value Ref Range Status   Specimen Source-PJSRC BRONCHIAL ALVEOLAR LAVAGE  Final    Comment: RUL   Pneumocystis jiroveci Ag NEGATIVE  Final    Comment: Performed at [facility], [place] [address], [place], [place] [address]  Culture, Respiratory w Gram Stain     Status: None (Preliminary result)   Collection Time: 04/09/21  2:49 PM   Specimen: Bronchial Alveolar Lavage; Respiratory  Result Value Ref Range Status   Specimen Description   Final    BRONCHIAL ALVEOLAR LAVAGE RUL Performed at [place] [address] [place], [place] [address]    Special Requests   Final    NONE Performed at [facility], [place] [address] [place], [place] [address]    Gram Stain   Final    FEW WBC PRESENT, PREDOMINANTLY MONONUCLEAR NO ORGANISMS SEEN    Culture   Final    NO GROWTH < 24 HOURS Performed at [place] [address], [place], [place] [address]    Report Status PENDING  Incomplete  Anaerobic culture w Gram Stain     Status: None (Preliminary result)   Collection Time: 04/09/21  2:49 PM   Specimen: Bronchial Alveolar Lavage; Respiratory  Result Value Ref Range Status   Specimen Description   Final    BRONCHIAL ALVEOLAR LAVAGE RUL Performed at [place] [address] [place], [place] [address]    Special  Requests   Final    NONE Performed at [facility], [place] [address], [place], [place] [address]    Gram Stain   Final    FEW WBC PRESENT, PREDOMINANTLY MONONUCLEAR NO ORGANISMS SEEN Performed at [facility], [place] [address] [place], [place] [address]    Culture PENDING  Incomplete   Report Status PENDING  Incomplete         Radiology Studies: DG Chest Port 1 View  Result Date: 04/09/2021 CLINICAL DATA:  Status post bronchoscopy, previous abnormal chest x-ray EXAM: PORTABLE CHEST 1 VIEW COMPARISON:  04/05/2021 FINDINGS: Single frontal view of the chest demonstrates stable left chest wall port. Cardiac silhouette is unremarkable. Progression of the consolidation within the right upper lobe, likely representing post bronchoscopy changes. This could reflect edema or hemorrhage. The multifocal ground-glass airspace disease seen elsewhere throughout the lungs is stable. No large effusion or pneumothorax. No acute bony abnormalities. IMPRESSION: 1. Persistent bilateral ground-glass airspace disease consistent with edema or infection. Increased density in the right upper lobe likely represents post bronchoscopy changes, either lavage fluid or hemorrhage. 2. No evidence of pneumothorax. Electronically Signed   By: Randa Ngo M.D.   On: 04/09/2021 16:19   DG C-ARM BRONCHOSCOPY  Result Date: 04/09/2021 C-ARM BRONCHOSCOPY: Fluoroscopy was utilized by the requesting physician.  No radiographic interpretation.        Scheduled Meds:  carvedilol  12.5 mg Oral BID WC   Chlorhexidine Gluconate Cloth  6 each Topical Daily   lactose free nutrition  237 mL Oral TID WC   mouth rinse  15 mL Mouth Rinse BID   melatonin  3 mg Oral QHS   mirtazapine  7.5 mg Oral QHS   multivitamin with minerals  1 tablet Oral Daily   phosphorus  500 mg Oral Daily   Ensure Max Protein  11 oz Oral BID   sacubitril-valsartan  1 tablet Oral BID   sodium chloride flush  3 mL Intravenous Q12H    Continuous Infusions:     LOS: 10 days   Time spent: 52mns Greater than 50% of this time was spent in counseling, explanation of diagnosis, planning of further management, and coordination of care.   Voice Recognition /Viviann Sparedictation system was used to create this note, attempts have been made to correct errors. Please contact the author with questions and/or clarifications.   FFlorencia Reasons MD PhD FACP [facility]  Available via Epic secure chat 7am-7pm for nonurgent issues Please page for urgent issues To page the attending provider between 7A-7P or the covering provider during after hours 7P-7A, please log into the web site [email or web address] and access using universal [place] password for that web site. If you do not have the password, please call the hospital operator.    04/10/2021, 7:47 PM

## 2021-04-10 NOTE — Plan of Care (Signed)
  Problem: Education: Goal: Knowledge of General Education information will improve Description: Including pain rating scale, medication(s)/side effects and non-pharmacologic comfort measures Outcome: Completed/Met

## 2021-04-10 NOTE — Progress Notes (Signed)
Pt encouraged to wear nocturnal bipap, but she refused.  Pt stated it makes her more anxious and that she is breathing pretty good right now.  Bipap remains in room on standby as needed.  RN aware.

## 2021-04-11 ENCOUNTER — Encounter (HOSPITAL_COMMUNITY): Payer: Self-pay

## 2021-04-11 DIAGNOSIS — Z171 Estrogen receptor negative status [ER-]: Secondary | ICD-10-CM

## 2021-04-11 DIAGNOSIS — C50411 Malignant neoplasm of upper-outer quadrant of right female breast: Secondary | ICD-10-CM

## 2021-04-11 DIAGNOSIS — D696 Thrombocytopenia, unspecified: Secondary | ICD-10-CM

## 2021-04-11 DIAGNOSIS — I5020 Unspecified systolic (congestive) heart failure: Secondary | ICD-10-CM

## 2021-04-11 LAB — BASIC METABOLIC PANEL
Anion gap: 3 — ABNORMAL LOW (ref 5–15)
BUN: 11 mg/dL (ref 8–23)
CO2: 38 mmol/L — ABNORMAL HIGH (ref 22–32)
Calcium: 8.1 mg/dL — ABNORMAL LOW (ref 8.9–10.3)
Chloride: 98 mmol/L (ref 98–111)
Creatinine, Ser: 0.5 mg/dL (ref 0.44–1.00)
GFR, Estimated: >60 mL/min (ref >60)
Glucose, Bld: 101 mg/dL — ABNORMAL HIGH (ref 70–99)
Potassium: 3.7 mmol/L (ref 3.5–5.1)
Sodium: 139 mmol/L (ref 135–145)

## 2021-04-11 LAB — CBC WITH DIFFERENTIAL/PLATELET
Abs Immature Granulocytes: 0.14 10*3/uL — ABNORMAL HIGH (ref 0.00–0.07)
Basophils Absolute: 0 10*3/uL (ref 0.0–0.1)
Basophils Relative: 0 %
Eosinophils Absolute: 0.1 10*3/uL (ref 0.0–0.5)
Eosinophils Relative: 1 %
HCT: 26.1 % — ABNORMAL LOW (ref 36.0–46.0)
Hemoglobin: 7.8 g/dL — ABNORMAL LOW (ref 12.0–15.0)
Immature Granulocytes: 3 %
Lymphocytes Relative: 42 %
Lymphs Abs: 2.1 10*3/uL (ref 0.7–4.0)
MCH: 26.3 pg (ref 26.0–34.0)
MCHC: 29.9 g/dL — ABNORMAL LOW (ref 30.0–36.0)
MCV: 87.9 fL (ref 80.0–100.0)
Monocytes Absolute: 0.4 10*3/uL (ref 0.1–1.0)
Monocytes Relative: 8 %
Neutro Abs: 2.3 10*3/uL (ref 1.7–7.7)
Neutrophils Relative %: 46 %
Platelets: 66 10*3/uL — ABNORMAL LOW (ref 150–400)
RBC: 2.97 MIL/uL — ABNORMAL LOW (ref 3.87–5.11)
RDW: 17.6 % — ABNORMAL HIGH (ref 11.5–15.5)
WBC: 5 10*3/uL (ref 4.0–10.5)
nRBC: 0.6 % — ABNORMAL HIGH (ref 0.0–0.2)

## 2021-04-11 LAB — CSF CULTURE W GRAM STAIN
Culture: NO GROWTH
Gram Stain: NONE SEEN

## 2021-04-11 LAB — CULTURE, RESPIRATORY W GRAM STAIN: Culture: NO GROWTH

## 2021-04-11 LAB — FOLATE: Folate: 7.9 ng/mL (ref >5.9)

## 2021-04-11 LAB — PHOSPHORUS: Phosphorus: 1.6 mg/dL — ABNORMAL LOW (ref 2.5–4.6)

## 2021-04-11 LAB — CYTOLOGY - NON PAP

## 2021-04-11 LAB — MAGNESIUM: Magnesium: 2 mg/dL (ref 1.7–2.4)

## 2021-04-11 MED ORDER — FUROSEMIDE 20 MG PO TABS
20.0000 mg | ORAL_TABLET | Freq: Every day | ORAL | Status: DC
  Administered 2021-04-12 – 2021-04-15 (×4): 20 mg via ORAL
  Filled 2021-04-11 (×4): qty 1

## 2021-04-11 MED ORDER — K PHOS MONO-SOD PHOS DI & MONO 155-852-130 MG PO TABS
500.0000 mg | ORAL_TABLET | Freq: Every day | ORAL | Status: AC
  Administered 2021-04-11 – 2021-04-14 (×4): 500 mg via ORAL
  Filled 2021-04-11 (×4): qty 2

## 2021-04-11 MED ORDER — SPIRONOLACTONE 12.5 MG HALF TABLET
12.5000 mg | ORAL_TABLET | Freq: Every day | ORAL | Status: DC
  Administered 2021-04-12 – 2021-04-15 (×4): 12.5 mg via ORAL
  Filled 2021-04-11 (×4): qty 1

## 2021-04-11 NOTE — Consult Note (Signed)
[place] [phone]   Fax:([phone] [place] NOTE  Patient Care Team: System, Provider Not In as PCP - General Croitoru, Mihai, MD as PCP - Cardiology (Cardiology)  Hematological/Oncological History # Thrombocytopenia #Locally advanced HER2 Positive Breast Cancer  CHIEF COMPLAINTS/PURPOSE OF CONSULTATION:  "Thrombocyopenia "  HISTORY OF PRESENTING ILLNESS:  Marie Church 65 y.o. female with medical history significant for locally advanced HER2+ breast cancer, DM type II and HTN who was admitted for worsening shortness of breath and found to have pneumonia/encephalopathy.   On review of the previous records Marie Church is followed by [facility] for her cancer care.  She has HER2 positive locally advanced cancer. She is currently on Kadcyla, having started in Sept 2022, with Cycle 3 starting on 03/19/2021. She is also undergoing radiation therapy here at the [facility] with Radiation Oncology. On 03/30/2021 the patient presented to the emergency department with worsening shortness of breath.  She was initially treated with outpatient antibiotics but when this failed she was admitted for further evaluation and management.  Studies including a CT angiogram showed no evidence of pulmonary embolism but there was noted to be diffuse groundglass opacities.  She was COVID and influenza negative.  She was found to have hypercapnia and started on BiPAP.  A bronchoscopy was performed on 04/09/2021 with results still pending.  From a hematological perspective the patient was admitted on 03/30/2021 with platelets of 121.  During the course of her hospitalization the platelets have continued to trend downward.  Labs today show a hemoglobin 7.8, platelets of 66, MCV of 87.9, and white blood cell of 5.0.  Due to concern for this patient's thrombocytopenia hematology was consulted for further evaluation and management.  On exam today Marie Church is unaccompanied.   She notes that her shortness of breath has been steadily improving.  She is currently on 2 L of oxygen breathing comfortably.  She notes that she is continuing to struggle with fatigue but denies any bleeding, bruising, or dark stools.  She denies any swelling in her lower extremities or pain in her chest.  Her appetite has been poor today.  She denies any fevers, chills, sweats, nausea, vomiting or diarrhea.  Full 10 point ROS is listed below.  MEDICAL HISTORY:  Past Medical History:  Diagnosis Date   Breast cancer ([place])    undergoing chemothearpy    DM type 2 (diabetes mellitus, type 2) ([place])    reportedly steroid induced   Hypertension     SURGICAL HISTORY: Past Surgical History:  Procedure Laterality Date   BREAST LUMPECTOMY Right 11/30/2020   RIGHT/LEFT HEART CATH AND CORONARY ANGIOGRAPHY N/A 10/10/2020   Procedure: RIGHT/LEFT HEART CATH AND CORONARY ANGIOGRAPHY;  Surgeon: Jolaine Artist, MD;  Location: [place] [facility];  Service: Cardiovascular;  Laterality: N/A;    SOCIAL HISTORY: Social History   Socioeconomic History   Marital status: Single    Spouse name: Not on file   Number of children: Not on file   Years of education: Not on file   Highest education level: Not on file  Occupational History   Not on file  Tobacco Use   Smoking status: Former    Types: Cigarettes   Smokeless tobacco: Never  Vaping Use   Vaping Use: Never used  Substance and Sexual Activity   Alcohol use: Never   Drug use: Never   Sexual activity: Not on file  Other Topics Concern   Not on file  Social History  Narrative   Not on file   Social Determinants of Health   Financial Resource Strain: Medium Risk   Difficulty of Paying Living Expenses: Somewhat hard  Food Insecurity: Food Insecurity Present   Worried About Running Out of Food in the Last Year: Sometimes true   Ran Out of Food in the Last Year: Sometimes true  Transportation Needs: Unmet Transportation Needs   Lack of  Transportation (Medical): Yes   Lack of Transportation (Non-Medical): Yes  Physical Activity: Not on file  Stress: Stress Concern Present   Feeling of Stress : To some extent  Social Connections: Socially Isolated   Frequency of Communication with Friends and Family: More than three times a week   Frequency of Social Gatherings with Friends and Family: More than three times a week   Attends Religious Services: Never   Marine scientist or Organizations: No   Attends Music therapist: Never   Marital Status: Divorced  Human resources officer Violence: Not on file    FAMILY HISTORY: Family History  Problem Relation Age of Onset   Heart failure Neg Hx    Heart disease Neg Hx    Heart attack Neg Hx     ALLERGIES:  is allergic to doxycycline, amoxicillin-pot clavulanate, and lisinopril.  MEDICATIONS:  Current Facility-Administered Medications  Medication Dose Route Frequency Provider Last Rate Last Admin   acetaminophen (TYLENOL) tablet 325-650 mg  325-650 mg Oral Q4H PRN Janeece Riggers, MD       Or   acetaminophen (TYLENOL) 160 MG/5ML solution 325-650 mg  325-650 mg Oral Q4H PRN Janeece Riggers, MD       acetaminophen (TYLENOL) tablet 650 mg  650 mg Oral Q6H PRN Lenore Cordia, MD   650 mg at 04/11/21 0145   Or   acetaminophen (TYLENOL) suppository 650 mg  650 mg Rectal Q6H PRN Lenore Cordia, MD   650 mg at 04/06/21 1958   albuterol (PROVENTIL) (2.5 MG/3ML) 0.083% nebulizer solution 2.5 mg  2.5 mg Nebulization Q4H PRN Zada Finders R, MD   2.5 mg at 04/01/21 2007   carvedilol (COREG) tablet 12.5 mg  12.5 mg Oral BID WC [place] Donnamarie Poag, DO   12.5 mg at 04/11/21 1002   Chlorhexidine Gluconate Cloth 2 % PADS 6 each  6 each Topical Daily Lenore Cordia, MD   6 each at 04/11/21 1003   fentaNYL (SUBLIMAZE) injection 25-50 mcg  25-50 mcg Intravenous Q5 min PRN Audry Pili, MD       fentaNYL (SUBLIMAZE) injection 25-50 mcg  25-50 mcg Intravenous Q5 min PRN Janeece Riggers,  MD       guaiFENesin-dextromethorphan (ROBITUSSIN DM) 100-10 MG/5ML syrup 5 mL  5 mL Oral Q4H PRN [place] Eric J, DO   5 mL at 04/10/21 0856   hydrALAZINE (APRESOLINE) tablet 25 mg  25 mg Oral Q6H PRN [place] Donnamarie Poag, DO       hydrOXYzine (ATARAX/VISTARIL) tablet 10 mg  10 mg Oral TID PRN Hosie Poisson, MD   10 mg at 04/09/21 0338   lactose free nutrition (BOOST PLUS) liquid 237 mL  237 mL Oral TID WC [place] Eric J, DO   237 mL at 04/11/21 1003   MEDLINE mouth rinse  15 mL Mouth Rinse BID Hosie Poisson, MD   15 mL at 04/10/21 2207   melatonin tablet 3 mg  3 mg Oral QHS Hosie Poisson, MD   3 mg at 04/10/21 2207   meperidine (DEMEROL) injection 6.25-12.5 mg  6.25-12.5 mg Intravenous Q5 min PRN Janeece Riggers, MD       mirtazapine (REMERON) tablet 7.5 mg  7.5 mg Oral QHS Florencia Reasons, MD   7.5 mg at 04/10/21 2207   multivitamin with minerals tablet 1 tablet  1 tablet Oral Daily [place] Eric J, DO   1 tablet at 04/11/21 1003   ondansetron (ZOFRAN) tablet 4 mg  4 mg Oral Q6H PRN Lenore Cordia, MD   4 mg at 04/08/21 2145   Or   ondansetron (ZOFRAN) injection 4 mg  4 mg Intravenous Q6H PRN Lenore Cordia, MD   4 mg at 04/08/21 1532   ondansetron (ZOFRAN) injection 4 mg  4 mg Intravenous Once PRN Audry Pili, MD       ondansetron [facility]) injection 4 mg  4 mg Intravenous Once PRN Janeece Riggers, MD       oxyCODONE (Oxy IR/ROXICODONE) immediate release tablet 5 mg  5 mg Oral Once PRN Audry Pili, MD       Or   oxyCODONE (ROXICODONE) 5 MG/5ML solution 5 mg  5 mg Oral Once PRN Audry Pili, MD       phosphorus (K PHOS NEUTRAL) tablet 500 mg  500 mg Oral Daily Florencia Reasons, MD       protein supplement (ENSURE MAX) liquid  11 oz Oral BID Hosie Poisson, MD   11 oz at 04/11/21 1004   sacubitril-valsartan (ENTRESTO) 24-26 mg per tablet  1 tablet Oral BID Lenore Cordia, MD   1 tablet at 04/11/21 1004   senna-docusate (Senokot-S) tablet 1 tablet  1 tablet Oral QHS PRN Lenore Cordia, MD       sodium  chloride (OCEAN) 0.65 % nasal spray 1 spray  1 spray Each Nare PRN Georgette Shell, MD       sodium chloride flush (NS) 0.9 % injection 10-40 mL  10-40 mL Intracatheter PRN Lenore Cordia, MD   10 mL at 04/11/21 0612   sodium chloride flush (NS) 0.9 % injection 3 mL  3 mL Intravenous Q12H Lenore Cordia, MD   3 mL at 04/09/21 2104    REVIEW OF SYSTEMS:   Constitutional: ( - ) fevers, ( - )  chills , ( - ) night sweats Eyes: ( - ) blurriness of vision, ( - ) double vision, ( - ) watery eyes Ears, nose, mouth, throat, and face: ( - ) mucositis, ( - ) sore throat Respiratory: ( - ) cough, ( - ) dyspnea, ( - ) wheezes Cardiovascular: ( - ) palpitation, ( - ) chest discomfort, ( - ) lower extremity swelling Gastrointestinal:  ( - ) nausea, ( - ) heartburn, ( - ) change in bowel habits Skin: ( - ) abnormal skin rashes Lymphatics: ( - ) new lymphadenopathy, ( - ) easy bruising Neurological: ( - ) numbness, ( - ) tingling, ( - ) new weaknesses Behavioral/Psych: ( - ) mood change, ( - ) new changes  All other systems were reviewed with the patient and are negative.  PHYSICAL EXAMINATION:  Vitals:   04/11/21 0535 04/11/21 1300  BP: (!) 158/87 (!) 155/92  Pulse: 86 77  Resp:  18  Temp: 98.6 F (37 C) 98.2 F (36.8 C)  SpO2: 100% 100%   Filed Weights   04/08/21 0550 04/09/21 0500 04/10/21 0500  Weight: 186 lb 1.1 oz (84.4 kg) 191 lb 12.8 oz (87 kg) 194 lb 10.7 oz (88.3 kg)  GENERAL: Chronically ill-appearing middle-aged African-American female, in NAD  SKIN: skin color, texture, turgor are normal, no rashes or significant lesions EYES: conjunctiva are pink and non-injected, sclera clear LUNGS: clear to auscultation and percussion with normal breathing effort HEART: regular rate & rhythm and no murmurs and no lower extremity edema Musculoskeletal: no cyanosis of digits and no clubbing  PSYCH: alert & oriented x 3, fluent speech NEURO: no focal motor/sensory  deficits  LABORATORY DATA:  I have reviewed the data as listed CBC Latest Ref Rng & Units 04/11/2021 04/10/2021 04/09/2021  WBC 4.0 - 10.5 K/uL 5.0 5.6 3.9(L)  Hemoglobin 12.0 - 15.0 g/dL 7.8(L) 8.2(L) 8.0(L)  Hematocrit 36.0 - 46.0 % 26.1(L) 27.4(L) 26.9(L)  Platelets 150 - 400 K/uL 66(L) 75(L) 73(L)    CMP Latest Ref Rng & Units 04/11/2021 04/10/2021 04/09/2021  Glucose 70 - 99 mg/dL 101(H) 161(H) 89  BUN 8 - 23 mg/dL _0 Creatinine 0.44 - 1.00 mg/dL 0.50 0.61 0.59  Sodium 135 - 145 mmol/L 139 135 136  Potassium 3.5 - 5.1 mmol/L 3.7 4.2 3.8  Chloride 98 - 111 mmol/L 98 98 99  CO2 22 - 32 mmol/L 38(H) 34(H) 34(H)  Calcium 8.9 - 10.3 mg/dL 8.1(L) 7.8(L) 8.0(L)  Total Protein 6.5 - 8.1 g/dL - 8.3(H) 8.1  Total Bilirubin 0.3 - 1.2 mg/dL - 0.5 0.2(L)  Alkaline Phos 38 - 126 U/L - 169(H) 96  AST 15 - 41 U/L - 84(H) 65(H)  ALT 0 - 44 U/L - 28 24    RADIOGRAPHIC STUDIES: CT ABDOMEN PELVIS WO CONTRAST  Result Date: 04/06/2021 CLINICAL DATA:  Abdominal pain and fever EXAM: CT ABDOMEN AND PELVIS WITHOUT CONTRAST TECHNIQUE: Multidetector CT imaging of the abdomen and pelvis was performed following the standard protocol without IV contrast. COMPARISON:  03/28/2021 FINDINGS: LOWER CHEST: Severe bilateral multifocal airspace disease has worsened HEPATOBILIARY: Normal hepatic contours. No intra- or extrahepatic biliary dilatation. The gallbladder is normal. PANCREAS: Normal pancreas. No ductal dilatation or peripancreatic fluid collection. SPLEEN: Normal. ADRENALS/URINARY TRACT: The adrenal glands are normal. No hydronephrosis, nephroureterolithiasis or solid renal mass. The urinary bladder is normal for degree of distention STOMACH/BOWEL: There is no hiatal hernia. Normal duodenal course and caliber. No small bowel dilatation or inflammation. No focal colonic abnormality. Normal appendix. VASCULAR/LYMPHATIC: Normal course and caliber of the major abdominal vessels. No abdominal or pelvic  lymphadenopathy. REPRODUCTIVE: Normal uterus. No adnexal mass. MUSCULOSKELETAL. No bony spinal canal stenosis or focal osseous abnormality. OTHER: Post treatment changes of the right breast. IMPRESSION: 1. No acute abnormality of the abdomen or pelvis. 2. Severe bilateral multifocal pulmonary disease, worsened. This may indicate infection. Electronically Signed   By: Ulyses Jarred M.D.   On: 04/06/2021 22:40   DG Chest 2 View  Result Date: 04/05/2021 CLINICAL DATA:  Fever EXAM: CHEST - 2 VIEW COMPARISON:  04/03/2019 FINDINGS: Lung volumes are small, however, pulmonary insufflation appears stable since prior examination. Superimposed diffuse interstitial pulmonary infiltrate, atypical infection versus edema, appears stable since prior examination. No pneumothorax or pleural effusion. Cardiac size within normal limits. Left internal jugular chest port tip noted within the superior vena cava. No acute bone abnormality. IMPRESSION: Pulmonary hypoinflation, unchanged. Stable diffuse interstitial pulmonary infiltrate, atypical infection versus edema. Electronically Signed   By: Fidela Salisbury M.D.   On: 04/05/2021 20:46   DG Chest 2 View  Result Date: 04/02/2021 CLINICAL DATA:  65 year old lady with prior history of malignant right breast cancer with axillary node involvement on active  Kadcyla and radiation treatment, chronic systolic heart failure with improvement of left ventricular ejection fraction from 25% to 50%, type 2 diabetes mellitus, hypertension, anemia, thrombocytopenia presents to ED on 03/30/2021 for shortness of breath. CT angiogram of the chest shows diffuse bilateral groundglass opacities consistent with pulmonary edema versus infectious process. Patient was probably started on broad-spectrum IV antibiotics and referred to [facility] for admission for acute respiratory failure with hypoxia secondary to pneumonia in the setting of failed outpatient antibiotic treatment. EXAM: CHEST - 2 VIEW COMPARISON:   03/30/2021 and older exams.  CT also dated 03/30/2021. FINDINGS: Heart normal in size.  No convincing mediastinal or hilar masses. Lungs show bilateral interstitial and patchy airspace opacities, which appear increased from the prior exam, most evident in the right mid and lower lung. No pleural effusion and no pneumothorax. Left anterior chest wall Port-A-Cath is stable. Minor wedge-shaped compression deformity of T4 stable from the prior chest CT. No acute skeletal abnormality. IMPRESSION: 1. Interval worsening of lung aeration. Interstitial and patchy airspace opacities have increased most evident in the right mid to lower lung. 2. No other change.  No pleural effusion or pneumothorax. Electronically Signed   By: Lajean Manes M.D.   On: 04/02/2021 16:04   DG Chest 2 View  Result Date: 03/30/2021 CLINICAL DATA:  Assess for sepsis EXAM: CHEST - 2 VIEW COMPARISON:  March 28, 2021 FINDINGS: The mediastinal contour and cardiac silhouette are stable. Left central venous line is identified unchanged. There is pulmonary edema. There is no focal pneumonia or pleural effusion. Bony structures are stable. IMPRESSION: Pulmonary edema. Electronically Signed   By: Abelardo Diesel M.D.   On: 03/30/2021 15:20   DG Abd 1 View  Result Date: 03/31/2021 CLINICAL DATA:  Feeling ill EXAM: ABDOMEN - 1 VIEW COMPARISON:  None. FINDINGS: Nonobstructive pattern of bowel gas with gas present to the rectum. No large burden of stool. No free air in the abdomen on supine radiographs. No radio-opaque calculi or other significant radiographic abnormality are seen. IMPRESSION: Nonobstructive pattern of bowel gas with gas present to the rectum. No large burden of stool. No free air in the abdomen on supine radiographs. Electronically Signed   By: Delanna Ahmadi M.D.   On: 03/31/2021 09:12   CT CHEST WO CONTRAST  Result Date: 04/08/2021 CLINICAL DATA:  Chest pain EXAM: CT CHEST WITHOUT CONTRAST TECHNIQUE: Multidetector CT imaging  of the chest was performed following the standard protocol without IV contrast. COMPARISON:  CT chest dated March 30, 2021 FINDINGS: Cardiovascular: Cardiomegaly. No pericardial effusion. No significant coronary artery calcifications. Atherosclerotic disease of the thoracic aorta. Left chest wall port with tip near the superior cavoatrial junction. Mediastinum/Nodes: Mildly enlarged left axillary lymph node measuring 1.2 cm in short axis, unchanged compared to prior exam. Unchanged subcarinal lymph node measuring 1.3 cm in short axis on series 2, image 61. Lungs/Pleura: Central airways are patent. Bilateral patchy ground-glass opacities, increased compared to prior exam. Smooth interlobular septal thickening. Trace bilateral pleural effusions and atelectasis. Upper Abdomen: No acute abnormality. Musculoskeletal: No chest wall mass or suspicious bone lesions identified. IMPRESSION: 1. Patchy bilateral ground-glass opacities and smooth interlobular septal thickening, increased compared to prior exam, differential includes pulmonary edema versus multifocal infection. 2. Trace bilateral pleural effusions and atelectasis. 3. Unchanged mildly enlarged mediastinal and left axillary lymph nodes, likely reactive. 4.  Aortic Atherosclerosis (ICD10-I70.0). Electronically Signed   By: Yetta Glassman M.D.   On: 04/08/2021 08:17   CT Angio Chest PE W and/or  Wo Contrast  Result Date: 03/30/2021 CLINICAL DATA:  Shortness of breath EXAM: CT ANGIOGRAPHY CHEST WITH CONTRAST TECHNIQUE: Multidetector CT imaging of the chest was performed using the standard protocol during bolus administration of intravenous contrast. Multiplanar CT image reconstructions and MIPs were obtained to evaluate the vascular anatomy. CONTRAST:  59m OMNIPAQUE IOHEXOL 350 MG/ML SOLN COMPARISON:  Chest CT dated Oct 03, 2020 FINDINGS: Cardiovascular: Adequate contrast opacification of the pulmonary arteries normal heart size. No pericardial effusion. No  evidence of central pulmonary embolus. Limited evaluation of the segmental and subsegmental pulmonary arteries due to streak artifact related to arm position. Minimal atherosclerotic disease of the thoracic aorta. Mediastinum/Nodes: Mildly enlarged mediastinal lymph nodes. Reference subcarinal lymph node measuring 1.3 cm in short axis on series 4, image 57. Lungs/Pleura: Diffuse bilateral ground-glass opacities with more focal ground-glass opacities of the upper lobes and areas of interlobular septal thickening. No pleural effusion or pneumothorax. Upper Abdomen: No acute abnormality. Skin thickening of the right breast. Musculoskeletal: No chest wall abnormality. No acute or significant osseous findings. Review of the MIP images confirms the above findings. IMPRESSION: 1. No evidence of central pulmonary embolus. Limited evaluation of the segmental and subsegmental pulmonary arteries due to streak artifact related to arm position. 2. Diffuse bilateral ground-glass opacities, differential includes pulmonary edema or infectious process including viral pneumonia. 3. Mildly enlarged mediastinal lymph nodes, likely reactive. Electronically Signed   By: LYetta GlassmanM.D.   On: 03/30/2021 18:03   MR BRAIN WO CONTRAST  Result Date: 04/06/2021 CLINICAL DATA:  Encephalopathy and fever of uncertain origin EXAM: MRI HEAD WITHOUT CONTRAST TECHNIQUE: Multiplanar, multiecho pulse sequences of the brain and surrounding structures were obtained without intravenous contrast. COMPARISON:  None. FINDINGS: Markedly motion degraded study. Brain: No acute infarct, mass effect or extra-axial collection. No acute or chronic hemorrhage. Normal white matter signal, parenchymal volume and CSF spaces. The midline structures are normal. Vascular: Major flow voids are preserved. Skull and upper cervical spine: Normal calvarium and skull base. Visualized upper cervical spine and soft tissues are normal. Sinuses/Orbits:No paranasal sinus  fluid levels or advanced mucosal thickening. No mastoid or middle ear effusion. Normal orbits. IMPRESSION: 1. Markedly motion degraded study. 2. No acute intracranial abnormality. Electronically Signed   By: KUlyses JarredM.D.   On: 04/06/2021 22:16   MR BRAIN W WO CONTRAST  Result Date: 04/08/2021 CLINICAL DATA:  Transient ischemic attack (TIA); acute stroke, follow up Neuro deficit, acute, stroke suspected EXAM: MRI HEAD WITHOUT AND WITH CONTRAST TECHNIQUE: Multiplanar, multiecho pulse sequences of the brain and surrounding structures were obtained without and with intravenous contrast. CONTRAST:  975mGADAVIST GADOBUTROL 1 MMOL/ML IV SOLN COMPARISON:  04/06/2021 FINDINGS: Motion artifact is present. Brain: There is no acute infarction or intracranial hemorrhage. There is no intracranial mass, mass effect, or edema. There is no hydrocephalus or extra-axial fluid collection. Ventricles and sulci are normal in size and configuration. Patchy foci of T2 hyperintensity in the supratentorial white matter are nonspecific but may reflect stable chronic microvascular ischemic changes. No definite abnormal enhancement. Vascular: Major vessel flow voids at the skull base are preserved. Skull and upper cervical spine: Normal marrow signal is preserved. Sinuses/Orbits: Mild patchy mucosal thickening. Orbits are unremarkable. Other: Sella is unremarkable.  Mastoid air cells are clear. IMPRESSION: Motion degraded. No acute abnormality.  No definite abnormal enhancement. Electronically Signed   By: PrMacy Mis.D.   On: 04/08/2021 19:39   CT Abdomen Pelvis W Contrast  Result Date: 03/28/2021 CLINICAL DATA:  Abdominal pain, acute, nonlocalized. EXAM: CT ABDOMEN AND PELVIS WITH CONTRAST TECHNIQUE: Multidetector CT imaging of the abdomen and pelvis was performed using the standard protocol following bolus administration of intravenous contrast. CONTRAST:  28m OMNIPAQUE IOHEXOL 350 MG/ML SOLN COMPARISON:  10/03/2020.  FINDINGS: Lower chest: The distal tip of a right central venous catheter terminates at the cavoatrial junction. The heart is normal in size. There is interstitial prominence and atelectasis at the lung bases. A few scattered pulmonary nodules are noted bilaterally, the largest in the right middle lobe measuring 5 mm, axial image 27. Hepatobiliary: No focal liver abnormality is seen. No gallstones, gallbladder wall thickening, or biliary dilatation. Pancreas: Unremarkable. No pancreatic ductal dilatation or surrounding inflammatory changes. Spleen: Normal in size without focal abnormality. Adrenals/Urinary Tract: Adrenal glands are unremarkable. Kidneys are normal, without renal calculi, focal lesion, or hydronephrosis. A subcentimeter hypodensity is present in the lower pole of the left kidney which is too small to further characterize. The urinary bladder is within normal limits for degree of distension. Stomach/Bowel: No bowel obstruction, free air or pneumatosis. The stomach is unremarkable. A few scattered diverticula are present along the colon without evidence of diverticulitis. No focal bladder wall thickening is seen. The appendix is within normal limits. Vascular/Lymphatic: Aortic atherosclerosis. No enlarged abdominal or pelvic lymph nodes. Reproductive: The uterus is in-situ and contains a few hyperdense lesions suggesting fibroids. Other: No free fluid in the pelvis. Musculoskeletal: Surgical changes, subcutaneous fat stranding and skin thickening are noted over the right breast, compatible with history of radiation therapy. No suspicious osseous lesion is identified. Degenerative changes are present in the thoracolumbar spine. IMPRESSION: 1. Interstitial prominence at the lung bases which is new from the prior exam and may be associated with edema or pneumonitis. 2. Nonspecific scattered pulmonary nodules at the lung bases. Short-term follow-up is recommended to exclude the possibility of neoplastic  process. 3. Postsurgical changes in the right breast with skin thickening, compatible with history of radiation therapy. Electronically Signed   By: LBrett FairyM.D.   On: 03/28/2021 21:45   DG Chest Port 1 View  Result Date: 04/09/2021 CLINICAL DATA:  Status post bronchoscopy, previous abnormal chest x-ray EXAM: PORTABLE CHEST 1 VIEW COMPARISON:  04/05/2021 FINDINGS: Single frontal view of the chest demonstrates stable left chest wall port. Cardiac silhouette is unremarkable. Progression of the consolidation within the right upper lobe, likely representing post bronchoscopy changes. This could reflect edema or hemorrhage. The multifocal ground-glass airspace disease seen elsewhere throughout the lungs is stable. No large effusion or pneumothorax. No acute bony abnormalities. IMPRESSION: 1. Persistent bilateral ground-glass airspace disease consistent with edema or infection. Increased density in the right upper lobe likely represents post bronchoscopy changes, either lavage fluid or hemorrhage. 2. No evidence of pneumothorax. Electronically Signed   By: MRanda NgoM.D.   On: 04/09/2021 16:19   DG Chest Port 1 View  Result Date: 03/28/2021 CLINICAL DATA:  Fever EXAM: PORTABLE CHEST 1 VIEW COMPARISON:  12/18/2020 FINDINGS: Left-sided central venous port tip over the SVC. No focal opacity or pleural effusion. Normal cardiomediastinal silhouette. No pneumothorax. IMPRESSION: No active disease. Electronically Signed   By: KDonavan FoilM.D.   On: 03/28/2021 17:53   EEG adult  Result Date: 04/08/2021 YLora Havens MD     04/08/2021  1:50 PM Patient Name: NRashidah BellevilleMRN: [id number] Attending: PLora HavensReferring Physician/Provider: KGardiner Barefoot NP Date: 04/08/2021 Duration: 24.15 mins Patient history: 65year old female with right breast cancer  on chemotherapy who presented with altered mental status.  EEG to evaluate for seizure. Level of alertness: Awake AEDs  during EEG study: None Technical aspects: This EEG study was done with scalp electrodes positioned according to the 10-20 International system of electrode placement. Electrical activity was acquired at a sampling rate of 500Hz and reviewed with a high frequency filter of 70Hz and a low frequency filter of 1Hz. EEG data were recorded continuously and digitally stored. Description: The posterior dominant rhythm consists of 8Hz activity of moderate voltage (25-35 uV) seen predominantly in posterior head regions, symmetric and reactive to eye opening and eye closing. EEG showed intermittent generalized polymorphic 3 to 6 Hz theta-delta slowing, at times with triphasic morphology. Hyperventilation and photic stimulation were not performed.   ABNORMALITY - Intermittent slow, generalized IMPRESSION: This study is suggestive of mild diffuse encephalopathy, nonspecific etiology. No seizures or epileptiform discharges were seen throughout the recording. Priyanka O Yadav   VAS [place] LOWER EXTREMITY VENOUS (DVT)  Result Date: 04/07/2021  Lower Venous DVT Study Patient Name:  Marie Church  Date of Exam:   04/07/2021 Medical Rec #: [id number]         Accession #:    [id number] Date of Birth: [birth year]         Patient Gender: F Patient Age:   81 years Exam Location:  [facility] Procedure:      VAS [place] LOWER EXTREMITY VENOUS (DVT) Referring Phys: Hosie Poisson --------------------------------------------------------------------------------  Indications: Edema.  Risk Factors: CA patient on chemotherapy. Comparison Study: No previous exams Performing Technologist: Jody Hill RVT, RDMS  Examination Guidelines: A complete evaluation includes B-mode imaging, spectral Doppler, color Doppler, and power Doppler as needed of all accessible portions of each vessel. Bilateral testing is considered an integral part of a complete examination. Limited examinations for reoccurring indications may be performed as noted. The reflux  portion of the exam is performed with the patient in reverse Trendelenburg.  +---------+---------------+---------+-----------+----------+--------------+ RIGHT    CompressibilityPhasicitySpontaneityPropertiesThrombus Aging +---------+---------------+---------+-----------+----------+--------------+ CFV      Full           Yes      Yes                                 +---------+---------------+---------+-----------+----------+--------------+ SFJ      Full                                                        +---------+---------------+---------+-----------+----------+--------------+ FV Prox  Full           Yes      Yes                                 +---------+---------------+---------+-----------+----------+--------------+ FV Mid   Full           Yes      Yes                                 +---------+---------------+---------+-----------+----------+--------------+ FV DistalFull           Yes      Yes                                 +---------+---------------+---------+-----------+----------+--------------+  PFV      Full                                                        +---------+---------------+---------+-----------+----------+--------------+ POP      Full           Yes      Yes                                 +---------+---------------+---------+-----------+----------+--------------+ PTV      Full                                                        +---------+---------------+---------+-----------+----------+--------------+ PERO     Full                                                        +---------+---------------+---------+-----------+----------+--------------+   +---------+---------------+---------+-----------+----------+--------------+ LEFT     CompressibilityPhasicitySpontaneityPropertiesThrombus Aging +---------+---------------+---------+-----------+----------+--------------+ CFV      Full           Yes      Yes                                  +---------+---------------+---------+-----------+----------+--------------+ SFJ      Full                                                        +---------+---------------+---------+-----------+----------+--------------+ FV Prox  Full           Yes      Yes                                 +---------+---------------+---------+-----------+----------+--------------+ FV Mid   Full           Yes      Yes                                 +---------+---------------+---------+-----------+----------+--------------+ FV DistalFull           Yes      Yes                                 +---------+---------------+---------+-----------+----------+--------------+ PFV      Full                                                        +---------+---------------+---------+-----------+----------+--------------+  POP      Full           Yes      Yes                                 +---------+---------------+---------+-----------+----------+--------------+ PTV      Full                                                        +---------+---------------+---------+-----------+----------+--------------+ PERO     Full                                                        +---------+---------------+---------+-----------+----------+--------------+     Summary: BILATERAL: - No evidence of deep vein thrombosis seen in the lower extremities, bilaterally. - No evidence of superficial venous thrombosis in the lower extremities, bilaterally. - RIGHT: - A cystic structure is found in the popliteal fossa.  LEFT: - No cystic structure found in the popliteal fossa.  *See table(s) above for measurements and observations. Electronically signed by Servando Snare MD on 04/07/2021 at 4:10:01 PM.    Final    DG C-ARM BRONCHOSCOPY  Result Date: 04/09/2021 C-ARM BRONCHOSCOPY: Fluoroscopy was utilized by the requesting physician.  No radiographic interpretation.   DG FLUORO  GUIDE LUMBAR PUNCTURE  Result Date: 04/07/2021 CLINICAL DATA:  Altered mental status. Clinical need for CSF analysis. EXAM: DIAGNOSTIC LUMBAR PUNCTURE UNDER FLUOROSCOPIC GUIDANCE COMPARISON:  MRI of the brain and abdominopelvic CT 04/06/2021. FLUOROSCOPY TIME:  Fluoroscopy Time: 12 seconds of low-dose pulsed fluoroscopy Radiation Exposure Index (if provided by the fluoroscopic device): 0.9 mGy Number of Acquired Spot Images: 0 PROCEDURE: Standard time-out was employed. I discussed the risks (including hemorrhage, infection, headache, and nerve damage, among others), benefits, and alternatives to fluoroscopically guided lumbar puncture with the patient's daughter. We specifically discussed the high technical likelihood of success of the procedure. The patient's daughter understood and elected to undergo the procedure. An appropriate site for lumbar puncture was marked on the patient's skin under fluoroscopic guidance. Following sterile skin prep and local anesthetic administration consisting of 1 percent lidocaine, a 22 gauge spinal needle was advanced without difficulty into the thecal sac at the at the L2-3 level under fluoroscopic guidance. Clear CSF was returned. Opening pressure was 14 cm of water measured prone through the needle. 10.5 cc of clear CSF was collected. The needle was subsequently removed and the skin cleansed and bandaged. No immediate complications were observed. IMPRESSION: Diagnostic lumbar puncture performed without complication. Electronically Signed   By: Richardean Sale M.D.   On: 04/07/2021 15:42    ASSESSMENT & PLAN Marie Church 65 y.o. female with medical history significant for locally advanced HER2+ breast cancer, DM type II and HTN who was admitted for worsening shortness of breath and found to have pneumonia/encephalopathy.   After review of the labs, review of the records, and discussion with the patient the patients findings are most consistent with thrombocytopenia and  normocytic anemia due to multifactorial causes.  These causes include inflammation from infection, antibiotic treatment, breast cancer treatment, and likely poor nutrition while  inpatient.  The patient has had a slow steady decline of the platelets and therefore it is considerably less likely. 4T score calculated at 3.  Very low suspicious of a possible TMA.  Given these findings I recommended we order nutritional labs and continue to monitor patient's counts.  # Thrombocytopenia # Normocytic Anemia -- Findings at this time appear multifactorial including due to infection, antibiotics, breast cancer treatment side effect, and poor nutrition. --Today we will order nutritional labs to include vitamin B12, folate, iron, and ferritin. --will order lysis labs with LDH, haptoglobin, DAT, and reticulocytes.  --4T Score of 3, markedly low probability of HIT (<5%). Will follow HIT panel ordered today, but holding DVT prophylaxis with heparin is not required.  --Hematology will continue to follow.   # Locally Advanced HER2 positive Breast Cancer -- Patient is currently on Kadcyla, started in September 2022 with cycle 3 having started on 03/19/2021 --Defer to [facility] oncology regarding patient's breast cancer care.  All questions were answered. The patient knows to call the clinic with any problems, questions or concerns.  A total of more than 50 minutes were spent on this encounter with face-to-face time and non-face-to-face time, including preparing to see the patient, ordering tests and/or medications, counseling the patient and coordination of care as outlined above.   Ledell Peoples, MD Department of Hematology/Oncology [place] [facility] Phone: [phone] Pager: [phone] Email: [email or web address]  04/11/2021 4:47 PM

## 2021-04-11 NOTE — Progress Notes (Signed)
PROGRESS NOTE    Marie Church  LEX:[id number] DOB: [birth year] DOA: 03/30/2021 PCP: System, Provider Not In    Chief Complaint  Patient presents with   Pneumonia    Brief Narrative:  Marie Church is a 65 y.o. 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 on active Kadcyla and radiation treatment, HFrEF (EF improved from 20-25% to 50%), T2DM, HTN, anemia/thrombocytopenia who presented to the ED for evaluation of shortness of breath  Subjective:  She remains very weak but  there is no confusion,  she states occasional productive  cough with blood tinged sputum, no chest pain, no edema, no fever, she remains on o2 supplement Has poor appetite for which started on appetite stimulation  son at bedside   Assessment & Plan:   Principal Problem:   Pneumonitis Active Problems:   Anemia   Mediastinal lymphadenopathy   Malignant neoplasm of upper-outer quadrant of right breast in female, estrogen receptor negative (HCC)   HFrEF (heart failure with reduced ejection fraction) (HCC)   Hyponatremia   Thrombocytopenia (HCC)   Acute respiratory failure (HCC)   Malnutrition of moderate degree   Acute metabolic encephalopathy   Community acquired pneumonia   Lung mass   Acute respiratory failure with hypoxia, sepsis secondary to bilateral pneumonia present on admission, failed outpatient treatment for pneumonia. -CT angiogram of the chest on 11/13 negative for PE, but showed diffuse ground glass opacities which is persistent on repeat ct chest on 11/21 -COVID-19 PCR, influenza PCR is negative, urine for strep pneumonia is negative,.blood cultures negative till date.  -Differentials include radiation pneumonitis vs metastatic disease from breast cancer.  -s/p bronchoscopy on 11/23, f/u on culture result -she was on Vancomycin cefepime and acyclovir then  Rocephin , abx discontinued on 11/24 per ID recommendation --  appreciate PCCM and ID input  Acute toxic /metabolic encephalopathy -MRI brain without contrast is negative for acute intracranial abnormality. -Ammonia level is 24, TSH is within normal limits -Abg with mild co2 retention, on bipap qhs Neurology consulted, mri brain no acute findings, eeg unremarkable, continue treating underline metabolic cause, neurology singed off Confusion appears has resolved    Aspiration risk mild  Initially speech recommends thin liquid with small sips and bites. Confusion appear has resolved, appear tolerating regular diet   Hyponatremia Suspect hypovolemic hyponatremia in the setting of dehydration. Improved, Continue to monitor.  Hypophosphatemia Remain low , continue to Replace phose    Chronic systolic heart failure/history of nonischemic cardiomyopathy. Echocardiogram on 03/03/2021 showed improvement in left ventricular ejection fraction to 50% compared to the previous echo showing left ventricular ejection fraction at 20 to 25%. Continue with Coreg at 12.5 mg twice daily, Entresto 24-26 mg twice daily.  Lasix and spironolactone held due to hypotension initially, now bp start to trend up,  resume on 11/26 She states does not take farxiga       Metastatic right-sided breast cancer with axillary node involvement Patient follows up with [facility]ology, currently on active chemotherapy with Kadcyla with last treatment on 03/19/2021 Patient also undergoing radiation treatment through radiation oncology at [facility]. She has one more week of radiation treatment.  She has not received any radiation this week as the radiation machine has been not working.   Anemia/thrombocytopenia Probably secondary to chemotherapy Continue to monitor counts. Hemoglobin stable around 8 and platelet counts worsening, will consult hematology  Anxiety She wants to try one does benadryl now, she wants to continue hydroxyzine at night, she  thinks hydroxyzine is too strong for her  during the day  Start remeron for appetite  FTT, start PT eval   Nutritional Assessment:  The patient's BMI is: Body mass index is 31.29 kg/m.Marland Kitchen  Seen by dietician.  I agree with the assessment and plan as outlined below:  Nutrition Status: Nutrition Problem: Moderate Malnutrition Etiology: chronic illness, cancer and cancer related treatments Signs/Symptoms: mild fat depletion, mild muscle depletion Interventions: Boost Plus, MVI, Premier Protein  .     Unresulted Labs (From admission, onward)     Start     Ordered   04/12/21 0500  Heparin induced platelet Ab (HIT antibody)  Tomorrow morning,   R       Question:  Specimen collection method  Answer:  IV Team=IV Team collect   04/11/21 1058   04/12/21 0500  Vitamin B12  Tomorrow morning,   R       Question:  Specimen collection method  Answer:  IV Team=IV Team collect   04/11/21 1643   04/12/21 0500  Lactate dehydrogenase  Tomorrow morning,   R       Question:  Specimen collection method  Answer:  IV Team=IV Team collect   04/11/21 1643   04/12/21 0500  Reticulocytes  Tomorrow morning,   R       Question:  Specimen collection method  Answer:  IV Team=IV Team collect   04/11/21 1643   04/12/21 0500  Direct antiglobulin test  Once,   R        04/11/21 1643   04/12/21 0500  Haptoglobin  Tomorrow morning,   R       Question:  Specimen collection method  Answer:  IV Team=IV Team collect   04/11/21 1643   04/12/21 0500  Iron and TIBC  Tomorrow morning,   R       Question:  Specimen collection method  Answer:  IV Team=IV Team collect   04/11/21 1643   04/12/21 0500  Ferritin  Tomorrow morning,   R       Question:  Specimen collection method  Answer:  IV Team=IV Team collect   04/11/21 1643   04/12/21 0500  CBC  Daily,   R     Question:  Specimen collection method  Answer:  IV Team=IV Team collect   04/11/21 1919   04/12/21 1610  Basic metabolic panel  Tomorrow morning,   R       Question:  Specimen collection method   Answer:  IV Team=IV Team collect   04/11/21 1919   04/12/21 0500  Phosphorus  Tomorrow morning,   R       Question:  Specimen collection method  Answer:  IV Team=IV Team collect   04/11/21 1919   04/09/21 1452  Aspergillus Ag, BAL/Serum  RELEASE UPON ORDERING,   TIMED       Comments: Specimen C: Phone [phone]         Previous Biopsy:  no Is the patient on airborne/droplet precautions? No           Clinical History:  N/A Copy of Report to:  N/A Specimen Disposition: Cytology     04/09/21 1452   04/09/21 1452  Fungus Culture With Stain  RELEASE UPON ORDERING,   TIMED       Comments: Specimen C: Phone [phone]         Previous Biopsy:  no Is the patient on airborne/droplet precautions? No  Clinical History:  N/A Copy of Report to:  N/A Specimen Disposition: Cytology     04/09/21 1452   04/09/21 1449  Acid Fast Culture with reflexed sensitivities  Once,   R        04/09/21 1449   04/07/21 1532  Draw extra clot tube  RELEASE UPON ORDERING,   STAT       Question:  Specimen collection method  Answer:  IV Team=IV Team collect   04/07/21 1532   04/07/21 1532  Fungus Culture With Stain  RELEASE UPON ORDERING,   STAT        04/07/21 1532   04/07/21 1516  Culture, fungus without smear  Once,   R        04/07/21 1522   04/07/21 1516  Anaerobic culture w Gram Stain  Once,   R        04/07/21 1522   04/07/21 1516  Oligoclonal bands, CSF + serm  Once,   TIMED        04/07/21 1522   04/07/21 0933  Acid Fast Smear (AFB)  (AFB smear + Culture w reflexed sensitivities panel)  Once,   R       See Hyperspace for full Linked Orders Report.   04/07/21 0932   04/07/21 0933  Acid Fast Culture with reflexed sensitivities  (AFB smear + Culture w reflexed sensitivities panel)  Once,   R       See Hyperspace for full Linked Orders Report.   04/07/21 0932              DVT prophylaxis: Place and maintain sequential compression device Start: 04/10/21 1614   Code Status:  Full Family Communication: Family at bedside Disposition:   Status is: Inpatient  Dispo: The patient is from: home              Anticipated d/c is to: TBD              Anticipated d/c date is: TBD                Consultants:  Pulmonology ID Neurology hematology  Procedures:  bronchoscopy  Antimicrobials:   Anti-infectives (From admission, onward)    Start     Dose/Rate Route Frequency Ordered Stop   04/08/21 2000  meropenem (MERREM) 1 g in sodium chloride 0.9 % 100 mL IVPB  Status:  Discontinued        1 g 200 mL/hr over 30 Minutes Intravenous Every 8 hours 04/08/21 1347 04/08/21 1349   04/08/21 2000  cefTRIAXone (ROCEPHIN) 2 g in sodium chloride 0.9 % 100 mL IVPB  Status:  Discontinued        2 g 200 mL/hr over 30 Minutes Intravenous Every 24 hours 04/08/21 1349 04/10/21 0947   04/08/21 1000  cefTRIAXone (ROCEPHIN) 2 g in sodium chloride 0.9 % 100 mL IVPB  Status:  Discontinued        2 g 200 mL/hr over 30 Minutes Intravenous Every 24 hours 04/07/21 0905 04/07/21 0931   04/07/21 2200  acyclovir (ZOVIRAX) 800 mg in dextrose 5 % 150 mL IVPB  Status:  Discontinued        800 mg 166 mL/hr over 60 Minutes Intravenous Every 8 hours 04/07/21 1451 04/08/21 1341   04/07/21 2000  meropenem (MERREM) 2 g in sodium chloride 0.9 % 100 mL IVPB  Status:  Discontinued        2 g 200 mL/hr over 30 Minutes Intravenous Every 8 hours  04/07/21 1451 04/08/21 1347   04/07/21 1200  acyclovir (ZOVIRAX) 800 mg in dextrose 5 % 150 mL IVPB  Status:  Discontinued        800 mg 166 mL/hr over 60 Minutes Intravenous Every 8 hours 04/07/21 1037 04/07/21 1451   04/07/21 1100  vancomycin (VANCOREADY) IVPB 1000 mg/200 mL  Status:  Discontinued        1,000 mg 200 mL/hr over 60 Minutes Intravenous Every 12 hours 04/07/21 1037 04/08/21 1340   04/07/21 1030  meropenem (MERREM) 2 g in sodium chloride 0.9 % 100 mL IVPB  Status:  Discontinued        2 g 200 mL/hr over 30 Minutes Intravenous Every 8 hours  04/07/21 1010 04/07/21 1451   04/07/21 1000  cefTRIAXone (ROCEPHIN) 2 g in sodium chloride 0.9 % 100 mL IVPB  Status:  Discontinued        2 g 200 mL/hr over 30 Minutes Intravenous Every 12 hours 04/07/21 0931 04/07/21 0955   04/03/21 2200  vancomycin (VANCOREADY) IVPB 1750 mg/350 mL  Status:  Discontinued        1,750 mg 175 mL/hr over 120 Minutes Intravenous Every 24 hours 04/03/21 0850 04/04/21 0951   04/03/21 1000  vancomycin (VANCOREADY) IVPB 1500 mg/300 mL        1,500 mg 150 mL/hr over 120 Minutes Intravenous  Once 04/03/21 0850 04/03/21 1416   04/03/21 0900  ceFEPIme (MAXIPIME) 2 g in sodium chloride 0.9 % 100 mL IVPB  Status:  Discontinued        2 g 200 mL/hr over 30 Minutes Intravenous Every 8 hours 04/03/21 0850 04/07/21 0857   03/31/21 0200  cefTRIAXone (ROCEPHIN) 2 g in sodium chloride 0.9 % 100 mL IVPB  Status:  Discontinued        2 g 200 mL/hr over 30 Minutes Intravenous Every 24 hours 03/30/21 2051 04/03/21 0823   03/30/21 1630  azithromycin (ZITHROMAX) 500 mg in sodium chloride 0.9 % 250 mL IVPB  Status:  Discontinued        500 mg 250 mL/hr over 60 Minutes Intravenous Every 24 hours 03/30/21 1619 04/03/21 0823   03/30/21 1630  ceFEPIme (MAXIPIME) 2 g in sodium chloride 0.9 % 100 mL IVPB        2 g 200 mL/hr over 30 Minutes Intravenous  Once 03/30/21 1625 03/30/21 1724           Objective: Vitals:   04/10/21 1957 04/10/21 2350 04/11/21 0535 04/11/21 1300  BP: (!) 164/93 (!) 168/93 (!) 158/87 (!) 155/92  Pulse: 91 79 86 77  Resp: (!) 21 (!) 24  18  Temp: 99 F (37.2 C) 98.6 F (37 C) 98.6 F (37 C) 98.2 F (36.8 C)  TempSrc: Oral Oral Oral Oral  SpO2: 93% 95% 100% 100%  Weight:      Height:        Intake/Output Summary (Last 24 hours) at 04/11/2021 1919 Last data filed at 04/11/2021 1400 Gross per 24 hour  Intake 600 ml  Output --  Net 600 ml   Filed Weights   04/08/21 0550 04/09/21 0500 04/10/21 0500  Weight: 84.4 kg 87 kg 88.3 kg     Examination:  General exam: alert, following command, but very weak,  Respiratory system: overall diminished, but appear to have improved aeration compared to yesterday, no wheezing, no rhonchi, no rales, slightly tachypneic ,no accessory muscle use Cardiovascular system:  RRR.  Gastrointestinal system: Abdomen is nondistended, soft and nontender.  Normal bowel sounds heard. Central nervous system: Alert and oriented. No focal neurological deficits. Extremities:  no edema Skin: No rashes, lesions or ulcers Psychiatry: Very weak, but appear appropriate currently.     Data Reviewed: I have personally reviewed following labs and imaging studies  CBC: Recent Labs  Lab 04/06/21 0009 04/07/21 1519 04/09/21 0240 04/10/21 0339 04/11/21 0611  WBC 4.3 4.2 3.9* 5.6 5.0  NEUTROABS  --  1.9  --   --  2.3  HGB 8.2* 8.7* 8.0* 8.2* 7.8*  HCT 27.3* 29.4* 26.9* 27.4* 26.1*  MCV 86.1 88.3 87.6 87.3 87.9  PLT 114* 88* 73* 75* 66*    Basic Metabolic Panel: Recent Labs  Lab 04/06/21 0009 04/07/21 0100 04/07/21 1519 04/09/21 0240 04/10/21 0339 04/11/21 0611  NA 131*  --  131* 136 135 139  K 4.0  --  4.5 3.8 4.2 3.7  CL 95*  --  96* 99 98 98  CO2 33*  --  32 34* 34* 38*  GLUCOSE 111*  --  93 89 161* 101*  BUN 7*  --  _0 CREATININE 0.49 0.76 0.63 0.59 0.61 0.50  CALCIUM 8.1*  --  8.0* 8.0* 7.8* 8.1*  MG  --   --   --   --  1.9 2.0  PHOS  --   --   --   --  1.9* 1.6*    GFR: Estimated Creatinine Clearance: 80.8 mL/min (by C-G formula based on SCr of 0.5 mg/dL).  Liver Function Tests: Recent Labs  Lab 04/07/21 1519 04/09/21 0240 04/10/21 0339  AST 52* 65* 84*  ALT _1 ALKPHOS 73 96 169*  BILITOT 0.5 0.2* 0.5  PROT 8.2* 8.1 8.3*  ALBUMIN 2.2* 2.0* 2.1*    CBG: No results for input(s): GLUCAP in the last 168 hours.   Recent Results (from the past 240 hour(s))  Culture, blood (Routine X 2) w Reflex to ID Panel     Status: None   Collection Time:  04/02/21  6:26 PM   Specimen: BLOOD  Result Value Ref Range Status   Specimen Description   Final    BLOOD BLOOD LEFT HAND Performed at [facility] [address], [place], [place] [address]    Special Requests   Final    BOTTLES DRAWN AEROBIC ONLY Blood Culture adequate volume Performed at [facility] [address], [place], [place] [address]    Culture   Final    NO GROWTH 5 DAYS Performed at [facility], [place] [address], [place], [place] [address]    Report Status 04/07/2021 FINAL  Final  Culture, blood (Routine X 2) w Reflex to ID Panel     Status: None   Collection Time: 04/02/21  7:43 PM   Specimen: BLOOD LEFT WRIST  Result Value Ref Range Status   Specimen Description   Final    BLOOD LEFT WRIST Performed at [facility] [address] [place], [place] [address]    Special Requests   Final    BOTTLES DRAWN AEROBIC ONLY Blood Culture adequate volume Performed at [facility] [address], [place], [place] [address]    Culture   Final    NO GROWTH 5 DAYS Performed at [facility], [place] [address], [place], [place] [address]    Report Status 04/07/2021 FINAL  Final  MRSA Next Gen by PCR, Nasal  Status: None   Collection Time: 04/03/21 11:26 AM   Specimen: Nasal Mucosa; Nasal Swab  Result Value Ref Range Status   MRSA by PCR Next Gen NOT DETECTED NOT DETECTED Final    Comment: (NOTE) The GeneXpert MRSA Assay (FDA approved for NASAL specimens only), is one component of a comprehensive MRSA colonization surveillance program. It is not intended to diagnose MRSA infection nor to guide or monitor treatment for MRSA infections. Test performance is not FDA approved in patients less than 48 years old. Performed at [facility], [place] [address], [place], [place] [address]   Culture, blood (routine x 2)     Status: None   Collection Time: 04/05/21  3:24 PM    Specimen: BLOOD  Result Value Ref Range Status   Specimen Description   Final    BLOOD BLOOD LEFT HAND Performed at [facility] [address], [place], [place] [address]    Special Requests   Final    BOTTLES DRAWN AEROBIC ONLY Blood Culture adequate volume Performed at [facility] [address], [place], [place] [address]    Culture   Final    NO GROWTH 5 DAYS Performed at [facility], [place] [address] [place], [place] [address]    Report Status 04/10/2021 FINAL  Final  Culture, blood (routine x 2)     Status: None   Collection Time: 04/05/21  3:24 PM   Specimen: BLOOD  Result Value Ref Range Status   Specimen Description   Final    BLOOD BLOOD LEFT WRIST Performed at [facility] [address], [place], [place] [address]    Special Requests   Final    BOTTLES DRAWN AEROBIC ONLY Blood Culture adequate volume Performed at [facility] [address], [place], [place] [address]    Culture   Final    NO GROWTH 5 DAYS Performed at [facility], [place] [address], [place], [place] [address]    Report Status 04/10/2021 FINAL  Final  Expectorated Sputum Assessment w Gram Stain, Rflx to Resp Cult     Status: None   Collection Time: 04/06/21 10:00 AM   Specimen: Expectorated Sputum  Result Value Ref Range Status   Specimen Description EXPECTORATED SPUTUM  Final   Special Requests NONE  Final   Sputum evaluation   Final    THIS SPECIMEN IS ACCEPTABLE FOR SPUTUM CULTURE Performed at [facility], [place] [address], [place], [place] [address]    Report Status 04/06/2021 FINAL  Final  Culture, Respiratory w Gram Stain     Status: None   Collection Time: 04/06/21 10:00 AM  Result Value Ref Range Status   Specimen Description   Final    EXPECTORATED SPUTUM Performed at [facility], [place] [address] [place], [place] [address]    Special Requests   Final     NONE Reflexed from [phone] Performed at [facility], [place] [address] [place], [place] [address]    Gram Stain   Final    FEW SQUAMOUS EPITHELIAL CELLS PRESENT FEW WBC SEEN FEW YEAST MODERATE GRAM POSITIVE RODS MODERATE GRAM POSITIVE COCCI    Culture   Final    ABUNDANT Normal respiratory flora-no Staph aureus or Pseudomonas seen Performed at [facility], [address] [place],  [address]    Report Status 04/08/2021 FINAL  Final  Respiratory (~20 pathogens) panel by PCR  Status: None   Collection Time: 04/07/21  9:29 AM   Specimen: Nasopharyngeal Swab; Respiratory  Result Value Ref Range Status   Adenovirus NOT DETECTED NOT DETECTED Final   Coronavirus 229E NOT DETECTED NOT DETECTED Final    Comment: (NOTE) The Coronavirus on the Respiratory Panel, DOES NOT test for the novel  Coronavirus (2019 nCoV)    Coronavirus HKU1 NOT DETECTED NOT DETECTED Final   Coronavirus NL63 NOT DETECTED NOT DETECTED Final   Coronavirus OC43 NOT DETECTED NOT DETECTED Final   Metapneumovirus NOT DETECTED NOT DETECTED Final   Rhinovirus / Enterovirus NOT DETECTED NOT DETECTED Final   Influenza A NOT DETECTED NOT DETECTED Final   Influenza B NOT DETECTED NOT DETECTED Final   Parainfluenza Virus 1 NOT DETECTED NOT DETECTED Final   Parainfluenza Virus 2 NOT DETECTED NOT DETECTED Final   Parainfluenza Virus 3 NOT DETECTED NOT DETECTED Final   Parainfluenza Virus 4 NOT DETECTED NOT DETECTED Final   Respiratory Syncytial Virus NOT DETECTED NOT DETECTED Final   Bordetella pertussis NOT DETECTED NOT DETECTED Final   Bordetella Parapertussis NOT DETECTED NOT DETECTED Final   Chlamydophila pneumoniae NOT DETECTED NOT DETECTED Final   Mycoplasma pneumoniae NOT DETECTED NOT DETECTED Final    Comment: Performed at [facility], [place]. [address] [place], [place] [address]  CSF culture w Stat Gram Stain     Status: None   Collection Time: 04/07/21  2:28 PM    Specimen: CSF; Cerebrospinal Fluid  Result Value Ref Range Status   Specimen Description   Final    CSF Performed at [facility] [address] [place], [place] [address]    Special Requests   Final    NONE Performed at [facility], [place] [address], [place], [place] [address]    Gram Stain   Final    NO WBC SEEN NO ORGANISMS SEEN CYTOSPIN SMEAR Gram Stain Report Called to,Read Back By and Verified With: R.MICHAELS, RN AT 1850 ON 11.21.22 BY N.THOMPSON Performed at [facility] [address] [place], [place] [address]    Culture   Final    NO GROWTH Performed at [facility], [place] [address] [place], [place] [address]    Report Status 04/11/2021 FINAL  Final  Anaerobic culture w Gram Stain     Status: None (Preliminary result)   Collection Time: 04/07/21  2:28 PM   Specimen: PATH Cytology CSF; Cerebrospinal Fluid  Result Value Ref Range Status   Specimen Description   Final    CSF Performed at [facility] [address] [place], [place] [address]    Special Requests   Final    NONE Performed at [facility], [place] [address], [place], [place] [address]    Gram Stain PENDING  Incomplete   Culture   Final    NO ANAEROBES ISOLATED; CULTURE IN PROGRESS FOR 5 DAYS   Report Status PENDING  Incomplete  Pneumocystis smear by DFA     Status: None   Collection Time: 04/09/21  2:49 PM   Specimen: Bronchial Alveolar Lavage; Respiratory  Result Value Ref Range Status   Specimen Source-PJSRC BRONCHIAL ALVEOLAR LAVAGE  Final    Comment: RUL   Pneumocystis jiroveci Ag NEGATIVE  Final    Comment: Performed at [facility], [place] [address], [place], [place] [address]  Culture, Respiratory w Gram Stain     Status: None   Collection Time: 04/09/21  2:49 PM   Specimen: Bronchial Alveolar Lavage; Respiratory  Result Value Ref Range Status   Specimen Description    Final    BRONCHIAL ALVEOLAR LAVAGE RUL Performed at [facility] [address], [place], [place] [address]    Special Requests   Final    NONE Performed at [facility], [name] [address], [place], [place] [address]    Gram Stain   Final    FEW WBC PRESENT, PREDOMINANTLY MONONUCLEAR NO ORGANISMS SEEN    Culture   Final    NO GROWTH 2 DAYS Performed at [facility] [address] [place], [place] [address]    Report Status 04/11/2021 FINAL  Final  Anaerobic culture w Gram Stain     Status: None (Preliminary result)   Collection Time: 04/09/21  2:49 PM   Specimen: Bronchial Alveolar Lavage; Respiratory  Result Value Ref Range Status   Specimen Description   Final    BRONCHIAL ALVEOLAR LAVAGE RUL Performed at [facility] [address] [place] [address], [place], [place] [address]    Special Requests   Final    NONE Performed at [facility], [place] [address] [place], [place] [address]    Gram Stain   Final    FEW WBC PRESENT, PREDOMINANTLY MONONUCLEAR NO ORGANISMS SEEN Performed at [facility], [place] [address], [place], [place] [address]    Culture   Final    NO ANAEROBES ISOLATED; CULTURE IN PROGRESS FOR 5 DAYS   Report Status PENDING  Incomplete         Radiology Studies: No results found.      Scheduled Meds:  carvedilol  12.5 mg Oral BID WC   Chlorhexidine Gluconate Cloth  6 each Topical Daily   [START ON 04/12/2021] furosemide  20 mg Oral Daily   lactose free nutrition  237 mL Oral TID WC   mouth rinse  15 mL Mouth Rinse BID   melatonin  3 mg Oral QHS   mirtazapine  7.5 mg Oral QHS   multivitamin with minerals  1 tablet Oral Daily   phosphorus  500 mg Oral Daily   Ensure Max Protein  11 oz Oral BID   sacubitril-valsartan  1 tablet Oral BID   sodium chloride flush  3 mL Intravenous Q12H   [START ON 04/12/2021] spironolactone  12.5 mg Oral Daily   Continuous Infusions:      LOS: 11 days   Time spent: 45mns Greater than 50% of this time was spent in counseling, explanation of diagnosis, planning of further management, and coordination of care.   Voice Recognition /Viviann Sparedictation system was used to create this note, attempts have been made to correct errors. Please contact the author with questions and/or clarifications.   FFlorencia Reasons MD PhD FACP [facility]  Available via Epic secure chat 7am-7pm for nonurgent issues Please page for urgent issues To page the attending provider between 7A-7P or the covering provider during after hours 7P-7A, please log into the web site [email or web address] and access using universal [employer] password for that web site. If you do not have the password, please call the hospital operator.    04/11/2021, 7:19 PM

## 2021-04-11 NOTE — Progress Notes (Signed)
I discussed with microbiology lab and they were able to obtain a specimen that they can sign for AFB culture

## 2021-04-11 NOTE — Progress Notes (Signed)
Subjective: No new complaints   Antibiotics:  Anti-infectives (From admission, onward)    Start     Dose/Rate Route Frequency Ordered Stop   04/08/21 2000  meropenem (MERREM) 1 g in sodium chloride 0.9 % 100 mL IVPB  Status:  Discontinued        1 g 200 mL/hr over 30 Minutes Intravenous Every 8 hours 04/08/21 1347 04/08/21 1349   04/08/21 2000  cefTRIAXone (ROCEPHIN) 2 g in sodium chloride 0.9 % 100 mL IVPB  Status:  Discontinued        2 g 200 mL/hr over 30 Minutes Intravenous Every 24 hours 04/08/21 1349 04/10/21 0947   04/08/21 1000  cefTRIAXone (ROCEPHIN) 2 g in sodium chloride 0.9 % 100 mL IVPB  Status:  Discontinued        2 g 200 mL/hr over 30 Minutes Intravenous Every 24 hours 04/07/21 0905 04/07/21 0931   04/07/21 2200  acyclovir (ZOVIRAX) 800 mg in dextrose 5 % 150 mL IVPB  Status:  Discontinued        800 mg 166 mL/hr over 60 Minutes Intravenous Every 8 hours 04/07/21 1451 04/08/21 1341   04/07/21 2000  meropenem (MERREM) 2 g in sodium chloride 0.9 % 100 mL IVPB  Status:  Discontinued        2 g 200 mL/hr over 30 Minutes Intravenous Every 8 hours 04/07/21 1451 04/08/21 1347   04/07/21 1200  acyclovir (ZOVIRAX) 800 mg in dextrose 5 % 150 mL IVPB  Status:  Discontinued        800 mg 166 mL/hr over 60 Minutes Intravenous Every 8 hours 04/07/21 1037 04/07/21 1451   04/07/21 1100  vancomycin (VANCOREADY) IVPB 1000 mg/200 mL  Status:  Discontinued        1,000 mg 200 mL/hr over 60 Minutes Intravenous Every 12 hours 04/07/21 1037 04/08/21 1340   04/07/21 1030  meropenem (MERREM) 2 g in sodium chloride 0.9 % 100 mL IVPB  Status:  Discontinued        2 g 200 mL/hr over 30 Minutes Intravenous Every 8 hours 04/07/21 1010 04/07/21 1451   04/07/21 1000  cefTRIAXone (ROCEPHIN) 2 g in sodium chloride 0.9 % 100 mL IVPB  Status:  Discontinued        2 g 200 mL/hr over 30 Minutes Intravenous Every 12 hours 04/07/21 0931 04/07/21 0955   04/03/21 2200  vancomycin (VANCOREADY)  IVPB 1750 mg/350 mL  Status:  Discontinued        1,750 mg 175 mL/hr over 120 Minutes Intravenous Every 24 hours 04/03/21 0850 04/04/21 0951   04/03/21 1000  vancomycin (VANCOREADY) IVPB 1500 mg/300 mL        1,500 mg 150 mL/hr over 120 Minutes Intravenous  Once 04/03/21 0850 04/03/21 1416   04/03/21 0900  ceFEPIme (MAXIPIME) 2 g in sodium chloride 0.9 % 100 mL IVPB  Status:  Discontinued        2 g 200 mL/hr over 30 Minutes Intravenous Every 8 hours 04/03/21 0850 04/07/21 0857   03/31/21 0200  cefTRIAXone (ROCEPHIN) 2 g in sodium chloride 0.9 % 100 mL IVPB  Status:  Discontinued        2 g 200 mL/hr over 30 Minutes Intravenous Every 24 hours 03/30/21 2051 04/03/21 0823   03/30/21 1630  azithromycin (ZITHROMAX) 500 mg in sodium chloride 0.9 % 250 mL IVPB  Status:  Discontinued        500 mg 250 mL/hr over 60  Minutes Intravenous Every 24 hours 03/30/21 1619 04/03/21 0823   03/30/21 1630  ceFEPIme (MAXIPIME) 2 g in sodium chloride 0.9 % 100 mL IVPB        2 g 200 mL/hr over 30 Minutes Intravenous  Once 03/30/21 1625 03/30/21 1724       Medications: Scheduled Meds:  carvedilol  12.5 mg Oral BID WC   Chlorhexidine Gluconate Cloth  6 each Topical Daily   lactose free nutrition  237 mL Oral TID WC   mouth rinse  15 mL Mouth Rinse BID   melatonin  3 mg Oral QHS   mirtazapine  7.5 mg Oral QHS   multivitamin with minerals  1 tablet Oral Daily   phosphorus  500 mg Oral Daily   Ensure Max Protein  11 oz Oral BID   sacubitril-valsartan  1 tablet Oral BID   sodium chloride flush  3 mL Intravenous Q12H   Continuous Infusions: PRN Meds:.acetaminophen **OR** acetaminophen (TYLENOL) oral liquid 160 mg/5 mL, acetaminophen **OR** acetaminophen, albuterol, fentaNYL (SUBLIMAZE) injection, fentaNYL (SUBLIMAZE) injection, guaiFENesin-dextromethorphan, hydrALAZINE, hydrOXYzine, meperidine (DEMEROL) injection, ondansetron **OR** ondansetron (ZOFRAN) IV, ondansetron (ZOFRAN) IV, ondansetron (ZOFRAN) IV,  oxyCODONE **OR** oxyCODONE, senna-docusate, sodium chloride, sodium chloride flush    Objective: Weight change:   Intake/Output Summary (Last 24 hours) at 04/11/2021 1536 Last data filed at 04/11/2021 1400 Gross per 24 hour  Intake 760 ml  Output --  Net 760 ml    Blood pressure (!) 155/92, pulse 77, temperature 98.2 F (36.8 C), temperature source Oral, resp. rate 18, height 5' 6.14" (1.68 m), weight 88.3 kg, SpO2 100 %. Temp:  [98.2 F (36.8 C)-99 F (37.2 C)] 98.2 F (36.8 C) (11/25 1300) Pulse Rate:  [77-91] 77 (11/25 1300) Resp:  [18-26] 18 (11/25 1300) BP: (153-168)/(75-93) 155/92 (11/25 1300) SpO2:  [93 %-100 %] 100 % (11/25 1300)  Physical Exam: Physical Exam Constitutional:      General: She is not in acute distress.    Appearance: She is well-developed. She is not diaphoretic.  HENT:     Head: Normocephalic and atraumatic.     Right Ear: External ear normal.     Left Ear: External ear normal.     Mouth/Throat:     Pharynx: No oropharyngeal exudate.  Eyes:     General: No scleral icterus.    Conjunctiva/sclera: Conjunctivae normal.     Pupils: Pupils are equal, round, and reactive to light.  Cardiovascular:     Rate and Rhythm: Normal rate and regular rhythm.     Heart sounds: Normal heart sounds. No murmur heard.   No friction rub. No gallop.  Pulmonary:     Effort: Pulmonary effort is normal. No respiratory distress.     Breath sounds: Normal breath sounds. No stridor. No wheezing or rales.  Abdominal:     General: Bowel sounds are normal. There is no distension.     Palpations: Abdomen is soft.     Tenderness: There is no abdominal tenderness. There is no rebound.  Musculoskeletal:        General: No tenderness. Normal range of motion.  Lymphadenopathy:     Cervical: No cervical adenopathy.  Skin:    General: Skin is warm and dry.     Coloration: Skin is not pale.     Findings: No erythema or rash.  Neurological:     General: No focal deficit  present.     Mental Status: She is alert and oriented to person, place, and time.  Motor: No abnormal muscle tone.     Coordination: Coordination normal.  Psychiatric:        Mood and Affect: Mood normal.        Behavior: Behavior normal.        Thought Content: Thought content normal.        Judgment: Judgment normal.    Her Port-A-Cath site is clean  CBC:    BMET Recent Labs    04/10/21 0339 04/11/21 0611  NA 135 139  K 4.2 3.7  CL 98 98  CO2 34* 38*  GLUCOSE 161* 101*  BUN 10 11  CREATININE 0.61 0.50  CALCIUM 7.8* 8.1*      Liver Panel  Recent Labs    04/09/21 0240 04/10/21 0339  PROT 8.1 8.3*  ALBUMIN 2.0* 2.1*  AST 65* 84*  ALT 24 28  ALKPHOS 96 169*  BILITOT 0.2* 0.5        Sedimentation Rate No results for input(s): ESRSEDRATE in the last 72 hours. C-Reactive Protein No results for input(s): CRP in the last 72 hours.  Micro Results: Recent Results (from the past 720 hour(s))  Blood culture (routine x 2)     Status: None   Collection Time: 03/28/21  5:32 PM   Specimen: BLOOD  Result Value Ref Range Status   Specimen Description   Final    BLOOD LEFT ANTECUBITAL Performed at [facility] [address], [place], [place] [address]    Special Requests   Final    BOTTLES DRAWN AEROBIC AND ANAEROBIC Blood Culture results may not be optimal due to an inadequate volume of blood received in culture bottles Performed at [facility] [address] [place], [place] [address]    Culture   Final    NO GROWTH 5 DAYS Performed at [facility], [place] [address], [place],  [address]    Report Status 04/02/2021 FINAL  Final  Resp Panel by RT-PCR (Flu A&B, Covid) Nasopharyngeal Swab     Status: None   Collection Time: 03/28/21  5:32 PM   Specimen: Nasopharyngeal Swab; Nasopharyngeal(NP) swabs in vial transport medium  Result Value Ref Range Status   SARS Coronavirus 2 by RT PCR NEGATIVE NEGATIVE  Final    Comment: (NOTE) SARS-CoV-2 target nucleic acids are NOT DETECTED.  The SARS-CoV-2 RNA is generally detectable in upper respiratory specimens during the acute phase of infection. The lowest concentration of SARS-CoV-2 viral copies this assay can detect is 138 copies/mL. A negative result does not preclude SARS-Cov-2 infection and should not be used as the sole basis for treatment or other patient management decisions. A negative result may occur with  improper specimen collection/handling, submission of specimen other than nasopharyngeal swab, presence of viral mutation(s) within the areas targeted by this assay, and inadequate number of viral copies(<138 copies/mL). A negative result must be combined with clinical observations, patient history, and epidemiological information. The expected result is Negative.  Fact Sheet for Patients:  EntrepreneurPulse.com.au  Fact Sheet for Healthcare Providers:  IncredibleEmployment.be  This test is no t yet approved or cleared by the [place] FDA and  has been authorized for detection and/or diagnosis of SARS-CoV-2 by FDA under an Emergency Use Authorization (EUA). This EUA will remain  in effect (meaning this test can be used) for the duration of the COVID-19 declaration under Section 564(b)(1) of the Act, 21 U.S.C.section 360bbb-3(b)(1), unless the authorization is terminated  or revoked sooner.  Influenza A by PCR NEGATIVE NEGATIVE Final   Influenza B by PCR NEGATIVE NEGATIVE Final    Comment: (NOTE) The Xpert Xpress SARS-CoV-2/FLU/RSV plus assay is intended as an aid in the diagnosis of influenza from Nasopharyngeal swab specimens and should not be used as a sole basis for treatment. Nasal washings and aspirates are unacceptable for Xpert Xpress SARS-CoV-2/FLU/RSV testing.  Fact Sheet for Patients: EntrepreneurPulse.com.au  Fact Sheet for Healthcare  Providers: IncredibleEmployment.be  This test is not yet approved or cleared by the [place] FDA and has been authorized for detection and/or diagnosis of SARS-CoV-2 by FDA under an Emergency Use Authorization (EUA). This EUA will remain in effect (meaning this test can be used) for the duration of the COVID-19 declaration under Section 564(b)(1) of the Act, 21 U.S.C. section 360bbb-3(b)(1), unless the authorization is terminated or revoked.  Performed at [facility], [place] [address], [place], [place] [address]   Blood culture (routine x 2)     Status: None   Collection Time: 03/28/21  5:37 PM   Specimen: BLOOD  Result Value Ref Range Status   Specimen Description   Final    BLOOD BLOOD LEFT WRIST Performed at [facility] [address] [place], [place] [address]    Special Requests   Final    BOTTLES DRAWN AEROBIC AND ANAEROBIC Blood Culture results may not be optimal due to an inadequate volume of blood received in culture bottles Performed at [facility] [address] [place], [place] [address]    Culture   Final    NO GROWTH 5 DAYS Performed at [facility], [place] [address], [place], [place] [address]    Report Status 04/02/2021 FINAL  Final  Resp Panel by RT-PCR (Flu A&B, Covid) Nasopharyngeal Swab     Status: None   Collection Time: 03/30/21  2:39 PM   Specimen: Nasopharyngeal Swab; Nasopharyngeal(NP) swabs in vial transport medium  Result Value Ref Range Status   SARS Coronavirus 2 by RT PCR NEGATIVE NEGATIVE Final    Comment: (NOTE) SARS-CoV-2 target nucleic acids are NOT DETECTED.  The SARS-CoV-2 RNA is generally detectable in upper respiratory specimens during the acute phase of infection. The lowest concentration of SARS-CoV-2 viral copies this assay can detect is 138 copies/mL. A negative result does not preclude SARS-Cov-2 infection and should not be used as the sole  basis for treatment or other patient management decisions. A negative result may occur with  improper specimen collection/handling, submission of specimen other than nasopharyngeal swab, presence of viral mutation(s) within the areas targeted by this assay, and inadequate number of viral copies(<138 copies/mL). A negative result must be combined with clinical observations, patient history, and epidemiological information. The expected result is Negative.  Fact Sheet for Patients:  EntrepreneurPulse.com.au  Fact Sheet for Healthcare Providers:  IncredibleEmployment.be  This test is no t yet approved or cleared by the [place] FDA and  has been authorized for detection and/or diagnosis of SARS-CoV-2 by FDA under an Emergency Use Authorization (EUA). This EUA will remain  in effect (meaning this test can be used) for the duration of the COVID-19 declaration under Section 564(b)(1) of the Act, 21 U.S.C.section 360bbb-3(b)(1), unless the authorization is terminated  or revoked sooner.       Influenza A by PCR NEGATIVE NEGATIVE Final   Influenza B by PCR NEGATIVE NEGATIVE Final    Comment: (NOTE) The Xpert Xpress SARS-CoV-2/FLU/RSV plus assay is intended as an aid in  the diagnosis of influenza from Nasopharyngeal swab specimens and should not be used as a sole basis for treatment. Nasal washings and aspirates are unacceptable for Xpert Xpress SARS-CoV-2/FLU/RSV testing.  Fact Sheet for Patients: EntrepreneurPulse.com.au  Fact Sheet for Healthcare Providers: IncredibleEmployment.be  This test is not yet approved or cleared by the [place] FDA and has been authorized for detection and/or diagnosis of SARS-CoV-2 by FDA under an Emergency Use Authorization (EUA). This EUA will remain in effect (meaning this test can be used) for the duration of the COVID-19 declaration under Section 564(b)(1) of the Act,  21 U.S.C. section 360bbb-3(b)(1), unless the authorization is terminated or revoked.  Performed at [facility], [place] [address], [place], [place] [address]   Culture, blood (Routine x 2)     Status: None   Collection Time: 03/30/21  3:20 PM   Specimen: BLOOD  Result Value Ref Range Status   Specimen Description   Final    BLOOD PORTA CATH Performed at [facility] [address], [place], [place] [address]    Special Requests   Final    BOTTLES DRAWN AEROBIC AND ANAEROBIC Blood Culture adequate volume Performed at [facility] [address] [place], [place] [address]    Culture   Final    NO GROWTH 5 DAYS Performed at [facility], [place] [address], [place], [place] [address]    Report Status 04/04/2021 FINAL  Final  Culture, blood (Routine x 2)     Status: None   Collection Time: 03/30/21  3:27 PM   Specimen: BLOOD  Result Value Ref Range Status   Specimen Description   Final    BLOOD LEFT ANTECUBITAL Performed at [facility] [address] [place], [place] [address]    Special Requests   Final    BOTTLES DRAWN AEROBIC AND ANAEROBIC Blood Culture results may not be optimal due to an excessive volume of blood received in culture bottles Performed at [facility] [address], [place], [place] [address]    Culture   Final    NO GROWTH 5 DAYS Performed at [facility], [place] [address], [place], [place] [address]    Report Status 04/04/2021 FINAL  Final  Culture, blood (Routine X 2) w Reflex to ID Panel     Status: None   Collection Time: 04/02/21  6:26 PM   Specimen: BLOOD  Result Value Ref Range Status   Specimen Description   Final    BLOOD BLOOD LEFT HAND Performed at [facility] [address], [place], [place] [address]    Special Requests   Final    BOTTLES DRAWN AEROBIC ONLY Blood Culture adequate volume Performed at [facility] [address], [place], [place] [address]    Culture   Final    NO GROWTH 5 DAYS Performed at [facility], [place] [address], [place], [name] [address]    Report Status 04/07/2021 FINAL  Final  Culture, blood (Routine X 2) w Reflex to ID Panel     Status: None   Collection Time: 04/02/21  7:43 PM   Specimen: BLOOD LEFT WRIST  Result Value Ref Range Status   Specimen Description   Final    BLOOD LEFT WRIST Performed at [facility] [address], [place], [place] [address]    Special Requests   Final    BOTTLES DRAWN AEROBIC ONLY  Blood Culture adequate volume Performed at [facility] [address], [place], [place] [address]    Culture   Final    NO GROWTH 5 DAYS Performed at [facility], [place] [address] [place], [place] [address]    Report Status 04/07/2021 FINAL  Final  MRSA Next Gen by PCR, Nasal     Status: None   Collection Time: 04/03/21 11:26 AM   Specimen: Nasal Mucosa; Nasal Swab  Result Value Ref Range Status   MRSA by PCR Next Gen NOT DETECTED NOT DETECTED Final    Comment: (NOTE) The GeneXpert MRSA Assay (FDA approved for NASAL specimens only), is one component of a comprehensive MRSA colonization surveillance program. It is not intended to diagnose MRSA infection nor to guide or monitor treatment for MRSA infections. Test performance is not FDA approved in patients less than 73 years old. Performed at [facility], [place] [address] [place], [place] [address]   Culture, blood (routine x 2)     Status: None   Collection Time: 04/05/21  3:24 PM   Specimen: BLOOD  Result Value Ref Range Status   Specimen Description   Final    BLOOD BLOOD LEFT HAND Performed at [facility] [address] [place], [place] [address]    Special Requests   Final    BOTTLES DRAWN AEROBIC ONLY Blood Culture adequate volume Performed at [facility] [address], [place], [place] [address]    Culture   Final    NO GROWTH 5 DAYS Performed at [facility], [place] [address] [place], [place] [address]    Report Status 04/10/2021 FINAL  Final  Culture, blood (routine x 2)     Status: None   Collection Time: 04/05/21  3:24 PM   Specimen: BLOOD  Result Value Ref Range Status   Specimen Description   Final    BLOOD BLOOD LEFT WRIST Performed at [facility] [address], [place], [place] [address]    Special Requests   Final    BOTTLES DRAWN AEROBIC ONLY Blood Culture adequate volume Performed at [facility] [address] [place], [place] [address]    Culture   Final    NO GROWTH 5 DAYS Performed at [facility], [place] [address] [place], [place] [address]    Report Status 04/10/2021 FINAL  Final  Expectorated Sputum Assessment w Gram Stain, Rflx to Resp Cult     Status: None   Collection Time: 04/06/21 10:00 AM   Specimen: Expectorated Sputum  Result Value Ref Range Status   Specimen Description EXPECTORATED SPUTUM  Final   Special Requests NONE  Final   Sputum evaluation   Final    THIS SPECIMEN IS ACCEPTABLE FOR SPUTUM CULTURE Performed at [facility], [place] [address], [place], [place] [address]    Report Status 04/06/2021 FINAL  Final  Culture, Respiratory w Gram Stain     Status: None   Collection Time: 04/06/21 10:00 AM  Result Value Ref Range Status   Specimen Description   Final    EXPECTORATED SPUTUM Performed at [facility], [place] [address] [place], [place] [address]    Special Requests   Final    NONE Reflexed from [id number] Performed at [facility], [place] [address], [place], [place] [address]    Gram Stain   Final    FEW SQUAMOUS EPITHELIAL CELLS  PRESENT FEW WBC SEEN FEW YEAST MODERATE GRAM POSITIVE RODS MODERATE GRAM POSITIVE COCCI    Culture   Final    ABUNDANT Normal respiratory flora-no  Staph aureus or Pseudomonas seen Performed at [facility], [address], [place], [place] [address]    Report Status 04/08/2021 FINAL  Final  Respiratory (~20 pathogens) panel by PCR     Status: None   Collection Time: 04/07/21  9:29 AM   Specimen: Nasopharyngeal Swab; Respiratory  Result Value Ref Range Status   Adenovirus NOT DETECTED NOT DETECTED Final   Coronavirus 229E NOT DETECTED NOT DETECTED Final    Comment: (NOTE) The Coronavirus on the Respiratory Panel, DOES NOT test for the novel  Coronavirus (2019 nCoV)    Coronavirus HKU1 NOT DETECTED NOT DETECTED Final   Coronavirus NL63 NOT DETECTED NOT DETECTED Final   Coronavirus OC43 NOT DETECTED NOT DETECTED Final   Metapneumovirus NOT DETECTED NOT DETECTED Final   Rhinovirus / Enterovirus NOT DETECTED NOT DETECTED Final   Influenza A NOT DETECTED NOT DETECTED Final   Influenza B NOT DETECTED NOT DETECTED Final   Parainfluenza Virus 1 NOT DETECTED NOT DETECTED Final   Parainfluenza Virus 2 NOT DETECTED NOT DETECTED Final   Parainfluenza Virus 3 NOT DETECTED NOT DETECTED Final   Parainfluenza Virus 4 NOT DETECTED NOT DETECTED Final   Respiratory Syncytial Virus NOT DETECTED NOT DETECTED Final   Bordetella pertussis NOT DETECTED NOT DETECTED Final   Bordetella Parapertussis NOT DETECTED NOT DETECTED Final   Chlamydophila pneumoniae NOT DETECTED NOT DETECTED Final   Mycoplasma pneumoniae NOT DETECTED NOT DETECTED Final    Comment: Performed at [facility], [place]. [address] [place], [place] [address]  CSF culture w Stat Gram Stain     Status: None   Collection Time: 04/07/21  2:28 PM   Specimen: CSF; Cerebrospinal Fluid  Result Value Ref Range Status   Specimen Description   Final    CSF Performed at [facility] [address] [place], [place] [address]    Special Requests   Final    NONE Performed at [facility], [place] [address] [place], [place] [address]    Gram  Stain   Final    NO WBC SEEN NO ORGANISMS SEEN CYTOSPIN SMEAR Gram Stain Report Called to,Read Back By and Verified With: R.MICHAELS, RN AT 1850 ON 11.21.22 BY N.THOMPSON Performed at [facility] [address], [place], [place] [address]    Culture   Final    NO GROWTH Performed at [facility], [place] [address] [place], [place] [address]    Report Status 04/11/2021 FINAL  Final  Anaerobic culture w Gram Stain     Status: None (Preliminary result)   Collection Time: 04/07/21  2:28 PM   Specimen: PATH Cytology CSF; Cerebrospinal Fluid  Result Value Ref Range Status   Specimen Description   Final    CSF Performed at [facility] [address] [place], [place] [address]    Special Requests   Final    NONE Performed at [facility], [place] [address] [place], [place] [address]    Gram Stain PENDING  Incomplete   Culture   Final    NO ANAEROBES ISOLATED; CULTURE IN PROGRESS FOR 5 DAYS   Report Status PENDING  Incomplete  Pneumocystis smear by DFA     Status: None   Collection Time: 04/09/21  2:49 PM  Specimen: Bronchial Alveolar Lavage; Respiratory  Result Value Ref Range Status   Specimen Source-PJSRC BRONCHIAL ALVEOLAR LAVAGE  Final    Comment: RUL   Pneumocystis jiroveci Ag NEGATIVE  Final    Comment: Performed at [facility], [place] [address] [place], [place] [address]  Culture, Respiratory w Gram Stain     Status: None   Collection Time: 04/09/21  2:49 PM   Specimen: Bronchial Alveolar Lavage; Respiratory  Result Value Ref Range Status   Specimen Description   Final    BRONCHIAL ALVEOLAR LAVAGE RUL Performed at [facility] [address] [place], [place] [address]    Special Requests   Final    NONE Performed at [facility], [place] [address], [place], [place] [address]    Gram Stain   Final    FEW WBC PRESENT, PREDOMINANTLY MONONUCLEAR NO  ORGANISMS SEEN    Culture   Final    NO GROWTH 2 DAYS Performed at [facility] [address], [place], [place] [address]    Report Status 04/11/2021 FINAL  Final  Anaerobic culture w Gram Stain     Status: None (Preliminary result)   Collection Time: 04/09/21  2:49 PM   Specimen: Bronchial Alveolar Lavage; Respiratory  Result Value Ref Range Status   Specimen Description   Final    BRONCHIAL ALVEOLAR LAVAGE RUL Performed at [facility] [address], [place], [place] [address]    Special Requests   Final    NONE Performed at [facility], [place] [address], [place], [place] [address]    Gram Stain   Final    FEW WBC PRESENT, PREDOMINANTLY MONONUCLEAR NO ORGANISMS SEEN Performed at [facility], [place] [address] [place], [place] [address]    Culture   Final    NO ANAEROBES ISOLATED; CULTURE IN PROGRESS FOR 5 DAYS   Report Status PENDING  Incomplete    Studies/Results: DG Chest Port 1 View  Result Date: 04/09/2021 CLINICAL DATA:  Status post bronchoscopy, previous abnormal chest x-ray EXAM: PORTABLE CHEST 1 VIEW COMPARISON:  04/05/2021 FINDINGS: Single frontal view of the chest demonstrates stable left chest wall port. Cardiac silhouette is unremarkable. Progression of the consolidation within the right upper lobe, likely representing post bronchoscopy changes. This could reflect edema or hemorrhage. The multifocal ground-glass airspace disease seen elsewhere throughout the lungs is stable. No large effusion or pneumothorax. No acute bony abnormalities. IMPRESSION: 1. Persistent bilateral ground-glass airspace disease consistent with edema or infection. Increased density in the right upper lobe likely represents post bronchoscopy changes, either lavage fluid or hemorrhage. 2. No evidence of pneumothorax. Electronically Signed   By: Randa Ngo M.D.   On: 04/09/2021 16:19      Assessment/Plan:  INTERVAL HISTORY: Cultures  unrevealing and the patient is stable off antibiotic  Principal Problem:   Pneumonitis Active Problems:   Anemia   Mediastinal lymphadenopathy   Malignant neoplasm of upper-outer quadrant of right breast in female, estrogen receptor negative (HCC)   HFrEF (heart failure with reduced ejection fraction) (HCC)   Hyponatremia   Thrombocytopenia (HCC)   Acute respiratory failure (HCC)   Malnutrition of moderate degree   Acute metabolic encephalopathy   Community acquired pneumonia   Lung mass    Rochele Lueck is a 65 y.o. female with metastatic breast cancer who was admitted for pneumonia with multifocal infiltrates.  She was to be receiving broad-spectrum antibiotics but still having intermittent  fevers.  CT of the chest that showed patchy bilateral infiltrates.  She had underwent lumbar puncture which was benign.  Dr. Baxter Flattery had narrowed her to ceftriaxone.  She then underwent bronchoscopy.  The bronchoscopy report describes relatively normal airways without purulence.  Sputum cultures and BAL cultures are unremarkable so far. PCP DFA is negative.  Fungal cultures and Aspergillus antigen pending she has an AFB culture from sputum but I do not see that the BAL was sent for AFB culture and microbiology have confirmed this.  They will try to send out specimen if they have remaining specimen to send  Biopsies were also taken.  I am happy to make her an appt in ID clinic to review micro findings but she would prefer to have appt made only if needed.  AFB cultures will take 6 weeks to finalize  [facility] are considering steroid trial.  I will update info re the AFB cultures as to whether they can send or not  I spent more than 36 minutes with the patient including  face to face counseling of the patient regarding her acute respiratory failure with bilateral infiltrates, differential diagnosis for this treatment course plan with review of her chest x-ray CT scan updated cultures review of  medical records in preparation for the visit and during the visit and in coordination of her care with microbiology lab.  I will sign off for now please call with further questions.      LOS: 11 days   Alcide Evener 04/11/2021, 3:36 PM

## 2021-04-12 LAB — IRON AND TIBC
Iron: 53 ug/dL (ref 28–170)
Saturation Ratios: 27 % (ref 10.4–31.8)
TIBC: 199 ug/dL — ABNORMAL LOW (ref 250–450)
UIBC: 146 ug/dL

## 2021-04-12 LAB — VITAMIN B12: Vitamin B-12: 451 pg/mL (ref 180–914)

## 2021-04-12 LAB — BASIC METABOLIC PANEL
Anion gap: 3 — ABNORMAL LOW (ref 5–15)
BUN: 9 mg/dL (ref 8–23)
CO2: 38 mmol/L — ABNORMAL HIGH (ref 22–32)
Calcium: 8 mg/dL — ABNORMAL LOW (ref 8.9–10.3)
Chloride: 96 mmol/L — ABNORMAL LOW (ref 98–111)
Creatinine, Ser: 0.5 mg/dL (ref 0.44–1.00)
GFR, Estimated: >60 mL/min (ref >60)
Glucose, Bld: 92 mg/dL (ref 70–99)
Potassium: 3.6 mmol/L (ref 3.5–5.1)
Sodium: 137 mmol/L (ref 135–145)

## 2021-04-12 LAB — CBC
HCT: 25.5 % — ABNORMAL LOW (ref 36.0–46.0)
Hemoglobin: 7.5 g/dL — ABNORMAL LOW (ref 12.0–15.0)
MCH: 26.3 pg (ref 26.0–34.0)
MCHC: 29.4 g/dL — ABNORMAL LOW (ref 30.0–36.0)
MCV: 89.5 fL (ref 80.0–100.0)
Platelets: 62 10*3/uL — ABNORMAL LOW (ref 150–400)
RBC: 2.85 MIL/uL — ABNORMAL LOW (ref 3.87–5.11)
RDW: 18.2 % — ABNORMAL HIGH (ref 11.5–15.5)
WBC: 5.1 10*3/uL (ref 4.0–10.5)
nRBC: 0.4 % — ABNORMAL HIGH (ref 0.0–0.2)

## 2021-04-12 LAB — RETICULOCYTES
Immature Retic Fract: 33.1 % — ABNORMAL HIGH (ref 2.3–15.9)
RBC.: 2.87 MIL/uL — ABNORMAL LOW (ref 3.87–5.11)
Retic Count, Absolute: 71.8 10*3/uL (ref 19.0–186.0)
Retic Ct Pct: 2.5 % (ref 0.4–3.1)

## 2021-04-12 LAB — DIRECT ANTIGLOBULIN TEST (NOT AT ARMC)
DAT, IgG: POSITIVE
DAT, complement: NEGATIVE

## 2021-04-12 LAB — FERRITIN: Ferritin: 213 ng/mL (ref 11–307)

## 2021-04-12 LAB — PHOSPHORUS: Phosphorus: 3.4 mg/dL (ref 2.5–4.6)

## 2021-04-12 LAB — LACTATE DEHYDROGENASE: LDH: 197 U/L — ABNORMAL HIGH (ref 98–192)

## 2021-04-12 NOTE — Progress Notes (Signed)
PT Cancellation Note  Patient Details Name: Kele Withem MRN: [id number] DOB: August 02, 1955   Cancelled Treatment:     therapist arrived to unit late in afternoon.  Pt resting comfortably and was too tiered to attempt amb "at this time".  Pt has been evaluated with rec for [facility] PT.  Will continue to monitor and attempt another day as schedule permits.   Rica Koyanagi  PTA Acute  Rehabilitation Services Pager      [phone] Office      [phone]

## 2021-04-12 NOTE — Progress Notes (Signed)
PROGRESS NOTE    Marie Church  WUJ:[id number] DOB: Oct 03, 1955 DOA: 03/30/2021 PCP: System, Provider Not In    Chief Complaint  Patient presents with   Pneumonia    Brief Narrative:  Marie Church is a 65 y.o. 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 on active Kadcyla and radiation treatment, HFrEF (EF improved from 20-25% to 50%), T2DM, HTN, anemia/thrombocytopenia who presented to the ED for evaluation of shortness of breath  Subjective:  She remains very weak but  confusion has resolved Denies cough today, no external bleeding  no chest pain, no edema, no fever, she remains on o2 supplement Has poor appetite only ate 20% , but does drink boost    Assessment & Plan:   Principal Problem:   Pneumonitis Active Problems:   Anemia   Mediastinal lymphadenopathy   Malignant neoplasm of upper-outer quadrant of right breast in female, estrogen receptor negative (HCC)   HFrEF (heart failure with reduced ejection fraction) (HCC)   Hyponatremia   Thrombocytopenia ([place])   Acute respiratory failure (HCC)   Malnutrition of moderate degree   Acute metabolic encephalopathy   Community acquired pneumonia   Lung mass   Acute respiratory failure with hypoxia, sepsis secondary to bilateral pneumonia present on admission, failed outpatient treatment for pneumonia. -CT angiogram of the chest on 11/13 negative for PE, but showed diffuse ground glass opacities which is persistent on repeat ct chest on 11/21 -COVID-19 PCR, influenza PCR is negative, urine for strep pneumonia is negative,.blood cultures negative till date.  -Differentials include radiation pneumonitis vs metastatic disease from breast cancer.  -s/p bronchoscopy on 11/23, f/u on culture result -she was on Vancomycin cefepime and acyclovir then  Rocephin , abx discontinued on 11/24 per ID recommendation -No fever in the last few days -- appreciate PCCM  and ID input  Acute toxic /metabolic encephalopathy -MRI brain without contrast is negative for acute intracranial abnormality. -Ammonia level is 24, TSH is within normal limits -Abg with mild co2 retention, on bipap qhs Neurology consulted, mri brain no acute findings, eeg unremarkable, continue treating underline metabolic cause, neurology singed off Confusion appears has resolved    Aspiration risk mild  Initially speech recommends thin liquid with small sips and bites. Confusion appear has resolved, appear tolerating regular diet   Hyponatremia Suspect hypovolemic hyponatremia in the setting of dehydration. Resolved  Hypophosphatemia Normalized, monitor as needed    Chronic systolic heart failure/history of nonischemic cardiomyopathy. Echocardiogram on 03/03/2021 showed improvement in left ventricular ejection fraction to 50% compared to the previous echo showing left ventricular ejection fraction at 20 to 25%. Continue with Coreg at 12.5 mg twice daily, Entresto 24-26 mg twice daily.  Lasix and spironolactone held due to hypotension initially, now bp start to trend up,  resume on 11/26 She states does not take farxiga       Metastatic right-sided breast cancer with axillary node involvement Patient follows up with [facility] oncology, currently on active chemotherapy with Kadcyla with last treatment on 03/19/2021 Patient also undergoing radiation treatment through radiation oncology at [facility]. She has one more week of radiation treatment.  She has not received any radiation this week as the radiation machine has been not working.   Anemia/thrombocytopenia Probably secondary to chemotherapy Continue to monitor counts. Hemoglobin stable around 8 and platelet counts worsening, hematology consulted  Anxiety She wants to try one does benadryl now, she wants to continue hydroxyzine at night, she thinks hydroxyzine is too strong for  her during the day  Start remeron for appetite,  continue nutrition supplement  FTT, start PT eval, increase activity   Nutritional Assessment:  The patient's BMI is: Body mass index is 28.7 kg/m.Marland Kitchen  Seen by dietician.  I agree with the assessment and plan as outlined below:  Nutrition Status: Nutrition Problem: Moderate Malnutrition Etiology: chronic illness, cancer and cancer related treatments Signs/Symptoms: mild fat depletion, mild muscle depletion Interventions: Boost Plus, MVI, Premier Protein  .     Unresulted Labs (From admission, onward)     Start     Ordered   04/12/21 0500  Heparin induced platelet Ab (HIT antibody)  Tomorrow morning,   R       Question:  Specimen collection method  Answer:  IV Team=IV Team collect   04/11/21 1058   04/12/21 0500  Haptoglobin  Tomorrow morning,   R       Question:  Specimen collection method  Answer:  IV Team=IV Team collect   04/11/21 1643   04/12/21 0500  CBC  Daily,   R     Question:  Specimen collection method  Answer:  IV Team=IV Team collect   04/11/21 1919   04/09/21 1452  Aspergillus Ag, BAL/Serum  RELEASE UPON ORDERING,   TIMED       Comments: Specimen C: Phone [phone]         Previous Biopsy:  no Is the patient on airborne/droplet precautions? No           Clinical History:  N/A Copy of Report to:  N/A Specimen Disposition: Cytology     04/09/21 1452   04/09/21 1452  Fungus Culture With Stain  RELEASE UPON ORDERING,   TIMED       Comments: Specimen C: Phone [phone]         Previous Biopsy:  no Is the patient on airborne/droplet precautions? No           Clinical History:  N/A Copy of Report to:  N/A Specimen Disposition: Cytology     04/09/21 1452   04/09/21 1449  Acid Fast Culture with reflexed sensitivities  Once,   R        04/09/21 1449   04/07/21 1532  Draw extra clot tube  RELEASE UPON ORDERING,   STAT       Question:  Specimen collection method  Answer:  IV Team=IV Team collect   04/07/21 1532   04/07/21 1532  Fungus Culture With Stain   RELEASE UPON ORDERING,   STAT        04/07/21 1532   04/07/21 1516  Culture, fungus without smear  Once,   R        04/07/21 1522   04/07/21 1516  Anaerobic culture w Gram Stain  Once,   R        04/07/21 1522   04/07/21 1516  Oligoclonal bands, CSF + serm  Once,   TIMED        04/07/21 1522   04/07/21 0933  Acid Fast Smear (AFB)  (AFB smear + Culture w reflexed sensitivities panel)  Once,   R       See Hyperspace for full Linked Orders Report.   04/07/21 0932   04/07/21 0933  Acid Fast Culture with reflexed sensitivities  (AFB smear + Culture w reflexed sensitivities panel)  Once,   R       See Hyperspace for full Linked Orders Report.   04/07/21 0932  DVT prophylaxis: Place and maintain sequential compression device Start: 04/10/21 1614   Code Status: Full Family Communication: Family at bedside Disposition:   Status is: Inpatient  Dispo: The patient is from: home              Anticipated d/c is to: Previous PT eval recommending home with home health, she appears very weak to me, need home O2 eval, likely will need to go home with home O2              Anticipated d/c date is: When platelet number improves, need clearance from pulmonology and hematology                Consultants:  Pulmonology ID Neurology hematology  Procedures:  bronchoscopy  Antimicrobials:   Anti-infectives (From admission, onward)    Start     Dose/Rate Route Frequency Ordered Stop   04/08/21 2000  meropenem (MERREM) 1 g in sodium chloride 0.9 % 100 mL IVPB  Status:  Discontinued        1 g 200 mL/hr over 30 Minutes Intravenous Every 8 hours 04/08/21 1347 04/08/21 1349   04/08/21 2000  cefTRIAXone (ROCEPHIN) 2 g in sodium chloride 0.9 % 100 mL IVPB  Status:  Discontinued        2 g 200 mL/hr over 30 Minutes Intravenous Every 24 hours 04/08/21 1349 04/10/21 0947   04/08/21 1000  cefTRIAXone (ROCEPHIN) 2 g in sodium chloride 0.9 % 100 mL IVPB  Status:  Discontinued        2  g 200 mL/hr over 30 Minutes Intravenous Every 24 hours 04/07/21 0905 04/07/21 0931   04/07/21 2200  acyclovir (ZOVIRAX) 800 mg in dextrose 5 % 150 mL IVPB  Status:  Discontinued        800 mg 166 mL/hr over 60 Minutes Intravenous Every 8 hours 04/07/21 1451 04/08/21 1341   04/07/21 2000  meropenem (MERREM) 2 g in sodium chloride 0.9 % 100 mL IVPB  Status:  Discontinued        2 g 200 mL/hr over 30 Minutes Intravenous Every 8 hours 04/07/21 1451 04/08/21 1347   04/07/21 1200  acyclovir (ZOVIRAX) 800 mg in dextrose 5 % 150 mL IVPB  Status:  Discontinued        800 mg 166 mL/hr over 60 Minutes Intravenous Every 8 hours 04/07/21 1037 04/07/21 1451   04/07/21 1100  vancomycin (VANCOREADY) IVPB 1000 mg/200 mL  Status:  Discontinued        1,000 mg 200 mL/hr over 60 Minutes Intravenous Every 12 hours 04/07/21 1037 04/08/21 1340   04/07/21 1030  meropenem (MERREM) 2 g in sodium chloride 0.9 % 100 mL IVPB  Status:  Discontinued        2 g 200 mL/hr over 30 Minutes Intravenous Every 8 hours 04/07/21 1010 04/07/21 1451   04/07/21 1000  cefTRIAXone (ROCEPHIN) 2 g in sodium chloride 0.9 % 100 mL IVPB  Status:  Discontinued        2 g 200 mL/hr over 30 Minutes Intravenous Every 12 hours 04/07/21 0931 04/07/21 0955   04/03/21 2200  vancomycin (VANCOREADY) IVPB 1750 mg/350 mL  Status:  Discontinued        1,750 mg 175 mL/hr over 120 Minutes Intravenous Every 24 hours 04/03/21 0850 04/04/21 0951   04/03/21 1000  vancomycin (VANCOREADY) IVPB 1500 mg/300 mL        1,500 mg 150 mL/hr over 120 Minutes Intravenous  Once 04/03/21 0850 04/03/21 1416  04/03/21 0900  ceFEPIme (MAXIPIME) 2 g in sodium chloride 0.9 % 100 mL IVPB  Status:  Discontinued        2 g 200 mL/hr over 30 Minutes Intravenous Every 8 hours 04/03/21 0850 04/07/21 0857   03/31/21 0200  cefTRIAXone (ROCEPHIN) 2 g in sodium chloride 0.9 % 100 mL IVPB  Status:  Discontinued        2 g 200 mL/hr over 30 Minutes Intravenous Every 24 hours  03/30/21 2051 04/03/21 0823   03/30/21 1630  azithromycin (ZITHROMAX) 500 mg in sodium chloride 0.9 % 250 mL IVPB  Status:  Discontinued        500 mg 250 mL/hr over 60 Minutes Intravenous Every 24 hours 03/30/21 1619 04/03/21 0823   03/30/21 1630  ceFEPIme (MAXIPIME) 2 g in sodium chloride 0.9 % 100 mL IVPB        2 g 200 mL/hr over 30 Minutes Intravenous  Once 03/30/21 1625 03/30/21 1724           Objective: Vitals:   04/12/21 0500 04/12/21 0600 04/12/21 0851 04/12/21 0858  BP:   (!) 151/80 (!) 151/80  Pulse:   79 79  Resp: _0 Temp:    98.6 F (37 C)  TempSrc:    Oral  SpO2:    98%  Weight:      Height:        Intake/Output Summary (Last 24 hours) at 04/12/2021 1144 Last data filed at 04/11/2021 1800 Gross per 24 hour  Intake 480 ml  Output --  Net 480 ml   Filed Weights   04/09/21 0500 04/10/21 0500 04/12/21 0437  Weight: 87 kg 88.3 kg 81 kg    Examination:  General exam: alert, following command, but very weak,  Respiratory system: overall diminished, but appear to have improved aeration compared to yesterday, no wheezing, no rhonchi, no rales, slightly tachypneic ,no accessory muscle use Cardiovascular system:  RRR.  Gastrointestinal system: Abdomen is nondistended, soft and nontender.  Normal bowel sounds heard. Central nervous system: Alert and oriented. No focal neurological deficits. Extremities:  no edema Skin: No rashes, lesions or ulcers Psychiatry: Very weak, but appear appropriate currently.     Data Reviewed: I have personally reviewed following labs and imaging studies  CBC: Recent Labs  Lab 04/07/21 1519 04/09/21 0240 04/10/21 0339 04/11/21 0611 04/12/21 0405  WBC 4.2 3.9* 5.6 5.0 5.1  NEUTROABS 1.9  --   --  2.3  --   HGB 8.7* 8.0* 8.2* 7.8* 7.5*  HCT 29.4* 26.9* 27.4* 26.1* 25.5*  MCV 88.3 87.6 87.3 87.9 89.5  PLT 88* 73* 75* 66* 62*    Basic Metabolic Panel: Recent Labs  Lab 04/07/21 1519 04/09/21 0240  04/10/21 0339 04/11/21 0611 04/12/21 0405  NA 131* 136 135 139 137  K 4.5 3.8 4.2 3.7 3.6  CL 96* 99 98 98 96*  CO2 32 34* 34* 38* 38*  GLUCOSE 93 89 161* 101* 92  BUN _1 CREATININE 0.63 0.59 0.61 0.50 0.50  CALCIUM 8.0* 8.0* 7.8* 8.1* 8.0*  MG  --   --  1.9 2.0  --   PHOS  --   --  1.9* 1.6* 3.4    GFR: Estimated Creatinine Clearance: 77.5 mL/min (by C-G formula based on SCr of 0.5 mg/dL).  Liver Function Tests: Recent Labs  Lab 04/07/21 1519 04/09/21 0240 04/10/21 0339  AST 52* 65* 84*  ALT 20 24 28  ALKPHOS 73 96 169*  BILITOT 0.5 0.2* 0.5  PROT 8.2* 8.1 8.3*  ALBUMIN 2.2* 2.0* 2.1*    CBG: No results for input(s): GLUCAP in the last 168 hours.   Recent Results (from the past 240 hour(s))  Culture, blood (Routine X 2) w Reflex to ID Panel     Status: None   Collection Time: 04/02/21  6:26 PM   Specimen: BLOOD  Result Value Ref Range Status   Specimen Description   Final    BLOOD BLOOD LEFT HAND Performed at [place] [address], [place], [place] [address]    Special Requests   Final    BOTTLES DRAWN AEROBIC ONLY Blood Culture adequate volume Performed at [facility] [address] [place], [place] [address]    Culture   Final    NO GROWTH 5 DAYS Performed at [facility], [place] [address] [place], [place] [address]    Report Status 04/07/2021 FINAL  Final  Culture, blood (Routine X 2) w Reflex to ID Panel     Status: None   Collection Time: 04/02/21  7:43 PM   Specimen: BLOOD LEFT WRIST  Result Value Ref Range Status   Specimen Description   Final    BLOOD LEFT WRIST Performed at [facility] [address] [place], [place] [address]    Special Requests   Final    BOTTLES DRAWN AEROBIC ONLY Blood Culture adequate volume Performed at [place] [address] [place], [place] [address]    Culture   Final    NO GROWTH 5 DAYS Performed at  [facility], [place] [address], [place], [place] [address]    Report Status 04/07/2021 FINAL  Final  MRSA Next Gen by PCR, Nasal     Status: None   Collection Time: 04/03/21 11:26 AM   Specimen: Nasal Mucosa; Nasal Swab  Result Value Ref Range Status   MRSA by PCR Next Gen NOT DETECTED NOT DETECTED Final    Comment: (NOTE) The GeneXpert MRSA Assay (FDA approved for NASAL specimens only), is one component of a comprehensive MRSA colonization surveillance program. It is not intended to diagnose MRSA infection nor to guide or monitor treatment for MRSA infections. Test performance is not FDA approved in patients less than 62 years old. Performed at [facility], [place] [address], [place], [place] [address]   Culture, blood (routine x 2)     Status: None   Collection Time: 04/05/21  3:24 PM   Specimen: BLOOD  Result Value Ref Range Status   Specimen Description   Final    BLOOD BLOOD LEFT HAND Performed at [facility] [address] [place], [place] [address]    Special Requests   Final    BOTTLES DRAWN AEROBIC ONLY Blood Culture adequate volume Performed at [place] [address], [place], [place] [address]    Culture   Final    NO GROWTH 5 DAYS Performed at [facility], [place] [address], [place], [place] [address]    Report Status 04/10/2021 FINAL  Final  Culture, blood (routine x 2)     Status: None   Collection Time: 04/05/21  3:24 PM   Specimen: BLOOD  Result Value Ref Range Status   Specimen Description   Final    BLOOD BLOOD LEFT WRIST Performed at [place] [address] [place], [place] [address]  Special Requests   Final    BOTTLES DRAWN AEROBIC ONLY Blood Culture adequate volume Performed at [place] [address] [place], [place] [address]    Culture   Final    NO GROWTH 5 DAYS Performed at [facility], [place] [address],  [place], [place] [address]    Report Status 04/10/2021 FINAL  Final  Expectorated Sputum Assessment w Gram Stain, Rflx to Resp Cult     Status: None   Collection Time: 04/06/21 10:00 AM   Specimen: Expectorated Sputum  Result Value Ref Range Status   Specimen Description EXPECTORATED SPUTUM  Final   Special Requests NONE  Final   Sputum evaluation   Final    THIS SPECIMEN IS ACCEPTABLE FOR SPUTUM CULTURE Performed at [facility], [place] [address] [place], [place] [address]    Report Status 04/06/2021 FINAL  Final  Culture, Respiratory w Gram Stain     Status: None   Collection Time: 04/06/21 10:00 AM  Result Value Ref Range Status   Specimen Description   Final    EXPECTORATED SPUTUM Performed at [facility], [place] [address] [place], [place] [address]    Special Requests   Final    NONE Reflexed from [id number] Performed at [facility], [place] [address], [place], [place] [address]    Gram Stain   Final    FEW SQUAMOUS EPITHELIAL CELLS PRESENT FEW WBC SEEN FEW YEAST MODERATE GRAM POSITIVE RODS MODERATE GRAM POSITIVE COCCI    Culture   Final    ABUNDANT Normal respiratory flora-no Staph aureus or Pseudomonas seen Performed at [facility], [address], [place], [place] [address]    Report Status 04/08/2021 FINAL  Final  Respiratory (~20 pathogens) panel by PCR     Status: None   Collection Time: 04/07/21  9:29 AM   Specimen: Nasopharyngeal Swab; Respiratory  Result Value Ref Range Status   Adenovirus NOT DETECTED NOT DETECTED Final   Coronavirus 229E NOT DETECTED NOT DETECTED Final    Comment: (NOTE) The Coronavirus on the Respiratory Panel, DOES NOT test for the novel  Coronavirus (2019 nCoV)    Coronavirus HKU1 NOT DETECTED NOT DETECTED Final   Coronavirus NL63 NOT DETECTED NOT DETECTED Final   Coronavirus OC43 NOT DETECTED NOT DETECTED Final   Metapneumovirus NOT DETECTED NOT DETECTED Final   Rhinovirus /  Enterovirus NOT DETECTED NOT DETECTED Final   Influenza A NOT DETECTED NOT DETECTED Final   Influenza B NOT DETECTED NOT DETECTED Final   Parainfluenza Virus 1 NOT DETECTED NOT DETECTED Final   Parainfluenza Virus 2 NOT DETECTED NOT DETECTED Final   Parainfluenza Virus 3 NOT DETECTED NOT DETECTED Final   Parainfluenza Virus 4 NOT DETECTED NOT DETECTED Final   Respiratory Syncytial Virus NOT DETECTED NOT DETECTED Final   Bordetella pertussis NOT DETECTED NOT DETECTED Final   Bordetella Parapertussis NOT DETECTED NOT DETECTED Final   Chlamydophila pneumoniae NOT DETECTED NOT DETECTED Final   Mycoplasma pneumoniae NOT DETECTED NOT DETECTED Final    Comment: Performed at [facility], [place]. [address], [place], [place] [address]  CSF culture w Stat Gram Stain     Status: None   Collection Time: 04/07/21  2:28 PM   Specimen: CSF; Cerebrospinal Fluid  Result Value Ref Range Status   Specimen Description   Final    CSF Performed at [facility] [address] [place], [place] [address]  Special Requests   Final    NONE Performed at [facility], [place] [address], [place], [place] [address]    Gram Stain   Final    NO WBC SEEN NO ORGANISMS SEEN CYTOSPIN SMEAR Gram Stain Report Called to,Read Back By and Verified With: R.MICHAELS, RN AT 1850 ON 11.21.22 BY N.THOMPSON Performed at [place] [address] [place], [place] [address]    Culture   Final    NO GROWTH Performed at [facility], [place] [address] [place], [place] [address]    Report Status 04/11/2021 FINAL  Final  Anaerobic culture w Gram Stain     Status: None (Preliminary result)   Collection Time: 04/07/21  2:28 PM   Specimen: PATH Cytology CSF; Cerebrospinal Fluid  Result Value Ref Range Status   Specimen Description   Final    CSF Performed at [place] [address] [place], [place] [address]    Special Requests   Final     NONE Performed at [facility], [place] [address] [place], [place] [address]    Gram Stain PENDING  Incomplete   Culture   Final    NO ANAEROBES ISOLATED; CULTURE IN PROGRESS FOR 5 DAYS   Report Status PENDING  Incomplete  Pneumocystis smear by DFA     Status: None   Collection Time: 04/09/21  2:49 PM   Specimen: Bronchial Alveolar Lavage; Respiratory  Result Value Ref Range Status   Specimen Source-PJSRC BRONCHIAL ALVEOLAR LAVAGE  Final    Comment: RUL   Pneumocystis jiroveci Ag NEGATIVE  Final    Comment: Performed at [facility], [place] [address] [place], [place] [address]  Culture, Respiratory w Gram Stain     Status: None   Collection Time: 04/09/21  2:49 PM   Specimen: Bronchial Alveolar Lavage; Respiratory  Result Value Ref Range Status   Specimen Description   Final    BRONCHIAL ALVEOLAR LAVAGE RUL Performed at [place] [address] [place], [place] [address]    Special Requests   Final    NONE Performed at [facility], [place] [address], [place], [place] [address]    Gram Stain   Final    FEW WBC PRESENT, PREDOMINANTLY MONONUCLEAR NO ORGANISMS SEEN    Culture   Final    NO GROWTH 2 DAYS Performed at [place] [address] [place], [place] [address]    Report Status 04/11/2021 FINAL  Final  Anaerobic culture w Gram Stain     Status: None (Preliminary result)   Collection Time: 04/09/21  2:49 PM   Specimen: Bronchial Alveolar Lavage; Respiratory  Result Value Ref Range Status   Specimen Description   Final    BRONCHIAL ALVEOLAR LAVAGE RUL Performed at [place] [address] [place], [place] [address]    Special Requests   Final    NONE Performed at [facility], [place] [address], [place], [place] [address]    Gram Stain   Final    FEW WBC PRESENT, PREDOMINANTLY MONONUCLEAR NO ORGANISMS SEEN Performed at [facility], [place] [address], [place], [place] [address]    Culture   Final    NO ANAEROBES ISOLATED; CULTURE IN PROGRESS FOR 5 DAYS   Report Status PENDING  Incomplete         Radiology Studies: No results found.  Scheduled Meds:  carvedilol  12.5 mg Oral BID WC   Chlorhexidine Gluconate Cloth  6 each Topical Daily   furosemide  20 mg Oral Daily   lactose free nutrition  237 mL Oral TID WC   mouth rinse  15 mL Mouth Rinse BID   melatonin  3 mg Oral QHS   mirtazapine  7.5 mg Oral QHS   multivitamin with minerals  1 tablet Oral Daily   phosphorus  500 mg Oral Daily   Ensure Max Protein  11 oz Oral BID   sacubitril-valsartan  1 tablet Oral BID   sodium chloride flush  3 mL Intravenous Q12H   spironolactone  12.5 mg Oral Daily   Continuous Infusions:     LOS: 12 days   Time spent: 71mns Greater than 50% of this time was spent in counseling, explanation of diagnosis, planning of further management, and coordination of care.   Voice Recognition /Viviann Sparedictation system was used to create this note, attempts have been made to correct errors. Please contact the author with questions and/or clarifications.   FFlorencia Reasons MD PhD FACP [facility]  Available via Epic secure chat 7am-7pm for nonurgent issues Please page for urgent issues To page the attending provider between 7A-7P or the covering provider during after hours 7P-7A, please log into the web site [email or web address] and access using universal [place] password for that web site. If you do not have the password, please call the hospital operator.    04/12/2021, 11:44 AM

## 2021-04-12 NOTE — Progress Notes (Signed)
Mobility Specialist - Progress Note    04/12/21 1209  Mobility  Activity Transferred to/from [facility]  Level of Assistance Minimal assist, patient does 75% or more  Assistive Device BSC  Distance Ambulated (ft) 5 ft  Mobility Ambulated with assistance in room  Mobility Response Tolerated well  Mobility performed by Mobility specialist  $Mobility charge 1 Mobility   Upon entry pt requested to use BSC and required Min A to sit EOB and stand. Pt used hand held assist and BSC handles to transfer to [facility]. Once done, pt required Min A to stand and walked ~5 ft to return to bed. Pt refused to ambulate in hallway and was left in bed with call bell at side and RN in room.   [place] Specialist Acute Rehabilitation Services Phone: [phone] 04/12/21, 12:12 PM

## 2021-04-12 NOTE — Progress Notes (Signed)
Pt continues to refuse BIPAP QHS.  BIPAP remains at bedside if pt decides to wear.

## 2021-04-13 ENCOUNTER — Encounter (HOSPITAL_COMMUNITY): Payer: Self-pay | Admitting: Student

## 2021-04-13 LAB — CBC
HCT: 27.3 % — ABNORMAL LOW (ref 36.0–46.0)
Hemoglobin: 8 g/dL — ABNORMAL LOW (ref 12.0–15.0)
MCH: 26.1 pg (ref 26.0–34.0)
MCHC: 29.3 g/dL — ABNORMAL LOW (ref 30.0–36.0)
MCV: 88.9 fL (ref 80.0–100.0)
Platelets: 79 10*3/uL — ABNORMAL LOW (ref 150–400)
RBC: 3.07 MIL/uL — ABNORMAL LOW (ref 3.87–5.11)
RDW: 18.7 % — ABNORMAL HIGH (ref 11.5–15.5)
WBC: 5.6 10*3/uL (ref 4.0–10.5)
nRBC: 0 % (ref 0.0–0.2)

## 2021-04-13 LAB — COMPREHENSIVE METABOLIC PANEL
ALT: 26 U/L (ref 0–44)
AST: 60 U/L — ABNORMAL HIGH (ref 15–41)
Albumin: 2.2 g/dL — ABNORMAL LOW (ref 3.5–5.0)
Alkaline Phosphatase: 128 U/L — ABNORMAL HIGH (ref 38–126)
Anion gap: 2 — ABNORMAL LOW (ref 5–15)
BUN: 6 mg/dL — ABNORMAL LOW (ref 8–23)
CO2: 39 mmol/L — ABNORMAL HIGH (ref 22–32)
Calcium: 8.4 mg/dL — ABNORMAL LOW (ref 8.9–10.3)
Chloride: 98 mmol/L (ref 98–111)
Creatinine, Ser: 0.48 mg/dL (ref 0.44–1.00)
GFR, Estimated: >60 mL/min (ref >60)
Glucose, Bld: 128 mg/dL — ABNORMAL HIGH (ref 70–99)
Potassium: 3.6 mmol/L (ref 3.5–5.1)
Sodium: 139 mmol/L (ref 135–145)
Total Bilirubin: 0.4 mg/dL (ref 0.3–1.2)
Total Protein: 8 g/dL (ref 6.5–8.1)

## 2021-04-13 LAB — ANAEROBIC CULTURE W GRAM STAIN: Gram Stain: NONE SEEN

## 2021-04-13 LAB — ACID FAST SMEAR (AFB, MYCOBACTERIA): Acid Fast Smear: NEGATIVE

## 2021-04-13 LAB — PHOSPHORUS: Phosphorus: 3.9 mg/dL (ref 2.5–4.6)

## 2021-04-13 LAB — HAPTOGLOBIN: Haptoglobin: 224 mg/dL (ref 37–355)

## 2021-04-13 LAB — MAGNESIUM: Magnesium: 2 mg/dL (ref 1.7–2.4)

## 2021-04-13 MED ORDER — SODIUM CHLORIDE 0.9 % IV SOLN: INTRAVENOUS | Status: DC | PRN

## 2021-04-13 NOTE — Progress Notes (Signed)
   04/13/21 1300  Mobility  Activity Refused mobility   Pt refused mobility secondary to being tired and weak. Will check back tomorrow.   [place] Specialist Acute Rehab Services Office: [phone]

## 2021-04-13 NOTE — Progress Notes (Signed)
PROGRESS NOTE    Marie Church  EXB:[id number] DOB: Feb 27, 1956 DOA: 03/30/2021 PCP: System, Provider Not In    Chief Complaint  Patient presents with   Pneumonia    Brief Narrative:  Marie Church is a 65 y.o. 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 on active Kadcyla and radiation treatment, HFrEF (EF improved from 20-25% to 50%), T2DM, HTN, anemia/thrombocytopenia who presented to the ED for evaluation of shortness of breath  Subjective:  She appears slowing getting better, not as weak, she denies cough, no overt bleeding,  confusion has resolved  no chest pain, no edema, no fever, she remains on o2 supplement Has poor appetite only ate 20% , but does drink boost    Assessment & Plan:   Principal Problem:   Pneumonitis Active Problems:   Anemia   Mediastinal lymphadenopathy   Malignant neoplasm of upper-outer quadrant of right breast in female, estrogen receptor negative (HCC)   HFrEF (heart failure with reduced ejection fraction) (HCC)   Hyponatremia   Thrombocytopenia ([place])   Acute respiratory failure (HCC)   Malnutrition of moderate degree   Acute metabolic encephalopathy   Community acquired pneumonia   Lung mass   Acute respiratory failure with hypoxia, sepsis secondary to bilateral pneumonia present on admission, failed outpatient treatment for pneumonia. -CT angiogram of the chest on 11/13 negative for PE, but showed diffuse ground glass opacities which is persistent on repeat ct chest on 11/21 -COVID-19 PCR, influenza PCR is negative, urine for strep pneumonia is negative,.blood cultures negative till date.  -Differentials include radiation pneumonitis vs metastatic disease from breast cancer.  -s/p bronchoscopy on 11/23, f/u on results -she was on Vancomycin cefepime and acyclovir then  Rocephin , abx discontinued on 11/24 per ID recommendation -No fever in the last few days --  appreciate PCCM and ID input -need home 02 eval  Acute toxic /metabolic encephalopathy -MRI brain without contrast is negative for acute intracranial abnormality. -Ammonia level is 24, TSH is within normal limits -Abg with mild co2 retention initially, on bipap qhs -Neurology signed off, mri brain no acute findings, eeg unremarkable -Confusion appears has resolved      Hyponatremia Suspect hypovolemic hyponatremia in the setting of dehydration. Resolved  Hypophosphatemia Normalized, monitor as needed    Chronic systolic heart failure/history of nonischemic cardiomyopathy. Echocardiogram on 03/03/2021 showed improvement in left ventricular ejection fraction to 50% compared to the previous echo showing left ventricular ejection fraction at 20 to 25%. -Continue with Coreg at 12.5 mg twice daily, Entresto 24-26 mg twice daily,  Lasix and spironolactone  She states does not take farxiga       Metastatic right-sided breast cancer with axillary node involvement Patient follows up with [facility] oncology, currently on active chemotherapy with Kadcyla with last treatment on 03/19/2021 Patient also undergoing radiation treatment through radiation oncology at [facility]. She has one more week of radiation treatment.  She has not received any radiation this week as the radiation machine has been not working.   Anemia/thrombocytopenia Probably secondary to chemotherapy Continue to monitor counts. Hemoglobin stable around 8 and platelet counts worsening, hematology consulted Plt was 62 on 11/26, 79 on 11/27, hopefully start to improve, HIT panel pending   Anxiety She wants to try one does benadryl now, she wants to continue hydroxyzine at night, she thinks hydroxyzine is too strong for her during the day  Start remeron for appetite, continue nutrition supplement  FTT, start PT eval, increase activity  Nutritional Assessment:  The patient's BMI is: Body mass index is 28.66 kg/m.Marland Kitchen  Seen by  dietician.  I agree with the assessment and plan as outlined below:  Nutrition Status: Nutrition Problem: Moderate Malnutrition Etiology: chronic illness, cancer and cancer related treatments Signs/Symptoms: mild fat depletion, mild muscle depletion Interventions: Boost Plus, MVI, Premier Protein  .     Unresulted Labs (From admission, onward)     Start     Ordered   04/13/21 0500  Comprehensive metabolic panel  Daily,   R     Question:  Specimen collection method  Answer:  IV Team=IV Team collect   04/12/21 1806   04/12/21 0500  Heparin induced platelet Ab (HIT antibody)  Tomorrow morning,   R       Question:  Specimen collection method  Answer:  IV Team=IV Team collect   04/11/21 1058   04/12/21 0500  Haptoglobin  Tomorrow morning,   R       Question:  Specimen collection method  Answer:  IV Team=IV Team collect   04/11/21 1643   04/12/21 0500  CBC  Daily,   R     Question:  Specimen collection method  Answer:  IV Team=IV Team collect   04/11/21 1919   04/09/21 1452  Aspergillus Ag, BAL/Serum  RELEASE UPON ORDERING,   TIMED       Comments: Specimen C: Phone [phone]         Previous Biopsy:  no Is the patient on airborne/droplet precautions? No           Clinical History:  N/A Copy of Report to:  N/A Specimen Disposition: Cytology     04/09/21 1452   04/09/21 1452  Fungus Culture With Stain  RELEASE UPON ORDERING,   TIMED       Comments: Specimen C: Phone [phone]         Previous Biopsy:  no Is the patient on airborne/droplet precautions? No           Clinical History:  N/A Copy of Report to:  N/A Specimen Disposition: Cytology     04/09/21 1452   04/09/21 1449  Acid Fast Culture with reflexed sensitivities  Once,   R        04/09/21 1449   04/07/21 1532  Draw extra clot tube  RELEASE UPON ORDERING,   STAT       Question:  Specimen collection method  Answer:  IV Team=IV Team collect   04/07/21 1532   04/07/21 1532  Fungus Culture With Stain  RELEASE UPON  ORDERING,   STAT        04/07/21 1532   04/07/21 1516  Culture, fungus without smear  Once,   R        04/07/21 1522   04/07/21 1516  Anaerobic culture w Gram Stain  Once,   R        04/07/21 1522   04/07/21 1516  Oligoclonal bands, CSF + serm  Once,   TIMED        04/07/21 1522   04/07/21 0933  Acid Fast Smear (AFB)  (AFB smear + Culture w reflexed sensitivities panel)  Once,   R       See Hyperspace for full Linked Orders Report.   04/07/21 0932   04/07/21 0933  Acid Fast Culture with reflexed sensitivities  (AFB smear + Culture w reflexed sensitivities panel)  Once,   R       See Hyperspace for full Linked  Orders Report.   04/07/21 0932              DVT prophylaxis: Place and maintain sequential compression device Start: 04/10/21 1614   Code Status: Full Family Communication: no Family at bedside Disposition:   Status is: Inpatient  Dispo: The patient is from: home              Anticipated d/c is to: Previous PT eval recommending home with home health, she appears very weak, poor appetite, need home O2 eval, likely will need to go home with home O2              Anticipated d/c date is: When platelet number improves, f/u on HIT panel, need clearance from pulmonology and hematology                Consultants:  Pulmonology ID Neurology hematology  Procedures:  bronchoscopy  Antimicrobials:   Anti-infectives (From admission, onward)    Start     Dose/Rate Route Frequency Ordered Stop   04/08/21 2000  meropenem (MERREM) 1 g in sodium chloride 0.9 % 100 mL IVPB  Status:  Discontinued        1 g 200 mL/hr over 30 Minutes Intravenous Every 8 hours 04/08/21 1347 04/08/21 1349   04/08/21 2000  cefTRIAXone (ROCEPHIN) 2 g in sodium chloride 0.9 % 100 mL IVPB  Status:  Discontinued        2 g 200 mL/hr over 30 Minutes Intravenous Every 24 hours 04/08/21 1349 04/10/21 0947   04/08/21 1000  cefTRIAXone (ROCEPHIN) 2 g in sodium chloride 0.9 % 100 mL IVPB  Status:   Discontinued        2 g 200 mL/hr over 30 Minutes Intravenous Every 24 hours 04/07/21 0905 04/07/21 0931   04/07/21 2200  acyclovir (ZOVIRAX) 800 mg in dextrose 5 % 150 mL IVPB  Status:  Discontinued        800 mg 166 mL/hr over 60 Minutes Intravenous Every 8 hours 04/07/21 1451 04/08/21 1341   04/07/21 2000  meropenem (MERREM) 2 g in sodium chloride 0.9 % 100 mL IVPB  Status:  Discontinued        2 g 200 mL/hr over 30 Minutes Intravenous Every 8 hours 04/07/21 1451 04/08/21 1347   04/07/21 1200  acyclovir (ZOVIRAX) 800 mg in dextrose 5 % 150 mL IVPB  Status:  Discontinued        800 mg 166 mL/hr over 60 Minutes Intravenous Every 8 hours 04/07/21 1037 04/07/21 1451   04/07/21 1100  vancomycin (VANCOREADY) IVPB 1000 mg/200 mL  Status:  Discontinued        1,000 mg 200 mL/hr over 60 Minutes Intravenous Every 12 hours 04/07/21 1037 04/08/21 1340   04/07/21 1030  meropenem (MERREM) 2 g in sodium chloride 0.9 % 100 mL IVPB  Status:  Discontinued        2 g 200 mL/hr over 30 Minutes Intravenous Every 8 hours 04/07/21 1010 04/07/21 1451   04/07/21 1000  cefTRIAXone (ROCEPHIN) 2 g in sodium chloride 0.9 % 100 mL IVPB  Status:  Discontinued        2 g 200 mL/hr over 30 Minutes Intravenous Every 12 hours 04/07/21 0931 04/07/21 0955   04/03/21 2200  vancomycin (VANCOREADY) IVPB 1750 mg/350 mL  Status:  Discontinued        1,750 mg 175 mL/hr over 120 Minutes Intravenous Every 24 hours 04/03/21 0850 04/04/21 0951   04/03/21 1000  vancomycin (VANCOREADY) IVPB  1500 mg/300 mL        1,500 mg 150 mL/hr over 120 Minutes Intravenous  Once 04/03/21 0850 04/03/21 1416   04/03/21 0900  ceFEPIme (MAXIPIME) 2 g in sodium chloride 0.9 % 100 mL IVPB  Status:  Discontinued        2 g 200 mL/hr over 30 Minutes Intravenous Every 8 hours 04/03/21 0850 04/07/21 0857   03/31/21 0200  cefTRIAXone (ROCEPHIN) 2 g in sodium chloride 0.9 % 100 mL IVPB  Status:  Discontinued        2 g 200 mL/hr over 30 Minutes  Intravenous Every 24 hours 03/30/21 2051 04/03/21 0823   03/30/21 1630  azithromycin (ZITHROMAX) 500 mg in sodium chloride 0.9 % 250 mL IVPB  Status:  Discontinued        500 mg 250 mL/hr over 60 Minutes Intravenous Every 24 hours 03/30/21 1619 04/03/21 0823   03/30/21 1630  ceFEPIme (MAXIPIME) 2 g in sodium chloride 0.9 % 100 mL IVPB        2 g 200 mL/hr over 30 Minutes Intravenous  Once 03/30/21 1625 03/30/21 1724           Objective: Vitals:   04/13/21 0500 04/13/21 0606 04/13/21 0804 04/13/21 1215  BP:  (!) 142/83 (!) 151/84 135/78  Pulse:  86 82 77  Resp:   16 20  Temp:   98.9 F (37.2 C) 98.6 F (37 C)  TempSrc:   Oral Oral  SpO2:   100% 99%  Weight: 80.9 kg     Height:        Intake/Output Summary (Last 24 hours) at 04/13/2021 1340 Last data filed at 04/13/2021 0029 Gross per 24 hour  Intake --  Output 1100 ml  Net -1100 ml   Filed Weights   04/10/21 0500 04/12/21 0437 04/13/21 0500  Weight: 88.3 kg 81 kg 80.9 kg    Examination:  General exam: aaox3, but very weak,  Respiratory system: overall diminished, but appear to have improved aeration compared to yesterday, no wheezing, no rhonchi, no rales, slightly tachypneic ,no accessory muscle use Cardiovascular system:  RRR.  Gastrointestinal system: Abdomen is nondistended, soft and nontender.  Normal bowel sounds heard. Central nervous system: Alert and oriented. No focal neurological deficits. Extremities:  no edema Skin: No rashes, lesions or ulcers Psychiatry: Very weak, but appropriate     Data Reviewed: I have personally reviewed following labs and imaging studies  CBC: Recent Labs  Lab 04/07/21 1519 04/09/21 0240 04/10/21 0339 04/11/21 0611 04/12/21 0405 04/13/21 0414  WBC 4.2 3.9* 5.6 5.0 5.1 5.6  NEUTROABS 1.9  --   --  2.3  --   --   HGB 8.7* 8.0* 8.2* 7.8* 7.5* 8.0*  HCT 29.4* 26.9* 27.4* 26.1* 25.5* 27.3*  MCV 88.3 87.6 87.3 87.9 89.5 88.9  PLT 88* 73* 75* 66* 62* 79*    Basic  Metabolic Panel: Recent Labs  Lab 04/09/21 0240 04/10/21 0339 04/11/21 0611 04/12/21 0405 04/13/21 0414  NA 136 135 139 137 139  K 3.8 4.2 3.7 3.6 3.6  CL 99 98 98 96* 98  CO2 34* 34* 38* 38* 39*  GLUCOSE 89 161* 101* 92 128*  BUN _0 6*  CREATININE 0.59 0.61 0.50 0.50 0.48  CALCIUM 8.0* 7.8* 8.1* 8.0* 8.4*  MG  --  1.9 2.0  --  2.0  PHOS  --  1.9* 1.6* 3.4 3.9    GFR: Estimated Creatinine Clearance: 77.4 mL/min (by C-G  formula based on SCr of 0.48 mg/dL).  Liver Function Tests: Recent Labs  Lab 04/07/21 1519 04/09/21 0240 04/10/21 0339 04/13/21 0414  AST 52* 65* 84* 60*  ALT _0 ALKPHOS 73 96 169* 128*  BILITOT 0.5 0.2* 0.5 0.4  PROT 8.2* 8.1 8.3* 8.0  ALBUMIN 2.2* 2.0* 2.1* 2.2*    CBG: No results for input(s): GLUCAP in the last 168 hours.   Recent Results (from the past 240 hour(s))  Culture, blood (routine x 2)     Status: None   Collection Time: 04/05/21  3:24 PM   Specimen: BLOOD  Result Value Ref Range Status   Specimen Description   Final    BLOOD BLOOD LEFT HAND Performed at [facility] [address] [place], [place] [address]    Special Requests   Final    BOTTLES DRAWN AEROBIC ONLY Blood Culture adequate volume Performed at [facility] [address] [place], [place] [address]    Culture   Final    NO GROWTH 5 DAYS Performed at [facility], [name] [address], [place], [place] [address]    Report Status 04/10/2021 FINAL  Final  Culture, blood (routine x 2)     Status: None   Collection Time: 04/05/21  3:24 PM   Specimen: BLOOD  Result Value Ref Range Status   Specimen Description   Final    BLOOD BLOOD LEFT WRIST Performed at [facility] [address] [place], [place] [address]    Special Requests   Final    BOTTLES DRAWN AEROBIC ONLY Blood Culture adequate volume Performed at [facility] [address], [place], [place]  [address]    Culture   Final    NO GROWTH 5 DAYS Performed at [facility], [place] [address], [place], [place] [address]    Report Status 04/10/2021 FINAL  Final  Expectorated Sputum Assessment w Gram Stain, Rflx to Resp Cult     Status: None   Collection Time: 04/06/21 10:00 AM   Specimen: Expectorated Sputum  Result Value Ref Range Status   Specimen Description EXPECTORATED SPUTUM  Final   Special Requests NONE  Final   Sputum evaluation   Final    THIS SPECIMEN IS ACCEPTABLE FOR SPUTUM CULTURE Performed at [facility], [place] [address], [place], [place] [address]    Report Status 04/06/2021 FINAL  Final  Culture, Respiratory w Gram Stain     Status: None   Collection Time: 04/06/21 10:00 AM  Result Value Ref Range Status   Specimen Description   Final    EXPECTORATED SPUTUM Performed at [facility], [place] [address], [place], [place] [address]    Special Requests   Final    NONE Reflexed from [id number] Performed at [facility], [place] [address], [place], [place] [address]    Gram Stain   Final    FEW SQUAMOUS EPITHELIAL CELLS PRESENT FEW WBC SEEN FEW YEAST MODERATE GRAM POSITIVE RODS MODERATE GRAM POSITIVE COCCI    Culture   Final    ABUNDANT Normal respiratory flora-no Staph aureus or Pseudomonas seen Performed at [facility], [address], [place], [place] [address]    Report Status 04/08/2021 FINAL  Final  Respiratory (~20 pathogens) panel by PCR     Status: None   Collection Time: 04/07/21  9:29 AM   Specimen: Nasopharyngeal Swab; Respiratory  Result Value  Ref Range Status   Adenovirus NOT DETECTED NOT DETECTED Final   Coronavirus 229E NOT DETECTED NOT DETECTED Final    Comment: (NOTE) The Coronavirus on the Respiratory Panel, DOES NOT test for the novel  Coronavirus (2019 nCoV)    Coronavirus HKU1 NOT DETECTED NOT DETECTED Final   Coronavirus NL63 NOT DETECTED NOT DETECTED Final   Coronavirus OC43  NOT DETECTED NOT DETECTED Final   Metapneumovirus NOT DETECTED NOT DETECTED Final   Rhinovirus / Enterovirus NOT DETECTED NOT DETECTED Final   Influenza A NOT DETECTED NOT DETECTED Final   Influenza B NOT DETECTED NOT DETECTED Final   Parainfluenza Virus 1 NOT DETECTED NOT DETECTED Final   Parainfluenza Virus 2 NOT DETECTED NOT DETECTED Final   Parainfluenza Virus 3 NOT DETECTED NOT DETECTED Final   Parainfluenza Virus 4 NOT DETECTED NOT DETECTED Final   Respiratory Syncytial Virus NOT DETECTED NOT DETECTED Final   Bordetella pertussis NOT DETECTED NOT DETECTED Final   Bordetella Parapertussis NOT DETECTED NOT DETECTED Final   Chlamydophila pneumoniae NOT DETECTED NOT DETECTED Final   Mycoplasma pneumoniae NOT DETECTED NOT DETECTED Final    Comment: Performed at [facility], [place] [address] [place], [place] [address]  CSF culture w Stat Gram Stain     Status: None   Collection Time: 04/07/21  2:28 PM   Specimen: CSF; Cerebrospinal Fluid  Result Value Ref Range Status   Specimen Description   Final    CSF Performed at [facility] [address], [place], [place] [address]    Special Requests   Final    NONE Performed at [facility], [place] [address], [place], [place] [address]    Gram Stain   Final    NO WBC SEEN NO ORGANISMS SEEN CYTOSPIN SMEAR Gram Stain Report Called to,Read Back By and Verified With: R.MICHAELS, RN AT 1850 ON 11.21.22 BY N.THOMPSON Performed at [facility] [address] [place], [place] [address]    Culture   Final    NO GROWTH Performed at [facility], [place] [address], [place], [place] [address]    Report Status 04/11/2021 FINAL  Final  Anaerobic culture w Gram Stain     Status: None (Preliminary result)   Collection Time: 04/07/21  2:28 PM   Specimen: PATH Cytology CSF; Cerebrospinal Fluid  Result Value Ref Range Status   Specimen Description   Final    CSF Performed at [facility] [address] [place], [place] [address]    Special Requests   Final    NONE Performed at [facility], [place] [address] [place], [place] [address]    Gram Stain PENDING  Incomplete   Culture   Final    NO ANAEROBES ISOLATED; CULTURE IN PROGRESS FOR 5 DAYS   Report Status PENDING  Incomplete  Pneumocystis smear by DFA     Status: None   Collection Time: 04/09/21  2:49 PM   Specimen: Bronchial Alveolar Lavage; Respiratory  Result Value Ref Range Status   Specimen Source-PJSRC BRONCHIAL ALVEOLAR LAVAGE  Final    Comment: RUL   Pneumocystis jiroveci Ag NEGATIVE  Final    Comment: Performed at [facility], [place] [address], [place], [place] [address]  Culture, Respiratory w Gram Stain     Status: None   Collection Time: 04/09/21  2:49 PM   Specimen: Bronchial Alveolar Lavage; Respiratory  Result Value Ref Range Status  Specimen Description   Final    BRONCHIAL ALVEOLAR LAVAGE RUL Performed at [facility] [address] [place], [place] [address]    Special Requests   Final    NONE Performed at [facility], [place] [address], [place], [place] [address]    Gram Stain   Final    FEW WBC PRESENT, PREDOMINANTLY MONONUCLEAR NO ORGANISMS SEEN    Culture   Final    NO GROWTH 2 DAYS Performed at [facility] [address] [place], [place] [address]    Report Status 04/11/2021 FINAL  Final  Anaerobic culture w Gram Stain     Status: None (Preliminary result)   Collection Time: 04/09/21  2:49 PM   Specimen: Bronchial Alveolar Lavage; Respiratory  Result Value Ref Range Status   Specimen Description   Final    BRONCHIAL ALVEOLAR LAVAGE RUL Performed at [facility] [address], [place], [place] [address]    Special Requests   Final    NONE Performed at [facility], [place] [address] [place], [place] [address]    Gram Stain   Final     FEW WBC PRESENT, PREDOMINANTLY MONONUCLEAR NO ORGANISMS SEEN Performed at [facility], [place] [address], [place], [place] [address]    Culture   Final    NO ANAEROBES ISOLATED; CULTURE IN PROGRESS FOR 5 DAYS   Report Status PENDING  Incomplete         Radiology Studies: No results found.      Scheduled Meds:  carvedilol  12.5 mg Oral BID WC   Chlorhexidine Gluconate Cloth  6 each Topical Daily   furosemide  20 mg Oral Daily   lactose free nutrition  237 mL Oral TID WC   mouth rinse  15 mL Mouth Rinse BID   melatonin  3 mg Oral QHS   mirtazapine  7.5 mg Oral QHS   multivitamin with minerals  1 tablet Oral Daily   phosphorus  500 mg Oral Daily   Ensure Max Protein  11 oz Oral BID   sacubitril-valsartan  1 tablet Oral BID   sodium chloride flush  3 mL Intravenous Q12H   spironolactone  12.5 mg Oral Daily   Continuous Infusions:     LOS: 13 days   Time spent: 50mns Greater than 50% of this time was spent in counseling, explanation of diagnosis, planning of further management, and coordination of care.   Voice Recognition /Viviann Sparedictation system was used to create this note, attempts have been made to correct errors. Please contact the author with questions and/or clarifications.   FFlorencia Reasons MD PhD FACP [facility]  Available via Epic secure chat 7am-7pm for nonurgent issues Please page for urgent issues To page the attending provider between 7A-7P or the covering provider during after hours 7P-7A, please log into the web site [email or web address] and access using universal [place] password for that web site. If you do not have the password, please call the hospital operator.    04/13/2021, 1:40 PM

## 2021-04-13 NOTE — Progress Notes (Signed)
Pt continues to want to hold off on BIPAP QHS.

## 2021-04-13 NOTE — Progress Notes (Signed)
SATURATION QUALIFICATIONS: (This note is used to comply with regulatory documentation for home oxygen)  Patient Saturations on Room Air at Rest = 96 %  Patient Saturations on Room Air while Ambulating = 88 %  Patient Saturations on 2 Liters of oxygen while Ambulating = 99 %  Please briefly explain why patient needs home oxygen: The patient ambulated to the restroom on room air. The complained of shortness of breath with activity. Oxygen saturation on room air with activity decreased to 88%. 2 lt applied while standing in room and saturation increased to 95-99 %.

## 2021-04-14 ENCOUNTER — Ambulatory Visit
Admission: RE | Admit: 2021-04-14 | Discharge: 2021-04-14 | Disposition: A | Discharge status: Home / Self Care | Payer: Self-pay | Source: Ambulatory Visit | Attending: Radiation Oncology | Admitting: Radiation Oncology

## 2021-04-14 ENCOUNTER — Ambulatory Visit: Payer: Self-pay

## 2021-04-14 LAB — COMPREHENSIVE METABOLIC PANEL
ALT: 23 U/L (ref 0–44)
AST: 51 U/L — ABNORMAL HIGH (ref 15–41)
Albumin: 2.1 g/dL — ABNORMAL LOW (ref 3.5–5.0)
Alkaline Phosphatase: 111 U/L (ref 38–126)
Anion gap: 4 — ABNORMAL LOW (ref 5–15)
BUN: 6 mg/dL — ABNORMAL LOW (ref 8–23)
CO2: 37 mmol/L — ABNORMAL HIGH (ref 22–32)
Calcium: 8.4 mg/dL — ABNORMAL LOW (ref 8.9–10.3)
Chloride: 98 mmol/L (ref 98–111)
Creatinine, Ser: 0.57 mg/dL (ref 0.44–1.00)
GFR, Estimated: >60 mL/min (ref >60)
Glucose, Bld: 121 mg/dL — ABNORMAL HIGH (ref 70–99)
Potassium: 3.7 mmol/L (ref 3.5–5.1)
Sodium: 139 mmol/L (ref 135–145)
Total Bilirubin: 0.3 mg/dL (ref 0.3–1.2)
Total Protein: 7.7 g/dL (ref 6.5–8.1)

## 2021-04-14 LAB — CBC
HCT: 25.3 % — ABNORMAL LOW (ref 36.0–46.0)
Hemoglobin: 7.6 g/dL — ABNORMAL LOW (ref 12.0–15.0)
MCH: 26.7 pg (ref 26.0–34.0)
MCHC: 30 g/dL (ref 30.0–36.0)
MCV: 88.8 fL (ref 80.0–100.0)
Platelets: 86 10*3/uL — ABNORMAL LOW (ref 150–400)
RBC: 2.85 MIL/uL — ABNORMAL LOW (ref 3.87–5.11)
RDW: 18.9 % — ABNORMAL HIGH (ref 11.5–15.5)
WBC: 4.5 10*3/uL (ref 4.0–10.5)
nRBC: 0 % (ref 0.0–0.2)

## 2021-04-14 LAB — ANAEROBIC CULTURE W GRAM STAIN

## 2021-04-14 LAB — HEPARIN INDUCED PLATELET AB (HIT ANTIBODY): Heparin Induced Plt Ab: 0.229 OD (ref 0.000–0.400)

## 2021-04-14 LAB — ASPERGILLUS ANTIGEN, BAL/SERUM: Aspergillus Ag, BAL/Serum: 0.06 Index (ref 0.00–0.49)

## 2021-04-14 LAB — OLIGOCLONAL BANDS, CSF + SERM

## 2021-04-14 MED ORDER — PROSOURCE PLUS PO LIQD
30.0000 mL | Freq: Two times a day (BID) | ORAL | Status: DC
  Administered 2021-04-14 – 2021-04-15 (×3): 30 mL via ORAL
  Filled 2021-04-14 (×3): qty 30

## 2021-04-14 NOTE — Progress Notes (Signed)
PROGRESS NOTE    Marie Church  DVV:[id number] DOB: 04/17/1956 DOA: 03/30/2021 PCP: System, Provider Not In   Chief Complain:SOB  Brief Narrative: Patient is a 65 y.o. 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  She was initially suspected to have bilateral pneumonia and was started on IV antibiotics.  Hospital course also remarkable for confusion, MRI brain negative for any acute intracranial normalities.  Also found to have anemia/thrombocytopenia, hematology following.  Hospital course remarkable for persistent weakness, poor oral intake.  Dietitian, PT/OT following.Plan for DC tomorrow to home with Home Health   Assessment & Plan:   Principal Problem:   Pneumonitis Active Problems:   Anemia   Mediastinal lymphadenopathy   Malignant neoplasm of upper-outer quadrant of right breast in female, estrogen receptor negative (HCC)   HFrEF (heart failure with reduced ejection fraction) (HCC)   Hyponatremia   Thrombocytopenia (HCC)   Acute respiratory failure (HCC)   Malnutrition of moderate degree   Acute metabolic encephalopathy   Community acquired pneumonia   Lung mass   Acute respiratory failure with hypoxia, sepsis secondary to bilateral pneumonia present on admission, failed outpatient treatment for pneumonia. -CT angiogram of the chest on 11/13 negative for PE, but showed diffuse ground glass opacities which is persistent on repeat ct chest on 11/21 -COVID-19 PCR, influenza PCR is negative, urine for strep pneumonia is negative,.blood cultures negative till date.  -Differentials include radiation pneumonitis vs metastatic disease from breast cancer.  -s/p bronchoscopy on 11/23, f/u on results -she was on Vancomycin cefepime and  acyclovir then  Rocephin , abx discontinued on 11/24 per ID recommendation -No fever in the last few day -She qualified for home oxygen, she will be on 2 L per minute of oxygen , she still complains of some cough   Acute toxic /metabolic encephalopathy -MRI brain without contrast is negative for acute intracranial abnormality. -Ammonia level is 24, TSH is within normal limits -Abg with mild co2 retention initially, on bipap qhs -Neurology signed off, mri brain no acute findings, eeg unremarkable -Confusion appears has resolved     Chronic systolic heart failure/history of nonischemic cardiomyopathy. Echocardiogram on 03/03/2021 showed improvement in left ventricular ejection fraction to 50% compared to the previous echo showing left ventricular ejection fraction at 20 to 25%. -Continue with Coreg at 12.5 mg twice daily, Entresto 24-26 mg twice daily,  Lasix and spironolactone  She states does not take farxiga   Metastatic right-sided breast cancer with axillary node involvement Patient follows up with [facility] oncology, currently on active chemotherapy with Kadcyla with last treatment on 03/19/2021 Patient also undergoing radiation treatment through radiation oncology . She has one more week of radiation treatment.     Anemia/thrombocytopenia Probably secondary to chemotherapy Continue to monitor counts. Hemoglobin stable around 8 and platelet counts worsening, hematology consulted Vitamin G26, folic acid level normal.  Mildly  elevated LDH.  HIT panel pending Hematology following    Anxiety She wants to try one does benadryl now, she wants to continue hydroxyzine at night, she thinks hydroxyzine is too strong for her during the day   Poor oral intake: Start remeron for appetite, continue nutrition supplement  Weakness/debility/deconditioning: PT/OT consulted,recommended HH  Nutrition Problem: Moderate Malnutrition Etiology: chronic illness, cancer and cancer related treatments       DVT prophylaxis:SCD  Code Status: Full Family Communication:: Discussed with daughter on phone Patient status:  Dispo: The patient is from: Home              Anticipated d/c is to: Tomorrow              Consultants: Hematology  Procedures:None  Antimicrobials:  Anti-infectives (From admission, onward)    Start     Dose/Rate Route Frequency Ordered Stop   04/08/21 2000  meropenem (MERREM) 1 g in sodium chloride 0.9 % 100 mL IVPB  Status:  Discontinued        1 g 200 mL/hr over 30 Minutes Intravenous Every 8 hours 04/08/21 1347 04/08/21 1349   04/08/21 2000  cefTRIAXone (ROCEPHIN) 2 g in sodium chloride 0.9 % 100 mL IVPB  Status:  Discontinued        2 g 200 mL/hr over 30 Minutes Intravenous Every 24 hours 04/08/21 1349 04/10/21 0947   04/08/21 1000  cefTRIAXone (ROCEPHIN) 2 g in sodium chloride 0.9 % 100 mL IVPB  Status:  Discontinued        2 g 200 mL/hr over 30 Minutes Intravenous Every 24 hours 04/07/21 0905 04/07/21 0931   04/07/21 2200  acyclovir (ZOVIRAX) 800 mg in dextrose 5 % 150 mL IVPB  Status:  Discontinued        800 mg 166 mL/hr over 60 Minutes Intravenous Every 8 hours 04/07/21 1451 04/08/21 1341   04/07/21 2000  meropenem (MERREM) 2 g in sodium chloride 0.9 % 100 mL IVPB  Status:  Discontinued        2 g 200 mL/hr over 30 Minutes Intravenous Every 8 hours 04/07/21 1451 04/08/21 1347   04/07/21 1200  acyclovir (ZOVIRAX) 800 mg in dextrose 5 % 150 mL IVPB  Status:  Discontinued        800 mg 166 mL/hr over 60 Minutes Intravenous Every 8 hours 04/07/21 1037 04/07/21 1451   04/07/21 1100  vancomycin (VANCOREADY) IVPB 1000 mg/200 mL  Status:  Discontinued        1,000 mg 200 mL/hr over 60 Minutes Intravenous Every 12 hours 04/07/21 1037 04/08/21 1340   04/07/21 1030  meropenem (MERREM) 2 g in sodium chloride 0.9 % 100 mL IVPB  Status:  Discontinued        2 g 200 mL/hr over 30 Minutes Intravenous Every 8 hours 04/07/21 1010 04/07/21 1451   04/07/21 1000   cefTRIAXone (ROCEPHIN) 2 g in sodium chloride 0.9 % 100 mL IVPB  Status:  Discontinued        2 g 200 mL/hr over 30 Minutes Intravenous Every 12 hours 04/07/21 0931 04/07/21 0955   04/03/21 2200  vancomycin (VANCOREADY) IVPB 1750 mg/350 mL  Status:  Discontinued        1,750 mg 175 mL/hr over 120 Minutes Intravenous Every 24 hours 04/03/21 0850 04/04/21 0951   04/03/21 1000  vancomycin (VANCOREADY) IVPB 1500 mg/300 mL        1,500 mg 150 mL/hr over 120 Minutes Intravenous  Once 04/03/21 0850 04/03/21 1416   04/03/21 0900  ceFEPIme (MAXIPIME) 2 g in sodium chloride 0.9 % 100 mL IVPB  Status:  Discontinued        2 g 200 mL/hr over 30 Minutes Intravenous Every 8 hours 04/03/21 0850 04/07/21 0857   03/31/21 0200  cefTRIAXone (ROCEPHIN) 2 g in sodium chloride 0.9 % 100 mL IVPB  Status:  Discontinued        2 g 200 mL/hr over 30  Minutes Intravenous Every 24 hours 03/30/21 2051 04/03/21 0823   03/30/21 1630  azithromycin (ZITHROMAX) 500 mg in sodium chloride 0.9 % 250 mL IVPB  Status:  Discontinued        500 mg 250 mL/hr over 60 Minutes Intravenous Every 24 hours 03/30/21 1619 04/03/21 0823   03/30/21 1630  ceFEPIme (MAXIPIME) 2 g in sodium chloride 0.9 % 100 mL IVPB        2 g 200 mL/hr over 30 Minutes Intravenous  Once 03/30/21 1625 03/30/21 1724       Subjective:  Patient seen and examined at the bedside this afternoon.  Hemodynamically stable on 2 L of oxygen per minute.  Denies any worsening shortness of breath or cough.  Still having some cough.  Complains of loss of appetite  Objective: Vitals:   04/13/21 1935 04/14/21 0025 04/14/21 0451 04/14/21 0523  BP: 138/63 135/76  (!) 144/72  Pulse: 81 90  75  Resp: 18 20  16   Temp: 98.6 F (37 C)   98 F (36.7 C)  TempSrc: Oral   Oral  SpO2: 97% 99%  98%  Weight:   78 kg   Height:        Intake/Output Summary (Last 24 hours) at 04/14/2021 1150 Last data filed at 04/13/2021 2100 Gross per 24 hour  Intake 238.24 ml  Output --   Net 238.24 ml   Filed Weights   04/12/21 0437 04/13/21 0500 04/14/21 0451  Weight: 81 kg 80.9 kg 78 kg    Examination:  General exam: Overall comfortable, not in distress, chronically ill looking, weak HEENT: PERRL, enlarged lymph node on the right neck Respiratory system:  no wheezes or crackles  Cardiovascular system: S1 & S2 heard, RRR.  Gastrointestinal system: Abdomen is nondistended, soft and nontender. Central nervous system: Alert and oriented Extremities: No edema, no clubbing ,no cyanosis Skin: No rashes, no ulcers,no icterus      Data Reviewed: I have personally reviewed following labs and imaging studies  CBC: Recent Labs  Lab 04/07/21 1519 04/09/21 0240 04/10/21 0339 04/11/21 0611 04/12/21 0405 04/13/21 0414 04/14/21 0237  WBC 4.2   < > 5.6 5.0 5.1 5.6 4.5  NEUTROABS 1.9  --   --  2.3  --   --   --   HGB 8.7*   < > 8.2* 7.8* 7.5* 8.0* 7.6*  HCT 29.4*   < > 27.4* 26.1* 25.5* 27.3* 25.3*  MCV 88.3   < > 87.3 87.9 89.5 88.9 88.8  PLT 88*   < > 75* 66* 62* 79* 86*   < > = values in this interval not displayed.   Basic Metabolic Panel: Recent Labs  Lab 04/10/21 0339 04/11/21 0611 04/12/21 0405 04/13/21 0414 04/14/21 0237  NA 135 139 137 139 139  K 4.2 3.7 3.6 3.6 3.7  CL 98 98 96* 98 98  CO2 34* 38* 38* 39* 37*  GLUCOSE 161* 101* 92 128* 121*  BUN 10 11 9  6* 6*  CREATININE 0.61 0.50 0.50 0.48 0.57  CALCIUM 7.8* 8.1* 8.0* 8.4* 8.4*  MG 1.9 2.0  --  2.0  --   PHOS 1.9* 1.6* 3.4 3.9  --    GFR: Estimated Creatinine Clearance: 76.1 mL/min (by C-G formula based on SCr of 0.57 mg/dL). Liver Function Tests: Recent Labs  Lab 04/07/21 1519 04/09/21 0240 04/10/21 0339 04/13/21 0414 04/14/21 0237  AST 52* 65* 84* 60* 51*  ALT 20 24 28 26 23   ALKPHOS 73 96  169* 128* 111  BILITOT 0.5 0.2* 0.5 0.4 0.3  PROT 8.2* 8.1 8.3* 8.0 7.7  ALBUMIN 2.2* 2.0* 2.1* 2.2* 2.1*   No results for input(s): LIPASE, AMYLASE in the last 168 hours. No results for  input(s): AMMONIA in the last 168 hours. Coagulation Profile: No results for input(s): INR, PROTIME in the last 168 hours. Cardiac Enzymes: No results for input(s): CKTOTAL, CKMB, CKMBINDEX, TROPONINI in the last 168 hours. BNP (last 3 results) No results for input(s): PROBNP in the last 8760 hours. HbA1C: No results for input(s): HGBA1C in the last 72 hours. CBG: No results for input(s): GLUCAP in the last 168 hours. Lipid Profile: No results for input(s): CHOL, HDL, LDLCALC, TRIG, CHOLHDL, LDLDIRECT in the last 72 hours. Thyroid Function Tests: No results for input(s): TSH, T4TOTAL, FREET4, T3FREE, THYROIDAB in the last 72 hours. Anemia Panel: Recent Labs    04/11/21 1705 04/12/21 0405  VITAMINB12  --  451  FOLATE 7.9  --   FERRITIN  --  213  TIBC  --  199*  IRON  --  53  RETICCTPCT  --  2.5   Sepsis Labs: No results for input(s): PROCALCITON, LATICACIDVEN in the last 168 hours.  Recent Results (from the past 240 hour(s))  Culture, blood (routine x 2)     Status: None   Collection Time: 04/05/21  3:24 PM   Specimen: BLOOD  Result Value Ref Range Status   Specimen Description   Final    BLOOD BLOOD LEFT HAND Performed at [facility] [address] [place], [place] [address]    Special Requests   Final    BOTTLES DRAWN AEROBIC ONLY Blood Culture adequate volume Performed at [facility] [address], [place], [place] [address]    Culture   Final    NO GROWTH 5 DAYS Performed at [facility], [place] [address] [place], [place] [address]    Report Status 04/10/2021 FINAL  Final  Culture, blood (routine x 2)     Status: None   Collection Time: 04/05/21  3:24 PM   Specimen: BLOOD  Result Value Ref Range Status   Specimen Description   Final    BLOOD BLOOD LEFT WRIST Performed at [facility] [address], [place], [place] [address]    Special Requests   Final    BOTTLES DRAWN AEROBIC ONLY Blood  Culture adequate volume Performed at [facility] [address], [place], [place] [address]    Culture   Final    NO GROWTH 5 DAYS Performed at [facility], [place] [address] [place], [place] [address]    Report Status 04/10/2021 FINAL  Final  Expectorated Sputum Assessment w Gram Stain, Rflx to Resp Cult     Status: None   Collection Time: 04/06/21 10:00 AM   Specimen: Expectorated Sputum  Result Value Ref Range Status   Specimen Description EXPECTORATED SPUTUM  Final   Special Requests NONE  Final   Sputum evaluation   Final    THIS SPECIMEN IS ACCEPTABLE FOR SPUTUM CULTURE Performed at [facility], [place] [address] [place], [place] [address]    Report Status 04/06/2021 FINAL  Final  Culture, Respiratory w Gram Stain     Status: None   Collection Time: 04/06/21 10:00 AM  Result Value Ref Range Status   Specimen Description   Final    EXPECTORATED SPUTUM Performed at [facility], [address] [place], [place] [address]    Special Requests   Final    NONE Reflexed from [id number] Performed at [facility], [place] [address] [place], [place] [address]    Gram Stain   Final    FEW SQUAMOUS EPITHELIAL CELLS PRESENT FEW WBC SEEN FEW YEAST MODERATE GRAM POSITIVE RODS MODERATE GRAM POSITIVE COCCI    Culture   Final    ABUNDANT Normal respiratory flora-no Staph aureus or Pseudomonas seen Performed at [facility], [address], [place], [place] [address]    Report Status 04/08/2021 FINAL  Final  Respiratory (~20 pathogens) panel by PCR     Status: None   Collection Time: 04/07/21  9:29 AM   Specimen: Nasopharyngeal Swab; Respiratory  Result Value Ref Range Status   Adenovirus NOT DETECTED NOT DETECTED Final   Coronavirus 229E NOT DETECTED NOT DETECTED Final    Comment: (NOTE) The Coronavirus on the Respiratory Panel, DOES NOT test for the novel  Coronavirus (2019 nCoV)    Coronavirus  HKU1 NOT DETECTED NOT DETECTED Final   Coronavirus NL63 NOT DETECTED NOT DETECTED Final   Coronavirus OC43 NOT DETECTED NOT DETECTED Final   Metapneumovirus NOT DETECTED NOT DETECTED Final   Rhinovirus / Enterovirus NOT DETECTED NOT DETECTED Final   Influenza A NOT DETECTED NOT DETECTED Final   Influenza B NOT DETECTED NOT DETECTED Final   Parainfluenza Virus 1 NOT DETECTED NOT DETECTED Final   Parainfluenza Virus 2 NOT DETECTED NOT DETECTED Final   Parainfluenza Virus 3 NOT DETECTED NOT DETECTED Final   Parainfluenza Virus 4 NOT DETECTED NOT DETECTED Final   Respiratory Syncytial Virus NOT DETECTED NOT DETECTED Final   Bordetella pertussis NOT DETECTED NOT DETECTED Final   Bordetella Parapertussis NOT DETECTED NOT DETECTED Final   Chlamydophila pneumoniae NOT DETECTED NOT DETECTED Final   Mycoplasma pneumoniae NOT DETECTED NOT DETECTED Final    Comment: Performed at [facility], [place] [address] [place], [place] [address]  Acid Fast Smear (AFB)     Status: None   Collection Time: 04/07/21 10:28 AM   Specimen: Sputum  Result Value Ref Range Status   AFB Specimen Processing Concentration  Final   Acid Fast Smear Negative  Final    Comment: (NOTE) Performed At: [facility] [place], [place] [id number] Rush Farmer MD TD:[id number]    Source (AFB) EXPECTORATED SPUTUM  Final    Comment: Performed at [facility], [place] [address] [place], [place] [address]  CSF culture w Stat Gram Stain     Status: None   Collection Time: 04/07/21  2:28 PM   Specimen: CSF; Cerebrospinal Fluid  Result Value Ref Range Status   Specimen Description   Final    CSF Performed at [facility] [address], [place], [place] [address]    Special Requests   Final    NONE Performed at [facility], [place] [address], [place], [place] [address]    Gram Stain   Final    NO WBC SEEN NO ORGANISMS SEEN CYTOSPIN SMEAR Gram  Stain Report Called to,Read Back By and Verified With: R.MICHAELS, RN AT 1850 ON 11.21.22 BY N.THOMPSON Performed at [facility] [address], [place], [place] [address]    Culture   Final    NO GROWTH Performed at [facility], [place] [address] [place], [place] [address]    Report Status 04/11/2021 FINAL  Final  Anaerobic culture w Gram Stain     Status: None   Collection Time: 04/07/21  2:28 PM   Specimen: PATH Cytology CSF; Cerebrospinal Fluid  Result Value Ref Range Status   Specimen Description   Final    CSF Performed at [facility] [address] [place], [place] [address]    Special Requests   Final    NONE Performed at [facility], [place] [address] [place], [place] [address]    Gram Stain NO WBC SEEN NO ORGANISMS SEEN CYTOSPIN SMEAR   Final   Culture   Final    NO ANAEROBES ISOLATED Performed at [facility], [place] [address] [place], [place] [address]    Report Status 04/13/2021 FINAL  Final  Pneumocystis smear by DFA     Status: None   Collection Time: 04/09/21  2:49 PM   Specimen: Bronchial Alveolar Lavage; Respiratory  Result Value Ref Range Status   Specimen Source-PJSRC BRONCHIAL ALVEOLAR LAVAGE  Final    Comment: RUL   Pneumocystis jiroveci Ag NEGATIVE  Final    Comment: Performed at [facility], [place] [address], [place], [place] [address]  Culture, Respiratory w Gram Stain     Status: None   Collection Time: 04/09/21  2:49 PM   Specimen: Bronchial Alveolar Lavage; Respiratory  Result Value Ref Range Status   Specimen Description   Final    BRONCHIAL ALVEOLAR LAVAGE RUL Performed at [facility] [address], [place], [place] [address]    Special Requests   Final    NONE Performed at [facility], [place] [address] [place], [place] [address]    Gram Stain   Final    FEW WBC PRESENT, PREDOMINANTLY MONONUCLEAR NO ORGANISMS  SEEN    Culture   Final    NO GROWTH 2 DAYS Performed at [facility] [address], [place], [place] [address]    Report Status 04/11/2021 FINAL  Final  Anaerobic culture w Gram Stain     Status: None   Collection Time: 04/09/21  2:49 PM   Specimen: Bronchial Alveolar Lavage; Respiratory  Result Value Ref Range Status   Specimen Description   Final    BRONCHIAL ALVEOLAR LAVAGE RUL Performed at [name] [address], [place], [place] [address]    Special Requests   Final    NONE Performed at [facility], [place] [address], [place], [place] [address]    Gram Stain   Final    FEW WBC PRESENT, PREDOMINANTLY MONONUCLEAR NO ORGANISMS SEEN    Culture   Final    NO ANAEROBES ISOLATED Performed at [facility], [place] [address] [place], [place] [address]    Report Status 04/14/2021 FINAL  Final         Radiology Studies: No results found.      Scheduled Meds:  (feeding supplement) PROSource Plus  30 mL Oral BID BM   carvedilol  12.5 mg Oral BID WC   Chlorhexidine Gluconate Cloth  6 each Topical Daily   furosemide  20 mg Oral Daily   mouth rinse  15 mL Mouth Rinse BID   melatonin  3 mg Oral QHS   mirtazapine  7.5 mg Oral QHS   multivitamin with minerals  1 tablet Oral Daily   Ensure Max Protein  11 oz Oral BID   sacubitril-valsartan  1 tablet Oral BID   sodium chloride flush  3 mL Intravenous Q12H   spironolactone  12.5 mg Oral Daily   Continuous Infusions:   LOS: 14 days    Time spent: 35 mins.More than 50% of that time was spent in counseling and/or coordination of care.      Shelly Coss, MD [facility] P11/28/2022, 11:50 AM

## 2021-04-14 NOTE — Progress Notes (Signed)
Physical Therapy Treatment Patient Details Name: Marie Church MRN: [id number] DOB: 07-10-1955 Today's Date: 04/14/2021   History of Present Illness [name] is a 65 year old female with past medical history significant for malignant right breast cancer with axillary node involvement on active Kadcyla and radiation, chronic systolic congestive heart failure (EF 50%), type 2 diabetes mellitus, essential hypertension, anemia/thrombocytopenia who presented to [place] ED on 11/13 for shortness of breath.CT angiogram chest with no evidence of central pulmonary embolism,diffuse bilateral groundglass opacities consistent with pulmonary edema versus infectious process.seen in ED on 11/11 following her radiation treatment for evaluation of cough, fever and weakness, treated for pneumonia and sent home    PT Comments    Patient agreeable to mobilize with therapy today and ambulated ~180' with RW for support. No LOB noted and min guard for safety only. Pt's HR reached max of 124 bpm and SpO2 ranged from 91-97% on 2L/min during gait. EOS pt instructed on functional sit<>stands and seated LE strengthening. Acute PT will continue to follow and progress pt as able.    Recommendations for follow up therapy are one component of a multi-disciplinary discharge planning process, led by the attending physician.  Recommendations may be updated based on patient status, additional functional criteria and insurance authorization.  Follow Up Recommendations  Home health PT     Assistance Recommended at Discharge Intermittent Supervision/Assistance  Equipment Recommendations  Rolling walker (2 wheels)    Recommendations for Other Services       Precautions / Restrictions Precautions Precautions: Fall Precaution Comments: monitor O2 and HR, has LE cellulitis, very weak legs Restrictions Weight Bearing Restrictions: No     Mobility  Bed Mobility               General bed mobility comments: pt  OOB in recliner    Transfers Overall transfer level: Needs assistance Equipment used: Rolling walker (2 wheels);None Transfers: Sit to/from Stand Sit to Stand: Min guard           General transfer comment: guarding for safety and cues for hand placement with power up from recliner and safe reach back.    Ambulation/Gait Ambulation/Gait assistance: Min assist;Min guard Gait Distance (Feet): 180 Feet Assistive device: Rolling walker (2 wheels) Gait Pattern/deviations: Step-through pattern;Decreased stride length;Trunk flexed Gait velocity: decr     General Gait Details: trunk slightly flexed with cues needed for posture throughout. pt maintained safe distance to walker and no overt LOB noted. knees flexed in stance phase as pt fatigued.   Stairs             Wheelchair Mobility    Modified Rankin (Stroke Patients Only)       Balance Overall balance assessment: Needs assistance Sitting-balance support: Feet supported Sitting balance-Leahy Scale: Good     Standing balance support: During functional activity;Bilateral upper extremity supported;Reliant on assistive device for balance Standing balance-Leahy Scale: Poor                              Cognition Arousal/Alertness: Awake/alert Behavior During Therapy: WFL for tasks assessed/performed Overall Cognitive Status: Within Functional Limits for tasks assessed                                          Exercises Other Exercises Other Exercises: 2x5 reps sit<>stand from recliner. Other Exercises: 10 reps bil  LE LAQ Other Exercises: 20 reps ankle pumps with LE's elevated    General Comments        Pertinent Vitals/Pain Pain Assessment: Faces Faces Pain Scale: Hurts little more Pain Location: generalized pain in LE's Pain Descriptors / Indicators: Discomfort (also sinus headache) Pain Intervention(s): Limited activity within patient's tolerance;Monitored during  session;Repositioned;Patient requesting pain meds-RN notified    Home Living                          Prior Function            PT Goals (current goals can now be found in the care plan section) Acute Rehab PT Goals Patient Stated Goal: go home PT Goal Formulation: With patient/family Time For Goal Achievement: 04/15/21 Potential to Achieve Goals: Good Progress towards PT goals: Progressing toward goals    Frequency    Min 3X/week      PT Plan Current plan remains appropriate    Co-evaluation              AM-PAC PT "6 Clicks" Mobility   Outcome Measure  Help needed turning from your back to your side while in a flat bed without using bedrails?: A Little Help needed moving from lying on your back to sitting on the side of a flat bed without using bedrails?: A Little Help needed moving to and from a bed to a chair (including a wheelchair)?: A Little Help needed standing up from a chair using your arms (e.g., wheelchair or bedside chair)?: A Little Help needed to walk in hospital room?: A Little Help needed climbing 3-5 steps with a railing? : A Lot 6 Click Score: 17    End of Session Equipment Utilized During Treatment: Gait belt;Oxygen Activity Tolerance: Patient tolerated treatment well Patient left: in chair;with call bell/phone within reach;with family/visitor present;with nursing/sitter in room Nurse Communication: Mobility status PT Visit Diagnosis: Unsteadiness on feet (R26.81);Pain;Difficulty in walking, not elsewhere classified (R26.2)     Time: 4540-9811 PT Time Calculation (min) (ACUTE ONLY): 30 min  Charges:  $Gait Training: 8-22 mins $Therapeutic Exercise: 8-22 mins                     Verner Mould, DPT Acute Rehabilitation Services Office [phone] Pager [phone]    Jacques Navy 04/14/2021, 11:23 AM

## 2021-04-14 NOTE — Plan of Care (Signed)
  Problem: Health Behavior/Discharge Planning: Goal: Ability to manage health-related needs will improve Outcome: Progressing   Problem: Clinical Measurements: Goal: Ability to maintain clinical measurements within normal limits will improve Outcome: Progressing Goal: Will remain free from infection Outcome: Progressing   

## 2021-04-14 NOTE — Progress Notes (Signed)
Pt continues to refuse BiPAP qhs. 

## 2021-04-14 NOTE — Progress Notes (Addendum)
O2 ambulation task not completed. Duplicate order see Dealer (RN) notes.

## 2021-04-14 NOTE — Progress Notes (Signed)
Nutrition Follow-up  DOCUMENTATION CODES:   Non-severe (moderate) malnutrition in context of chronic illness  INTERVENTION:  - will d/c Boost Plus. - continue Ensure Max BID, each supplement provides 150 kcal and 30 grams protein. - will order 30 ml Prosource Plus BID, each supplement provides 100 kcal and 15 grams protein.    NUTRITION DIAGNOSIS:   Moderate Malnutrition related to chronic illness, cancer and cancer related treatments as evidenced by mild fat depletion, mild muscle depletion. -ongoing  GOAL:   Patient will meet greater than or equal to 90% of their needs -unmet  MONITOR:   PO intake, Supplement acceptance, Labs, Weight trends   ASSESSMENT:   65 yo female with a PMH of malignant right breast cancer with axillary node involvement on active Kadcyla and radiation, chronic systolic CHF (EF 37%), T0WI, essential HTN, anemia/thrombocytopenia who is admitted with sepsis due to PNA.  Patient sitting up in the chair with daughter at bedside and breakfast tray in front of her. She had eaten ~25% of toast and 50% of yogurt. She has snacks brought from home on her bedside table.  Recently documented meal intakes were 0% of dinner on 11/24; 0% of breakfast and 20% of lunch on 11/25; 75% of breakfast and 50% of lunch on 11/27.  She has been accepting Boost Plus 50% of the time offered and Ensure Max 75% of the time offered.   Patient reports that appetite and intakes are slowly improving and she has no discomfort with eating or drinking, just that appetite is somewhat slow to return.  She shares that Boost Plus specifically has been causing reflux when she drinks it. She confirms sitting up right while drinking and after drinking.  We discussed replacing it with Prosource Plus and she is open to trying this change.  Weight trending back down toward admit weight; diuresis ongoing.     Labs reviewed; BUN: 6 mg/dl, Ca: 8.4 mg/dl.  Medications reviewed; 20 mg oral  lasix/day, 3 mg melatonin/day, 1 tablet multivitamin with minerals/day, 500 mg KPhos neutral x1 dose/day 11/24-11/27, 12.5 mg aldactone/day.    Diet Order:   Diet Order             Diet Heart Room service appropriate? Yes; Fluid consistency: Thin  Diet effective now                   EDUCATION NEEDS:   Education needs have been addressed  Skin:  Skin Assessment: Reviewed RN Assessment  Last BM:  11/27 (type 5 x1, medium amount)  Height:   Ht Readings from Last 1 Encounters:  03/30/21 5' 6.14" (1.68 m)    Weight:   Wt Readings from Last 1 Encounters:  04/14/21 78 kg     Estimated Nutritional Needs:  Kcal:  2100-2300 Protein:  95-110 grams Fluid:  >2.1 L      Jarome Matin, MS, RD, LDN, CNSC Inpatient Clinical Dietitian RD pager # available in [facility]  After hours/weekend pager # available in [facility]

## 2021-04-15 ENCOUNTER — Other Ambulatory Visit: Payer: Self-pay

## 2021-04-15 ENCOUNTER — Ambulatory Visit: Payer: Self-pay

## 2021-04-15 ENCOUNTER — Ambulatory Visit
Admission: RE | Admit: 2021-04-15 | Discharge: 2021-04-15 | Disposition: A | Discharge status: Home / Self Care | Payer: Self-pay | Source: Ambulatory Visit | Attending: Radiation Oncology | Admitting: Radiation Oncology

## 2021-04-15 LAB — CBC WITH DIFFERENTIAL/PLATELET
Abs Immature Granulocytes: 0.01 10*3/uL (ref 0.00–0.07)
Basophils Absolute: 0 10*3/uL (ref 0.0–0.1)
Basophils Relative: 0 %
Eosinophils Absolute: 0.2 10*3/uL (ref 0.0–0.5)
Eosinophils Relative: 5 %
HCT: 27.1 % — ABNORMAL LOW (ref 36.0–46.0)
Hemoglobin: 8.1 g/dL — ABNORMAL LOW (ref 12.0–15.0)
Immature Granulocytes: 0 %
Lymphocytes Relative: 53 %
Lymphs Abs: 1.8 10*3/uL (ref 0.7–4.0)
MCH: 26.6 pg (ref 26.0–34.0)
MCHC: 29.9 g/dL — ABNORMAL LOW (ref 30.0–36.0)
MCV: 88.9 fL (ref 80.0–100.0)
Monocytes Absolute: 0.7 10*3/uL (ref 0.1–1.0)
Monocytes Relative: 21 %
Neutro Abs: 0.7 10*3/uL — ABNORMAL LOW (ref 1.7–7.7)
Neutrophils Relative %: 21 %
Platelets: 92 10*3/uL — ABNORMAL LOW (ref 150–400)
RBC: 3.05 MIL/uL — ABNORMAL LOW (ref 3.87–5.11)
RDW: 19.3 % — ABNORMAL HIGH (ref 11.5–15.5)
WBC: 3.4 10*3/uL — ABNORMAL LOW (ref 4.0–10.5)
nRBC: 0 % (ref 0.0–0.2)

## 2021-04-15 LAB — CYTOLOGY - NON PAP

## 2021-04-15 MED ORDER — PREDNISONE 20 MG PO TABS
40.0000 mg | ORAL_TABLET | Freq: Every day | ORAL | 0 refills | Status: AC
Start: 2021-04-16 — End: 2021-04-23
  Filled 2021-04-15 – 2021-04-17 (×2): qty 12, 6d supply, fill #0

## 2021-04-15 MED ORDER — CARVEDILOL 12.5 MG PO TABS
12.5000 mg | ORAL_TABLET | Freq: Two times a day (BID) | ORAL | 0 refills | Status: DC
  Filled 2021-04-15: qty 60, 30d supply, fill #0

## 2021-04-15 MED ORDER — MIRTAZAPINE 7.5 MG PO TABS
7.5000 mg | ORAL_TABLET | Freq: Every day | ORAL | 0 refills | Status: DC
  Filled 2021-04-15: qty 30, 30d supply, fill #0

## 2021-04-15 MED ORDER — PREDNISONE 20 MG PO TABS
40.0000 mg | ORAL_TABLET | Freq: Every day | ORAL | Status: DC
  Administered 2021-04-15: 40 mg via ORAL

## 2021-04-15 MED ORDER — PREDNISONE 20 MG PO TABS
ORAL_TABLET | ORAL | Status: AC
  Filled 2021-04-15: qty 2

## 2021-04-15 MED ORDER — LORAZEPAM 0.5 MG PO TABS
0.5000 mg | ORAL_TABLET | Freq: Four times a day (QID) | ORAL | 0 refills | Status: DC | PRN
Start: 2021-04-15 — End: 2021-04-15
  Filled 2021-04-15: qty 15, 4d supply, fill #0

## 2021-04-15 MED ORDER — POTASSIUM CHLORIDE CRYS ER 20 MEQ PO TBCR: 20.0000 meq | EXTENDED_RELEASE_TABLET | Freq: Every day | ORAL | 3 refills | Status: DC

## 2021-04-15 MED ORDER — PREDNISONE 20 MG PO TABS: 20.0000 mg | ORAL_TABLET | Freq: Every day | ORAL | Status: DC

## 2021-04-15 MED ORDER — HEPARIN SOD (PORK) LOCK FLUSH 100 UNIT/ML IV SOLN
500.0000 [IU] | INTRAVENOUS | Status: AC | PRN
  Administered 2021-04-15: 500 [IU]
  Filled 2021-04-15: qty 5

## 2021-04-15 MED ORDER — PREDNISONE 20 MG PO TABS
20.0000 mg | ORAL_TABLET | Freq: Every day | ORAL | 0 refills | Status: AC
  Filled 2021-04-15: qty 7, 7d supply, fill #0

## 2021-04-15 MED ORDER — GUAIFENESIN-DM 100-10 MG/5ML PO SYRP
5.0000 mL | ORAL_SOLUTION | ORAL | 1 refills | Status: DC | PRN
Start: 2021-04-15 — End: 2022-07-30
  Filled 2021-04-15: qty 118, 4d supply, fill #0

## 2021-04-15 NOTE — Progress Notes (Signed)
SATURATION QUALIFICATIONS: (This note is used to comply with regulatory documentation for home oxygen)  Patient Saturations on Room Air at Rest = 90%  Patient Saturations on Room Air while Ambulating = 84%  Patient Saturations on 2 Liters of oxygen while Ambulating = 97%  Please briefly explain why patient needs home oxygen: Pt desat to 84-88% on RA with activity and improved to 92-97% on 2L/min to ambulate in room, hallway, and complete self care tasks. She will benefit from supplemental O2 to maintain adequate O2 saturations for mobility.  Verner Mould, DPT Acute Rehabilitation Services Office [phone] Pager [phone]

## 2021-04-15 NOTE — Progress Notes (Signed)
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 on active Kadcyla and radiation, chronic systolic congestive heart failure (EF 50%), type 2 diabetes mellitus, essential hypertension, anemia/thrombocytopenia who presented to [place] ED on 11/13 for shortness of breath.CT angiogram chest with no evidence of central pulmonary embolism,diffuse bilateral groundglass opacities consistent with pulmonary edema versus infectious process.seen in ED on 11/11 following her radiation treatment for evaluation of cough, fever and weakness, treated for pneumonia and sent home    PT Comments    Patient agreeable to mobilize after encouragement from therapist. Pt completed self care/hygiene to wash body with min assist from therapist to reach back and LE's. Pt desat to 84-88% on RA with activity and improved to 92-97% on 2L/min to ambulate in room, hallway, and complete self care tasks. Pt required 4 standing breaks to ambulate ~140' and educated on benefits of Rollator for energy conservation(seated rest breaks) during long distance mobility. DME recommendations updated to improve mobility for safe discharge home. Acute PT will continue to progress pt as able.   Recommendations for follow up therapy are one component of a multi-disciplinary discharge planning process, led by the attending physician.  Recommendations may be updated based on patient status, additional functional criteria and insurance authorization.  Follow Up Recommendations  Home health PT     Assistance Recommended at Discharge Intermittent Supervision/Assistance  Equipment Recommendations  Rollator (4 wheels) (updated to rollator due to SOB//fatigue and need for therapeutic rest break/energy  conservation with long distance)    Recommendations for Other Services       Precautions / Restrictions Precautions Precautions: Fall Precaution Comments: monitor O2 and HR, has LE cellulitis, very weak legs Restrictions Weight Bearing Restrictions: No     Mobility  Bed Mobility Overal bed mobility: Needs Assistance Bed Mobility: Supine to Sit     Supine to sit: Min guard;Min assist;HOB elevated     General bed mobility comments: pt taking extra time and effort to rise, pt leaning posteriorly after initially sitting upright and min assist rpovded to stabilize balance at EOB.    Transfers Overall transfer level: Needs assistance Equipment used: Rolling walker (2 wheels);None Transfers: Sit to/from Stand Sit to Stand: Min guard           General transfer comment: guarding for safety and cues for hand placement with power up from recliner and safe reach back.    Ambulation/Gait Ambulation/Gait assistance: Min assist;Min guard Gait Distance (Feet): 140 Feet Assistive device: None (O2 tank carrier) Gait Pattern/deviations: Step-through pattern;Decreased stride length;Shuffle;Trendelenburg Gait velocity: decr     General Gait Details: pt with low foot clearance overall and trendelenburg gait noted with no device to support/stabilize balance. pt able to pull O2 tank with Rt UE but close guard/assist required to steady. Pt taking 4 standign rest breaks to catch breath.   Stairs             Wheelchair Mobility    Modified Rankin (Stroke Patients Only)       Balance Overall balance assessment: Needs assistance Sitting-balance support: Feet supported Sitting balance-Leahy Scale: Good Sitting balance - Comments: pt abe to complete bathing seated EOB, assist for LE's and to reach back   Standing balance support: During functional activity;Bilateral upper extremity supported;Reliant on assistive device for balance Standing balance-Leahy Scale: Poor  Cognition Arousal/Alertness: Awake/alert Behavior During Therapy: WFL for tasks assessed/performed Overall Cognitive Status: Within Functional Limits for tasks assessed                                          Exercises      General Comments        Pertinent Vitals/Pain Pain Assessment: No/denies pain    Home Living                          Prior Function            PT Goals (current goals can now be found in the care plan section) Acute Rehab PT Goals Patient Stated Goal: go home PT Goal Formulation: With patient/family Time For Goal Achievement: 04/15/21 Potential to Achieve Goals: Good Progress towards PT goals: Progressing toward goals    Frequency    Min 3X/week      PT Plan Current plan remains appropriate    Co-evaluation              AM-PAC PT "6 Clicks" Mobility   Outcome Measure  Help needed turning from your back to your side while in a flat bed without using bedrails?: A Little Help needed moving from lying on your back to sitting on the side of a flat bed without using bedrails?: A Little Help needed moving to and from a bed to a chair (including a wheelchair)?: A Little Help needed standing up from a chair using your arms (e.g., wheelchair or bedside chair)?: A Little Help needed to walk in hospital room?: A Little Help needed climbing 3-5 steps with a railing? : A Lot 6 Click Score: 17    End of Session Equipment Utilized During Treatment: Gait belt;Oxygen Activity Tolerance: Patient tolerated treatment well Patient left: in chair;with call bell/phone within reach;with family/visitor present;with nursing/sitter in room Nurse Communication: Mobility status PT Visit Diagnosis: Unsteadiness on feet (R26.81);Pain;Difficulty in walking, not elsewhere classified (R26.2)     Time: 9735-3299 PT Time Calculation (min) (ACUTE ONLY): 29 min  Charges:  $Gait Training: 8-22  mins $Therapeutic Activity: 8-22 mins                     Verner Mould, DPT Acute Rehabilitation Services Office [phone] Pager [phone]     Jacques Navy 04/15/2021, 11:47 AM

## 2021-04-15 NOTE — Progress Notes (Signed)
04/15/2021   I have seen and evaluated the patient for respiratory failure.  S:  No events, breathing improved but not back to baseline. Denies cough, chest pain. Weak, not eating much.  O: Blood pressure 125/73, pulse 80, temperature 98.2 F (36.8 C), temperature source Oral, resp. rate 20, height 5' 6.14" (1.68 m), weight 78 kg, SpO2 98 %.  Chronically ill appearing woman in NAD Lungs clear, no wheezing Ext warm Moves all 4 ext to command  A:  Undifferentiated pneumonitis vs. Pulmonary edema in breast cancer patient on chemoradiation.  Bronch specimens neg for infectious or malignant etiologies.  P:  Trial of steroids x 2 weeks as ordered DC qHS BIPAP Will arrange f/u with Dr. Verlee Monte in clinic with repeat CXR in ~2 weeks, PCCM available PRN   Erskine Emery MD [facility] epic messenger for cross cover needs If after hours, please call E-link

## 2021-04-15 NOTE — TOC Progression Note (Signed)
Transition of Care [facility]) - Progression Note    Patient Details  Name: Marie Church MRN: [id number] Date of Birth: 1955/11/15  Transition of Care [facility] CM/SW Contact  Purcell Mouton, RN Phone Number: 04/15/2021, 12:50 PM  Clinical Narrative:    [facility] will follow pt at home for HHPT and [employer] for DME and home O2.    Expected Discharge Plan: [place] Barriers to Discharge: Continued Medical Work up  Expected Discharge Plan and Services Expected Discharge Plan: [place] Choice: [place] arrangements for the past 2 months: Single Family Home Expected Discharge Date: 04/15/21                                     Social Determinants of Health (SDOH) Interventions    Readmission Risk Interventions Readmission Risk Prevention Plan 04/02/2021 10/10/2020 10/07/2020  Transportation Screening Complete Complete Complete  PCP or Specialist Appt within 5-7 Days - Complete Complete  Home Care Screening - Complete Complete  Medication Review (RN CM) - Complete Complete  Medication Review ([place]) Complete - -  PCP or Specialist appointment within 3-5 days of discharge Complete - -  [place] or Home Care Consult Complete - -  SW Recovery Care/Counseling Consult Complete - -  Palliative Care Screening Not Applicable - -  [place] Not Applicable - -

## 2021-04-15 NOTE — Discharge Summary (Addendum)
Physician Discharge Summary  Marie Church VHQ:[id number] DOB: [birth year] DOA: 03/30/2021  PCP: System, Provider Not In  Admit date: 03/30/2021 Discharge date: 04/15/2021  Admitted From: Home Disposition:  Home  Discharge Condition:Stable CODE STATUS:FULL Diet recommendation: Regular  Brief/Interim Summary:  Patient is a 65 y.o. 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  She was initially suspected to have bilateral pneumonia and was started on IV antibiotics.  Hospital course also remarkable for confusion, MRI brain negative for any acute intracranial normalities.  Also found to have anemia/thrombocytopenia, hematology was following.  Hospital course remarkable for persistent weakness, poor oral intake.  Dietitian, PT/OT following.mental status has improved and she is currently alert and oriented.  Respiratory status is stable on 2 L of oxygen per minute.  She qualified for home oxygen.  Platelets level have improved, HIT panel negative.  She is medically stable for discharge to home today with home health.  She will follow-up with pulmonology as an outpatient.  Following problems were addressed during her hospitalization:  Acute respiratory failure with hypoxia -She was being treated with  outpatient treatment for pneumonia. -CT angiogram of the chest on 11/13 negative for PE, but showed diffuse ground glass opacities which is persistent on repeat ct chest on 11/21 -COVID-19 PCR, influenza PCR is negative, urine for strep pneumonia is negative,.blood cultures negative till date.  -Differentials include radiation pneumonitis vs metastatic disease from breast cancer.  -s/p bronchoscopy on 11/23, cytology hasnt resulted yet -she was on  Vancomycin cefepime and acyclovir then  Rocephin , abx discontinued on 11/24 per ID recommendation -No fever in the last few days -She qualified for home oxygen, she will be on 2 L per minute of oxygen , she still complains of some cough -She will continue prednisone on discharge on tapering dose as per PCCM.  She will follow-up with pulmonology as an outpatient   Acute toxic /metabolic encephalopathy -MRI brain without contrast is negative for acute intracranial abnormality. -Ammonia level was 24, TSH is within normal limits -Abg with mild co2 retention initially, was on bipap qhs -Neurology signed off, mri brain no acute findings, eeg unremarkable -Confusion  resolved     Chronic systolic heart failure/history of nonischemic cardiomyopathy. Echocardiogram on 03/03/2021 showed improvement in left ventricular ejection fraction to 50% compared to the previous echo showing left ventricular ejection fraction at 20 to 25%. -Continue with Coreg at 12.5 mg twice daily, Entresto 24-26 mg twice daily,  Lasix and spironolactone  She states does not take farxiga   Metastatic right-sided breast cancer with axillary node involvement Patient follows up with [facility] oncology, currently on active chemotherapy with Kadcyla with last treatment on 03/19/2021 Patient also undergoing radiation treatment through radiation oncology . She has one more week of radiation treatment.  We recommend to follow-up with oncology as an outpatient.   Anemia/thrombocytopenia Probably secondary to chemotherapy Continue to monitor counts as an outpatient Hemoglobin stable around 8 and platelet counts were worsening, hematology consulted Vitamin U13, folic acid level normal.  Mildly  elevated LDH.  HIT panel negative Bedrest level stable now.   Anxiety Takes lorazepam at home  Poor oral intake: Started remeron for appetite, continue nutrition supplement   Weakness/debility/deconditioning: PT/OT consulted,recommended  HH      Discharge Diagnoses:  Principal Problem:   Pneumonitis Active  Problems:   Anemia   Mediastinal lymphadenopathy   Malignant neoplasm of upper-outer quadrant of right breast in female, estrogen receptor negative (HCC)   HFrEF (heart failure with reduced ejection fraction) (HCC)   Hyponatremia   Thrombocytopenia (HCC)   Acute respiratory failure (HCC)   Malnutrition of moderate degree   Acute metabolic encephalopathy   Community acquired pneumonia   Lung mass    Discharge Instructions  Discharge Instructions     Diet general   Complete by: As directed    Discharge instructions   Complete by: As directed    1)Please take prescribed medications as instructed 2)Follow up with your oncologist as an outpatient 3)You will be called by pulmonology for outpatient follow-up 4)Follow up with home health   Increase activity slowly   Complete by: As directed       Allergies as of 04/15/2021       Reactions   Doxycycline Rash   Amoxicillin-pot Clavulanate Rash   Tolerates Rocephin   Lisinopril Rash        Medication List     STOP taking these medications    aspirin 81 MG chewable tablet   Farxiga 10 MG Tabs tablet Generic drug: dapagliflozin propanediol       TAKE these medications    carvedilol 12.5 MG tablet Commonly known as: COREG Take 1 tablet (12.5 mg total) by mouth 2 (two) times daily with a meal. What changed:  medication strength how much to take   Dexlansoprazole 30 MG capsule DR Take 30 mg by mouth daily as needed (for acid reflux).   Entresto 24-26 MG Generic drug: sacubitril-valsartan Take 1 tablet by mouth 2 (two) times daily.   furosemide 40 MG tablet Commonly known as: LASIX Take 0.5 tablets (20 mg total) by mouth daily.   gabapentin 300 MG capsule Commonly known as: NEURONTIN Take 300 mg by mouth 2 (two) times daily.   guaiFENesin-dextromethorphan 100-10 MG/5ML syrup Commonly known as: ROBITUSSIN DM Take 5 mLs by  mouth every 4 (four) hours as needed for cough.   LORazepam 0.5 MG tablet Commonly known as: ATIVAN Take 1 tablet (0.5 mg total) by mouth every 6 (six) hours as needed for anxiety.   mirtazapine 7.5 MG tablet Commonly known as: REMERON Take 1 tablet (7.5 mg total) by mouth at bedtime.   potassium chloride SA 20 MEQ tablet Commonly known as: KLOR-CON M Take 1 tablet (20 mEq total) by mouth daily. What changed: how much to take   predniSONE 20 MG tablet Commonly known as: DELTASONE Take 2 tablets (40 mg total) by mouth daily with breakfast for 6 days. Start taking on: April 16, 2021   predniSONE 20 MG tablet Commonly known as: DELTASONE Take 1 tablet (20 mg total) by mouth daily with breakfast for 7 days. Start taking on: April 22, 2021   prochlorperazine 10 MG tablet Commonly known as: COMPAZINE Take 10 mg by mouth every 6 (six) hours as needed for nausea or vomiting.   spironolactone 25 MG tablet Commonly known as: Aldactone Take 0.5 tablets (12.5 mg total) by mouth daily.               Durable Medical Equipment  (From admission, onward)           Start     Ordered   04/15/21 1213  For home use only DME 4 wheeled rolling walker with seat  Once       Question:  Patient needs a walker to treat with  the following condition  Answer:  Fear for personal safety   04/15/21 1212   04/14/21 1246  For home use only DME oxygen  Once       Question Answer Comment  Length of Need Lifetime   Mode or (Route) Nasal cannula   Liters per Minute 2   Frequency Continuous (stationary and portable oxygen unit needed)   Oxygen delivery system Gas      04/14/21 1246            Follow-up Information     Croitoru, Mihai, MD. Schedule an appointment as soon as possible for a visit in 1 week(s).   Specialty: Cardiology Contact information: [address] [place] [address] [place], [place] Follow up.   Specialty:  Home Health Services Why: Home Health Physical will follow you at home from this Agency. If you don't receive a call within 24-48 hours call [facility]. Contact information: [address] [place] [address] [phone]         [facility] Follow up.   Why: This is [facility] and Gilford Rile are from this company if you have any problems. Call [facility]. Contact information: [address] [place] [address] [phone]                Allergies  Allergen Reactions   Doxycycline Rash   Amoxicillin-Pot Clavulanate Rash    Tolerates Rocephin   Lisinopril Rash    Consultations: PCCM.  Oncology   Procedures/Studies: CT ABDOMEN PELVIS WO CONTRAST  Result Date: 04/06/2021 CLINICAL DATA:  Abdominal pain and fever EXAM: CT ABDOMEN AND PELVIS WITHOUT CONTRAST TECHNIQUE: Multidetector CT imaging of the abdomen and pelvis was performed following the standard protocol without IV contrast. COMPARISON:  03/28/2021 FINDINGS: LOWER CHEST: Severe bilateral multifocal airspace disease has worsened HEPATOBILIARY: Normal hepatic contours. No intra- or extrahepatic biliary dilatation. The gallbladder is normal. PANCREAS: Normal pancreas. No ductal dilatation or peripancreatic fluid collection. SPLEEN: Normal. ADRENALS/URINARY TRACT: The adrenal glands are normal. No hydronephrosis, nephroureterolithiasis or solid renal mass. The urinary bladder is normal for degree of distention STOMACH/BOWEL: There is no hiatal hernia. Normal duodenal course and caliber. No small bowel dilatation or inflammation. No focal colonic abnormality. Normal appendix. VASCULAR/LYMPHATIC: Normal course and caliber of the major abdominal vessels. No abdominal or pelvic lymphadenopathy. REPRODUCTIVE: Normal uterus. No adnexal mass. MUSCULOSKELETAL. No bony spinal canal stenosis or focal osseous abnormality. OTHER: Post treatment changes of the right breast. IMPRESSION: 1. No acute abnormality  of the abdomen or pelvis. 2. Severe bilateral multifocal pulmonary disease, worsened. This may indicate infection. Electronically Signed   By: Ulyses Jarred M.D.   On: 04/06/2021 22:40   DG Chest 2 View  Result Date: 04/05/2021 CLINICAL DATA:  Fever EXAM: CHEST - 2 VIEW COMPARISON:  04/03/2019 FINDINGS: Lung volumes are small, however, pulmonary insufflation appears stable since prior examination. Superimposed diffuse interstitial pulmonary infiltrate, atypical infection versus edema, appears stable since prior examination. No pneumothorax or pleural effusion. Cardiac size within normal limits. Left internal jugular chest port tip noted within the superior vena cava. No acute bone abnormality. IMPRESSION: Pulmonary hypoinflation, unchanged. Stable diffuse interstitial pulmonary infiltrate, atypical infection versus edema. Electronically Signed   By: Fidela Salisbury M.D.   On: 04/05/2021 20:46   DG Chest 2 View  Result Date: 04/02/2021 CLINICAL DATA:  65 year old lady with prior history of malignant right breast cancer with axillary node  involvement on active Kadcyla and radiation treatment, chronic systolic heart failure with improvement of left ventricular ejection fraction from 25% to 50%, type 2 diabetes mellitus, hypertension, anemia, thrombocytopenia presents to ED on 03/30/2021 for shortness of breath. CT angiogram of the chest shows diffuse bilateral groundglass opacities consistent with pulmonary edema versus infectious process. Patient was probably started on broad-spectrum IV antibiotics and referred to [facility] for admission for acute respiratory failure with hypoxia secondary to pneumonia in the setting of failed outpatient antibiotic treatment. EXAM: CHEST - 2 VIEW COMPARISON:  03/30/2021 and older exams.  CT also dated 03/30/2021. FINDINGS: Heart normal in size.  No convincing mediastinal or hilar masses. Lungs show bilateral interstitial and patchy airspace opacities, which appear increased from  the prior exam, most evident in the right mid and lower lung. No pleural effusion and no pneumothorax. Left anterior chest wall Port-A-Cath is stable. Minor wedge-shaped compression deformity of T4 stable from the prior chest CT. No acute skeletal abnormality. IMPRESSION: 1. Interval worsening of lung aeration. Interstitial and patchy airspace opacities have increased most evident in the right mid to lower lung. 2. No other change.  No pleural effusion or pneumothorax. Electronically Signed   By: Lajean Manes M.D.   On: 04/02/2021 16:04   DG Chest 2 View  Result Date: 03/30/2021 CLINICAL DATA:  Assess for sepsis EXAM: CHEST - 2 VIEW COMPARISON:  March 28, 2021 FINDINGS: The mediastinal contour and cardiac silhouette are stable. Left central venous line is identified unchanged. There is pulmonary edema. There is no focal pneumonia or pleural effusion. Bony structures are stable. IMPRESSION: Pulmonary edema. Electronically Signed   By: Abelardo Diesel M.D.   On: 03/30/2021 15:20   DG Abd 1 View  Result Date: 03/31/2021 CLINICAL DATA:  Feeling ill EXAM: ABDOMEN - 1 VIEW COMPARISON:  None. FINDINGS: Nonobstructive pattern of bowel gas with gas present to the rectum. No large burden of stool. No free air in the abdomen on supine radiographs. No radio-opaque calculi or other significant radiographic abnormality are seen. IMPRESSION: Nonobstructive pattern of bowel gas with gas present to the rectum. No large burden of stool. No free air in the abdomen on supine radiographs. Electronically Signed   By: Delanna Ahmadi M.D.   On: 03/31/2021 09:12   CT CHEST WO CONTRAST  Result Date: 04/08/2021 CLINICAL DATA:  Chest pain EXAM: CT CHEST WITHOUT CONTRAST TECHNIQUE: Multidetector CT imaging of the chest was performed following the standard protocol without IV contrast. COMPARISON:  CT chest dated March 30, 2021 FINDINGS: Cardiovascular: Cardiomegaly. No pericardial effusion. No significant coronary artery  calcifications. Atherosclerotic disease of the thoracic aorta. Left chest wall port with tip near the superior cavoatrial junction. Mediastinum/Nodes: Mildly enlarged left axillary lymph node measuring 1.2 cm in short axis, unchanged compared to prior exam. Unchanged subcarinal lymph node measuring 1.3 cm in short axis on series 2, image 61. Lungs/Pleura: Central airways are patent. Bilateral patchy ground-glass opacities, increased compared to prior exam. Smooth interlobular septal thickening. Trace bilateral pleural effusions and atelectasis. Upper Abdomen: No acute abnormality. Musculoskeletal: No chest wall mass or suspicious bone lesions identified. IMPRESSION: 1. Patchy bilateral ground-glass opacities and smooth interlobular septal thickening, increased compared to prior exam, differential includes pulmonary edema versus multifocal infection. 2. Trace bilateral pleural effusions and atelectasis. 3. Unchanged mildly enlarged mediastinal and left axillary lymph nodes, likely reactive. 4.  Aortic Atherosclerosis (ICD10-I70.0). Electronically Signed   By: Yetta Glassman M.D.   On: 04/08/2021 08:17   CT Angio Chest  PE W and/or Wo Contrast  Result Date: 03/30/2021 CLINICAL DATA:  Shortness of breath EXAM: CT ANGIOGRAPHY CHEST WITH CONTRAST TECHNIQUE: Multidetector CT imaging of the chest was performed using the standard protocol during bolus administration of intravenous contrast. Multiplanar CT image reconstructions and MIPs were obtained to evaluate the vascular anatomy. CONTRAST:  70m OMNIPAQUE IOHEXOL 350 MG/ML SOLN COMPARISON:  Chest CT dated Oct 03, 2020 FINDINGS: Cardiovascular: Adequate contrast opacification of the pulmonary arteries normal heart size. No pericardial effusion. No evidence of central pulmonary embolus. Limited evaluation of the segmental and subsegmental pulmonary arteries due to streak artifact related to arm position. Minimal atherosclerotic disease of the thoracic aorta.  Mediastinum/Nodes: Mildly enlarged mediastinal lymph nodes. Reference subcarinal lymph node measuring 1.3 cm in short axis on series 4, image 57. Lungs/Pleura: Diffuse bilateral ground-glass opacities with more focal ground-glass opacities of the upper lobes and areas of interlobular septal thickening. No pleural effusion or pneumothorax. Upper Abdomen: No acute abnormality. Skin thickening of the right breast. Musculoskeletal: No chest wall abnormality. No acute or significant osseous findings. Review of the MIP images confirms the above findings. IMPRESSION: 1. No evidence of central pulmonary embolus. Limited evaluation of the segmental and subsegmental pulmonary arteries due to streak artifact related to arm position. 2. Diffuse bilateral ground-glass opacities, differential includes pulmonary edema or infectious process including viral pneumonia. 3. Mildly enlarged mediastinal lymph nodes, likely reactive. Electronically Signed   By: LYetta GlassmanM.D.   On: 03/30/2021 18:03   MR BRAIN WO CONTRAST  Result Date: 04/06/2021 CLINICAL DATA:  Encephalopathy and fever of uncertain origin EXAM: MRI HEAD WITHOUT CONTRAST TECHNIQUE: Multiplanar, multiecho pulse sequences of the brain and surrounding structures were obtained without intravenous contrast. COMPARISON:  None. FINDINGS: Markedly motion degraded study. Brain: No acute infarct, mass effect or extra-axial collection. No acute or chronic hemorrhage. Normal white matter signal, parenchymal volume and CSF spaces. The midline structures are normal. Vascular: Major flow voids are preserved. Skull and upper cervical spine: Normal calvarium and skull base. Visualized upper cervical spine and soft tissues are normal. Sinuses/Orbits:No paranasal sinus fluid levels or advanced mucosal thickening. No mastoid or middle ear effusion. Normal orbits. IMPRESSION: 1. Markedly motion degraded study. 2. No acute intracranial abnormality. Electronically Signed   By: KUlyses JarredM.D.   On: 04/06/2021 22:16   MR BRAIN W WO CONTRAST  Result Date: 04/08/2021 CLINICAL DATA:  Transient ischemic attack (TIA); acute stroke, follow up Neuro deficit, acute, stroke suspected EXAM: MRI HEAD WITHOUT AND WITH CONTRAST TECHNIQUE: Multiplanar, multiecho pulse sequences of the brain and surrounding structures were obtained without and with intravenous contrast. CONTRAST:  926mGADAVIST GADOBUTROL 1 MMOL/ML IV SOLN COMPARISON:  04/06/2021 FINDINGS: Motion artifact is present. Brain: There is no acute infarction or intracranial hemorrhage. There is no intracranial mass, mass effect, or edema. There is no hydrocephalus or extra-axial fluid collection. Ventricles and sulci are normal in size and configuration. Patchy foci of T2 hyperintensity in the supratentorial white matter are nonspecific but may reflect stable chronic microvascular ischemic changes. No definite abnormal enhancement. Vascular: Major vessel flow voids at the skull base are preserved. Skull and upper cervical spine: Normal marrow signal is preserved. Sinuses/Orbits: Mild patchy mucosal thickening. Orbits are unremarkable. Other: Sella is unremarkable.  Mastoid air cells are clear. IMPRESSION: Motion degraded. No acute abnormality.  No definite abnormal enhancement. Electronically Signed   By: PrMacy Mis.D.   On: 04/08/2021 19:39   CT Abdomen Pelvis W Contrast  Result Date:  03/28/2021 CLINICAL DATA:  Abdominal pain, acute, nonlocalized. EXAM: CT ABDOMEN AND PELVIS WITH CONTRAST TECHNIQUE: Multidetector CT imaging of the abdomen and pelvis was performed using the standard protocol following bolus administration of intravenous contrast. CONTRAST:  73m OMNIPAQUE IOHEXOL 350 MG/ML SOLN COMPARISON:  10/03/2020. FINDINGS: Lower chest: The distal tip of a right central venous catheter terminates at the cavoatrial junction. The heart is normal in size. There is interstitial prominence and atelectasis at the lung bases. A few  scattered pulmonary nodules are noted bilaterally, the largest in the right middle lobe measuring 5 mm, axial image 27. Hepatobiliary: No focal liver abnormality is seen. No gallstones, gallbladder wall thickening, or biliary dilatation. Pancreas: Unremarkable. No pancreatic ductal dilatation or surrounding inflammatory changes. Spleen: Normal in size without focal abnormality. Adrenals/Urinary Tract: Adrenal glands are unremarkable. Kidneys are normal, without renal calculi, focal lesion, or hydronephrosis. A subcentimeter hypodensity is present in the lower pole of the left kidney which is too small to further characterize. The urinary bladder is within normal limits for degree of distension. Stomach/Bowel: No bowel obstruction, free air or pneumatosis. The stomach is unremarkable. A few scattered diverticula are present along the colon without evidence of diverticulitis. No focal bladder wall thickening is seen. The appendix is within normal limits. Vascular/Lymphatic: Aortic atherosclerosis. No enlarged abdominal or pelvic lymph nodes. Reproductive: The uterus is in-situ and contains a few hyperdense lesions suggesting fibroids. Other: No free fluid in the pelvis. Musculoskeletal: Surgical changes, subcutaneous fat stranding and skin thickening are noted over the right breast, compatible with history of radiation therapy. No suspicious osseous lesion is identified. Degenerative changes are present in the thoracolumbar spine. IMPRESSION: 1. Interstitial prominence at the lung bases which is new from the prior exam and may be associated with edema or pneumonitis. 2. Nonspecific scattered pulmonary nodules at the lung bases. Short-term follow-up is recommended to exclude the possibility of neoplastic process. 3. Postsurgical changes in the right breast with skin thickening, compatible with history of radiation therapy. Electronically Signed   By: LBrett FairyM.D.   On: 03/28/2021 21:45   DG Chest Port 1  View  Result Date: 04/09/2021 CLINICAL DATA:  Status post bronchoscopy, previous abnormal chest x-ray EXAM: PORTABLE CHEST 1 VIEW COMPARISON:  04/05/2021 FINDINGS: Single frontal view of the chest demonstrates stable left chest wall port. Cardiac silhouette is unremarkable. Progression of the consolidation within the right upper lobe, likely representing post bronchoscopy changes. This could reflect edema or hemorrhage. The multifocal ground-glass airspace disease seen elsewhere throughout the lungs is stable. No large effusion or pneumothorax. No acute bony abnormalities. IMPRESSION: 1. Persistent bilateral ground-glass airspace disease consistent with edema or infection. Increased density in the right upper lobe likely represents post bronchoscopy changes, either lavage fluid or hemorrhage. 2. No evidence of pneumothorax. Electronically Signed   By: MRanda NgoM.D.   On: 04/09/2021 16:19   DG Chest Port 1 View  Result Date: 03/28/2021 CLINICAL DATA:  Fever EXAM: PORTABLE CHEST 1 VIEW COMPARISON:  12/18/2020 FINDINGS: Left-sided central venous port tip over the SVC. No focal opacity or pleural effusion. Normal cardiomediastinal silhouette. No pneumothorax. IMPRESSION: No active disease. Electronically Signed   By: KDonavan FoilM.D.   On: 03/28/2021 17:53   EEG adult  Result Date: 04/08/2021 YLora Havens MD     04/08/2021  1:50 PM Patient Name: NAirlie BlumenbergMRN: [id number] Attending: PLora HavensReferring Physician/Provider: KGardiner Barefoot NP Date: 04/08/2021 Duration: 24.15 mins Patient history: 65year old female  with right breast cancer on chemotherapy who presented with altered mental status.  EEG to evaluate for seizure. Level of alertness: Awake AEDs during EEG study: None Technical aspects: This EEG study was done with scalp electrodes positioned according to the 10-20 International system of electrode placement. Electrical activity was acquired at a sampling  rate of _0  and reviewed with a high frequency filter of _1  and a low frequency filter of _2 . EEG data were recorded continuously and digitally stored. Description: The posterior dominant rhythm consists of _3  activity of moderate voltage (25-35 uV) seen predominantly in posterior head regions, symmetric and reactive to eye opening and eye closing. EEG showed intermittent generalized polymorphic 3 to 6 Hz theta-delta slowing, at times with triphasic morphology. Hyperventilation and photic stimulation were not performed.   ABNORMALITY - Intermittent slow, generalized IMPRESSION: This study is suggestive of mild diffuse encephalopathy, nonspecific etiology. No seizures or epileptiform discharges were seen throughout the recording. Priyanka O Yadav   VAS [place] LOWER EXTREMITY VENOUS (DVT)  Result Date: 04/07/2021  Lower Venous DVT Study Patient Name:  LETISHIA ELLIOTT  Date of Exam:   04/07/2021 Medical Rec #: [id number]         Accession #:    [id number] Date of Birth: March 07, 1956         Patient Gender: F Patient Age:   32 years Exam Location:  [facility] Procedure:      VAS [place] LOWER EXTREMITY VENOUS (DVT) Referring Phys: Hosie Poisson --------------------------------------------------------------------------------  Indications: Edema.  Risk Factors: CA patient on chemotherapy. Comparison Study: No previous exams Performing Technologist: Jody Hill RVT, RDMS  Examination Guidelines: A complete evaluation includes B-mode imaging, spectral Doppler, color Doppler, and power Doppler as needed of all accessible portions of each vessel. Bilateral testing is considered an integral part of a complete examination. Limited examinations for reoccurring indications may be performed as noted. The reflux portion of the exam is performed with the patient in reverse Trendelenburg.  +---------+---------------+---------+-----------+----------+--------------+ RIGHT     CompressibilityPhasicitySpontaneityPropertiesThrombus Aging +---------+---------------+---------+-----------+----------+--------------+ CFV      Full           Yes      Yes                                 +---------+---------------+---------+-----------+----------+--------------+ SFJ      Full                                                        +---------+---------------+---------+-----------+----------+--------------+ FV Prox  Full           Yes      Yes                                 +---------+---------------+---------+-----------+----------+--------------+ FV Mid   Full           Yes      Yes                                 +---------+---------------+---------+-----------+----------+--------------+ FV DistalFull           Yes      Yes                                 +---------+---------------+---------+-----------+----------+--------------+  PFV      Full                                                        +---------+---------------+---------+-----------+----------+--------------+ POP      Full           Yes      Yes                                 +---------+---------------+---------+-----------+----------+--------------+ PTV      Full                                                        +---------+---------------+---------+-----------+----------+--------------+ PERO     Full                                                        +---------+---------------+---------+-----------+----------+--------------+   +---------+---------------+---------+-----------+----------+--------------+ LEFT     CompressibilityPhasicitySpontaneityPropertiesThrombus Aging +---------+---------------+---------+-----------+----------+--------------+ CFV      Full           Yes      Yes                                 +---------+---------------+---------+-----------+----------+--------------+ SFJ      Full                                                         +---------+---------------+---------+-----------+----------+--------------+ FV Prox  Full           Yes      Yes                                 +---------+---------------+---------+-----------+----------+--------------+ FV Mid   Full           Yes      Yes                                 +---------+---------------+---------+-----------+----------+--------------+ FV DistalFull           Yes      Yes                                 +---------+---------------+---------+-----------+----------+--------------+ PFV      Full                                                        +---------+---------------+---------+-----------+----------+--------------+  POP      Full           Yes      Yes                                 +---------+---------------+---------+-----------+----------+--------------+ PTV      Full                                                        +---------+---------------+---------+-----------+----------+--------------+ PERO     Full                                                        +---------+---------------+---------+-----------+----------+--------------+     Summary: BILATERAL: - No evidence of deep vein thrombosis seen in the lower extremities, bilaterally. - No evidence of superficial venous thrombosis in the lower extremities, bilaterally. - RIGHT: - A cystic structure is found in the popliteal fossa.  LEFT: - No cystic structure found in the popliteal fossa.  *See table(s) above for measurements and observations. Electronically signed by Servando Snare MD on 04/07/2021 at 4:10:01 PM.    Final    DG C-ARM BRONCHOSCOPY  Result Date: 04/09/2021 C-ARM BRONCHOSCOPY: Fluoroscopy was utilized by the requesting physician.  No radiographic interpretation.   DG FLUORO GUIDE LUMBAR PUNCTURE  Result Date: 04/07/2021 CLINICAL DATA:  Altered mental status. Clinical need for CSF analysis. EXAM: DIAGNOSTIC LUMBAR PUNCTURE UNDER  FLUOROSCOPIC GUIDANCE COMPARISON:  MRI of the brain and abdominopelvic CT 04/06/2021. FLUOROSCOPY TIME:  Fluoroscopy Time: 12 seconds of low-dose pulsed fluoroscopy Radiation Exposure Index (if provided by the fluoroscopic device): 0.9 mGy Number of Acquired Spot Images: 0 PROCEDURE: Standard time-out was employed. I discussed the risks (including hemorrhage, infection, headache, and nerve damage, among others), benefits, and alternatives to fluoroscopically guided lumbar puncture with the patient's daughter. We specifically discussed the high technical likelihood of success of the procedure. The patient's daughter understood and elected to undergo the procedure. An appropriate site for lumbar puncture was marked on the patient's skin under fluoroscopic guidance. Following sterile skin prep and local anesthetic administration consisting of 1 percent lidocaine, a 22 gauge spinal needle was advanced without difficulty into the thecal sac at the at the L2-3 level under fluoroscopic guidance. Clear CSF was returned. Opening pressure was 14 cm of water measured prone through the needle. 10.5 cc of clear CSF was collected. The needle was subsequently removed and the skin cleansed and bandaged. No immediate complications were observed. IMPRESSION: Diagnostic lumbar puncture performed without complication. Electronically Signed   By: Richardean Sale M.D.   On: 04/07/2021 15:42      Subjective: Patient seen and examined at the bedside this morning.  Hemodynamically stable for discharge  Discharge Exam: Vitals:   04/15/21 0640 04/15/21 1516  BP: 125/73 (!) 172/89  Pulse: 80 84  Resp: 20 16  Temp: 98.2 F (36.8 C) 99 F (37.2 C)  SpO2: 98% 100%   Vitals:   04/14/21 2110 04/15/21 0306 04/15/21 0640 04/15/21 1516  BP: 134/75 121/74 125/73 (!) 172/89  Pulse: 86 82 80 84  Resp:  19 20  16  Temp: 98.2 F (36.8 C) 98.4 F (36.9 C) 98.2 F (36.8 C) 99 F (37.2 C)  TempSrc: Oral Oral Oral Oral  SpO2: 96%  100% 98% 100%  Weight:      Height:        General: Pt is alert, awake, not in acute distress Cardiovascular: RRR, S1/S2 +, no rubs, no gallops Respiratory: CTA bilaterally, no wheezing, no rhonchi Abdominal: Soft, NT, ND, bowel sounds + Extremities: no edema, no cyanosis    The results of significant diagnostics from this hospitalization (including imaging, microbiology, ancillary and laboratory) are listed below for reference.     Microbiology: Recent Results (from the past 240 hour(s))  Culture, blood (routine x 2)     Status: None   Collection Time: 04/05/21  3:24 PM   Specimen: BLOOD  Result Value Ref Range Status   Specimen Description   Final    BLOOD BLOOD LEFT HAND Performed at [facility], [place] [address] [place], [place] [address]    Special Requests   Final    BOTTLES DRAWN AEROBIC ONLY Blood Culture adequate volume Performed at [place] [address] [place], [place] [address]    Culture   Final    NO GROWTH 5 DAYS Performed at [name], [place] [address] [place], [place] [address]    Report Status 04/10/2021 FINAL  Final  Culture, blood (routine x 2)     Status: None   Collection Time: 04/05/21  3:24 PM   Specimen: BLOOD  Result Value Ref Range Status   Specimen Description   Final    BLOOD BLOOD LEFT WRIST Performed at [place] [address], [place], [place] [address]    Special Requests   Final    BOTTLES DRAWN AEROBIC ONLY Blood Culture adequate volume Performed at [place] [address], [place], [place] [address]    Culture   Final    NO GROWTH 5 DAYS Performed at [facility], [place] [address] [place], [place] [address]    Report Status 04/10/2021 FINAL  Final  Expectorated Sputum Assessment w Gram Stain, Rflx to Resp Cult     Status: None   Collection Time: 04/06/21 10:00 AM   Specimen: Expectorated Sputum  Result Value Ref Range  Status   Specimen Description EXPECTORATED SPUTUM  Final   Special Requests NONE  Final   Sputum evaluation   Final    THIS SPECIMEN IS ACCEPTABLE FOR SPUTUM CULTURE Performed at [facility], [place] [address], [place], [place] [address]    Report Status 04/06/2021 FINAL  Final  Culture, Respiratory w Gram Stain     Status: None   Collection Time: 04/06/21 10:00 AM  Result Value Ref Range Status   Specimen Description   Final    EXPECTORATED SPUTUM Performed at [facility], [place] [address], [place], [place] [address]    Special Requests   Final    NONE Reflexed from [phone] [phone] Performed at [facility], [place] [address] [place], [place] [address]    Gram Stain   Final    FEW SQUAMOUS EPITHELIAL CELLS PRESENT FEW WBC SEEN FEW YEAST MODERATE GRAM POSITIVE RODS MODERATE GRAM POSITIVE COCCI    Culture   Final    ABUNDANT Normal respiratory flora-no Staph aureus or Pseudomonas seen Performed at [facility], [address] [place], [place] [address]    Report  Status 04/08/2021 FINAL  Final  Respiratory (~20 pathogens) panel by PCR     Status: None   Collection Time: 04/07/21  9:29 AM   Specimen: Nasopharyngeal Swab; Respiratory  Result Value Ref Range Status   Adenovirus NOT DETECTED NOT DETECTED Final   Coronavirus 229E NOT DETECTED NOT DETECTED Final    Comment: (NOTE) The Coronavirus on the Respiratory Panel, DOES NOT test for the novel  Coronavirus (2019 nCoV)    Coronavirus HKU1 NOT DETECTED NOT DETECTED Final   Coronavirus NL63 NOT DETECTED NOT DETECTED Final   Coronavirus OC43 NOT DETECTED NOT DETECTED Final   Metapneumovirus NOT DETECTED NOT DETECTED Final   Rhinovirus / Enterovirus NOT DETECTED NOT DETECTED Final   Influenza A NOT DETECTED NOT DETECTED Final   Influenza B NOT DETECTED NOT DETECTED Final   Parainfluenza Virus 1 NOT DETECTED NOT DETECTED Final   Parainfluenza Virus 2 NOT DETECTED NOT DETECTED  Final   Parainfluenza Virus 3 NOT DETECTED NOT DETECTED Final   Parainfluenza Virus 4 NOT DETECTED NOT DETECTED Final   Respiratory Syncytial Virus NOT DETECTED NOT DETECTED Final   Bordetella pertussis NOT DETECTED NOT DETECTED Final   Bordetella Parapertussis NOT DETECTED NOT DETECTED Final   Chlamydophila pneumoniae NOT DETECTED NOT DETECTED Final   Mycoplasma pneumoniae NOT DETECTED NOT DETECTED Final    Comment: Performed at [facility], [place] [address], [place], [place] [address]  Acid Fast Smear (AFB)     Status: None   Collection Time: 04/07/21 10:28 AM   Specimen: Sputum  Result Value Ref Range Status   AFB Specimen Processing Concentration  Final   Acid Fast Smear Negative  Final    Comment: (NOTE) Performed At: [facility] [place], [place] [id number] Rush Farmer MD IO:[id number]    Source (AFB) EXPECTORATED SPUTUM  Final    Comment: Performed at [facility], [place] [address], [place], [place] [address]  CSF culture w Stat Gram Stain     Status: None   Collection Time: 04/07/21  2:28 PM   Specimen: CSF; Cerebrospinal Fluid  Result Value Ref Range Status   Specimen Description   Final    CSF Performed at [place] [address], [place], [place] [address]    Special Requests   Final    NONE Performed at [facility], [place] [address] [place], [place] [address]    Gram Stain   Final    NO WBC SEEN NO ORGANISMS SEEN CYTOSPIN SMEAR Gram Stain Report Called to,Read Back By and Verified With: R.MICHAELS, RN AT 1850 ON 11.21.22 BY N.THOMPSON Performed at [place] [address], [place], [place] [address]    Culture   Final    NO GROWTH Performed at [facility], [place] [address] [place], [place] [address]    Report Status 04/11/2021 FINAL  Final  Fungus Culture With Stain     Status: None (Preliminary result)   Collection Time: 04/07/21  2:28 PM    Specimen: PATH Cytology CSF; Cerebrospinal Fluid  Result Value Ref Range Status   Fungus Stain Final report  Final    Comment: (NOTE) Performed At: [facility] [address] [place], [place] [id number] Rush Farmer MD [id number]    Fungus (Mycology) Culture PENDING  Incomplete   Fungal Source CSF  Final    Comment: Performed at [facility], [place] [address], [place], [place] [address]  Anaerobic culture  w Gram Stain     Status: None   Collection Time: 04/07/21  2:28 PM   Specimen: PATH Cytology CSF; Cerebrospinal Fluid  Result Value Ref Range Status   Specimen Description   Final    CSF Performed at [place] [address], [place], [place] [address]    Special Requests   Final    NONE Performed at [facility], [place] [address], [place], [place] [address]    Gram Stain NO WBC SEEN NO ORGANISMS SEEN CYTOSPIN SMEAR   Final   Culture   Final    NO ANAEROBES ISOLATED Performed at [facility], [place] [address] [place], [place] [address]    Report Status 04/13/2021 FINAL  Final  Fungus Culture Result     Status: None   Collection Time: 04/07/21  2:28 PM  Result Value Ref Range Status   Result 1 Comment  Final    Comment: (NOTE) KOH/Calcofluor preparation:  no fungus observed. Performed At: [facility] [place], [place] [id number] Rush Farmer MD [id number]   Pneumocystis smear by DFA     Status: None   Collection Time: 04/09/21  2:49 PM   Specimen: Bronchial Alveolar Lavage; Respiratory  Result Value Ref Range Status   Specimen Source-PJSRC BRONCHIAL ALVEOLAR LAVAGE  Final    Comment: RUL   Pneumocystis jiroveci Ag NEGATIVE  Final    Comment: Performed at [facility], [place] [address], [place], [place] [address]  Aspergillus Ag, BAL/Serum     Status: None   Collection Time: 04/09/21  2:49 PM   Specimen: Bronchial Alveolar Lavage; Respiratory   Result Value Ref Range Status   Aspergillus Ag, BAL/Serum 0.06 0.00 - 0.49 Index Final    Comment: (NOTE) Performed At: [facility] [place], [place] [id number] Rush Farmer MD [id number]   Fungus Culture With Stain     Status: None (Preliminary result)   Collection Time: 04/09/21  2:49 PM   Specimen: Bronchial Alveolar Lavage; Respiratory  Result Value Ref Range Status   Fungus Stain Final report  Final    Comment: (NOTE) Performed At: [facility] [place], [place] [id number] Rush Farmer MD [id number]    Fungus (Mycology) Culture PENDING  Incomplete   Fungal Source BRONCHIAL ALVEOLAR LAVAGE  Final    Comment: RUL Performed at [facility], [place] [address], [place], [place] [address]   Culture, Respiratory w Gram Stain     Status: None   Collection Time: 04/09/21  2:49 PM   Specimen: Bronchial Alveolar Lavage; Respiratory  Result Value Ref Range Status   Specimen Description   Final    BRONCHIAL ALVEOLAR LAVAGE RUL Performed at [place] [address], [place], [place] [address]    Special Requests   Final    NONE Performed at [facility], [place] [address], [place],  [address]    Gram Stain   Final    FEW WBC PRESENT, PREDOMINANTLY MONONUCLEAR NO ORGANISMS SEEN    Culture   Final    NO GROWTH 2 DAYS Performed at [place] [address], [place],  [address]    Report Status 04/11/2021 FINAL  Final  Anaerobic culture w Gram Stain     Status: None   Collection Time: 04/09/21  2:49 PM   Specimen: Bronchial Alveolar Lavage; Respiratory  Result Value Ref Range Status   Specimen Description  Final    BRONCHIAL ALVEOLAR LAVAGE RUL Performed at [facility], [place] [address], [place], [place] [address]    Special Requests   Final    NONE Performed at [facility], [place] [address],  [place], [place] [address]    Gram Stain   Final    FEW WBC PRESENT, PREDOMINANTLY MONONUCLEAR NO ORGANISMS SEEN    Culture   Final    NO ANAEROBES ISOLATED Performed at [facility], [place] [address], [place], [place] [address]    Report Status 04/14/2021 FINAL  Final  Fungus Culture Result     Status: None   Collection Time: 04/09/21  2:49 PM  Result Value Ref Range Status   Result 1 Comment  Final    Comment: (NOTE) KOH/Calcofluor preparation:  no fungus observed. Performed At: [facility] [place], [place] [id number] Rush Farmer MD XB:[id number]      Labs: BNP (last 3 results) Recent Labs    10/08/20 0852 11/12/20 1544 03/30/21 1439  BNP 42.4 12.0 16.3   Basic Metabolic Panel: Recent Labs  Lab 04/10/21 0339 04/11/21 0611 04/12/21 0405 04/13/21 0414 04/14/21 0237  NA 135 139 137 139 139  K 4.2 3.7 3.6 3.6 3.7  CL 98 98 96* 98 98  CO2 34* 38* 38* 39* 37*  GLUCOSE 161* 101* 92 128* 121*  BUN _0 6* 6*  CREATININE 0.61 0.50 0.50 0.48 0.57  CALCIUM 7.8* 8.1* 8.0* 8.4* 8.4*  MG 1.9 2.0  --  2.0  --   PHOS 1.9* 1.6* 3.4 3.9  --    Liver Function Tests: Recent Labs  Lab 04/09/21 0240 04/10/21 0339 04/13/21 0414 04/14/21 0237  AST 65* 84* 60* 51*  ALT _1 ALKPHOS 96 169* 128* 111  BILITOT 0.2* 0.5 0.4 0.3  PROT 8.1 8.3* 8.0 7.7  ALBUMIN 2.0* 2.1* 2.2* 2.1*   No results for input(s): LIPASE, AMYLASE in the last 168 hours. No results for input(s): AMMONIA in the last 168 hours. CBC: Recent Labs  Lab 04/11/21 0611 04/12/21 0405 04/13/21 0414 04/14/21 0237 04/15/21 0453  WBC 5.0 5.1 5.6 4.5 3.4*  NEUTROABS 2.3  --   --   --  0.7*  HGB 7.8* 7.5* 8.0* 7.6* 8.1*  HCT 26.1* 25.5* 27.3* 25.3* 27.1*  MCV 87.9 89.5 88.9 88.8 88.9  PLT 66* 62* 79* 86* 92*   Cardiac Enzymes: No results for input(s): CKTOTAL, CKMB, CKMBINDEX, TROPONINI in the last 168 hours. BNP: Invalid input(s): POCBNP CBG: No results for  input(s): GLUCAP in the last 168 hours. D-Dimer No results for input(s): DDIMER in the last 72 hours. Hgb A1c No results for input(s): HGBA1C in the last 72 hours. Lipid Profile No results for input(s): CHOL, HDL, LDLCALC, TRIG, CHOLHDL, LDLDIRECT in the last 72 hours. Thyroid function studies No results for input(s): TSH, T4TOTAL, T3FREE, THYROIDAB in the last 72 hours.  Invalid input(s): FREET3 Anemia work up No results for input(s): VITAMINB12, FOLATE, FERRITIN, TIBC, IRON, RETICCTPCT in the last 72 hours. Urinalysis    Component Value Date/Time   COLORURINE STRAW (A) 04/06/2021 0330   APPEARANCEUR CLEAR 04/06/2021 0330   LABSPEC 1.006 04/06/2021 0330   PHURINE 6.0 04/06/2021 0330   GLUCOSEU NEGATIVE 04/06/2021 0330   HGBUR NEGATIVE 04/06/2021 0330   BILIRUBINUR NEGATIVE 04/06/2021 0330   KETONESUR NEGATIVE 04/06/2021 0330   PROTEINUR NEGATIVE 04/06/2021 0330   NITRITE NEGATIVE 04/06/2021 0330   LEUKOCYTESUR  NEGATIVE 04/06/2021 0330   Sepsis Labs Invalid input(s): PROCALCITONIN,  WBC,  LACTICIDVEN Microbiology Recent Results (from the past 240 hour(s))  Culture, blood (routine x 2)     Status: None   Collection Time: 04/05/21  3:24 PM   Specimen: BLOOD  Result Value Ref Range Status   Specimen Description   Final    BLOOD BLOOD LEFT HAND Performed at [facility], [place] [address], [place], [place] [address]    Special Requests   Final    BOTTLES DRAWN AEROBIC ONLY Blood Culture adequate volume Performed at [place] [address] [place], [place] [address]    Culture   Final    NO GROWTH 5 DAYS Performed at [facility], [place] [address] [place], [place] [address]    Report Status 04/10/2021 FINAL  Final  Culture, blood (routine x 2)     Status: None   Collection Time: 04/05/21  3:24 PM   Specimen: BLOOD  Result Value Ref Range Status   Specimen Description   Final    BLOOD BLOOD LEFT WRIST Performed at [place] [address] [place], [place] [address]    Special Requests   Final    BOTTLES DRAWN AEROBIC ONLY Blood Culture adequate volume Performed at [place] [address], [place], [place] [address]    Culture   Final    NO GROWTH 5 DAYS Performed at [facility], [place] [address], [place], [place] [address]    Report Status 04/10/2021 FINAL  Final  Expectorated Sputum Assessment w Gram Stain, Rflx to Resp Cult     Status: None   Collection Time: 04/06/21 10:00 AM   Specimen: Expectorated Sputum  Result Value Ref Range Status   Specimen Description EXPECTORATED SPUTUM  Final   Special Requests NONE  Final   Sputum evaluation   Final    THIS SPECIMEN IS ACCEPTABLE FOR SPUTUM CULTURE Performed at [facility], [place] [address], [place], [place] [address]    Report Status 04/06/2021 FINAL  Final  Culture, Respiratory w Gram Stain     Status: None   Collection Time: 04/06/21 10:00 AM  Result Value Ref Range Status   Specimen Description   Final    EXPECTORATED SPUTUM Performed at [facility], [place] [address] [place], [place] [address]    Special Requests   Final    NONE Reflexed from [phone] Performed at [facility], [place] [address] [place], [place] [address]    Gram Stain   Final    FEW SQUAMOUS EPITHELIAL CELLS PRESENT FEW WBC SEEN FEW YEAST MODERATE GRAM POSITIVE RODS MODERATE GRAM POSITIVE COCCI    Culture   Final    ABUNDANT Normal respiratory flora-no Staph aureus or Pseudomonas seen Performed at [facility], [address] [place], [place] [address]    Report Status 04/08/2021 FINAL  Final  Respiratory (~20 pathogens) panel by PCR     Status: None   Collection Time: 04/07/21  9:29 AM   Specimen: Nasopharyngeal Swab; Respiratory  Result Value Ref Range Status   Adenovirus NOT DETECTED NOT DETECTED Final   Coronavirus 229E NOT DETECTED NOT DETECTED  Final    Comment: (NOTE) The Coronavirus on the Respiratory Panel, DOES NOT test for the novel  Coronavirus (2019 nCoV)    Coronavirus HKU1 NOT DETECTED NOT DETECTED Final   Coronavirus NL63 NOT DETECTED NOT DETECTED  Final   Coronavirus OC43 NOT DETECTED NOT DETECTED Final   Metapneumovirus NOT DETECTED NOT DETECTED Final   Rhinovirus / Enterovirus NOT DETECTED NOT DETECTED Final   Influenza A NOT DETECTED NOT DETECTED Final   Influenza B NOT DETECTED NOT DETECTED Final   Parainfluenza Virus 1 NOT DETECTED NOT DETECTED Final   Parainfluenza Virus 2 NOT DETECTED NOT DETECTED Final   Parainfluenza Virus 3 NOT DETECTED NOT DETECTED Final   Parainfluenza Virus 4 NOT DETECTED NOT DETECTED Final   Respiratory Syncytial Virus NOT DETECTED NOT DETECTED Final   Bordetella pertussis NOT DETECTED NOT DETECTED Final   Bordetella Parapertussis NOT DETECTED NOT DETECTED Final   Chlamydophila pneumoniae NOT DETECTED NOT DETECTED Final   Mycoplasma pneumoniae NOT DETECTED NOT DETECTED Final    Comment: Performed at [facility], [place] [address] [place], [place] [address]  Acid Fast Smear (AFB)     Status: None   Collection Time: 04/07/21 10:28 AM   Specimen: Sputum  Result Value Ref Range Status   AFB Specimen Processing Concentration  Final   Acid Fast Smear Negative  Final    Comment: (NOTE) Performed At: [facility] [place], [place] [id number] Rush Farmer MD [id number]    Source (AFB) EXPECTORATED SPUTUM  Final    Comment: Performed at [facility], [place] [address], [place], [place] [address]  CSF culture w Stat Gram Stain     Status: None   Collection Time: 04/07/21  2:28 PM   Specimen: CSF; Cerebrospinal Fluid  Result Value Ref Range Status   Specimen Description   Final    CSF Performed at [place] [address] [place], [place] [address]    Special Requests   Final    NONE Performed at [facility], [place] [address], [place], [place] [address]    Gram Stain   Final    NO WBC SEEN NO ORGANISMS SEEN CYTOSPIN SMEAR Gram Stain Report Called to,Read Back By and Verified With: R.MICHAELS, RN AT 1850 ON 11.21.22 BY N.THOMPSON Performed at [facility] [address], [place], [place] [address]    Culture   Final    NO GROWTH Performed at [facility], [place] [address] [place], [place] [address]    Report Status 04/11/2021 FINAL  Final  Fungus Culture With Stain     Status: None (Preliminary result)   Collection Time: 04/07/21  2:28 PM   Specimen: PATH Cytology CSF; Cerebrospinal Fluid  Result Value Ref Range Status   Fungus Stain Final report  Final    Comment: (NOTE) Performed At: [facility] [address], [place] [id number] Rush Farmer MD [id number]    Fungus (Mycology) Culture PENDING  Incomplete   Fungal Source CSF  Final    Comment: Performed at [facility], [place] [address], [place], [place] [address]  Anaerobic culture w Gram Stain     Status: None   Collection Time: 04/07/21  2:28 PM   Specimen: PATH Cytology CSF; Cerebrospinal Fluid  Result Value Ref Range Status   Specimen Description   Final    CSF Performed at [facility] [address] [place], [place] [address]    Special Requests   Final    NONE Performed at [facility], [place] [address] [place], [place] [address]    Gram Stain NO WBC SEEN NO ORGANISMS SEEN CYTOSPIN SMEAR  Final   Culture   Final    NO ANAEROBES ISOLATED Performed at [facility], [place] [address], [place], [place] [address]    Report Status 04/13/2021 FINAL  Final  Fungus Culture Result     Status: None   Collection Time: 04/07/21  2:28 PM  Result Value Ref Range Status   Result 1 Comment  Final    Comment: (NOTE) KOH/Calcofluor preparation:  no fungus observed. Performed At: [facility] [place], [place] [id number] Rush Farmer MD [id number]   Pneumocystis smear by DFA     Status: None   Collection Time: 04/09/21  2:49 PM   Specimen: Bronchial Alveolar Lavage; Respiratory  Result Value Ref Range Status   Specimen Source-[facility] BRONCHIAL ALVEOLAR LAVAGE  Final    Comment: RUL   Pneumocystis jiroveci Ag NEGATIVE  Final    Comment: Performed at [facility], [place] [address] [place], [place] [address]  Aspergillus Ag, BAL/Serum     Status: None   Collection Time: 04/09/21  2:49 PM   Specimen: Bronchial Alveolar Lavage; Respiratory  Result Value Ref Range Status   Aspergillus Ag, BAL/Serum 0.06 0.00 - 0.49 Index Final    Comment: (NOTE) Performed At: [facility] [place], [place] [id number] Rush Farmer MD [id number]   Fungus Culture With Stain     Status: None (Preliminary result)   Collection Time: 04/09/21  2:49 PM   Specimen: Bronchial Alveolar Lavage; Respiratory  Result Value Ref Range Status   Fungus Stain Final report  Final    Comment: (NOTE) Performed At: [facility] [place], [place] [id number] Rush Farmer MD [id number]    Fungus (Mycology) Culture PENDING  Incomplete   Fungal Source BRONCHIAL ALVEOLAR LAVAGE  Final    Comment: RUL Performed at [facility], [place] [address] [place], [place] [address]   Culture, Respiratory w Gram Stain     Status: None   Collection Time: 04/09/21  2:49 PM   Specimen: Bronchial Alveolar Lavage; Respiratory  Result Value Ref Range Status   Specimen Description   Final    BRONCHIAL ALVEOLAR LAVAGE RUL Performed at [facility] [address] [place], [place] [address]    Special Requests   Final    NONE Performed at [facility], [place] [address] [place], [place] [address]    Gram Stain   Final    FEW WBC PRESENT, PREDOMINANTLY MONONUCLEAR NO ORGANISMS  SEEN    Culture   Final    NO GROWTH 2 DAYS Performed at [facility] [address], [place], [place] [address]    Report Status 04/11/2021 FINAL  Final  Anaerobic culture w Gram Stain     Status: None   Collection Time: 04/09/21  2:49 PM   Specimen: Bronchial Alveolar Lavage; Respiratory  Result Value Ref Range Status   Specimen Description   Final    BRONCHIAL ALVEOLAR LAVAGE RUL Performed at [facility] [address] [place], [place] [address]    Special Requests   Final    NONE Performed at [facility], [place] [address] [place], [place] [address]    Gram Stain   Final    FEW WBC PRESENT, PREDOMINANTLY MONONUCLEAR NO ORGANISMS SEEN    Culture   Final    NO ANAEROBES ISOLATED Performed at [facility], [place] [address] [place], [place] [address]  Report Status 04/14/2021 FINAL  Final  Fungus Culture Result     Status: None   Collection Time: 04/09/21  2:49 PM  Result Value Ref Range Status   Result 1 Comment  Final    Comment: (NOTE) KOH/Calcofluor preparation:  no fungus observed. Performed At: [facility] [place], [place] [id number] Rush Farmer MD UG:[id number]     Please note: You were cared for by a hospitalist during your hospital stay. Once you are discharged, your primary care physician will handle any further medical issues. Please note that NO REFILLS for any discharge medications will be authorized once you are discharged, as it is imperative that you return to your primary care physician (or establish a relationship with a primary care physician if you do not have one) for your post hospital discharge needs so that they can reassess your need for medications and monitor your lab values.    Time coordinating discharge: 40 minutes  SIGNED:   Shelly Coss, MD  [facility] 04/15/2021, 3:23 PM Pager [phone]  If 7PM-7AM, please contact  night-coverage [email or web address] Password [email or web address]

## 2021-04-15 NOTE — TOC Progression Note (Signed)
Transition of Care [facility]) - Progression Note    Patient Details  Name: Tyshana Nishida MRN: [id number] Date of Birth: 1955-09-17  Transition of Care [facility]) CM/SW Contact  Purcell Mouton, RN Phone Number: 04/15/2021, 1:04 PM  Clinical Narrative:    O2 was delivered to pt's room. 4 wheel walker will deliver to pt's home. Pt and daughter at bedside are aware.    Expected Discharge Plan: [place] Barriers to Discharge: Continued Medical Work up  Expected Discharge Plan and Services Expected Discharge Plan: [place] Choice: [place] arrangements for the past 2 months: Single Family Home Expected Discharge Date: 04/15/21                                     Social Determinants of Health (SDOH) Interventions    Readmission Risk Interventions Readmission Risk Prevention Plan 04/02/2021 10/10/2020 10/07/2020  Transportation Screening Complete Complete Complete  PCP or Specialist Appt within 5-7 Days - Complete Complete  Home Care Screening - Complete Complete  Medication Review (RN CM) - Complete Complete  Medication Review ([place]) Complete - -  PCP or Specialist appointment within 3-5 days of discharge Complete - -  [place] or Home Care Consult Complete - -  SW Recovery Care/Counseling Consult Complete - -  Palliative Care Screening Not Applicable - -  [place] Not Applicable - -

## 2021-04-16 ENCOUNTER — Ambulatory Visit: Payer: Self-pay

## 2021-04-17 ENCOUNTER — Other Ambulatory Visit: Payer: Self-pay

## 2021-04-17 ENCOUNTER — Ambulatory Visit: Payer: Self-pay

## 2021-04-17 ENCOUNTER — Ambulatory Visit
Admission: RE | Admit: 2021-04-17 | Discharge: 2021-04-17 | Disposition: A | Discharge status: Home / Self Care | Payer: Self-pay | Source: Ambulatory Visit | Attending: Radiation Oncology | Admitting: Radiation Oncology

## 2021-04-17 DIAGNOSIS — C50411 Malignant neoplasm of upper-outer quadrant of right female breast: Secondary | ICD-10-CM | POA: Insufficient documentation

## 2021-04-17 DIAGNOSIS — Z171 Estrogen receptor negative status [ER-]: Secondary | ICD-10-CM | POA: Insufficient documentation

## 2021-04-18 ENCOUNTER — Ambulatory Visit: Payer: Self-pay

## 2021-04-21 ENCOUNTER — Other Ambulatory Visit: Payer: Self-pay

## 2021-04-21 ENCOUNTER — Ambulatory Visit
Admission: RE | Admit: 2021-04-21 | Discharge: 2021-04-21 | Disposition: A | Discharge status: Home / Self Care | Payer: Self-pay | Source: Ambulatory Visit | Attending: Radiation Oncology | Admitting: Radiation Oncology

## 2021-04-21 ENCOUNTER — Ambulatory Visit: Payer: Self-pay

## 2021-04-22 ENCOUNTER — Encounter: Payer: Self-pay | Admitting: Radiation Oncology

## 2021-04-22 ENCOUNTER — Ambulatory Visit
Admission: RE | Admit: 2021-04-22 | Discharge: 2021-04-22 | Disposition: A | Discharge status: Home / Self Care | Payer: Self-pay | Source: Ambulatory Visit | Attending: Radiation Oncology | Admitting: Radiation Oncology

## 2021-04-22 NOTE — Progress Notes (Signed)
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 She developed fatigue and anticipated skin changes in the treatment field. She was hospitalized with metabolic encephalopathy and acute respiratory failure with hypoxia and was admitted to the hospital between 03/30/21-04/15/21. Disposition at the conclusion of her visit was to meet with pulmonary medicine.  Plan: The patient will receive a call in about one month from the radiation oncology department. She will continue follow up with Dr. Joretta Bachelor at [facility] as well.   ________________________________________________    Carola Rhine, PAC

## 2021-04-23 ENCOUNTER — Inpatient Hospital Stay: Payer: Self-pay | Admitting: Student

## 2021-04-23 NOTE — Progress Notes (Deleted)
Synopsis: Referred for pneumonitis by Dr. Tawanna Solo  Subjective:   PATIENT ID: Marie Church GENDER: female DOB: 1956-03-29, MRN: [id number]  No chief complaint on file.  65yF with history of R breast cancer with axillary node involvement on active kadcyla/XRT, HFrecoveredEF, DM2, HTN, anemia/thrombocytopenia who had admission 11/13/-11/29 for acute hypoxic/hypercapnic respiratory failure found to have pneumonitis of unclear etiology s/p bronchoscopy with EBUS/BAL/TBLB on 11/23, suspected organizing pneumonia, started on steroid taper.   BAL remarkable for diff of 80% lymphs. TBLB with atypical cells - reactive pneumocytes, nonspecific. 7S, 4R lymph nodes benign. Infectious workup negative, BAL AFB pending.  Otherwise pertinent review of systems is negative.  Past Medical History:  Diagnosis Date   Breast cancer ([place])    undergoing chemothearpy    DM type 2 (diabetes mellitus, type 2) ([place])    reportedly steroid induced   Hypertension      Family History  Problem Relation Age of Onset   Heart failure Neg Hx    Heart disease Neg Hx    Heart attack Neg Hx      Past Surgical History:  Procedure Laterality Date   BREAST LUMPECTOMY Right 11/30/2020   BRONCHIAL BIOPSY  04/09/2021   Procedure: BRONCHIAL BIOPSIES;  Surgeon: [name], MD;  Location: [facility];  Service: Pulmonary;;   BRONCHIAL NEEDLE ASPIRATION BIOPSY  04/09/2021   Procedure: BRONCHIAL NEEDLE ASPIRATION BIOPSIES;  Surgeon: [name], MD;  Location: [facility];  Service: Pulmonary;;   BRONCHIAL WASHINGS  04/09/2021   Procedure: BRONCHIAL WASHINGS;  Surgeon: [name], MD;  Location: [facility];  Service: Pulmonary;;   ENDOBRONCHIAL ULTRASOUND N/A 04/09/2021   Procedure: ENDOBRONCHIAL ULTRASOUND;  Surgeon: [name], MD;  Location: [facility];  Service: Pulmonary;  Laterality: N/A;   HEMOSTASIS CONTROL  04/09/2021   Procedure: HEMOSTASIS CONTROL;  Surgeon: [name], MD;  Location: [facility];  Service: Pulmonary;;   RIGHT/LEFT HEART CATH AND CORONARY ANGIOGRAPHY N/A 10/10/2020   Procedure: RIGHT/LEFT HEART CATH AND CORONARY ANGIOGRAPHY;  Surgeon: Jolaine Artist, MD;  Location: [place] [place] [facility];  Service: Cardiovascular;  Laterality: N/A;   VIDEO BRONCHOSCOPY N/A 04/09/2021   Procedure: VIDEO BRONCHOSCOPY WITH FLUORO;  Surgeon: [name], MD;  Location: [facility];  Service: Pulmonary;  Laterality: N/A;    Social History   Socioeconomic History   Marital status: Single    Spouse name: Not on file   Number of children: Not on file   Years of education: Not on file   Highest education level: Not on file  Occupational History   Not on file  Tobacco Use   Smoking status: Former    Types: Cigarettes   Smokeless tobacco: Never  Vaping Use   Vaping Use: Never used  Substance and Sexual Activity   Alcohol use: Never   Drug use: Never   Sexual activity: Not on file  Other Topics Concern   Not on file  Social History Narrative   Not on file   Social Determinants of Health   Financial Resource Strain: Medium Risk   Difficulty of Paying Living Expenses: Somewhat hard  Food Insecurity: Food Insecurity Present   Worried About Running Out of Food in the Last Year: Sometimes true   Ran Out of Food in the Last Year: Sometimes true  Transportation Needs: Unmet Transportation Needs   Lack of Transportation (Medical): Yes   Lack of Transportation (Non-Medical): Yes  Physical Activity: Not on file  Stress: Stress Concern Present  Feeling of Stress : To some extent  Social Connections: Socially Isolated   Frequency of Communication with Friends and Family: More than three times a week   Frequency of Social Gatherings with Friends and Family: More than three times a week   Attends Religious Services: Never   Marine scientist or Organizations: No   Attends Music therapist: Never   Marital Status:  Divorced  Human resources officer Violence: Not on file     Allergies  Allergen Reactions   Doxycycline Rash   Amoxicillin-Pot Clavulanate Rash    Tolerates Rocephin   Lisinopril Rash     Outpatient Medications Prior to Visit  Medication Sig Dispense Refill   carvedilol (COREG) 12.5 MG tablet Take 1 tablet (12.5 mg total) by mouth 2 (two) times daily with a meal. 60 tablet 0   Dexlansoprazole 30 MG capsule Take 30 mg by mouth daily as needed (for acid reflux).     furosemide (LASIX) 40 MG tablet Take 0.5 tablets (20 mg total) by mouth daily. 15 tablet 11   gabapentin (NEURONTIN) 300 MG capsule Take 300 mg by mouth 2 (two) times daily.     guaiFENesin-dextromethorphan (ROBITUSSIN DM) 100-10 MG/5ML syrup Take 5 mLs by mouth every 4 (four) hours as needed for cough. 118 mL 1   mirtazapine (REMERON) 7.5 MG tablet Take 1 tablet (7.5 mg total) by mouth at bedtime. 30 tablet 0   potassium chloride SA (KLOR-CON M) 20 MEQ tablet Take 1 tablet (20 mEq total) by mouth daily. 60 tablet 3   predniSONE (DELTASONE) 20 MG tablet Take 2 tablets (40 mg total) by mouth daily with breakfast for 6 days. 12 tablet 0   predniSONE (DELTASONE) 20 MG tablet Take 1 tablet (20 mg total) by mouth daily with breakfast for 7 days. 7 tablet 0   prochlorperazine (COMPAZINE) 10 MG tablet Take 10 mg by mouth every 6 (six) hours as needed for nausea or vomiting.     sacubitril-valsartan (ENTRESTO) 24-26 MG Take 1 tablet by mouth 2 (two) times daily. (Patient not taking: No sig reported) 60 tablet 11   spironolactone (ALDACTONE) 25 MG tablet Take 0.5 tablets (12.5 mg total) by mouth daily. 30 tablet 11   No facility-administered medications prior to visit.       Objective:   Physical Exam:  General appearance: 65 y.o., female, NAD, conversant  Eyes: anicteric sclerae, moist conjunctivae; no lid-lag; PERRL, tracking appropriately HENT: NCAT; oropharynx, MMM, no mucosal ulcerations; normal hard and soft palate Neck:  Trachea midline; no lymphadenopathy, no JVD Lungs: CTAB, no crackles, no wheeze, with normal respiratory effort CV: RRR, no MRGs  Abdomen: Soft, non-tender; non-distended, BS present  Extremities: No peripheral edema, radial and DP pulses present bilaterally  Skin: Normal temperature, turgor and texture; no rash Psych: Appropriate affect Neuro: Alert and oriented to person and place, no focal deficit    There were no vitals filed for this visit.   on *** LPM *** RA BMI Readings from Last 3 Encounters:  04/14/21 27.64 kg/m  02/04/21 29.05 kg/m  01/17/21 29.11 kg/m   Wt Readings from Last 3 Encounters:  04/14/21 171 lb 15.3 oz (78 kg)  02/04/21 180 lb (81.6 kg)  01/17/21 180 lb 5.4 oz (81.8 kg)     CBC    Component Value Date/Time   WBC 3.4 (L) 04/15/2021 0453   RBC 3.05 (L) 04/15/2021 0453   HGB 8.1 (L) 04/15/2021 0453   HCT 27.1 (L) 04/15/2021 0762  PLT 92 (L) 04/15/2021 0453   MCV 88.9 04/15/2021 0453   MCH 26.6 04/15/2021 0453   MCHC 29.9 (L) 04/15/2021 0453   RDW 19.3 (H) 04/15/2021 0453   LYMPHSABS 1.8 04/15/2021 0453   MONOABS 0.7 04/15/2021 0453   EOSABS 0.2 04/15/2021 0453   BASOSABS 0.0 04/15/2021 0453    Chest Imaging: CT Chest 04/07/21 reviewed by me remarkable for widespread ggo with nodular consolidation centered on bronchovascular bundles  Pulmonary Functions Testing Results: No flowsheet data found.    Echocardiogram:   TTE 03/03/21 WNL  Heart Catheterization:   10/10/20: Ao = 110/67 (87) LV = 115/19 RA = 5 RV = 43/8 PA = 43/18 (27) PCW = 12 Fick cardiac output/index = 8.2/4.2 PVR = 1.8 WU FA sat = 99% PA sat = 73%, 75%   Assessment: 1. Normal cors 2. 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 Relatively well compensated hemodynamics with high cardiac output    Assessment & Plan:   # Pneumonitis:  Strong chance related to kadcyla exposure (rare but known association with grade 3/4 pneumonitis). Other possibilities include idiopathic, ctd-associated.    Plan: - should probably avoid kadcyla in the future given grade 3 symptoms - prednisone 6 week gradual taper, she'll call us if she deteriorates during taper - repeat CT chest in 6 weeks and clinic appointment       [name], MD [facility] 04/23/2021 12:49 PM

## 2021-05-08 LAB — FUNGUS CULTURE WITH STAIN

## 2021-05-08 LAB — FUNGAL ORGANISM REFLEX

## 2021-05-08 LAB — FUNGUS CULTURE RESULT

## 2021-05-09 LAB — FUNGUS CULTURE WITH STAIN

## 2021-05-09 LAB — FUNGAL ORGANISM REFLEX

## 2021-05-09 LAB — FUNGUS CULTURE RESULT

## 2021-05-21 ENCOUNTER — Encounter (HOSPITAL_COMMUNITY): Payer: Self-pay | Admitting: Emergency Medicine

## 2021-05-21 ENCOUNTER — Emergency Department (HOSPITAL_COMMUNITY): Payer: Self-pay

## 2021-05-21 ENCOUNTER — Emergency Department (HOSPITAL_COMMUNITY)
Admission: EM | Admit: 2021-05-21 | Discharge: 2021-05-22 | Disposition: A | Discharge status: Home / Self Care | Payer: Self-pay | Attending: Emergency Medicine | Admitting: Emergency Medicine

## 2021-05-21 DIAGNOSIS — R000 Tachycardia, unspecified: Secondary | ICD-10-CM | POA: Insufficient documentation

## 2021-05-21 DIAGNOSIS — R5383 Other fatigue: Secondary | ICD-10-CM | POA: Insufficient documentation

## 2021-05-21 DIAGNOSIS — R0602 Shortness of breath: Secondary | ICD-10-CM | POA: Insufficient documentation

## 2021-05-21 DIAGNOSIS — Z853 Personal history of malignant neoplasm of breast: Secondary | ICD-10-CM | POA: Insufficient documentation

## 2021-05-21 LAB — CBC WITH DIFFERENTIAL/PLATELET
Abs Immature Granulocytes: 0.01 10*3/uL (ref 0.00–0.07)
Basophils Absolute: 0 10*3/uL (ref 0.0–0.1)
Basophils Relative: 0 %
Eosinophils Absolute: 0.1 10*3/uL (ref 0.0–0.5)
Eosinophils Relative: 3 %
HCT: 34.9 % — ABNORMAL LOW (ref 36.0–46.0)
Hemoglobin: 10.7 g/dL — ABNORMAL LOW (ref 12.0–15.0)
Immature Granulocytes: 0 %
Lymphocytes Relative: 40 %
Lymphs Abs: 1.3 10*3/uL (ref 0.7–4.0)
MCH: 26.8 pg (ref 26.0–34.0)
MCHC: 30.7 g/dL (ref 30.0–36.0)
MCV: 87.3 fL (ref 80.0–100.0)
Monocytes Absolute: 0.5 10*3/uL (ref 0.1–1.0)
Monocytes Relative: 14 %
Neutro Abs: 1.3 10*3/uL — ABNORMAL LOW (ref 1.7–7.7)
Neutrophils Relative %: 43 %
Platelets: 146 10*3/uL — ABNORMAL LOW (ref 150–400)
RBC: 4 MIL/uL (ref 3.87–5.11)
RDW: 19.2 % — ABNORMAL HIGH (ref 11.5–15.5)
WBC: 3.2 10*3/uL — ABNORMAL LOW (ref 4.0–10.5)
nRBC: 0 % (ref 0.0–0.2)

## 2021-05-21 LAB — COMPREHENSIVE METABOLIC PANEL
ALT: 12 U/L (ref 0–44)
AST: 28 U/L (ref 15–41)
Albumin: 3.1 g/dL — ABNORMAL LOW (ref 3.5–5.0)
Alkaline Phosphatase: 67 U/L (ref 38–126)
Anion gap: 6 (ref 5–15)
BUN: 11 mg/dL (ref 8–23)
CO2: 26 mmol/L (ref 22–32)
Calcium: 9.5 mg/dL (ref 8.9–10.3)
Chloride: 107 mmol/L (ref 98–111)
Creatinine, Ser: 0.62 mg/dL (ref 0.44–1.00)
GFR, Estimated: >60 mL/min (ref >60)
Glucose, Bld: 88 mg/dL (ref 70–99)
Potassium: 3.5 mmol/L (ref 3.5–5.1)
Sodium: 139 mmol/L (ref 135–145)
Total Bilirubin: 0.7 mg/dL (ref 0.3–1.2)
Total Protein: 9 g/dL — ABNORMAL HIGH (ref 6.5–8.1)

## 2021-05-21 LAB — TROPONIN I (HIGH SENSITIVITY): Troponin I (High Sensitivity): 4 ng/L (ref <18)

## 2021-05-21 IMAGING — CR DG CHEST 2V
2 series · 2 of 2 positions shown · non-contrast
Comparison: [DATE].

CLINICAL DATA: Shortness of breath, cough.

EXAM:
CHEST - 2 VIEW

[chest pa]
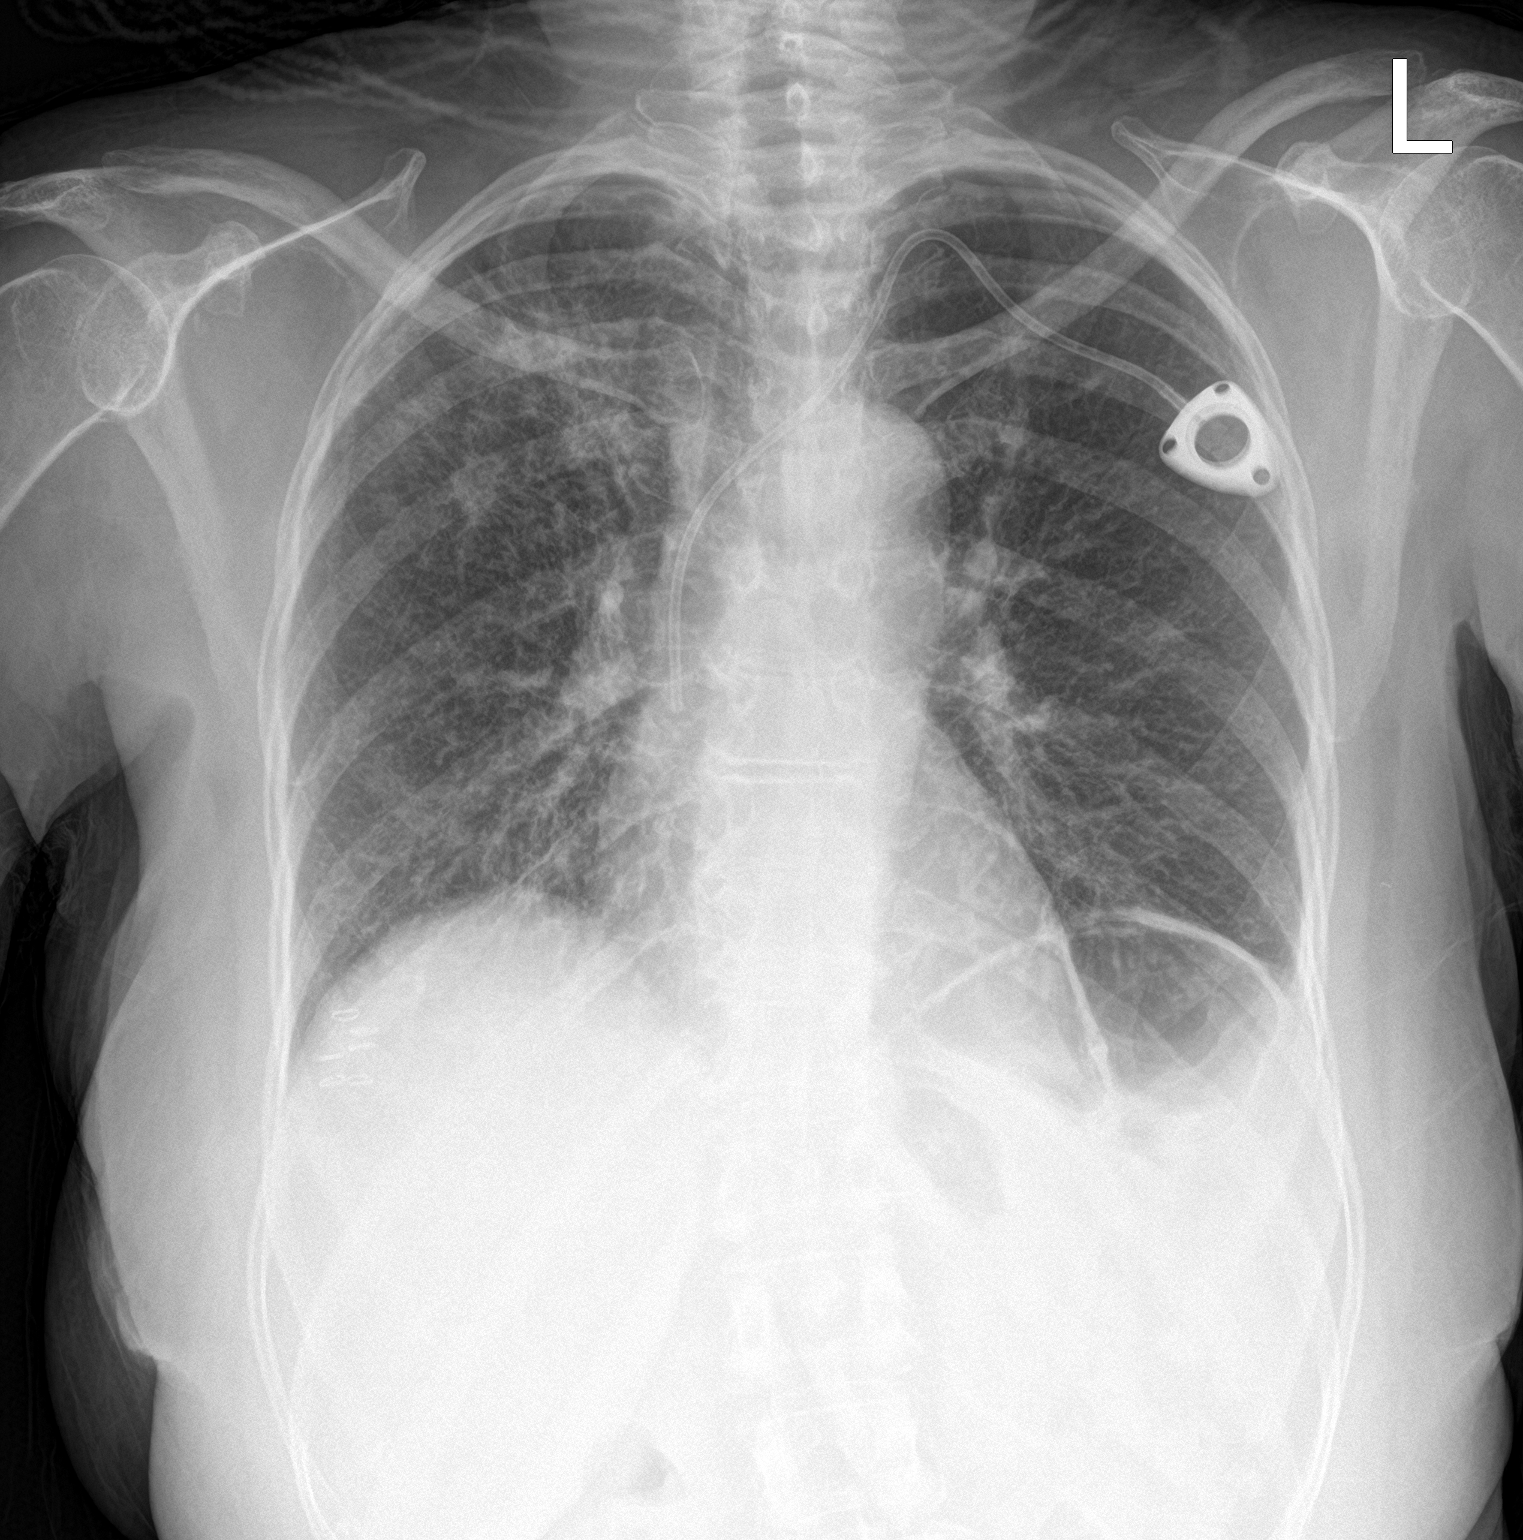

[chest lat]
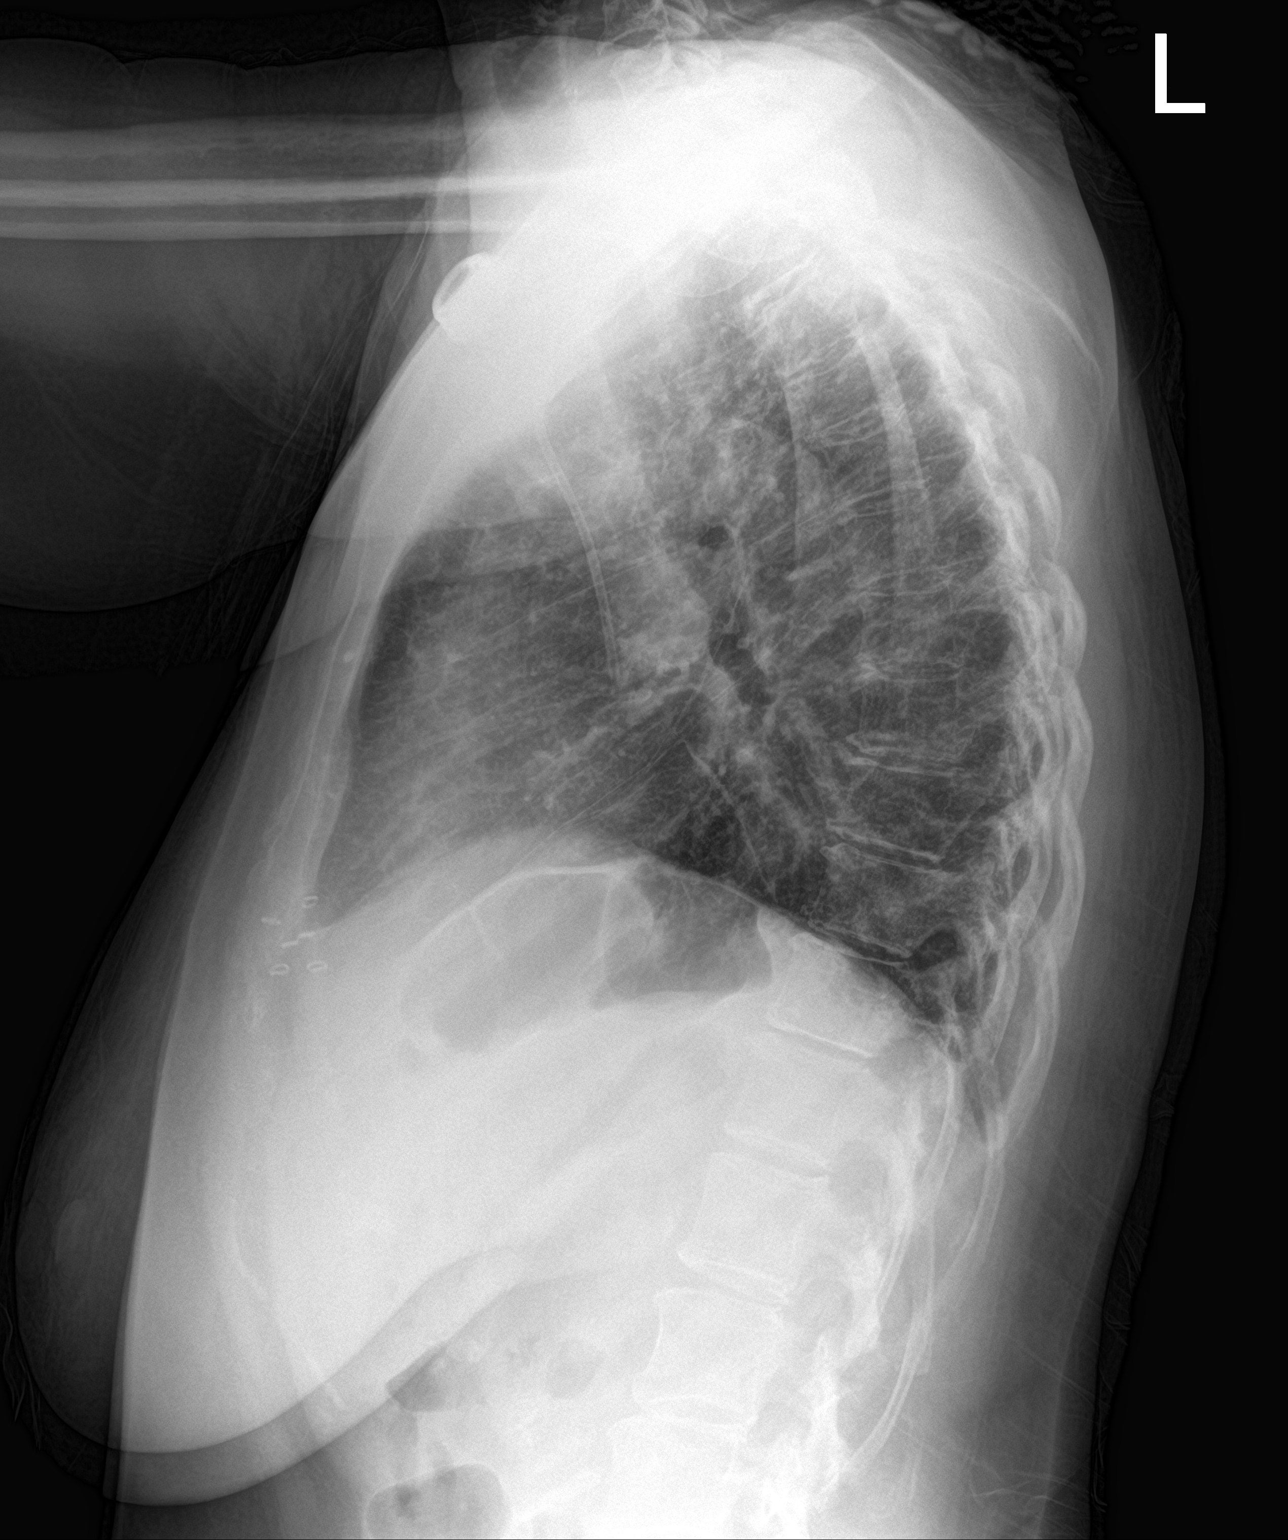

[2 of 2 positions shown; findings below may reference images not displayed]

FINDINGS: The heart size and mediastinal contours are within normal limits.
Left internal jugular Port-A-Cath is unchanged in position. Left
lung is clear. Right upper lobe opacity noted on prior exam is
significantly improved current leak. Residual density does remain
suggesting residual inflammation, atelectasis or possibly scarring.
There is noted a irregular nodular opacity in the right upper lobe
which may represent neoplasm. The visualized skeletal structures are
unremarkable.
IMPRESSION: Significantly improved right upper lobe opacity is noted compared to
prior exam. Irregular nodular opacity is noted in the right upper
lobe concerning for possible neoplasm. CT scan of the chest is
recommended for further evaluation.

## 2021-05-21 NOTE — ED Provider Triage Note (Signed)
Emergency Medicine Provider Triage Evaluation Note  Marie Church , a 66 y.o. female  was evaluated in triage.  Pt complains of shortness of breath after drinking. Denies history of blood clots. No lower extremity edema. Admits to some intermittent chest pain.   Review of Systems  Positive: SOB Negative: fever  Physical Exam  BP (!) 138/105 (BP Location: Left Arm)    Pulse 97    Temp 98.9 F (37.2 C) (Oral)    Resp 18    SpO2 100%  Gen:   Awake, no distress   Resp:  Normal effort  MSK:   Moves extremities without difficulty  Other:    Medical Decision Making  Medically screening exam initiated at 4:52 PM.  Appropriate orders placed.  Marie Church was informed that the remainder of the evaluation will be completed by another provider, this initial triage assessment does not replace that evaluation, and the importance of remaining in the ED until their evaluation is complete.  SOB, labs ordered CXR EKG   Suzy Bouchard, PA-C 05/21/21 1653

## 2021-05-21 NOTE — ED Triage Notes (Signed)
Patient complains of shortness of breath that started a few weeks ago that is exacerbated by eating and drinking.  Patient reports a fullness sensation in her chest from her throat to her stomach. Patient alert, oriented, speaking in complete sentences, and is in no apparent distress at this time.

## 2021-05-22 ENCOUNTER — Emergency Department (HOSPITAL_COMMUNITY): Payer: Self-pay

## 2021-05-22 ENCOUNTER — Other Ambulatory Visit: Payer: Self-pay

## 2021-05-22 ENCOUNTER — Encounter (HOSPITAL_COMMUNITY): Payer: Self-pay

## 2021-05-22 LAB — RESPIRATORY PANEL BY PCR

## 2021-05-22 LAB — TROPONIN I (HIGH SENSITIVITY): Troponin I (High Sensitivity): 4 ng/L (ref <18)

## 2021-05-22 IMAGING — CT CT CHEST W/ CM
2 of 4 series · 14 of 36 positions shown, 17 images · IV contrast (omnipaque)
Comparison: Chest x-ray from yesterday. CT chest dated [DATE].

CLINICAL DATA: Chest pain and shortness of breath. Currently on
chemotherapy for breast cancer.

EXAM:
CT CHEST WITH CONTRAST
TECHNIQUE: Multidetector CT imaging of the chest was performed during
intravenous contrast administration.
CONTRAST:  100mL OMNIPAQUE IOHEXOL 300 MG/ML  SOLN

[Series 3: chest wo · axial · 0.73mm/px · z∈[+1266,+1500]mm · 11 of 137 slices shown, 14 images]
[im 10/137  mediastinal]
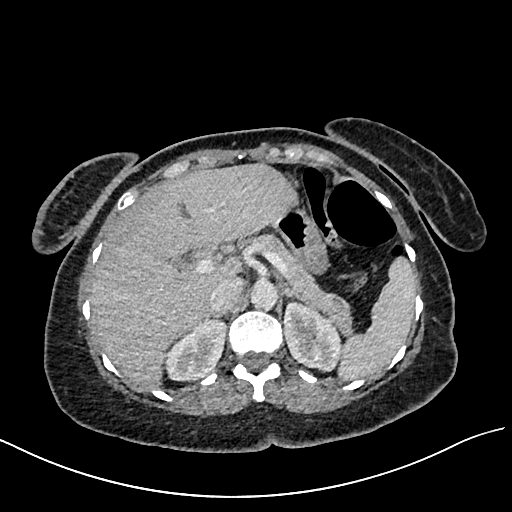
[im 10/137  lung]
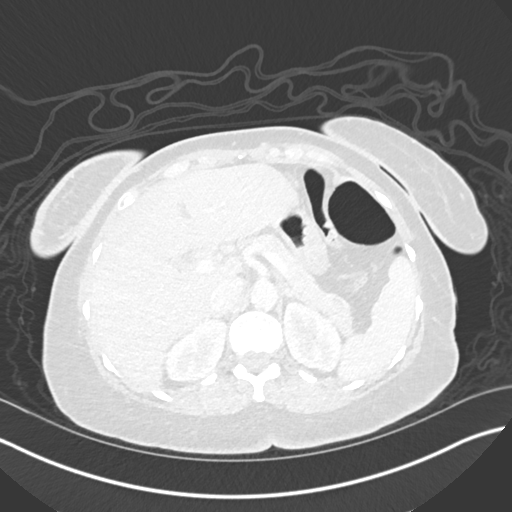
[im 20/137  lung]
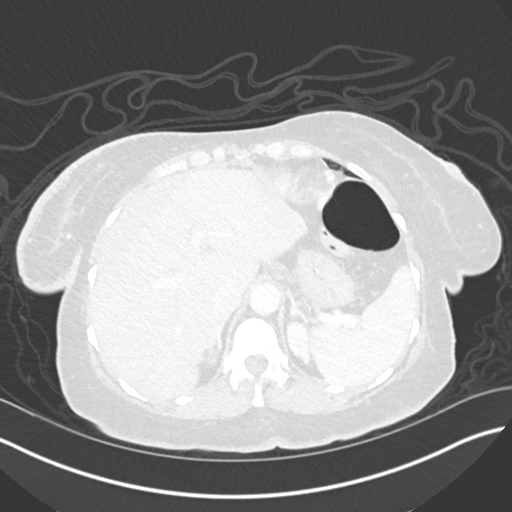
[im 30/137  lung]
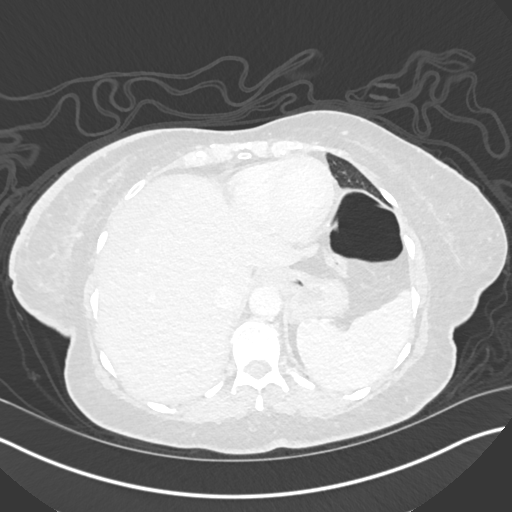
[im 49/137  lung]
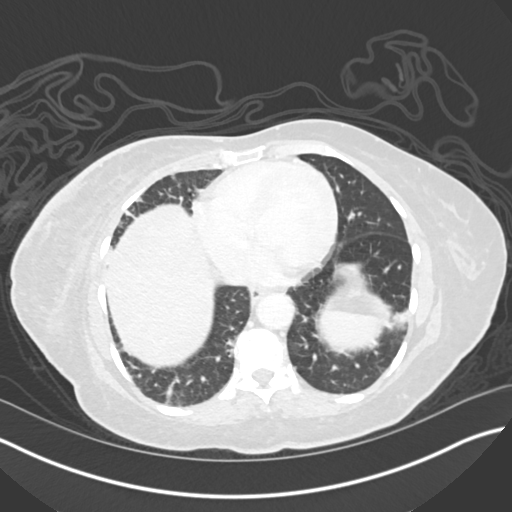
[im 59/137  mediastinal]
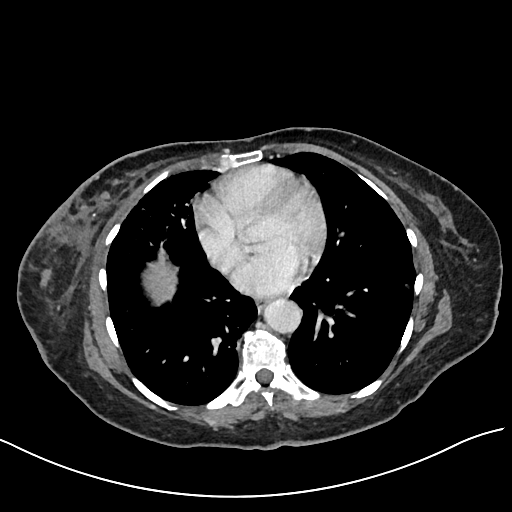
[im 59/137  lung]
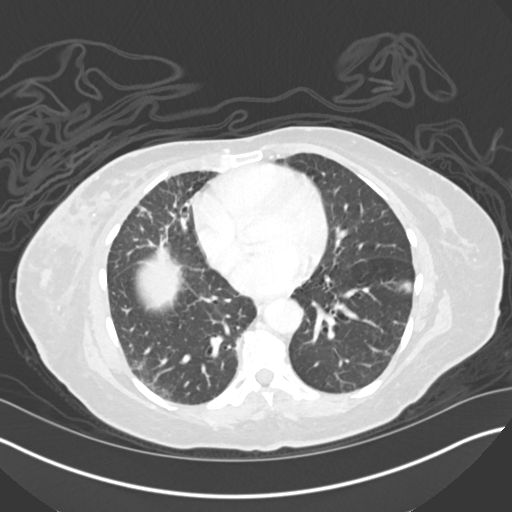
[im 69/137  lung]
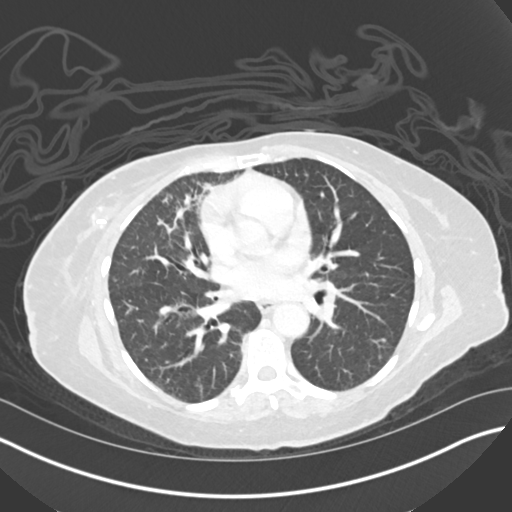
[im 78/137  lung]
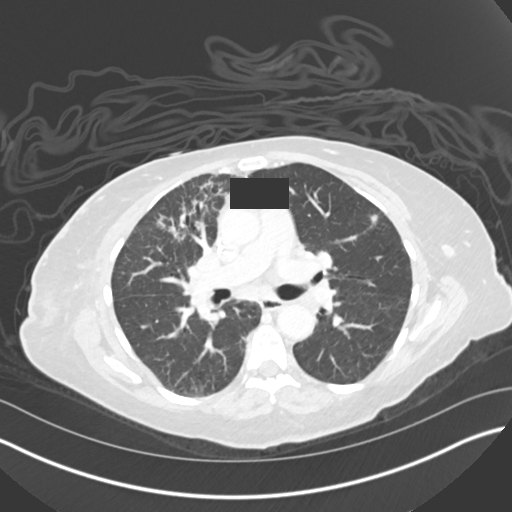
[im 88/137  lung]
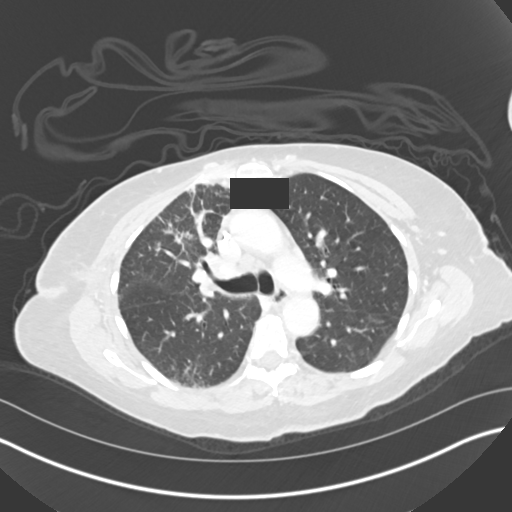
[im 107/137  mediastinal]
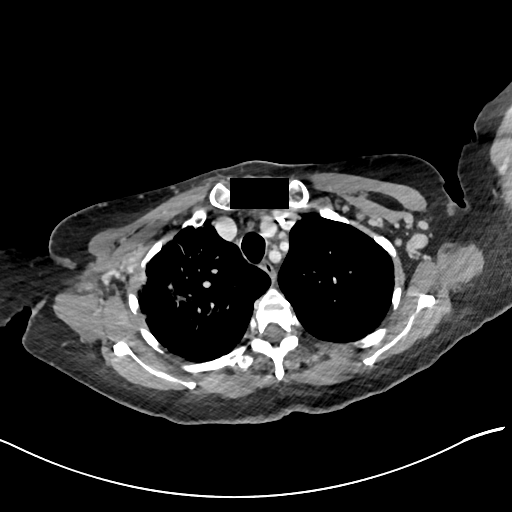
[im 107/137  lung]
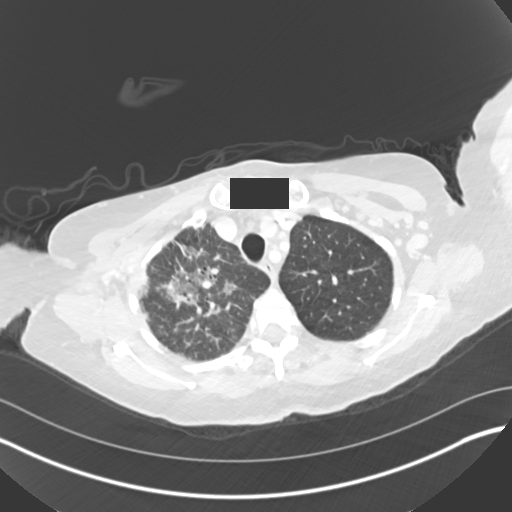
[im 117/137  lung]
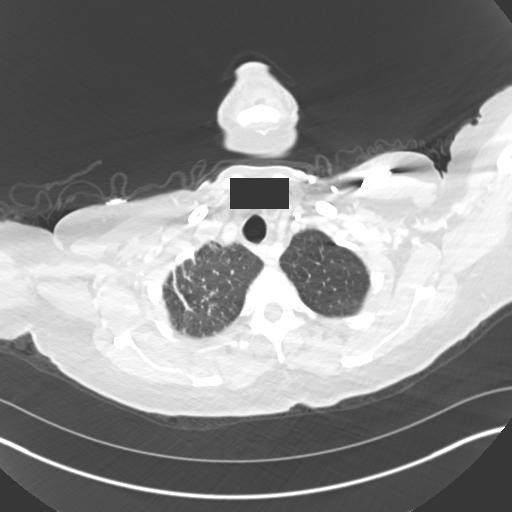
[im 127/137  lung]
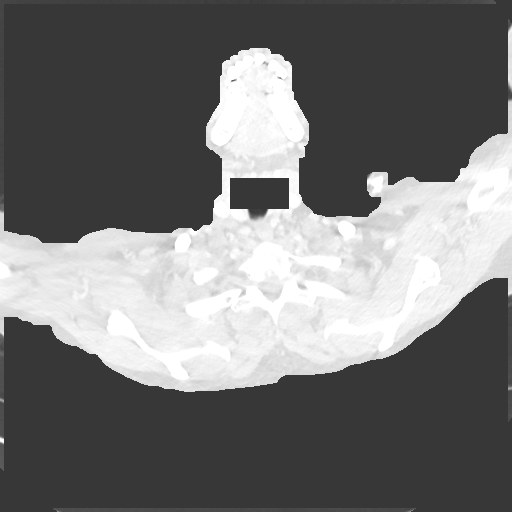

[Series 6: cor · coronal · 0.57mm/px · 3 of 142 slices shown]
[im 29/142  lung]
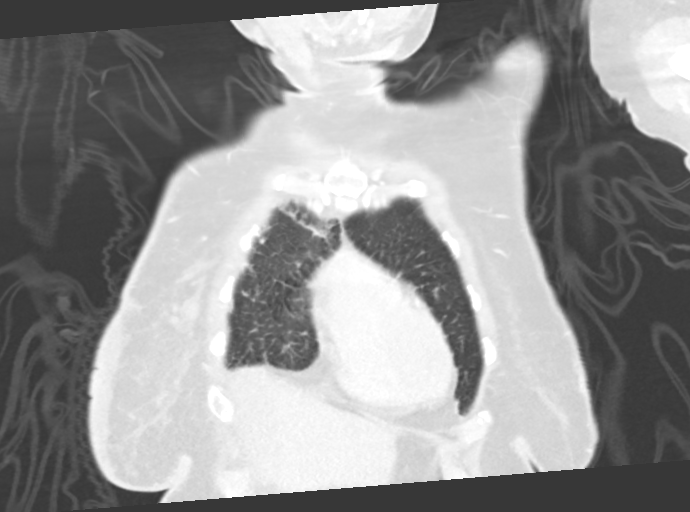
[im 57/142  lung]
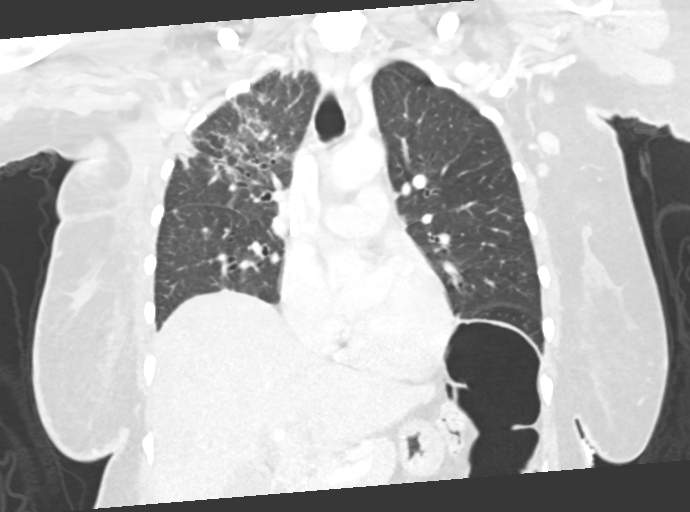
[im 85/142  lung]
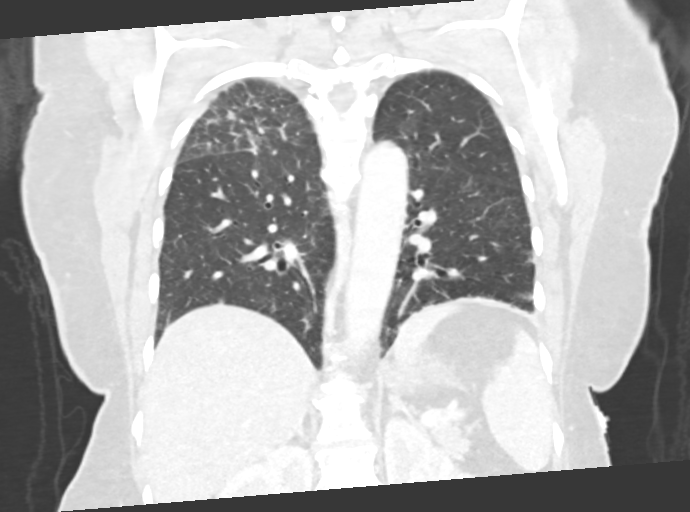

[14 of 36 positions shown; findings below may reference images not displayed]

FINDINGS: Cardiovascular: No significant vascular findings. Normal heart size.
No pericardial effusion. No thoracic aortic aneurysm or dissection.
No central pulmonary embolism. Unchanged right chest wall port
catheter.

Mediastinum/Nodes: Mildly enlarged left axillary lymph nodes are
stable to minimally decreased in size, currently measuring up to
cm in short axis, previously 1.2 cm. Multiple prominent
subcentimeter mediastinal and right hilar lymph nodes measuring up
to 9 mm in short axis, improved compared to prior study. Prior right
axillary lymph node dissection. Unchanged 9 mm hypodense nodule in
the left thyroid lobe. Not clinically significant; no follow-up
imaging recommended. The trachea and esophagus demonstrate no
significant findings.

Lungs/Pleura: Essentially resolved patchy nodular opacities
throughout the left lung and in the right lower lobe. Improved but
unresolved small nodular opacities and septal thickening in the
right upper and middle lobes, with new areas of bronchiectasis.
Scattered small nodules in both lungs obscured on the prior study
but not clearly present on chest CT from [DATE]. For example,
there are 5 x 4 mm pulmonary nodules in the right lower lobe (series
5, image 74) and left upper lobe (series 5, image 60) no pleural
effusion or pneumothorax.

Upper Abdomen: No acute abnormality.

Musculoskeletal: Similar postsurgical changes in the right breast.
No acute or significant osseous findings.
IMPRESSION: 1. Mostly resolved multifocal pneumonia. Improved but continued
nodular airspace disease and septal thickening in the right upper
and middle lobes, favored to reflect a degree of residual or
recurrent infection. Some of the appearance is likely due to
postinflammatory scarring given new areas of bronchiectasis.
2. Scattered small pulmonary nodules in both lungs obscured on the
prior study but not clearly present on chest CT from [DATE]. These
are nonspecific and could be postinflammatory given recent
pneumonia, but in light of the patient's breast cancer history,
attention on follow-up imaging is recommended to exclude metastatic
disease.
3. Improved reactive mediastinal and hilar lymphadenopathy. Stable
to slightly improved left axillary lymphadenopathy, also presumably
reactive.

## 2021-05-22 MED ORDER — IOHEXOL 300 MG/ML  SOLN
100.0000 mL | Freq: Once | INTRAMUSCULAR | Status: AC | PRN
  Administered 2021-05-22: 100 mL via INTRAVENOUS

## 2021-05-22 NOTE — ED Provider Notes (Addendum)
[facility] EMERGENCY DEPARTMENT Provider Note   CSN: [id number] Arrival date & time: 05/21/21  1206     History  Chief Complaint  Patient presents with   Shortness of Breath    Marie Church is a 66 y.o. female with pertinent Hx of anemia, GERD, HFrEF, breast cancer currently undergoing chemotherapy presents with SOB that began yesterday. She reports associated congestion in the chest and dull 5/10 intermittent chest pain that has been ongoing for 2 weeks. Pt also states she becomes SOB after swallowing food. She denies dysphagia or odynophagia. Pt denies fever, chills, headache or changes in vision. No recent falls or sick contacts.  Pt endorse a remote smoking hx of 5 pack years but quit 13 years ago. Drinks alcohol occasionally and denies illicit drug use.  The history is provided by the patient and a relative. No language interpreter was used.  Shortness of Breath Severity:  Mild Onset quality:  Unable to specify Duration:  1 day Timing:  Sporadic Progression:  Partially resolved Chronicity:  New Relieved by:  None tried Worsened by:  Eating Associated symptoms: chest pain   Associated symptoms: no fever, no sore throat and no syncope   Risk factors: hx of cancer       Home Medications Prior to Admission medications   Medication Sig Start Date End Date Taking? Authorizing Provider  acetaminophen (TYLENOL) 500 MG tablet Take 1,000 mg by mouth every 6 (six) hours as needed for moderate pain or headache.   Yes [provider]  carvedilol (COREG) 12.5 MG tablet Take 1 tablet (12.5 mg total) by mouth 2 (two) times daily with a meal. 04/15/21  Yes Adhikari, Amrit, MD  Dexlansoprazole 30 MG capsule Take 30 mg by mouth daily as needed (for acid reflux).   Yes [provider]  furosemide (LASIX) 40 MG tablet Take 0.5 tablets (20 mg total) by mouth daily. 01/08/21  Yes Larey Dresser, MD  guaiFENesin-dextromethorphan [facility] DM) 100-10  MG/5ML syrup Take 5 mLs by mouth every 4 (four) hours as needed for cough. 04/15/21  Yes Adhikari, Tamsen Meek, MD  sacubitril-valsartan (ENTRESTO) 24-26 MG Take 1 tablet by mouth 2 (two) times daily. 10/28/20  Yes Almyra Deforest, PA  mirtazapine (REMERON) 7.5 MG tablet Take 1 tablet (7.5 mg total) by mouth at bedtime. Patient not taking: Reported on 05/22/2021 04/15/21   Shelly Coss, MD  potassium chloride SA (KLOR-CON M) 20 MEQ tablet Take 1 tablet (20 mEq total) by mouth daily. Patient not taking: Reported on 05/22/2021 04/15/21   Shelly Coss, MD  spironolactone (ALDACTONE) 25 MG tablet Take 0.5 tablets (12.5 mg total) by mouth daily. Patient not taking: Reported on 05/22/2021 11/07/20   Croitoru, Dani Gobble, MD      Allergies    Doxycycline, Amoxicillin-pot clavulanate, and Lisinopril    Review of Systems   Review of Systems  Constitutional:  Positive for fatigue. Negative for chills and fever.  HENT:  Negative for sore throat.   Respiratory:  Positive for shortness of breath.   Cardiovascular:  Positive for chest pain. Negative for syncope.  Neurological:  Negative for dizziness and weakness.   Physical Exam Updated Vital Signs BP 131/78 (BP Location: Left Arm)    Pulse 91    Temp 98.3 F (36.8 C) (Oral)    Resp 18    SpO2 96%  Physical Exam Constitutional:      General: She is not in acute distress. HENT:     Head: Normocephalic and atraumatic.  Cardiovascular:     Rate and Rhythm: Tachycardia present.     Heart sounds: Normal heart sounds.  Pulmonary:     Effort: Pulmonary effort is normal.     Breath sounds: Normal air entry.  Skin:    General: Skin is warm and dry.  Neurological:     General: No focal deficit present.     Mental Status: She is alert.  Psychiatric:        Behavior: Behavior normal. Behavior is cooperative.    ED Results / Procedures / Treatments   Labs (all labs ordered are listed, but only abnormal results are displayed) Labs Reviewed  CBC WITH  DIFFERENTIAL/PLATELET - Abnormal; Notable for the following components:      Result Value   WBC 3.2 (*)    Hemoglobin 10.7 (*)    HCT 34.9 (*)    RDW 19.2 (*)    Platelets 146 (*)    Neutro Abs 1.3 (*)    All other components within normal limits  COMPREHENSIVE METABOLIC PANEL - Abnormal; Notable for the following components:   Total Protein 9.0 (*)    Albumin 3.1 (*)    All other components within normal limits  RESPIRATORY PANEL BY PCR  TROPONIN I (HIGH SENSITIVITY)  TROPONIN I (HIGH SENSITIVITY)    EKG EKG Interpretation  Date/Time:  Wednesday May 21 2021 18:59:43 EST Ventricular Rate:  105 PR Interval:  140 QRS Duration: 74 QT Interval:  358 QTC Calculation: 473 R Axis:   12 Text Interpretation: Sinus tachycardia Moderate voltage criteria for LVH, may be normal variant ( R in aVL , Sokolow-Lyon ) Nonspecific T wave abnormality Abnormal ECG When compared with ECG of 30-Mar-2021 15:47, PREVIOUS ECG IS PRESENT No significant change since last tracing Confirmed by Isla Pence [id number]) on 05/22/2021 9:54:39 AM  Radiology DG Chest 2 View  Result Date: 05/21/2021 CLINICAL DATA:  Shortness of breath, cough. EXAM: CHEST - 2 VIEW COMPARISON:  April 09, 2021. FINDINGS: The heart size and mediastinal contours are within normal limits. Left internal jugular Port-A-Cath is unchanged in position. Left lung is clear. Right upper lobe opacity noted on prior exam is significantly improved current leak. Residual density does remain suggesting residual inflammation, atelectasis or possibly scarring. There is noted a irregular nodular opacity in the right upper lobe which may represent neoplasm. The visualized skeletal structures are unremarkable. IMPRESSION: Significantly improved right upper lobe opacity is noted compared to prior exam. Irregular nodular opacity is noted in the right upper lobe concerning for possible neoplasm. CT scan of the chest is recommended for further evaluation.  Electronically Signed   By: Marijo Conception M.D.   On: 05/21/2021 13:06    Procedures Procedures    Medications Ordered in ED Medications - No data to display  ED Course/ Medical Decision Making/ A&P                           Medical Decision Making   #Dysphagia #SOB Pt c/o of SOB since one day ago. She also endorse SOB after swallowing and feeling of congestion in the chest. Vitals stable, pt saturating well on room air, RR were WNL. On exam, pt did not appear to be in respiratory distress. Lungs were clear on ascultation. EKG significant for tachycardia, in which pt endorse anxiety. HR normalized w/o medication. Trops were flat. CXR significant for lung nodule in right upper lobe. However, pt does have hx of breast cancer, with  nodule present on right upper quadrant of right breast, currently receiving chemotherapy. Will obtain CT of the chest to confirm presence of nodule on breast vs lung. Pt recently underwent bronchoscopy for similar findings. Throat symptoms likely associated with recent procedure. Pt will benefit from outpatient GI follow up. Also, pt was recently hospitalized with pneumonia in November. On initial labs, Pt found to be leukopenic on CBC, likely secondary to chemotherapy. Patient is afebrile and denies fever/chills. Since pt complains of SOB, will obtain resp panel to rule out infection.        Final Clinical Impression(s) / ED Diagnoses Final diagnoses:  None    Rx / DC Orders ED Discharge Orders     None         Timothy Lasso, MD 05/22/21 1038    Timothy Lasso, MD 05/22/21 1227    Isla Pence, MD 05/23/21 2135

## 2021-05-22 NOTE — ED Notes (Signed)
Patient transported to CT 

## 2021-05-22 NOTE — Discharge Instructions (Signed)
Please follow up with primary care doctor.

## 2021-05-22 NOTE — ED Notes (Signed)
No answer from pt in waiting room for vital signs

## 2021-05-23 ENCOUNTER — Encounter (HOSPITAL_COMMUNITY): Payer: Self-pay | Admitting: Emergency Medicine

## 2021-05-23 ENCOUNTER — Other Ambulatory Visit: Payer: Self-pay

## 2021-05-23 ENCOUNTER — Emergency Department (HOSPITAL_COMMUNITY)
Admission: EM | Admit: 2021-05-23 | Discharge: 2021-05-23 | Disposition: A | Discharge status: Home / Self Care | Payer: Self-pay | Attending: Student | Admitting: Student

## 2021-05-23 DIAGNOSIS — F419 Anxiety disorder, unspecified: Secondary | ICD-10-CM | POA: Insufficient documentation

## 2021-05-23 DIAGNOSIS — R079 Chest pain, unspecified: Secondary | ICD-10-CM | POA: Insufficient documentation

## 2021-05-23 DIAGNOSIS — Z5321 Procedure and treatment not carried out due to patient leaving prior to being seen by health care provider: Secondary | ICD-10-CM | POA: Insufficient documentation

## 2021-05-23 DIAGNOSIS — R0602 Shortness of breath: Secondary | ICD-10-CM | POA: Insufficient documentation

## 2021-05-23 NOTE — ED Triage Notes (Signed)
Patient reports upper back pain, left chest tightness and 'something in my chest.' Pt states she gets full quickly, has had decreased appetite. Fullness sensation.

## 2021-05-23 NOTE — ED Notes (Signed)
Pt has decided to go home due to wait times. Pt advised to wait and see the provider.

## 2021-05-23 NOTE — ED Provider Triage Note (Signed)
Emergency Medicine Provider Triage Evaluation Note  Marie Church , a 66 y.o. female  was evaluated in triage.  Pt complains of shortness of breath, chest pain and anxiety.  She was discharged yesterday from the [facility] urgency department for similar.  Reports things are not getting better.  Review of Systems    Physical Exam  BP (!) 146/117    Pulse 84    Temp 98.6 F (37 C) (Oral)    Resp 18    SpO2 95%  Gen:   Awake, no distress   Resp:  Normal effort  MSK:   Moves extremities without difficulty  Other:  Clear lung sounds, regular rate and rhythm  Medical Decision Making  Medically screening exam initiated at 7:54 PM.  Appropriate orders placed.  Marie Church was informed that the remainder of the evaluation will be completed by another provider, this initial triage assessment does not replace that evaluation, and the importance of remaining in the ED until their evaluation is complete.   Patient had blood work done yesterday as well as a CT of her chest.  I do not feel as though I need to repeat the CT scan however blood work was offered.  She agreed to a troponin and EKG however her family member is wondering if all of this is caused by acid reflux and if there is something over-the-counter that they may use.  Patient states "there is probably not been to be anything different in the labs or pictures today."  EKG and troponin ordered, patient and her family member are likely to leave   Marie Hammock, PA-C 05/23/21 1956

## 2021-05-26 ENCOUNTER — Ambulatory Visit
Admit: 2021-05-26 | Discharge: 2021-05-26 | Disposition: A | Discharge status: Home / Self Care | Payer: Self-pay | Attending: Radiation Oncology | Admitting: Radiation Oncology

## 2021-05-26 DIAGNOSIS — C50411 Malignant neoplasm of upper-outer quadrant of right female breast: Secondary | ICD-10-CM

## 2021-05-28 LAB — ACID FAST CULTURE WITH REFLEXED SENSITIVITIES (MYCOBACTERIA): Acid Fast Culture: NEGATIVE

## 2021-05-29 NOTE — Progress Notes (Signed)
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 She developed fatigue and anticipated skin changes in the treatment field. She was hospitalized with metabolic encephalopathy and acute respiratory failure with hypoxia and was admitted to the hospital between 03/30/21-04/15/21. Disposition at the conclusion of her visit was to meet with pulmonary medicine.   Impression/Plan: 1. 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 I was unable to reach the patient but left a voicemail and on the message, I  discussed that we would be happy to continue to follow her as needed, but she will also continue to follow up with Dr. Wende Mott in [facility] in medical oncology. She was counseled on skin care as well as measures to avoid sun exposure to this area.         Carola Rhine, PAC

## 2021-06-17 ENCOUNTER — Other Ambulatory Visit (HOSPITAL_COMMUNITY): Payer: Self-pay

## 2021-06-17 ENCOUNTER — Other Ambulatory Visit: Payer: Self-pay

## 2021-06-17 LAB — ACID FAST CULTURE WITH REFLEXED SENSITIVITIES (MYCOBACTERIA): Acid Fast Culture: NEGATIVE

## 2021-07-01 ENCOUNTER — Other Ambulatory Visit: Payer: Self-pay

## 2021-07-25 ENCOUNTER — Encounter (HOSPITAL_COMMUNITY): Payer: Self-pay | Admitting: Emergency Medicine

## 2021-07-25 ENCOUNTER — Emergency Department (HOSPITAL_COMMUNITY): Payer: Self-pay

## 2021-07-25 ENCOUNTER — Other Ambulatory Visit: Payer: Self-pay

## 2021-07-25 ENCOUNTER — Emergency Department (HOSPITAL_COMMUNITY)
Admission: EM | Admit: 2021-07-25 | Discharge: 2021-07-26 | Disposition: A | Discharge status: Home / Self Care | Payer: Self-pay | Attending: Emergency Medicine | Admitting: Emergency Medicine

## 2021-07-25 DIAGNOSIS — E119 Type 2 diabetes mellitus without complications: Secondary | ICD-10-CM | POA: Insufficient documentation

## 2021-07-25 DIAGNOSIS — Z79899 Other long term (current) drug therapy: Secondary | ICD-10-CM | POA: Insufficient documentation

## 2021-07-25 DIAGNOSIS — R0602 Shortness of breath: Secondary | ICD-10-CM | POA: Insufficient documentation

## 2021-07-25 DIAGNOSIS — D649 Anemia, unspecified: Secondary | ICD-10-CM | POA: Insufficient documentation

## 2021-07-25 DIAGNOSIS — I10 Essential (primary) hypertension: Secondary | ICD-10-CM | POA: Insufficient documentation

## 2021-07-25 DIAGNOSIS — R000 Tachycardia, unspecified: Secondary | ICD-10-CM | POA: Insufficient documentation

## 2021-07-25 DIAGNOSIS — C50919 Malignant neoplasm of unspecified site of unspecified female breast: Secondary | ICD-10-CM | POA: Insufficient documentation

## 2021-07-25 LAB — BASIC METABOLIC PANEL
Anion gap: 5 (ref 5–15)
BUN: 15 mg/dL (ref 8–23)
CO2: 26 mmol/L (ref 22–32)
Calcium: 9.1 mg/dL (ref 8.9–10.3)
Chloride: 103 mmol/L (ref 98–111)
Creatinine, Ser: 0.61 mg/dL (ref 0.44–1.00)
GFR, Estimated: >60 mL/min (ref >60)
Glucose, Bld: 98 mg/dL (ref 70–99)
Potassium: 3.6 mmol/L (ref 3.5–5.1)
Sodium: 134 mmol/L — ABNORMAL LOW (ref 135–145)

## 2021-07-25 LAB — CBC WITH DIFFERENTIAL/PLATELET
Abs Immature Granulocytes: 0 10*3/uL (ref 0.00–0.07)
Basophils Absolute: 0 10*3/uL (ref 0.0–0.1)
Basophils Relative: 0 %
Eosinophils Absolute: 0.1 10*3/uL (ref 0.0–0.5)
Eosinophils Relative: 3 %
HCT: 32.6 % — ABNORMAL LOW (ref 36.0–46.0)
Hemoglobin: 10.2 g/dL — ABNORMAL LOW (ref 12.0–15.0)
Immature Granulocytes: 0 %
Lymphocytes Relative: 39 %
Lymphs Abs: 1.5 10*3/uL (ref 0.7–4.0)
MCH: 26.2 pg (ref 26.0–34.0)
MCHC: 31.3 g/dL (ref 30.0–36.0)
MCV: 83.6 fL (ref 80.0–100.0)
Monocytes Absolute: 0.3 10*3/uL (ref 0.1–1.0)
Monocytes Relative: 8 %
Neutro Abs: 1.9 10*3/uL (ref 1.7–7.7)
Neutrophils Relative %: 50 %
Platelets: 63 10*3/uL — ABNORMAL LOW (ref 150–400)
RBC: 3.9 MIL/uL (ref 3.87–5.11)
RDW: 19 % — ABNORMAL HIGH (ref 11.5–15.5)
WBC: 3.9 10*3/uL — ABNORMAL LOW (ref 4.0–10.5)
nRBC: 0 % (ref 0.0–0.2)

## 2021-07-25 LAB — URINALYSIS, ROUTINE W REFLEX MICROSCOPIC
Bacteria, UA: NONE SEEN
Bilirubin Urine: NEGATIVE
Glucose, UA: >=500 mg/dL — AB
Hgb urine dipstick: NEGATIVE
Ketones, ur: NEGATIVE mg/dL
Leukocytes,Ua: NEGATIVE
Nitrite: NEGATIVE
Protein, ur: NEGATIVE mg/dL
Specific Gravity, Urine: 1.016 (ref 1.005–1.030)
pH: 6 (ref 5.0–8.0)

## 2021-07-25 LAB — TROPONIN I (HIGH SENSITIVITY): Troponin I (High Sensitivity): 3 ng/L (ref <18)

## 2021-07-25 LAB — D-DIMER, QUANTITATIVE: D-Dimer, Quant: 5.28 ug/mL-FEU — ABNORMAL HIGH (ref 0.00–0.50)

## 2021-07-25 MED ORDER — ALUM & MAG HYDROXIDE-SIMETH 200-200-20 MG/5ML PO SUSP
30.0000 mL | Freq: Once | ORAL | Status: AC
  Administered 2021-07-25: 30 mL via ORAL
  Filled 2021-07-25: qty 30

## 2021-07-25 MED ORDER — LIDOCAINE VISCOUS HCL 2 % MT SOLN
15.0000 mL | Freq: Once | OROMUCOSAL | Status: AC
  Administered 2021-07-25: 15 mL via ORAL
  Filled 2021-07-25: qty 15

## 2021-07-25 NOTE — ED Triage Notes (Signed)
BIB EMS from home c/o shob and anxiousness since this AM.  Patient also c/o fatigue. Current breast CA patient.  History of low hgb, taking iron pills on her own.  ? ?100 HR  ?99% RA ?136/84 ?113 CBG ?18 RR ?

## 2021-07-25 NOTE — ED Provider Notes (Cosign Needed)
[facility] DEPT Provider Note   CSN: [id number] Arrival date & time: 07/25/21  2020     History  Chief Complaint  Patient presents with   Shortness of Breath   Fatigue    Marie Church is a 66 y.o. female with medical history of breast cancer, DM 2, hypertension.  Patient states that beginning this morning she was experiencing shortness of breath, weakness, anxiety.  Patient states that the shortness of breath worsened tonight which prompted her to present to ED for evaluation.  Patient denies any history of the same.  Patient states that she is currently undergoing chemotherapy for breast cancer, was noted to have low hemoglobin and placed herself on iron pills in the last month.  Denied the patient is endorsing shortness of breath, weakness, anxiety, congestion.  Patient denies chest pain, lightheadedness, dizziness, abdominal pain, nausea, vomiting.   Shortness of Breath Associated symptoms: no abdominal pain, no chest pain, no fever and no vomiting       Home Medications Prior to Admission medications   Medication Sig Start Date End Date Taking? Authorizing Provider  acetaminophen (TYLENOL) 500 MG tablet Take 1,000 mg by mouth every 6 (six) hours as needed for moderate pain or headache.    [provider]  carvedilol (COREG) 12.5 MG tablet Take 1 tablet (12.5 mg total) by mouth 2 (two) times daily with a meal. 04/15/21   Shelly Coss, MD  Dexlansoprazole 30 MG capsule Take 30 mg by mouth daily as needed (for acid reflux).    [provider]  furosemide (LASIX) 40 MG tablet Take 0.5 tablets (20 mg total) by mouth daily. 01/08/21   Larey Dresser, MD  guaiFENesin-dextromethorphan [facility] DM) 100-10 MG/5ML syrup Take 5 mLs by mouth every 4 (four) hours as needed for cough. 04/15/21   Shelly Coss, MD  mirtazapine (REMERON) 7.5 MG tablet Take 1 tablet (7.5 mg total) by mouth at bedtime. Patient not taking: Reported on  05/22/2021 04/15/21   Shelly Coss, MD  potassium chloride SA (KLOR-CON M) 20 MEQ tablet Take 1 tablet (20 mEq total) by mouth daily. Patient not taking: Reported on 05/22/2021 04/15/21   Shelly Coss, MD  sacubitril-valsartan (ENTRESTO) 24-26 MG Take 1 tablet by mouth 2 (two) times daily. 10/28/20   Almyra Deforest, PA  spironolactone (ALDACTONE) 25 MG tablet Take 0.5 tablets (12.5 mg total) by mouth daily. Patient not taking: Reported on 05/22/2021 11/07/20   Croitoru, Dani Gobble, MD      Allergies    Doxycycline, Amoxicillin-pot clavulanate, and Lisinopril    Review of Systems   Review of Systems  Constitutional:  Negative for fever.  HENT:  Positive for congestion.   Respiratory:  Positive for shortness of breath.   Cardiovascular:  Negative for chest pain.  Gastrointestinal:  Negative for abdominal pain, nausea and vomiting.  Neurological:  Positive for weakness. Negative for dizziness and light-headedness.  Psychiatric/Behavioral:  The patient is nervous/anxious.   All other systems reviewed and are negative.  Physical Exam Updated Vital Signs BP (!) 160/96    Pulse 100    Temp 98.9 F (37.2 C) (Oral)    Resp (!) 22    Ht '5\' 6"'$  (1.676 m)    Wt 72.6 kg    SpO2 99%    BMI 25.82 kg/m  Physical Exam Vitals and nursing note reviewed.  Constitutional:      General: She is not in acute distress.    Appearance: She is well-developed. She is not  ill-appearing, toxic-appearing or diaphoretic.  HENT:     Head: Normocephalic and atraumatic.     Nose: Nose normal. No congestion.     Mouth/Throat:     Mouth: Mucous membranes are moist.     Pharynx: Oropharynx is clear.  Eyes:     Extraocular Movements: Extraocular movements intact.     Conjunctiva/sclera: Conjunctivae normal.     Pupils: Pupils are equal, round, and reactive to light.  Cardiovascular:     Rate and Rhythm: Normal rate and regular rhythm.  Pulmonary:     Effort: Pulmonary effort is normal. No tachypnea or accessory muscle  usage.     Breath sounds: Normal breath sounds. No stridor. No decreased breath sounds, wheezing, rhonchi or rales.  Chest:     Chest wall: No tenderness.  Abdominal:     General: Bowel sounds are normal.     Palpations: Abdomen is soft. There is no hepatomegaly, splenomegaly or mass.     Tenderness: There is no abdominal tenderness.  Musculoskeletal:     Cervical back: Normal range of motion and neck supple.     Right lower leg: No tenderness. No edema.     Left lower leg: No tenderness. No edema.  Skin:    General: Skin is warm and dry.     Capillary Refill: Capillary refill takes less than 2 seconds.  Neurological:     Mental Status: She is alert and oriented to person, place, and time.    ED Results / Procedures / Treatments   Labs (all labs ordered are listed, but only abnormal results are displayed) Labs Reviewed  URINALYSIS, ROUTINE W REFLEX MICROSCOPIC - Abnormal; Notable for the following components:      Result Value   Glucose, UA >=500 (*)    All other components within normal limits  BASIC METABOLIC PANEL - Abnormal; Notable for the following components:   Sodium 134 (*)    All other components within normal limits  CBC WITH DIFFERENTIAL/PLATELET - Abnormal; Notable for the following components:   WBC 3.9 (*)    Hemoglobin 10.2 (*)    HCT 32.6 (*)    RDW 19.0 (*)    Platelets 63 (*)    All other components within normal limits  D-DIMER, QUANTITATIVE - Abnormal; Notable for the following components:   D-Dimer, Quant 5.28 (*)    All other components within normal limits  TROPONIN I (HIGH SENSITIVITY)    EKG EKG Interpretation  Date/Time:  Friday July 25 2021 20:33:55 EST Ventricular Rate:  106 PR Interval:  152 QRS Duration: 85 QT Interval:  337 QTC Calculation: 448 R Axis:   45 Text Interpretation: Sinus tachycardia  nonspecific T waves similar to Jan 2023 Confirmed by Sherwood Gambler [phone]) on 07/25/2021 10:21:34 PM  Radiology DG Chest 2  View  Result Date: 07/25/2021 CLINICAL DATA:  Shortness of breath. Fatigue. Anemia. Breast carcinoma. EXAM: CHEST - 2 VIEW COMPARISON:  05/21/2021 FINDINGS: The heart size and mediastinal contours are within normal limits. Left-sided Port-A-Cath remains in appropriate position. There has been near complete resolution of right upper lobe infiltrate since previous study. No new or worsening areas of pulmonary opacity are seen. No evidence of pleural effusion. IMPRESSION: Near complete resolution of right upper lobe infiltrate. No acute findings. Electronically Signed   By: Marlaine Hind M.D.   On: 07/25/2021 21:17    Procedures Procedures    Medications Ordered in ED Medications  alum & mag hydroxide-simeth (MAALOX/MYLANTA) 200-200-20 MG/5ML suspension 30 mL (  30 mLs Oral Given 07/25/21 2240)    And  lidocaine (XYLOCAINE) 2 % viscous mouth solution 15 mL (15 mLs Oral Given 07/25/21 2240)    ED Course/ Medical Decision Making/ A&P                           Medical Decision Making Amount and/or Complexity of Data Reviewed Labs: ordered. Radiology: ordered.   66 year old female presents ED for evaluation of shortness of breath.  Please see HPI for further details. On examination, the patient is afebrile, nontachycardic, nonhypoxic with oxygen saturation of 99% on room air, clear lung sounds bilaterally, soft compressible abdomen.  Patient nontoxic in appearance.  Patient is alert and oriented x4, sitting upright under her own power.  Patient worked up utilizing the following labs and imaging studies interpreted by me personally: - BMP shows slightly decreased sodium 134, noncontributory to symptoms - CBC shows decrease of blood cell count of 3.9, decreased hemoglobin at 10.2.  This is in line with patient baseline. - UA shows greater than 500 glucose.  Unsure why this was ordered.  Patient denies any dysuria, vaginal discharge. - Troponin 3 - D-dimer elevated at 5.28 - Chest x-ray does not  show any signs of consolidation, effusion, mediastinal widening - CT angiogram pending  Patient had elevated D-dimer at 5.28, CT angiogram ordered.  Patient given Xylocaine as well as Maalox/Mylanta due to feeling of "congestion" patient has in her chest.  Patient denies chest pain vehemently.  Patient denies any history of heartburn however she believes that this will help her symptoms.  At the end of my shift, patient CTA had not been conducted. Patient signed out to my colleague Lorre Munroe PA-C for further disposition and management. Patient stable at time of hand-off.    Final Clinical Impression(s) / ED Diagnoses Final diagnoses:  Shortness of breath    Rx / DC Orders ED Discharge Orders     None         Azucena Cecil, PA-C 07/25/21 2344

## 2021-07-25 NOTE — ED Notes (Signed)
Patient to xray.

## 2021-07-26 ENCOUNTER — Emergency Department (HOSPITAL_COMMUNITY): Payer: Self-pay

## 2021-07-26 ENCOUNTER — Encounter (HOSPITAL_COMMUNITY): Payer: Self-pay

## 2021-07-26 IMAGING — CT CT ANGIO CHEST
2 of 6 series · 16 of 36 positions shown · IV contrast (agent unspecified)
Comparison: PA and lateral chest today, chest CT with contrast
[DATE], chest CT without contrast [DATE]

CLINICAL DATA: Shortness of breath. Increased anxiety. Breast
cancer patient undergoing chemotherapy.

EXAM:
CT ANGIOGRAPHY CHEST WITH CONTRAST
TECHNIQUE: Multidetector CT imaging of the chest was performed using the
standard protocol during bolus administration of intravenous
contrast. Multiplanar CT image reconstructions and MIPs were
obtained to evaluate the vascular anatomy.

[Series 5: thins · axial · 0.64mm/px · z∈[-199,+7]mm · 15 of 232 slices shown]
[im 13/232  lung]
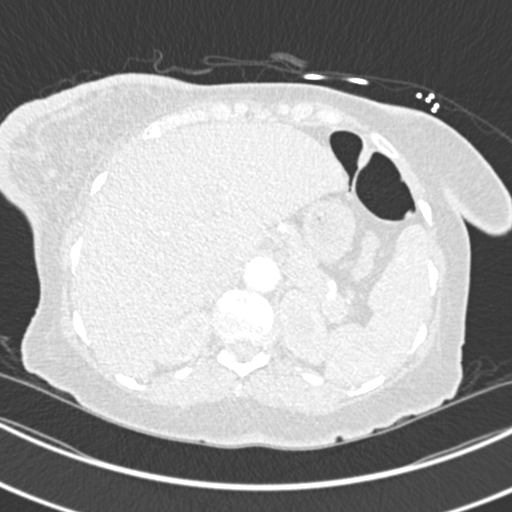
[im 26/232  mediastinal]
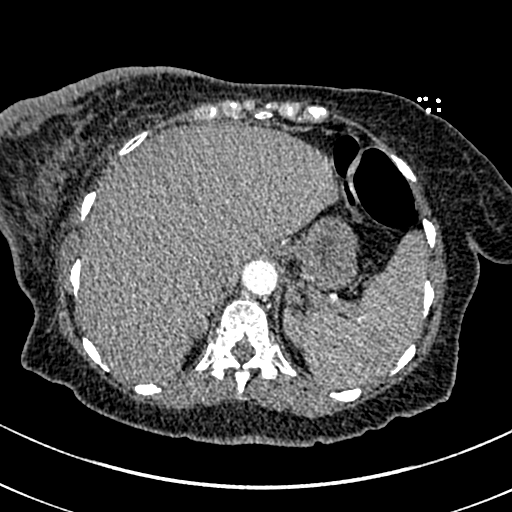
[im 39/232  lung]
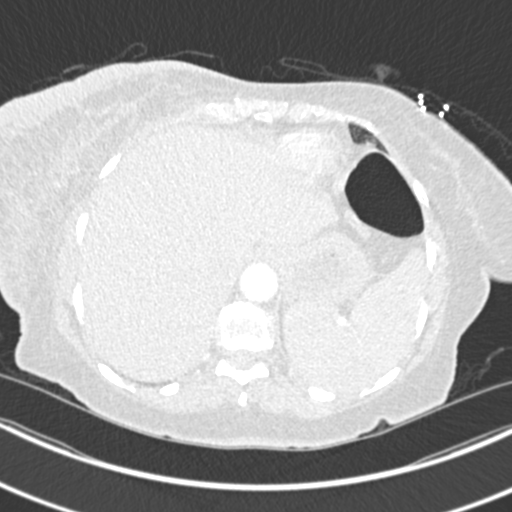
[im 52/232  mediastinal]
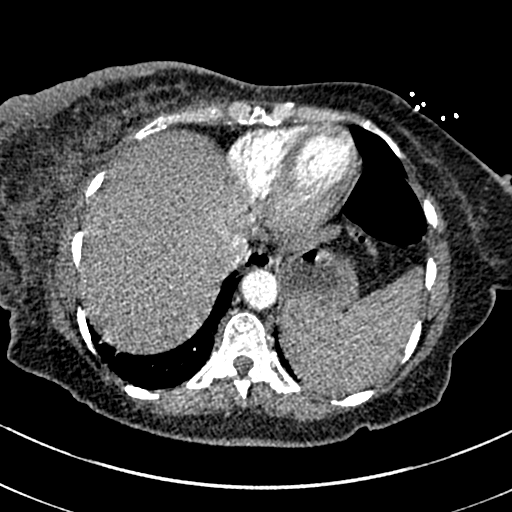
[im 78/232  lung]
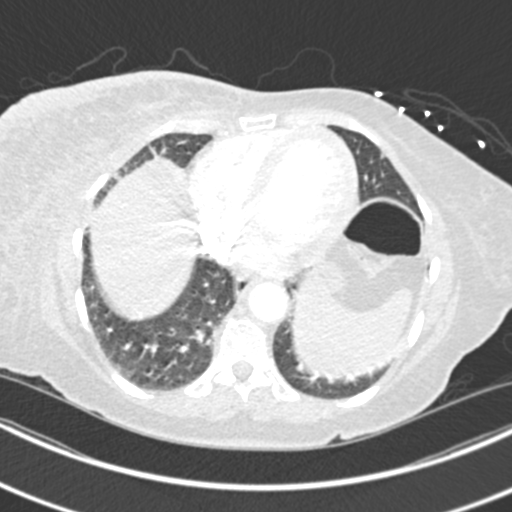
[im 90/232  mediastinal]
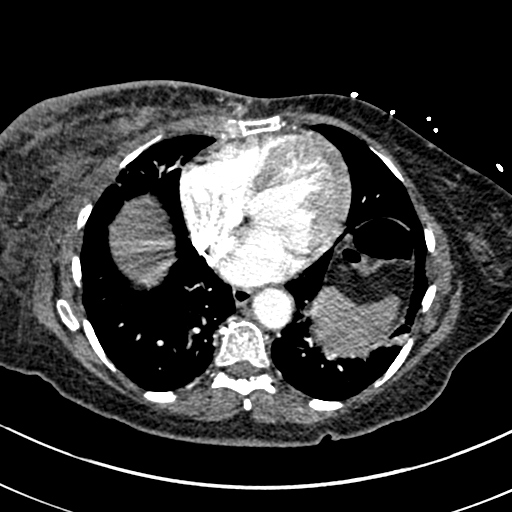
[im 103/232  lung]
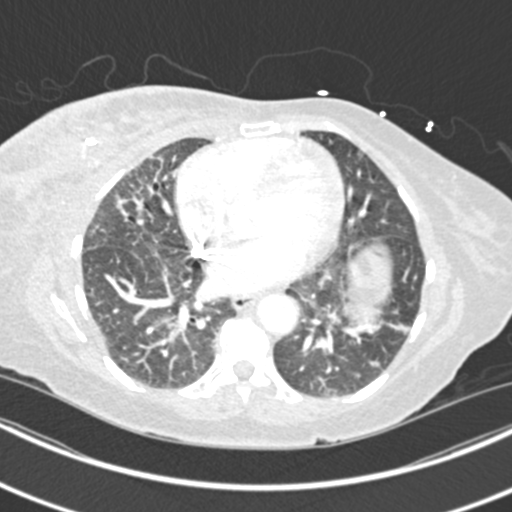
[im 116/232  mediastinal]
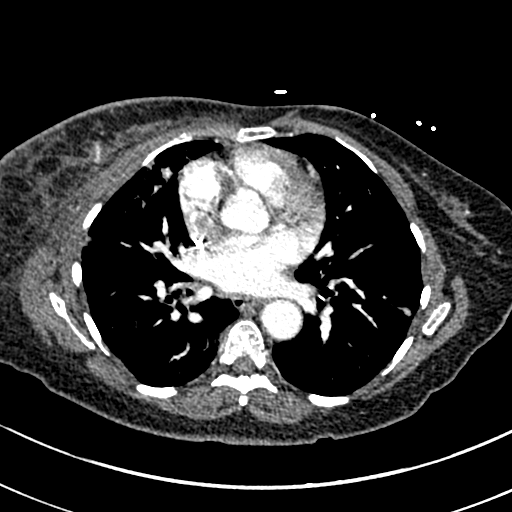
[im 129/232  lung]
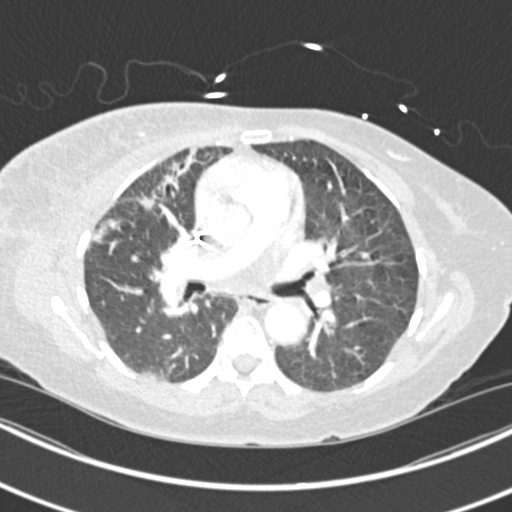
[im 142/232  mediastinal]
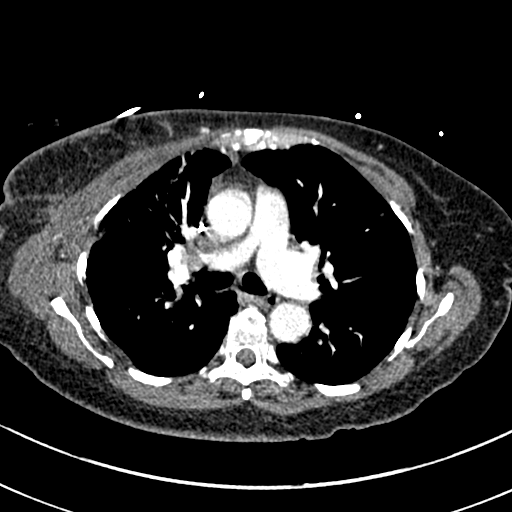
[im 155/232  lung]
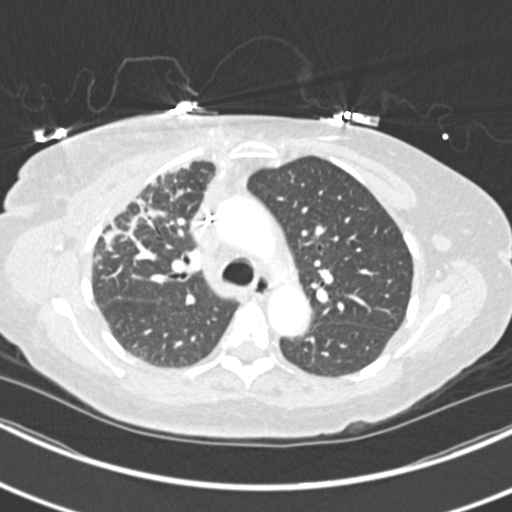
[im 180/232  mediastinal]
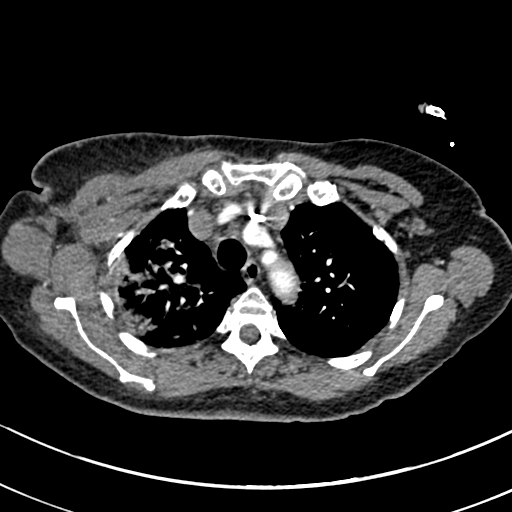
[im 193/232  lung]
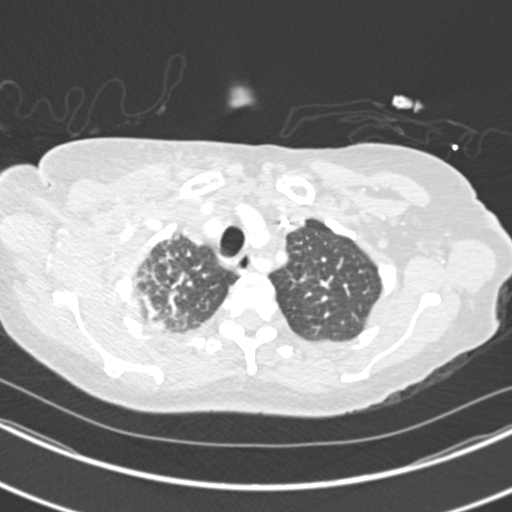
[im 206/232  mediastinal]
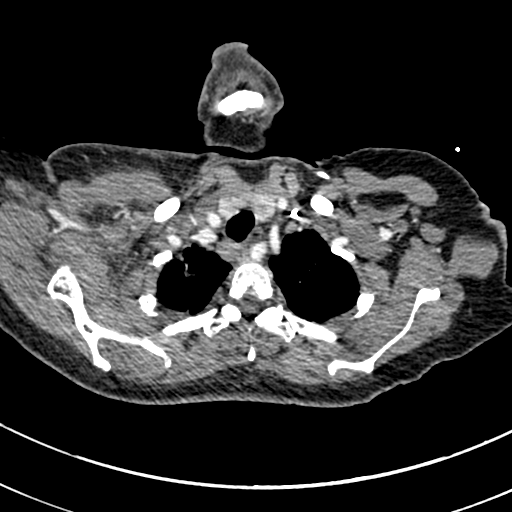
[im 219/232  lung]
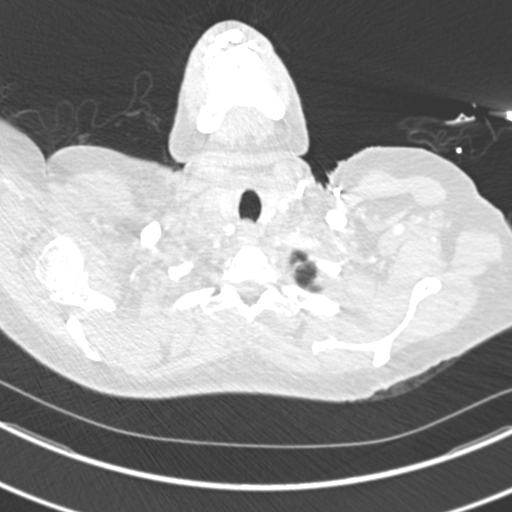

[Series 7: coronal mpr · coronal · 0.52mm/px · 1 of 130 slices shown]
[im 65/130  mediastinal]
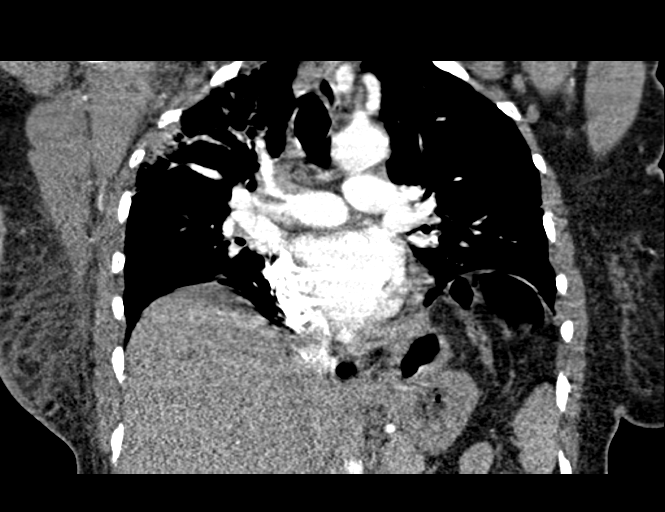

[16 of 36 positions shown; findings below may reference images not displayed]

RADIATION DOSE REDUCTION: This exam was performed according to the
departmental dose-optimization program which includes automated
exposure control, adjustment of the mA and/or kV according to
patient size and/or use of iterative reconstruction technique.

CONTRAST:  100mL OMNIPAQUE IOHEXOL 350 MG/ML SOLN
FINDINGS: Cardiovascular: Pulmonary arteries are normal in caliber without
visible embolic filling defect. The cardiac size is normal. There is
no pericardial effusion. Stable appearance upper left chest wall
port, IJ approach catheter.

Mild aortic atherosclerosis without aneurysm or dissection. The
pulmonary veins, great vessels are unremarkable.

Mediastinum/Nodes: There are stable mildly prominent left axillary
nodes up to 11 mm in short axis. There is no new or enlarging
axillary adenopathy. Right axillary nodal dissection has been seen
previously.

There are stable borderline prominent bilateral hilar lymph nodes up
to 9 mm in short axis.

There are similar sized right paratracheal, precarinal and
subcarinal mediastinal lymph nodes which were also seen previously.

1.4 cm heterogeneous nodule in the lower pole of the left lobe of
the thyroid is redemonstrated unchanged. The right lobe is
unremarkable. The trachea is clear. No esophageal thickening or
hiatal hernia.

Lungs/Pleura: There is increased patchy airspace disease in the
apicolateral right upper lobe, merging with increased coarsely
nodular interspersed with coarse reticulated subpleural opacities in
the anterolateral right upper lobe.

Similar coarsely reticulated but unchanged opacities are again noted
in the right middle lobe. Again the right middle lobe disease is
associated with bronchiectasis and probably reflects post pneumonic
scarring but the right upper lobe disease could be a combination of
acute and chronic disease.

Additional small new airspace disease is noted laterally in the
right middle lobe over several slices.

There is a 5.5 mm left upper lobe nodule anterolaterally on series 6
axial 46 which was previously 5 mm. A subpleural right lower lobe
nodule today measures 4.5 mm on axial 68 and previously measured 5
mm.

Linear scarring or atelectasis is seen in the left lower lobe
lateral base in an area of previous small consolidation.

Remaining lungs clear with no pleural effusion, thickening or
pneumothorax. No other visible nodule.

Upper Abdomen: No acute abnormality is seen. Liver is mildly
steatotic. Visualized portion without obvious mass allowing for
breathing motion.

Musculoskeletal: Right breast has become diffusely edematous since
the last CT with diffuse increased skin thickening. The thoracic
cage is intact.

Review of the MIP images confirms the above findings.
IMPRESSION: 1. No acute chest CTA abnormality.
2. Stable mildly prominent left axillary nodes and stable borderline
intrathoracic nodes.
3. Increased edema and skin thickening throughout the right breast,
could be due to inflammatory carcinoma with invasion of dermal
lymphatics, radiation changes, or mastitis of other etiology. A
vascular outflow etiology is not entirely excluded.
4. Increased interstitial combined with airspace infiltrates in the
right upper lobe and could be due to pneumonia and/or radiation
changes, with coarse right middle lobe interstitial changes which
are probably chronic given associated bronchiectasis and are not
significantly changed from last CT.
[DATE] nodule in each lung, slightly larger on the left and slightly
smaller on the right. Three-month follow-up CT recommended or PET-CT
with attention to the axillary adenopathy and left breast.

## 2021-07-26 MED ORDER — IOHEXOL 350 MG/ML SOLN
100.0000 mL | Freq: Once | INTRAVENOUS | Status: AC | PRN
  Administered 2021-07-26: 100 mL via INTRAVENOUS

## 2021-07-26 MED ORDER — AZITHROMYCIN 200 MG/5ML PO SUSR: 250.0000 mg | Freq: Every day | ORAL | 0 refills | Status: AC

## 2021-07-26 MED ORDER — AZITHROMYCIN 200 MG/5ML PO SUSR
500.0000 mg | Freq: Once | ORAL | Status: AC
  Administered 2021-07-26: 500 mg via ORAL
  Filled 2021-07-26: qty 12.5

## 2021-07-26 NOTE — ED Provider Notes (Signed)
CT negative for PE. ? ?Afebrile. No leukocytosis.  I doubt pneumonia, but will give a course of azithromycin and advise her to follow-up with pulm. ? ? ?  ?Montine Circle, PA-C ?07/26/21 0151 ? ?  ?Shanon Rosser, MD ?07/26/21 [phone] ? ?

## 2021-07-26 NOTE — Discharge Instructions (Signed)
I recommend that you follow-up with your regular doctor.  You may also benefit from being seen by pulmonology.  There was no evidence of blood clot on your CT scan.  There was some edema and infiltrate, which could be residual infection or chronic changes.  I recommend that you discuss this with your doctor.  I have started you on some antibiotic in case of infection. ?

## 2021-07-26 NOTE — ED Notes (Signed)
Patient back from CT.

## 2021-07-30 ENCOUNTER — Other Ambulatory Visit: Payer: Self-pay

## 2022-03-03 ENCOUNTER — Encounter (HOSPITAL_BASED_OUTPATIENT_CLINIC_OR_DEPARTMENT_OTHER): Payer: Self-pay | Admitting: Emergency Medicine

## 2022-03-03 ENCOUNTER — Emergency Department (HOSPITAL_BASED_OUTPATIENT_CLINIC_OR_DEPARTMENT_OTHER): Payer: Self-pay

## 2022-03-03 ENCOUNTER — Emergency Department (HOSPITAL_BASED_OUTPATIENT_CLINIC_OR_DEPARTMENT_OTHER)
Admission: EM | Admit: 2022-03-03 | Discharge: 2022-03-04 | Disposition: A | Discharge status: Home / Self Care | Payer: Self-pay | Attending: Emergency Medicine | Admitting: Emergency Medicine

## 2022-03-03 ENCOUNTER — Other Ambulatory Visit: Payer: Self-pay

## 2022-03-03 DIAGNOSIS — J069 Acute upper respiratory infection, unspecified: Secondary | ICD-10-CM | POA: Insufficient documentation

## 2022-03-03 DIAGNOSIS — Z79899 Other long term (current) drug therapy: Secondary | ICD-10-CM | POA: Insufficient documentation

## 2022-03-03 DIAGNOSIS — E119 Type 2 diabetes mellitus without complications: Secondary | ICD-10-CM | POA: Insufficient documentation

## 2022-03-03 DIAGNOSIS — I10 Essential (primary) hypertension: Secondary | ICD-10-CM | POA: Insufficient documentation

## 2022-03-03 DIAGNOSIS — R509 Fever, unspecified: Secondary | ICD-10-CM

## 2022-03-03 DIAGNOSIS — D649 Anemia, unspecified: Secondary | ICD-10-CM | POA: Insufficient documentation

## 2022-03-03 DIAGNOSIS — Z20822 Contact with and (suspected) exposure to covid-19: Secondary | ICD-10-CM | POA: Insufficient documentation

## 2022-03-03 DIAGNOSIS — R000 Tachycardia, unspecified: Secondary | ICD-10-CM | POA: Insufficient documentation

## 2022-03-03 DIAGNOSIS — Z853 Personal history of malignant neoplasm of breast: Secondary | ICD-10-CM | POA: Insufficient documentation

## 2022-03-03 LAB — COMPREHENSIVE METABOLIC PANEL
ALT: 17 U/L (ref 0–44)
AST: 34 U/L (ref 15–41)
Albumin: 3.7 g/dL (ref 3.5–5.0)
Alkaline Phosphatase: 101 U/L (ref 38–126)
Anion gap: 7 (ref 5–15)
BUN: 13 mg/dL (ref 8–23)
CO2: 23 mmol/L (ref 22–32)
Calcium: 8.4 mg/dL — ABNORMAL LOW (ref 8.9–10.3)
Chloride: 100 mmol/L (ref 98–111)
Creatinine, Ser: 0.66 mg/dL (ref 0.44–1.00)
GFR, Estimated: >60 mL/min (ref >60)
Glucose, Bld: 175 mg/dL — ABNORMAL HIGH (ref 70–99)
Potassium: 3.6 mmol/L (ref 3.5–5.1)
Sodium: 130 mmol/L — ABNORMAL LOW (ref 135–145)
Total Bilirubin: 0.6 mg/dL (ref 0.3–1.2)
Total Protein: 9.9 g/dL — ABNORMAL HIGH (ref 6.5–8.1)

## 2022-03-03 LAB — PROTIME-INR
INR: 1.1 (ref 0.8–1.2)
Prothrombin Time: 14.3 seconds (ref 11.4–15.2)

## 2022-03-03 LAB — URINALYSIS, ROUTINE W REFLEX MICROSCOPIC
Bilirubin Urine: NEGATIVE
Glucose, UA: >=500 mg/dL — AB
Ketones, ur: NEGATIVE mg/dL
Leukocytes,Ua: NEGATIVE
Nitrite: NEGATIVE
Protein, ur: 100 mg/dL — AB
Specific Gravity, Urine: 1.015 (ref 1.005–1.030)
pH: 6 (ref 5.0–8.0)

## 2022-03-03 LAB — CBC WITH DIFFERENTIAL/PLATELET
Abs Immature Granulocytes: 0.04 10*3/uL (ref 0.00–0.07)
Basophils Absolute: 0 10*3/uL (ref 0.0–0.1)
Basophils Relative: 0 %
Eosinophils Absolute: 0.1 10*3/uL (ref 0.0–0.5)
Eosinophils Relative: 2 %
HCT: 33.4 % — ABNORMAL LOW (ref 36.0–46.0)
Hemoglobin: 10.5 g/dL — ABNORMAL LOW (ref 12.0–15.0)
Immature Granulocytes: 1 %
Lymphocytes Relative: 27 %
Lymphs Abs: 1.7 10*3/uL (ref 0.7–4.0)
MCH: 26.6 pg (ref 26.0–34.0)
MCHC: 31.4 g/dL (ref 30.0–36.0)
MCV: 84.6 fL (ref 80.0–100.0)
Monocytes Absolute: 0.3 10*3/uL (ref 0.1–1.0)
Monocytes Relative: 6 %
Neutro Abs: 4 10*3/uL (ref 1.7–7.7)
Neutrophils Relative %: 64 %
Platelets: 101 10*3/uL — ABNORMAL LOW (ref 150–400)
RBC: 3.95 MIL/uL (ref 3.87–5.11)
RDW: 16.7 % — ABNORMAL HIGH (ref 11.5–15.5)
WBC: 6.2 10*3/uL (ref 4.0–10.5)
nRBC: 0 % (ref 0.0–0.2)

## 2022-03-03 LAB — RESP PANEL BY RT-PCR (FLU A&B, COVID) ARPGX2
Influenza A by PCR: NEGATIVE
Influenza B by PCR: NEGATIVE
SARS Coronavirus 2 by RT PCR: NEGATIVE

## 2022-03-03 LAB — LACTIC ACID, PLASMA
Lactic Acid, Venous: 2.1 mmol/L (ref 0.5–1.9)
Lactic Acid, Venous: 1.3 mmol/L (ref 0.5–1.9)

## 2022-03-03 LAB — URINALYSIS, MICROSCOPIC (REFLEX): WBC, UA: NONE SEEN WBC/hpf (ref 0–5)

## 2022-03-03 LAB — APTT: aPTT: 30 seconds (ref 24–36)

## 2022-03-03 MED ORDER — SODIUM CHLORIDE 0.9 % IV SOLN: INTRAVENOUS | Status: DC | PRN

## 2022-03-03 MED ORDER — ACETAMINOPHEN 500 MG PO TABS
1000.0000 mg | ORAL_TABLET | Freq: Once | ORAL | Status: AC
  Administered 2022-03-03: 1000 mg via ORAL
  Filled 2022-03-03: qty 2

## 2022-03-03 MED ORDER — LACTATED RINGERS IV SOLN: INTRAVENOUS | Status: DC

## 2022-03-03 MED ORDER — VANCOMYCIN HCL IN DEXTROSE 1-5 GM/200ML-% IV SOLN: 1000.0000 mg | Freq: Once | INTRAVENOUS | Status: DC

## 2022-03-03 MED ORDER — METRONIDAZOLE 500 MG/100ML IV SOLN
INTRAVENOUS | Status: AC
  Filled 2022-03-03: qty 100

## 2022-03-03 MED ORDER — SODIUM CHLORIDE 0.9 % IV SOLN: 2.0000 g | Freq: Three times a day (TID) | INTRAVENOUS | Status: DC

## 2022-03-03 MED ORDER — VANCOMYCIN HCL IN DEXTROSE 1-5 GM/200ML-% IV SOLN
1000.0000 mg | Freq: Once | INTRAVENOUS | Status: AC
  Administered 2022-03-03: 1000 mg via INTRAVENOUS

## 2022-03-03 MED ORDER — LACTATED RINGERS IV BOLUS (SEPSIS)
1000.0000 mL | Freq: Once | INTRAVENOUS | Status: AC
  Administered 2022-03-03: 1000 mL via INTRAVENOUS

## 2022-03-03 MED ORDER — LACTATED RINGERS IV BOLUS (SEPSIS): 500.0000 mL | Freq: Once | INTRAVENOUS | Status: DC

## 2022-03-03 MED ORDER — VANCOMYCIN HCL 1750 MG/350ML IV SOLN
1750.0000 mg | INTRAVENOUS | Status: DC
  Filled 2022-03-03: qty 350

## 2022-03-03 MED ORDER — SODIUM CHLORIDE 0.9 % IV SOLN
2.0000 g | Freq: Once | INTRAVENOUS | Status: DC
  Administered 2022-03-03: 2 g via INTRAVENOUS
  Filled 2022-03-03: qty 12.5

## 2022-03-03 MED ORDER — METRONIDAZOLE 500 MG/100ML IV SOLN
500.0000 mg | Freq: Once | INTRAVENOUS | Status: AC
  Administered 2022-03-03: 500 mg via INTRAVENOUS
  Filled 2022-03-03: qty 100

## 2022-03-03 MED ORDER — VANCOMYCIN HCL IN DEXTROSE 1-5 GM/200ML-% IV SOLN
1000.0000 mg | Freq: Once | INTRAVENOUS | Status: DC
  Filled 2022-03-03: qty 200

## 2022-03-03 MED ORDER — ACETAMINOPHEN 325 MG PO TABS
650.0000 mg | ORAL_TABLET | Freq: Once | ORAL | Status: AC
  Administered 2022-03-03: 650 mg via ORAL
  Filled 2022-03-03: qty 2

## 2022-03-03 NOTE — Sepsis Progress Note (Signed)
Following per sepsis protocol   

## 2022-03-03 NOTE — ED Notes (Signed)
Pt returned from CT °

## 2022-03-03 NOTE — Progress Notes (Addendum)
Pharmacy Antibiotic Note  Marie Church is a 66 y.o. female admitted on 03/03/2022 with sepsis.  Pharmacy has been consulted for cefepime and vancomycin dosing. Patient is febrile to 100.9, current WBC is 6.2. Patient renal function is stable, Scr 0.66.   Plan: Start cefepime 2g every 8 hours.  Give IV Vancomycin '2000mg'$  x 1 for loading dose, followed by IV Vancomycin 1750 every 24 hours for eAUC 528.  Flagyl per MD.    Height: '5\' 6"'$  (167.6 cm) Weight: 77.1 kg (170 lb) IBW/kg (Calculated) : 59.3  Temp (24hrs), Avg:102 F (38.9 C), Min:100.9 F (38.3 C), Max:103 F (39.4 C)  Recent Labs  Lab 03/03/22 1830  WBC 6.2  CREATININE 0.66  LATICACIDVEN 2.1*    Estimated Creatinine Clearance: 74.5 mL/min (by C-G formula based on SCr of 0.66 mg/dL).    Allergies  Allergen Reactions   Doxycycline Rash   Amoxicillin-Pot Clavulanate Rash    Tolerates Rocephin   Lisinopril Rash    Thank you for allowing pharmacy to be a part of this patient's care.  Ventura Sellers 03/03/2022 8:15 PM

## 2022-03-03 NOTE — ED Notes (Signed)
Patient transported to CT 

## 2022-03-03 NOTE — ED Triage Notes (Signed)
Cough and congestion x 3 days , chest discomfort with coughing . Denies shortness of breath

## 2022-03-03 NOTE — ED Notes (Addendum)
Pt oxygen saturation 87-90% on RA. RRT to bedside. EDP notified.

## 2022-03-03 NOTE — Progress Notes (Signed)
Placed patient on 2 liter cannula due to patient's SPO2 of 87%.  Patient's SPO2 increased to 98%.  RT will continue to monitor.

## 2022-03-03 NOTE — ED Notes (Signed)
Lactic acid collected

## 2022-03-04 MED ORDER — AZITHROMYCIN 250 MG PO TABS: 250.0000 mg | ORAL_TABLET | Freq: Every day | ORAL | 0 refills | Status: DC

## 2022-03-04 MED ORDER — LEVOFLOXACIN 750 MG PO TABS: 750.0000 mg | ORAL_TABLET | Freq: Every day | ORAL | 0 refills | Status: DC

## 2022-03-04 MED ORDER — HEPARIN SOD (PORK) LOCK FLUSH 100 UNIT/ML IV SOLN
500.0000 [IU] | Freq: Once | INTRAVENOUS | Status: AC
  Administered 2022-03-04: 500 [IU]
  Filled 2022-03-04: qty 5

## 2022-03-04 NOTE — ED Provider Notes (Signed)
[facility] Provider Note   CSN: [id number] Arrival date & time: 03/03/22  1707     History  Chief Complaint  Patient presents with   Cough    Marie Church is a 66 y.o. female.  Patient              Friday started with cough and congestion.  Denies any fevers but certainly had a temp here to 103 F.  Heart rate was 133 blood pressure 157/93 oxygen on room air was 98%.  Respiratory rate was 20.  Patient states that she thought she was going to get better.  The cough just continued.  And she had chest discomfort with just the cough.  No nausea no vomiting.  Past medical history is significant for breast cancer followed every 3 months in [place] by oncology there.  Patient has not had any chemotherapy for 6 months.  Initially had a lumpectomy and then had radiation treatment and then chemotherapy.  Patient has a port in the left part of her anterior chest.  She also known to have type 2 diabetes and hypertension.       Home Medications Prior to Admission medications   Medication Sig Start Date End Date Taking? Authorizing Provider  azithromycin (ZITHROMAX) 250 MG tablet Take 1 tablet (250 mg total) by mouth daily. Take first 2 tablets together, then 1 every day until finished. 03/04/22  Yes Fredia Sorrow, MD  acetaminophen (TYLENOL) 500 MG tablet Take 1,000 mg by mouth every 6 (six) hours as needed for moderate pain or headache.    [provider]  carvedilol (COREG) 12.5 MG tablet Take 1 tablet (12.5 mg total) by mouth 2 (two) times daily with a meal. 04/15/21   Shelly Coss, MD  Dexlansoprazole 30 MG capsule Take 30 mg by mouth daily as needed (for acid reflux).    [provider]  furosemide (LASIX) 40 MG tablet Take 0.5 tablets (20 mg total) by mouth daily. 01/08/21   Larey Dresser, MD  guaiFENesin-dextromethorphan [facility] DM) 100-10 MG/5ML syrup Take 5 mLs by mouth every 4 (four) hours as needed for cough.  04/15/21   Shelly Coss, MD  mirtazapine (REMERON) 7.5 MG tablet Take 1 tablet (7.5 mg total) by mouth at bedtime. Patient not taking: Reported on 05/22/2021 04/15/21   Shelly Coss, MD  potassium chloride SA (KLOR-CON M) 20 MEQ tablet Take 1 tablet (20 mEq total) by mouth daily. Patient not taking: Reported on 05/22/2021 04/15/21   Shelly Coss, MD  sacubitril-valsartan (ENTRESTO) 24-26 MG Take 1 tablet by mouth 2 (two) times daily. 10/28/20   Almyra Deforest, PA  spironolactone (ALDACTONE) 25 MG tablet Take 0.5 tablets (12.5 mg total) by mouth daily. Patient not taking: Reported on 05/22/2021 11/07/20   Croitoru, Dani Gobble, MD      Allergies    Doxycycline, Amoxicillin-pot clavulanate, and Lisinopril    Review of Systems   Review of Systems  Constitutional:  Positive for fatigue and fever. Negative for chills.  HENT:  Positive for congestion. Negative for ear pain and sore throat.   Eyes:  Negative for pain and visual disturbance.  Respiratory:  Positive for cough. Negative for shortness of breath.   Cardiovascular:  Positive for chest pain. Negative for palpitations.  Gastrointestinal:  Negative for abdominal pain and vomiting.  Genitourinary:  Negative for dysuria and hematuria.  Musculoskeletal:  Negative for arthralgias and back pain.  Skin:  Negative for color change and rash.  Neurological:  Negative  for seizures and syncope.  All other systems reviewed and are negative.   Physical Exam Updated Vital Signs BP (!) 168/97   Pulse (!) 104   Temp 99.8 F (37.7 C) (Oral)   Resp 20   Ht 1.676 m ('5\' 6"'$ )   Wt 77.1 kg   SpO2 100%   BMI 27.44 kg/m  Physical Exam Vitals and nursing note reviewed.  Constitutional:      General: She is not in acute distress.    Appearance: Normal appearance. She is well-developed. She is ill-appearing.  HENT:     Head: Normocephalic and atraumatic.     Mouth/Throat:     Mouth: Mucous membranes are moist.  Eyes:     Extraocular Movements:  Extraocular movements intact.     Conjunctiva/sclera: Conjunctivae normal.     Pupils: Pupils are equal, round, and reactive to light.  Cardiovascular:     Rate and Rhythm: Regular rhythm. Tachycardia present.     Heart sounds: No murmur heard. Pulmonary:     Effort: Pulmonary effort is normal. No respiratory distress.     Breath sounds: Normal breath sounds. No wheezing, rhonchi or rales.     Comments: Cath left anterior chest. Abdominal:     General: There is no distension.     Palpations: Abdomen is soft.     Tenderness: There is no abdominal tenderness. There is no guarding.  Musculoskeletal:        General: No swelling.     Cervical back: Normal range of motion and neck supple. No rigidity.     Right lower leg: No edema.     Left lower leg: No edema.  Skin:    General: Skin is warm and dry.     Capillary Refill: Capillary refill takes less than 2 seconds.  Neurological:     General: No focal deficit present.     Mental Status: She is alert and oriented to person, place, and time.  Psychiatric:        Mood and Affect: Mood normal.     ED Results / Procedures / Treatments   Labs (all labs ordered are listed, but only abnormal results are displayed) Labs Reviewed  LACTIC ACID, PLASMA - Abnormal; Notable for the following components:      Result Value   Lactic Acid, Venous 2.1 (*)    All other components within normal limits  COMPREHENSIVE METABOLIC PANEL - Abnormal; Notable for the following components:   Sodium 130 (*)    Glucose, Bld 175 (*)    Calcium 8.4 (*)    Total Protein 9.9 (*)    All other components within normal limits  CBC WITH DIFFERENTIAL/PLATELET - Abnormal; Notable for the following components:   Hemoglobin 10.5 (*)    HCT 33.4 (*)    RDW 16.7 (*)    Platelets 101 (*)    All other components within normal limits  URINALYSIS, ROUTINE W REFLEX MICROSCOPIC - Abnormal; Notable for the following components:   Glucose, UA >=500 (*)    Hgb urine  dipstick TRACE (*)    Protein, ur 100 (*)    All other components within normal limits  URINALYSIS, MICROSCOPIC (REFLEX) - Abnormal; Notable for the following components:   Bacteria, UA RARE (*)    All other components within normal limits  RESP PANEL BY RT-PCR (FLU A&B, COVID) ARPGX2  CULTURE, BLOOD (ROUTINE X 2)  CULTURE, BLOOD (ROUTINE X 2)  URINE CULTURE  LACTIC ACID, PLASMA  PROTIME-INR  APTT  EKG None  Radiology DG Chest Port 1 View  Result Date: 03/03/2022 CLINICAL DATA:  Cough, congestion and hypoxia. Question pulmonary edema. EXAM: PORTABLE CHEST 1 VIEW COMPARISON:  Chest CT without contrast earlier today at 8:15 p.m. FINDINGS: 10:46 p.m. Left IJ port catheter again terminates at about the superior cavoatrial junction. The cardiac size is normal. The mediastinum is normally outlined, with mild aortic atherosclerotic spread Asymmetric scarring change in the right apex is again noted. On CT, subpleural scar-like pleural-parenchymal thickening opacities and bronchiectasis extended inferiorly from the anterior right upper lobe down into the right middle lobe but this is not well seen radiographically. The lungs hypoexpanded and otherwise generally clear. No vascular congestion or edema are seen. IMPRESSION: Hypoinflated study with no new or acute radiographic abnormality since today's earlier chest CT. Asymmetric scarring on the right is redemonstrated. Heart size and vasculature are normal. Electronically Signed   By: Telford Nab M.D.   On: 03/03/2022 23:09   CT Chest Wo Contrast  Result Date: 03/03/2022 CLINICAL DATA:  Pneumonia; complication suspected; cough and congestion for 3 days EXAM: CT CHEST WITHOUT CONTRAST TECHNIQUE: Multidetector CT imaging of the chest was performed following the standard protocol without IV contrast. RADIATION DOSE REDUCTION: This exam was performed according to the departmental dose-optimization program which includes automated exposure control,  adjustment of the mA and/or kV according to patient size and/or use of iterative reconstruction technique. COMPARISON:  CT chest 07/26/2021 FINDINGS: Cardiovascular: Normal heart size. Trace pericardial effusion. Aortic atherosclerotic calcification. Left chest wall Port-A-Cath with tip at the superior cavoatrial junction. Mediastinum/Nodes: 1.2 cm left thyroid nodule does not require follow-up. No definite mediastinal adenopathy on noncontrast exam. Mildly enlarged left axillary lymph nodes measuring up to 1.3 cm in short axis. These are similar to slightly increased from 07/26/2021. Similar right paratracheal lymph node. Unremarkable esophagus. Lungs/Pleura: Bronchiectasis with architectural distortion and volume loss in the right upper lobe with associated coarse reticular opacities. These have slightly decreased from 07/26/2021 and may be related to post pneumonia scarring. Increased bronchiectasis/bronchiolectasis with architectural distortion and reticular opacities in the right middle lobe also likely related to post pneumonia scarring. No pleural effusion or pneumothorax. Diffuse bronchial wall thickening. There are a few small pulmonary nodules measuring up to 4 mm in the lingula (series 4/image 57). This previously measured 5 mm. Additional 4 mm nodule in the lateral right lower lobe (series 4/image 85) previously measured 4.5 mm. Upper Abdomen: No acute abnormality. Musculoskeletal: Edema within the right breast with diffuse skin thickening. Grossly similar to prior CT IMPRESSION: 1. No acute abnormality. 2. Decreased interstitial and airspace infiltrates compared with 07/26/2021 with increasing bronchiectasis/bronchiolectasis and architectural distortion in the right upper and middle lobes suggestive of post pneumonia scarring. 3. Diffuse bronchial wall thickening suggestive of airway infection/inflammation. 4. Similar to slightly increased left axillary lymph nodes and stable borderline intrathoracic  nodes. 5. Similar skin thickening and edema in the right breast. 6. Stable to decreased pulmonary nodules measuring up to 4 mm. Electronically Signed   By: Placido Sou M.D.   On: 03/03/2022 20:47   DG Chest 2 View  Result Date: 03/03/2022 CLINICAL DATA:  Cough and congestion x 3 days , chest discomfort with coughing . Denies shortness of breath.cough EXAM: CHEST - 2 VIEW COMPARISON:  Radiograph 07/25/2021 FINDINGS: 3 Port in the anterior chest wall with tip in distal SVC. Normal mediastinum and cardiac silhouette. Normal pulmonary vasculature. No evidence of effusion, infiltrate, or pneumothorax. No acute bony abnormality. IMPRESSION: No  acute cardiopulmonary process. Electronically Signed   By: Suzy Bouchard M.D.   On: 03/03/2022 18:27    Procedures Procedures    Medications Ordered in ED Medications  lactated ringers infusion (has no administration in time range)  lactated ringers bolus 1,000 mL (0 mLs Intravenous Stopped 03/03/22 2143)    And  lactated ringers bolus 1,000 mL (1,000 mLs Intravenous New Bag/Given 03/03/22 2147)    And  lactated ringers bolus 500 mL (0 mLs Intravenous Hold 03/03/22 2328)  vancomycin (VANCOCIN) IVPB 1000 mg/200 mL premix (has no administration in time range)    And  vancomycin (VANCOCIN) IVPB 1000 mg/200 mL premix (1,000 mg Intravenous New Bag/Given 03/03/22 2346)  vancomycin (VANCOREADY) IVPB 1750 mg/350 mL (has no administration in time range)  0.9 %  sodium chloride infusion ( Intravenous New Bag/Given 03/03/22 2053)  ceFEPIme (MAXIPIME) 2 g in sodium chloride 0.9 % 100 mL IVPB (has no administration in time range)  acetaminophen (TYLENOL) tablet 1,000 mg (1,000 mg Oral Given 03/03/22 1811)  acetaminophen (TYLENOL) tablet 650 mg (650 mg Oral Given 03/03/22 2142)  metroNIDAZOLE (FLAGYL) IVPB 500 mg (500 mg Intravenous New Bag/Given 03/03/22 2145)    ED Course/ Medical Decision Making/ A&P                           Medical Decision  Making Amount and/or Complexity of Data Reviewed Labs: ordered. Radiology: ordered. ECG/medicine tests: ordered.  Risk OTC drugs. Prescription drug management.   CRITICAL CARE Performed by: Fredia Sorrow Total critical care time: 45 minutes Critical care time was exclusive of separately billable procedures and treating other patients. Critical care was necessary to treat or prevent imminent or life-threatening deterioration. Critical care was time spent personally by me on the following activities: development of treatment plan with patient and/or surrogate as well as nursing, discussions with consultants, evaluation of patient's response to treatment, examination of patient, obtaining history from patient or surrogate, ordering and performing treatments and interventions, ordering and review of laboratory studies, ordering and review of radiographic studies, pulse oximetry and re-evaluation of patient's condition.  Patient's presentation on arrival with the fever tachycardia concerning for sepsis.  Patient started on sepsis protocol.  Patient given IV fluids.  And patient given broad-spectrum antibiotics and culture sent.  However during the work-up patient's lactic acid was originally 2.1 but came down to 1.3.  Urinalysis negative for urinary tract infection the complete metabolic panel sodium down at 130 glucose 175 BUN and creatinine normal for normal renal function and liver function tests were normal anion gap was normal CBC no leukocytosis.  White count 6.2 hemoglobin 10.5 platelets 101.  COVID influenza testing negative.  Chest x-ray hypoinflated study with no new or acute radiographic abnormalities.  Patient also had CT chest.  CT chest did not show any distinct infiltrates.  This had decreased interstitial and airspace infiltrates compared with 311 with increasing bronchiectasis and bronchial and architectural distortion in the right upper and middle lobes suggestive of post pneumonia  scarring.  Stable to decreased pulmonary nodules.  No pleural effusion no pneumothorax.  During her treatment here nurse got concerned that she was getting a little more short of breath so repeat chest x-ray was done.  No evidence of any pleural effusion.  There was not any on the CT scan either.  Patient was on 2 L of oxygen now off oxygen oxygen sats are remaining 95%.  Patient EKG was tach cardia.  Sinus  tach.  Overall patient now feels better.  Lactic acid is improved really do not have a white blood cell count we never had had any hypotension.  Patient oxygen sats are good.  COVID influenza negative as well.  This may very well be a viral upper respiratory infection.  Patient wants to go home.  No clear-cut indication for admission she knows she will get notified of her blood cultures are positive.  We will go ahead and send her home with a Z-Pak is clinically and still little concerned that there could be some pneumonia there although not distinctly identified on the CT of the chest or the chest x-rays.  Patient will return for any new or worse symptoms.  Final Clinical Impression(s) / ED Diagnoses Final diagnoses:  Viral upper respiratory tract infection  Fever, unspecified fever cause    Rx / DC Orders ED Discharge Orders          Ordered    levofloxacin (LEVAQUIN) 750 MG tablet  Daily,   Status:  Discontinued        03/04/22 0031    azithromycin (ZITHROMAX) 250 MG tablet  Daily        03/04/22 0033              Fredia Sorrow, MD 03/04/22 [phone]

## 2022-03-04 NOTE — Discharge Instructions (Signed)
Work-up here suggestive of probably of viral upper respiratory infection.  I have some concerns that she may have pneumonia developing.  So take the azithromycin as directed.  You got very broad-spectrum antibiotics IV here tonight.  No indications for admission.  Return for any new or worse symptoms.  Blood cultures were done you will be contacted if they grow anything abnormal.  COVID and influenza testing negative.

## 2022-03-05 LAB — URINE CULTURE

## 2022-03-08 LAB — CULTURE, BLOOD (ROUTINE X 2)
Culture: NO GROWTH
Culture: NO GROWTH
Special Requests: ADEQUATE
Special Requests: ADEQUATE

## 2022-03-11 ENCOUNTER — Emergency Department (HOSPITAL_COMMUNITY)
Admission: EM | Admit: 2022-03-11 | Discharge: 2022-03-12 | Disposition: A | Discharge status: Home / Self Care | Payer: Self-pay | Attending: Emergency Medicine | Admitting: Emergency Medicine

## 2022-03-11 ENCOUNTER — Emergency Department (HOSPITAL_COMMUNITY): Payer: Self-pay

## 2022-03-11 ENCOUNTER — Encounter (HOSPITAL_COMMUNITY): Payer: Self-pay

## 2022-03-11 ENCOUNTER — Other Ambulatory Visit: Payer: Self-pay

## 2022-03-11 DIAGNOSIS — I110 Hypertensive heart disease with heart failure: Secondary | ICD-10-CM | POA: Insufficient documentation

## 2022-03-11 DIAGNOSIS — I509 Heart failure, unspecified: Secondary | ICD-10-CM | POA: Insufficient documentation

## 2022-03-11 DIAGNOSIS — R059 Cough, unspecified: Secondary | ICD-10-CM | POA: Insufficient documentation

## 2022-03-11 DIAGNOSIS — Z853 Personal history of malignant neoplasm of breast: Secondary | ICD-10-CM | POA: Insufficient documentation

## 2022-03-11 DIAGNOSIS — Z79899 Other long term (current) drug therapy: Secondary | ICD-10-CM | POA: Insufficient documentation

## 2022-03-11 DIAGNOSIS — E119 Type 2 diabetes mellitus without complications: Secondary | ICD-10-CM | POA: Insufficient documentation

## 2022-03-11 DIAGNOSIS — M546 Pain in thoracic spine: Secondary | ICD-10-CM | POA: Insufficient documentation

## 2022-03-11 DIAGNOSIS — D649 Anemia, unspecified: Secondary | ICD-10-CM | POA: Insufficient documentation

## 2022-03-11 LAB — CBC WITH DIFFERENTIAL/PLATELET
Abs Immature Granulocytes: 0.06 10*3/uL (ref 0.00–0.07)
Basophils Absolute: 0 10*3/uL (ref 0.0–0.1)
Basophils Relative: 1 %
Eosinophils Absolute: 0.2 10*3/uL (ref 0.0–0.5)
Eosinophils Relative: 3 %
HCT: 35.9 % — ABNORMAL LOW (ref 36.0–46.0)
Hemoglobin: 10.7 g/dL — ABNORMAL LOW (ref 12.0–15.0)
Immature Granulocytes: 1 %
Lymphocytes Relative: 43 %
Lymphs Abs: 2.3 10*3/uL (ref 0.7–4.0)
MCH: 26.6 pg (ref 26.0–34.0)
MCHC: 29.8 g/dL — ABNORMAL LOW (ref 30.0–36.0)
MCV: 89.3 fL (ref 80.0–100.0)
Monocytes Absolute: 0.3 10*3/uL (ref 0.1–1.0)
Monocytes Relative: 6 %
Neutro Abs: 2.5 10*3/uL (ref 1.7–7.7)
Neutrophils Relative %: 46 %
Platelets: 153 10*3/uL (ref 150–400)
RBC: 4.02 MIL/uL (ref 3.87–5.11)
RDW: 16.6 % — ABNORMAL HIGH (ref 11.5–15.5)
WBC: 5.3 10*3/uL (ref 4.0–10.5)
nRBC: 0 % (ref 0.0–0.2)

## 2022-03-11 LAB — COMPREHENSIVE METABOLIC PANEL
ALT: 18 U/L (ref 0–44)
AST: 28 U/L (ref 15–41)
Albumin: 3.6 g/dL (ref 3.5–5.0)
Alkaline Phosphatase: 95 U/L (ref 38–126)
Anion gap: 6 (ref 5–15)
BUN: 21 mg/dL (ref 8–23)
CO2: 26 mmol/L (ref 22–32)
Calcium: 8.8 mg/dL — ABNORMAL LOW (ref 8.9–10.3)
Chloride: 104 mmol/L (ref 98–111)
Creatinine, Ser: 0.71 mg/dL (ref 0.44–1.00)
GFR, Estimated: >60 mL/min (ref >60)
Glucose, Bld: 136 mg/dL — ABNORMAL HIGH (ref 70–99)
Potassium: 3.8 mmol/L (ref 3.5–5.1)
Sodium: 136 mmol/L (ref 135–145)
Total Bilirubin: 0.3 mg/dL (ref 0.3–1.2)
Total Protein: 9.8 g/dL — ABNORMAL HIGH (ref 6.5–8.1)

## 2022-03-11 LAB — TROPONIN I (HIGH SENSITIVITY): Troponin I (High Sensitivity): 3 ng/L (ref <18)

## 2022-03-11 LAB — BRAIN NATRIURETIC PEPTIDE: B Natriuretic Peptide: 7.9 pg/mL (ref 0.0–100.0)

## 2022-03-11 LAB — LIPASE, BLOOD: Lipase: 59 U/L — ABNORMAL HIGH (ref 11–51)

## 2022-03-11 MED ORDER — IOHEXOL 350 MG/ML SOLN
100.0000 mL | Freq: Once | INTRAVENOUS | Status: AC | PRN
  Administered 2022-03-11: 100 mL via INTRAVENOUS

## 2022-03-11 NOTE — ED Triage Notes (Signed)
Pt to er, pt states that she was in the er last week, states that she was dx with a URI, states that she is still feeling weak and short of breath, states that she still has a little cough and also has some back pain.  States that she has a hx of breast cancer.  Pt states that she got treatment for breast cancer about 6 months ago.  Pt talking in full sentences, resps even and unlabored

## 2022-03-11 NOTE — ED Provider Notes (Signed)
[facility] DEPT Provider Note   CSN: [id number] Arrival date & time: 03/11/22  1504     History {Add pertinent medical, surgical, social history, OB history to HPI:1} Chief Complaint  Patient presents with   Shortness of Breath    JOCEE KISSICK is a 66 y.o. female who presents to the ER at this time with ongoing generalized fatigue, persistent intermittent dry cough and shortness of breath.  Patient states that she has back pain on the right for a few months, history of right breast surgery for malignancy.  She is status post surgery and chemotherapy last treatment 6 months ago.  She endorses improvement in shortness of breath since treatment in the ED On 02/22/2022, was treated with Levaquin and Z-Pak at that time..  Overall well-appearing denies chest pain.  I personally reviewed her medical records.  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  Patient's PCP is in [place] primarily is currently residing with her son and transfer.   HPI     Home Medications Prior to Admission medications   Medication Sig Start Date End Date Taking? Authorizing Provider  acetaminophen (TYLENOL) 500 MG tablet Take 1,000 mg by mouth every 6 (six) hours as needed for moderate pain or headache.    [provider]  azithromycin (ZITHROMAX) 250 MG tablet Take 1 tablet (250 mg total) by mouth daily. Take first 2 tablets together, then 1 every day until finished. 03/04/22   Fredia Sorrow, MD  carvedilol (COREG) 12.5 MG tablet Take 1 tablet (12.5 mg total) by mouth 2 (two) times daily with a meal. 04/15/21   Shelly Coss, MD  Dexlansoprazole 30 MG capsule Take 30 mg by mouth daily as needed (for acid reflux).    [provider]  furosemide (LASIX) 40 MG tablet Take 0.5 tablets (20 mg total) by mouth daily. 01/08/21   Larey Dresser, MD   guaiFENesin-dextromethorphan [facility] DM) 100-10 MG/5ML syrup Take 5 mLs by mouth every 4 (four) hours as needed for cough. 04/15/21   Shelly Coss, MD  mirtazapine (REMERON) 7.5 MG tablet Take 1 tablet (7.5 mg total) by mouth at bedtime. Patient not taking: Reported on 05/22/2021 04/15/21   Shelly Coss, MD  potassium chloride SA (KLOR-CON M) 20 MEQ tablet Take 1 tablet (20 mEq total) by mouth daily. Patient not taking: Reported on 05/22/2021 04/15/21   Shelly Coss, MD  sacubitril-valsartan (ENTRESTO) 24-26 MG Take 1 tablet by mouth 2 (two) times daily. 10/28/20   Almyra Deforest, PA  spironolactone (ALDACTONE) 25 MG tablet Take 0.5 tablets (12.5 mg total) by mouth daily. Patient not taking: Reported on 05/22/2021 11/07/20   Croitoru, Dani Gobble, MD      Allergies    Doxycycline, Amoxicillin-pot clavulanate, and Lisinopril    Review of Systems   Review of Systems  Constitutional:  Positive for fatigue. Negative for activity change, appetite change and fever.  HENT: Negative.    Respiratory:  Positive for cough and shortness of breath.   Cardiovascular: Negative.   Gastrointestinal: Negative.   Musculoskeletal:  Positive for back pain.    Physical Exam Updated Vital Signs BP (!) 148/79   Pulse 96   Temp 98.5 F (36.9 C) (Oral)   Resp 16   Ht '5\' 6"'$  (1.676 m)   Wt 77.1 kg   SpO2 96%   BMI 27.44 kg/m  Physical Exam Vitals and nursing note reviewed.  Constitutional:  Appearance: She is not ill-appearing or toxic-appearing.  HENT:     Head: Normocephalic and atraumatic.     Mouth/Throat:     Mouth: Mucous membranes are moist.     Pharynx: No oropharyngeal exudate or posterior oropharyngeal erythema.  Eyes:     General:        Right eye: No discharge.        Left eye: No discharge.     Conjunctiva/sclera: Conjunctivae normal.  Cardiovascular:     Rate and Rhythm: Normal rate and regular rhythm.     Pulses: Normal pulses.  Pulmonary:     Effort: Pulmonary effort is  normal. No tachypnea, accessory muscle usage or respiratory distress.     Breath sounds: Examination of the right-middle field reveals rales. Examination of the right-lower field reveals rales. Rales present. No wheezing.  Chest:     Chest wall: No mass, tenderness or edema.  Abdominal:     General: Bowel sounds are normal. There is no distension.     Palpations: Abdomen is soft.     Tenderness: There is no abdominal tenderness.  Musculoskeletal:        General: No deformity.     Cervical back: Neck supple.     Right lower leg: No tenderness. No edema.     Left lower leg: No tenderness. No edema.  Skin:    General: Skin is warm and dry.     Capillary Refill: Capillary refill takes less than 2 seconds.  Neurological:     General: No focal deficit present.     Mental Status: She is alert and oriented to person, place, and time. Mental status is at baseline.  Psychiatric:        Mood and Affect: Mood normal.     ED Results / Procedures / Treatments   Labs (all labs ordered are listed, but only abnormal results are displayed) Labs Reviewed  CBC WITH DIFFERENTIAL/PLATELET - Abnormal; Notable for the following components:      Result Value   Hemoglobin 10.7 (*)    HCT 35.9 (*)    MCHC 29.8 (*)    RDW 16.6 (*)    All other components within normal limits  COMPREHENSIVE METABOLIC PANEL - Abnormal; Notable for the following components:   Glucose, Bld 136 (*)    Calcium 8.8 (*)    Total Protein 9.8 (*)    All other components within normal limits  LIPASE, BLOOD - Abnormal; Notable for the following components:   Lipase 59 (*)    All other components within normal limits  BRAIN NATRIURETIC PEPTIDE  TROPONIN I (HIGH SENSITIVITY)  TROPONIN I (HIGH SENSITIVITY)    EKG EKG Interpretation  Date/Time:  Wednesday March 11 2022 15:39:38 EDT Ventricular Rate:  106 PR Interval:  141 QRS Duration: 79 QT Interval:  329 QTC Calculation: 437 R Axis:   46 Text Interpretation: Sinus  tachycardia Abnormal R-wave progression, early transition Consider left ventricular hypertrophy Confirmed by Lennice Sites ([id number]) on 03/11/2022 8:49:18 PM  Radiology CT Angio Chest PE W and/or Wo Contrast  Result Date: 03/11/2022 CLINICAL DATA:  Pulmonary embolism suspected, high probability. Weakness and shortness of breath. Chest pain radiating to back. Increased exertional dyspnea. EXAM: CT ANGIOGRAPHY CHEST WITH CONTRAST TECHNIQUE: Multidetector CT imaging of the chest was performed using the standard protocol during bolus administration of intravenous contrast. Multiplanar CT image reconstructions and MIPs were obtained to evaluate the vascular anatomy. RADIATION DOSE REDUCTION: This exam was performed according to the departmental  dose-optimization program which includes automated exposure control, adjustment of the mA and/or kV according to patient size and/or use of iterative reconstruction technique. CONTRAST:  168m OMNIPAQUE IOHEXOL 350 MG/ML SOLN COMPARISON:  03/03/2022. FINDINGS: Cardiovascular: The heart is enlarged and there is no pericardial effusion. There is mild atherosclerotic calcification of the aorta without evidence of aneurysm. The pulmonary trunk is normal in caliber. No definite evidence of pulmonary embolism. Evaluation of the segmental and subsegmental arteries is limited due to respiratory motion artifact. Mediastinum/Nodes: No mediastinal lymphadenopathy. Prominent lymph nodes are present at the hilar regions bilaterally. There are prominent lymph nodes in the left axilla measuring up to 9 mm in short axis diameter. Stable fat stranding is noted in the axillary region on the right. The thyroid gland, trachea, and esophagus are within normal limits. Lungs/Pleura: Stable bronchiectasis, architectural distortion with airspace disease is noted in the anterior aspect of the right upper and middle lobes with reduced lung volume. Mild atelectasis at the lung bases bilaterally. No  effusion or pneumothorax. There is a 4 mm nodule in the left upper lobe, axial image 50. There is a 3 mm nodule in the left upper lobe, axial image 48. There is a stable 4 mm nodule in the right lower lobe, axial image 86. Upper Abdomen: No acute abnormality. Musculoskeletal: Skin thickening and subcutaneous fat stranding in the right breast, possible edema. Mild degenerative changes are present in the thoracic spine. No acute or suspicious osseous abnormality. Review of the MIP images confirms the above findings. IMPRESSION: 1. No definite evidence of pulmonary embolism. Evaluation of the segmental and subsegmental arteries is limited due to respiratory motion artifact. 2. Bronchiectasis with interstitial and airspace opacities in the right upper and middle lobes, possible scarring or pneumonitis. 3. Stable pulmonary nodules bilaterally measuring up to 4 mm. No follow-up needed if patient is low-risk (and has no known or suspected primary neoplasm). Non-contrast chest CT can be considered in 12 months if patient is high-risk. This recommendation follows the consensus statement: Guidelines for Management of Incidental Pulmonary Nodules Detected on CT Images: From the Fleischner Society 2017; Radiology 2017; 284:228-243. 4. Stable prominent left axillary lymph nodes. 5. Aortic atherosclerosis. Electronically Signed   By: LBrett FairyM.D.   On: 03/11/2022 22:24   DG Chest 2 View  Result Date: 03/11/2022 CLINICAL DATA:  Cough and shortness of breath, chest pain radiating to the back. EXAM: CHEST - 2 VIEW COMPARISON:  Chest radiograph and CT 03/03/2022 FINDINGS: The left chest wall port is stable in position with tip terminating at the cavoatrial junction. The cardiomediastinal silhouette is stable and within normal limits There is no focal consolidation or pulmonary edema. There is no pleural effusion or pneumothorax. Right apical scarring is again seen. There is no acute osseous abnormality. Surgical clips are  noted in the right breast. IMPRESSION: Stable chest with no radiographic evidence of acute cardiopulmonary process. Electronically Signed   By: PValetta MoleM.D.   On: 03/11/2022 16:37    Procedures Procedures  {Document cardiac monitor, telemetry assessment procedure when appropriate:1}  Medications Ordered in ED Medications  iohexol (OMNIPAQUE) 350 MG/ML injection 100 mL (100 mLs Intravenous Contrast Given 03/11/22 2156)    ED Course/ Medical Decision Making/ A&P                           Medical Decision Making 66year old female who presents with concern for persistent cough, back pain.   HTN  tachycardic on intake. Cardiac exam is unremarkable, pulmonary exam with rales in the right lung base, dry cough intermittently on exam. VS normal on monitor at this time. Abdominal exam is benign.   DDX includes but is not limited to PE, pleural effusion, pneumonia, URI post chemotherapy and radiation treatment changes, MSK pain.   Amount and/or Complexity of Data Reviewed Labs:     Details: CBC without leukocytosis with anemia with hemoglobin of 10 went 7 near patient's baseline.  CMP unremarkable, BNP normal, lipase is negative, troponin is negative. Radiology:     Details:   Chest x-ray negative for acute cardiopulmonary disease, CT PE study, negative for PE, bronchiectasis with insterstitial and airspace opacities on the RULobe and middle lobe, ? scarring versus pneumonitis. Pulmonary nodules bilaterally up to 79m.  ECG/medicine tests:     Details: Sinus tachycardia on EKG, no STEMI.   ***  {Document critical care time when appropriate:1} {Document review of labs and clinical decision tools ie heart score, Chads2Vasc2 etc:1}  {Document your independent review of radiology images, and any outside records:1} {Document your discussion with family members, caretakers, and with consultants:1} {Document social determinants of health affecting pt's care:1} {Document your decision making  why or why not admission, treatments were needed:1} Final Clinical Impression(s) / ED Diagnoses Final diagnoses:  None    Rx / DC Orders ED Discharge Orders     None

## 2022-03-11 NOTE — ED Notes (Addendum)
PT walk to and from restroom 02 stat 97

## 2022-03-11 NOTE — ED Provider Triage Note (Signed)
Emergency Medicine Provider Triage Evaluation Note  SHAMIA UPPAL , a 66 y.o. female  was evaluated in triage.  Pt complains of weakness and sob. C/o pain in her chest pain radiates to back. She feels very sob.increased exertional dyspnea.seen opn 10/18 treated for CAP  Review of Systems  Positive: weakness Negative: fever  Physical Exam  BP (!) 172/101   Pulse (!) 113   Temp 98.8 F (37.1 C)   Resp 18   Ht '5\' 6"'$  (1.676 m)   Wt 77.1 kg   SpO2 95%   BMI 27.44 kg/m  Gen:   Awake, no distress   Resp:  Normal effort  MSK:   Moves extremities without difficulty  Other:  RLL abnormal breath sounds  Medical Decision Making  Medically screening exam initiated at 3:56 PM.  Appropriate orders placed.  SOLEI WUBBEN was informed that the remainder of the evaluation will be completed by another provider, this initial triage assessment does not replace that evaluation, and the importance of remaining in the ED until their evaluation is complete.   Work up [employer], [place], PA-C 03/11/22 1600

## 2022-03-11 NOTE — ED Notes (Signed)
Pt needs IV for CT  

## 2022-03-12 MED ORDER — LIDOCAINE 5 % EX PTCH
1.0000 | MEDICATED_PATCH | CUTANEOUS | Status: DC
  Administered 2022-03-12: 1 via TRANSDERMAL
  Filled 2022-03-12: qty 1

## 2022-03-12 MED ORDER — ACETAMINOPHEN 325 MG PO TABS
650.0000 mg | ORAL_TABLET | Freq: Once | ORAL | Status: AC
  Administered 2022-03-12: 650 mg via ORAL
  Filled 2022-03-12: qty 2

## 2022-03-12 NOTE — Discharge Instructions (Signed)
You were seen in the ER today for your cough and your back pain.  Your work-up was very reassuring including your CT scan.  You your cough is likely from a viral respiratory illness.  You can expect this to linger for a little bit longer.  Your back pain is likely related to muscle strain from the coughing you have been doing.  You may use topical pain relief such as Biofreeze, IcyHot, or lidocaine patches.  May also use over-the-counter medication such as Tylenol ibuprofen to treat your symptoms.  Follow-up with your primary care doctor and return to the ER with any new severe symptoms.

## 2022-06-06 ENCOUNTER — Emergency Department (HOSPITAL_BASED_OUTPATIENT_CLINIC_OR_DEPARTMENT_OTHER): Payer: BLUE CROSS/BLUE SHIELD

## 2022-06-06 ENCOUNTER — Emergency Department (HOSPITAL_BASED_OUTPATIENT_CLINIC_OR_DEPARTMENT_OTHER)
Admission: EM | Admit: 2022-06-06 | Discharge: 2022-06-07 | Disposition: A | Discharge status: Home / Self Care | Payer: BLUE CROSS/BLUE SHIELD | Attending: Emergency Medicine | Admitting: Emergency Medicine

## 2022-06-06 ENCOUNTER — Encounter (HOSPITAL_BASED_OUTPATIENT_CLINIC_OR_DEPARTMENT_OTHER): Payer: Self-pay | Admitting: Emergency Medicine

## 2022-06-06 DIAGNOSIS — Z79899 Other long term (current) drug therapy: Secondary | ICD-10-CM | POA: Insufficient documentation

## 2022-06-06 DIAGNOSIS — R519 Headache, unspecified: Secondary | ICD-10-CM | POA: Diagnosis not present

## 2022-06-06 DIAGNOSIS — H538 Other visual disturbances: Secondary | ICD-10-CM | POA: Diagnosis not present

## 2022-06-06 DIAGNOSIS — K0889 Other specified disorders of teeth and supporting structures: Secondary | ICD-10-CM

## 2022-06-06 DIAGNOSIS — E119 Type 2 diabetes mellitus without complications: Secondary | ICD-10-CM | POA: Insufficient documentation

## 2022-06-06 DIAGNOSIS — R29810 Facial weakness: Secondary | ICD-10-CM | POA: Insufficient documentation

## 2022-06-06 DIAGNOSIS — I639 Cerebral infarction, unspecified: Secondary | ICD-10-CM | POA: Insufficient documentation

## 2022-06-06 DIAGNOSIS — M25511 Pain in right shoulder: Secondary | ICD-10-CM

## 2022-06-06 DIAGNOSIS — M542 Cervicalgia: Secondary | ICD-10-CM

## 2022-06-06 DIAGNOSIS — I160 Hypertensive urgency: Secondary | ICD-10-CM | POA: Diagnosis not present

## 2022-06-06 DIAGNOSIS — R29898 Other symptoms and signs involving the musculoskeletal system: Secondary | ICD-10-CM

## 2022-06-06 DIAGNOSIS — Z853 Personal history of malignant neoplasm of breast: Secondary | ICD-10-CM | POA: Insufficient documentation

## 2022-06-06 DIAGNOSIS — M7918 Myalgia, other site: Secondary | ICD-10-CM | POA: Diagnosis not present

## 2022-06-06 LAB — DIFFERENTIAL
Abs Immature Granulocytes: 0.03 10*3/uL (ref 0.00–0.07)
Basophils Absolute: 0 10*3/uL (ref 0.0–0.1)
Basophils Relative: 0 %
Eosinophils Absolute: 0.1 10*3/uL (ref 0.0–0.5)
Eosinophils Relative: 2 %
Immature Granulocytes: 1 %
Lymphocytes Relative: 35 %
Lymphs Abs: 1.9 10*3/uL (ref 0.7–4.0)
Monocytes Absolute: 0.4 10*3/uL (ref 0.1–1.0)
Monocytes Relative: 7 %
Neutro Abs: 3.1 10*3/uL (ref 1.7–7.7)
Neutrophils Relative %: 55 %

## 2022-06-06 LAB — COMPREHENSIVE METABOLIC PANEL
ALT: 19 U/L (ref 0–44)
AST: 30 U/L (ref 15–41)
Albumin: 4.2 g/dL (ref 3.5–5.0)
Alkaline Phosphatase: 98 U/L (ref 38–126)
Anion gap: 5 (ref 5–15)
BUN: 17 mg/dL (ref 8–23)
CO2: 29 mmol/L (ref 22–32)
Calcium: 9.9 mg/dL (ref 8.9–10.3)
Chloride: 103 mmol/L (ref 98–111)
Creatinine, Ser: 0.68 mg/dL (ref 0.44–1.00)
GFR, Estimated: >60 mL/min (ref >60)
Glucose, Bld: 101 mg/dL — ABNORMAL HIGH (ref 70–99)
Potassium: 3.8 mmol/L (ref 3.5–5.1)
Sodium: 137 mmol/L (ref 135–145)
Total Bilirubin: 0.4 mg/dL (ref 0.3–1.2)
Total Protein: 9.5 g/dL — ABNORMAL HIGH (ref 6.5–8.1)

## 2022-06-06 LAB — URINALYSIS, ROUTINE W REFLEX MICROSCOPIC
Bacteria, UA: NONE SEEN
Bilirubin Urine: NEGATIVE
Glucose, UA: 500 mg/dL — AB
Hgb urine dipstick: NEGATIVE
Ketones, ur: NEGATIVE mg/dL
Leukocytes,Ua: NEGATIVE
Nitrite: NEGATIVE
Specific Gravity, Urine: 1.036 — ABNORMAL HIGH (ref 1.005–1.030)
pH: 7.5 (ref 5.0–8.0)

## 2022-06-06 LAB — CBC
HCT: 35.5 % — ABNORMAL LOW (ref 36.0–46.0)
Hemoglobin: 11 g/dL — ABNORMAL LOW (ref 12.0–15.0)
MCH: 27 pg (ref 26.0–34.0)
MCHC: 31 g/dL (ref 30.0–36.0)
MCV: 87 fL (ref 80.0–100.0)
Platelets: 120 10*3/uL — ABNORMAL LOW (ref 150–400)
RBC: 4.08 MIL/uL (ref 3.87–5.11)
RDW: 16.1 % — ABNORMAL HIGH (ref 11.5–15.5)
WBC: 5.5 10*3/uL (ref 4.0–10.5)
nRBC: 0 % (ref 0.0–0.2)

## 2022-06-06 LAB — PROTIME-INR
INR: 1 (ref 0.8–1.2)
Prothrombin Time: 13.2 seconds (ref 11.4–15.2)

## 2022-06-06 LAB — RAPID URINE DRUG SCREEN, HOSP PERFORMED
Amphetamines: NOT DETECTED
Barbiturates: NOT DETECTED
Benzodiazepines: NOT DETECTED
Cocaine: NOT DETECTED
Opiates: NOT DETECTED
Tetrahydrocannabinol: NOT DETECTED

## 2022-06-06 LAB — ETHANOL: Alcohol, Ethyl (B): <10 mg/dL (ref <10)

## 2022-06-06 LAB — APTT: aPTT: 27 seconds (ref 24–36)

## 2022-06-06 MED ORDER — HYDRALAZINE HCL 20 MG/ML IJ SOLN
10.0000 mg | Freq: Once | INTRAMUSCULAR | Status: AC
  Administered 2022-06-06: 10 mg via INTRAVENOUS
  Filled 2022-06-06: qty 1

## 2022-06-06 MED ORDER — LABETALOL HCL 5 MG/ML IV SOLN
20.0000 mg | Freq: Once | INTRAVENOUS | Status: AC
  Administered 2022-06-06: 20 mg via INTRAVENOUS
  Filled 2022-06-06: qty 4

## 2022-06-06 MED ORDER — IOHEXOL 300 MG/ML  SOLN
100.0000 mL | Freq: Once | INTRAMUSCULAR | Status: AC | PRN
  Administered 2022-06-06: 75 mL via INTRAVENOUS

## 2022-06-06 NOTE — ED Triage Notes (Signed)
Pt to triage c/o right sided facial droop and right arm weakness with last known well time over 24 hours prior to arrival. Pt presents a/o x 4 skin warm dry intact.  100%on room air  HTN 221/100. Pt denies blood thinners.

## 2022-06-06 NOTE — ED Provider Notes (Signed)
[facility] Provider Note   CSN: [id number] Arrival date & time: 06/06/22  1954     History  Chief Complaint  Patient presents with   Facial Droop    Marie Church is a 67 y.o. female.  She is here with a complaint of pain in the right side of her face and some weakness in her right arm.  She has had the facial pain on and off for a while but was worse today.  She is not sure if it is her teeth or something else.  She is also had worsening weakness of her right arm.  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  She is also felt some weakness generally and had a harder time getting out of her chair tonight.  She has chronically blurry vision.  She has no numbness.  History of breast cancer, diabetes hypertensive urgency.  She tells me she has had some chronic swelling on the right side of her neck.  The history is provided by the patient and a relative.       Home Medications Prior to Admission medications   Medication Sig Start Date End Date Taking? Authorizing Provider  acetaminophen (TYLENOL) 500 MG tablet Take 1,000 mg by mouth every 6 (six) hours as needed for moderate pain or headache.    [provider]  azithromycin (ZITHROMAX) 250 MG tablet Take 1 tablet (250 mg total) by mouth daily. Take first 2 tablets together, then 1 every day until finished. 03/04/22   Fredia Sorrow, MD  carvedilol (COREG) 12.5 MG tablet Take 1 tablet (12.5 mg total) by mouth 2 (two) times daily with a meal. 04/15/21   Shelly Coss, MD  Dexlansoprazole 30 MG capsule Take 30 mg by mouth daily as needed (for acid reflux).    [provider]  furosemide (LASIX) 40 MG tablet Take 0.5 tablets (20 mg total) by mouth daily. 01/08/21   Larey Dresser, MD  guaiFENesin-dextromethorphan [facility] DM) 100-10 MG/5ML syrup Take 5 mLs by mouth every 4 (four) hours as needed for cough.  04/15/21   Shelly Coss, MD  mirtazapine (REMERON) 7.5 MG tablet Take 1 tablet (7.5 mg total) by mouth at bedtime. Patient not taking: Reported on 05/22/2021 04/15/21   Shelly Coss, MD  potassium chloride SA (KLOR-CON M) 20 MEQ tablet Take 1 tablet (20 mEq total) by mouth daily. Patient not taking: Reported on 05/22/2021 04/15/21   Shelly Coss, MD  sacubitril-valsartan (ENTRESTO) 24-26 MG Take 1 tablet by mouth 2 (two) times daily. 10/28/20   Almyra Deforest, PA  spironolactone (ALDACTONE) 25 MG tablet Take 0.5 tablets (12.5 mg total) by mouth daily. Patient not taking: Reported on 05/22/2021 11/07/20   Croitoru, Dani Gobble, MD      Allergies    Doxycycline, Amoxicillin-pot clavulanate, and Lisinopril    Review of Systems   Review of Systems  Constitutional:  Negative for fever.  HENT:  Positive for dental problem. Negative for sore throat and trouble swallowing.   Eyes:  Positive for visual disturbance.  Respiratory:  Negative for shortness of breath.   Cardiovascular:  Negative for chest pain.  Gastrointestinal:  Negative for abdominal pain.  Genitourinary:  Negative for dysuria.  Musculoskeletal:  Positive for neck pain.  Skin:  Negative for rash.  Neurological:  Positive for facial asymmetry and weakness. Negative for syncope, speech difficulty and numbness.    Physical Exam Updated Vital  Signs BP (!) 221/100 (BP Location: Left Arm)   Pulse 98   Temp 98.9 F (37.2 C)   Resp 18   Wt 79.8 kg   SpO2 100%   BMI 28.41 kg/m  Physical Exam Vitals and nursing note reviewed.  Constitutional:      General: She is not in acute distress.    Appearance: Normal appearance. She is well-developed.  HENT:     Head: Normocephalic and atraumatic.     Mouth/Throat:     Mouth: Mucous membranes are moist.     Comments: Generally poor dentition no obvious abscess Eyes:     Conjunctiva/sclera: Conjunctivae normal.  Cardiovascular:     Rate and Rhythm: Normal rate and regular rhythm.     Heart  sounds: No murmur heard. Pulmonary:     Effort: Pulmonary effort is normal. No respiratory distress.     Breath sounds: Normal breath sounds.  Abdominal:     Palpations: Abdomen is soft.     Tenderness: There is no abdominal tenderness. There is no guarding or rebound.  Musculoskeletal:        General: No deformity.     Cervical back: Neck supple.  Skin:    General: Skin is warm and dry.     Capillary Refill: Capillary refill takes less than 2 seconds.  Neurological:     Mental Status: She is alert.     Comments: She is awake and alert.  There is no gross facial asymmetry.  She does have weakness of her right upper arm mostly deltoid.  She has 5 out of 5 strength lower extremities.  No sensory changes.  No dysarthria.     ED Results / Procedures / Treatments   Labs (all labs ordered are listed, but only abnormal results are displayed) Labs Reviewed  CBC - Abnormal; Notable for the following components:      Result Value   Hemoglobin 11.0 (*)    HCT 35.5 (*)    RDW 16.1 (*)    Platelets 120 (*)    All other components within normal limits  COMPREHENSIVE METABOLIC PANEL - Abnormal; Notable for the following components:   Glucose, Bld 101 (*)    Total Protein 9.5 (*)    All other components within normal limits  URINALYSIS, ROUTINE W REFLEX MICROSCOPIC - Abnormal; Notable for the following components:   Color, Urine COLORLESS (*)    Specific Gravity, Urine 1.036 (*)    Glucose, UA 500 (*)    Protein, ur TRACE (*)    All other components within normal limits  ETHANOL  PROTIME-INR  APTT  DIFFERENTIAL  RAPID URINE DRUG SCREEN, HOSP PERFORMED    EKG EKG Interpretation  Date/Time:  Saturday June 06 2022 20:20:00 EST Ventricular Rate:  101 PR Interval:  222 QRS Duration: 78 QT Interval:  361 QTC Calculation: 468 R Axis:   42 Text Interpretation: Sinus tachycardia Prolonged PR interval Probable LVH with secondary repol abnrm No significant change since prior 10/23  Confirmed by Aletta Edouard [id number]) on 06/06/2022 8:37:10 PM  Radiology MR BRAIN WO CONTRAST  Result Date: 06/07/2022 CLINICAL DATA:  Stroke suspected EXAM: MRI HEAD WITHOUT CONTRAST TECHNIQUE: Multiplanar, multiecho pulse sequences of the brain and surrounding structures were obtained without intravenous contrast. COMPARISON:  Head CT from yesterday FINDINGS: Brain: No acute infarction, hemorrhage, hydrocephalus, extra-axial collection or mass effect. Chart history of small-vessel risk factors with moderate chronic small vessel ischemia in the cerebral white matter. Preserved brain volume. Vascular: Normal flow  voids. Skull and upper cervical spine: Normal marrow signal. There is a nodule in the left ventral upper spinal canal which is partially calcified by CT and measures 6 mm. No notable regional degenerative changes to imply ganglion. Sinuses/Orbits: Trapped fluid at the left petrous apex, also seen in 2022 by brain MRI. No restricted diffusion or expansile features. IMPRESSION: 1. No acute finding.  Negative for infarct. 2. Moderate chronic small vessel ischemia. 3. 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 Electronically Signed   By: Jorje Guild M.D.   On: 06/07/2022 06:29   DG Chest Port 1 View  Result Date: 06/06/2022 CLINICAL DATA:  Right-sided facial droop and right arm weakness. EXAM: PORTABLE CHEST 1 VIEW COMPARISON:  March 11, 2022 FINDINGS: There is stable left-sided venous Port-A-Cath positioning. The heart size and mediastinal contours are within normal limits. Low lung volumes are noted. Mild atelectasis is seen within the bilateral lung bases, right greater than left. Radiopaque surgical clips are seen within the anteromedial aspect of the right lung base. There is no evidence of a pleural effusion or pneumothorax. The visualized skeletal structures are unremarkable. IMPRESSION: Low lung volumes with mild bibasilar atelectasis,  right greater than left. Electronically Signed   By: Virgina Norfolk M.D.   On: 06/06/2022 22:26   CT HEAD WO CONTRAST  Result Date: 06/06/2022 CLINICAL DATA:  Right facial droop and right arm weakness EXAM: CT HEAD WITHOUT CONTRAST CT MAXILLOFACIAL WITHOUT CONTRAST CT CERVICAL SPINE WITHOUT CONTRAST TECHNIQUE: Multidetector CT imaging of the head, cervical spine, and maxillofacial structures were performed using the standard protocol without intravenous contrast. Multiplanar CT image reconstructions of the cervical spine and maxillofacial structures were also generated. RADIATION DOSE REDUCTION: This exam was performed according to the departmental dose-optimization program which includes automated exposure control, adjustment of the mA and/or kV according to patient size and/or use of iterative reconstruction technique. COMPARISON:  None Available. FINDINGS: CT HEAD FINDINGS Brain: There is no mass, hemorrhage or extra-axial collection. The size and configuration of the ventricles and extra-axial CSF spaces are normal. The brain parenchyma is normal, without evidence of acute or chronic infarction. Vascular: No abnormal hyperdensity of the major intracranial arteries or dural venous sinuses. No intracranial atherosclerosis. Skull: The visualized skull base, calvarium and extracranial soft tissues are normal. CT MAXILLOFACIAL FINDINGS Osseous: No facial fracture or mandibular dislocation. Orbits: The globes are intact. Normal appearance of the intra- and extraconal fat. Symmetric extraocular muscles and optic nerves. Sinuses: No fluid levels or advanced mucosal thickening. Soft tissues: Large posterior right neck lipoma. CT CERVICAL SPINE FINDINGS Alignment: No static subluxation. Facets are aligned. Occipital condyles and the lateral masses of C1-C2 are aligned. Skull base and vertebrae: No acute fracture. Soft tissues and spinal canal: No prevertebral fluid or swelling. No visible canal hematoma. Disc  levels: No advanced spinal canal or neural foraminal stenosis. Upper chest: Right apical opacities, incompletely visualized. Other: Normal visualized paraspinal cervical soft tissues. IMPRESSION: 1. No acute intracranial abnormality. 2. No acute fracture or static subluxation of the cervical spine. 3. No facial fracture. 4. Right apical opacities, incompletely visualized. Correlate with chest radiograph. Electronically Signed   By: Ulyses Jarred M.D.   On: 06/06/2022 21:52   CT CERVICAL SPINE WO CONTRAST  Result Date: 06/06/2022 CLINICAL DATA:  Right facial droop and right arm weakness EXAM: CT HEAD WITHOUT CONTRAST CT MAXILLOFACIAL WITHOUT CONTRAST CT CERVICAL SPINE WITHOUT CONTRAST TECHNIQUE: Multidetector CT imaging of the head,  cervical spine, and maxillofacial structures were performed using the standard protocol without intravenous contrast. Multiplanar CT image reconstructions of the cervical spine and maxillofacial structures were also generated. RADIATION DOSE REDUCTION: This exam was performed according to the departmental dose-optimization program which includes automated exposure control, adjustment of the mA and/or kV according to patient size and/or use of iterative reconstruction technique. COMPARISON:  None Available. FINDINGS: CT HEAD FINDINGS Brain: There is no mass, hemorrhage or extra-axial collection. The size and configuration of the ventricles and extra-axial CSF spaces are normal. The brain parenchyma is normal, without evidence of acute or chronic infarction. Vascular: No abnormal hyperdensity of the major intracranial arteries or dural venous sinuses. No intracranial atherosclerosis. Skull: The visualized skull base, calvarium and extracranial soft tissues are normal. CT MAXILLOFACIAL FINDINGS Osseous: No facial fracture or mandibular dislocation. Orbits: The globes are intact. Normal appearance of the intra- and extraconal fat. Symmetric extraocular muscles and optic nerves. Sinuses:  No fluid levels or advanced mucosal thickening. Soft tissues: Large posterior right neck lipoma. CT CERVICAL SPINE FINDINGS Alignment: No static subluxation. Facets are aligned. Occipital condyles and the lateral masses of C1-C2 are aligned. Skull base and vertebrae: No acute fracture. Soft tissues and spinal canal: No prevertebral fluid or swelling. No visible canal hematoma. Disc levels: No advanced spinal canal or neural foraminal stenosis. Upper chest: Right apical opacities, incompletely visualized. Other: Normal visualized paraspinal cervical soft tissues. IMPRESSION: 1. No acute intracranial abnormality. 2. No acute fracture or static subluxation of the cervical spine. 3. No facial fracture. 4. Right apical opacities, incompletely visualized. Correlate with chest radiograph. Electronically Signed   By: Ulyses Jarred M.D.   On: 06/06/2022 21:52   CT Maxillofacial W Contrast  Result Date: 06/06/2022 CLINICAL DATA:  Right facial droop and right arm weakness EXAM: CT HEAD WITHOUT CONTRAST CT MAXILLOFACIAL WITHOUT CONTRAST CT CERVICAL SPINE WITHOUT CONTRAST TECHNIQUE: Multidetector CT imaging of the head, cervical spine, and maxillofacial structures were performed using the standard protocol without intravenous contrast. Multiplanar CT image reconstructions of the cervical spine and maxillofacial structures were also generated. RADIATION DOSE REDUCTION: This exam was performed according to the departmental dose-optimization program which includes automated exposure control, adjustment of the mA and/or kV according to patient size and/or use of iterative reconstruction technique. COMPARISON:  None Available. FINDINGS: CT HEAD FINDINGS Brain: There is no mass, hemorrhage or extra-axial collection. The size and configuration of the ventricles and extra-axial CSF spaces are normal. The brain parenchyma is normal, without evidence of acute or chronic infarction. Vascular: No abnormal hyperdensity of the major  intracranial arteries or dural venous sinuses. No intracranial atherosclerosis. Skull: The visualized skull base, calvarium and extracranial soft tissues are normal. CT MAXILLOFACIAL FINDINGS Osseous: No facial fracture or mandibular dislocation. Orbits: The globes are intact. Normal appearance of the intra- and extraconal fat. Symmetric extraocular muscles and optic nerves. Sinuses: No fluid levels or advanced mucosal thickening. Soft tissues: Large posterior right neck lipoma. CT CERVICAL SPINE FINDINGS Alignment: No static subluxation. Facets are aligned. Occipital condyles and the lateral masses of C1-C2 are aligned. Skull base and vertebrae: No acute fracture. Soft tissues and spinal canal: No prevertebral fluid or swelling. No visible canal hematoma. Disc levels: No advanced spinal canal or neural foraminal stenosis. Upper chest: Right apical opacities, incompletely visualized. Other: Normal visualized paraspinal cervical soft tissues. IMPRESSION: 1. No acute intracranial abnormality. 2. No acute fracture or static subluxation of the cervical spine. 3. No facial fracture. 4. Right apical opacities, incompletely visualized. Correlate  with chest radiograph. Electronically Signed   By: Ulyses Jarred M.D.   On: 06/06/2022 21:52    Procedures .Critical Care  Performed by: Hayden Rasmussen, MD Authorized by: Hayden Rasmussen, MD   Critical care provider statement:    Critical care time (minutes):  45   Critical care time was exclusive of:  Separately billable procedures and treating other patients   Critical care was necessary to treat or prevent imminent or life-threatening deterioration of the following conditions:  CNS failure or compromise and circulatory failure   Critical care was time spent personally by me on the following activities:  Development of treatment plan with patient or surrogate, discussions with consultants, evaluation of patient's response to treatment, examination of patient,  obtaining history from patient or surrogate, ordering and performing treatments and interventions, ordering and review of laboratory studies, ordering and review of radiographic studies, pulse oximetry, re-evaluation of patient's condition and review of old charts   I assumed direction of critical care for this patient from another provider in my specialty: no       Medications Ordered in ED Medications  hydrALAZINE (APRESOLINE) injection 10 mg (10 mg Intravenous Given 06/06/22 2156)  iohexol (OMNIPAQUE) 300 MG/ML solution 100 mL (75 mLs Intravenous Contrast Given 06/06/22 2119)  labetalol (NORMODYNE) injection 20 mg (20 mg Intravenous Given 06/06/22 2325)  LORazepam (ATIVAN) injection 1 mg (1 mg Intravenous Given 06/07/22 0540)    ED Course/ Medical Decision Making/ A&P Clinical Course as of 06/07/22 6629  Sat Jun 06, 2022  2250 Reviewed the findings on physical exam and history with neurology Dr. Lorrin Goodell.  He felt with her multiple stroke risk factors she would benefit from an MRI to fully exclude stroke. [MB]  2252 Patient is agreeable to transfer over to [facility] to get an MRI.  Patient has been accepted by Dr. Alvino Chapel ED physician. [MB]    Clinical Course User Index [MB] Hayden Rasmussen, MD                             Medical Decision Making Amount and/or Complexity of Data Reviewed Labs: ordered. Radiology: ordered.  Risk Prescription drug management.   This patient complains of right facial pain, possible facial droop, right arm weakness, elevated blood pressure general weakness; this involves an extensive number of treatment Options and is a complaint that carries with it a high risk of complications and morbidity. The differential includes stroke, bleed, tumor, hypertensive emergency  I ordered, reviewed and interpreted labs, which included CBC with normal white count, hemoglobin low stable from priors, platelets low better than priors, chemistries fairly unremarkable,  urinalysis without signs of infection, alcohol tox negative I ordered medication IV hydralazine IV labetalol for accelerated hypertension and reviewed PMP when indicated. I ordered imaging studies which included CT head, MRI brain and I independently    visualized and interpreted imaging which showed no acute findings Additional history obtained from patient's son Previous records obtained and reviewed in epic I consulted neurology Dr. Lorrin Goodell And discussed lab and imaging findings and discussed disposition.  Cardiac monitoring reviewed, normal sinus rhythm Social determinants considered, multiple barriers including food insecurity stressors isolation transportation needs Critical Interventions: Initiation of IV medications for patient's significantly elevated blood pressure  After the interventions stated above, I reevaluated the patient and found patient to be in no distress. Admission and further testing considered, she will need further imaging and possible further management of her  blood pressure.  She is agreeable to transfer over to [facility] to get an MRI.  Discussed with Dr. Alvino Chapel ED physician at [facility].         Final Clinical Impression(s) / ED Diagnoses Final diagnoses:  Right facial pain  Right arm weakness  Hypertensive urgency    Rx / DC Orders ED Discharge Orders     None         Hayden Rasmussen, MD 06/07/22 [phone]

## 2022-06-07 ENCOUNTER — Emergency Department (HOSPITAL_COMMUNITY): Payer: BLUE CROSS/BLUE SHIELD

## 2022-06-07 DIAGNOSIS — I160 Hypertensive urgency: Secondary | ICD-10-CM | POA: Diagnosis not present

## 2022-06-07 DIAGNOSIS — M542 Cervicalgia: Secondary | ICD-10-CM | POA: Diagnosis not present

## 2022-06-07 DIAGNOSIS — K0889 Other specified disorders of teeth and supporting structures: Secondary | ICD-10-CM

## 2022-06-07 DIAGNOSIS — R29810 Facial weakness: Secondary | ICD-10-CM | POA: Diagnosis present

## 2022-06-07 DIAGNOSIS — M7918 Myalgia, other site: Secondary | ICD-10-CM | POA: Diagnosis not present

## 2022-06-07 DIAGNOSIS — Z853 Personal history of malignant neoplasm of breast: Secondary | ICD-10-CM | POA: Diagnosis not present

## 2022-06-07 DIAGNOSIS — M25511 Pain in right shoulder: Secondary | ICD-10-CM

## 2022-06-07 DIAGNOSIS — R519 Headache, unspecified: Secondary | ICD-10-CM | POA: Diagnosis not present

## 2022-06-07 DIAGNOSIS — H538 Other visual disturbances: Secondary | ICD-10-CM | POA: Diagnosis not present

## 2022-06-07 DIAGNOSIS — Z79899 Other long term (current) drug therapy: Secondary | ICD-10-CM | POA: Diagnosis not present

## 2022-06-07 DIAGNOSIS — E119 Type 2 diabetes mellitus without complications: Secondary | ICD-10-CM | POA: Diagnosis not present

## 2022-06-07 MED ORDER — DICLOFENAC SODIUM 1 % EX GEL: 4.0000 g | Freq: Four times a day (QID) | CUTANEOUS | 0 refills | Status: DC

## 2022-06-07 MED ORDER — CLINDAMYCIN HCL 150 MG PO CAPS
150.0000 mg | ORAL_CAPSULE | Freq: Three times a day (TID) | ORAL | 0 refills | Status: DC
  Filled 2022-06-07: qty 21, 7d supply, fill #0

## 2022-06-07 MED ORDER — CLINDAMYCIN HCL 150 MG PO CAPS: 150.0000 mg | ORAL_CAPSULE | Freq: Three times a day (TID) | ORAL | 0 refills | Status: DC

## 2022-06-07 MED ORDER — LORAZEPAM 2 MG/ML IJ SOLN
1.0000 mg | Freq: Once | INTRAMUSCULAR | Status: AC
  Administered 2022-06-07: 1 mg via INTRAVENOUS
  Filled 2022-06-07: qty 1

## 2022-06-07 NOTE — Consult Note (Signed)
Neurology Consultation  Reason for Consult: Right upper extremity weakness, MRI brain negative Referring Physician: Dr. Sedonia Small  CC: Bilateral tooth pain, right neck muscle pain  History is obtained from: Patient, patient's son at bedside, chart review  HPI: Marie Church is a 67 y.o. female with medical history significant for anemia, HFrEF, DM2, essential hypertension, right breast ductal carcinoma s/p lumpectomy with axillary lymph node removal, radiation, and chemotherapy who presented to [facility] on 06/06/22 for evaluation of worsening of her chronic right arm weakness and reports of right face "feeling funny".  Patient states that on 06/06/2022 she went to bed in her usual state of health at 02:00 and when she woke up at 09:00, she could not lift her right arm. She was able to bend her elbow to bring her right hand to her mouth but she was unable to lift the right arm at the shoulder. She denies any arm pain with arm elevation but states that the arm felt "tight" and she could not lift it.  At baseline, she does have some weakness of her right arm but she is usually able to lift it without difficulty.  She states she also felt that her right face felt "funny" but denies numbness, tingling, or pain. She states that she did not look in the mirror to notice any facial asymmetry but she was able to eat and drink as usual without drooling or dribbling. No family saw the patient with facial asymmetry but states they feel that her right face may be slightly swollen.  Patient endorses bilateral tooth pain and right neck muscular pain as well.  Patient does have a large fatty tissue growth on the right neck that is chronic and tenderness to palpation over the right neck and shoulder.  On initial ED arrival, patient's blood pressure was significantly elevated at 221/100 mmHg.  Patient was subsequently transferred to [facility] for MRI brain.   ROS: A complete ROS was performed and is negative  except as noted in the HPI.   Past Medical History:  Diagnosis Date   Breast cancer ([place])    undergoing chemothearpy    DM type 2 (diabetes mellitus, type 2) ([place])    reportedly steroid induced   Hypertension    Past Surgical History:  Procedure Laterality Date   BREAST LUMPECTOMY Right 11/30/2020   BRONCHIAL BIOPSY  04/09/2021   Procedure: BRONCHIAL BIOPSIES;  Surgeon: [name], MD;  Location: [facility];  Service: Pulmonary;;   BRONCHIAL NEEDLE ASPIRATION BIOPSY  04/09/2021   Procedure: BRONCHIAL NEEDLE ASPIRATION BIOPSIES;  Surgeon: [name], MD;  Location: [facility];  Service: Pulmonary;;   BRONCHIAL WASHINGS  04/09/2021   Procedure: BRONCHIAL WASHINGS;  Surgeon: [name], MD;  Location: [facility];  Service: Pulmonary;;   ENDOBRONCHIAL ULTRASOUND N/A 04/09/2021   Procedure: ENDOBRONCHIAL ULTRASOUND;  Surgeon: [name], MD;  Location: [facility];  Service: Pulmonary;  Laterality: N/A;   HEMOSTASIS CONTROL  04/09/2021   Procedure: HEMOSTASIS CONTROL;  Surgeon: [name], MD;  Location: [facility];  Service: Pulmonary;;   RIGHT/LEFT HEART CATH AND CORONARY ANGIOGRAPHY N/A 10/10/2020   Procedure: RIGHT/LEFT HEART CATH AND CORONARY ANGIOGRAPHY;  Surgeon: Jolaine Artist, MD;  Location: [facility];  Service: Cardiovascular;  Laterality: N/A;   VIDEO BRONCHOSCOPY N/A 04/09/2021   Procedure: VIDEO BRONCHOSCOPY WITH FLUORO;  Surgeon: [name], MD;  Location: [facility];  Service: Pulmonary;  Laterality: N/A;   Family History  Problem  Relation Age of Onset   Heart failure Neg Hx    Heart disease Neg Hx    Heart attack Neg Hx    Social History:   reports that she has quit smoking. Her smoking use included cigarettes. She has never used smokeless tobacco. She reports that she does not drink alcohol and does not use drugs.  Medications No current facility-administered medications for this  encounter.  Current Outpatient Medications:    acetaminophen (TYLENOL) 500 MG tablet, Take 1,000 mg by mouth every 6 (six) hours as needed for moderate pain or headache., Disp: , Rfl:    azithromycin (ZITHROMAX) 250 MG tablet, Take 1 tablet (250 mg total) by mouth daily. Take first 2 tablets together, then 1 every day until finished., Disp: 6 tablet, Rfl: 0   carvedilol (COREG) 12.5 MG tablet, Take 1 tablet (12.5 mg total) by mouth 2 (two) times daily with a meal., Disp: 60 tablet, Rfl: 0   Dexlansoprazole 30 MG capsule, Take 30 mg by mouth daily as needed (for acid reflux)., Disp: , Rfl:    furosemide (LASIX) 40 MG tablet, Take 0.5 tablets (20 mg total) by mouth daily., Disp: 15 tablet, Rfl: 11   guaiFENesin-dextromethorphan (ROBITUSSIN DM) 100-10 MG/5ML syrup, Take 5 mLs by mouth every 4 (four) hours as needed for cough., Disp: 118 mL, Rfl: 1   mirtazapine (REMERON) 7.5 MG tablet, Take 1 tablet (7.5 mg total) by mouth at bedtime. (Patient not taking: Reported on 05/22/2021), Disp: 30 tablet, Rfl: 0   potassium chloride SA (KLOR-CON M) 20 MEQ tablet, Take 1 tablet (20 mEq total) by mouth daily. (Patient not taking: Reported on 05/22/2021), Disp: 60 tablet, Rfl: 3   sacubitril-valsartan (ENTRESTO) 24-26 MG, Take 1 tablet by mouth 2 (two) times daily., Disp: 60 tablet, Rfl: 11   spironolactone (ALDACTONE) 25 MG tablet, Take 0.5 tablets (12.5 mg total) by mouth daily. (Patient not taking: Reported on 05/22/2021), Disp: 30 tablet, Rfl: 11  Exam: Current vital signs: BP (!) 160/83 (BP Location: Left Arm)   Pulse 94   Temp 99 F (37.2 C) (Oral)   Resp 17   Wt 79.8 kg   SpO2 98%   BMI 28.41 kg/m  Vital signs in last 24 hours: Temp:  [98.6 F (37 C)-99 F (37.2 C)] 99 F (37.2 C) (01/21 0153) Pulse Rate:  [88-104] 94 (01/21 0153) Resp:  [17-30] 17 (01/21 0153) BP: (133-221)/(75-101) 160/83 (01/21 0153) SpO2:  [96 %-100 %] 98 % (01/21 0153) Weight:  [79.8 kg] 79.8 kg (01/20 2011)  GENERAL:  Awake, alert, in no acute distress Psych: Affect appropriate for situation, patient is calm and cooperative with examination Head: Normocephalic and atraumatic, without obvious abnormality EENT: Normal conjunctivae, dry mucous membranes, no OP obstruction LUNGS: Normal respiratory effort. Non-labored breathing on room air CV: Extremities warm, well-perfused, no pedal edema noted ABDOMEN: Soft, non-tender, non-distended Extremities: No joint deformity, swelling noted  NEURO:  Mental Status: Awake, alert, and oriented to person, place, time, and situation. She is able to provide a clear and coherent history of present illness. Speech/Language: speech is intact without dysarthria.  No aphasia or neglect is noted. Follows commands without difficulty. Cranial Nerves:  II: PERRL. Visual fields full.  III, IV, VI: EOMI without ptosis, nystagmus V: Sensation is intact to light touch and symmetrical to face.  VII: Face is symmetric resting and with movement VIII: Hearing intact to voice IX, X: Palate elevation is symmetric. Phonation normal.  XI: Normal sternocleidomastoid and trapezius muscle strength  XII: Tongue protrudes midline without fasciculations.   Motor: 5/5 strength present in bilateral lower extremities. Left upper extremity elevates antigravity without vertical drift. 5/5 strength throughout Right upper extremity with 5/5 grip strength and 5/5 biceps. Triceps 4-/5 and 4/5 deltoid with coaching. Patient has no vertical drift. (States she is still weaker than her baseline chronic right arm weakness but improved from initial noted weakness on presentation) Tone is normal. Bulk is normal.  Sensation: Intact to light touch bilaterally in all four extremities.  Coordination: FTN intact bilaterally. HKS intact bilaterally.  Gait: Deferred  Labs I have reviewed labs in epic and the results pertinent to this consultation are: CBC    Component Value Date/Time   WBC 5.5 06/06/2022 2033    RBC 4.08 06/06/2022 2033   HGB 11.0 (L) 06/06/2022 2033   HCT 35.5 (L) 06/06/2022 2033   PLT 120 (L) 06/06/2022 2033   MCV 87.0 06/06/2022 2033   MCH 27.0 06/06/2022 2033   MCHC 31.0 06/06/2022 2033   RDW 16.1 (H) 06/06/2022 2033   LYMPHSABS 1.9 06/06/2022 2033   MONOABS 0.4 06/06/2022 2033   EOSABS 0.1 06/06/2022 2033   BASOSABS 0.0 06/06/2022 2033   CMP     Component Value Date/Time   NA 137 06/06/2022 2033   NA 143 10/28/2020 1510   K 3.8 06/06/2022 2033   CL 103 06/06/2022 2033   CO2 29 06/06/2022 2033   GLUCOSE 101 (H) 06/06/2022 2033   BUN 17 06/06/2022 2033   BUN 15 10/28/2020 1510   CREATININE 0.68 06/06/2022 2033   CALCIUM 9.9 06/06/2022 2033   PROT 9.5 (H) 06/06/2022 2033   ALBUMIN 4.2 06/06/2022 2033   AST 30 06/06/2022 2033   ALT 19 06/06/2022 2033   ALKPHOS 98 06/06/2022 2033   BILITOT 0.4 06/06/2022 2033   GFRNONAA >60 06/06/2022 2033   Lipid Panel     Component Value Date/Time   CHOL 91 10/05/2020 0108   TRIG 38 10/05/2020 0108   HDL 33 (L) 10/05/2020 0108   CHOLHDL 2.8 10/05/2020 0108   VLDL 8 10/05/2020 0108   LDLCALC 50 10/05/2020 0108   Lab Results  Component Value Date   HGBA1C 6.4 (H) 10/04/2020   Imaging I have reviewed the images obtained:  CT-scan of the brain 06/07/22: 1. No acute intracranial abnormality. 2. No acute fracture or static subluxation of the cervical spine. 3. No facial fracture. 4. Right apical opacities, incompletely visualized. Correlate with chest radiograph.  MRI examination of the brain 06/07/22: 1. No acute finding.  Negative for infarct. 2. Moderate chronic small vessel ischemia. 3. 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  Assessment: 67 year old female with PMHx of HTN, DM2, ductal carcinoma of the right breast s/p lumpectomy with axillary lymph node removal, chemo, and radiation, HFrEF, and anemia who presented to the ED 06/06/2022 for evaluation of  worsening of the right upper extremity chronic weakness when patient noted that she could not lift her right arm in addition to complaints of right face feeling "funny".  Patient is unable to further elaborate on abnormal facial feeling.  CT and MRI brain imaging negative for acute finding.  Neurology consulted for possible MRI negative for stroke.  On assessment, patient complained of right arm tightness limiting her ability to lift arm initially, denies sensory disturbance, denies right facial weakness, numbness, or tingling.  Right upper extremity strength reportedly improved from presentation though still weaker than usual.  Patient's  main complaint is right neck muscular pain and bilateral tooth pain.  Overall, I have a low suspicion for stroke etiology of patient's complaints as the weakness was contributed to proximal tightness initially. Weakness isolates to deltoid and tricep with intact strength throughout the remainder of the RUE. Weakness is accompanied by complaints of right neck pain with tenderness to palpation of the muscles of the right neck and shoulder. Patient's complaints of her face "feeling funny" was not associated with any facial weakness, numbness, or tingling typical for stroke etiology and she has chronic right and left facial pain due to tooth pain.  Recommendations: -No further inpatient neurologic workup indicated at this time -Please contact neurologist on call for further neurology related questions or concerns  Pt seen by NP/Neuro and later by MD. Note/plan to be edited by MD as needed.  Anibal Henderson, AGAC-NP [facility] Pager: [phone]   Attending Neurohospitalist Addendum Patient seen and examined with APP/Resident. Agree with the history and physical as documented above. Agree with the plan as documented, which I helped formulate. I have edited the note above to reflect my full findings and recommendations. I have independently reviewed the  chart, obtained history, review of systems and examined the patient.I have personally reviewed pertinent head/neck/spine imaging (CT/MRI). Please feel free to call with any questions.  R deltoid exam is pain-limited, otherwise full strength throughout. MRI brain neg. Presentation not c/w stroke and requires no further neurologic workup. R neck and shoulder are tender to palpation meaning MSK etiology. CC is toothache, recommend addressing this then OK to discharge from ED from neuro standpoint.  -- Su Monks, MD [facility] [phone]  If 7pm- 7am, please page neurology on call as listed in [place].

## 2022-06-07 NOTE — ED Provider Notes (Signed)
Patient has been cleared by neurology.  They have a low suspicion for stroke.  They feel that her symptoms may be coming from her neck.  She has been taking Tylenol for neck pain.  Will add Voltaren gel.  She also has a tooth ache and is trying to get into a dentist.  Will start clindamycin for this.  Encouraged her to follow-up with her primary care doctor or oncologist for ongoing care.  Her blood pressure has improved during her ED stay.  Return precautions were given.   Malvin Johns, MD 06/07/22 1100

## 2022-06-07 NOTE — ED Provider Notes (Addendum)
  Provider Note MRN:  [id number]  [place] date & time: 06/07/22    ED Course and Medical Decision Making  Assumed care from Dr. Melina Copa upon patient transfer.  Facial droop and right arm weakness transferred for MRI.  Last known well 2 AM yesterday.  Woke up with symptoms.  MRI negative, persistent symptoms, discussed case with Dr. Quinn Axe who will evaluate the patient.  Question MRI negative stroke.  Signed out to oncoming provider.  Procedures  Final Clinical Impressions(s) / ED Diagnoses     ICD-10-CM   1. Right facial pain  R51.9     2. Right arm weakness  R29.898     3. Hypertensive urgency  I16.0       ED Discharge Orders     None       Discharge Instructions   None     Barth Kirks. Sedonia Small, [place] [email or web address]    Maudie Flakes, MD 06/07/22 1062    Maudie Flakes, MD 06/07/22 [phone]

## 2022-06-07 NOTE — Discharge Instructions (Signed)
Follow-up with your [facility] care doctor or oncologist for ongoing care.  Return to emergency room if you have any worsening symptoms.

## 2022-06-07 NOTE — ED Notes (Signed)
Pt transferred from [facility] awaiting MRI

## 2022-06-07 NOTE — Care Management (Signed)
PCP information on AVS

## 2022-06-08 ENCOUNTER — Other Ambulatory Visit: Payer: Self-pay

## 2022-07-10 ENCOUNTER — Other Ambulatory Visit: Payer: Self-pay

## 2022-07-10 ENCOUNTER — Encounter (HOSPITAL_BASED_OUTPATIENT_CLINIC_OR_DEPARTMENT_OTHER): Payer: Self-pay

## 2022-07-10 ENCOUNTER — Emergency Department (HOSPITAL_BASED_OUTPATIENT_CLINIC_OR_DEPARTMENT_OTHER)
Admission: EM | Admit: 2022-07-10 | Discharge: 2022-07-10 | Disposition: A | Discharge status: Home / Self Care | Payer: BLUE CROSS/BLUE SHIELD | Attending: Emergency Medicine | Admitting: Emergency Medicine

## 2022-07-10 DIAGNOSIS — E119 Type 2 diabetes mellitus without complications: Secondary | ICD-10-CM | POA: Diagnosis not present

## 2022-07-10 DIAGNOSIS — U071 COVID-19: Secondary | ICD-10-CM | POA: Insufficient documentation

## 2022-07-10 DIAGNOSIS — I10 Essential (primary) hypertension: Secondary | ICD-10-CM | POA: Insufficient documentation

## 2022-07-10 DIAGNOSIS — R059 Cough, unspecified: Secondary | ICD-10-CM | POA: Diagnosis present

## 2022-07-10 DIAGNOSIS — Z853 Personal history of malignant neoplasm of breast: Secondary | ICD-10-CM | POA: Insufficient documentation

## 2022-07-10 DIAGNOSIS — Z79899 Other long term (current) drug therapy: Secondary | ICD-10-CM | POA: Diagnosis not present

## 2022-07-10 LAB — RESP PANEL BY RT-PCR (RSV, FLU A&B, COVID)  RVPGX2
Influenza A by PCR: NEGATIVE
Influenza B by PCR: NEGATIVE
Resp Syncytial Virus by PCR: NEGATIVE
SARS Coronavirus 2 by RT PCR: POSITIVE — AB

## 2022-07-10 MED ORDER — PAXLOVID (300/100) 20 X 150 MG & 10 X 100MG PO TBPK: 3.0000 | ORAL_TABLET | Freq: Two times a day (BID) | ORAL | 0 refills | Status: AC

## 2022-07-10 NOTE — Discharge Instructions (Signed)
You were seen today and found to be positive for COVID-19.  Refer to [employer] for quarantine information.  You will be prescribed Paxlovid.  Follow-up with your primary doctor.  If you develop new or worsening symptoms, you should be reevaluated.

## 2022-07-10 NOTE — ED Provider Notes (Signed)
[name]   CSN: [id number] Arrival date & time: 07/10/22  2037     History  Chief Complaint  Patient presents with   Cough    Marie Church is a 67 y.o. female.  HPI     This is a 67 year old female with a history of breast cancer who presents with upper respiratory symptoms.  Patient reports 2 to 3-day history of cough, body aches, congestion.  She took a COVID-19 test at home which was positive.  She states "but I am not sure that it is COVID."  No documented fevers.  No chest pain or shortness of breath.  She did not receive primary COVID vaccination secondary to cancer treatments at that time.  Home Medications Prior to Admission medications   Medication Sig Start Date End Date Taking? Authorizing Provider  acetaminophen (TYLENOL) 500 MG tablet Take 1,000 mg by mouth every 6 (six) hours as needed for moderate pain or headache.    [provider]  azithromycin (ZITHROMAX) 250 MG tablet Take 1 tablet (250 mg total) by mouth daily. Take first 2 tablets together, then 1 every day until finished. 03/04/22   Fredia Sorrow, MD  carvedilol (COREG) 12.5 MG tablet Take 1 tablet (12.5 mg total) by mouth 2 (two) times daily with a meal. 04/15/21   Shelly Coss, MD  clindamycin (CLEOCIN) 150 MG capsule Take 1 capsule (150 mg total) by mouth 3 (three) times daily. 06/07/22   Malvin Johns, MD  Dexlansoprazole 30 MG capsule Take 30 mg by mouth daily as needed (for acid reflux).    [provider]  diclofenac Sodium (VOLTAREN) 1 % GEL Apply 4 g topically 4 (four) times daily. 06/07/22   Malvin Johns, MD  furosemide (LASIX) 40 MG tablet Take 0.5 tablets (20 mg total) by mouth daily. 01/08/21   Larey Dresser, MD  guaiFENesin-dextromethorphan [facility] DM) 100-10 MG/5ML syrup Take 5 mLs by mouth every 4 (four) hours as needed for cough. 04/15/21   Shelly Coss, MD  mirtazapine (REMERON) 7.5 MG tablet  Take 1 tablet (7.5 mg total) by mouth at bedtime. Patient not taking: Reported on 05/22/2021 04/15/21   Shelly Coss, MD  potassium chloride SA (KLOR-CON M) 20 MEQ tablet Take 1 tablet (20 mEq total) by mouth daily. Patient not taking: Reported on 05/22/2021 04/15/21   Shelly Coss, MD  sacubitril-valsartan (ENTRESTO) 24-26 MG Take 1 tablet by mouth 2 (two) times daily. 10/28/20   Almyra Deforest, PA  spironolactone (ALDACTONE) 25 MG tablet Take 0.5 tablets (12.5 mg total) by mouth daily. Patient not taking: Reported on 05/22/2021 11/07/20   Croitoru, Dani Gobble, MD      Allergies    Doxycycline, Amoxicillin-pot clavulanate, and Lisinopril    Review of Systems   Review of Systems  Constitutional:  Negative for fever.  HENT:  Positive for congestion.   Respiratory:  Positive for cough.   Neurological:  Positive for headaches.  All other systems reviewed and are negative.   Physical Exam Updated Vital Signs BP 138/80   Pulse 86   Temp 98.2 F (36.8 C) (Oral)   Resp 19   Ht 1.676 m ('5\' 6"'$ )   Wt 79.8 kg   SpO2 98%   BMI 28.41 kg/m  Physical Exam Vitals and nursing note reviewed.  Constitutional:      Appearance: She is well-developed. She is not ill-appearing.  HENT:     Head: Normocephalic and atraumatic.     Nose:  Congestion present.     Mouth/Throat:     Mouth: Mucous membranes are moist.  Eyes:     Pupils: Pupils are equal, round, and reactive to light.  Cardiovascular:     Rate and Rhythm: Normal rate and regular rhythm.     Heart sounds: Normal heart sounds.  Pulmonary:     Effort: Pulmonary effort is normal. No respiratory distress.     Breath sounds: No wheezing.  Abdominal:     Palpations: Abdomen is soft.     Tenderness: There is no abdominal tenderness.  Musculoskeletal:     Cervical back: Neck supple.  Skin:    General: Skin is warm and dry.  Neurological:     Mental Status: She is alert and oriented to person, place, and time.  Psychiatric:        Mood and  Affect: Mood normal.     ED Results / Procedures / Treatments   Labs (all labs ordered are listed, but only abnormal results are displayed) Labs Reviewed  RESP PANEL BY RT-PCR (RSV, FLU A&B, COVID)  [id number] - Abnormal; Notable for the following components:      Result Value   SARS Coronavirus 2 by RT PCR POSITIVE (*)    All other components within normal limits    EKG None  Radiology No results found.  Procedures Procedures    Medications Ordered in ED Medications - No data to display  ED Course/ Medical Decision Making/ A&P                             Medical Decision Making Risk Prescription drug management.   This patient presents to the ED for concern of upper respiratory symptoms, this involves an extensive number of treatment options, and is a complaint that carries with it a high risk of complications and morbidity.  I considered the following differential and admission for this acute, potentially life threatening condition.  The differential diagnosis includes viral illness such as COVID or influenza, pneumonia, allergies  MDM:    This is a 67 year old female who presents with upper respiratory symptoms.  Positive COVID testing at home.  She is overall nontoxic and vital signs are reassuring.  She is in no respiratory distress.  She has not had received her COVID vaccinations.  COVID testing here is confirmatory positive.  I reviewed her chart.  She has always had a normal creatinine.  Because of her age and risk factors, she would be a candidate for Paxlovid.  We discussed this as well as supportive treatment.  Low suspicion for superimposed bacterial infection.  (Labs, imaging, consults)  Labs: I Ordered, and personally interpreted labs.  The pertinent results include: COVID and influenza testing  Imaging Studies ordered: I ordered imaging studies including N/A I independently visualized and interpreted imaging. I agree with the radiologist  interpretation  Additional history obtained from daughter.  External records from outside source obtained and reviewed including prior evaluations  Cardiac Monitoring: The patient does not maintained on a cardiac monitor.  If on the cardiac monitor, I personally viewed and interpreted the cardiac monitored which showed an underlying rhythm of: N/A  Reevaluation: After the interventions noted above, I reevaluated the patient and found that they have :stayed the same  Social Determinants of Health:  lives independently  Disposition: Discharge  Co morbidities that complicate the patient evaluation  Past Medical History:  Diagnosis Date   Breast cancer ([place])  undergoing chemothearpy    DM type 2 (diabetes mellitus, type 2) ([place])    reportedly steroid induced   Hypertension      Medicines Meds ordered this encounter  Medications   nirmatrelvir & ritonavir (PAXLOVID, 300/100,) 20 x 150 MG & 10 x '100MG'$  TBPK    Sig: Take 3 tablets by mouth 2 (two) times daily for 5 days.    Dispense:  30 tablet    Refill:  0    I have reviewed the patients home medicines and have made adjustments as needed  Problem List / ED Course: Problem List Items Addressed This Visit   None Visit Diagnoses     COVID-19    -  Primary   Relevant Medications   nirmatrelvir & ritonavir (PAXLOVID, 300/100,) 20 x 150 MG & 10 x '100MG'$  TBPK                   Final Clinical Impression(s) / ED Diagnoses Final diagnoses:  None    Rx / DC Orders ED Discharge Orders     None         Merryl Hacker, MD 07/10/22 2324

## 2022-07-10 NOTE — ED Triage Notes (Signed)
Patient here POV from Home.  Endorses feeling Ill for past 2-3 Days. Symptoms include Mild Cough, Headache, Aches, Congestion. Positive COVID-19 Test at Home.  NAD Noted during Triage. A&Ox4. GCS 15. Ambulatory.

## 2022-07-29 ENCOUNTER — Encounter (HOSPITAL_COMMUNITY): Payer: Self-pay | Admitting: Emergency Medicine

## 2022-07-29 ENCOUNTER — Emergency Department (HOSPITAL_COMMUNITY): Payer: BLUE CROSS/BLUE SHIELD

## 2022-07-29 ENCOUNTER — Inpatient Hospital Stay (HOSPITAL_COMMUNITY)
Admission: EM | Admit: 2022-07-29 | Discharge: 2022-08-04 | DRG: 315 | Disposition: A | Discharge status: Home / Self Care | Payer: BLUE CROSS/BLUE SHIELD | Attending: Internal Medicine | Admitting: Internal Medicine

## 2022-07-29 ENCOUNTER — Other Ambulatory Visit: Payer: Self-pay

## 2022-07-29 DIAGNOSIS — E876 Hypokalemia: Secondary | ICD-10-CM | POA: Diagnosis present

## 2022-07-29 DIAGNOSIS — Z79899 Other long term (current) drug therapy: Secondary | ICD-10-CM

## 2022-07-29 DIAGNOSIS — Z91148 Patient's other noncompliance with medication regimen for other reason: Secondary | ICD-10-CM

## 2022-07-29 DIAGNOSIS — R509 Fever, unspecified: Secondary | ICD-10-CM | POA: Diagnosis present

## 2022-07-29 DIAGNOSIS — Z923 Personal history of irradiation: Secondary | ICD-10-CM

## 2022-07-29 DIAGNOSIS — E119 Type 2 diabetes mellitus without complications: Secondary | ICD-10-CM | POA: Diagnosis present

## 2022-07-29 DIAGNOSIS — M25511 Pain in right shoulder: Secondary | ICD-10-CM | POA: Diagnosis not present

## 2022-07-29 DIAGNOSIS — Z87891 Personal history of nicotine dependence: Secondary | ICD-10-CM

## 2022-07-29 DIAGNOSIS — Z9221 Personal history of antineoplastic chemotherapy: Secondary | ICD-10-CM

## 2022-07-29 DIAGNOSIS — T80211A Bloodstream infection due to central venous catheter, initial encounter: Secondary | ICD-10-CM | POA: Diagnosis not present

## 2022-07-29 DIAGNOSIS — Z880 Allergy status to penicillin: Secondary | ICD-10-CM

## 2022-07-29 DIAGNOSIS — Y831 Surgical operation with implant of artificial internal device as the cause of abnormal reaction of the patient, or of later complication, without mention of misadventure at the time of the procedure: Secondary | ICD-10-CM | POA: Diagnosis present

## 2022-07-29 DIAGNOSIS — Z8616 Personal history of COVID-19: Secondary | ICD-10-CM

## 2022-07-29 DIAGNOSIS — Z1152 Encounter for screening for COVID-19: Secondary | ICD-10-CM

## 2022-07-29 DIAGNOSIS — R6510 Systemic inflammatory response syndrome (SIRS) of non-infectious origin without acute organ dysfunction: Secondary | ICD-10-CM

## 2022-07-29 DIAGNOSIS — Z853 Personal history of malignant neoplasm of breast: Secondary | ICD-10-CM

## 2022-07-29 DIAGNOSIS — I5020 Unspecified systolic (congestive) heart failure: Secondary | ICD-10-CM | POA: Diagnosis not present

## 2022-07-29 DIAGNOSIS — C773 Secondary and unspecified malignant neoplasm of axilla and upper limb lymph nodes: Secondary | ICD-10-CM

## 2022-07-29 DIAGNOSIS — I10 Essential (primary) hypertension: Secondary | ICD-10-CM | POA: Diagnosis present

## 2022-07-29 DIAGNOSIS — Z881 Allergy status to other antibiotic agents status: Secondary | ICD-10-CM

## 2022-07-29 DIAGNOSIS — I110 Hypertensive heart disease with heart failure: Secondary | ICD-10-CM | POA: Diagnosis present

## 2022-07-29 DIAGNOSIS — R7881 Bacteremia: Secondary | ICD-10-CM | POA: Diagnosis present

## 2022-07-29 DIAGNOSIS — D696 Thrombocytopenia, unspecified: Secondary | ICD-10-CM | POA: Diagnosis present

## 2022-07-29 DIAGNOSIS — C50911 Malignant neoplasm of unspecified site of right female breast: Secondary | ICD-10-CM | POA: Diagnosis present

## 2022-07-29 DIAGNOSIS — I5022 Chronic systolic (congestive) heart failure: Secondary | ICD-10-CM | POA: Diagnosis present

## 2022-07-29 DIAGNOSIS — R6884 Jaw pain: Secondary | ICD-10-CM | POA: Diagnosis not present

## 2022-07-29 DIAGNOSIS — Z888 Allergy status to other drugs, medicaments and biological substances status: Secondary | ICD-10-CM

## 2022-07-29 DIAGNOSIS — T80212A Local infection due to central venous catheter, initial encounter: Secondary | ICD-10-CM | POA: Diagnosis present

## 2022-07-29 DIAGNOSIS — B955 Unspecified streptococcus as the cause of diseases classified elsewhere: Secondary | ICD-10-CM | POA: Diagnosis present

## 2022-07-29 DIAGNOSIS — B950 Streptococcus, group A, as the cause of diseases classified elsewhere: Secondary | ICD-10-CM | POA: Diagnosis present

## 2022-07-29 DIAGNOSIS — E871 Hypo-osmolality and hyponatremia: Secondary | ICD-10-CM | POA: Diagnosis present

## 2022-07-29 LAB — COMPREHENSIVE METABOLIC PANEL
ALT: 33 U/L (ref 0–44)
AST: 36 U/L (ref 15–41)
Albumin: 3.9 g/dL (ref 3.5–5.0)
Alkaline Phosphatase: 102 U/L (ref 38–126)
Anion gap: 8 (ref 5–15)
BUN: 10 mg/dL (ref 8–23)
CO2: 26 mmol/L (ref 22–32)
Calcium: 8.8 mg/dL — ABNORMAL LOW (ref 8.9–10.3)
Chloride: 96 mmol/L — ABNORMAL LOW (ref 98–111)
Creatinine, Ser: 0.72 mg/dL (ref 0.44–1.00)
GFR, Estimated: >60 mL/min (ref >60)
Glucose, Bld: 111 mg/dL — ABNORMAL HIGH (ref 70–99)
Potassium: 4.2 mmol/L (ref 3.5–5.1)
Sodium: 130 mmol/L — ABNORMAL LOW (ref 135–145)
Total Bilirubin: 0.6 mg/dL (ref 0.3–1.2)
Total Protein: 9.8 g/dL — ABNORMAL HIGH (ref 6.5–8.1)

## 2022-07-29 LAB — CBC WITH DIFFERENTIAL/PLATELET
Abs Immature Granulocytes: 0.21 10*3/uL — ABNORMAL HIGH (ref 0.00–0.07)
Basophils Absolute: 0 10*3/uL (ref 0.0–0.1)
Basophils Relative: 0 %
Eosinophils Absolute: 0 10*3/uL (ref 0.0–0.5)
Eosinophils Relative: 0 %
HCT: 33.1 % — ABNORMAL LOW (ref 36.0–46.0)
Hemoglobin: 10.5 g/dL — ABNORMAL LOW (ref 12.0–15.0)
Immature Granulocytes: 3 %
Lymphocytes Relative: 26 %
Lymphs Abs: 2 10*3/uL (ref 0.7–4.0)
MCH: 29.2 pg (ref 26.0–34.0)
MCHC: 31.7 g/dL (ref 30.0–36.0)
MCV: 92.2 fL (ref 80.0–100.0)
Monocytes Absolute: 0.6 10*3/uL (ref 0.1–1.0)
Monocytes Relative: 7 %
Neutro Abs: 4.9 10*3/uL (ref 1.7–7.7)
Neutrophils Relative %: 64 %
Platelets: 106 10*3/uL — ABNORMAL LOW (ref 150–400)
RBC: 3.59 MIL/uL — ABNORMAL LOW (ref 3.87–5.11)
RDW: 15.6 % — ABNORMAL HIGH (ref 11.5–15.5)
WBC: 7.7 10*3/uL (ref 4.0–10.5)
nRBC: 0 % (ref 0.0–0.2)

## 2022-07-29 LAB — URINALYSIS, ROUTINE W REFLEX MICROSCOPIC
Bacteria, UA: NONE SEEN
Bilirubin Urine: NEGATIVE
Glucose, UA: 150 mg/dL — AB
Hgb urine dipstick: NEGATIVE
Ketones, ur: NEGATIVE mg/dL
Leukocytes,Ua: NEGATIVE
Nitrite: NEGATIVE
Protein, ur: 100 mg/dL — AB
Specific Gravity, Urine: 1.013 (ref 1.005–1.030)
pH: 8 (ref 5.0–8.0)

## 2022-07-29 LAB — LACTIC ACID, PLASMA: Lactic Acid, Venous: 1.1 mmol/L (ref 0.5–1.9)

## 2022-07-29 LAB — RESP PANEL BY RT-PCR (RSV, FLU A&B, COVID)  RVPGX2
Influenza A by PCR: NEGATIVE
Influenza B by PCR: NEGATIVE
Resp Syncytial Virus by PCR: NEGATIVE
SARS Coronavirus 2 by RT PCR: NEGATIVE

## 2022-07-29 LAB — BRAIN NATRIURETIC PEPTIDE: B Natriuretic Peptide: 73.1 pg/mL (ref 0.0–100.0)

## 2022-07-29 LAB — TROPONIN I (HIGH SENSITIVITY)
Troponin I (High Sensitivity): 7 ng/L (ref <18)
Troponin I (High Sensitivity): 8 ng/L (ref <18)

## 2022-07-29 MED ORDER — SODIUM CHLORIDE 0.9 % IV SOLN
2.0000 g | Freq: Three times a day (TID) | INTRAVENOUS | Status: DC
  Administered 2022-07-30 (×2): 2 g via INTRAVENOUS
  Filled 2022-07-29 (×2): qty 12.5

## 2022-07-29 MED ORDER — CARVEDILOL 12.5 MG PO TABS
12.5000 mg | ORAL_TABLET | Freq: Once | ORAL | Status: AC
  Administered 2022-07-29: 12.5 mg via ORAL

## 2022-07-29 MED ORDER — LACTATED RINGERS IV BOLUS
500.0000 mL | Freq: Once | INTRAVENOUS | Status: AC
  Administered 2022-07-29: 500 mL via INTRAVENOUS

## 2022-07-29 MED ORDER — VANCOMYCIN HCL 1750 MG/350ML IV SOLN
1750.0000 mg | INTRAVENOUS | Status: DC
  Filled 2022-07-29: qty 350

## 2022-07-29 MED ORDER — SPIRONOLACTONE 25 MG PO TABS
25.0000 mg | ORAL_TABLET | Freq: Once | ORAL | Status: AC
  Administered 2022-07-29: 25 mg via ORAL
  Filled 2022-07-29: qty 1

## 2022-07-29 MED ORDER — IOHEXOL 350 MG/ML SOLN: 75.0000 mL | Freq: Once | INTRAVENOUS | Status: DC | PRN

## 2022-07-29 MED ORDER — IBUPROFEN 200 MG PO TABS
400.0000 mg | ORAL_TABLET | Freq: Once | ORAL | Status: AC
  Administered 2022-07-29: 400 mg via ORAL
  Filled 2022-07-29: qty 2

## 2022-07-29 MED ORDER — IOHEXOL 350 MG/ML SOLN
80.0000 mL | Freq: Once | INTRAVENOUS | Status: AC | PRN
  Administered 2022-07-29: 80 mL via INTRAVENOUS

## 2022-07-29 MED ORDER — HEPARIN SOD (PORK) LOCK FLUSH 100 UNIT/ML IV SOLN
500.0000 [IU] | Freq: Once | INTRAVENOUS | Status: AC
  Administered 2022-07-29: 500 [IU]
  Filled 2022-07-29: qty 5

## 2022-07-29 MED ORDER — ONDANSETRON HCL 4 MG/2ML IJ SOLN: 4.0000 mg | Freq: Four times a day (QID) | INTRAMUSCULAR | Status: DC | PRN

## 2022-07-29 MED ORDER — SODIUM CHLORIDE 0.9 % IV SOLN: INTRAVENOUS | Status: DC

## 2022-07-29 MED ORDER — VANCOMYCIN HCL 1500 MG/300ML IV SOLN
1500.0000 mg | Freq: Once | INTRAVENOUS | Status: AC
  Administered 2022-07-29: 1500 mg via INTRAVENOUS
  Filled 2022-07-29: qty 300

## 2022-07-29 MED ORDER — ACETAMINOPHEN 650 MG RE SUPP: 650.0000 mg | Freq: Four times a day (QID) | RECTAL | Status: DC | PRN

## 2022-07-29 MED ORDER — ACETAMINOPHEN 500 MG PO TABS
1000.0000 mg | ORAL_TABLET | Freq: Once | ORAL | Status: AC
  Administered 2022-07-29: 1000 mg via ORAL
  Filled 2022-07-29: qty 2

## 2022-07-29 MED ORDER — PANTOPRAZOLE SODIUM 40 MG PO TBEC
40.0000 mg | DELAYED_RELEASE_TABLET | Freq: Every day | ORAL | Status: DC
  Administered 2022-07-30 – 2022-08-04 (×6): 40 mg via ORAL
  Filled 2022-07-29 (×6): qty 1

## 2022-07-29 MED ORDER — MELATONIN 5 MG PO TABS
10.0000 mg | ORAL_TABLET | Freq: Every evening | ORAL | Status: DC | PRN
  Administered 2022-08-01 – 2022-08-03 (×3): 10 mg via ORAL
  Filled 2022-07-29 (×3): qty 2

## 2022-07-29 MED ORDER — ONDANSETRON HCL 4 MG PO TABS: 4.0000 mg | ORAL_TABLET | Freq: Four times a day (QID) | ORAL | Status: DC | PRN

## 2022-07-29 MED ORDER — CIPROFLOXACIN HCL 500 MG PO TABS
500.0000 mg | ORAL_TABLET | Freq: Once | ORAL | Status: AC
  Administered 2022-07-29: 500 mg via ORAL
  Filled 2022-07-29: qty 1

## 2022-07-29 MED ORDER — ACETAMINOPHEN 325 MG PO TABS
650.0000 mg | ORAL_TABLET | Freq: Four times a day (QID) | ORAL | Status: DC | PRN
  Administered 2022-07-30 – 2022-08-04 (×12): 650 mg via ORAL
  Filled 2022-07-29 (×12): qty 2

## 2022-07-29 MED ORDER — ACETAMINOPHEN 325 MG PO TABS: 650.0000 mg | ORAL_TABLET | Freq: Once | ORAL | Status: DC | PRN

## 2022-07-29 MED ORDER — CARVEDILOL 12.5 MG PO TABS
12.5000 mg | ORAL_TABLET | Freq: Two times a day (BID) | ORAL | Status: DC
  Filled 2022-07-29: qty 1

## 2022-07-29 MED ORDER — LACTATED RINGERS IV BOLUS: 1000.0000 mL | Freq: Once | INTRAVENOUS | Status: DC

## 2022-07-29 MED ORDER — CARVEDILOL 12.5 MG PO TABS
12.5000 mg | ORAL_TABLET | Freq: Two times a day (BID) | ORAL | Status: DC
  Administered 2022-07-30 – 2022-08-04 (×11): 12.5 mg via ORAL
  Filled 2022-07-29 (×12): qty 1

## 2022-07-29 MED ORDER — SODIUM CHLORIDE 0.9 % IV SOLN
2.0000 g | Freq: Once | INTRAVENOUS | Status: AC
  Administered 2022-07-29: 2 g via INTRAVENOUS
  Filled 2022-07-29: qty 12.5

## 2022-07-29 NOTE — ED Notes (Signed)
Patient transported to CT 

## 2022-07-29 NOTE — Progress Notes (Signed)
Pharmacy Antibiotic Note  Marie Church is a 67 y.o. female admitted on 07/29/2022 with  infected port-A-cath .  Pharmacy has been consulted for Cefepime & Vancomycin dosing.  Plan: Cefepime 2gm IV q8h Vancomycin '1750mg'$  IV q24h to target AUC 400-550 Estimated AUC on this regimen: 516 Monitor renal function and cx data   Height: '5\' 6"'$  (167.6 cm) Weight: 79 kg (174 lb 2.6 oz) IBW/kg (Calculated) : 59.3  Temp (24hrs), Avg:102.2 F (39 C), Min:100.9 F (38.3 C), Max:103.2 F (39.6 C)  Recent Labs  Lab 07/29/22 1707  WBC 7.7  CREATININE 0.72  LATICACIDVEN 1.1    Estimated Creatinine Clearance: 75.4 mL/min (by C-G formula based on SCr of 0.72 mg/dL).    Allergies  Allergen Reactions   Doxycycline Rash   Amoxicillin-Pot Clavulanate Rash    Tolerates Rocephin   Lisinopril Rash    Antimicrobials this admission: 3/13 Cefepime >> 3/13 Vancomycin >>   Dose adjustments this admission:  Microbiology results: 3/13 BCx:   Thank you for allowing pharmacy to be a part of this patient's care.  Netta Cedars PharmD 07/29/2022 11:14 PM

## 2022-07-29 NOTE — Assessment & Plan Note (Signed)
Stable. Pt has been living in [place] for about 2 years. Has been getting refills on some meds from old PCP in [place]. Is only taking coreg and farxiga at present time. Continue with coreg.

## 2022-07-29 NOTE — Assessment & Plan Note (Signed)
Last echo 02-2021 with EF of 50%. Has run out of entresto. Pt evasive on how long it has been since she took entresto. Only has coreg and farxiga in her bag of meds. 90 day supply of coreg was from October 2023.

## 2022-07-29 NOTE — Assessment & Plan Note (Addendum)
Improved slightly. Chronic. Platelets 117 today. Hematology to follow as outpatient. No signs of bleeding or petechia.

## 2022-07-29 NOTE — Assessment & Plan Note (Signed)
Chronic. Follows with outpatient [facility] oncology in [place], [place].

## 2022-07-29 NOTE — Progress Notes (Signed)
A consult was received from an ED physician for vancomycin per pharmacy dosing.  The patient's profile has been reviewed for ht/wt/allergies/indication/available labs.    A one time order has been placed for vancomycin 1500 mg IV x1.  Further antibiotics/pharmacy consults should be ordered by admitting physician if indicated.                       Thank you, Lynelle Doctor 07/29/2022  5:34 PM

## 2022-07-29 NOTE — ED Triage Notes (Signed)
Pt bib ems for increased weakness and chills x 5 days. Also complains of cough and decreased appetite. Denies n/v/d. Pt complains of bilateral leg pain. Pt had COVID 2 weeks ago. Pt took tylenol cold at 12pm today.

## 2022-07-29 NOTE — ED Notes (Signed)
Pt. Placed on external female catheter hooked up to suction cannister.

## 2022-07-29 NOTE — Assessment & Plan Note (Addendum)
Resolved. Na today was 135.

## 2022-07-29 NOTE — Assessment & Plan Note (Addendum)
Observation med/surg bed. CT head/chest/abd/pelvis negative for source of fever. UA negative. Pt does have port-a-cath that she uses for q3 month chemo that she received via [facility] in [place], [place]. Will obtain blood cx via port-a-cath. Continue with cefepime and vanco for now. Pt aware that if port-a-cath is infected, this will require removal and she will need to contact her oncologist at [facility] to have a new port-a-cath placed for her next chemo session. RVP was negative. Initially one blood culture only was obtained. It appears that this was obtained peripherally. It was positive for strep pyogenes. ID was consulted, and the port was removed. TTE was negative for endocarditis or valvular vegetations.  The patient is receiving ancef 2 gm q 8 hours. Await ID for duration of therapy. Likely will need to have PICC placed tomorrow. Port was removed on 07/30/2022. I believe that that will make today Day #4 of therapy. Repeat blood culture drawn from port on 07/30/2022 have had no growth.

## 2022-07-29 NOTE — ED Provider Notes (Signed)
[place] EMERGENCY DEPARTMENT AT [facility] Provider Note   CSN: [id number] Arrival date & time: 07/29/22  1618     History  Chief Complaint  Patient presents with   Weakness    Marie Church is a 66 y.o. female.  With PMH of HTN, DM2, breast cancer status post XRT and lumpectomy in remission presenting with generalized fatigue and weakness with fever and cough over the past 5 days.  Patient had COVID about 2 to 3 weeks ago.  Over the past 5 days she has been feeling unwell.  She has had a cough productive of green sputum with fevers and chills.  She also notes lower abdominal pain and going to the bathroom more often than usual.  No burning with urination.  She has had decreased appetite but no nausea, no vomiting.  She denies any headache.  She denies any chest pain did but does endorse worsening shortness of breath on exertion.  Denies any weight gain or increased swelling in the extremities.  She is run out of her [place] and and entresto and has not been taking it for the past couple weeks.  She still has a port.  It is still intermittently accessed.  She denies any redness warmth or pain to that region.   Weakness      Home Medications Prior to Admission medications   Medication Sig Start Date End Date Taking? Authorizing Provider  acetaminophen (TYLENOL) 500 MG tablet Take 1,000 mg by mouth every 6 (six) hours as needed for moderate pain or headache.    [provider]  azithromycin (ZITHROMAX) 250 MG tablet Take 1 tablet (250 mg total) by mouth daily. Take first 2 tablets together, then 1 every day until finished. 03/04/22   Fredia Sorrow, MD  carvedilol (COREG) 12.5 MG tablet Take 1 tablet (12.5 mg total) by mouth 2 (two) times daily with a meal. 04/15/21   Shelly Coss, MD  clindamycin (CLEOCIN) 150 MG capsule Take 1 capsule (150 mg total) by mouth 3 (three) times daily. 06/07/22   Malvin Johns, MD  Dexlansoprazole 30 MG capsule Take 30 mg by  mouth daily as needed (for acid reflux).    [provider]  diclofenac Sodium (VOLTAREN) 1 % GEL Apply 4 g topically 4 (four) times daily. 06/07/22   Malvin Johns, MD  furosemide (LASIX) 40 MG tablet Take 0.5 tablets (20 mg total) by mouth daily. 01/08/21   Larey Dresser, MD  guaiFENesin-dextromethorphan [facility] DM) 100-10 MG/5ML syrup Take 5 mLs by mouth every 4 (four) hours as needed for cough. 04/15/21   Shelly Coss, MD  mirtazapine (REMERON) 7.5 MG tablet Take 1 tablet (7.5 mg total) by mouth at bedtime. Patient not taking: Reported on 05/22/2021 04/15/21   Shelly Coss, MD  potassium chloride SA (KLOR-CON M) 20 MEQ tablet Take 1 tablet (20 mEq total) by mouth daily. Patient not taking: Reported on 05/22/2021 04/15/21   Shelly Coss, MD  sacubitril-valsartan (ENTRESTO) 24-26 MG Take 1 tablet by mouth 2 (two) times daily. 10/28/20   Almyra Deforest, PA  spironolactone (ALDACTONE) 25 MG tablet Take 0.5 tablets (12.5 mg total) by mouth daily. Patient not taking: Reported on 05/22/2021 11/07/20   Croitoru, Dani Gobble, MD      Allergies    Doxycycline, Amoxicillin-pot clavulanate, and Lisinopril    Review of Systems   Review of Systems  Neurological:  Positive for weakness.    Physical Exam Updated Vital Signs BP (!) 203/105   Pulse Marland Kitchen)  114   Temp (!) 103.2 F (39.6 C) (Oral)   Resp (!) 31   Ht '5\' 6"'$  (1.676 m)   Wt 79 kg   SpO2 95%   BMI 28.11 kg/m  Physical Exam Constitutional: Alert and oriented. Somewhat somnolent but arouses to voice, fatigued appearing Eyes: Conjunctivae are normal. ENT      Head: Normocephalic and atraumatic. Cardiovascular: S1, S2, tachycardic, regular rhythm, warm well-perfused and equal distal DP and radial pulses Respiratory: Tachypneic, breath sounds clear, O2 sat 95 on RA Gastrointestinal: Soft and suprapubic tenderness no rebound or guarding Musculoskeletal: . No pitting edema of lower extremities Neurologic: Normal speech and language.   Pupils equal bilaterally.  No facial droop.  5 out of 5 strength bilateral upper extremities.  Mildly decreased strength of right lower extremity due to pain . skin: Skin is warm, dry and intact. No rash noted. Psychiatric: Mood and affect are normal. Speech and behavior are normal.  ED Results / Procedures / Treatments   Labs (all labs ordered are listed, but only abnormal results are displayed) Labs Reviewed  CBC WITH DIFFERENTIAL/PLATELET - Abnormal; Notable for the following components:      Result Value   RBC 3.59 (*)    Hemoglobin 10.5 (*)    HCT 33.1 (*)    RDW 15.6 (*)    Platelets 106 (*)    Abs Immature Granulocytes 0.21 (*)    All other components within normal limits  RESP PANEL BY RT-PCR (RSV, FLU A&B, COVID)  RVPGX2  CULTURE, BLOOD (ROUTINE X 2)  CULTURE, BLOOD (ROUTINE X 2)  LACTIC ACID, PLASMA  LACTIC ACID, PLASMA  COMPREHENSIVE METABOLIC PANEL  URINALYSIS, ROUTINE W REFLEX MICROSCOPIC  BRAIN NATRIURETIC PEPTIDE  TROPONIN I (HIGH SENSITIVITY)    EKG EKG Interpretation  Date/Time:  Wednesday July 29 2022 16:30:39 EDT Ventricular Rate:  113 PR Interval:  193 QRS Duration: 75 QT Interval:  311 QTC Calculation: 427 R Axis:   56 Text Interpretation: Sinus tachycardia Confirmed by Georgina Snell [phone]) on 07/29/2022 4:35:18 PM  Radiology DG Chest 2 View  Result Date: 07/29/2022 CLINICAL DATA:  Weakness EXAM: CHEST - 2 VIEW COMPARISON:  Chest x-ray 06/06/2022 FINDINGS: Left chest port catheter tip projects over the SVC, unchanged. The heart size and mediastinal contours are within normal limits. Both lungs are clear. The visualized skeletal structures are unremarkable. IMPRESSION: No active cardiopulmonary disease. Electronically Signed   By: Ronney Asters M.D.   On: 07/29/2022 17:06    Procedures Procedures  {Document cardiac monitor, telemetry assessment procedure when appropriate:1}  Medications Ordered in ED Medications  ceFEPIme (MAXIPIME) 2 g in  sodium chloride 0.9 % 100 mL IVPB (has no administration in time range)  vancomycin (VANCOREADY) IVPB 1500 mg/300 mL (has no administration in time range)  acetaminophen (TYLENOL) tablet 1,000 mg (1,000 mg Oral Given 07/29/22 1657)  lactated ringers bolus 500 mL (500 mLs Intravenous New Bag/Given 07/29/22 1707)    ED Course/ Medical Decision Making/ A&P                            Medical Decision Making Amount and/or Complexity of Data Reviewed Labs: ordered. Radiology: ordered.  Risk OTC drugs. Prescription drug management.   ***  Final Clinical Impression(s) / ED Diagnoses Final diagnoses:  None    Rx / DC Orders ED Discharge Orders     None

## 2022-07-29 NOTE — Subjective & Objective (Signed)
CC: fever and chills x 4-5 days HPI: 67 year old African-American female history of breast cancer on the right metastasized to the axillary lymph nodes, history of chronic systolic heart failure EF of 50%, hypertension who presents to the ER today with 4 to 5 days of reported fever and chills.  She had COVID on 07-10-2022.  She is continued with some upper respiratory symptoms.  She has had some fever with abdominal pain.  No diarrhea.  She has not found a primary care doctor even though she has been in [place] now for about 2 years.  She continues to go see her oncologist in [place] with [facility] for her breast cancer.  She still is a Port-A-Cath.  She has been getting chemo reportedly every 3 months through oncology.  On arrival temp 103.2 heart rate 113 blood pressure 180/24 satting 96% on room air.  After some Tylenol fever decreased to 100.9, heart rate 97, blood pressure 110/65 satting 96% on room air.  Labs showed a urinalysis specific gravity 1.013, negative nitrate, negative leukocyte esterase  Sodium 130, potassium 4.2, BUN of 10, creatinine 0.72  Total protein 10.8, albumin 3.9 Lactic acid 1.1  White count 7.7, hemoglobin 10.5, platelets of 106  CT head/chest/abd/pelvis negative for source of her fever.

## 2022-07-29 NOTE — H&P (Signed)
History and Physical    ZAMIRA LEHRMANN [id number] DOB: 1955-12-28 DOA: 07/29/2022  DOS: the patient was seen and examined on 07/29/2022  PCP: Pcp, No   Patient coming from: Home  I have personally briefly reviewed patient's old medical records in [place]  CC: fever and chills x 4-5 days HPI: 67 year old African-American female history of breast cancer on the right metastasized to the axillary lymph nodes, history of chronic systolic heart failure EF of 50%, hypertension who presents to the ER today with 4 to 5 days of reported fever and chills.  She had COVID on 07-10-2022.  She is continued with some upper respiratory symptoms.  She has had some fever with abdominal pain.  No diarrhea.  She has not found a primary care doctor even though she has been in [place] now for about 2 years.  She continues to go see her oncologist [place] with [facility] for her breast cancer.  She still is a Port-A-Cath.  She has been getting chemo reportedly every 3 months through oncology.  On arrival temp 103.2 heart rate 113 blood pressure 180/24 satting 96% on room air.  After some Tylenol fever decreased to 100.9, heart rate 97, blood pressure 110/65 satting 96% on room air.  Labs showed a urinalysis specific gravity 1.013, negative nitrate, negative leukocyte esterase  Sodium 130, potassium 4.2, BUN of 10, creatinine 0.72  Total protein 10.8, albumin 3.9 Lactic acid 1.1  White count 7.7, hemoglobin 10.5, platelets of 106  CT head/chest/abd/pelvis negative for source of her fever.      ED Course: febrile to 103.2, HR 113.  Review of Systems:  Review of Systems  Constitutional:  Positive for chills and fever.  HENT:  Positive for congestion.   Eyes: Negative.   Respiratory: Negative.    Cardiovascular: Negative.   Gastrointestinal:  Positive for abdominal pain. Negative for diarrhea.  Genitourinary:  Positive for frequency.  Musculoskeletal: Negative.    Skin: Negative.   Neurological: Negative.   Endo/Heme/Allergies: Negative.   Psychiatric/Behavioral: Negative.    All other systems reviewed and are negative.   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   Hypertension     Past Surgical History:  Procedure Laterality Date   BREAST LUMPECTOMY Right 11/30/2020   BRONCHIAL BIOPSY  04/09/2021   Procedure: BRONCHIAL BIOPSIES;  Surgeon: [name], MD;  Location: [facility];  Service: Pulmonary;;   BRONCHIAL NEEDLE ASPIRATION BIOPSY  04/09/2021   Procedure: BRONCHIAL NEEDLE ASPIRATION BIOPSIES;  Surgeon: [name], MD;  Location: [facility];  Service: Pulmonary;;   BRONCHIAL WASHINGS  04/09/2021   Procedure: BRONCHIAL WASHINGS;  Surgeon: [name], MD;  Location: [facility];  Service: Pulmonary;;   ENDOBRONCHIAL ULTRASOUND N/A 04/09/2021   Procedure: ENDOBRONCHIAL ULTRASOUND;  Surgeon: [name], MD;  Location: [facility];  Service: Pulmonary;  Laterality: N/A;   HEMOSTASIS CONTROL  04/09/2021   Procedure: HEMOSTASIS CONTROL;  Surgeon: [name], MD;  Location: [facility];  Service: Pulmonary;;   RIGHT/LEFT HEART CATH AND CORONARY ANGIOGRAPHY N/A 10/10/2020   Procedure: RIGHT/LEFT HEART CATH AND CORONARY ANGIOGRAPHY;  Surgeon: Jolaine Artist, MD;  Location: [facility];  Service: Cardiovascular;  Laterality: N/A;   VIDEO BRONCHOSCOPY N/A 04/09/2021   Procedure: VIDEO BRONCHOSCOPY WITH FLUORO;  Surgeon: [name], MD;  Location: [facility];  Service: Pulmonary;  Laterality: N/A;     reports that she has quit smoking. Her smoking use included cigarettes. She has never used smokeless tobacco. She reports that she does not drink alcohol and does not use drugs.  Allergies  Allergen Reactions   Doxycycline Rash   Amoxicillin-Pot Clavulanate Rash    Tolerates Rocephin    Lisinopril Rash    Family History  Problem Relation Age of Onset   Heart failure Neg Hx    Heart disease Neg Hx    Heart attack Neg Hx     Prior to Admission medications   Medication Sig Start Date End Date Taking? Authorizing Provider  acetaminophen (TYLENOL) 500 MG tablet Take 1,000 mg by mouth every 6 (six) hours as needed for moderate pain or headache.    [provider]  azithromycin (ZITHROMAX) 250 MG tablet Take 1 tablet (250 mg total) by mouth daily. Take first 2 tablets together, then 1 every day until finished. 03/04/22   Fredia Sorrow, MD  carvedilol (COREG) 12.5 MG tablet Take 1 tablet (12.5 mg total) by mouth 2 (two) times daily with a meal. 04/15/21   Shelly Coss, MD  clindamycin (CLEOCIN) 150 MG capsule Take 1 capsule (150 mg total) by mouth 3 (three) times daily. 06/07/22   Malvin Johns, MD  Dexlansoprazole 30 MG capsule Take 30 mg by mouth daily as needed (for acid reflux).    [provider]  diclofenac Sodium (VOLTAREN) 1 % GEL Apply 4 g topically 4 (four) times daily. 06/07/22   Malvin Johns, MD  furosemide (LASIX) 40 MG tablet Take 0.5 tablets (20 mg total) by mouth daily. 01/08/21   Larey Dresser, MD  guaiFENesin-dextromethorphan [facility] DM) 100-10 MG/5ML syrup Take 5 mLs by mouth every 4 (four) hours as needed for cough. 04/15/21   Shelly Coss, MD  mirtazapine (REMERON) 7.5 MG tablet Take 1 tablet (7.5 mg total) by mouth at bedtime. Patient not taking: Reported on 05/22/2021 04/15/21   Shelly Coss, MD  potassium chloride SA (KLOR-CON M) 20 MEQ tablet Take 1 tablet (20 mEq total) by mouth daily. Patient not taking: Reported on 05/22/2021 04/15/21   Shelly Coss, MD  sacubitril-valsartan (ENTRESTO) 24-26 MG Take 1 tablet by mouth 2 (two) times daily. 10/28/20   Almyra Deforest, PA  spironolactone (ALDACTONE) 25 MG tablet Take 0.5 tablets (12.5 mg total) by mouth daily. Patient not taking: Reported on 05/22/2021 11/07/20   Sanda Klein, MD    Physical Exam: Vitals:   07/29/22 2030 07/29/22 2043 07/29/22 2045 07/29/22 2211  BP: 127/71  110/65   Pulse: 99  97   Resp: (!) 25  (!) 26   Temp:  (!) 102.6 F (39.2 C)  (!) 100.9 F (38.3 C)  TempSrc:  Oral  Oral  SpO2: 96%  96%   Weight:      Height:        Physical Exam Vitals and nursing note reviewed.  Constitutional:      General: She is not in acute distress.    Appearance: Normal appearance. She is obese. She is not ill-appearing, toxic-appearing or diaphoretic.  HENT:     Head: Normocephalic and atraumatic.     Nose: Nose normal.  Eyes:     General: No scleral icterus. Cardiovascular:     Rate and Rhythm: Normal rate and regular rhythm.     Pulses: Normal pulses.  Pulmonary:     Effort: Pulmonary effort is normal.     Breath sounds: Normal  breath sounds.  Abdominal:     General: Abdomen is flat and protuberant. Bowel sounds are normal. There is no distension.     Palpations: Abdomen is soft.     Tenderness: There is no abdominal tenderness.  Musculoskeletal:     Right lower leg: No edema.     Left lower leg: No edema.  Skin:    General: Skin is warm and dry.     Capillary Refill: Capillary refill takes less than 2 seconds.  Neurological:     General: No focal deficit present.     Mental Status: She is alert and oriented to person, place, and time.      Labs on Admission: I have personally reviewed following labs and imaging studies  CBC: Recent Labs  Lab 07/29/22 1707  WBC 7.7  NEUTROABS 4.9  HGB 10.5*  HCT 33.1*  MCV 92.2  PLT [id number]*   Basic Metabolic Panel: Recent Labs  Lab 07/29/22 1707  NA 130*  K 4.2  CL 96*  CO2 26  GLUCOSE 111*  BUN 10  CREATININE 0.72  CALCIUM 8.8*   GFR: Estimated Creatinine Clearance: 75.4 mL/min (by C-G formula based on SCr of 0.72 mg/dL). Liver Function Tests: Recent Labs  Lab 07/29/22 1707  AST 36  ALT 33  ALKPHOS 102  BILITOT 0.6  PROT 9.8*  ALBUMIN 3.9   No results for  input(s): "LIPASE", "AMYLASE" in the last 168 hours. No results for input(s): "AMMONIA" in the last 168 hours. Coagulation Profile: No results for input(s): "INR", "PROTIME" in the last 168 hours. Cardiac Enzymes: Recent Labs  Lab 07/29/22 1750 07/29/22 1936  TROPONINIHS 7 8   BNP (last 3 results) Recent Labs    03/11/22 1620 07/29/22 1707  BNP 7.9 73.1   HbA1C: No results for input(s): "HGBA1C" in the last 72 hours. CBG: No results for input(s): "GLUCAP" in the last 168 hours. Lipid Profile: No results for input(s): "CHOL", "HDL", "LDLCALC", "TRIG", "CHOLHDL", "LDLDIRECT" in the last 72 hours. Thyroid Function Tests: No results for input(s): "TSH", "T4TOTAL", "FREET4", "T3FREE", "THYROIDAB" in the last 72 hours. Anemia Panel: No results for input(s): "VITAMINB12", "FOLATE", "FERRITIN", "TIBC", "IRON", "RETICCTPCT" in the last 72 hours. Urine analysis:    Component Value Date/Time   COLORURINE YELLOW 07/29/2022 1707   APPEARANCEUR CLEAR 07/29/2022 1707   LABSPEC 1.013 07/29/2022 1707   PHURINE 8.0 07/29/2022 1707   GLUCOSEU 150 (A) 07/29/2022 1707   HGBUR NEGATIVE 07/29/2022 1707   BILIRUBINUR NEGATIVE 07/29/2022 1707   KETONESUR NEGATIVE 07/29/2022 1707   PROTEINUR 100 (A) 07/29/2022 1707   NITRITE NEGATIVE 07/29/2022 1707   LEUKOCYTESUR NEGATIVE 07/29/2022 1707    Radiological Exams on Admission: I have personally reviewed images CT ABDOMEN PELVIS WO CONTRAST  Result Date: 07/29/2022 CLINICAL DATA:  Weakness and chills for 5 days, recent COVID, lower abdominal pain EXAM: CT ABDOMEN AND PELVIS WITHOUT CONTRAST TECHNIQUE: Multidetector CT imaging of the abdomen and pelvis was performed following the standard protocol without IV contrast. RADIATION DOSE REDUCTION: This exam was performed according to the departmental dose-optimization program which includes automated exposure control, adjustment of the mA and/or kV according to patient size and/or use of iterative  reconstruction technique. COMPARISON:  04/06/2021 FINDINGS: Lower chest: Please see earlier chest CT describing post radiation changes within the right lung and right breast from known right breast cancer. Hepatobiliary: Unremarkable unenhanced appearance of the liver and gallbladder. Pancreas: Unremarkable unenhanced appearance. Spleen: Unremarkable unenhanced appearance. Adrenals/Urinary Tract: Excreted contrast from earlier CT  chest. No filling defects within the kidneys, ureters, or bladder. The adrenals are unremarkable. Stomach/Bowel: No bowel obstruction or ileus. Diverticulosis of the descending and sigmoid colon with no evidence of acute diverticulitis. Normal appendix right lower quadrant. No bowel wall thickening or inflammatory change. Vascular/Lymphatic: Aortic atherosclerosis. No enlarged abdominal or pelvic lymph nodes. Reproductive: Uterus and bilateral adnexa are unremarkable. Other: No free fluid or free intraperitoneal gas. No abdominal wall hernia. Musculoskeletal: No acute or destructive bony lesions. Reconstructed images demonstrate no additional findings. IMPRESSION: 1. Distal colonic diverticulosis without evidence of acute diverticulitis. 2. No acute intra-abdominal or intrapelvic process. 3.  Aortic Atherosclerosis (ICD10-I70.0). 4. Please see earlier chest CT report describing post therapeutic changes in the right breast and lung from known breast cancer. Electronically Signed   By: Randa Ngo M.D.   On: 07/29/2022 21:27   CT Angio Chest PE W and/or Wo Contrast  Result Date: 07/29/2022 CLINICAL DATA:  Chest pain, shortness of breath, COVID 2 weeks ago right breast cancer * Tracking Code: BO * EXAM: CT ANGIOGRAPHY CHEST WITH CONTRAST TECHNIQUE: Multidetector CT imaging of the chest was performed using the standard protocol during bolus administration of intravenous contrast. Multiplanar CT image reconstructions and MIPs were obtained to evaluate the vascular anatomy. RADIATION DOSE  REDUCTION: This exam was performed according to the departmental dose-optimization program which includes automated exposure control, adjustment of the mA and/or kV according to patient size and/or use of iterative reconstruction technique. CONTRAST:  40m OMNIPAQUE IOHEXOL 350 MG/ML SOLN COMPARISON:  03/11/2022 FINDINGS: Cardiovascular: Examination is somewhat limited by breath motion artifact. Within this limitation, no evidence of pulmonary embolism through the segmental pulmonary arterial level. Normal heart size. No pericardial effusion. Mediastinum/Nodes: Unchanged prominent left axillary lymph nodes. Thyroid gland, trachea, and esophagus demonstrate no significant findings. 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 No pleural effusion or pneumothorax. Upper Abdomen: No acute abnormality. Musculoskeletal: Skin thickening of the right breast with dystrophic calcifications and evidence of prior lumpectomy. No acute osseous findings. Review of the MIP images confirms the above findings. IMPRESSION: 1. Examination is somewhat limited by breath motion artifact. Within this limitation, no evidence of pulmonary embolism through the segmental pulmonary arterial level. 2. 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 3. Skin thickening of the right breast with dystrophic calcifications and evidence of prior lumpectomy. Unchanged prominent left axillary lymph nodes. Electronically Signed   By: ADelanna AhmadiM.D.   On: 07/29/2022 19:33   CT Head Wo Contrast  Result Date: 07/29/2022 CLINICAL DATA:  Hypertension and weakness EXAM: CT HEAD WITHOUT CONTRAST TECHNIQUE: Contiguous axial images were obtained from the base of the skull through the vertex without intravenous contrast. RADIATION DOSE REDUCTION: This exam was performed according to the departmental dose-optimization program which includes automated exposure control, adjustment of the mA and/or kV according to patient  size and/or use of iterative reconstruction technique. COMPARISON:  MRI head 06/07/2022 FINDINGS: Brain: No evidence of acute infarction, hemorrhage, hydrocephalus, extra-axial collection or mass lesion/mass effect. There is stable mild periventricular and deep white matter hypodensity, likely chronic small vessel ischemic change. Vascular: No hyperdense vessel or unexpected calcification. Skull: Normal. Negative for fracture or focal lesion. Sinuses/Orbits: There is mucosal thickening of the maxillary sinuses and ethmoid air cells. There is a small air-fluid level in the right maxillary sinus. Other: Subcutaneous lipoma is partially imaged in the upper right posterior neck measuring 4.9 x 6.4 cm similar to prior. IMPRESSION: 1. No acute intracranial process. 2. Mild  chronic small vessel ischemic change. 3. Paranasal sinus disease with small air-fluid level in the right maxillary sinus. Correlate for acute sinusitis. Electronically Signed   By: Ronney Asters M.D.   On: 07/29/2022 19:21   DG Chest 2 View  Result Date: 07/29/2022 CLINICAL DATA:  Weakness EXAM: CHEST - 2 VIEW COMPARISON:  Chest x-ray 06/06/2022 FINDINGS: Left chest port catheter tip projects over the SVC, unchanged. The heart size and mediastinal contours are within normal limits. Both lungs are clear. The visualized skeletal structures are unremarkable. IMPRESSION: No active cardiopulmonary disease. Electronically Signed   By: Ronney Asters M.D.   On: 07/29/2022 17:06    EKG: My personal interpretation of EKG shows: sinus tachycardia    Assessment/Plan Principal Problem:   Fever Active Problems:   Essential hypertension   HFrEF (heart failure with reduced ejection fraction) (HCC)   Hyponatremia   Breast cancer metastasized to axillary lymph node, right (HCC)   Thrombocytopenia (HCC)    Assessment and Plan: * Fever Observation med/surg bed. CT head/chest/abd/pelvis negative for source of fever. UA negative. Pt does have  port-a-cath that she uses for q3 month chemo that she received via [facility] in [place], [place]. Will obtain blood cx via port-a-cath. Continue with cefepime and vanco for now. Pt aware that if port-a-cath is infected, this will require removal and she will need to contact her oncologist at [facility] to have a new port-a-cath placed for her next chemo session. Will also send RVP to rule out other viral illness.  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 Follows with outpatient [facility] oncology in [place], [place].  Hyponatremia Chronic. Has had sodium of 130 in October 2023. Check TSH. Check serum osm, urine osm, urine electrolytes. Start gentle NS @ 75 ml/hr.  HFrEF (heart failure with reduced ejection fraction) ([place]) Last echo 02-2021 with EF of 50%. Has run out of entresto. Pt evasive on how long it has been since she took entresto. Only has coreg and farxiga in her bag of meds. 90 day supply of coreg was from October 2023.  Essential hypertension Stable. Pt has been living in [place] for about 2 years. Has been getting refills on some meds from old PCP in [place]. Is only taking coreg and farxiga at present time. Continue with coreg.  Thrombocytopenia (HCC) Chronic. Hold lovenox/heparin. Can be addressed with her oncologist in [place], [place] at [facility].   DVT prophylaxis: SCDs Code Status: Full Code Family Communication: no family at bedside  Disposition Plan: return home  Consults called: none  Admission status: Observation, Med-Surg   Kristopher Oppenheim, DO [facility] 07/29/2022, 10:52 PM

## 2022-07-30 ENCOUNTER — Inpatient Hospital Stay (HOSPITAL_COMMUNITY): Payer: BLUE CROSS/BLUE SHIELD

## 2022-07-30 DIAGNOSIS — C50911 Malignant neoplasm of unspecified site of right female breast: Secondary | ICD-10-CM | POA: Diagnosis present

## 2022-07-30 DIAGNOSIS — E876 Hypokalemia: Secondary | ICD-10-CM | POA: Diagnosis present

## 2022-07-30 DIAGNOSIS — Z853 Personal history of malignant neoplasm of breast: Secondary | ICD-10-CM | POA: Diagnosis not present

## 2022-07-30 DIAGNOSIS — E871 Hypo-osmolality and hyponatremia: Secondary | ICD-10-CM | POA: Diagnosis present

## 2022-07-30 DIAGNOSIS — T80211A Bloodstream infection due to central venous catheter, initial encounter: Secondary | ICD-10-CM | POA: Diagnosis present

## 2022-07-30 DIAGNOSIS — C773 Secondary and unspecified malignant neoplasm of axilla and upper limb lymph nodes: Secondary | ICD-10-CM | POA: Diagnosis present

## 2022-07-30 DIAGNOSIS — Y831 Surgical operation with implant of artificial internal device as the cause of abnormal reaction of the patient, or of later complication, without mention of misadventure at the time of the procedure: Secondary | ICD-10-CM | POA: Diagnosis present

## 2022-07-30 DIAGNOSIS — B950 Streptococcus, group A, as the cause of diseases classified elsewhere: Secondary | ICD-10-CM | POA: Diagnosis present

## 2022-07-30 DIAGNOSIS — Z91148 Patient's other noncompliance with medication regimen for other reason: Secondary | ICD-10-CM | POA: Diagnosis not present

## 2022-07-30 DIAGNOSIS — T80212A Local infection due to central venous catheter, initial encounter: Secondary | ICD-10-CM

## 2022-07-30 DIAGNOSIS — Z888 Allergy status to other drugs, medicaments and biological substances status: Secondary | ICD-10-CM | POA: Diagnosis not present

## 2022-07-30 DIAGNOSIS — D696 Thrombocytopenia, unspecified: Secondary | ICD-10-CM | POA: Diagnosis present

## 2022-07-30 DIAGNOSIS — M25511 Pain in right shoulder: Secondary | ICD-10-CM | POA: Diagnosis not present

## 2022-07-30 DIAGNOSIS — B955 Unspecified streptococcus as the cause of diseases classified elsewhere: Secondary | ICD-10-CM | POA: Diagnosis not present

## 2022-07-30 DIAGNOSIS — R7881 Bacteremia: Secondary | ICD-10-CM | POA: Diagnosis present

## 2022-07-30 DIAGNOSIS — Z881 Allergy status to other antibiotic agents status: Secondary | ICD-10-CM | POA: Diagnosis not present

## 2022-07-30 DIAGNOSIS — I110 Hypertensive heart disease with heart failure: Secondary | ICD-10-CM | POA: Diagnosis present

## 2022-07-30 DIAGNOSIS — Z8616 Personal history of COVID-19: Secondary | ICD-10-CM | POA: Diagnosis not present

## 2022-07-30 DIAGNOSIS — Z79899 Other long term (current) drug therapy: Secondary | ICD-10-CM | POA: Diagnosis not present

## 2022-07-30 DIAGNOSIS — I10 Essential (primary) hypertension: Secondary | ICD-10-CM | POA: Diagnosis not present

## 2022-07-30 DIAGNOSIS — Z923 Personal history of irradiation: Secondary | ICD-10-CM | POA: Diagnosis not present

## 2022-07-30 DIAGNOSIS — R509 Fever, unspecified: Secondary | ICD-10-CM | POA: Diagnosis not present

## 2022-07-30 DIAGNOSIS — R6884 Jaw pain: Secondary | ICD-10-CM | POA: Diagnosis not present

## 2022-07-30 DIAGNOSIS — I5022 Chronic systolic (congestive) heart failure: Secondary | ICD-10-CM | POA: Diagnosis present

## 2022-07-30 DIAGNOSIS — Z1152 Encounter for screening for COVID-19: Secondary | ICD-10-CM | POA: Diagnosis not present

## 2022-07-30 DIAGNOSIS — T80212D Local infection due to central venous catheter, subsequent encounter: Secondary | ICD-10-CM | POA: Diagnosis not present

## 2022-07-30 DIAGNOSIS — E119 Type 2 diabetes mellitus without complications: Secondary | ICD-10-CM | POA: Diagnosis present

## 2022-07-30 DIAGNOSIS — Z880 Allergy status to penicillin: Secondary | ICD-10-CM | POA: Diagnosis not present

## 2022-07-30 DIAGNOSIS — Z87891 Personal history of nicotine dependence: Secondary | ICD-10-CM | POA: Diagnosis not present

## 2022-07-30 HISTORY — PX: IR REMOVAL TUN ACCESS W/ PORT W/O FL MOD SED: IMG2290

## 2022-07-30 HISTORY — DX: Local infection due to central venous catheter, initial encounter: T80.212A

## 2022-07-30 HISTORY — DX: Unspecified streptococcus as the cause of diseases classified elsewhere: B95.5

## 2022-07-30 LAB — BLOOD CULTURE ID PANEL (REFLEXED) - BCID2

## 2022-07-30 LAB — RESPIRATORY PANEL BY PCR

## 2022-07-30 LAB — COMPREHENSIVE METABOLIC PANEL
ALT: 27 U/L (ref 0–44)
AST: 29 U/L (ref 15–41)
Albumin: 3.2 g/dL — ABNORMAL LOW (ref 3.5–5.0)
Alkaline Phosphatase: 92 U/L (ref 38–126)
Anion gap: 7 (ref 5–15)
BUN: 10 mg/dL (ref 8–23)
CO2: 24 mmol/L (ref 22–32)
Calcium: 8.3 mg/dL — ABNORMAL LOW (ref 8.9–10.3)
Chloride: 99 mmol/L (ref 98–111)
Creatinine, Ser: 0.75 mg/dL (ref 0.44–1.00)
GFR, Estimated: >60 mL/min (ref >60)
Glucose, Bld: 94 mg/dL (ref 70–99)
Potassium: 3.9 mmol/L (ref 3.5–5.1)
Sodium: 130 mmol/L — ABNORMAL LOW (ref 135–145)
Total Bilirubin: 1.1 mg/dL (ref 0.3–1.2)
Total Protein: 9 g/dL — ABNORMAL HIGH (ref 6.5–8.1)

## 2022-07-30 LAB — MAGNESIUM: Magnesium: 1.6 mg/dL — ABNORMAL LOW (ref 1.7–2.4)

## 2022-07-30 LAB — CBC WITH DIFFERENTIAL/PLATELET
Abs Immature Granulocytes: 0.22 10*3/uL — ABNORMAL HIGH (ref 0.00–0.07)
Basophils Absolute: 0 10*3/uL (ref 0.0–0.1)
Basophils Relative: 0 %
Eosinophils Absolute: 0 10*3/uL (ref 0.0–0.5)
Eosinophils Relative: 0 %
HCT: 32 % — ABNORMAL LOW (ref 36.0–46.0)
Hemoglobin: 10.1 g/dL — ABNORMAL LOW (ref 12.0–15.0)
Immature Granulocytes: 3 %
Lymphocytes Relative: 16 %
Lymphs Abs: 1.4 10*3/uL (ref 0.7–4.0)
MCH: 28.5 pg (ref 26.0–34.0)
MCHC: 31.6 g/dL (ref 30.0–36.0)
MCV: 90.4 fL (ref 80.0–100.0)
Monocytes Absolute: 0.4 10*3/uL (ref 0.1–1.0)
Monocytes Relative: 5 %
Neutro Abs: 6.7 10*3/uL (ref 1.7–7.7)
Neutrophils Relative %: 76 %
Platelets: 103 10*3/uL — ABNORMAL LOW (ref 150–400)
RBC: 3.54 MIL/uL — ABNORMAL LOW (ref 3.87–5.11)
RDW: 15.5 % (ref 11.5–15.5)
WBC: 8.8 10*3/uL (ref 4.0–10.5)
nRBC: 0 % (ref 0.0–0.2)

## 2022-07-30 LAB — NA AND K (SODIUM & POTASSIUM), RAND UR
Potassium Urine: 46 mmol/L
Sodium, Ur: 185 mmol/L

## 2022-07-30 LAB — PROCALCITONIN: Procalcitonin: 0.54 ng/mL

## 2022-07-30 LAB — OSMOLALITY, URINE: Osmolality, Ur: 567 mOsm/kg (ref 300–900)

## 2022-07-30 LAB — OSMOLALITY: Osmolality: 283 mOsm/kg (ref 275–295)

## 2022-07-30 LAB — TSH: TSH: 0.843 u[IU]/mL (ref 0.350–4.500)

## 2022-07-30 MED ORDER — LIDOCAINE-EPINEPHRINE 1 %-1:100000 IJ SOLN
INTRAMUSCULAR | Status: AC
  Administered 2022-07-30: 10 mL via INTRADERMAL
  Filled 2022-07-30: qty 1

## 2022-07-30 MED ORDER — CEFAZOLIN SODIUM-DEXTROSE 2-4 GM/100ML-% IV SOLN
2.0000 g | Freq: Three times a day (TID) | INTRAVENOUS | Status: DC
  Administered 2022-07-30 – 2022-08-03 (×12): 2 g via INTRAVENOUS
  Filled 2022-07-30 (×12): qty 100

## 2022-07-30 NOTE — TOC Initial Note (Signed)
Transition of Care [facility]) - Initial/Assessment Note    Patient Details  Name: Marie Church MRN: [id number] Date of Birth: 1955/07/10  Transition [facility] CM/SW Contact:    Ninfa Meeker, RN Phone Number: 07/30/2022, 3:17 PM  Clinical Narrative:                  Patient is a 67 yr old female from home with fever, has pneumonia. Patient has hx of Breast Ca,receives chemo every 3 months at [facility]. Portacath removed today. Case manager has secured an appointment for patient at [facility] on Thursday, August 20, 2022 at 2:30pm with Geryl Rankins, NP. Placed this appt on AVS.  TOC Team will continue to follow.         Patient Goals and CMS Choice            Expected Discharge Plan and Services   Discharge Planning Services: Follow-up appt scheduled                                          Prior Living Arrangements/Services                       Activities of Daily Living Home Assistive Devices/Equipment: None ADL Screening (condition at time of admission) Patient's cognitive ability adequate to safely complete daily activities?: Yes Is the patient deaf or have difficulty hearing?: No Does the patient have difficulty seeing, even when wearing glasses/contacts?: No Does the patient have difficulty concentrating, remembering, or making decisions?: No Patient able to express need for assistance with ADLs?: Yes Does the patient have difficulty dressing or bathing?: No Independently performs ADLs?: Yes (appropriate for developmental age) Does the patient have difficulty walking or climbing stairs?: No Weakness of Legs: None Weakness of Arms/Hands: None  Permission Sought/Granted                  Emotional Assessment              Admission diagnosis:  Gram-positive bacteremia [R78.81] Fever [R50.9] SIRS (systemic inflammatory response syndrome) ([place]) [R65.10] Fever, unspecified fever cause  [R50.9] Patient Active Problem List   Diagnosis Date Noted   Streptococcal bacteremia 07/30/2022   Port or reservoir infection 07/30/2022   Fever 07/29/2022   Toothache 06/07/2022   Musculoskeletal pain 06/07/2022   Malnutrition of moderate degree 04/07/2021   HFrEF (heart failure with reduced ejection fraction) ([place]) 03/30/2021   Hyponatremia 03/30/2021   Thrombocytopenia ([place]) 03/30/2021   Breast cancer metastasized to axillary lymph node, right ([place]) 11/30/2020   Severe protein-calorie malnutrition ([place]) 08/16/2020   Anemia 07/31/2020   GERD without esophagitis 07/31/2020   Breast cancer ([place]) 04/03/2020   Malignant neoplasm of upper-outer quadrant of right breast in female, estrogen receptor negative ([place]) 03/18/2020   Mediastinal lymphadenopathy 03/12/2020   Macrocytic anemia 02/29/2020   Physical debility 01/30/2020   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, [place] [address] Phone: [phone] Fax: [facility]  [place] [employer], [place] [address] Phone: [phone] Fax: [place], [place] - [address] AT [place] [address] [place] [address] Phone: [phone] Fax: [phone]     Social Determinants of Health (SDOH) Social History: [place]: No Food Insecurity (07/30/2022)  Housing: Low Risk  (07/30/2022)  Transportation Needs: No Transportation Needs (07/30/2022)  Utilities: Not At Risk (07/30/2022)  Financial Resource Strain: Medium Risk (02/04/2021)   Social Connections: Socially Isolated (02/04/2021)  Stress: Stress Concern Present (02/04/2021)  Tobacco Use: Medium Risk (07/30/2022)   SDOH Interventions:     Readmission Risk Interventions    04/02/2021    3:36 PM 10/10/2020    3:23 PM 10/07/2020    2:03 PM  Readmission Risk Prevention Plan  Transportation Screening Complete Complete Complete  PCP or Specialist Appt within 5-7 Days  Complete Complete  Home Care Screening  Complete Complete  Medication Review (RN CM)  Complete Complete  Medication Review ([place]) Complete    PCP or Specialist appointment within 3-5 days of discharge Complete    HRI or [place] Complete    SW Recovery Care/Counseling Consult Complete    [place] Not Applicable

## 2022-07-30 NOTE — ED Notes (Signed)
Per MD will de-access port a cath

## 2022-07-30 NOTE — Significant Event (Signed)
Requested by ED charge nurse to assess porta-cath as they need to obtain blood for culture per Dr. Bridgett Larsson, but no blood return from port. Likewise, patient complained of pain with previous attempts to flush and pull blood. When arrived, port had been de-accessed. No obvious redness, warmth, swelling, or pain to touch at port site observed. Accessed port with 20-g Huber needle per sterile protocol. Immediate blood return and flush without difficulty. Advised to monitor for signs of infection and not infuse anything through port until source of systemic infection known.   Selinda Michaels, RN

## 2022-07-30 NOTE — Progress Notes (Signed)
PROGRESS NOTE  Marie Church  [id number] DOB: [birth year] DOA: 07/29/2022 PCP: Pcp, No   Brief Narrative: Patient is a 67 year old female with history of breast cancer, metastasized to the right axilla lymph node currently in remission, chronic systolic CHF with EF of [id number], hypertension who presented with 4 to 5 days history of fever, chills.  Recent history of covid in February.  Continues to have some upper respiratory symptoms.  She follows with oncology at [facility] for the management of her breast cancer.  Has a Port-A-Cath.  On presentation she was febrile with temperature of 103.2, tachycardic, hypertensive.  UA was not suspicious for UTI.  WC count of 7.7.  CT chest/abdomen/pelvis negative for any source of fever.  Procalcitonin equivocal. Started  broad spectrum  antibiotics with vancomycin and cefepime.  ID consulted.  Now blood cultures showing gram-positive cocci.  Assessment & Plan:  Principal Problem:   Fever Active Problems:   Essential hypertension   HFrEF (heart failure with reduced ejection fraction) (HCC)   Hyponatremia   Breast cancer metastasized to axillary lymph node, right (HCC)   Thrombocytopenia (HCC)  Gram-positive bacteremia: Presented with persistent fever from home.  Tachycardic on arrival.  CT chest/abdomen/pelvis without any source of infection.  Urinalysis not suggestive of UTI.  Has a Chemo-Port. Blood cultures have been sent,now showing gram-positive cocci.Marland Kitchen  Already on vancomycin .  No leukocytosis.  Will check procalcitonin is borderline Will also consult ID for assistance. 2 sets of blood culture sent from peripheral vein, 1 set of blood culture sent from port.  After discussion with ID, we have decided to take the port out.  IR consulted.  She is using the port only for blood draws.  Upper respiratory symptoms: Still has mild upper respiratory symptoms.  She recently had COVID.  COVID, respiratory viral panel negative.   Lung sounds are clear to auscultation this morning.  Remains on room air  Metastatic breast cancer: Metastatic to right axilla lymph node.  Currently in remission.  Follows with oncology.  Not on chemo currently.  Has a Chemo-Port Will recommend follow-up with oncology as an outpatient.  Hyponatremia: Looks like chronic.  Sodium stable at 130.  Chronic systolic congestive heart failure: Currently looks euvolemic.  Last echo on October/2002 showed EF of 50%.  Was previously taking spironolactone, Entresto but apparently ran out.  She might be noncompliant.  Takes Coreg and [place]. We will check new echo, might need to start on Entresto and spironolactone again  Hypertension: Continue Coreg.  Monitor blood pressure  Hypomagnesemia: Supplement with magnesium  Thrombocytopenia: Chronic, likely associated with malignancy        DVT prophylaxis:SCDs Start: 07/29/22 2334     Code Status: Full Code  Family Communication: Called and discussed with son on phone on 3/14  Patient status:Obs  Patient is from :Home  Anticipated discharge [id number]  Estimated DC date: After full workup of bacteremia   Consultants: ID  Procedures: None  Antimicrobials:  Anti-infectives (From admission, onward)    Start     Dose/Rate Route Frequency Ordered Stop   07/30/22 1600  vancomycin (VANCOREADY) IVPB 1750 mg/350 mL        1,750 mg 175 mL/hr over 120 Minutes Intravenous Every 24 hours 07/29/22 2346     07/30/22 0200  ceFEPIme (MAXIPIME) 2 g in sodium chloride 0.9 % 100 mL IVPB        2 g 200 mL/hr over 30 Minutes Intravenous Every 8 hours 07/29/22 2346  07/29/22 2145  ciprofloxacin (CIPRO) tablet 500 mg        500 mg Oral  Once 07/29/22 2142 07/29/22 2314   07/29/22 1745  vancomycin (VANCOREADY) IVPB 1500 mg/300 mL        1,500 mg 150 mL/hr over 120 Minutes Intravenous  Once 07/29/22 1736 07/29/22 2044   07/29/22 1730  ceFEPIme (MAXIPIME) 2 g in sodium chloride 0.9 % 100 mL IVPB        2  g 200 mL/hr over 30 Minutes Intravenous  Once 07/29/22 1727 07/29/22 1828       Subjective: Patient seen and examined at bedside today.  Hemodynamically stable.  Comfortable.  Afebrile when I saw her this morning.  Denies any complaints.  Has some runny nose but denies any cough or shortness of breath.  Objective: Vitals:   07/30/22 0600 07/30/22 0630 07/30/22 0645 07/30/22 0700  BP: (!) 170/81 (!) 159/86 (!) 166/92 (!) 172/97  Pulse: (!) 105 (!) 106 (!) 104 (!) 106  Resp: (!) 21 (!) 23 (!) 24 (!) 21  Temp:    99.9 F (37.7 C)  TempSrc:      SpO2: 95% 95% 97% 97%  Weight:      Height:       No intake or output data in the 24 hours ending 07/30/22 [id number] Filed Weights   07/29/22 1628  Weight: 79 kg    Examination:  General exam: Overall comfortable, not in distress, pleasant female HEENT: PERRL Respiratory system:  no wheezes or crackles, Chemo-Port on the left chest Cardiovascular system: S1 & S2 heard, RRR.  Gastrointestinal system: Abdomen is nondistended, soft and nontender. Central nervous system: Alert and oriented Extremities: No edema, no clubbing ,no cyanosis Skin: No rashes, no ulcers,no icterus     Data Reviewed: I have personally reviewed following labs and imaging studies  CBC: Recent Labs  Lab 07/29/22 1707 07/30/22 0500  WBC 7.7 8.8  NEUTROABS 4.9 6.7  HGB 10.5* 10.1*  HCT 33.1* 32.0*  MCV 92.2 90.4  PLT 106* [id number]*   Basic Metabolic Panel: Recent Labs  Lab 07/29/22 1707 07/30/22 0500  NA 130* 130*  K 4.2 3.9  CL 96* 99  CO2 26 24  GLUCOSE 111* 94  BUN 10 10  CREATININE 0.72 0.75  CALCIUM 8.8* 8.3*  MG  --  1.6*     Recent Results (from the past 240 hour(s))  Resp panel by RT-PCR (RSV, Flu A&B, Covid) Anterior Nasal Swab     Status: None   Collection Time: 07/29/22  5:07 PM   Specimen: Anterior Nasal Swab  Result Value Ref Range Status   SARS Coronavirus 2 by RT PCR NEGATIVE NEGATIVE Final    Comment: (NOTE) SARS-CoV-2 target  nucleic acids are NOT DETECTED.  The SARS-CoV-2 RNA is generally detectable in upper respiratory specimens during the acute phase of infection. The lowest concentration of SARS-CoV-2 viral copies this assay can detect is 138 copies/mL. A negative result does not preclude SARS-Cov-2 infection and should not be used as the sole basis for treatment or other patient management decisions. A negative result may occur with  improper specimen collection/handling, submission of specimen other than nasopharyngeal swab, presence of viral mutation(s) within the areas targeted by this assay, and inadequate number of viral copies(<138 copies/mL). A negative result must be combined with clinical observations, patient history, and epidemiological information. The expected result is Negative.  Fact Sheet for Patients:  EntrepreneurPulse.com.au  Fact Sheet for Healthcare Providers:  IncredibleEmployment.be  This test is no t yet approved or cleared by the [place] and  has been authorized for detection and/or diagnosis of SARS-CoV-2 by FDA under an Emergency Use Authorization (EUA). This EUA will remain  in effect (meaning this test can be used) for the duration of the COVID-19 declaration under Section 564(b)(1) of the Act, 21 U.S.C.section 360bbb-3(b)(1), unless the authorization is terminated  or revoked sooner.       Influenza A by PCR NEGATIVE NEGATIVE Final   Influenza B by PCR NEGATIVE NEGATIVE Final    Comment: (NOTE) The Xpert Xpress SARS-CoV-2/FLU/RSV plus assay is intended as an aid in the diagnosis of influenza from Nasopharyngeal swab specimens and should not be used as a sole basis for treatment. Nasal washings and aspirates are unacceptable for Xpert Xpress SARS-CoV-2/FLU/RSV testing.  Fact Sheet for Patients: EntrepreneurPulse.com.au  Fact Sheet for Healthcare  Providers: IncredibleEmployment.be  This test is not yet approved or cleared by the [place] FDA and has been authorized for detection and/or diagnosis of SARS-CoV-2 by FDA under an Emergency Use Authorization (EUA). This EUA will remain in effect (meaning this test can be used) for the duration of the COVID-19 declaration under Section 564(b)(1) of the Act, 21 U.S.C. section 360bbb-3(b)(1), unless the authorization is terminated or revoked.     Resp Syncytial Virus by PCR NEGATIVE NEGATIVE Final    Comment: (NOTE) Fact Sheet for Patients: EntrepreneurPulse.com.au  Fact Sheet for Healthcare Providers: IncredibleEmployment.be  This test is not yet approved or cleared by the [place] FDA and has been authorized for detection and/or diagnosis of SARS-CoV-2 by FDA under an Emergency Use Authorization (EUA). This EUA will remain in effect (meaning this test can be used) for the duration of the COVID-19 declaration under Section 564(b)(1) of the Act, 21 U.S.C. section 360bbb-3(b)(1), unless the authorization is terminated or revoked.  Performed at [facility], [place] [address] [place], [place] [address]   Respiratory (~20 pathogens) panel by PCR     Status: None   Collection Time: 07/29/22  5:07 PM   Specimen: Nasopharyngeal Swab; Respiratory  Result Value Ref Range Status   Adenovirus NOT DETECTED NOT DETECTED Final   Coronavirus 229E NOT DETECTED NOT DETECTED Final    Comment: (NOTE) The Coronavirus on the Respiratory Panel, DOES NOT test for the novel  Coronavirus (2019 nCoV)    Coronavirus HKU1 NOT DETECTED NOT DETECTED Final   Coronavirus NL63 NOT DETECTED NOT DETECTED Final   Coronavirus OC43 NOT DETECTED NOT DETECTED Final   Metapneumovirus NOT DETECTED NOT DETECTED Final   Rhinovirus / Enterovirus NOT DETECTED NOT DETECTED Final   Influenza A NOT DETECTED NOT DETECTED Final   Influenza B  NOT DETECTED NOT DETECTED Final   Parainfluenza Virus 1 NOT DETECTED NOT DETECTED Final   Parainfluenza Virus 2 NOT DETECTED NOT DETECTED Final   Parainfluenza Virus 3 NOT DETECTED NOT DETECTED Final   Parainfluenza Virus 4 NOT DETECTED NOT DETECTED Final   Respiratory Syncytial Virus NOT DETECTED NOT DETECTED Final   Bordetella pertussis NOT DETECTED NOT DETECTED Final   Bordetella Parapertussis NOT DETECTED NOT DETECTED Final   Chlamydophila pneumoniae NOT DETECTED NOT DETECTED Final   Mycoplasma pneumoniae NOT DETECTED NOT DETECTED Final    Comment: Performed at [facility], [place]. [address] [place], [place] [address]     Radiology Studies: CT ABDOMEN PELVIS WO CONTRAST  Result Date: 07/29/2022 CLINICAL DATA:  Weakness and chills for 5 days, recent COVID, lower  abdominal pain EXAM: CT ABDOMEN AND PELVIS WITHOUT CONTRAST TECHNIQUE: Multidetector CT imaging of the abdomen and pelvis was performed following the standard protocol without IV contrast. RADIATION DOSE REDUCTION: This exam was performed according to the departmental dose-optimization program which includes automated exposure control, adjustment of the mA and/or kV according to patient size and/or use of iterative reconstruction technique. COMPARISON:  04/06/2021 FINDINGS: Lower chest: Please see earlier chest CT describing post radiation changes within the right lung and right breast from known right breast cancer. Hepatobiliary: Unremarkable unenhanced appearance of the liver and gallbladder. Pancreas: Unremarkable unenhanced appearance. Spleen: Unremarkable unenhanced appearance. Adrenals/Urinary Tract: Excreted contrast from earlier CT chest. No filling defects within the kidneys, ureters, or bladder. The adrenals are unremarkable. Stomach/Bowel: No bowel obstruction or ileus. Diverticulosis of the descending and sigmoid colon with no evidence of acute diverticulitis. Normal appendix right lower quadrant. No bowel wall thickening  or inflammatory change. Vascular/Lymphatic: Aortic atherosclerosis. No enlarged abdominal or pelvic lymph nodes. Reproductive: Uterus and bilateral adnexa are unremarkable. Other: No free fluid or free intraperitoneal gas. No abdominal wall hernia. Musculoskeletal: No acute or destructive bony lesions. Reconstructed images demonstrate no additional findings. IMPRESSION: 1. Distal colonic diverticulosis without evidence of acute diverticulitis. 2. No acute intra-abdominal or intrapelvic process. 3.  Aortic Atherosclerosis (ICD10-I70.0). 4. Please see earlier chest CT report describing post therapeutic changes in the right breast and lung from known breast cancer. Electronically Signed   By: Randa Ngo M.D.   On: 07/29/2022 21:27   CT Angio Chest PE W and/or Wo Contrast  Result Date: 07/29/2022 CLINICAL DATA:  Chest pain, shortness of breath, COVID 2 weeks ago right breast cancer * Tracking Code: BO * EXAM: CT ANGIOGRAPHY CHEST WITH CONTRAST TECHNIQUE: Multidetector CT imaging of the chest was performed using the standard protocol during bolus administration of intravenous contrast. Multiplanar CT image reconstructions and MIPs were obtained to evaluate the vascular anatomy. RADIATION DOSE REDUCTION: This exam was performed according to the departmental dose-optimization program which includes automated exposure control, adjustment of the mA and/or kV according to patient size and/or use of iterative reconstruction technique. CONTRAST:  41m OMNIPAQUE IOHEXOL 350 MG/ML SOLN COMPARISON:  03/11/2022 FINDINGS: Cardiovascular: Examination is somewhat limited by breath motion artifact. Within this limitation, no evidence of pulmonary embolism through the segmental pulmonary arterial level. Normal heart size. No pericardial effusion. Mediastinum/Nodes: Unchanged prominent left axillary lymph nodes. Thyroid gland, trachea, and esophagus demonstrate no significant findings. 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 No pleural effusion or pneumothorax. Upper Abdomen: No acute abnormality. Musculoskeletal: Skin thickening of the right breast with dystrophic calcifications and evidence of prior lumpectomy. No acute osseous findings. Review of the MIP images confirms the above findings. IMPRESSION: 1. Examination is somewhat limited by breath motion artifact. Within this limitation, no evidence of pulmonary embolism through the segmental pulmonary arterial level. 2. 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 3. Skin thickening of the right breast with dystrophic calcifications and evidence of prior lumpectomy. Unchanged prominent left axillary lymph nodes. Electronically Signed   By: ADelanna AhmadiM.D.   On: 07/29/2022 19:33   CT Head Wo Contrast  Result Date: 07/29/2022 CLINICAL DATA:  Hypertension and weakness EXAM: CT HEAD WITHOUT CONTRAST TECHNIQUE: Contiguous axial images were obtained from the base of the skull through the vertex without intravenous contrast. RADIATION DOSE REDUCTION: This exam was performed according to the departmental dose-optimization program which includes automated exposure control, adjustment of the mA and/or kV according  to patient size and/or use of iterative reconstruction technique. COMPARISON:  MRI head 06/07/2022 FINDINGS: Brain: No evidence of acute infarction, hemorrhage, hydrocephalus, extra-axial collection or mass lesion/mass effect. There is stable mild periventricular and deep white matter hypodensity, likely chronic small vessel ischemic change. Vascular: No hyperdense vessel or unexpected calcification. Skull: Normal. Negative for fracture or focal lesion. Sinuses/Orbits: There is mucosal thickening of the maxillary sinuses and ethmoid air cells. There is a small air-fluid level in the right maxillary sinus. Other: Subcutaneous lipoma is partially imaged in the upper right posterior neck measuring 4.9 x 6.4 cm similar to prior. IMPRESSION:  1. No acute intracranial process. 2. Mild chronic small vessel ischemic change. 3. Paranasal sinus disease with small air-fluid level in the right maxillary sinus. Correlate for acute sinusitis. Electronically Signed   By: Ronney Asters M.D.   On: 07/29/2022 19:21   DG Chest 2 View  Result Date: 07/29/2022 CLINICAL DATA:  Weakness EXAM: CHEST - 2 VIEW COMPARISON:  Chest x-ray 06/06/2022 FINDINGS: Left chest port catheter tip projects over the SVC, unchanged. The heart size and mediastinal contours are within normal limits. Both lungs are clear. The visualized skeletal structures are unremarkable. IMPRESSION: No active cardiopulmonary disease. Electronically Signed   By: Ronney Asters M.D.   On: 07/29/2022 17:06    Scheduled Meds:  carvedilol  12.5 mg Oral BID WC   pantoprazole  40 mg Oral Daily   Continuous Infusions:  sodium chloride 75 mL/hr at 07/30/22 0027   ceFEPime (MAXIPIME) IV 2 g (07/30/22 0153)   vancomycin       LOS: 0 days   Shelly Coss, MD [facility] P3/14/2024, 8:21 AM

## 2022-07-30 NOTE — Procedures (Signed)
Interventional Radiology Procedure Note  Procedure: Port removal  Findings: Please refer to procedural dictation for full description.  Complications: None immediate  Estimated Blood Loss: < 5 mL  Recommendations: Routine wound care.   Ruthann Cancer, MD

## 2022-07-30 NOTE — Plan of Care (Signed)
Admitted to room 1531. Awake and alert. Oriented to room.  Taken to IR for port removal.

## 2022-07-30 NOTE — Consult Note (Signed)
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) (HCC)   Hyponatremia   Thrombocytopenia (HCC)   Breast cancer metastasized to axillary lymph node, right (HCC)   Antibiotics:  Vancomycin 3/13- Cefepime 3/13- Ciprofloxacin 3/13-   Lines/Hardware:  Assessment 67 year old female with PMH as below including rt breast cancer metastatic to the rt axillary nodes status post lumpectomy 11/2020 followed by re-excision of positive margin 12/2020 s/p chemotherapy  stopped due to thrombocytopenia on close surviellance by Oncology at [facility],  [place], [place] 07/10/22 who presented fevers and chills, weakness etc for last 4-5 days.   Strep pyogenes bacteremia: likely source is left chest port  Low suspicion for endocarditis   Recommendations  Will switch IV abtx to IV cefazolin only given strep pyogenes bactermia  Port needs to be removed  Fu repeat blood cx  Monitor CBC and BMP  D/w ID Pharm D and Primary.   Rest of the management as per the primary team. Please call with questions or concerns.  Thank you for the consult  Rosiland Oz, MD Infectious Disease Physician [facility] for Infectious Disease [address] [place], [place] [address] Phone: [phone]  Fax: [phone]  __________________________________________________________________________________________________________ HPI and Hospital Course: 67 year old female with PMH as below including rt breast cancer metastatic to the rt  axillary nodes status post lumpectomy 11/2020 followed by re-excision of positive margin 12/2020 s/p chemotherapy  stopped due to thrombocytopenia on close surviellance by Oncology at [facility],  [place], [place] 07/10/22 who presented greenish/brownish sputum and decreased appetite. Reported bilateral leg pain,  lower abdominal pain and increased frequency of urination although no dysuria. Complains shortness of breath on exertion but denies nausea, vomiting or diarrhea.   At ED afebrile with tachypnea and tachycardia with presentation concerning for sepsis Remarkable for WBC 7.7 lactate 1.1 Imaging as below Patient was given broad-spectrum antibiotics for concern for sepsis of unclear origin  Daughter at bedside reports she had some cold and congestion 2 weeks ago and also feels like she is having similar symptoms. Reports on and off tenderness in the port, especially when she lays in the left side. Chemotherapy for breast cancer has been on hold for few months now and she states the port is only being used for blood draws. Feels tender at the port on and off esp when she lays in the left side.   ROS: General- Denies loss of appetite and loss of weight, fevers and chills + HEENT - Denies headache, blurry vision, neck pain, sinus pain Chest - Denies any chest pain, SOB or cough CVS- Denies any dizziness/lightheadedness, syncopal attacks, palpitations Abdomen- Denies any nausea, vomiting, abdominal pain, hematochezia and diarrhea Neuro - Denies any weakness, numbness, tingling  sensation Psych - Denies any changes in mood irritability or depressive symptoms GU- Denies any burning, dysuria, hematuria or increased frequency of urination Skin - denies any rashes/lesions MSK - denies any joint pain/swelling or restricted ROM   Past Medical History:  Diagnosis Date   Breast cancer ([place])    DM type 2 (diabetes mellitus, 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   Hypertension    Past Surgical  History:  Procedure Laterality Date   BREAST LUMPECTOMY Right 11/30/2020   BRONCHIAL BIOPSY  04/09/2021   Procedure: BRONCHIAL BIOPSIES;  Surgeon: [name], MD;  Location: [facility];  Service: Pulmonary;;   BRONCHIAL NEEDLE ASPIRATION BIOPSY  04/09/2021   Procedure: BRONCHIAL NEEDLE ASPIRATION BIOPSIES;  Surgeon: [name], MD;  Location: [facility];  Service: Pulmonary;;   BRONCHIAL WASHINGS  04/09/2021   Procedure: BRONCHIAL WASHINGS;  Surgeon: [name], MD;  Location: [facility];  Service: Pulmonary;;   ENDOBRONCHIAL ULTRASOUND N/A 04/09/2021   Procedure: ENDOBRONCHIAL ULTRASOUND;  Surgeon: [name], MD;  Location: [facility];  Service: Pulmonary;  Laterality: N/A;   HEMOSTASIS CONTROL  04/09/2021   Procedure: HEMOSTASIS CONTROL;  Surgeon: [name], MD;  Location: [facility];  Service: Pulmonary;;   RIGHT/LEFT Church CATH AND CORONARY ANGIOGRAPHY N/A 10/10/2020   Procedure: RIGHT/LEFT Church CATH AND CORONARY ANGIOGRAPHY;  Surgeon: Jolaine Artist, MD;  Location: [facility] [name] [facility];  Service: Cardiovascular;  Laterality: N/A;   VIDEO BRONCHOSCOPY N/A 04/09/2021   Procedure: VIDEO BRONCHOSCOPY WITH FLUORO;  Surgeon: [name], MD;  Location: [facility];  Service: Pulmonary;  Laterality: N/A;    Scheduled Meds:  carvedilol  12.5 mg Oral BID WC   pantoprazole  40 mg Oral Daily   Continuous Infusions:  sodium chloride 75 mL/hr at 07/30/22 0027   ceFEPime (MAXIPIME) IV Stopped (07/30/22 [id number])   vancomycin     PRN Meds:.acetaminophen **OR** acetaminophen, melatonin, ondansetron **OR** ondansetron (ZOFRAN) IV  Allergies  Allergen Reactions   Doxycycline Rash   Amoxicillin-Pot Clavulanate Rash    Tolerates Rocephin   Lisinopril Rash   Social History   Socioeconomic History   Marital status: Widowed    Spouse name: Not on file   Number of children: Not on file   Years of education: Not on file   Highest  education level: Not on file  Occupational History   Not on file  Tobacco Use   Smoking status: Former    Types: Cigarettes   Smokeless tobacco: Never  Vaping Use   Vaping Use: Never used  Substance and Sexual Activity   Alcohol use: Never   Drug use: Never   Sexual activity: Not on file  Other Topics Concern   Not on file  Social History Narrative   Not on file   Social Determinants of Health   Financial Resource Strain: Medium Risk (02/04/2021)   Overall Financial Resource Strain (CARDIA)    Difficulty of Paying Living Expenses: Somewhat hard  Food Insecurity: Food Insecurity Present (02/04/2021)   Hunger Vital Sign    Worried About [place] in the Last Year: Sometimes true    [place] in the Last Year: Sometimes true  Transportation Needs: Unmet Transportation Needs (02/04/2021)   PRAPARE - Transportation    Lack of Transportation (Medical): Yes    Lack of Transportation (Non-Medical): Yes  Physical Activity: Not on file  Stress: Stress Concern Present (02/04/2021)   [employer] [place] -  Occupational Stress Questionnaire    Feeling of Stress : To some extent  Social Connections: Socially Isolated (02/04/2021)   Social Connection and Isolation Panel [NHANES]    Frequency of Communication with Friends and Family: More than three times a week    Frequency of Social Gatherings with Friends and Family: More than three times a week    Attends Religious Services: Never    Marine scientist or Organizations: No    Attends Music therapist: Never    Marital Status: Divorced  Human resources officer Violence: Not on file   Family History  Problem Relation Age of Onset   Church failure Neg Hx    Church disease Neg Hx    Church attack Neg Hx    Vitals BP 116/65   Pulse 89   Temp 99.9 F (37.7 C)   Resp (!) 21   Ht '5\' 6"'$  (1.676 m)   Wt 79 kg   SpO2 98%   BMI 28.11 kg/m   Physical Exam Constitutional:  elderly black  female lying in the bed and has chills     Comments:   Cardiovascular:     Rate and Rhythm: Normal rate and Irregular rhythm.     Church sounds: Normal Church sounds   Pulmonary:     Effort: Pulmonary effort is normal.     Comments: Normal Church sounds   Abdominal:     Palpations: Abdomen is soft.     Tenderness: non distended and non tender   Musculoskeletal:        General: No swelling or tenderness in peripheral joints. Left chest port with no external signs of infection. No significant tenderness  Skin:    Comments: No lesions or rashes   Neurological:     General: awake, alert and oriented, grossly non focal, follows commands   Psychiatric:        Mood and Affect: Mood normal.    Pertinent Microbiology Results for orders placed or performed during the hospital encounter of 07/29/22  Resp panel by RT-PCR (RSV, Flu A&B, Covid) Anterior Nasal Swab     Status: None   Collection Time: 07/29/22  5:07 PM   Specimen: Anterior Nasal Swab  Result Value Ref Range Status   SARS Coronavirus 2 by RT PCR NEGATIVE NEGATIVE Final    Comment: (NOTE) SARS-CoV-2 target nucleic acids are NOT DETECTED.  The SARS-CoV-2 RNA is generally detectable in upper respiratory specimens during the acute phase of infection. The lowest concentration of SARS-CoV-2 viral copies this assay can detect is 138 copies/mL. A negative result does not preclude SARS-Cov-2 infection and should not be used as the sole basis for treatment or other patient management decisions. A negative result may occur with  improper specimen collection/handling, submission of specimen other than nasopharyngeal swab, presence of viral mutation(s) within the areas targeted by this assay, and inadequate number of viral copies(<138 copies/mL). A negative result must be combined with clinical observations, patient history, and epidemiological information. The expected result is Negative.  Fact Sheet for Patients:   EntrepreneurPulse.com.au  Fact Sheet for Healthcare Providers:  IncredibleEmployment.be  This test is no t yet approved or cleared by the [place] FDA and  has been authorized for detection and/or diagnosis of SARS-CoV-2 by FDA under an Emergency Use Authorization (EUA). This EUA will remain  in effect (meaning this test can be used) for the duration of the COVID-19 declaration under Section 564(b)(1) of the Act, 21 U.S.C.section 360bbb-3(b)(1), unless the authorization  is terminated  or revoked sooner.       Influenza A by PCR NEGATIVE NEGATIVE Final   Influenza B by PCR NEGATIVE NEGATIVE Final    Comment: (NOTE) The Xpert Xpress SARS-CoV-2/FLU/RSV plus assay is intended as an aid in the diagnosis of influenza from Nasopharyngeal swab specimens and should not be used as a sole basis for treatment. Nasal washings and aspirates are unacceptable for Xpert Xpress SARS-CoV-2/FLU/RSV testing.  Fact Sheet for Patients: EntrepreneurPulse.com.au  Fact Sheet for Healthcare Providers: IncredibleEmployment.be  This test is not yet approved or cleared by the [place] FDA and has been authorized for detection and/or diagnosis of SARS-CoV-2 by FDA under an Emergency Use Authorization (EUA). This EUA will remain in effect (meaning this test can be used) for the duration of the COVID-19 declaration under Section 564(b)(1) of the Act, 21 U.S.C. section 360bbb-3(b)(1), unless the authorization is terminated or revoked.     Resp Syncytial Virus by PCR NEGATIVE NEGATIVE Final    Comment: (NOTE) Fact Sheet for Patients: EntrepreneurPulse.com.au  Fact Sheet for Healthcare Providers: IncredibleEmployment.be  This test is not yet approved or cleared by the [place] FDA and has been authorized for detection and/or diagnosis of SARS-CoV-2 by FDA under an Emergency Use  Authorization (EUA). This EUA will remain in effect (meaning this test can be used) for the duration of the COVID-19 declaration under Section 564(b)(1) of the Act, 21 U.S.C. section 360bbb-3(b)(1), unless the authorization is terminated or revoked.  Performed at [facility], [place] [address] [place], [place] [address]   Respiratory (~20 pathogens) panel by PCR     Status: None   Collection Time: 07/29/22  5:07 PM   Specimen: Nasopharyngeal Swab; Respiratory  Result Value Ref Range Status   Adenovirus NOT DETECTED NOT DETECTED Final   Coronavirus 229E NOT DETECTED NOT DETECTED Final    Comment: (NOTE) The Coronavirus on the Respiratory Panel, DOES NOT test for the novel  Coronavirus (2019 nCoV)    Coronavirus HKU1 NOT DETECTED NOT DETECTED Final   Coronavirus NL63 NOT DETECTED NOT DETECTED Final   Coronavirus OC43 NOT DETECTED NOT DETECTED Final   Metapneumovirus NOT DETECTED NOT DETECTED Final   Rhinovirus / Enterovirus NOT DETECTED NOT DETECTED Final   Influenza A NOT DETECTED NOT DETECTED Final   Influenza B NOT DETECTED NOT DETECTED Final   Parainfluenza Virus 1 NOT DETECTED NOT DETECTED Final   Parainfluenza Virus 2 NOT DETECTED NOT DETECTED Final   Parainfluenza Virus 3 NOT DETECTED NOT DETECTED Final   Parainfluenza Virus 4 NOT DETECTED NOT DETECTED Final   Respiratory Syncytial Virus NOT DETECTED NOT DETECTED Final   Bordetella pertussis NOT DETECTED NOT DETECTED Final   Bordetella Parapertussis NOT DETECTED NOT DETECTED Final   Chlamydophila pneumoniae NOT DETECTED NOT DETECTED Final   Mycoplasma pneumoniae NOT DETECTED NOT DETECTED Final    Comment: Performed at [facility], [place]. [address] [place], [place] [address]     Pertinent Lab seen by me:    Latest Ref Rng & Units 07/30/2022    5:00 AM 07/29/2022    5:07 PM 06/06/2022    8:33 PM  CBC  WBC 4.0 - 10.5 K/uL 8.8  7.7  5.5   Hemoglobin 12.0 - 15.0 g/dL 10.1  10.5  11.0   Hematocrit  36.0 - 46.0 % 32.0  33.1  35.5   Platelets 150 - 400 K/uL 103  106  120       Latest Ref  Rng & Units 07/30/2022    5:00 AM 07/29/2022    5:07 PM 06/06/2022    8:33 PM  CMP  Glucose 70 - 99 mg/dL 94  111  101   BUN 8 - 23 mg/dL '10  10  17   '$ Creatinine 0.44 - 1.00 mg/dL 0.75  0.72  0.68   Sodium 135 - 145 mmol/L 130  130  137   Potassium 3.5 - 5.1 mmol/L 3.9  4.2  3.8   Chloride 98 - 111 mmol/L 99  96  103   CO2 22 - 32 mmol/L '24  26  29   '$ Calcium 8.9 - 10.3 mg/dL 8.3  8.8  9.9   Total Protein 6.5 - 8.1 g/dL 9.0  9.8  9.5   Total Bilirubin 0.3 - 1.2 mg/dL 1.1  0.6  0.4   Alkaline Phos 38 - 126 U/L 92  102  98   AST 15 - 41 U/L 29  36  30   ALT 0 - 44 U/L 27  33  19      Pertinent Imagings/Other Imagings Plain films and CT images have been personally visualized and interpreted; radiology reports have been reviewed. Decision making incorporated into the Impression / Recommendations.  CT ABDOMEN PELVIS WO CONTRAST  Result Date: 07/29/2022 CLINICAL DATA:  Weakness and chills for 5 days, recent COVID, lower abdominal pain EXAM: CT ABDOMEN AND PELVIS WITHOUT CONTRAST TECHNIQUE: Multidetector CT imaging of the abdomen and pelvis was performed following the standard protocol without IV contrast. RADIATION DOSE REDUCTION: This exam was performed according to the departmental dose-optimization program which includes automated exposure control, adjustment of the mA and/or kV according to patient size and/or use of iterative reconstruction technique. COMPARISON:  04/06/2021 FINDINGS: Lower chest: Please see earlier chest CT describing post radiation changes within the right lung and right breast from known right breast cancer. Hepatobiliary: Unremarkable unenhanced appearance of the liver and gallbladder. Pancreas: Unremarkable unenhanced appearance. Spleen: Unremarkable unenhanced appearance. Adrenals/Urinary Tract: Excreted contrast from earlier CT chest. No filling defects within the kidneys,  ureters, or bladder. The adrenals are unremarkable. Stomach/Bowel: No bowel obstruction or ileus. Diverticulosis of the descending and sigmoid colon with no evidence of acute diverticulitis. Normal appendix right lower quadrant. No bowel wall thickening or inflammatory change. Vascular/Lymphatic: Aortic atherosclerosis. No enlarged abdominal or pelvic lymph nodes. Reproductive: Uterus and bilateral adnexa are unremarkable. Other: No free fluid or free intraperitoneal gas. No abdominal wall hernia. Musculoskeletal: No acute or destructive bony lesions. Reconstructed images demonstrate no additional findings. IMPRESSION: 1. Distal colonic diverticulosis without evidence of acute diverticulitis. 2. No acute intra-abdominal or intrapelvic process. 3.  Aortic Atherosclerosis (ICD10-I70.0). 4. Please see earlier chest CT report describing post therapeutic changes in the right breast and lung from known breast cancer. Electronically Signed   By: Randa Ngo M.D.   On: 07/29/2022 21:27   CT Angio Chest PE W and/or Wo Contrast  Result Date: 07/29/2022 CLINICAL DATA:  Chest pain, shortness of breath, COVID 2 weeks ago right breast cancer * Tracking Code: BO * EXAM: CT ANGIOGRAPHY CHEST WITH CONTRAST TECHNIQUE: Multidetector CT imaging of the chest was performed using the standard protocol during bolus administration of intravenous contrast. Multiplanar CT image reconstructions and MIPs were obtained to evaluate the vascular anatomy. RADIATION DOSE REDUCTION: This exam was performed according to the departmental dose-optimization program which includes automated exposure control, adjustment of the mA and/or kV according to patient size and/or use of iterative reconstruction technique. CONTRAST:  55m OMNIPAQUE IOHEXOL 350 MG/ML SOLN COMPARISON:  03/11/2022 FINDINGS: Cardiovascular: Examination is somewhat limited by breath motion artifact. Within this limitation, no evidence of pulmonary embolism through the segmental  pulmonary arterial level. Normal Church size. No pericardial effusion. Mediastinum/Nodes: Unchanged prominent left axillary lymph nodes. Thyroid gland, trachea, and esophagus demonstrate no significant findings. 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 No pleural effusion or pneumothorax. Upper Abdomen: No acute abnormality. Musculoskeletal: Skin thickening of the right breast with dystrophic calcifications and evidence of prior lumpectomy. No acute osseous findings. Review of the MIP images confirms the above findings. IMPRESSION: 1. Examination is somewhat limited by breath motion artifact. Within this limitation, no evidence of pulmonary embolism through the segmental pulmonary arterial level. 2. 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 3. Skin thickening of the right breast with dystrophic calcifications and evidence of prior lumpectomy. Unchanged prominent left axillary lymph nodes. Electronically Signed   By: ADelanna AhmadiM.D.   On: 07/29/2022 19:33   CT Head Wo Contrast  Result Date: 07/29/2022 CLINICAL DATA:  Hypertension and weakness EXAM: CT HEAD WITHOUT CONTRAST TECHNIQUE: Contiguous axial images were obtained from the base of the skull through the vertex without intravenous contrast. RADIATION DOSE REDUCTION: This exam was performed according to the departmental dose-optimization program which includes automated exposure control, adjustment of the mA and/or kV according to patient size and/or use of iterative reconstruction technique. COMPARISON:  MRI head 06/07/2022 FINDINGS: Brain: No evidence of acute infarction, hemorrhage, hydrocephalus, extra-axial collection or mass lesion/mass effect. There is stable mild periventricular and deep white matter hypodensity, likely chronic small vessel ischemic change. Vascular: No hyperdense vessel or unexpected calcification. Skull: Normal. Negative for fracture or focal lesion. Sinuses/Orbits: There is mucosal  thickening of the maxillary sinuses and ethmoid air cells. There is a small air-fluid level in the right maxillary sinus. Other: Subcutaneous lipoma is partially imaged in the upper right posterior neck measuring 4.9 x 6.4 cm similar to prior. IMPRESSION: 1. No acute intracranial process. 2. Mild chronic small vessel ischemic change. 3. Paranasal sinus disease with small air-fluid level in the right maxillary sinus. Correlate for acute sinusitis. Electronically Signed   By: ARonney AstersM.D.   On: 07/29/2022 19:21   DG Chest 2 View  Result Date: 07/29/2022 CLINICAL DATA:  Weakness EXAM: CHEST - 2 VIEW COMPARISON:  Chest x-ray 06/06/2022 FINDINGS: Left chest port catheter tip projects over the SVC, unchanged. The Church size and mediastinal contours are within normal limits. Both lungs are clear. The visualized skeletal structures are unremarkable. IMPRESSION: No active cardiopulmonary disease. Electronically Signed   By: ARonney AstersM.D.   On: 07/29/2022 17:06    I spent 100  minutes for this patient encounter including review of prior medical records/discussing diagnostics and treatment plan with the patient/family/coordinate care with primary/other specialits with greater than 50% of time in face to face encounter.   Electronically signed by:   SRosiland Oz MD Infectious Disease Physician [facility] Pager: [phone]

## 2022-07-30 NOTE — Progress Notes (Signed)
PHARMACY - PHYSICIAN COMMUNICATION CRITICAL VALUE ALERT - BLOOD CULTURE IDENTIFICATION (BCID)  Marie Church is an 67 y.o. female who presented to [facility] on 07/29/2022 with a chief complaint of fever and chills x 4 days  Assessment:  10 YOF with history of breast cancer with noted port-a-cath in place given history of prior chemotherapy. Per chart review - last seen by oncology in Jan '24 and doesn't appear to be on active chemotherapy however PAC left in place due to ongoing work-up and labs as needed.   Name of physician (or Provider) Contacted: Manandhar (ID consult)  Current antibiotics: Vancomycin  Changes to prescribed antibiotics recommended:  D/c Vancomycin and adjust to Cefazolin 2g IV every 8 hours for now until further clarifications can be made in her penicillin allergy  Results for orders placed or performed during the hospital encounter of 07/29/22  Blood Culture ID Panel (Reflexed) (Collected: 07/29/2022  5:07 PM)  Result Value Ref Range   Enterococcus faecalis NOT DETECTED NOT DETECTED   Enterococcus Faecium NOT DETECTED NOT DETECTED   Listeria monocytogenes NOT DETECTED NOT DETECTED   Staphylococcus species NOT DETECTED NOT DETECTED   Staphylococcus aureus (BCID) NOT DETECTED NOT DETECTED   Staphylococcus epidermidis NOT DETECTED NOT DETECTED   Staphylococcus lugdunensis NOT DETECTED NOT DETECTED   Streptococcus species DETECTED (A) NOT DETECTED   Streptococcus agalactiae NOT DETECTED NOT DETECTED   Streptococcus pneumoniae NOT DETECTED NOT DETECTED   Streptococcus pyogenes DETECTED (A) NOT DETECTED   A.calcoaceticus-baumannii NOT DETECTED NOT DETECTED   Bacteroides fragilis NOT DETECTED NOT DETECTED   Enterobacterales NOT DETECTED NOT DETECTED   Enterobacter cloacae complex NOT DETECTED NOT DETECTED   Escherichia coli NOT DETECTED NOT DETECTED   Klebsiella aerogenes NOT DETECTED NOT DETECTED   Klebsiella oxytoca NOT DETECTED NOT DETECTED   Klebsiella  pneumoniae NOT DETECTED NOT DETECTED   Proteus species NOT DETECTED NOT DETECTED   Salmonella species NOT DETECTED NOT DETECTED   Serratia marcescens NOT DETECTED NOT DETECTED   Haemophilus influenzae NOT DETECTED NOT DETECTED   Neisseria meningitidis NOT DETECTED NOT DETECTED   Pseudomonas aeruginosa NOT DETECTED NOT DETECTED   Stenotrophomonas maltophilia NOT DETECTED NOT DETECTED   Candida albicans NOT DETECTED NOT DETECTED   Candida auris NOT DETECTED NOT DETECTED   Candida glabrata NOT DETECTED NOT DETECTED   Candida krusei NOT DETECTED NOT DETECTED   Candida parapsilosis NOT DETECTED NOT DETECTED   Candida tropicalis NOT DETECTED NOT DETECTED   Cryptococcus neoformans/gattii NOT DETECTED NOT DETECTED    Thank you for allowing pharmacy to be a part of this patient's care.  Alycia Rossetti, PharmD, BCPS Infectious Diseases Clinical Pharmacist 07/30/2022 12:15 PM   **Pharmacist phone directory can now be found on [email or web address] ([email or web address]).  Listed under [place].

## 2022-07-30 NOTE — Progress Notes (Signed)
Patient ID: Marie Church, female   DOB: 1956-03-02, 67 y.o.   MRN: [id number] Request received for Port-A-Cath removal in patient. She is a 67 year old female with history of metastatic breast cancer and previous left chest wall Port-A-Cath placed in [place], [place]. presented to [facility], ED on 3/13 with weakness,fever and chills.  Patient had COVID approximately 2 weeks ago.  Recent blood cultures with gram-positive cocci in chains.  Pt seen by ID team and request now received for Port-A-Cath removal.  Details/risks of procedure, including but not limited to, internal bleeding, infection, injury to adjacent structures discussed with patient and daughter with their understanding and consent.  Procedure scheduled for today with local anesthesia.

## 2022-07-31 ENCOUNTER — Inpatient Hospital Stay (HOSPITAL_COMMUNITY): Payer: BLUE CROSS/BLUE SHIELD

## 2022-07-31 DIAGNOSIS — I10 Essential (primary) hypertension: Secondary | ICD-10-CM | POA: Diagnosis not present

## 2022-07-31 DIAGNOSIS — R7881 Bacteremia: Secondary | ICD-10-CM

## 2022-07-31 DIAGNOSIS — C50911 Malignant neoplasm of unspecified site of right female breast: Secondary | ICD-10-CM | POA: Diagnosis not present

## 2022-07-31 DIAGNOSIS — R509 Fever, unspecified: Secondary | ICD-10-CM | POA: Diagnosis not present

## 2022-07-31 DIAGNOSIS — T80212D Local infection due to central venous catheter, subsequent encounter: Secondary | ICD-10-CM | POA: Diagnosis not present

## 2022-07-31 HISTORY — DX: Bacteremia: R78.81

## 2022-07-31 LAB — ECHOCARDIOGRAM COMPLETE
Area-P 1/2: 4.39 cm2
Calc EF: 64.9 %
Height: 66 in
S' Lateral: 2.7 cm
Single Plane A2C EF: 67.7 %
Single Plane A4C EF: 61.1 %
Weight: 2786.61 oz

## 2022-07-31 LAB — HIV ANTIBODY (ROUTINE TESTING W REFLEX): HIV Screen 4th Generation wRfx: NONREACTIVE

## 2022-07-31 MED ORDER — MAGNESIUM SULFATE 2 GM/50ML IV SOLN
2.0000 g | Freq: Once | INTRAVENOUS | Status: AC
  Administered 2022-07-31: 2 g via INTRAVENOUS
  Filled 2022-07-31: qty 50

## 2022-07-31 NOTE — Progress Notes (Incomplete)
PROGRESS NOTE  Marie Church [id number] DOB: 1956/03/28 DOA: 07/29/2022 PCP: Pcp, No  Brief History   Patient is a 67 year old female with history of breast cancer, metastasized to the right axilla lymph node currently in remission, chronic systolic CHF with EF of [id number], hypertension who presented with 4 to 5 days history of fever, chills.  Recent history of covid in February.  Continues to have some upper respiratory symptoms.  She follows with oncology at [name] [facility] for the management of her breast cancer.  Has a Port-A-Cath.  On presentation she was febrile with temperature of 103.2, tachycardic, hypertensive.  UA was not suspicious for UTI.  WC count of 7.7.  CT chest/abdomen/pelvis negative for any source of fever.  Procalcitonin equivocal. Started  broad spectrum  antibiotics with vancomycin and cefepime.  ID consulted.  Now blood cultures showing gram-positive cocci.   Cultures have grown out strep pyogenes. TTE is negative for endocarditis or valvular vegetations. The patient's antibiotics have been changed to ancef. Surveillance blood cultures have had no growth as of yet. Per infectious disease there is low suspicion for endocarditis.   Consultants  Infectious disease Interventional Radiology   Procedures  Removal of port.  Antibiotics   Anti-infectives (From admission, onward)    Start     Dose/Rate Route Frequency Ordered Stop   07/30/22 1600  vancomycin (VANCOREADY) IVPB 1750 mg/350 mL  Status:  Discontinued        1,750 mg 175 mL/hr over 120 Minutes Intravenous Every 24 hours 07/29/22 2346 07/30/22 1208   07/30/22 1400  ceFAZolin (ANCEF) IVPB 2g/100 mL premix        2 g 200 mL/hr over 30 Minutes Intravenous Every 8 hours 07/30/22 1208     07/30/22 0200  ceFEPIme (MAXIPIME) 2 g in sodium chloride 0.9 % 100 mL IVPB  Status:  Discontinued        2 g 200 mL/hr over 30 Minutes Intravenous Every 8 hours 07/29/22 2346 07/30/22 1124   07/29/22 2145   ciprofloxacin (CIPRO) tablet 500 mg        500 mg Oral  Once 07/29/22 2142 07/29/22 2314   07/29/22 1745  vancomycin (VANCOREADY) IVPB 1500 mg/300 mL        1,500 mg 150 mL/hr over 120 Minutes Intravenous  Once 07/29/22 1736 07/29/22 2044   07/29/22 1730  ceFEPIme (MAXIPIME) 2 g in sodium chloride 0.9 % 100 mL IVPB        2 g 200 mL/hr over 30 Minutes Intravenous  Once 07/29/22 1727 07/29/22 1828        Subjective  The patient is resting comfortably in bed. No new complaints.   Objective   Vitals:  Vitals:   07/31/22 1237 07/31/22 1516  BP: 122/72 (!) 141/73  Pulse: 75 74  Resp: (!) 24 18  Temp: 98.6 F (37 C) 98.7 F (37.1 C)  SpO2: 100% 100%    Exam:  Constitutional:  The patient is awake, alert, and oriented x 3. No acute distress. Respiratory:  No increased work of breathing. No wheezes, rales, or rhonchi No tactile fremitus Cardiovascular:  Regular rate and rhythm No murmurs, ectopy, or gallups. No lateral PMI. No thrills. Abdomen:  Abdomen is soft, non-tender, non-distended No hernias, masses, or organomegaly Normoactive bowel sounds.  Musculoskeletal:  No cyanosis, clubbing, or edema Skin:  No rashes, lesions, ulcers palpation of skin: no induration or nodules Neurologic:  CN 2-12 intact Sensation all 4 extremities intact Psychiatric:  Mental  status Mood, affect appropriate Orientation to person, place, time  judgment and insight appear intact  I have personally reviewed the following:   Today's Data   Vitals:   08/01/22 0352 08/01/22 1414  BP: (!) 156/84 (!) 160/75  Pulse: 84 73  Resp: 20 18  Temp: 99.4 F (37.4 C)   SpO2: 100% 100%     Lab Data  CBC    Component Value Date/Time   WBC 4.6 08/01/2022 0356   RBC 3.06 (L) 08/01/2022 0356   HGB 8.6 (L) 08/01/2022 0356   HCT 27.3 (L) 08/01/2022 0356   PLT 100 (L) 08/01/2022 0356   MCV 89.2 08/01/2022 0356   MCH 28.1 08/01/2022 0356   MCHC 31.5 08/01/2022 0356   RDW 15.6 (H)  08/01/2022 0356   LYMPHSABS 0.8 08/01/2022 0356   MONOABS 0.2 08/01/2022 0356   EOSABS 0.1 08/01/2022 0356   BASOSABS 0.0 08/01/2022 0356      Latest Ref Rng & Units 08/01/2022    3:56 AM 07/30/2022    5:00 AM 07/29/2022    5:07 PM  BMP  Glucose 70 - 99 mg/dL 96  94  111   BUN 8 - 23 mg/dL 10  10  10    Creatinine 0.44 - 1.00 mg/dL 0.65  0.75  0.72   Sodium 135 - 145 mmol/L 133  130  130   Potassium 3.5 - 5.1 mmol/L 3.7  3.9  4.2   Chloride 98 - 111 mmol/L 101  99  96   CO2 22 - 32 mmol/L 24  24  26    Calcium 8.9 - 10.3 mg/dL 8.3  8.3  8.8      Micro Data   Results for orders placed or performed during the hospital encounter of 07/29/22  Resp panel by RT-PCR (RSV, Flu A&B, Covid) Anterior Nasal Swab     Status: None   Collection Time: 07/29/22  5:07 PM   Specimen: Anterior Nasal Swab  Result Value Ref Range Status   SARS Coronavirus 2 by RT PCR NEGATIVE NEGATIVE Final    Comment: (NOTE) SARS-CoV-2 target nucleic acids are NOT DETECTED.  The SARS-CoV-2 RNA is generally detectable in upper respiratory specimens during the acute phase of infection. The lowest concentration of SARS-CoV-2 viral copies this assay can detect is 138 copies/mL. A negative result does not preclude SARS-Cov-2 infection and should not be used as the sole basis for treatment or other patient management decisions. A negative result may occur with  improper specimen collection/handling, submission of specimen other than nasopharyngeal swab, presence of viral mutation(s) within the areas targeted by this assay, and inadequate number of viral copies(<138 copies/mL). A negative result must be combined with clinical observations, patient history, and epidemiological information. The expected result is Negative.  Fact Sheet for Patients:  EntrepreneurPulse.com.au  Fact Sheet for Healthcare Providers:  IncredibleEmployment.be  This test is no t yet approved or cleared by  the [place] FDA and  has been authorized for detection and/or diagnosis of SARS-CoV-2 by FDA under an Emergency Use Authorization (EUA). This EUA will remain  in effect (meaning this test can be used) for the duration of the COVID-19 declaration under Section 564(b)(1) of the Act, 21 U.S.C.section 360bbb-3(b)(1), unless the authorization is terminated  or revoked sooner.       Influenza A by PCR NEGATIVE NEGATIVE Final   Influenza B by PCR NEGATIVE NEGATIVE Final    Comment: (NOTE) The Xpert Xpress SARS-CoV-2/FLU/RSV plus assay is intended as an aid  in the diagnosis of influenza from Nasopharyngeal swab specimens and should not be used as a sole basis for treatment. Nasal washings and aspirates are unacceptable for Xpert Xpress SARS-CoV-2/FLU/RSV testing.  Fact Sheet for Patients: EntrepreneurPulse.com.au  Fact Sheet for Healthcare Providers: IncredibleEmployment.be  This test is not yet approved or cleared by the [place] FDA and has been authorized for detection and/or diagnosis of SARS-CoV-2 by FDA under an Emergency Use Authorization (EUA). This EUA will remain in effect (meaning this test can be used) for the duration of the COVID-19 declaration under Section 564(b)(1) of the Act, 21 U.S.C. section 360bbb-3(b)(1), unless the authorization is terminated or revoked.     Resp Syncytial Virus by PCR NEGATIVE NEGATIVE Final    Comment: (NOTE) Fact Sheet for Patients: EntrepreneurPulse.com.au  Fact Sheet for Healthcare Providers: IncredibleEmployment.be  This test is not yet approved or cleared by the [place] FDA and has been authorized for detection and/or diagnosis of SARS-CoV-2 by FDA under an Emergency Use Authorization (EUA). This EUA will remain in effect (meaning this test can be used) for the duration of the COVID-19 declaration under Section 564(b)(1) of the Act, 21  U.S.C. section 360bbb-3(b)(1), unless the authorization is terminated or revoked.  Performed at [facility], [place] [address] [place], [place] [address]   Blood culture (routine x 2)     Status: Abnormal   Collection Time: 07/29/22  5:07 PM   Specimen: BLOOD  Result Value Ref Range Status   Specimen Description   Final    BLOOD SITE NOT SPECIFIED Performed at [facility] [address] [place], [place] [address]    Special Requests   Final    BOTTLES DRAWN AEROBIC AND ANAEROBIC Blood Culture adequate volume   Culture  Setup Time   Final    GRAM POSITIVE COCCI IN CHAINS IN BOTH AEROBIC AND ANAEROBIC BOTTLES Organism ID to follow CRITICAL RESULT CALLED TO, READ BACK BY AND VERIFIED WITH: [place], AT 1137 07/30/22 [facility] [place] Performed at [facility], [place] [address], , [place] [address]    Culture STREPTOCOCCUS PYOGENES (A)  Final   Report Status 08/01/2022 FINAL  Final   Organism ID, Bacteria STREPTOCOCCUS PYOGENES  Final      Susceptibility   Streptococcus pyogenes - MIC*    PENICILLIN <=0.06 SENSITIVE Sensitive     CEFTRIAXONE <=0.12 SENSITIVE Sensitive     ERYTHROMYCIN <=0.12 SENSITIVE Sensitive     LEVOFLOXACIN 0.5 SENSITIVE Sensitive     VANCOMYCIN 0.25 SENSITIVE Sensitive     * STREPTOCOCCUS PYOGENES  Blood culture (routine x 2)     Status: Abnormal (Preliminary result)   Collection Time: 07/29/22  5:07 PM   Specimen: BLOOD  Result Value Ref Range Status   Specimen Description   Final    BLOOD RIGHT ANTECUBITAL Performed at [facility] [address] [place], [place] [address]    Special Requests   Final    BOTTLES DRAWN AEROBIC AND ANAEROBIC Blood Culture adequate volume Performed at [facility] [address], [place], [place] [address]    Culture  Setup Time   Final    GRAM POSITIVE COCCI IN CHAINS AEROBIC BOTTLE ONLY CRITICAL RESULT CALLED TO, READ BACK BY  AND VERIFIED WITH: GScherrie November PHARMD, AT 1137 07/30/22 D. VANHOOK    Culture (A)  Final    GROUP A STREP (S.PYOGENES) ISOLATED SUSCEPTIBILITIES PERFORMED ON PREVIOUS CULTURE WITHIN THE LAST 5 DAYS.  Performed at [facility], [place] [address], [place], [place] [address]    Report Status PENDING  Incomplete  Respiratory (~20 pathogens) panel by PCR     Status: None   Collection Time: 07/29/22  5:07 PM   Specimen: Nasopharyngeal Swab; Respiratory  Result Value Ref Range Status   Adenovirus NOT DETECTED NOT DETECTED Final   Coronavirus 229E NOT DETECTED NOT DETECTED Final    Comment: (NOTE) The Coronavirus on the Respiratory Panel, DOES NOT test for the novel  Coronavirus (2019 nCoV)    Coronavirus HKU1 NOT DETECTED NOT DETECTED Final   Coronavirus NL63 NOT DETECTED NOT DETECTED Final   Coronavirus OC43 NOT DETECTED NOT DETECTED Final   Metapneumovirus NOT DETECTED NOT DETECTED Final   Rhinovirus / Enterovirus NOT DETECTED NOT DETECTED Final   Influenza A NOT DETECTED NOT DETECTED Final   Influenza B NOT DETECTED NOT DETECTED Final   Parainfluenza Virus 1 NOT DETECTED NOT DETECTED Final   Parainfluenza Virus 2 NOT DETECTED NOT DETECTED Final   Parainfluenza Virus 3 NOT DETECTED NOT DETECTED Final   Parainfluenza Virus 4 NOT DETECTED NOT DETECTED Final   Respiratory Syncytial Virus NOT DETECTED NOT DETECTED Final   Bordetella pertussis NOT DETECTED NOT DETECTED Final   Bordetella Parapertussis NOT DETECTED NOT DETECTED Final   Chlamydophila pneumoniae NOT DETECTED NOT DETECTED Final   Mycoplasma pneumoniae NOT DETECTED NOT DETECTED Final    Comment: Performed at [facility], [place] [address], [place], [place] [address]  Blood Culture ID Panel (Reflexed)     Status: Abnormal   Collection Time: 07/29/22  5:07 PM  Result Value Ref Range Status   Enterococcus faecalis NOT DETECTED NOT DETECTED Final   Enterococcus Faecium NOT DETECTED NOT DETECTED Final   Listeria monocytogenes  NOT DETECTED NOT DETECTED Final   Staphylococcus species NOT DETECTED NOT DETECTED Final   Staphylococcus aureus (BCID) NOT DETECTED NOT DETECTED Final   Staphylococcus epidermidis NOT DETECTED NOT DETECTED Final   Staphylococcus lugdunensis NOT DETECTED NOT DETECTED Final   Streptococcus species DETECTED (A) NOT DETECTED Final    Comment: CRITICAL RESULT CALLED TO, READ BACK BY AND VERIFIED WITH: GScherrie November PHARMD, AT 1137 07/30/22 D. VANHOOK    Streptococcus agalactiae NOT DETECTED NOT DETECTED Final   Streptococcus pneumoniae NOT DETECTED NOT DETECTED Final   Streptococcus pyogenes DETECTED (A) NOT DETECTED Final    Comment: CRITICAL RESULT CALLED TO, READ BACK BY AND VERIFIED WITH: GScherrie November PHARMD, AT 1137 07/30/22 D. VANHOOK    A.calcoaceticus-baumannii NOT DETECTED NOT DETECTED Final   Bacteroides fragilis NOT DETECTED NOT DETECTED Final   Enterobacterales NOT DETECTED NOT DETECTED Final   Enterobacter cloacae complex NOT DETECTED NOT DETECTED Final   Escherichia coli NOT DETECTED NOT DETECTED Final   Klebsiella aerogenes NOT DETECTED NOT DETECTED Final   Klebsiella oxytoca NOT DETECTED NOT DETECTED Final   Klebsiella pneumoniae NOT DETECTED NOT DETECTED Final   Proteus species NOT DETECTED NOT DETECTED Final   Salmonella species NOT DETECTED NOT DETECTED Final   Serratia marcescens NOT DETECTED NOT DETECTED Final   Haemophilus influenzae NOT DETECTED NOT DETECTED Final   Neisseria meningitidis NOT DETECTED NOT DETECTED Final   Pseudomonas aeruginosa NOT DETECTED NOT DETECTED Final   Stenotrophomonas maltophilia NOT DETECTED NOT DETECTED Final   Candida albicans NOT DETECTED NOT DETECTED Final   Candida auris NOT DETECTED NOT DETECTED Final   Candida glabrata NOT DETECTED NOT DETECTED Final   Candida krusei NOT DETECTED NOT  DETECTED Final   Candida parapsilosis NOT DETECTED NOT DETECTED Final   Candida tropicalis NOT DETECTED NOT DETECTED Final   Cryptococcus neoformans/gattii  NOT DETECTED NOT DETECTED Final    Comment: Performed at [facility], [place] [address] [place], [place] [address]  Culture, blood (single)     Status: None (Preliminary result)   Collection Time: 07/30/22  1:32 AM   Specimen: BLOOD  Result Value Ref Range Status   Specimen Description   Final    BLOOD PORTA CATH Performed at [facility], [place] [address], [place], [place] [address]    Special Requests   Final    BOTTLES DRAWN AEROBIC AND ANAEROBIC Blood Culture adequate volume Performed at [facility] [address] [place], [place] [address]    Culture   Final    NO GROWTH 2 DAYS Performed at [facility] [address] [place], [place] [address]    Report Status PENDING  Incomplete    Cardiology Data  TTE: No evidence of endocarditis or valvular vegetations.  Scheduled Meds:  carvedilol  12.5 mg Oral BID WC   pantoprazole  40 mg Oral Daily   Continuous Infusions:   ceFAZolin (ANCEF) IV 2 g (07/31/22 1626)    Principal Problem:   Fever Active Problems:   Essential hypertension   HFrEF (heart failure with reduced ejection fraction) (HCC)   Hyponatremia   Breast cancer metastasized to axillary lymph node, right (HCC)   Thrombocytopenia (HCC)   Streptococcal bacteremia   Port or reservoir infection   Bacteremia   LOS: 1 day    A & P  Fever Observation med/surg bed. CT head/chest/abd/pelvis negative for source of fever. UA negative. Pt does have port-a-cath that she uses for q3 month chemo that she received via [facility] in [place], [place]. Will obtain blood cx via port-a-cath. Continue with cefepime and vanco for now. Pt aware that if port-a-cath is infected, this will require removal and she will need to contact her oncologist at [facility] to have a new port-a-cath placed for her next chemo session. Will also send RVP to rule out other viral illness.  Essential hypertension Stable. Pt has been living in [place] for about 2  years. Has been getting refills on some meds from old PCP in [place]. Is only taking coreg and farxiga at present time. Continue with coreg.  HFrEF (heart failure with reduced ejection fraction) ([facility]) Last echo 02-2021 with EF of 50%. Has run out of entresto. Pt evasive on how long it has been since she took entresto. Only has coreg and farxiga in her bag of meds. 90 day supply of coreg was from October 2023.  Hyponatremia Chronic. Has had sodium of 130 in October 2023. Check TSH. Check serum osm, urine osm, urine electrolytes. Start gentle NS @ 75 ml/hr.  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 Follows with outpatient [facility] oncology in [place], [place].  Thrombocytopenia (HCC) Chronic. Hold lovenox/heparin. Can be addressed with her oncologist in [place], [place] at [facility].   I have seen and examined this patient myself.   DVT prophylaxis: SCD's Code Status: Full Code Family Communication: None available Disposition Plan: Home    Altair Stanko, DO [facility] Direct contact: see [email or web address]  7PM-7AM contact night coverage as above 07/31/2022, 6:36 PM  LOS: 1 day

## 2022-07-31 NOTE — Progress Notes (Signed)
[facility] Infectious Diseases Follow Up Note  Patient Identification: Patient Name: Marie Church MRN: [id number] [place] Date: 07/29/2022  4:19 PM Age: 67 y.o.Today's Date: 07/31/2022  Reason for Visit: bacteremia   Principal Problem:   Fever Active Problems:   Essential hypertension   HFrEF (heart failure with reduced ejection fraction) (HCC)   Hyponatremia   Thrombocytopenia (HCC)   Breast cancer metastasized to axillary lymph node, right (HCC)   Streptococcal bacteremia   Port or reservoir infection   Antibiotics:  Vancomycin 3/13- Cefepime 3/13, cefazolin 3/14-c Ciprofloxacin 3/13-    Lines/Hardware: Left chest port removed 3/14  Interval Events: fever curve is downtrending  Assessment 67 year old female with PMH as below including rt breast cancer metastatic to the rt axillary nodes status post lumpectomy 11/2020 followed by re-excision of positive margin 12/2020 s/p chemotherapy  stopped due to thrombocytopenia on close surviellance by Oncology at [facility],  [place], [place] 07/10/22 who presented fevers and chills, weakness etc for last 4-5 days.    Strep pyogenes bacteremia: likely source is left chest port  Left chest port removed 3/14 Repeat blood cx 3/14 NG in 1 day TTE 3/15 no vegetations or endocarditis Low suspicion for endocarditis   Penicillin allergy: described as swelling that she needed to go to hospital 2 years ago and no plans for PO amoxicillin challenge currently   Recommendations Continue cefazolin as is Fu repeat blood cx to make sure they remain no growth in 48-72 hrs Final recs pending clearance of blood cultures   Dr Juleen China on call this weekend and will fu cultures   Rest of the management as per the primary team. Thank you for the consult. Please page with pertinent questions or concerns.  ______________________________________________________________________ Subjective patient  seen and examined at the bedside.   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   Hypertension    Past Surgical History:  Procedure Laterality Date   BREAST LUMPECTOMY Right 11/30/2020   BRONCHIAL BIOPSY  04/09/2021   Procedure: BRONCHIAL BIOPSIES;  Surgeon: [name], MD;  Location: [facility];  Service: Pulmonary;;   BRONCHIAL NEEDLE ASPIRATION BIOPSY  04/09/2021   Procedure: BRONCHIAL NEEDLE ASPIRATION BIOPSIES;  Surgeon: [name], MD;  Location: [facility];  Service: Pulmonary;;   BRONCHIAL WASHINGS  04/09/2021   Procedure: BRONCHIAL WASHINGS;  Surgeon: [name], MD;  Location: [facility];  Service: Pulmonary;;   ENDOBRONCHIAL ULTRASOUND N/A 04/09/2021   Procedure: ENDOBRONCHIAL ULTRASOUND;  Surgeon: [name], MD;  Location: [facility];  Service: Pulmonary;  Laterality: N/A;   HEMOSTASIS CONTROL  04/09/2021   Procedure: HEMOSTASIS CONTROL;  Surgeon: [name], MD;  Location: [facility];  Service: Pulmonary;;   IR REMOVAL TUN ACCESS W/ PORT W/O FL MOD SED  07/30/2022   RIGHT/LEFT HEART CATH AND CORONARY ANGIOGRAPHY N/A 10/10/2020   Procedure: RIGHT/LEFT HEART CATH AND CORONARY ANGIOGRAPHY;  Surgeon: Jolaine Artist, MD;  Location: [facility];  Service: Cardiovascular;  Laterality: N/A;   VIDEO BRONCHOSCOPY N/A 04/09/2021   Procedure: VIDEO BRONCHOSCOPY WITH FLUORO;  Surgeon: [name], MD;  Location: [facility];  Service: Pulmonary;  Laterality: N/A;    Vitals BP (!) 141/73 (BP Location: Left Arm)   Pulse 74   Temp 98.7 F (37.1 C)   Resp 18   Ht 5\' 6"  (1.676 m)   Wt 79 kg  SpO2 100%   BMI 28.11 kg/m     Physical Exam Constitutional:  elderly black female lying in the bed, not in acute distress    Comments:   Cardiovascular:     Rate and Rhythm: Normal rate and regular rhythm.     Heart sounds:  s1s2  Pulmonary:     Effort: Pulmonary effort is normal on room air     Comments:   Abdominal:     Palpations: Abdomen is soft.     Tenderness: non distended   Musculoskeletal:        General: No swelling or tenderness in peripheral joints   Skin:    Comments: left chest port bandaged C/D/I  Neurological:     General: awake, alert and oriented, following commands  Psychiatric:        Mood and Affect: Mood normal  Pertinent Microbiology Results for orders placed or performed during the hospital encounter of 07/29/22  Resp panel by RT-PCR (RSV, Flu A&B, Covid) Anterior Nasal Swab     Status: None   Collection Time: 07/29/22  5:07 PM   Specimen: Anterior Nasal Swab  Result Value Ref Range Status   SARS Coronavirus 2 by RT PCR NEGATIVE NEGATIVE Final    Comment: (NOTE) SARS-CoV-2 target nucleic acids are NOT DETECTED.  The SARS-CoV-2 RNA is generally detectable in upper respiratory specimens during the acute phase of infection. The lowest concentration of SARS-CoV-2 viral copies this assay can detect is 138 copies/mL. A negative result does not preclude SARS-Cov-2 infection and should not be used as the sole basis for treatment or other patient management decisions. A negative result may occur with  improper specimen collection/handling, submission of specimen other than nasopharyngeal swab, presence of viral mutation(s) within the areas targeted by this assay, and inadequate number of viral copies(<138 copies/mL). A negative result must be combined with clinical observations, patient history, and epidemiological information. The expected result is Negative.  Fact Sheet for Patients:  EntrepreneurPulse.com.au  Fact Sheet for Healthcare Providers:  IncredibleEmployment.be  This test is no t yet approved or cleared by the [place] FDA and  has been authorized for detection and/or diagnosis of SARS-CoV-2 by FDA under an Emergency  Use Authorization (EUA). This EUA will remain  in effect (meaning this test can be used) for the duration of the COVID-19 declaration under Section 564(b)(1) of the Act, 21 U.S.C.section 360bbb-3(b)(1), unless the authorization is terminated  or revoked sooner.       Influenza A by PCR NEGATIVE NEGATIVE Final   Influenza B by PCR NEGATIVE NEGATIVE Final    Comment: (NOTE) The Xpert Xpress SARS-CoV-2/FLU/RSV plus assay is intended as an aid in the diagnosis of influenza from Nasopharyngeal swab specimens and should not be used as a sole basis for treatment. Nasal washings and aspirates are unacceptable for Xpert Xpress SARS-CoV-2/FLU/RSV testing.  Fact Sheet for Patients: EntrepreneurPulse.com.au  Fact Sheet for Healthcare Providers: IncredibleEmployment.be  This test is not yet approved or cleared by the [place] FDA and has been authorized for detection and/or diagnosis of SARS-CoV-2 by FDA under an Emergency Use Authorization (EUA). This EUA will remain in effect (meaning this test can be used) for the duration of the COVID-19 declaration under Section 564(b)(1) of the Act, 21 U.S.C. section 360bbb-3(b)(1), unless the authorization is terminated or revoked.     Resp Syncytial Virus by PCR NEGATIVE NEGATIVE Final    Comment: (NOTE) Fact Sheet for Patients: EntrepreneurPulse.com.au  Fact Sheet for Healthcare Providers: IncredibleEmployment.be  This test is not yet approved or cleared by the [place] and has been authorized for detection and/or diagnosis of SARS-CoV-2 by FDA under an Emergency Use Authorization (EUA). This EUA will remain in effect (meaning this test can be used) for the duration of the COVID-19 declaration under Section 564(b)(1) of the Act, 21 U.S.C. section 360bbb-3(b)(1), unless the authorization is terminated or revoked.  Performed at [facility],  [place] [address], [place], [place] [address]   Blood culture (routine x 2)     Status: None (Preliminary result)   Collection Time: 07/29/22  5:07 PM   Specimen: BLOOD  Result Value Ref Range Status   Specimen Description   Final    BLOOD SITE NOT SPECIFIED Performed at [facility] [address] [place], [place] [address]    Special Requests   Final    BOTTLES DRAWN AEROBIC AND ANAEROBIC Blood Culture adequate volume   Culture  Setup Time   Final    GRAM POSITIVE COCCI IN CHAINS IN BOTH AEROBIC AND ANAEROBIC BOTTLES Organism ID to follow CRITICAL RESULT CALLED TO, READ BACK BY AND VERIFIED WITH: [place], AT 1137 07/30/22 [place] Performed at [facility], [place] [address], [place], [place] [address]    Culture GRAM POSITIVE COCCI  Final   Report Status PENDING  Incomplete  Blood culture (routine x 2)     Status: None (Preliminary result)   Collection Time: 07/29/22  5:07 PM   Specimen: BLOOD  Result Value Ref Range Status   Specimen Description   Final    BLOOD RIGHT ANTECUBITAL Performed at [facility] [address] [place], [place] [address]    Special Requests   Final    BOTTLES DRAWN AEROBIC AND ANAEROBIC Blood Culture adequate volume Performed at [facility] [address], [place], [place] [address]    Culture  Setup Time   Final    GRAM POSITIVE COCCI IN CHAINS AEROBIC BOTTLE ONLY CRITICAL RESULT CALLED TO, READ BACK BY AND VERIFIED WITH: Bosie Helper PHARMD, AT 1137 07/30/22 D. [place]AN[place]    Culture   Final    GRAM POSITIVE COCCI IDENTIFICATION TO FOLLOW Performed at [facility], [place] [address] [place], [place] [address]    Report Status PENDING  Incomplete  Respiratory (~20 pathogens) panel by PCR     Status: None   Collection Time: 07/29/22  5:07 PM   Specimen: Nasopharyngeal Swab; Respiratory  Result Value Ref Range Status   Adenovirus NOT DETECTED NOT DETECTED Final    Coronavirus 229E NOT DETECTED NOT DETECTED Final    Comment: (NOTE) The Coronavirus on the Respiratory Panel, DOES NOT test for the novel  Coronavirus (2019 nCoV)    Coronavirus HKU1 NOT DETECTED NOT DETECTED Final   Coronavirus NL63 NOT DETECTED NOT DETECTED Final   Coronavirus OC43 NOT DETECTED NOT DETECTED Final   Metapneumovirus NOT DETECTED NOT DETECTED Final   Rhinovirus / Enterovirus NOT DETECTED NOT DETECTED Final   Influenza A NOT DETECTED NOT DETECTED Final   Influenza B NOT DETECTED NOT DETECTED Final   Parainfluenza Virus 1 NOT DETECTED NOT DETECTED Final   Parainfluenza Virus 2 NOT DETECTED NOT DETECTED Final   Parainfluenza Virus 3 NOT DETECTED NOT DETECTED Final   Parainfluenza Virus 4 NOT DETECTED NOT DETECTED Final   Respiratory Syncytial Virus NOT DETECTED NOT DETECTED Final   Bordetella pertussis NOT DETECTED NOT DETECTED Final  Bordetella Parapertussis NOT DETECTED NOT DETECTED Final   Chlamydophila pneumoniae NOT DETECTED NOT DETECTED Final   Mycoplasma pneumoniae NOT DETECTED NOT DETECTED Final    Comment: Performed at [facility], [place] [address], [place], [place] [address]  Blood Culture ID Panel (Reflexed)     Status: Abnormal   Collection Time: 07/29/22  5:07 PM  Result Value Ref Range Status   Enterococcus faecalis NOT DETECTED NOT DETECTED Final   Enterococcus Faecium NOT DETECTED NOT DETECTED Final   Listeria monocytogenes NOT DETECTED NOT DETECTED Final   Staphylococcus species NOT DETECTED NOT DETECTED Final   Staphylococcus aureus (BCID) NOT DETECTED NOT DETECTED Final   Staphylococcus epidermidis NOT DETECTED NOT DETECTED Final   Staphylococcus lugdunensis NOT DETECTED NOT DETECTED Final   Streptococcus species DETECTED (A) NOT DETECTED Final    Comment: CRITICAL RESULT CALLED TO, READ BACK BY AND VERIFIED WITH: GScherrie November PHARMD, AT 1137 07/30/22 D. VANHOOK    Streptococcus agalactiae NOT DETECTED NOT DETECTED Final   Streptococcus  pneumoniae NOT DETECTED NOT DETECTED Final   Streptococcus pyogenes DETECTED (A) NOT DETECTED Final    Comment: CRITICAL RESULT CALLED TO, READ BACK BY AND VERIFIED WITH: Bosie Helper PHARMD, AT 1137 07/30/22 D. VANHOOK    A.calcoaceticus-baumannii NOT DETECTED NOT DETECTED Final   Bacteroides fragilis NOT DETECTED NOT DETECTED Final   Enterobacterales NOT DETECTED NOT DETECTED Final   Enterobacter cloacae complex NOT DETECTED NOT DETECTED Final   Escherichia coli NOT DETECTED NOT DETECTED Final   Klebsiella aerogenes NOT DETECTED NOT DETECTED Final   Klebsiella oxytoca NOT DETECTED NOT DETECTED Final   Klebsiella pneumoniae NOT DETECTED NOT DETECTED Final   Proteus species NOT DETECTED NOT DETECTED Final   Salmonella species NOT DETECTED NOT DETECTED Final   Serratia marcescens NOT DETECTED NOT DETECTED Final   Haemophilus influenzae NOT DETECTED NOT DETECTED Final   Neisseria meningitidis NOT DETECTED NOT DETECTED Final   Pseudomonas aeruginosa NOT DETECTED NOT DETECTED Final   Stenotrophomonas maltophilia NOT DETECTED NOT DETECTED Final   Candida albicans NOT DETECTED NOT DETECTED Final   Candida auris NOT DETECTED NOT DETECTED Final   Candida glabrata NOT DETECTED NOT DETECTED Final   Candida krusei NOT DETECTED NOT DETECTED Final   Candida parapsilosis NOT DETECTED NOT DETECTED Final   Candida tropicalis NOT DETECTED NOT DETECTED Final   Cryptococcus neoformans/gattii NOT DETECTED NOT DETECTED Final    Comment: Performed at [facility], [address], [place], [place] [address]  Culture, blood (single)     Status: None (Preliminary result)   Collection Time: 07/30/22  1:32 AM   Specimen: BLOOD  Result Value Ref Range Status   Specimen Description   Final    BLOOD PORTA CATH Performed at [facility], [place] [address], [place], [place] [address]    Special Requests   Final    BOTTLES DRAWN AEROBIC AND ANAEROBIC Blood Culture adequate volume Performed at [facility] [address], [place], [place] [address]    Culture   Final    NO GROWTH 1 DAY Performed at [facility], [place] [address], [place], [place] [address]    Report Status PENDING  Incomplete   Pertinent Lab.    Latest Ref Rng & Units 07/30/2022    5:00 AM 07/29/2022    5:07 PM 06/06/2022    8:33 PM  CBC  WBC 4.0 - 10.5 K/uL 8.8  7.7  5.5   Hemoglobin  12.0 - 15.0 g/dL 10.1  10.5  11.0   Hematocrit 36.0 - 46.0 % 32.0  33.1  35.5   Platelets 150 - 400 K/uL 103  106  120       Latest Ref Rng & Units 07/30/2022    5:00 AM 07/29/2022    5:07 PM 06/06/2022    8:33 PM  CMP  Glucose 70 - 99 mg/dL 94  111  101   BUN 8 - 23 mg/dL 10  10  17    Creatinine 0.44 - 1.00 mg/dL 0.75  0.72  0.68   Sodium 135 - 145 mmol/L 130  130  137   Potassium 3.5 - 5.1 mmol/L 3.9  4.2  3.8   Chloride 98 - 111 mmol/L 99  96  103   CO2 22 - 32 mmol/L 24  26  29    Calcium 8.9 - 10.3 mg/dL 8.3  8.8  9.9   Total Protein 6.5 - 8.1 g/dL 9.0  9.8  9.5   Total Bilirubin 0.3 - 1.2 mg/dL 1.1  0.6  0.4   Alkaline Phos 38 - 126 U/L 92  102  98   AST 15 - 41 U/L 29  36  30   ALT 0 - 44 U/L 27  33  19      Pertinent Imaging today Plain films and CT images have been personally visualized and interpreted; radiology reports have been reviewed. Decision making incorporated into the Impression / Recommendations.  No results found.  I spent 55 minutes for this patient encounter including review of prior medical records, coordination of care with primary/other specialist with greater than 50% of time being face to face/counseling and discussing diagnostics/treatment plan with the patient/family.  Electronically signed by:   Rosiland Oz, MD Infectious Disease Physician [facility] Pager: [phone]

## 2022-07-31 NOTE — Progress Notes (Signed)
Echocardiogram 2D Echocardiogram has been performed.  Frances Furbish 07/31/2022, 1:38 PM

## 2022-08-01 DIAGNOSIS — I10 Essential (primary) hypertension: Secondary | ICD-10-CM | POA: Diagnosis not present

## 2022-08-01 DIAGNOSIS — R509 Fever, unspecified: Secondary | ICD-10-CM | POA: Diagnosis not present

## 2022-08-01 DIAGNOSIS — R7881 Bacteremia: Secondary | ICD-10-CM | POA: Diagnosis not present

## 2022-08-01 DIAGNOSIS — C50911 Malignant neoplasm of unspecified site of right female breast: Secondary | ICD-10-CM | POA: Diagnosis not present

## 2022-08-01 DIAGNOSIS — T80212D Local infection due to central venous catheter, subsequent encounter: Secondary | ICD-10-CM

## 2022-08-01 DIAGNOSIS — B955 Unspecified streptococcus as the cause of diseases classified elsewhere: Secondary | ICD-10-CM

## 2022-08-01 LAB — CBC WITH DIFFERENTIAL/PLATELET
Abs Immature Granulocytes: 0 10*3/uL (ref 0.00–0.07)
Basophils Absolute: 0 10*3/uL (ref 0.0–0.1)
Basophils Relative: 0 %
Eosinophils Absolute: 0.1 10*3/uL (ref 0.0–0.5)
Eosinophils Relative: 2 %
HCT: 27.3 % — ABNORMAL LOW (ref 36.0–46.0)
Hemoglobin: 8.6 g/dL — ABNORMAL LOW (ref 12.0–15.0)
Lymphocytes Relative: 17 %
Lymphs Abs: 0.8 10*3/uL (ref 0.7–4.0)
MCH: 28.1 pg (ref 26.0–34.0)
MCHC: 31.5 g/dL (ref 30.0–36.0)
MCV: 89.2 fL (ref 80.0–100.0)
Monocytes Absolute: 0.2 10*3/uL (ref 0.1–1.0)
Monocytes Relative: 5 %
Neutro Abs: 3.5 10*3/uL (ref 1.7–7.7)
Neutrophils Relative %: 76 %
Platelets: 100 10*3/uL — ABNORMAL LOW (ref 150–400)
RBC: 3.06 MIL/uL — ABNORMAL LOW (ref 3.87–5.11)
RDW: 15.6 % — ABNORMAL HIGH (ref 11.5–15.5)
WBC: 4.6 10*3/uL (ref 4.0–10.5)
nRBC: 0 % (ref 0.0–0.2)

## 2022-08-01 LAB — CULTURE, BLOOD (ROUTINE X 2): Special Requests: ADEQUATE

## 2022-08-01 LAB — BASIC METABOLIC PANEL
Anion gap: 8 (ref 5–15)
BUN: 10 mg/dL (ref 8–23)
CO2: 24 mmol/L (ref 22–32)
Calcium: 8.3 mg/dL — ABNORMAL LOW (ref 8.9–10.3)
Chloride: 101 mmol/L (ref 98–111)
Creatinine, Ser: 0.65 mg/dL (ref 0.44–1.00)
GFR, Estimated: >60 mL/min (ref >60)
Glucose, Bld: 96 mg/dL (ref 70–99)
Potassium: 3.7 mmol/L (ref 3.5–5.1)
Sodium: 133 mmol/L — ABNORMAL LOW (ref 135–145)

## 2022-08-01 NOTE — Progress Notes (Signed)
PROGRESS NOTE  Marie Church [id number] DOB: 1955/11/27 DOA: 07/29/2022 PCP: Pcp, No  Brief History   Patient is a 67 year old female with history of breast cancer, metastasized to the right axilla lymph node currently in remission, chronic systolic CHF with EF of [id number], hypertension who presented with 4 to 5 days history of fever, chills.  Recent history of covid in February.  Continues to have some upper respiratory symptoms.  She follows with oncology at [facility] for the management of her breast cancer.  Has a Port-A-Cath.  On presentation she was febrile with temperature of 103.2, tachycardic, hypertensive.  UA was not suspicious for UTI.  WC count of 7.7.  CT chest/abdomen/pelvis negative for any source of fever.  Procalcitonin equivocal. Started  broad spectrum  antibiotics with vancomycin and cefepime.  ID consulted.  Now blood cultures showing gram-positive cocci.   Cultures have grown out strep pyogenes. TTE is negative for endocarditis or valvular vegetations. The patient's antibiotics have been changed to ancef. Surveillance blood cultures have had no growth as of yet. Per infectious disease there is low suspicion for endocarditis.   Consultants  Infectious disease Interventional Radiology   Procedures  Removal of port.  Antibiotics   Anti-infectives (From admission, onward)    Start     Dose/Rate Route Frequency Ordered Stop   07/30/22 1600  vancomycin (VANCOREADY) IVPB 1750 mg/350 mL  Status:  Discontinued        1,750 mg 175 mL/hr over 120 Minutes Intravenous Every 24 hours 07/29/22 2346 07/30/22 1208   07/30/22 1400  ceFAZolin (ANCEF) IVPB 2g/100 mL premix        2 g 200 mL/hr over 30 Minutes Intravenous Every 8 hours 07/30/22 1208     07/30/22 0200  ceFEPIme (MAXIPIME) 2 g in sodium chloride 0.9 % 100 mL IVPB  Status:  Discontinued        2 g 200 mL/hr over 30 Minutes Intravenous Every 8 hours 07/29/22 2346 07/30/22 1124   07/29/22 2145   ciprofloxacin (CIPRO) tablet 500 mg        500 mg Oral  Once 07/29/22 2142 07/29/22 2314   07/29/22 1745  vancomycin (VANCOREADY) IVPB 1500 mg/300 mL        1,500 mg 150 mL/hr over 120 Minutes Intravenous  Once 07/29/22 1736 07/29/22 2044   07/29/22 1730  ceFEPIme (MAXIPIME) 2 g in sodium chloride 0.9 % 100 mL IVPB        2 g 200 mL/hr over 30 Minutes Intravenous  Once 07/29/22 1727 07/29/22 1828        Subjective  The patient is resting comfortably in bed. No new complaints.   Objective   Vitals:  Vitals:   07/31/22 1237 07/31/22 1516  BP: 122/72 (!) 141/73  Pulse: 75 74  Resp: (!) 24 18  Temp: 98.6 F (37 C) 98.7 F (37.1 C)  SpO2: 100% 100%    Exam:  Constitutional:  The patient is awake, alert, and oriented x 3. No acute distress. Respiratory:  No increased work of breathing. No wheezes, rales, or rhonchi No tactile fremitus Cardiovascular:  Regular rate and rhythm No murmurs, ectopy, or gallups. No lateral PMI. No thrills. Abdomen:  Abdomen is soft, non-tender, non-distended No hernias, masses, or organomegaly Normoactive bowel sounds.  Musculoskeletal:  No cyanosis, clubbing, or edema Skin:  No rashes, lesions, ulcers palpation of skin: no induration or nodules Neurologic:  CN 2-12 intact Sensation all 4 extremities intact Psychiatric:  Mental  status Mood, affect appropriate Orientation to person, place, time  judgment and insight appear intact  I have personally reviewed the following:   Today's Data   Vitals:   08/01/22 0352 08/01/22 1414  BP: (!) 156/84 (!) 160/75  Pulse: 84 73  Resp: 20 18  Temp: 99.4 F (37.4 C)   SpO2: 100% 100%     Lab Data  CBC    Component Value Date/Time   WBC 4.6 08/01/2022 0356   RBC 3.06 (L) 08/01/2022 0356   HGB 8.6 (L) 08/01/2022 0356   HCT 27.3 (L) 08/01/2022 0356   PLT 100 (L) 08/01/2022 0356   MCV 89.2 08/01/2022 0356   MCH 28.1 08/01/2022 0356   MCHC 31.5 08/01/2022 0356   RDW 15.6 (H)  08/01/2022 0356   LYMPHSABS 0.8 08/01/2022 0356   MONOABS 0.2 08/01/2022 0356   EOSABS 0.1 08/01/2022 0356   BASOSABS 0.0 08/01/2022 0356      Latest Ref Rng & Units 08/01/2022    3:56 AM 07/30/2022    5:00 AM 07/29/2022    5:07 PM  BMP  Glucose 70 - 99 mg/dL 96  94  111   BUN 8 - 23 mg/dL 10  10  10    Creatinine 0.44 - 1.00 mg/dL 0.65  0.75  0.72   Sodium 135 - 145 mmol/L 133  130  130   Potassium 3.5 - 5.1 mmol/L 3.7  3.9  4.2   Chloride 98 - 111 mmol/L 101  99  96   CO2 22 - 32 mmol/L 24  24  26    Calcium 8.9 - 10.3 mg/dL 8.3  8.3  8.8      Micro Data   Results for orders placed or performed during the hospital encounter of 07/29/22  Resp panel by RT-PCR (RSV, Flu A&B, Covid) Anterior Nasal Swab     Status: None   Collection Time: 07/29/22  5:07 PM   Specimen: Anterior Nasal Swab  Result Value Ref Range Status   SARS Coronavirus 2 by RT PCR NEGATIVE NEGATIVE Final    Comment: (NOTE) SARS-CoV-2 target nucleic acids are NOT DETECTED.  The SARS-CoV-2 RNA is generally detectable in upper respiratory specimens during the acute phase of infection. The lowest concentration of SARS-CoV-2 viral copies this assay can detect is 138 copies/mL. A negative result does not preclude SARS-Cov-2 infection and should not be used as the sole basis for treatment or other patient management decisions. A negative result may occur with  improper specimen collection/handling, submission of specimen other than nasopharyngeal swab, presence of viral mutation(s) within the areas targeted by this assay, and inadequate number of viral copies(<138 copies/mL). A negative result must be combined with clinical observations, patient history, and epidemiological information. The expected result is Negative.  Fact Sheet for Patients:  EntrepreneurPulse.com.au  Fact Sheet for Healthcare Providers:  IncredibleEmployment.be  This test is no t yet approved or cleared by  the [place] FDA and  has been authorized for detection and/or diagnosis of SARS-CoV-2 by FDA under an Emergency Use Authorization (EUA). This EUA will remain  in effect (meaning this test can be used) for the duration of the COVID-19 declaration under Section 564(b)(1) of the Act, 21 U.S.C.section 360bbb-3(b)(1), unless the authorization is terminated  or revoked sooner.       Influenza A by PCR NEGATIVE NEGATIVE Final   Influenza B by PCR NEGATIVE NEGATIVE Final    Comment: (NOTE) The Xpert Xpress SARS-CoV-2/FLU/RSV plus assay is intended as an aid  in the diagnosis of influenza from Nasopharyngeal swab specimens and should not be used as a sole basis for treatment. Nasal washings and aspirates are unacceptable for Xpert Xpress SARS-CoV-2/FLU/RSV testing.  Fact Sheet for Patients: EntrepreneurPulse.com.au  Fact Sheet for Healthcare Providers: IncredibleEmployment.be  This test is not yet approved or cleared by the [place] FDA and has been authorized for detection and/or diagnosis of SARS-CoV-2 by FDA under an Emergency Use Authorization (EUA). This EUA will remain in effect (meaning this test can be used) for the duration of the COVID-19 declaration under Section 564(b)(1) of the Act, 21 U.S.C. section 360bbb-3(b)(1), unless the authorization is terminated or revoked.     Resp Syncytial Virus by PCR NEGATIVE NEGATIVE Final    Comment: (NOTE) Fact Sheet for Patients: EntrepreneurPulse.com.au  Fact Sheet for Healthcare Providers: IncredibleEmployment.be  This test is not yet approved or cleared by the [place] FDA and has been authorized for detection and/or diagnosis of SARS-CoV-2 by FDA under an Emergency Use Authorization (EUA). This EUA will remain in effect (meaning this test can be used) for the duration of the COVID-19 declaration under Section 564(b)(1) of the Act, 21  U.S.C. section 360bbb-3(b)(1), unless the authorization is terminated or revoked.  Performed at [facility], [place] [address] [place], [place] [address]   Blood culture (routine x 2)     Status: Abnormal   Collection Time: 07/29/22  5:07 PM   Specimen: BLOOD  Result Value Ref Range Status   Specimen Description   Final    BLOOD SITE NOT SPECIFIED Performed at [facility] [address] [place], [place] [address]    Special Requests   Final    BOTTLES DRAWN AEROBIC AND ANAEROBIC Blood Culture adequate volume   Culture  Setup Time   Final    GRAM POSITIVE COCCI IN CHAINS IN BOTH AEROBIC AND ANAEROBIC BOTTLES Organism ID to follow CRITICAL RESULT CALLED TO, READ BACK BY AND VERIFIED WITH: [place], AT 1137 07/30/22 [place] Performed at [facility], [place] [address] [place], [place] [address]    Culture STREPTOCOCCUS PYOGENES (A)  Final   Report Status 08/01/2022 FINAL  Final   Organism ID, Bacteria STREPTOCOCCUS PYOGENES  Final      Susceptibility   Streptococcus pyogenes - MIC*    PENICILLIN <=0.06 SENSITIVE Sensitive     CEFTRIAXONE <=0.12 SENSITIVE Sensitive     ERYTHROMYCIN <=0.12 SENSITIVE Sensitive     LEVOFLOXACIN 0.5 SENSITIVE Sensitive     VANCOMYCIN 0.25 SENSITIVE Sensitive     * STREPTOCOCCUS PYOGENES  Blood culture (routine x 2)     Status: Abnormal (Preliminary result)   Collection Time: 07/29/22  5:07 PM   Specimen: BLOOD  Result Value Ref Range Status   Specimen Description   Final    BLOOD RIGHT ANTECUBITAL Performed at [facility] [address] [place], [place] [address]    Special Requests   Final    BOTTLES DRAWN AEROBIC AND ANAEROBIC Blood Culture adequate volume Performed at [facility] [address] [place], [place] [address]    Culture  Setup Time   Final    GRAM POSITIVE COCCI IN CHAINS AEROBIC BOTTLE ONLY CRITICAL RESULT CALLED TO, READ BACK BY  AND VERIFIED WITH: GScherrie November PHARMD, AT 1137 07/30/22 D. VANHOOK    Culture (A)  Final    GROUP A STREP (S.PYOGENES) ISOLATED SUSCEPTIBILITIES PERFORMED ON PREVIOUS CULTURE WITHIN THE LAST 5 DAYS.  Performed at [facility], [place] [address], [place], [place] [address]    Report Status PENDING  Incomplete  Respiratory (~20 pathogens) panel by PCR     Status: None   Collection Time: 07/29/22  5:07 PM   Specimen: Nasopharyngeal Swab; Respiratory  Result Value Ref Range Status   Adenovirus NOT DETECTED NOT DETECTED Final   Coronavirus 229E NOT DETECTED NOT DETECTED Final    Comment: (NOTE) The Coronavirus on the Respiratory Panel, DOES NOT test for the novel  Coronavirus (2019 nCoV)    Coronavirus HKU1 NOT DETECTED NOT DETECTED Final   Coronavirus NL63 NOT DETECTED NOT DETECTED Final   Coronavirus OC43 NOT DETECTED NOT DETECTED Final   Metapneumovirus NOT DETECTED NOT DETECTED Final   Rhinovirus / Enterovirus NOT DETECTED NOT DETECTED Final   Influenza A NOT DETECTED NOT DETECTED Final   Influenza B NOT DETECTED NOT DETECTED Final   Parainfluenza Virus 1 NOT DETECTED NOT DETECTED Final   Parainfluenza Virus 2 NOT DETECTED NOT DETECTED Final   Parainfluenza Virus 3 NOT DETECTED NOT DETECTED Final   Parainfluenza Virus 4 NOT DETECTED NOT DETECTED Final   Respiratory Syncytial Virus NOT DETECTED NOT DETECTED Final   Bordetella pertussis NOT DETECTED NOT DETECTED Final   Bordetella Parapertussis NOT DETECTED NOT DETECTED Final   Chlamydophila pneumoniae NOT DETECTED NOT DETECTED Final   Mycoplasma pneumoniae NOT DETECTED NOT DETECTED Final    Comment: Performed at [facility], [place]. [address] [place],  [address]  Blood Culture ID Panel (Reflexed)     Status: Abnormal   Collection Time: 07/29/22  5:07 PM  Result Value Ref Range Status   Enterococcus faecalis NOT DETECTED NOT DETECTED Final   Enterococcus Faecium NOT DETECTED NOT DETECTED Final   Listeria monocytogenes  NOT DETECTED NOT DETECTED Final   Staphylococcus species NOT DETECTED NOT DETECTED Final   Staphylococcus aureus (BCID) NOT DETECTED NOT DETECTED Final   Staphylococcus epidermidis NOT DETECTED NOT DETECTED Final   Staphylococcus lugdunensis NOT DETECTED NOT DETECTED Final   Streptococcus species DETECTED (A) NOT DETECTED Final    Comment: CRITICAL RESULT CALLED TO, READ BACK BY AND VERIFIED WITH: GScherrie November PHARMD, AT 1137 07/30/22 D. VANHOOK    Streptococcus agalactiae NOT DETECTED NOT DETECTED Final   Streptococcus pneumoniae NOT DETECTED NOT DETECTED Final   Streptococcus pyogenes DETECTED (A) NOT DETECTED Final    Comment: CRITICAL RESULT CALLED TO, READ BACK BY AND VERIFIED WITH: GScherrie November PHARMD, AT 1137 07/30/22 D. VANHOOK    A.calcoaceticus-baumannii NOT DETECTED NOT DETECTED Final   Bacteroides fragilis NOT DETECTED NOT DETECTED Final   Enterobacterales NOT DETECTED NOT DETECTED Final   Enterobacter cloacae complex NOT DETECTED NOT DETECTED Final   Escherichia coli NOT DETECTED NOT DETECTED Final   Klebsiella aerogenes NOT DETECTED NOT DETECTED Final   Klebsiella oxytoca NOT DETECTED NOT DETECTED Final   Klebsiella pneumoniae NOT DETECTED NOT DETECTED Final   Proteus species NOT DETECTED NOT DETECTED Final   Salmonella species NOT DETECTED NOT DETECTED Final   Serratia marcescens NOT DETECTED NOT DETECTED Final   Haemophilus influenzae NOT DETECTED NOT DETECTED Final   Neisseria meningitidis NOT DETECTED NOT DETECTED Final   Pseudomonas aeruginosa NOT DETECTED NOT DETECTED Final   Stenotrophomonas maltophilia NOT DETECTED NOT DETECTED Final   Candida albicans NOT DETECTED NOT DETECTED Final   Candida auris NOT DETECTED NOT DETECTED Final   Candida glabrata NOT DETECTED NOT DETECTED Final   Candida krusei NOT DETECTED NOT  DETECTED Final   Candida parapsilosis NOT DETECTED NOT DETECTED Final   Candida tropicalis NOT DETECTED NOT DETECTED Final   Cryptococcus neoformans/gattii  NOT DETECTED NOT DETECTED Final    Comment: Performed at [facility], [place] [address], [place], [place] [address]  Culture, blood (single)     Status: None (Preliminary result)   Collection Time: 07/30/22  1:32 AM   Specimen: BLOOD  Result Value Ref Range Status   Specimen Description   Final    BLOOD PORTA CATH Performed at [facility], [place] [address], [place], [place] [address]    Special Requests   Final    BOTTLES DRAWN AEROBIC AND ANAEROBIC Blood Culture adequate volume Performed at [facility] [address], [place], [place] [address]    Culture   Final    NO GROWTH 2 DAYS Performed at [facility] [address] [place], [place] [address]    Report Status PENDING  Incomplete    Cardiology Data  TTE: No evidence of endocarditis or valvular vegetations.  Scheduled Meds:  carvedilol  12.5 mg Oral BID WC   pantoprazole  40 mg Oral Daily   Continuous Infusions:   ceFAZolin (ANCEF) IV 2 g (07/31/22 1626)    Principal Problem:   Fever Active Problems:   Essential hypertension   HFrEF (heart failure with reduced ejection fraction) (HCC)   Hyponatremia   Breast cancer metastasized to axillary lymph node, right (HCC)   Thrombocytopenia (HCC)   Streptococcal bacteremia   Port or reservoir infection   Bacteremia   LOS: 1 day    A & P  Fever Observation med/surg bed. CT head/chest/abd/pelvis negative for source of fever. UA negative. Pt does have port-a-cath that she uses for q3 month chemo that she received via [facility] in [place], [place]. Will obtain blood cx via port-a-cath. Continue with cefepime and vanco for now. Pt aware that if port-a-cath is infected, this will require removal and she will need to contact her oncologist at [facility] to have a new port-a-cath placed for her next chemo session. Will also send RVP to rule out other viral illness.  Essential hypertension Stable. Pt has been living in [place] for about 2  years. Has been getting refills on some meds from old PCP in [place]. Is only taking coreg and farxiga at present time. Continue with coreg.  HFrEF (heart failure with reduced ejection fraction) ([place]) Last echo 02-2021 with EF of 50%. Has run out of entresto. Pt evasive on how long it has been since she took entresto. Only has coreg and farxiga in her bag of meds. 90 day supply of coreg was from October 2023.  Hyponatremia Chronic. Has had sodium of 130 in October 2023. Check TSH. Check serum osm, urine osm, urine electrolytes. Start gentle NS @ 75 ml/hr.  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 Follows with outpatient [facility] oncology in [place], [place].  Thrombocytopenia (HCC) Chronic. Hold lovenox/heparin. Can be addressed with her oncologist in [place], [place] at [facility].   I have seen and examined this patient myself.   DVT prophylaxis: SCD's Code Status: Full Code Family Communication: None available Disposition Plan: Home    Kartik Fernando, DO [facility] Direct contact: see [email or web address]  7PM-7AM contact night coverage as above 08/01/2022, 3:36 PM  LOS: 1 day

## 2022-08-02 ENCOUNTER — Inpatient Hospital Stay (HOSPITAL_COMMUNITY): Payer: BLUE CROSS/BLUE SHIELD

## 2022-08-02 DIAGNOSIS — E876 Hypokalemia: Secondary | ICD-10-CM | POA: Insufficient documentation

## 2022-08-02 LAB — BASIC METABOLIC PANEL
Anion gap: 8 (ref 5–15)
BUN: 10 mg/dL (ref 8–23)
CO2: 24 mmol/L (ref 22–32)
Calcium: 8.3 mg/dL — ABNORMAL LOW (ref 8.9–10.3)
Chloride: 103 mmol/L (ref 98–111)
Creatinine, Ser: 0.78 mg/dL (ref 0.44–1.00)
GFR, Estimated: >60 mL/min (ref >60)
Glucose, Bld: 149 mg/dL — ABNORMAL HIGH (ref 70–99)
Potassium: 3.3 mmol/L — ABNORMAL LOW (ref 3.5–5.1)
Sodium: 135 mmol/L (ref 135–145)

## 2022-08-02 LAB — CBC WITH DIFFERENTIAL/PLATELET
Abs Immature Granulocytes: 0.08 10*3/uL — ABNORMAL HIGH (ref 0.00–0.07)
Basophils Absolute: 0 10*3/uL (ref 0.0–0.1)
Basophils Relative: 0 %
Eosinophils Absolute: 0.1 10*3/uL (ref 0.0–0.5)
Eosinophils Relative: 2 %
HCT: 28.3 % — ABNORMAL LOW (ref 36.0–46.0)
Hemoglobin: 8.6 g/dL — ABNORMAL LOW (ref 12.0–15.0)
Immature Granulocytes: 2 %
Lymphocytes Relative: 33 %
Lymphs Abs: 1.3 10*3/uL (ref 0.7–4.0)
MCH: 27.1 pg (ref 26.0–34.0)
MCHC: 30.4 g/dL (ref 30.0–36.0)
MCV: 89.3 fL (ref 80.0–100.0)
Monocytes Absolute: 0.3 10*3/uL (ref 0.1–1.0)
Monocytes Relative: 7 %
Neutro Abs: 2.3 10*3/uL (ref 1.7–7.7)
Neutrophils Relative %: 56 %
Platelets: 117 10*3/uL — ABNORMAL LOW (ref 150–400)
RBC: 3.17 MIL/uL — ABNORMAL LOW (ref 3.87–5.11)
RDW: 15.4 % (ref 11.5–15.5)
WBC: 4 10*3/uL (ref 4.0–10.5)
nRBC: 0 % (ref 0.0–0.2)

## 2022-08-02 MED ORDER — POTASSIUM CHLORIDE CRYS ER 20 MEQ PO TBCR
40.0000 meq | EXTENDED_RELEASE_TABLET | Freq: Once | ORAL | Status: AC
  Administered 2022-08-02: 40 meq via ORAL
  Filled 2022-08-02: qty 2

## 2022-08-02 NOTE — Assessment & Plan Note (Signed)
Initially one blood culture only was obtained. It appears that this was obtained peripherally. It was positive for strep pyogenes. ID was consulted, and the port was removed. TTE was negative for endocarditis or valvular vegetations.  The patient is receiving ancef 2 gm q 8 hours. Await ID for duration of therapy. Likely will need to have PICC placed tomorrow. Port was removed on 07/30/2022. I believe that that will make today Day #4 of therapy. Repeat blood culture drawn from port on 07/30/2022 have had no growth.

## 2022-08-02 NOTE — Progress Notes (Signed)
PROGRESS NOTE  Marie Church [id number] DOB: 26-Sep-1955 DOA: 07/29/2022 PCP: Pcp, No  Brief History   Patient is a 67 year old female with history of breast cancer, metastasized to the right axilla lymph node currently in remission, chronic systolic CHF with EF of [id number], hypertension who presented with 4 to 5 days history of fever, chills.  Recent history of covid in February.  Continues to have some upper respiratory symptoms.  She follows with oncology at [facility] for the management of her breast cancer.  Has a Port-A-Cath.  On presentation she was febrile with temperature of 103.2, tachycardic, hypertensive.  UA was not suspicious for UTI.  WC count of 7.7.  CT chest/abdomen/pelvis negative for any source of fever.  Procalcitonin equivocal. Started  broad spectrum  antibiotics with vancomycin and cefepime.  ID consulted.  Now blood cultures showing gram-positive cocci.   Cultures have grown out strep pyogenes. TTE is negative for endocarditis or valvular vegetations. The patient's antibiotics have been changed to ancef. Surveillance blood cultures have had no growth as of yet. Per infectious disease there is low suspicion for endocarditis.   Clear for PICC placement tomorrow. Today is day #4 of Ancef. (Counting since day that port was pulled.)  The patient is complaining of right shoulder pain. Will order x-rays.  Consultants  Infectious disease Interventional Radiology   Procedures  Removal of port.  Antibiotics   Anti-infectives (From admission, onward)    Start     Dose/Rate Route Frequency Ordered Stop   07/30/22 1600  vancomycin (VANCOREADY) IVPB 1750 mg/350 mL  Status:  Discontinued        1,750 mg 175 mL/hr over 120 Minutes Intravenous Every 24 hours 07/29/22 2346 07/30/22 1208   07/30/22 1400  ceFAZolin (ANCEF) IVPB 2g/100 mL premix        2 g 200 mL/hr over 30 Minutes Intravenous Every 8 hours 07/30/22 1208     07/30/22 0200  ceFEPIme  (MAXIPIME) 2 g in sodium chloride 0.9 % 100 mL IVPB  Status:  Discontinued        2 g 200 mL/hr over 30 Minutes Intravenous Every 8 hours 07/29/22 2346 07/30/22 1124   07/29/22 2145  ciprofloxacin (CIPRO) tablet 500 mg        500 mg Oral  Once 07/29/22 2142 07/29/22 2314   07/29/22 1745  vancomycin (VANCOREADY) IVPB 1500 mg/300 mL        1,500 mg 150 mL/hr over 120 Minutes Intravenous  Once 07/29/22 1736 07/29/22 2044   07/29/22 1730  ceFEPIme (MAXIPIME) 2 g in sodium chloride 0.9 % 100 mL IVPB        2 g 200 mL/hr over 30 Minutes Intravenous  Once 07/29/22 1727 07/29/22 1828        Subjective  The patient is resting comfortably in bed. No new complaints.   Objective   Vitals:  Vitals:   08/02/22 1233 08/02/22 1327  BP: (!) 189/80 (!) 162/77  Pulse: 75   Resp: 16   Temp: 98.6 F (37 C)   SpO2: 99%     Exam:  Constitutional:  The patient is awake, alert, and oriented x 3. No acute distress. Respiratory:  No increased work of breathing. No wheezes, rales, or rhonchi No tactile fremitus Cardiovascular:  Regular rate and rhythm No murmurs, ectopy, or gallups. No lateral PMI. No thrills. Abdomen:  Abdomen is soft, non-tender, non-distended No hernias, masses, or organomegaly Normoactive bowel sounds.  Musculoskeletal:  No cyanosis, clubbing,  or edema Pt is unable to flex or abduct her right upper extremity at the shoulder. She states that she has had pain in that joint for some time, but loss of mobility is relatively new. Skin:  No rashes, lesions, ulcers palpation of skin: no induration or nodules Neurologic:  CN 2-12 intact Sensation all 4 extremities intact Psychiatric:  Mental status Mood, affect appropriate Orientation to person, place, time  judgment and insight appear intact  I have personally reviewed the following:   Today's Data   Vitals:   08/02/22 1233 08/02/22 1327  BP: (!) 189/80 (!) 162/77  Pulse: 75   Resp: 16   Temp: 98.6 F (37 C)    SpO2: 99%      Lab Data  CBC    Component Value Date/Time   WBC 4.0 08/02/2022 0339   RBC 3.17 (L) 08/02/2022 0339   HGB 8.6 (L) 08/02/2022 0339   HCT 28.3 (L) 08/02/2022 0339   PLT 117 (L) 08/02/2022 0339   MCV 89.3 08/02/2022 0339   MCH 27.1 08/02/2022 0339   MCHC 30.4 08/02/2022 0339   RDW 15.4 08/02/2022 0339   LYMPHSABS 1.3 08/02/2022 0339   MONOABS 0.3 08/02/2022 0339   EOSABS 0.1 08/02/2022 0339   BASOSABS 0.0 08/02/2022 0339      Latest Ref Rng & Units 08/02/2022    3:39 AM 08/01/2022    3:56 AM 07/30/2022    5:00 AM  BMP  Glucose 70 - 99 mg/dL 149  96  94   BUN 8 - 23 mg/dL 10  10  10    Creatinine 0.44 - 1.00 mg/dL 0.78  0.65  0.75   Sodium 135 - 145 mmol/L 135  133  130   Potassium 3.5 - 5.1 mmol/L 3.3  3.7  3.9   Chloride 98 - 111 mmol/L 103  101  99   CO2 22 - 32 mmol/L 24  24  24    Calcium 8.9 - 10.3 mg/dL 8.3  8.3  8.3      Micro Data   Results for orders placed or performed during the hospital encounter of 07/29/22  Resp panel by RT-PCR (RSV, Flu A&B, Covid) Anterior Nasal Swab     Status: None   Collection Time: 07/29/22  5:07 PM   Specimen: Anterior Nasal Swab  Result Value Ref Range Status   SARS Coronavirus 2 by RT PCR NEGATIVE NEGATIVE Final    Comment: (NOTE) SARS-CoV-2 target nucleic acids are NOT DETECTED.  The SARS-CoV-2 RNA is generally detectable in upper respiratory specimens during the acute phase of infection. The lowest concentration of SARS-CoV-2 viral copies this assay can detect is 138 copies/mL. A negative result does not preclude SARS-Cov-2 infection and should not be used as the sole basis for treatment or other patient management decisions. A negative result may occur with  improper specimen collection/handling, submission of specimen other than nasopharyngeal swab, presence of viral mutation(s) within the areas targeted by this assay, and inadequate number of viral copies(<138 copies/mL). A negative result must be  combined with clinical observations, patient history, and epidemiological information. The expected result is Negative.  Fact Sheet for Patients:  EntrepreneurPulse.com.au  Fact Sheet for Healthcare Providers:  IncredibleEmployment.be  This test is no t yet approved or cleared by the [place] FDA and  has been authorized for detection and/or diagnosis of SARS-CoV-2 by FDA under an Emergency Use Authorization (EUA). This EUA will remain  in effect (meaning this test can be used) for the  duration of the COVID-19 declaration under Section 564(b)(1) of the Act, 21 U.S.C.section 360bbb-3(b)(1), unless the authorization is terminated  or revoked sooner.       Influenza A by PCR NEGATIVE NEGATIVE Final   Influenza B by PCR NEGATIVE NEGATIVE Final    Comment: (NOTE) The Xpert Xpress SARS-CoV-2/FLU/RSV plus assay is intended as an aid in the diagnosis of influenza from Nasopharyngeal swab specimens and should not be used as a sole basis for treatment. Nasal washings and aspirates are unacceptable for Xpert Xpress SARS-CoV-2/FLU/RSV testing.  Fact Sheet for Patients: EntrepreneurPulse.com.au  Fact Sheet for Healthcare Providers: IncredibleEmployment.be  This test is not yet approved or cleared by the [place] FDA and has been authorized for detection and/or diagnosis of SARS-CoV-2 by FDA under an Emergency Use Authorization (EUA). This EUA will remain in effect (meaning this test can be used) for the duration of the COVID-19 declaration under Section 564(b)(1) of the Act, 21 U.S.C. section 360bbb-3(b)(1), unless the authorization is terminated or revoked.     Resp Syncytial Virus by PCR NEGATIVE NEGATIVE Final    Comment: (NOTE) Fact Sheet for Patients: EntrepreneurPulse.com.au  Fact Sheet for Healthcare Providers: IncredibleEmployment.be  This test is not yet  approved or cleared by the [place] FDA and has been authorized for detection and/or diagnosis of SARS-CoV-2 by FDA under an Emergency Use Authorization (EUA). This EUA will remain in effect (meaning this test can be used) for the duration of the COVID-19 declaration under Section 564(b)(1) of the Act, 21 U.S.C. section 360bbb-3(b)(1), unless the authorization is terminated or revoked.  Performed at [facility], [place] [address], [place], [place] [address]   Blood culture (routine x 2)     Status: Abnormal   Collection Time: 07/29/22  5:07 PM   Specimen: BLOOD  Result Value Ref Range Status   Specimen Description   Final    BLOOD SITE NOT SPECIFIED Performed at [facility] [address] [place], [place] [address]    Special Requests   Final    BOTTLES DRAWN AEROBIC AND ANAEROBIC Blood Culture adequate volume   Culture  Setup Time   Final    GRAM POSITIVE COCCI IN CHAINS IN BOTH AEROBIC AND ANAEROBIC BOTTLES Organism ID to follow CRITICAL RESULT CALLED TO, READ BACK BY AND VERIFIED WITH: [place], AT 1137 07/30/22 [place] Performed at [facility], [place] [address], [place], [place] [address]    Culture STREPTOCOCCUS PYOGENES (A)  Final   Report Status 08/01/2022 FINAL  Final   Organism ID, Bacteria STREPTOCOCCUS PYOGENES  Final      Susceptibility   Streptococcus pyogenes - MIC*    PENICILLIN <=0.06 SENSITIVE Sensitive     CEFTRIAXONE <=0.12 SENSITIVE Sensitive     ERYTHROMYCIN <=0.12 SENSITIVE Sensitive     LEVOFLOXACIN 0.5 SENSITIVE Sensitive     VANCOMYCIN 0.25 SENSITIVE Sensitive     * STREPTOCOCCUS PYOGENES  Blood culture (routine x 2)     Status: Abnormal (Preliminary result)   Collection Time: 07/29/22  5:07 PM   Specimen: BLOOD  Result Value Ref Range Status   Specimen Description   Final    BLOOD RIGHT ANTECUBITAL Performed at [facility] [address] [place], [place] [address]     Special Requests   Final    BOTTLES DRAWN AEROBIC AND ANAEROBIC Blood Culture adequate volume Performed at [facility] [address] [place], [place] [address]  Culture  Setup Time   Final    GRAM POSITIVE COCCI IN CHAINS AEROBIC BOTTLE ONLY CRITICAL RESULT CALLED TO, READ BACK BY AND VERIFIED WITH: GScherrie November PHARMD, AT [id number] 07/30/22 D. VANHOOK    Culture (A)  Final    GROUP A STREP (S.PYOGENES) ISOLATED SUSCEPTIBILITIES PERFORMED ON PREVIOUS CULTURE WITHIN THE LAST 5 DAYS. Performed at [facility], [place] [address] [place], [place] [address]    Report Status PENDING  Incomplete  Respiratory (~20 pathogens) panel by PCR     Status: None   Collection Time: 07/29/22  5:07 PM   Specimen: Nasopharyngeal Swab; Respiratory  Result Value Ref Range Status   Adenovirus NOT DETECTED NOT DETECTED Final   Coronavirus 229E NOT DETECTED NOT DETECTED Final    Comment: (NOTE) The Coronavirus on the Respiratory Panel, DOES NOT test for the novel  Coronavirus (2019 nCoV)    Coronavirus HKU1 NOT DETECTED NOT DETECTED Final   Coronavirus NL63 NOT DETECTED NOT DETECTED Final   Coronavirus OC43 NOT DETECTED NOT DETECTED Final   Metapneumovirus NOT DETECTED NOT DETECTED Final   Rhinovirus / Enterovirus NOT DETECTED NOT DETECTED Final   Influenza A NOT DETECTED NOT DETECTED Final   Influenza B NOT DETECTED NOT DETECTED Final   Parainfluenza Virus 1 NOT DETECTED NOT DETECTED Final   Parainfluenza Virus 2 NOT DETECTED NOT DETECTED Final   Parainfluenza Virus 3 NOT DETECTED NOT DETECTED Final   Parainfluenza Virus 4 NOT DETECTED NOT DETECTED Final   Respiratory Syncytial Virus NOT DETECTED NOT DETECTED Final   Bordetella pertussis NOT DETECTED NOT DETECTED Final   Bordetella Parapertussis NOT DETECTED NOT DETECTED Final   Chlamydophila pneumoniae NOT DETECTED NOT DETECTED Final   Mycoplasma pneumoniae NOT DETECTED NOT DETECTED Final    Comment: Performed at [facility], [place]. [address] [place], [place] [address]  Blood Culture ID Panel (Reflexed)     Status: Abnormal   Collection Time: 07/29/22  5:07 PM  Result Value Ref Range Status   Enterococcus faecalis NOT DETECTED NOT DETECTED Final   Enterococcus Faecium NOT DETECTED NOT DETECTED Final   Listeria monocytogenes NOT DETECTED NOT DETECTED Final   Staphylococcus species NOT DETECTED NOT DETECTED Final   Staphylococcus aureus (BCID) NOT DETECTED NOT DETECTED Final   Staphylococcus epidermidis NOT DETECTED NOT DETECTED Final   Staphylococcus lugdunensis NOT DETECTED NOT DETECTED Final   Streptococcus species DETECTED (A) NOT DETECTED Final    Comment: CRITICAL RESULT CALLED TO, READ BACK BY AND VERIFIED WITH: GScherrie November PHARMD, AT 1137 07/30/22 D. VANHOOK    Streptococcus agalactiae NOT DETECTED NOT DETECTED Final   Streptococcus pneumoniae NOT DETECTED NOT DETECTED Final   Streptococcus pyogenes DETECTED (A) NOT DETECTED Final    Comment: CRITICAL RESULT CALLED TO, READ BACK BY AND VERIFIED WITH: GScherrie November PHARMD, AT 1137 07/30/22 D. VANHOOK    A.calcoaceticus-baumannii NOT DETECTED NOT DETECTED Final   Bacteroides fragilis NOT DETECTED NOT DETECTED Final   Enterobacterales NOT DETECTED NOT DETECTED Final   Enterobacter cloacae complex NOT DETECTED NOT DETECTED Final   Escherichia coli NOT DETECTED NOT DETECTED Final   Klebsiella aerogenes NOT DETECTED NOT DETECTED Final   Klebsiella oxytoca NOT DETECTED NOT DETECTED Final   Klebsiella pneumoniae NOT DETECTED NOT DETECTED Final   Proteus species NOT DETECTED NOT DETECTED Final   Salmonella species NOT DETECTED NOT DETECTED Final   Serratia marcescens NOT DETECTED NOT DETECTED Final   Haemophilus influenzae NOT DETECTED NOT DETECTED Final  Neisseria meningitidis NOT DETECTED NOT DETECTED Final   Pseudomonas aeruginosa NOT DETECTED NOT DETECTED Final   Stenotrophomonas maltophilia NOT DETECTED NOT DETECTED Final   Candida albicans NOT  DETECTED NOT DETECTED Final   Candida auris NOT DETECTED NOT DETECTED Final   Candida glabrata NOT DETECTED NOT DETECTED Final   Candida krusei NOT DETECTED NOT DETECTED Final   Candida parapsilosis NOT DETECTED NOT DETECTED Final   Candida tropicalis NOT DETECTED NOT DETECTED Final   Cryptococcus neoformans/gattii NOT DETECTED NOT DETECTED Final    Comment: Performed at [facility], [place] [address], [place], [place] [address]  Culture, blood (single)     Status: None (Preliminary result)   Collection Time: 07/30/22  1:32 AM   Specimen: BLOOD  Result Value Ref Range Status   Specimen Description   Final    BLOOD PORTA CATH Performed at [facility], [place] [address] [place], [place] [address]    Special Requests   Final    BOTTLES DRAWN AEROBIC AND ANAEROBIC Blood Culture adequate volume Performed at [facility] [address] [place], [place] [address]    Culture   Final    NO GROWTH 3 DAYS Performed at [facility], [place] [address], [place],  [address]    Report Status PENDING  Incomplete    Cardiology Data  TTE: No evidence of endocarditis or valvular vegetations.  Scheduled Meds:  carvedilol  12.5 mg Oral BID WC   pantoprazole  40 mg Oral Daily   Continuous Infusions:   ceFAZolin (ANCEF) IV Stopped (08/02/22 1400)    Principal Problem:   Fever Active Problems:   Essential hypertension   HFrEF (heart failure with reduced ejection fraction) (HCC)   Hyponatremia   Breast cancer metastasized to axillary lymph node, right (HCC)   Thrombocytopenia (HCC)   Right shoulder pain   Streptococcal bacteremia   Port or reservoir infection   Bacteremia   Hypokalemia   LOS: 3 days    A & P  Fever Observation med/surg bed. CT head/chest/abd/pelvis negative for source of fever. UA negative. Pt does have port-a-cath that she uses for q3 month chemo that she received via [facility] in [place], [place]. Will obtain blood cx  via port-a-cath. Continue with cefepime and vanco for now. Pt aware that if port-a-cath is infected, this will require removal and she will need to contact her oncologist at [facility] to have a new port-a-cath placed for her next chemo session. RVP was negative. Initially one blood culture only was obtained. It appears that this was obtained peripherally. It was positive for strep pyogenes. ID was consulted, and the port was removed. TTE was negative for endocarditis or valvular vegetations.  The patient is receiving ancef 2 gm q 8 hours. Await ID for duration of therapy. Likely will need to have PICC placed tomorrow. Port was removed on 07/30/2022. I believe that that will make today Day #4 of therapy. Repeat blood culture drawn from port on 07/30/2022 have had no growth.   Essential hypertension Stable. Pt has been living in [place] for about 2 years. Has been getting refills on some meds from old PCP in [place]. Is only taking coreg and farxiga at present time. Continue with coreg.  HFrEF (heart failure with reduced ejection fraction) ([place]) Last echo 02-2021 with EF of 50%. Has run out of entresto. Pt evasive on how long it has been since she took entresto. Only has coreg and farxiga in  her bag of meds. 90 day supply of coreg was from October 2023.  Hyponatremia Resolved. Na today was 135.  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 Follows with outpatient [facility] oncology in [place], [place].  Thrombocytopenia ([place]) Improved slightly. Chronic. Platelets 117 today. Hematology to follow as outpatient. No signs of bleeding or petechia.  Bacteremia Initially one blood culture only was obtained. It appears that this was obtained peripherally. It was positive for strep pyogenes. ID was consulted, and the port was removed. TTE was negative for endocarditis or valvular vegetations.  The patient is receiving ancef 2 gm q 8 hours. Await ID for duration of therapy. Likely will need to  have PICC placed tomorrow. Port was removed on 07/30/2022. I believe that that will make today Day #4 of therapy. Repeat blood culture drawn from port on 07/30/2022 have had no growth.   Streptococcal bacteremia Initially one blood culture only was obtained. It appears that this was obtained peripherally. It was positive for strep pyogenes. ID was consulted, and the port was removed. TTE was negative for endocarditis or valvular vegetations.  The patient is receiving ancef 2 gm q 8 hours. Await ID for duration of therapy. Likely will need to have PICC placed tomorrow. Port was removed on 07/30/2022. I believe that that will make today Day #4 of therapy. Repeat blood culture drawn from port on 07/30/2022 have had no growth.   Port or reservoir infection Initially one blood culture only was obtained. It appears that this was obtained peripherally. It was positive for strep pyogenes. ID was consulted, and the port was removed. TTE was negative for endocarditis or valvular vegetations.  The patient is receiving ancef 2 gm q 8 hours. Await ID for duration of therapy. Likely will need to have PICC placed tomorrow. Port was removed on 07/30/2022. I believe that that will make today Day #4 of therapy. Repeat blood culture drawn from port on 07/30/2022 have had no growth.   Hypokalemia Supplement and monitor.  Right shoulder pain Chronic, but acutely worse per patient. She states that her mobility in that joint is getting acutely worse. Will check x-ray.   I have seen and examined this patient myself.   DVT prophylaxis: SCD's Code Status: Full Code Family Communication: None available Disposition Plan: Home    Youlanda Tomassetti, DO [facility] Direct contact: see [email or web address]  7PM-7AM contact night coverage as above 08/02/2022, 5:50 PM  LOS: 1 day

## 2022-08-02 NOTE — Plan of Care (Signed)
°  Problem: Education: °Goal: Knowledge of General Education information will improve °Description: Including pain rating scale, medication(s)/side effects and non-pharmacologic comfort measures °Outcome: Progressing °  °Problem: Clinical Measurements: °Goal: Ability to maintain clinical measurements within normal limits will improve °Outcome: Progressing °Goal: Will remain free from infection °Outcome: Progressing °  °Problem: Activity: °Goal: Risk for activity intolerance will decrease °Outcome: Progressing °  °Problem: Nutrition: °Goal: Adequate nutrition will be maintained °Outcome: Progressing °  °Problem: Coping: °Goal: Level of anxiety will decrease °Outcome: Progressing °  °Problem: Elimination: °Goal: Will not experience complications related to bowel motility °Outcome: Progressing °  °Problem: Pain Managment: °Goal: General experience of comfort will improve °Outcome: Progressing °  °Problem: Safety: °Goal: Ability to remain free from injury will improve °Outcome: Progressing °  °

## 2022-08-02 NOTE — Assessment & Plan Note (Signed)
Supplement and monitor 

## 2022-08-02 NOTE — Assessment & Plan Note (Signed)
Chronic, but acutely worse per patient. She states that her mobility in that joint is getting acutely worse. Will check x-ray.

## 2022-08-03 ENCOUNTER — Other Ambulatory Visit: Payer: Self-pay

## 2022-08-03 DIAGNOSIS — R509 Fever, unspecified: Secondary | ICD-10-CM | POA: Diagnosis not present

## 2022-08-03 DIAGNOSIS — R7881 Bacteremia: Secondary | ICD-10-CM | POA: Diagnosis not present

## 2022-08-03 LAB — BASIC METABOLIC PANEL
Anion gap: 7 (ref 5–15)
BUN: 8 mg/dL (ref 8–23)
CO2: 26 mmol/L (ref 22–32)
Calcium: 8.5 mg/dL — ABNORMAL LOW (ref 8.9–10.3)
Chloride: 103 mmol/L (ref 98–111)
Creatinine, Ser: 0.6 mg/dL (ref 0.44–1.00)
GFR, Estimated: >60 mL/min (ref >60)
Glucose, Bld: 90 mg/dL (ref 70–99)
Potassium: 3.7 mmol/L (ref 3.5–5.1)
Sodium: 136 mmol/L (ref 135–145)

## 2022-08-03 LAB — CBC WITH DIFFERENTIAL/PLATELET
Abs Immature Granulocytes: 0 10*3/uL (ref 0.00–0.07)
Basophils Absolute: 0 10*3/uL (ref 0.0–0.1)
Basophils Relative: 1 %
Eosinophils Absolute: 0.1 10*3/uL (ref 0.0–0.5)
Eosinophils Relative: 2 %
HCT: 26.4 % — ABNORMAL LOW (ref 36.0–46.0)
Hemoglobin: 8.7 g/dL — ABNORMAL LOW (ref 12.0–15.0)
Lymphocytes Relative: 29 %
Lymphs Abs: 1.1 10*3/uL (ref 0.7–4.0)
MCH: 30 pg (ref 26.0–34.0)
MCHC: 33 g/dL (ref 30.0–36.0)
MCV: 91 fL (ref 80.0–100.0)
Monocytes Absolute: 0.1 10*3/uL (ref 0.1–1.0)
Monocytes Relative: 3 %
Neutro Abs: 2.5 10*3/uL (ref 1.7–7.7)
Neutrophils Relative %: 65 %
Platelets: 121 10*3/uL — ABNORMAL LOW (ref 150–400)
RBC: 2.9 MIL/uL — ABNORMAL LOW (ref 3.87–5.11)
RDW: 15.5 % (ref 11.5–15.5)
WBC: 3.9 10*3/uL — ABNORMAL LOW (ref 4.0–10.5)
nRBC: 0 % (ref 0.0–0.2)

## 2022-08-03 MED ORDER — LEVOFLOXACIN 750 MG PO TABS
750.0000 mg | ORAL_TABLET | Freq: Every day | ORAL | Status: DC
  Administered 2022-08-03 – 2022-08-04 (×2): 750 mg via ORAL
  Filled 2022-08-03 (×2): qty 1

## 2022-08-03 MED ORDER — HYDRALAZINE HCL 50 MG PO TABS
25.0000 mg | ORAL_TABLET | Freq: Three times a day (TID) | ORAL | Status: DC
  Administered 2022-08-03 – 2022-08-04 (×4): 25 mg via ORAL
  Filled 2022-08-03 (×4): qty 1

## 2022-08-03 NOTE — Progress Notes (Signed)
Mobility Specialist - Progress Note   08/03/22 1257  Mobility  Activity Ambulated with assistance in hallway  Level of Assistance Standby assist, set-up cues, supervision of patient - no hands on  Assistive Device Front wheel walker  Distance Ambulated (ft) 130 ft  Activity Response Tolerated well  Mobility Referral Yes  $Mobility charge 1 Mobility   Pt received in chair and agreed to mobility, had no c/o pain nor discomfort during session. Pt returned to chair with all needs met.   Roderick Pee Mobility Specialist

## 2022-08-03 NOTE — Progress Notes (Signed)
[facility] Infectious Diseases Follow Up Note  Patient Identification: Patient Name: Marie Church MRN: [id number] [place] Date: 07/29/2022  4:19 PM Age: 67 y.o.Today's Date: 08/03/2022  Reason for Visit: bacteremia   Principal Problem:   Fever Active Problems:   Essential hypertension   HFrEF (heart failure with reduced ejection fraction) (HCC)   Hyponatremia   Thrombocytopenia (HCC)   Right shoulder pain   Breast cancer metastasized to axillary lymph node, right (HCC)   Streptococcal bacteremia   Port or reservoir infection   Bacteremia   Hypokalemia   Antibiotics:  Vancomycin 3/13 Cefepime 3/13, cefazolin 3/14-c Ciprofloxacin 3/13-    Lines/Hardware: Left chest port removed 3/14  Interval Events: afebrile for more than 48 hrs   Assessment 67 year old female with PMH as below including rt breast cancer metastatic to the rt axillary nodes status post lumpectomy 11/2020 followed by re-excision of positive margin 12/2020 s/p chemotherapy  stopped due to thrombocytopenia on close surviellance by Oncology at [facility],  [place], [place] 07/10/22 who presented fevers and chills, weakness etc for last 4-5 days.    Strep pyogenes bacteremia: likely source is left chest port  Left chest port removed 3/14 Repeat blood cx 3/14 NG in 3 day TTE 3/15 no vegetations or endocarditis Low suspicion for endocarditis   Penicillin allergy: described as swelling that she needed to go to hospital 2 years ago and no plans for PO amoxicillin challenge currently   Thrombocytopenia - improving   Recommendations Ok to switch IV cefazolin to PO levofloxacin. EOT 08/13/22 Fu with Oncology OP for breast ca follow up ID will SO, please call with questions D/w ID Pharm D  Rest of the management as per the primary team. Thank you for the consult. Please page with pertinent questions or  concerns.  ______________________________________________________________________ Subjective patient seen and examined at the bedside. She has no complaints and doing well.   Vitals BP (!) 177/79 (BP Location: Right Leg)   Pulse 73   Temp 98.9 F (37.2 C) (Oral)   Resp 18   Ht 5\' 6"  (1.676 m)   Wt 87 kg   SpO2 98%   BMI 30.96 kg/m     Physical Exam Constitutional:  elderly black female lying in the bed, not in acute distress    Comments:   Cardiovascular:     Rate and Rhythm: Normal rate and regular rhythm.     Heart sounds: s1s2  Pulmonary:     Effort: Pulmonary effort is normal on room air     Comments:   Abdominal:     Palpations: Abdomen is soft.     Tenderness: non distended   Musculoskeletal:        General: No swelling or tenderness in peripheral joints   Skin:    Comments: left chest port bandaged C/D/I  Neurological:     General: awake, alert and oriented, following commands  Psychiatric:        Mood and Affect: Mood normal  Pertinent Microbiology Results for orders placed or performed during the hospital encounter of 07/29/22  Resp panel by RT-PCR (RSV, Flu A&B, Covid) Anterior Nasal Swab     Status: None   Collection Time: 07/29/22  5:07 PM   Specimen: Anterior Nasal Swab  Result Value Ref Range Status   SARS Coronavirus 2 by RT PCR NEGATIVE NEGATIVE Final    Comment: (NOTE) SARS-CoV-2 target nucleic acids are NOT DETECTED.  The SARS-CoV-2 RNA is generally detectable in upper respiratory specimens during the acute  phase of infection. The lowest concentration of SARS-CoV-2 viral copies this assay can detect is 138 copies/mL. A negative result does not preclude SARS-Cov-2 infection and should not be used as the sole basis for treatment or other patient management decisions. A negative result may occur with  improper specimen collection/handling, submission of specimen other than nasopharyngeal swab, presence of viral mutation(s) within  the areas targeted by this assay, and inadequate number of viral copies(<138 copies/mL). A negative result must be combined with clinical observations, patient history, and epidemiological information. The expected result is Negative.  Fact Sheet for Patients:  EntrepreneurPulse.com.au  Fact Sheet for Healthcare Providers:  IncredibleEmployment.be  This test is no t yet approved or cleared by the [place] FDA and  has been authorized for detection and/or diagnosis of SARS-CoV-2 by FDA under an Emergency Use Authorization (EUA). This EUA will remain  in effect (meaning this test can be used) for the duration of the COVID-19 declaration under Section 564(b)(1) of the Act, 21 U.S.C.section 360bbb-3(b)(1), unless the authorization is terminated  or revoked sooner.       Influenza A by PCR NEGATIVE NEGATIVE Final   Influenza B by PCR NEGATIVE NEGATIVE Final    Comment: (NOTE) The Xpert Xpress SARS-CoV-2/FLU/RSV plus assay is intended as an aid in the diagnosis of influenza from Nasopharyngeal swab specimens and should not be used as a sole basis for treatment. Nasal washings and aspirates are unacceptable for Xpert Xpress SARS-CoV-2/FLU/RSV testing.  Fact Sheet for Patients: EntrepreneurPulse.com.au  Fact Sheet for Healthcare Providers: IncredibleEmployment.be  This test is not yet approved or cleared by the [place] FDA and has been authorized for detection and/or diagnosis of SARS-CoV-2 by FDA under an Emergency Use Authorization (EUA). This EUA will remain in effect (meaning this test can be used) for the duration of the COVID-19 declaration under Section 564(b)(1) of the Act, 21 U.S.C. section 360bbb-3(b)(1), unless the authorization is terminated or revoked.     Resp Syncytial Virus by PCR NEGATIVE NEGATIVE Final    Comment: (NOTE) Fact Sheet for  Patients: EntrepreneurPulse.com.au  Fact Sheet for Healthcare Providers: IncredibleEmployment.be  This test is not yet approved or cleared by the [place] FDA and has been authorized for detection and/or diagnosis of SARS-CoV-2 by FDA under an Emergency Use Authorization (EUA). This EUA will remain in effect (meaning this test can be used) for the duration of the COVID-19 declaration under Section 564(b)(1) of the Act, 21 U.S.C. section 360bbb-3(b)(1), unless the authorization is terminated or revoked.  Performed at [facility], [place] [address] [place], [place] [address]   Blood culture (routine x 2)     Status: Abnormal   Collection Time: 07/29/22  5:07 PM   Specimen: BLOOD  Result Value Ref Range Status   Specimen Description   Final    BLOOD SITE NOT SPECIFIED Performed at [place] [address], [place], [place] [address]    Special Requests   Final    BOTTLES DRAWN AEROBIC AND ANAEROBIC Blood Culture adequate volume   Culture  Setup Time   Final    GRAM POSITIVE COCCI IN CHAINS IN BOTH AEROBIC AND ANAEROBIC BOTTLES Organism ID to follow CRITICAL RESULT CALLED TO, READ BACK BY AND VERIFIED WITH: [place], AT 1137 07/30/22 [place] Performed at [facility], [place] [address], [place], [place] [address]    Culture STREPTOCOCCUS PYOGENES (A)  Final   Report Status 08/01/2022 FINAL  Final  Organism ID, Bacteria STREPTOCOCCUS PYOGENES  Final      Susceptibility   Streptococcus pyogenes - MIC*    PENICILLIN <=0.06 SENSITIVE Sensitive     CEFTRIAXONE <=0.12 SENSITIVE Sensitive     ERYTHROMYCIN <=0.12 SENSITIVE Sensitive     LEVOFLOXACIN 0.5 SENSITIVE Sensitive     VANCOMYCIN 0.25 SENSITIVE Sensitive     * STREPTOCOCCUS PYOGENES  Blood culture (routine x 2)     Status: Abnormal (Preliminary result)   Collection Time: 07/29/22  5:07 PM   Specimen: BLOOD  Result Value Ref Range  Status   Specimen Description   Final    BLOOD RIGHT ANTECUBITAL Performed at [place] [address] [place], [place] [address]    Special Requests   Final    BOTTLES DRAWN AEROBIC AND ANAEROBIC Blood Culture adequate volume Performed at [place] [address] [place], [place] [address]    Culture  Setup Time   Final    GRAM POSITIVE COCCI IN CHAINS AEROBIC BOTTLE ONLY CRITICAL RESULT CALLED TO, READ BACK BY AND VERIFIED WITH: GScherrie November PHARMD, AT 1137 07/30/22 D. VANHOOK    Culture (A)  Final    GROUP A STREP (S.PYOGENES) ISOLATED SUSCEPTIBILITIES PERFORMED ON PREVIOUS CULTURE WITHIN THE LAST 5 DAYS. Performed at [facility], [place] [address] [place], [place] [address]    Report Status PENDING  Incomplete  Respiratory (~20 pathogens) panel by PCR     Status: None   Collection Time: 07/29/22  5:07 PM   Specimen: Nasopharyngeal Swab; Respiratory  Result Value Ref Range Status   Adenovirus NOT DETECTED NOT DETECTED Final   Coronavirus 229E NOT DETECTED NOT DETECTED Final    Comment: (NOTE) The Coronavirus on the Respiratory Panel, DOES NOT test for the novel  Coronavirus (2019 nCoV)    Coronavirus HKU1 NOT DETECTED NOT DETECTED Final   Coronavirus NL63 NOT DETECTED NOT DETECTED Final   Coronavirus OC43 NOT DETECTED NOT DETECTED Final   Metapneumovirus NOT DETECTED NOT DETECTED Final   Rhinovirus / Enterovirus NOT DETECTED NOT DETECTED Final   Influenza A NOT DETECTED NOT DETECTED Final   Influenza B NOT DETECTED NOT DETECTED Final   Parainfluenza Virus 1 NOT DETECTED NOT DETECTED Final   Parainfluenza Virus 2 NOT DETECTED NOT DETECTED Final   Parainfluenza Virus 3 NOT DETECTED NOT DETECTED Final   Parainfluenza Virus 4 NOT DETECTED NOT DETECTED Final   Respiratory Syncytial Virus NOT DETECTED NOT DETECTED Final   Bordetella pertussis NOT DETECTED NOT DETECTED Final   Bordetella Parapertussis NOT DETECTED NOT DETECTED  Final   Chlamydophila pneumoniae NOT DETECTED NOT DETECTED Final   Mycoplasma pneumoniae NOT DETECTED NOT DETECTED Final    Comment: Performed at [facility], [place]. [address], [place], [place] [address]  Blood Culture ID Panel (Reflexed)     Status: Abnormal   Collection Time: 07/29/22  5:07 PM  Result Value Ref Range Status   Enterococcus faecalis NOT DETECTED NOT DETECTED Final   Enterococcus Faecium NOT DETECTED NOT DETECTED Final   Listeria monocytogenes NOT DETECTED NOT DETECTED Final   Staphylococcus species NOT DETECTED NOT DETECTED Final   Staphylococcus aureus (BCID) NOT DETECTED NOT DETECTED Final   Staphylococcus epidermidis NOT DETECTED NOT DETECTED Final   Staphylococcus lugdunensis NOT DETECTED NOT DETECTED Final   Streptococcus species DETECTED (A) NOT DETECTED Final    Comment: CRITICAL RESULT CALLED TO, READ BACK BY AND VERIFIED WITH: [place], AT 1137 07/30/22  D. VANHOOK    Streptococcus agalactiae NOT DETECTED NOT DETECTED Final   Streptococcus pneumoniae NOT DETECTED NOT DETECTED Final   Streptococcus pyogenes DETECTED (A) NOT DETECTED Final    Comment: CRITICAL RESULT CALLED TO, READ BACK BY AND VERIFIED WITH: Bosie Helper PHARMD, AT 1137 07/30/22 D. VANHOOK    A.calcoaceticus-baumannii NOT DETECTED NOT DETECTED Final   Bacteroides fragilis NOT DETECTED NOT DETECTED Final   Enterobacterales NOT DETECTED NOT DETECTED Final   Enterobacter cloacae complex NOT DETECTED NOT DETECTED Final   Escherichia coli NOT DETECTED NOT DETECTED Final   Klebsiella aerogenes NOT DETECTED NOT DETECTED Final   Klebsiella oxytoca NOT DETECTED NOT DETECTED Final   Klebsiella pneumoniae NOT DETECTED NOT DETECTED Final   Proteus species NOT DETECTED NOT DETECTED Final   Salmonella species NOT DETECTED NOT DETECTED Final   Serratia marcescens NOT DETECTED NOT DETECTED Final   Haemophilus influenzae NOT DETECTED NOT DETECTED Final   Neisseria meningitidis NOT DETECTED NOT  DETECTED Final   Pseudomonas aeruginosa NOT DETECTED NOT DETECTED Final   Stenotrophomonas maltophilia NOT DETECTED NOT DETECTED Final   Candida albicans NOT DETECTED NOT DETECTED Final   Candida auris NOT DETECTED NOT DETECTED Final   Candida glabrata NOT DETECTED NOT DETECTED Final   Candida krusei NOT DETECTED NOT DETECTED Final   Candida parapsilosis NOT DETECTED NOT DETECTED Final   Candida tropicalis NOT DETECTED NOT DETECTED Final   Cryptococcus neoformans/gattii NOT DETECTED NOT DETECTED Final    Comment: Performed at [facility], [address], [place], [place] [address]  Culture, blood (single)     Status: None (Preliminary result)   Collection Time: 07/30/22  1:32 AM   Specimen: BLOOD  Result Value Ref Range Status   Specimen Description   Final    BLOOD PORTA CATH Performed at [facility], [place] [address] [place], [place] [address]    Special Requests   Final    BOTTLES DRAWN AEROBIC AND ANAEROBIC Blood Culture adequate volume Performed at [place] [address], [place], [place] [address]    Culture   Final    NO GROWTH 3 DAYS Performed at [facility], [place] [address] [place], [place] [address]    Report Status PENDING  Incomplete   Pertinent Lab.    Latest Ref Rng & Units 08/03/2022    4:02 AM 08/02/2022    3:39 AM 08/01/2022    3:56 AM  CBC  WBC 4.0 - 10.5 K/uL 3.9  4.0  4.6   Hemoglobin 12.0 - 15.0 g/dL 8.7  8.6  8.6   Hematocrit 36.0 - 46.0 % 26.4  28.3  27.3   Platelets 150 - 400 K/uL 121  117  100       Latest Ref Rng & Units 08/03/2022    4:02 AM 08/02/2022    3:39 AM 08/01/2022    3:56 AM  CMP  Glucose 70 - 99 mg/dL 90  149  96   BUN 8 - 23 mg/dL 8  10  10    Creatinine 0.44 - 1.00 mg/dL 0.60  0.78  0.65   Sodium 135 - 145 mmol/L 136  135  133   Potassium 3.5 - 5.1 mmol/L 3.7  3.3  3.7   Chloride 98 - 111 mmol/L 103  103  101   CO2 22 - 32 mmol/L 26  24  24    Calcium 8.9 - 10.3 mg/dL 8.5  8.3  8.3  Pertinent Imaging today Plain films and CT images have been personally visualized and interpreted; radiology reports have been reviewed. Decision making incorporated into the Impression / Recommendations.  DG Shoulder Right  Result Date: 08/02/2022 CLINICAL DATA:  Right shoulder pain for several days, no known injury, initial encounter EXAM: RIGHT SHOULDER - 2+ VIEW COMPARISON:  None Available. FINDINGS: There is no evidence of fracture or dislocation. There is no evidence of arthropathy or other focal bone abnormality. Soft tissues are unremarkable. IMPRESSION: No acute abnormality noted. Electronically Signed   By: Inez Catalina M.D.   On: 08/02/2022 12:12    I spent 55 minutes for this patient encounter including review of prior medical records, coordination of care with primary/other specialist with greater than 50% of time being face to face/counseling and discussing diagnostics/treatment plan with the patient/family.  Electronically signed by:   Rosiland Oz, MD Infectious Disease Physician [facility] Pager: [phone]

## 2022-08-03 NOTE — Progress Notes (Signed)
  Progress Note   Patient: Marie Church [id number] DOB: September 03, 1955 DOA: 07/29/2022     4 DOS: the patient was seen and examined on 08/03/2022   Brief hospital course:  Assessment and Plan: * Fever - Levaquin 750 mg PO daily per ID attending   Breast cancer metastasized to axillary lymph node, right (HCC) - Chronic. Follows with outpatient [facility] oncology in [place], [place].  Hyponatremia - Resolved   HFrEF (heart failure with reduced ejection fraction) (HCC) - Coreg 12.5 mg PO bid   Essential hypertension - Coreg as above  - Hydralazine 25 mg PO q8hr   Hypokalemia - Resolved   Bacteremia - Levaquin as above  - Appreciate ID following   Port or reservoir infection - Antibx as above   Streptococcal bacteremia - Antibx as above   Right shoulder pain - Chronic, xray with no acute abnormality   Thrombocytopenia (HCC) - Monitor   DVT prophylaxis: SCDs are ordered       Subjective: Pt seen and examined at the bedside. She continues on PO antibx's per the ID attending. Repeat blood cx's remain negative.   Physical Exam: Vitals:   08/02/22 1233 08/02/22 1327 08/02/22 2019 08/03/22 0535  BP: (!) 189/80 (!) 162/77 (!) 170/78 (!) 177/79  Pulse: 75  77 73  Resp: 16  18 18   Temp: 98.6 F (37 C)   98.9 F (37.2 C)  TempSrc: Oral   Oral  SpO2: 99%  98% 98%  Weight:      Height:       Physical Exam Constitutional:      Appearance: Normal appearance.  HENT:     Head: Normocephalic and atraumatic.     Nose: Nose normal.     Mouth/Throat:     Mouth: Mucous membranes are moist.  Cardiovascular:     Rate and Rhythm: Normal rate and regular rhythm.  Pulmonary:     Effort: Pulmonary effort is normal.  Abdominal:     General: Abdomen is flat.     Palpations: Abdomen is soft.  Musculoskeletal:        General: Normal range of motion.     Cervical back: Neck supple.  Skin:    General: Skin is warm.  Neurological:     Mental Status: She is alert and  oriented to person, place, and time.  Psychiatric:        Mood and Affect: Mood normal.     Data Reviewed:   Disposition: Status is: Inpatient  Planned Discharge Destination: Barriers to discharge: Antibx completion /selection per ID service     Time spent: 35 minutes  Author: Lucienne Minks , MD 08/03/2022 11:51 AM  For on call review [email or web address].

## 2022-08-04 ENCOUNTER — Inpatient Hospital Stay (HOSPITAL_COMMUNITY): Payer: BLUE CROSS/BLUE SHIELD

## 2022-08-04 DIAGNOSIS — R509 Fever, unspecified: Secondary | ICD-10-CM | POA: Diagnosis not present

## 2022-08-04 LAB — COMPREHENSIVE METABOLIC PANEL
ALT: 10 U/L (ref 0–44)
AST: 25 U/L (ref 15–41)
Albumin: 3 g/dL — ABNORMAL LOW (ref 3.5–5.0)
Alkaline Phosphatase: 81 U/L (ref 38–126)
Anion gap: 8 (ref 5–15)
BUN: 13 mg/dL (ref 8–23)
CO2: 25 mmol/L (ref 22–32)
Calcium: 8.7 mg/dL — ABNORMAL LOW (ref 8.9–10.3)
Chloride: 103 mmol/L (ref 98–111)
Creatinine, Ser: 0.64 mg/dL (ref 0.44–1.00)
GFR, Estimated: >60 mL/min (ref >60)
Glucose, Bld: 116 mg/dL — ABNORMAL HIGH (ref 70–99)
Potassium: 3.6 mmol/L (ref 3.5–5.1)
Sodium: 136 mmol/L (ref 135–145)
Total Bilirubin: 0.3 mg/dL (ref 0.3–1.2)
Total Protein: 9.2 g/dL — ABNORMAL HIGH (ref 6.5–8.1)

## 2022-08-04 LAB — CULTURE, BLOOD (ROUTINE X 2): Special Requests: ADEQUATE

## 2022-08-04 LAB — MAGNESIUM: Magnesium: 1.9 mg/dL (ref 1.7–2.4)

## 2022-08-04 LAB — CBC
HCT: 28.4 % — ABNORMAL LOW (ref 36.0–46.0)
Hemoglobin: 8.7 g/dL — ABNORMAL LOW (ref 12.0–15.0)
MCH: 27 pg (ref 26.0–34.0)
MCHC: 30.6 g/dL (ref 30.0–36.0)
MCV: 88.2 fL (ref 80.0–100.0)
Platelets: 143 10*3/uL — ABNORMAL LOW (ref 150–400)
RBC: 3.22 MIL/uL — ABNORMAL LOW (ref 3.87–5.11)
RDW: 15.4 % (ref 11.5–15.5)
WBC: 4.9 10*3/uL (ref 4.0–10.5)
nRBC: 0 % (ref 0.0–0.2)

## 2022-08-04 LAB — CULTURE, BLOOD (SINGLE)
Culture: NO GROWTH
Special Requests: ADEQUATE

## 2022-08-04 LAB — PHOSPHORUS: Phosphorus: 3.9 mg/dL (ref 2.5–4.6)

## 2022-08-04 MED ORDER — ISOSORBIDE DINITRATE 10 MG PO TABS
10.0000 mg | ORAL_TABLET | Freq: Three times a day (TID) | ORAL | Status: DC
  Administered 2022-08-04 (×2): 10 mg via ORAL
  Filled 2022-08-04 (×3): qty 1

## 2022-08-04 MED ORDER — LEVOFLOXACIN 750 MG PO TABS: 750.0000 mg | ORAL_TABLET | Freq: Every day | ORAL | 0 refills | Status: AC

## 2022-08-04 MED ORDER — HYDRALAZINE HCL 50 MG PO TABS: 25.0000 mg | ORAL_TABLET | Freq: Three times a day (TID) | ORAL | Status: DC

## 2022-08-04 NOTE — Discharge Summary (Signed)
Physician Discharge Summary   Patient: Marie Church MRN: [id number] DOB: 1955/06/08  Admit date:     07/29/2022  Discharge date: 08/04/22  Discharge Physician: Lucienne Minks    PCP: Pcp, No   Recommendations at discharge:    Please complete the course of levofloxacin at home on 08/13/2022  Discharge Diagnoses: Principal Problem:   Fever Active Problems:   Essential hypertension   HFrEF (heart failure with reduced ejection fraction) (HCC)   Hyponatremia   Breast cancer metastasized to axillary lymph node, right (HCC)   Thrombocytopenia (HCC)   Right shoulder pain   Streptococcal bacteremia   Port or reservoir infection   Bacteremia   Hypokalemia  Resolved Problems:   * No resolved hospital problems. *  Hospital Course: 67 year old female with history of breast cancer, metastasized to the right axilla lymph node currently in remission, chronic systolic CHF with EF of [id number], hypertension who presented with 4 to 5 days history of fever, chills.  On presentation she was febrile with temperature of 103.2, tachycardic, hypertensive.  UA was not suspicious for UTI.  WC count of 7.7.  CT chest/abdomen/pelvis negative for any source of fever.  Procalcitonin equivocal. Started broad spectrum  antibiotics with vancomycin and cefepime.  ID consulted.  Now blood cultures showing gram-positive cocci. Port was removed.    Cultures have grown out strep pyogenes. TTE is negative for endocarditis or valvular vegetations. The patient's antibiotics have been changed to ancef. Surveillance blood cultures have had no growth as of yet. Per infectious disease there is low suspicion for endocarditis.    ID attending changed the pt's antibx's to PO levaquin 750 mg PO daily.  End date will be 08/13/2022.  Also, please note on the morning of 08/04/2022 pt was complaining of L jaw pain. Mandible xray was ordered at 829 AM on 08/04/2022. It was not completed until the late afternoon (which caused the discharge  to be delayed). Xray dept was called and they said they were busy with ER pt's. Regardless, the xray was completed and is wnl. Pt will be discharged on 08/04/2022.  Assessment and Plan: * Fever Observation med/surg bed. CT head/chest/abd/pelvis negative for source of fever. UA negative. Pt does have port-a-cath that she uses for q3 month chemo that she received via [facility] in [place], [place]. Will obtain blood cx via port-a-cath. Continue with cefepime and vanco for now. Pt aware that if port-a-cath is infected, this will require removal and she will need to contact her oncologist at [facility] to have a new port-a-cath placed for her next chemo session. RVP was negative. Initially one blood culture only was obtained. It appears that this was obtained peripherally. It was positive for strep pyogenes. ID was consulted, and the port was removed. TTE was negative for endocarditis or valvular vegetations.  The patient is receiving ancef 2 gm q 8 hours. Await ID for duration of therapy. Likely will need to have PICC placed tomorrow. Port was removed on 07/30/2022. I believe that that will make today Day #4 of therapy. Repeat blood culture drawn from port on 07/30/2022 have had no growth.   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 Follows with outpatient [facility] oncology in [place], [place].  Hyponatremia Resolved. Na today was 135.  HFrEF (heart failure with reduced ejection fraction) ([place]) Last echo 02-2021 with EF of 50%. Has run out of entresto. Pt evasive on how long it has been since she took entresto. Only has coreg and farxiga  in her bag of meds. 90 day supply of coreg was from October 2023.  Essential hypertension Stable. Pt has been living in [place] for about 2 years. Has been getting refills on some meds from old PCP in [place]. Is only taking coreg and farxiga at present time. Continue with coreg.  Hypokalemia Supplement and monitor.  Bacteremia Initially  one blood culture only was obtained. It appears that this was obtained peripherally. It was positive for strep pyogenes. ID was consulted, and the port was removed. TTE was negative for endocarditis or valvular vegetations.  The patient is receiving ancef 2 gm q 8 hours. Await ID for duration of therapy. Likely will need to have PICC placed tomorrow. Port was removed on 07/30/2022. I believe that that will make today Day #4 of therapy. Repeat blood culture drawn from port on 07/30/2022 have had no growth.   Port or reservoir infection Initially one blood culture only was obtained. It appears that this was obtained peripherally. It was positive for strep pyogenes. ID was consulted, and the port was removed. TTE was negative for endocarditis or valvular vegetations.  The patient is receiving ancef 2 gm q 8 hours. Await ID for duration of therapy. Likely will need to have PICC placed tomorrow. Port was removed on 07/30/2022. I believe that that will make today Day #4 of therapy. Repeat blood culture drawn from port on 07/30/2022 have had no growth.   Streptococcal bacteremia Initially one blood culture only was obtained. It appears that this was obtained peripherally. It was positive for strep pyogenes. ID was consulted, and the port was removed. TTE was negative for endocarditis or valvular vegetations.  The patient is receiving ancef 2 gm q 8 hours. Await ID for duration of therapy. Likely will need to have PICC placed tomorrow. Port was removed on 07/30/2022. I believe that that will make today Day #4 of therapy. Repeat blood culture drawn from port on 07/30/2022 have had no growth.   Right shoulder pain Chronic, but acutely worse per patient. She states that her mobility in that joint is getting acutely worse. Will check x-ray.  Thrombocytopenia ([place]) Improved slightly. Chronic. Platelets 117 today. Hematology to follow as outpatient. No signs of bleeding or petechia.        Consultants:  ID Disposition: Home Diet recommendation:  Cardiac diet DISCHARGE MEDICATION: Allergies as of 08/04/2022       Reactions   Doxycycline Rash   Amoxicillin-pot Clavulanate Rash   Tolerates Rocephin   Lisinopril Rash        Medication List     STOP taking these medications    acetaminophen 500 MG tablet Commonly known as: TYLENOL   ibuprofen 200 MG tablet Commonly known as: ADVIL       TAKE these medications    carvedilol 12.5 MG tablet Commonly known as: COREG Take 1 tablet (12.5 mg total) by mouth 2 (two) times daily with a meal. What changed: when to take this   diclofenac Sodium 1 % Gel Commonly known as: Voltaren Apply 4 g topically 4 (four) times daily.   Entresto 24-26 MG Generic drug: sacubitril-valsartan Take 1 tablet by mouth 2 (two) times daily.   Farxiga 10 MG Tabs tablet Generic drug: dapagliflozin propanediol Take 10 mg by mouth daily.   levofloxacin 750 MG tablet Commonly known as: LEVAQUIN Take 1 tablet (750 mg total) by mouth daily for 9 days. Start taking on: August 05, 2022   spironolactone 25 MG tablet Commonly known as: ALDACTONE  Take 25 mg by mouth daily.        Follow-up [place] [facility] Follow up.   Why: Hospital follow-up/ establish PCP your appointmen is scheduled for Thursday, August 20, 2022 at  2:30pm with Geryl Rankins, NP Contact information: [address] [place] [phone]               Discharge Exam: Danley Danker Weights   08/01/22 0352 08/02/22 0500 08/04/22 0539  Weight: 80 kg 87 kg 85 kg   Condition at discharge: fair  The results of significant diagnostics from this hospitalization (including imaging, microbiology, ancillary and laboratory) are listed below for reference.   Imaging Studies: DG Mandible 4 Views  Result Date: 08/04/2022 CLINICAL DATA:  Jaw pain. EXAM: MANDIBLE - 4+ VIEW COMPARISON:  None Available.  FINDINGS: There is no evidence of fracture or other focal bone lesions. The temporomandibular joints appear congruent on provided views. No bony destructive change. No gross periapical lucency about the teeth. IMPRESSION: No radiographic explanation for pain Electronically Signed   By: Keith Rake M.D.   On: 08/04/2022 15:35   [place] EKG SITE RITE  Result Date: 08/03/2022 If Site Rite image not attached, placement could not be confirmed due to current cardiac rhythm.  DG Shoulder Right  Result Date: 08/02/2022 CLINICAL DATA:  Right shoulder pain for several days, no known injury, initial encounter EXAM: RIGHT SHOULDER - 2+ VIEW COMPARISON:  None Available. FINDINGS: There is no evidence of fracture or dislocation. There is no evidence of arthropathy or other focal bone abnormality. Soft tissues are unremarkable. IMPRESSION: No acute abnormality noted. Electronically Signed   By: Inez Catalina M.D.   On: 08/02/2022 12:12   ECHOCARDIOGRAM COMPLETE  Result Date: 07/31/2022    ECHOCARDIOGRAM REPORT   Patient Name:   LATONYIA MULDOWNEY Date of Exam: 07/31/2022 Medical Rec #:  [id number]          Height:       66.0 in Accession #:    [id number]         Weight:       174.2 lb Date of Birth:  28-Jul-1955          BSA:          1.886 m Patient Age:    10 years           BP:           122/72 mmHg Patient Gender: F                  HR:           74 bpm. Exam Location:  Inpatient Procedure: 2D Echo, Cardiac Doppler and Color Doppler Indications:    bacteremia  History:        Patient has prior history of Echocardiogram examinations. Risk                 Factors:Diabetes and Hypertension.  Sonographer:    Phineas Douglas Referring Phys: [phone] AMRIT ADHIKARI IMPRESSIONS  1. Left ventricular ejection fraction, by estimation, is 55 to 60%. The left ventricle has normal function. The left ventricle has no regional wall motion abnormalities. There is mild concentric left ventricular hypertrophy. Left ventricular diastolic  parameters were normal.  2. Right ventricular systolic function is normal. The right ventricular size is normal. Tricuspid regurgitation signal is inadequate for assessing PA pressure.  3. The mitral valve is normal  in structure. Trivial mitral valve regurgitation. No evidence of mitral stenosis.  4. The aortic valve is tricuspid. Aortic valve regurgitation is not visualized. No aortic stenosis is present.  5. The inferior vena cava is dilated in size with >50% respiratory variability, suggesting right atrial pressure of 8 mmHg. Comparison(s): Prior images reviewed side by side. Changes from prior study are noted. EF improved compared to prior. Conclusion(s)/Recommendation(s): No evidence of valvular vegetations on this transthoracic echocardiogram. Consider a transesophageal echocardiogram to exclude infective endocarditis if clinically indicated. FINDINGS  Left Ventricle: Left ventricular ejection fraction, by estimation, is 55 to 60%. The left ventricle has normal function. The left ventricle has no regional wall motion abnormalities. The left ventricular internal cavity size was normal in size. There is  mild concentric left ventricular hypertrophy. Left ventricular diastolic parameters were normal. Right Ventricle: The right ventricular size is normal. No increase in right ventricular wall thickness. Right ventricular systolic function is normal. Tricuspid regurgitation signal is inadequate for assessing PA pressure. Left Atrium: Left atrial size was normal in size. Right Atrium: Right atrial size was normal in size. Pericardium: There is no evidence of pericardial effusion. Mitral Valve: The mitral valve is normal in structure. Trivial mitral valve regurgitation. No evidence of mitral valve stenosis. Tricuspid Valve: The tricuspid valve is normal in structure. Tricuspid valve regurgitation is trivial. No evidence of tricuspid stenosis. Aortic Valve: The aortic valve is tricuspid. Aortic valve regurgitation is  not visualized. No aortic stenosis is present. Pulmonic Valve: The pulmonic valve was not well visualized. Pulmonic valve regurgitation is not visualized. No evidence of pulmonic stenosis. Aorta: The aortic root, ascending aorta, aortic arch and descending aorta are all structurally normal, with no evidence of dilitation or obstruction. Venous: The inferior vena cava is dilated in size with greater than 50% respiratory variability, suggesting right atrial pressure of 8 mmHg. IAS/Shunts: The atrial septum is grossly normal.  LEFT VENTRICLE PLAX 2D LVIDd:         4.00 cm     Diastology LVIDs:         2.70 cm     LV e' medial:    7.72 cm/s LV PW:         1.20 cm     LV E/e' medial:  11.4 LV IVS:        1.20 cm     LV e' lateral:   12.60 cm/s LVOT diam:     1.80 cm     LV E/e' lateral: 7.0 LV SV:         64 LV SV Index:   34 LVOT Area:     2.54 cm  LV Volumes (MOD) LV vol d, MOD A2C: 96.9 ml LV vol d, MOD A4C: 84.9 ml LV vol s, MOD A2C: 31.3 ml LV vol s, MOD A4C: 33.0 ml LV SV MOD A2C:     65.6 ml LV SV MOD A4C:     84.9 ml LV SV MOD BP:      59.9 ml RIGHT VENTRICLE             IVC RV Basal diam:  3.83 cm     IVC diam: 2.10 cm RV S prime:     12.70 cm/s TAPSE (M-mode): 2.2 cm LEFT ATRIUM             Index        RIGHT ATRIUM           Index LA diam:  3.60 cm 1.91 cm/m   RA Area:     15.10 cm LA Vol (A2C):   56.3 ml 29.86 ml/m  RA Volume:   39.20 ml  20.79 ml/m LA Vol (A4C):   37.8 ml 20.05 ml/m LA Biplane Vol: 46.4 ml 24.61 ml/m  AORTIC VALVE LVOT Vmax:   116.00 cm/s LVOT Vmean:  83.100 cm/s LVOT VTI:    0.250 m  AORTA Ao Root diam: 2.90 cm Ao Asc diam:  2.70 cm MITRAL VALVE MV Area (PHT): 4.39 cm    SHUNTS MV Decel Time: 173 msec    Systemic VTI:  0.25 m MV E velocity: 88.30 cm/s  Systemic Diam: 1.80 cm MV A velocity: 58.70 cm/s MV E/A ratio:  1.50 Buford Dresser MD Electronically signed by Buford Dresser MD Signature Date/Time: 07/31/2022/4:38:43 PM    Final    IR REMOVAL TUN ACCESS W/  PORT W/O FL MOD SED  Result Date: 07/30/2022 CLINICAL DATA:  67 year old female with history of breast cancer and surgically placed left subclavian port at outside hospital presenting with bacteremia. Request for Port-A-Cath removal. EXAM: REMOVAL OF IMPLANTED TUNNELED PORT-A-CATH MEDICATIONS: None. ANESTHESIA/SEDATION: None. FLUOROSCOPY TIME:  None PROCEDURE: Informed written consent was obtained from the patient after a discussion of the risk, benefits and alternatives to the procedure. The patient was positioned supine on the fluoroscopy table and the left chest Port-A-Cath site was prepped with chlorhexidine. A sterile gown and gloves were worn during the procedure. Local anesthesia was provided with 1% lidocaine with epinephrine. A timeout was performed prior to the initiation of the procedure. An incision was made overlying the Port-A-Cath with a #15 scalpel. Utilizing sharp and blunt dissection, the Port-A-Cath was removed completely. The pocket was irrigated with sterile saline. Wound closure was performed with deep dermal interrupted 3-0 Vicryl , knotless running subcuticular 4-0 monocryl, and Dermabond. A dressing was placed. The patient tolerated the procedure well without immediate post procedural complication. FINDINGS: Successful removal of implant Port-A-Cath without immediate post procedural complication. IMPRESSION: Successful removal of implanted Port-A-Cath. Ruthann Cancer, MD [facility] Electronically Signed   By: Ruthann Cancer M.D.   On: 07/30/2022 14:56   CT ABDOMEN PELVIS WO CONTRAST  Result Date: 07/29/2022 CLINICAL DATA:  Weakness and chills for 5 days, recent COVID, lower abdominal pain EXAM: CT ABDOMEN AND PELVIS WITHOUT CONTRAST TECHNIQUE: Multidetector CT imaging of the abdomen and pelvis was performed following the standard protocol without IV contrast. RADIATION DOSE REDUCTION: This exam was performed according to the  departmental dose-optimization program which includes automated exposure control, adjustment of the mA and/or kV according to patient size and/or use of iterative reconstruction technique. COMPARISON:  04/06/2021 FINDINGS: Lower chest: Please see earlier chest CT describing post radiation changes within the right lung and right breast from known right breast cancer. Hepatobiliary: Unremarkable unenhanced appearance of the liver and gallbladder. Pancreas: Unremarkable unenhanced appearance. Spleen: Unremarkable unenhanced appearance. Adrenals/Urinary Tract: Excreted contrast from earlier CT chest. No filling defects within the kidneys, ureters, or bladder. The adrenals are unremarkable. Stomach/Bowel: No bowel obstruction or ileus. Diverticulosis of the descending and sigmoid colon with no evidence of acute diverticulitis. Normal appendix right lower quadrant. No bowel wall thickening or inflammatory change. Vascular/Lymphatic: Aortic atherosclerosis. No enlarged abdominal or pelvic lymph nodes. Reproductive: Uterus and bilateral adnexa are unremarkable. Other: No free fluid or free intraperitoneal gas. No abdominal wall hernia. Musculoskeletal: No acute or destructive bony lesions. Reconstructed images demonstrate no additional findings. IMPRESSION: 1. Distal  colonic diverticulosis without evidence of acute diverticulitis. 2. No acute intra-abdominal or intrapelvic process. 3.  Aortic Atherosclerosis (ICD10-I70.0). 4. Please see earlier chest CT report describing post therapeutic changes in the right breast and lung from known breast cancer. Electronically Signed   By: Randa Ngo M.D.   On: 07/29/2022 21:27   CT Angio Chest PE W and/or Wo Contrast  Result Date: 07/29/2022 CLINICAL DATA:  Chest pain, shortness of breath, COVID 2 weeks ago right breast cancer * Tracking Code: BO * EXAM: CT ANGIOGRAPHY CHEST WITH CONTRAST TECHNIQUE: Multidetector CT imaging of the chest was performed using the standard protocol  during bolus administration of intravenous contrast. Multiplanar CT image reconstructions and MIPs were obtained to evaluate the vascular anatomy. RADIATION DOSE REDUCTION: This exam was performed according to the departmental dose-optimization program which includes automated exposure control, adjustment of the mA and/or kV according to patient size and/or use of iterative reconstruction technique. CONTRAST:  20mL OMNIPAQUE IOHEXOL 350 MG/ML SOLN COMPARISON:  03/11/2022 FINDINGS: Cardiovascular: Examination is somewhat limited by breath motion artifact. Within this limitation, no evidence of pulmonary embolism through the segmental pulmonary arterial level. Normal heart size. No pericardial effusion. Mediastinum/Nodes: Unchanged prominent left axillary lymph nodes. Thyroid gland, trachea, and esophagus demonstrate no significant findings. 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 No pleural effusion or pneumothorax. Upper Abdomen: No acute abnormality. Musculoskeletal: Skin thickening of the right breast with dystrophic calcifications and evidence of prior lumpectomy. No acute osseous findings. Review of the MIP images confirms the above findings. IMPRESSION: 1. Examination is somewhat limited by breath motion artifact. Within this limitation, no evidence of pulmonary embolism through the segmental pulmonary arterial level. 2. 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 3. Skin thickening of the right breast with dystrophic calcifications and evidence of prior lumpectomy. Unchanged prominent left axillary lymph nodes. Electronically Signed   By: Delanna Ahmadi M.D.   On: 07/29/2022 19:33   CT Head Wo Contrast  Result Date: 07/29/2022 CLINICAL DATA:  Hypertension and weakness EXAM: CT HEAD WITHOUT CONTRAST TECHNIQUE: Contiguous axial images were obtained from the base of the skull through the vertex without intravenous contrast. RADIATION DOSE REDUCTION: This exam was  performed according to the departmental dose-optimization program which includes automated exposure control, adjustment of the mA and/or kV according to patient size and/or use of iterative reconstruction technique. COMPARISON:  MRI head 06/07/2022 FINDINGS: Brain: No evidence of acute infarction, hemorrhage, hydrocephalus, extra-axial collection or mass lesion/mass effect. There is stable mild periventricular and deep white matter hypodensity, likely chronic small vessel ischemic change. Vascular: No hyperdense vessel or unexpected calcification. Skull: Normal. Negative for fracture or focal lesion. Sinuses/Orbits: There is mucosal thickening of the maxillary sinuses and ethmoid air cells. There is a small air-fluid level in the right maxillary sinus. Other: Subcutaneous lipoma is partially imaged in the upper right posterior neck measuring 4.9 x 6.4 cm similar to prior. IMPRESSION: 1. No acute intracranial process. 2. Mild chronic small vessel ischemic change. 3. Paranasal sinus disease with small air-fluid level in the right maxillary sinus. Correlate for acute sinusitis. Electronically Signed   By: Ronney Asters M.D.   On: 07/29/2022 19:21   DG Chest 2 View  Result Date: 07/29/2022 CLINICAL DATA:  Weakness EXAM: CHEST - 2 VIEW COMPARISON:  Chest x-ray 06/06/2022 FINDINGS: Left chest port catheter tip projects over the SVC, unchanged. The heart size and mediastinal contours are within normal limits. Both lungs are clear. The visualized skeletal structures are  unremarkable. IMPRESSION: No active cardiopulmonary disease. Electronically Signed   By: Ronney Asters M.D.   On: 07/29/2022 17:06    Microbiology: Results for orders placed or performed during the hospital encounter of 07/29/22  Resp panel by RT-PCR (RSV, Flu A&B, Covid) Anterior Nasal Swab     Status: None   Collection Time: 07/29/22  5:07 PM   Specimen: Anterior Nasal Swab  Result Value Ref Range Status   SARS Coronavirus 2 by RT PCR NEGATIVE  NEGATIVE Final    Comment: (NOTE) SARS-CoV-2 target nucleic acids are NOT DETECTED.  The SARS-CoV-2 RNA is generally detectable in upper respiratory specimens during the acute phase of infection. The lowest concentration of SARS-CoV-2 viral copies this assay can detect is 138 copies/mL. A negative result does not preclude SARS-Cov-2 infection and should not be used as the sole basis for treatment or other patient management decisions. A negative result may occur with  improper specimen collection/handling, submission of specimen other than nasopharyngeal swab, presence of viral mutation(s) within the areas targeted by this assay, and inadequate number of viral copies(<138 copies/mL). A negative result must be combined with clinical observations, patient history, and epidemiological information. The expected result is Negative.  Fact Sheet for Patients:  EntrepreneurPulse.com.au  Fact Sheet for Healthcare Providers:  IncredibleEmployment.be  This test is no t yet approved or cleared by the [place] FDA and  has been authorized for detection and/or diagnosis of SARS-CoV-2 by FDA under an Emergency Use Authorization (EUA). This EUA will remain  in effect (meaning this test can be used) for the duration of the COVID-19 declaration under Section 564(b)(1) of the Act, 21 U.S.C.section 360bbb-3(b)(1), unless the authorization is terminated  or revoked sooner.       Influenza A by PCR NEGATIVE NEGATIVE Final   Influenza B by PCR NEGATIVE NEGATIVE Final    Comment: (NOTE) The Xpert Xpress SARS-CoV-2/FLU/RSV plus assay is intended as an aid in the diagnosis of influenza from Nasopharyngeal swab specimens and should not be used as a sole basis for treatment. Nasal washings and aspirates are unacceptable for Xpert Xpress SARS-CoV-2/FLU/RSV testing.  Fact Sheet for Patients: EntrepreneurPulse.com.au  Fact Sheet for Healthcare  Providers: IncredibleEmployment.be  This test is not yet approved or cleared by the [place] FDA and has been authorized for detection and/or diagnosis of SARS-CoV-2 by FDA under an Emergency Use Authorization (EUA). This EUA will remain in effect (meaning this test can be used) for the duration of the COVID-19 declaration under Section 564(b)(1) of the Act, 21 U.S.C. section 360bbb-3(b)(1), unless the authorization is terminated or revoked.     Resp Syncytial Virus by PCR NEGATIVE NEGATIVE Final    Comment: (NOTE) Fact Sheet for Patients: EntrepreneurPulse.com.au  Fact Sheet for Healthcare Providers: IncredibleEmployment.be  This test is not yet approved or cleared by the [place] FDA and has been authorized for detection and/or diagnosis of SARS-CoV-2 by FDA under an Emergency Use Authorization (EUA). This EUA will remain in effect (meaning this test can be used) for the duration of the COVID-19 declaration under Section 564(b)(1) of the Act, 21 U.S.C. section 360bbb-3(b)(1), unless the authorization is terminated or revoked.  Performed at [facility], [place] [address] [place], [place] [address]   Blood culture (routine x 2)     Status: Abnormal   Collection Time: 07/29/22  5:07 PM   Specimen: BLOOD  Result Value Ref Range Status   Specimen Description   Final    BLOOD SITE NOT  SPECIFIED Performed at [facility], [place] [address] [place], [place] [address]    Special Requests   Final    BOTTLES DRAWN AEROBIC AND ANAEROBIC Blood Culture adequate volume   Culture  Setup Time   Final    GRAM POSITIVE COCCI IN CHAINS IN BOTH AEROBIC AND ANAEROBIC BOTTLES Organism ID to follow CRITICAL RESULT CALLED TO, READ BACK BY AND VERIFIED WITH: [place], AT 1137 07/30/22 [place] Performed at [facility], [place] [address], [place], [place] [address]    Culture  STREPTOCOCCUS PYOGENES (A)  Final   Report Status 08/01/2022 FINAL  Final   Organism ID, Bacteria STREPTOCOCCUS PYOGENES  Final      Susceptibility   Streptococcus pyogenes - MIC*    PENICILLIN <=0.06 SENSITIVE Sensitive     CEFTRIAXONE <=0.12 SENSITIVE Sensitive     ERYTHROMYCIN <=0.12 SENSITIVE Sensitive     LEVOFLOXACIN 0.5 SENSITIVE Sensitive     VANCOMYCIN 0.25 SENSITIVE Sensitive     * STREPTOCOCCUS PYOGENES  Blood culture (routine x 2)     Status: Abnormal   Collection Time: 07/29/22  5:07 PM   Specimen: BLOOD  Result Value Ref Range Status   Specimen Description   Final    BLOOD RIGHT ANTECUBITAL Performed at [place] [address] [place], [place] [address]    Special Requests   Final    BOTTLES DRAWN AEROBIC AND ANAEROBIC Blood Culture adequate volume Performed at [place] [address] [place], [place] [address]    Culture  Setup Time   Final    GRAM POSITIVE COCCI IN CHAINS AEROBIC BOTTLE ONLY CRITICAL RESULT CALLED TO, READ BACK BY AND VERIFIED WITH: GScherrie November PHARMD, AT 1137 07/30/22 D. VANHOOK    Culture (A)  Final    GROUP A STREP (S.PYOGENES) ISOLATED SUSCEPTIBILITIES PERFORMED ON PREVIOUS CULTURE WITHIN THE LAST 5 DAYS. Performed at [facility], [place] [address], [place], [place] [address]    Report Status 08/04/2022 FINAL  Final  Respiratory (~20 pathogens) panel by PCR     Status: None   Collection Time: 07/29/22  5:07 PM   Specimen: Nasopharyngeal Swab; Respiratory  Result Value Ref Range Status   Adenovirus NOT DETECTED NOT DETECTED Final   Coronavirus 229E NOT DETECTED NOT DETECTED Final    Comment: (NOTE) The Coronavirus on the Respiratory Panel, DOES NOT test for the novel  Coronavirus (2019 nCoV)    Coronavirus HKU1 NOT DETECTED NOT DETECTED Final   Coronavirus NL63 NOT DETECTED NOT DETECTED Final   Coronavirus OC43 NOT DETECTED NOT DETECTED Final   Metapneumovirus NOT DETECTED NOT  DETECTED Final   Rhinovirus / Enterovirus NOT DETECTED NOT DETECTED Final   Influenza A NOT DETECTED NOT DETECTED Final   Influenza B NOT DETECTED NOT DETECTED Final   Parainfluenza Virus 1 NOT DETECTED NOT DETECTED Final   Parainfluenza Virus 2 NOT DETECTED NOT DETECTED Final   Parainfluenza Virus 3 NOT DETECTED NOT DETECTED Final   Parainfluenza Virus 4 NOT DETECTED NOT DETECTED Final   Respiratory Syncytial Virus NOT DETECTED NOT DETECTED Final   Bordetella pertussis NOT DETECTED NOT DETECTED Final   Bordetella Parapertussis NOT DETECTED NOT DETECTED Final   Chlamydophila pneumoniae NOT DETECTED NOT DETECTED Final   Mycoplasma pneumoniae NOT DETECTED NOT DETECTED Final    Comment: Performed at [facility], [place] [address], [place],  [address]  Blood Culture ID Panel (Reflexed)  Status: Abnormal   Collection Time: 07/29/22  5:07 PM  Result Value Ref Range Status   Enterococcus faecalis NOT DETECTED NOT DETECTED Final   Enterococcus Faecium NOT DETECTED NOT DETECTED Final   Listeria monocytogenes NOT DETECTED NOT DETECTED Final   Staphylococcus species NOT DETECTED NOT DETECTED Final   Staphylococcus aureus (BCID) NOT DETECTED NOT DETECTED Final   Staphylococcus epidermidis NOT DETECTED NOT DETECTED Final   Staphylococcus lugdunensis NOT DETECTED NOT DETECTED Final   Streptococcus species DETECTED (A) NOT DETECTED Final    Comment: CRITICAL RESULT CALLED TO, READ BACK BY AND VERIFIED WITH: GScherrie November PHARMD, AT 1137 07/30/22 D. VANHOOK    Streptococcus agalactiae NOT DETECTED NOT DETECTED Final   Streptococcus pneumoniae NOT DETECTED NOT DETECTED Final   Streptococcus pyogenes DETECTED (A) NOT DETECTED Final    Comment: CRITICAL RESULT CALLED TO, READ BACK BY AND VERIFIED WITH: Bosie Helper PHARMD, AT 1137 07/30/22 D. VANHOOK    A.calcoaceticus-baumannii NOT DETECTED NOT DETECTED Final   Bacteroides fragilis NOT DETECTED NOT DETECTED Final   Enterobacterales NOT  DETECTED NOT DETECTED Final   Enterobacter cloacae complex NOT DETECTED NOT DETECTED Final   Escherichia coli NOT DETECTED NOT DETECTED Final   Klebsiella aerogenes NOT DETECTED NOT DETECTED Final   Klebsiella oxytoca NOT DETECTED NOT DETECTED Final   Klebsiella pneumoniae NOT DETECTED NOT DETECTED Final   Proteus species NOT DETECTED NOT DETECTED Final   Salmonella species NOT DETECTED NOT DETECTED Final   Serratia marcescens NOT DETECTED NOT DETECTED Final   Haemophilus influenzae NOT DETECTED NOT DETECTED Final   Neisseria meningitidis NOT DETECTED NOT DETECTED Final   Pseudomonas aeruginosa NOT DETECTED NOT DETECTED Final   Stenotrophomonas maltophilia NOT DETECTED NOT DETECTED Final   Candida albicans NOT DETECTED NOT DETECTED Final   Candida auris NOT DETECTED NOT DETECTED Final   Candida glabrata NOT DETECTED NOT DETECTED Final   Candida krusei NOT DETECTED NOT DETECTED Final   Candida parapsilosis NOT DETECTED NOT DETECTED Final   Candida tropicalis NOT DETECTED NOT DETECTED Final   Cryptococcus neoformans/gattii NOT DETECTED NOT DETECTED Final    Comment: Performed at [facility], [place]. [address], [place], [place] [address]  Culture, blood (single)     Status: None   Collection Time: 07/30/22  1:32 AM   Specimen: BLOOD  Result Value Ref [place] Status   Specimen Description   Final    BLOOD PORTA CATH Performed at [facility], [place] [address] [place], [place] [address]    Special Requests   Final    BOTTLES DRAWN AEROBIC AND ANAEROBIC Blood Culture adequate volume Performed at [facility] [address], [place], [place] [address]    Culture   Final    NO GROWTH 5 DAYS Performed at [facility], [place] [address] [place],  [address]    Report Status 08/04/2022 FINAL  Final    Labs: CBC: Recent Labs  Lab 07/29/22 1707 07/30/22 0500 08/01/22 0356 08/02/22 0339 08/03/22 0402 08/04/22 0420  WBC 7.7 8.8 4.6 4.0 3.9* 4.9   NEUTROABS 4.9 6.7 3.5 2.3 2.5  --   HGB 10.5* 10.1* 8.6* 8.6* 8.7* 8.7*  HCT 33.1* 32.0* 27.3* 28.3* 26.4* 28.4*  MCV 92.2 90.4 89.2 89.3 91.0 88.2  PLT 106* 103* 100* 117* 121* [id number]*   Basic Metabolic Panel: Recent Labs  Lab 07/30/22 0500 08/01/22 0356 08/02/22 0339 08/03/22 0402 08/04/22 0420  NA 130* 133* 135 136 136  K  3.9 3.7 3.3* 3.7 3.6  CL 99 101 103 103 103  CO2 24 24 24 26 25   GLUCOSE 94 96 149* 90 116*  BUN 10 10 10 8 13   CREATININE 0.75 0.65 0.78 0.60 0.64  CALCIUM 8.3* 8.3* 8.3* 8.5* 8.7*  MG 1.6*  --   --   --  1.9  PHOS  --   --   --   --  3.9   Liver Function Tests: Recent Labs  Lab 07/29/22 1707 07/30/22 0500 08/04/22 0420  AST 36 29 25  ALT 33 27 10  ALKPHOS 102 92 81  BILITOT 0.6 1.1 0.3  PROT 9.8* 9.0* 9.2*  ALBUMIN 3.9 3.2* 3.0*   CBG: No results for input(s): "GLUCAP" in the last 168 hours.  Discharge time spent: greater than 30 minutes.  Signed: Lucienne Minks , MD [facility] 08/04/2022

## 2022-08-20 ENCOUNTER — Other Ambulatory Visit: Payer: Self-pay

## 2022-08-20 ENCOUNTER — Ambulatory Visit: Payer: BLUE CROSS/BLUE SHIELD | Attending: Nurse Practitioner | Admitting: Nurse Practitioner

## 2022-08-20 ENCOUNTER — Encounter: Payer: Self-pay | Admitting: Nurse Practitioner

## 2022-08-20 VITALS — BP 111/74 | HR 71 | Ht 66.0 in | Wt 178.0 lb

## 2022-08-20 DIAGNOSIS — I509 Heart failure, unspecified: Secondary | ICD-10-CM

## 2022-08-20 DIAGNOSIS — Z1211 Encounter for screening for malignant neoplasm of colon: Secondary | ICD-10-CM | POA: Diagnosis not present

## 2022-08-20 DIAGNOSIS — I10 Essential (primary) hypertension: Secondary | ICD-10-CM | POA: Diagnosis not present

## 2022-08-20 DIAGNOSIS — E1165 Type 2 diabetes mellitus with hyperglycemia: Secondary | ICD-10-CM | POA: Diagnosis not present

## 2022-08-20 DIAGNOSIS — H538 Other visual disturbances: Secondary | ICD-10-CM | POA: Diagnosis not present

## 2022-08-20 DIAGNOSIS — E559 Vitamin D deficiency, unspecified: Secondary | ICD-10-CM

## 2022-08-20 DIAGNOSIS — Z1159 Encounter for screening for other viral diseases: Secondary | ICD-10-CM

## 2022-08-20 DIAGNOSIS — Z780 Asymptomatic menopausal state: Secondary | ICD-10-CM

## 2022-08-20 DIAGNOSIS — D649 Anemia, unspecified: Secondary | ICD-10-CM

## 2022-08-20 DIAGNOSIS — Z1231 Encounter for screening mammogram for malignant neoplasm of breast: Secondary | ICD-10-CM

## 2022-08-20 DIAGNOSIS — R790 Abnormal level of blood mineral: Secondary | ICD-10-CM

## 2022-08-20 DIAGNOSIS — R7989 Other specified abnormal findings of blood chemistry: Secondary | ICD-10-CM

## 2022-08-20 MED ORDER — CARVEDILOL 12.5 MG PO TABS
12.5000 mg | ORAL_TABLET | Freq: Two times a day (BID) | ORAL | 1 refills | Status: DC
Start: 2022-08-20 — End: 2022-12-21
  Filled 2022-08-20: qty 180, 90d supply, fill #0
  Filled 2022-10-23: qty 60, 30d supply, fill #1

## 2022-08-20 MED ORDER — FARXIGA 10 MG PO TABS
10.0000 mg | ORAL_TABLET | Freq: Every day | ORAL | 1 refills | Status: DC
Start: 2022-08-20 — End: 2023-04-29
  Filled 2022-08-20: qty 90, 90d supply, fill #0
  Filled 2022-08-20: qty 30, 30d supply, fill #0
  Filled 2022-10-23: qty 30, 30d supply, fill #1
  Filled 2022-12-03: qty 30, 30d supply, fill #2
  Filled 2023-01-12: qty 30, 30d supply, fill #3
  Filled 2023-04-05: qty 30, 30d supply, fill #4

## 2022-08-20 MED ORDER — ENTRESTO 24-26 MG PO TABS
1.0000 | ORAL_TABLET | Freq: Two times a day (BID) | ORAL | 1 refills | Status: DC
Start: 2022-08-20 — End: 2023-04-29
  Filled 2022-08-20: qty 180, 90d supply, fill #0
  Filled 2022-12-03 – 2023-04-05 (×2): qty 180, 90d supply, fill #1

## 2022-08-20 MED ORDER — SPIRONOLACTONE 25 MG PO TABS
25.0000 mg | ORAL_TABLET | Freq: Every day | ORAL | 1 refills | Status: DC
Start: 2022-08-20 — End: 2022-12-21
  Filled 2022-08-20: qty 90, 90d supply, fill #0
  Filled 2022-12-03: qty 90, 90d supply, fill #1

## 2022-08-20 NOTE — Progress Notes (Signed)
Assessment & Plan:  Marie Church was seen today for establish care.  Diagnoses and all orders for this visit:  Type 2 diabetes mellitus with hyperglycemia, without long-term current use of insulin -     Microalbumin / creatinine urine ratio -     FARXIGA 10 MG TABS tablet; Take 1 tablet (10 mg total) by mouth daily. -     Hemoglobin A1c  Primary hypertension -     carvedilol (COREG) 12.5 MG tablet; Take 1 tablet (12.5 mg total) by mouth 2 (two) times daily with a meal. Continue all antihypertensives as prescribed.  Reminded to bring in blood pressure log for follow  up appointment.  RECOMMENDATIONS: DASH/Mediterranean Diets are healthier choices for HTN.    Blurred vision, bilateral -     Ambulatory referral to Ophthalmology  Colon cancer screening -     Ambulatory referral to Gastroenterology  Chronic congestive heart failure, unspecified heart failure type -     sacubitril-valsartan (ENTRESTO) 24-26 MG; Take 1 tablet by mouth 2 (two) times daily. -     spironolactone (ALDACTONE) 25 MG tablet; Take 1 tablet (25 mg total) by mouth daily. -     Ambulatory referral to Cardiology -     carvedilol (COREG) 12.5 MG tablet; Take 1 tablet (12.5 mg total) by mouth 2 (two) times daily with a meal.  Vitamin D deficiency -     VITAMIN D 25 Hydroxy (Vit-D Deficiency, Fractures)  Low magnesium level -     Magnesium  Need for hepatitis C screening test -     HCV Ab w Reflex to Quant PCR  Anemia, unspecified type -     CBC with Differential  Low serum calcium -     CMP14+EGFR  Postmenopausal estrogen deficiency -     DG Bone Density; Future  Breast cancer screening by mammogram -     MM 3D SCREENING MAMMOGRAM BILATERAL BREAST; Future Follow up with Oncology     Patient has been counseled on age-appropriate routine health concerns for screening and prevention. These are reviewed and up-to-date. Referrals have been placed accordingly. Immunizations are up-to-date or declined.     Subjective:   Chief Complaint  Patient presents with   Establish Care   HPI Marie Church 67 y.o. female presents to office today to establish care. She has been in [place] for 2 years.   Patient has been counseled on age-appropriate routine health concerns for screening and prevention. These are reviewed and up-to-date. Referrals have been placed accordingly. Immunizations are up-to-date or declined.     COLONOSCOPY: OVERDUE. REFERRAL Placed. MAMMOGRAM: OVERDUE. REFERRAL PLACED PAP SMEAR: OVERDUE. To be scheduled  She has a past medical history of Breast cancer with mets to axillary right lymph node (s/P neoadjuvant TCHP and right partial mastectomy 11-2020), DM type 2, CHF with reduced EF (CHEMO induced) Thrombocytopenia, and Hypertension.    Port recently removed due to [facility] on blood cultures in March. Will need oncology to place a new port [facility] in [place]) She completed radiation in 04/2021. Completed 5 cycles Kadcyla 06/20/21 - stopped due to low platelets   DM 2 Well controlled. Currently taking farxiga 10 mg daily. Notes blurred vision for greater than 1 year. Need ophthalmology referral.  Lab Results  Component Value Date   HGBA1C 5.3 08/20/2022    Lab Results  Component Value Date   HGBA1C 6.4 (H) 10/04/2020     HTN Well controlled with carvedilol 12.5 mg BID, entresto 24-26 mg BID  and spironolactone 25 mg daily. She needs referral to Cardiology here in [facility] for yearly follow ups.  BP Readings from Last 3 Encounters:  08/20/22 111/74  08/04/22 (!) 158/67  07/10/22 138/80      Review of Systems  Constitutional:  Negative for fever, malaise/fatigue and weight loss.  HENT: Negative.  Negative for nosebleeds.   Eyes:  Positive for blurred vision. Negative for double vision and photophobia.  Respiratory: Negative.  Negative for cough and shortness of breath.   Cardiovascular: Negative.  Negative for chest pain, palpitations and leg swelling.   Gastrointestinal: Negative.  Negative for heartburn, nausea and vomiting.  Musculoskeletal: Negative.  Negative for myalgias.  Neurological:  Negative for focal weakness, seizures and headaches.  Psychiatric/Behavioral: Negative.  Negative for suicidal ideas.     Past Medical History:  Diagnosis Date   Breast cancer    DM type 2 (diabetes mellitus, type 2)    reportedly steroid induced   Hypertension     Past Surgical History:  Procedure Laterality Date   BREAST LUMPECTOMY Right 11/30/2020   BRONCHIAL BIOPSY  04/09/2021   Procedure: BRONCHIAL BIOPSIES;  Surgeon: Omar Person, MD;  Location: [facility];  Service: Pulmonary;;   BRONCHIAL NEEDLE ASPIRATION BIOPSY  04/09/2021   Procedure: BRONCHIAL NEEDLE ASPIRATION BIOPSIES;  Surgeon: Omar Person, MD;  Location: [facility];  Service: Pulmonary;;   BRONCHIAL WASHINGS  04/09/2021   Procedure: BRONCHIAL WASHINGS;  Surgeon: Omar Person, MD;  Location: [facility];  Service: Pulmonary;;   ENDOBRONCHIAL ULTRASOUND N/A 04/09/2021   Procedure: ENDOBRONCHIAL ULTRASOUND;  Surgeon: Omar Person, MD;  Location: [facility];  Service: Pulmonary;  Laterality: N/A;   HEMOSTASIS CONTROL  04/09/2021   Procedure: HEMOSTASIS CONTROL;  Surgeon: Omar Person, MD;  Location: [facility];  Service: Pulmonary;;   IR REMOVAL TUN ACCESS W/ PORT W/O FL MOD SED  07/30/2022   RIGHT/LEFT HEART CATH AND CORONARY ANGIOGRAPHY N/A 10/10/2020   Procedure: RIGHT/LEFT HEART CATH AND CORONARY ANGIOGRAPHY;  Surgeon: Dolores Patty, MD;  Location: [facility]VAS[facility];  Service: Cardiovascular;  Laterality: N/A;   VIDEO BRONCHOSCOPY N/A 04/09/2021   Procedure: VIDEO BRONCHOSCOPY WITH FLUORO;  Surgeon: Omar Person, MD;  Location: [facility];  Service: Pulmonary;  Laterality: N/A;    Family History  Problem Relation Age of Onset   Heart failure Neg Hx    Heart disease Neg Hx    Heart attack Neg Hx     Social History  Reviewed with no changes to be made today.   Outpatient Medications Prior to Visit  Medication Sig Dispense Refill   diclofenac Sodium (VOLTAREN) 1 % GEL Apply 4 g topically 4 (four) times daily. 150 g 0   carvedilol (COREG) 12.5 MG tablet Take 1 tablet (12.5 mg total) by mouth 2 (two) times daily with a meal. (Patient taking differently: Take 12.5 mg by mouth daily.) 60 tablet 0   sacubitril-valsartan (ENTRESTO) 24-26 MG Take 1 tablet by mouth 2 (two) times daily.     spironolactone (ALDACTONE) 25 MG tablet Take 25 mg by mouth daily.     FARXIGA 10 MG TABS tablet Take 10 mg by mouth daily. (Patient not taking: Reported on 08/20/2022)     No facility-administered medications prior to visit.    Allergies  Allergen Reactions   Doxycycline Rash   Amoxicillin-Pot Clavulanate Rash    Tolerates Rocephin   Lisinopril Rash       Objective:    BP 111/74  Pulse 71   Ht 5\' 6"  (1.676 m)   Wt 178 lb (80.7 kg)   SpO2 97%   BMI 28.73 kg/m  Wt Readings from Last 3 Encounters:  08/20/22 178 lb (80.7 kg)  08/04/22 187 lb 6.3 oz (85 kg)  07/10/22 176 lb (79.8 kg)    Physical Exam Vitals and nursing note reviewed.  Constitutional:      Appearance: She is well-developed.  HENT:     Head: Normocephalic and atraumatic.  Cardiovascular:     Rate and Rhythm: Normal rate and regular rhythm.     Heart sounds: Normal heart sounds. No murmur heard.    No friction rub. No gallop.  Pulmonary:     Effort: Pulmonary effort is normal. No tachypnea or respiratory distress.     Breath sounds: Normal breath sounds. No decreased breath sounds, wheezing, rhonchi or rales.  Chest:     Chest wall: No tenderness.  Abdominal:     General: Bowel sounds are normal.     Palpations: Abdomen is soft.  Musculoskeletal:        General: Normal range of motion.     Cervical back: Normal range of motion.  Skin:    General: Skin is warm and dry.  Neurological:     Mental Status: She is alert and oriented to  person, place, and time.     Coordination: Coordination normal.  Psychiatric:        Behavior: Behavior normal. Behavior is cooperative.        Thought Content: Thought content normal.        Judgment: Judgment normal.          Patient has been counseled extensively about nutrition and exercise as well as the importance of adherence with medications and regular follow-up. The patient was given clear instructions to go to ER or return to medical center if symptoms don't improve, worsen or new problems develop. The patient verbalized understanding.   Follow-up: Return for PAP SMEAR in 3-4 months.   Claiborne RiggZelda W Kaleia Longhi, FNP-BC [facility] [place], [place] [phone]   08/24/2022, 2:52 PM

## 2022-08-21 LAB — CMP14+EGFR
ALT: 18 IU/L (ref 0–32)
AST: 27 IU/L (ref 0–40)
Albumin/Globulin Ratio: 0.7 — ABNORMAL LOW (ref 1.2–2.2)
Albumin: 3.9 g/dL (ref 3.9–4.9)
Alkaline Phosphatase: 110 IU/L (ref 44–121)
BUN/Creatinine Ratio: 20 (ref 12–28)
BUN: 15 mg/dL (ref 8–27)
Bilirubin Total: 0.7 mg/dL (ref 0.0–1.2)
CO2: 26 mmol/L (ref 20–29)
Calcium: 9.3 mg/dL (ref 8.7–10.3)
Chloride: 103 mmol/L (ref 96–106)
Creatinine, Ser: 0.75 mg/dL (ref 0.57–1.00)
Globulin, Total: 5.4 g/dL — ABNORMAL HIGH (ref 1.5–4.5)
Glucose: 136 mg/dL — ABNORMAL HIGH (ref 70–99)
Potassium: 4 mmol/L (ref 3.5–5.2)
Sodium: 140 mmol/L (ref 134–144)
Total Protein: 9.3 g/dL — ABNORMAL HIGH (ref 6.0–8.5)
eGFR: 89 mL/min/{1.73_m2} (ref >59)

## 2022-08-21 LAB — CBC WITH DIFFERENTIAL/PLATELET
Basophils Absolute: 0 10*3/uL (ref 0.0–0.2)
Basos: 0 %
EOS (ABSOLUTE): 0.1 10*3/uL (ref 0.0–0.4)
Eos: 2 %
Hematocrit: 31.1 % — ABNORMAL LOW (ref 34.0–46.6)
Hemoglobin: 10.3 g/dL — ABNORMAL LOW (ref 11.1–15.9)
Immature Grans (Abs): 0 10*3/uL (ref 0.0–0.1)
Immature Granulocytes: 1 %
Lymphocytes Absolute: 1.7 10*3/uL (ref 0.7–3.1)
Lymphs: 50 %
MCH: 28.5 pg (ref 26.6–33.0)
MCHC: 33.1 g/dL (ref 31.5–35.7)
MCV: 86 fL (ref 79–97)
Monocytes Absolute: 0.2 10*3/uL (ref 0.1–0.9)
Monocytes: 6 %
Neutrophils Absolute: 1.4 10*3/uL (ref 1.4–7.0)
Neutrophils: 41 %
Platelets: 120 10*3/uL — ABNORMAL LOW (ref 150–450)
RBC: 3.61 x10E6/uL — ABNORMAL LOW (ref 3.77–5.28)
RDW: 15.8 % — ABNORMAL HIGH (ref 11.7–15.4)
WBC: 3.4 10*3/uL (ref 3.4–10.8)

## 2022-08-21 LAB — MICROALBUMIN / CREATININE URINE RATIO
Creatinine, Urine: 222.5 mg/dL
Microalb/Creat Ratio: 60 mg/g creat — ABNORMAL HIGH (ref 0–29)
Microalbumin, Urine: 132.5 ug/mL

## 2022-08-21 LAB — MAGNESIUM: Magnesium: 2 mg/dL (ref 1.6–2.3)

## 2022-08-21 LAB — HCV INTERPRETATION

## 2022-08-21 LAB — VITAMIN D 25 HYDROXY (VIT D DEFICIENCY, FRACTURES): Vit D, 25-Hydroxy: 31.9 ng/mL (ref 30.0–100.0)

## 2022-08-21 LAB — HEMOGLOBIN A1C
Est. average glucose Bld gHb Est-mCnc: 105 mg/dL
Hgb A1c MFr Bld: 5.3 % (ref 4.8–5.6)

## 2022-08-21 LAB — HCV AB W REFLEX TO QUANT PCR: HCV Ab: NONREACTIVE

## 2022-08-23 ENCOUNTER — Other Ambulatory Visit: Payer: Self-pay | Admitting: Nurse Practitioner

## 2022-08-23 DIAGNOSIS — D599 Acquired hemolytic anemia, unspecified: Secondary | ICD-10-CM

## 2022-08-23 DIAGNOSIS — R779 Abnormality of plasma protein, unspecified: Secondary | ICD-10-CM

## 2022-08-23 DIAGNOSIS — D649 Anemia, unspecified: Secondary | ICD-10-CM

## 2022-08-24 ENCOUNTER — Telehealth: Payer: Self-pay

## 2022-08-24 ENCOUNTER — Encounter: Payer: Self-pay | Admitting: Nurse Practitioner

## 2022-08-24 NOTE — Telephone Encounter (Signed)
Copied from CRM [id number]. Topic: General - Other >> Aug 24, 2022 11:58 AM Macon Large wrote: Reason for CRM: Pt requests call back to go over most recent lab results. Cb# [id number]

## 2022-09-10 ENCOUNTER — Telehealth: Payer: Self-pay | Admitting: *Deleted

## 2022-09-10 NOTE — Telephone Encounter (Signed)
  Chief Complaint: Results Symptoms: NA Frequency: NA Pertinent Negatives: Patient denies NA Disposition: ED /[] Urgent Care (no appt availability in office) / Appointment(In office/virtual)/  [place] [facility]/ Home Care/ Refused Recommended Disposition /[]  Mobile Bus/  Follow-up with PCP Additional Notes:  Result note read to pt, verbalizes understanding.    "Hemoglobin improved however still shows mild anemia.  Follow-up with [facility] for colonoscopy.  Also with elevated protein levels.  Will also be referring to hematology for further workup of anemia and high protein levels.  They will call you to schedule some make sure you are checking your messages.  Make sure you are drinking at least 64 to 80 ounces of water per day.     Vit D is minimally low NORMAL. You can take over the counter vitamin D 2000 units daily to help improve this.     Normal magnesium levels   Negative for hepatitis C   A1c is normal and does not indicate any diabetes or prediabetes"

## 2022-09-22 ENCOUNTER — Encounter (HOSPITAL_COMMUNITY): Payer: Self-pay

## 2022-09-22 ENCOUNTER — Emergency Department (HOSPITAL_COMMUNITY)
Admission: EM | Admit: 2022-09-22 | Discharge: 2022-09-23 | Disposition: A | Discharge status: Home / Self Care | Payer: Medicare Other | Attending: Emergency Medicine | Admitting: Emergency Medicine

## 2022-09-22 ENCOUNTER — Emergency Department (HOSPITAL_COMMUNITY): Payer: Medicare Other

## 2022-09-22 DIAGNOSIS — Z853 Personal history of malignant neoplasm of breast: Secondary | ICD-10-CM | POA: Insufficient documentation

## 2022-09-22 DIAGNOSIS — I509 Heart failure, unspecified: Secondary | ICD-10-CM | POA: Diagnosis not present

## 2022-09-22 DIAGNOSIS — E119 Type 2 diabetes mellitus without complications: Secondary | ICD-10-CM | POA: Diagnosis not present

## 2022-09-22 DIAGNOSIS — R0602 Shortness of breath: Secondary | ICD-10-CM | POA: Insufficient documentation

## 2022-09-22 DIAGNOSIS — I110 Hypertensive heart disease with heart failure: Secondary | ICD-10-CM | POA: Diagnosis not present

## 2022-09-22 MED ORDER — ALBUTEROL SULFATE HFA 108 (90 BASE) MCG/ACT IN AERS: 2.0000 | INHALATION_SPRAY | RESPIRATORY_TRACT | Status: DC | PRN

## 2022-09-22 NOTE — ED Triage Notes (Signed)
Patient reports shortness of breath (for a while) and dizziness (that comes and goes). Patient is unable to state when she first noticed the symptoms.

## 2022-09-23 LAB — CBC
HCT: 33.8 % — ABNORMAL LOW (ref 36.0–46.0)
Hemoglobin: 10.3 g/dL — ABNORMAL LOW (ref 12.0–15.0)
MCH: 27.5 pg (ref 26.0–34.0)
MCHC: 30.5 g/dL (ref 30.0–36.0)
MCV: 90.1 fL (ref 80.0–100.0)
Platelets: 107 10*3/uL — ABNORMAL LOW (ref 150–400)
RBC: 3.75 MIL/uL — ABNORMAL LOW (ref 3.87–5.11)
RDW: 16.1 % — ABNORMAL HIGH (ref 11.5–15.5)
WBC: 3.2 10*3/uL — ABNORMAL LOW (ref 4.0–10.5)
nRBC: 0 % (ref 0.0–0.2)

## 2022-09-23 LAB — BASIC METABOLIC PANEL
Anion gap: 5 (ref 5–15)
BUN: 20 mg/dL (ref 8–23)
CO2: 26 mmol/L (ref 22–32)
Calcium: 8.6 mg/dL — ABNORMAL LOW (ref 8.9–10.3)
Chloride: 102 mmol/L (ref 98–111)
Creatinine, Ser: 1.04 mg/dL — ABNORMAL HIGH (ref 0.44–1.00)
GFR, Estimated: 60 mL/min — ABNORMAL LOW (ref >60)
Glucose, Bld: 104 mg/dL — ABNORMAL HIGH (ref 70–99)
Potassium: 3.5 mmol/L (ref 3.5–5.1)
Sodium: 133 mmol/L — ABNORMAL LOW (ref 135–145)

## 2022-09-23 NOTE — Discharge Instructions (Addendum)
Follow up with your primary care provider for recheck and further work up. Return to the ER for worsening or concerning symptoms.

## 2022-09-23 NOTE — ED Provider Notes (Signed)
[name] EMERGENCY DEPARTMENT AT [facility] Provider Note   CSN: [id number] Arrival date & time: 09/22/22  2242     History  Chief Complaint  Patient presents with   Shortness of Breath   Dizziness    Marie Church is a 67 y.o. female.  67 yo female with complaint of SHOB and feeling dizzy (described as vision is bad, has been bad for a long time, no changes in gait or speech) for the past month or so, came to the ER tonight with her daughter with same complaint. Notes they were both indoors at the same place when symptoms started but others in the same room were not feeling ill. Denies drugs or alcohol. Taking new vitamins (D3, C, B12, mg). Denies fevers, chills, cough, congestion, CP, lower extremity edema.        Home Medications Prior to Admission medications   Medication Sig Start Date End Date Taking? Authorizing Provider  carvedilol (COREG) 12.5 MG tablet Take 1 tablet (12.5 mg total) by mouth 2 (two) times daily with a meal. 08/20/22   Claiborne Rigg, NP  diclofenac Sodium (VOLTAREN) 1 % GEL Apply 4 g topically 4 (four) times daily. 06/07/22   Rolan Bucco, MD  FARXIGA 10 MG TABS tablet Take 1 tablet (10 mg total) by mouth daily. 08/20/22   Claiborne Rigg, NP  sacubitril-valsartan (ENTRESTO) 24-26 MG Take 1 tablet by mouth 2 (two) times daily. 08/20/22   Claiborne Rigg, NP  spironolactone (ALDACTONE) 25 MG tablet Take 1 tablet (25 mg total) by mouth daily. 08/20/22   Claiborne Rigg, NP      Allergies    Doxycycline, Amoxicillin-pot clavulanate, and Lisinopril    Review of Systems   Review of Systems Negative except as per  HPI Physical Exam Updated Vital Signs BP (!) 148/101   Pulse (!) 55   Temp 98.5 F (36.9 C) (Oral)   Resp 17   Ht 5\' 6"  (1.676 m)   Wt 80.7 kg   SpO2 98%   BMI 28.73 kg/m  Physical Exam Vitals and nursing note reviewed.  Constitutional:      General: She is not in acute distress.    Appearance: She is  well-developed. She is not diaphoretic.  HENT:     Head: Normocephalic and atraumatic.  Cardiovascular:     Rate and Rhythm: Normal rate and regular rhythm.  Pulmonary:     Effort: Pulmonary effort is normal.     Breath sounds: Normal breath sounds. No decreased breath sounds.  Chest:     Chest wall: No tenderness.  Musculoskeletal:     Right lower leg: No tenderness. No edema.     Left lower leg: No tenderness. No edema.  Skin:    General: Skin is warm and dry.     Findings: No erythema or rash.  Neurological:     Mental Status: She is alert and oriented to person, place, and time.  Psychiatric:        Behavior: Behavior normal.     ED Results / Procedures / Treatments   Labs (all labs ordered are listed, but only abnormal results are displayed) Labs Reviewed  BASIC METABOLIC PANEL - Abnormal; Notable for the following components:      Result Value   Sodium 133 (*)    Glucose, Bld 104 (*)    Creatinine, Ser 1.04 (*)    Calcium 8.6 (*)    GFR, Estimated 60 (*)  All other components within normal limits  CBC - Abnormal; Notable for the following components:   WBC 3.2 (*)    RBC 3.75 (*)    Hemoglobin 10.3 (*)    HCT 33.8 (*)    RDW 16.1 (*)    Platelets 107 (*)    All other components within normal limits    EKG None  Radiology DG Chest 2 View  Result Date: 09/22/2022 CLINICAL DATA:  Shortness of breath. EXAM: CHEST - 2 VIEW COMPARISON:  07/29/2022. FINDINGS: The heart size and mediastinal contours are within normal limits. No consolidation, effusion, or pneumothorax. Degenerative changes are present in the thoracic spine. No acute osseous abnormality. Surgical clips are present in the right breast. IMPRESSION: No active cardiopulmonary disease. Electronically Signed   By: Thornell Sartorius M.D.   On: 09/22/2022 23:32    Procedures Procedures    Medications Ordered in ED Medications - No data to display   ED Course/ Medical Decision Making/ A&P                              Medical Decision Making Amount and/or Complexity of Data Reviewed Labs: ordered. Radiology: ordered.  Risk Prescription drug management.   This patient presents to the ED for concern of [facility], this involves an extensive number of treatment options, and is a complaint that carries with it a high risk of complications and morbidity.  The differential diagnosis includes but not limited to PNA, CHF, exposure, asthma   Co morbidities that complicate the patient evaluation  HTN, breast CA, DM, CHF   Additional history obtained:  External records from outside source obtained and reviewed including echo dated 07/31/22 with EF 55-60%   Lab Tests:  I Ordered, and personally interpreted labs.  The pertinent results include:  CBC and BMP without significant changes from prior labs on file   Imaging Studies ordered:  I ordered imaging studies including CXR  I independently visualized and interpreted imaging which showed no acute disease I agree with the radiologist interpretation   Cardiac Monitoring: / EKG:  The patient was maintained on a cardiac monitor.  I personally viewed and interpreted the cardiac monitored which showed an underlying rhythm of: sinus rhythm, rate 65   Problem List / ED Course / Critical interventions / Medication management  67 year old female with complaint of SHOB and vision changes, complaints ongoing for 1 + months without acute changes. Unable to identify what brought her and her daughter here with same symptoms, consider exposure but others in the room tonight are not ill. CXR clear- no evidence of overload or PNA, no lower ext edema. BP elevated. Labs without significant change from priors. Stable for dc to follow up with PCP. Able to speak in complete sentences, resp even and unlabored, no distress.  I have reviewed the patients home medicines and have made adjustments as needed   Social Determinants of Health:  Lives with  family   Test / Admission - Considered:  Stable for dc to follow up with PCP with return to ER precautions          Final Clinical Impression(s) / ED Diagnoses Final diagnoses:  Shortness of breath    Rx / DC Orders ED Discharge Orders     None         Alden Hipp 09/23/22 2956    Palumbo, April, MD 09/23/22 2130

## 2022-10-23 ENCOUNTER — Other Ambulatory Visit: Payer: Self-pay

## 2022-11-30 DIAGNOSIS — H25811 Combined forms of age-related cataract, right eye: Secondary | ICD-10-CM | POA: Diagnosis not present

## 2022-12-03 ENCOUNTER — Other Ambulatory Visit: Payer: Self-pay

## 2022-12-04 DIAGNOSIS — H25811 Combined forms of age-related cataract, right eye: Secondary | ICD-10-CM | POA: Diagnosis not present

## 2022-12-04 DIAGNOSIS — Z01818 Encounter for other preprocedural examination: Secondary | ICD-10-CM | POA: Diagnosis not present

## 2022-12-11 ENCOUNTER — Other Ambulatory Visit: Payer: Self-pay

## 2022-12-15 ENCOUNTER — Telehealth: Payer: Self-pay | Admitting: Nurse Practitioner

## 2022-12-15 NOTE — Telephone Encounter (Signed)
Form faxed back with provider instruction.

## 2022-12-15 NOTE — Telephone Encounter (Signed)
Copied from CRM [id number]. Topic: General - Other >> Dec 15, 2022  9:59 AM Turkey B wrote: Reason for CRM: Dionna from [facility]  , about clearance for surgery, coming up on Aug 2. She states this was fxed twice, okn 07/19 then, 07/25. She will refax today. Please cb, when received at,  [phone] ext [phone]

## 2022-12-21 ENCOUNTER — Other Ambulatory Visit (HOSPITAL_COMMUNITY)
Admission: RE | Admit: 2022-12-21 | Discharge: 2022-12-21 | Disposition: A | Discharge status: Home / Self Care | Payer: 59 | Source: Ambulatory Visit | Attending: Nurse Practitioner | Admitting: Nurse Practitioner

## 2022-12-21 ENCOUNTER — Ambulatory Visit: Payer: 59 | Attending: Nurse Practitioner | Admitting: Nurse Practitioner

## 2022-12-21 ENCOUNTER — Encounter: Payer: Self-pay | Admitting: Nurse Practitioner

## 2022-12-21 ENCOUNTER — Other Ambulatory Visit: Payer: Self-pay

## 2022-12-21 VITALS — BP 143/85 | HR 82 | Ht 66.0 in | Wt 183.4 lb

## 2022-12-21 DIAGNOSIS — Z01419 Encounter for gynecological examination (general) (routine) without abnormal findings: Secondary | ICD-10-CM | POA: Insufficient documentation

## 2022-12-21 DIAGNOSIS — Z124 Encounter for screening for malignant neoplasm of cervix: Secondary | ICD-10-CM | POA: Diagnosis not present

## 2022-12-21 DIAGNOSIS — G8929 Other chronic pain: Secondary | ICD-10-CM | POA: Diagnosis not present

## 2022-12-21 DIAGNOSIS — M25561 Pain in right knee: Secondary | ICD-10-CM

## 2022-12-21 DIAGNOSIS — E7800 Pure hypercholesterolemia, unspecified: Secondary | ICD-10-CM | POA: Diagnosis not present

## 2022-12-21 DIAGNOSIS — Z1211 Encounter for screening for malignant neoplasm of colon: Secondary | ICD-10-CM

## 2022-12-21 DIAGNOSIS — I509 Heart failure, unspecified: Secondary | ICD-10-CM

## 2022-12-21 DIAGNOSIS — Z1151 Encounter for screening for human papillomavirus (HPV): Secondary | ICD-10-CM | POA: Insufficient documentation

## 2022-12-21 DIAGNOSIS — D72819 Decreased white blood cell count, unspecified: Secondary | ICD-10-CM

## 2022-12-21 DIAGNOSIS — I10 Essential (primary) hypertension: Secondary | ICD-10-CM | POA: Diagnosis not present

## 2022-12-21 MED ORDER — DICLOFENAC SODIUM 1 % EX GEL: 4.0000 g | Freq: Four times a day (QID) | CUTANEOUS | 1 refills | Status: DC

## 2022-12-21 MED ORDER — SPIRONOLACTONE 25 MG PO TABS
25.0000 mg | ORAL_TABLET | Freq: Every day | ORAL | 1 refills | Status: DC
Start: 2022-12-21 — End: 2023-04-29
  Filled 2022-12-21 – 2023-04-05 (×2): qty 90, 90d supply, fill #0

## 2022-12-21 MED ORDER — CARVEDILOL 12.5 MG PO TABS
12.5000 mg | ORAL_TABLET | Freq: Two times a day (BID) | ORAL | 1 refills | Status: DC
Start: 2022-12-21 — End: 2023-04-29
  Filled 2022-12-21 – 2023-01-12 (×2): qty 180, 90d supply, fill #0
  Filled 2023-04-05: qty 180, 90d supply, fill #1

## 2022-12-21 MED ORDER — DICLOFENAC SODIUM 1 % EX GEL
4.0000 g | Freq: Four times a day (QID) | CUTANEOUS | 1 refills | Status: DC
Start: 2022-12-21 — End: 2023-03-05
  Filled 2022-12-21: qty 300, 19d supply, fill #0

## 2022-12-21 NOTE — Addendum Note (Signed)
Addended by: Arbie Cookey on: 12/21/2022 03:32 PM   Modules accepted: Orders

## 2022-12-21 NOTE — Patient Instructions (Addendum)
[facility] Located in: [facility] Address: [address] [place] [address], [place], [place] [address] Hours:  Open ? Closes 5:30?PM Phone: [phone]   [facility] [address] [place], [place] [address] [phone] Fax [phone]   Placed in [facility] [address] Ph# [phone]

## 2022-12-21 NOTE — Progress Notes (Signed)
Assessment & Plan:  Marie Church was seen today for gynecologic exam.  Diagnoses and all orders for this visit:  Encounter for Papanicolaou smear of cervix -     Cytology - PAP -     Cervicovaginal ancillary only  Chronic congestive heart failure, unspecified heart failure type (HCC) -     carvedilol (COREG) 12.5 MG tablet; Take 1 tablet (12.5 mg total) by mouth 2 (two) times daily with a meal. -     spironolactone (ALDACTONE) 25 MG tablet; Take 1 tablet (25 mg total) by mouth daily. -     Ambulatory referral to Cardiology  Primary hypertension -     carvedilol (COREG) 12.5 MG tablet; Take 1 tablet (12.5 mg total) by mouth 2 (two) times daily with a meal. -     CMP14+EGFR  Chronic pain of right knee -     Ambulatory referral to Orthopedics -     DG Knee Complete 4 Views Right; Future -     diclofenac Sodium (VOLTAREN) 1 % GEL; Apply 4 g topically 4 (four) times daily. For knee pain  Hypercholesterolemia -     Lipid panel  Leukopenia, unspecified type -     CBC with Differential  Colon cancer screening -     Ambulatory referral to Gastroenterology    Patient has been counseled on age-appropriate routine health concerns for screening and prevention. These are reviewed and up-to-date. Referrals have been placed accordingly. Immunizations are up-to-date or declined.    Subjective:   Chief Complaint  Patient presents with   Gynecologic Exam   Gynecologic Exam Associated symptoms include joint pain. Pertinent negatives include no abdominal pain, chills, fever, flank pain or rash.   Marie Church 67 y.o. female presents to office today for well woman exam.   She has a past medical history of Breast cancer with mets to axillary right lymph node (s/P neoadjuvant TCHP and right partial mastectomy 11-2020), DM type 2, CHF with reduced EF (CHEMO induced) Thrombocytopenia, and Hypertension.   She is overdue for follow up with cardiology. Was referred and they were unable to  contact her to schedule. She has been given the number today  She is overdue for mammogram and bone density and was given the phone number to the breast clinic to schedule.   She was also given the phone number for GI to schedule colonoscopy as well.   Knee Pain: Patient presents with knee pain involving the  right knee. Onset of the symptoms was several years ago. Inciting event: none known. Current symptoms include crepitus sensation, pain located in the patellar region, lateral and medial meniscus, stiffness, and swelling. Pain is aggravated by any weight bearing, going up and down stairs, inactivity, squatting, and walking.  Patient has had prior knee problems. Evaluation to date: Ortho consult: in [place] and she can not recall any treatment or recommendations . Treatment to date: avoidance of offending activity.   Review of Systems  Constitutional: Negative.  Negative for chills, fever, malaise/fatigue and weight loss.  Respiratory: Negative.  Negative for cough, shortness of breath and wheezing.   Cardiovascular: Negative.  Negative for chest pain, orthopnea and leg swelling.  Gastrointestinal:  Negative for abdominal pain.  Genitourinary: Negative.  Negative for flank pain.  Musculoskeletal:  Positive for joint pain.  Skin: Negative.  Negative for rash.  Psychiatric/Behavioral:  Negative for suicidal ideas.     Past Medical History:  Diagnosis Date   Breast cancer (HCC)    DM type  2 (diabetes mellitus, type 2) (HCC)    reportedly steroid induced   Hypertension     Past Surgical History:  Procedure Laterality Date   BREAST LUMPECTOMY Right 11/30/2020   BRONCHIAL BIOPSY  04/09/2021   Procedure: BRONCHIAL BIOPSIES;  Surgeon: Omar Person, MD;  Location: [facility];  Service: Pulmonary;;   BRONCHIAL NEEDLE ASPIRATION BIOPSY  04/09/2021   Procedure: BRONCHIAL NEEDLE ASPIRATION BIOPSIES;  Surgeon: Omar Person, MD;  Location: [facility];  Service: Pulmonary;;    BRONCHIAL WASHINGS  04/09/2021   Procedure: BRONCHIAL WASHINGS;  Surgeon: Omar Person, MD;  Location: [facility];  Service: Pulmonary;;   ENDOBRONCHIAL ULTRASOUND N/A 04/09/2021   Procedure: ENDOBRONCHIAL ULTRASOUND;  Surgeon: Omar Person, MD;  Location: [facility];  Service: Pulmonary;  Laterality: N/A;   HEMOSTASIS CONTROL  04/09/2021   Procedure: HEMOSTASIS CONTROL;  Surgeon: Omar Person, MD;  Location: [facility];  Service: Pulmonary;;   IR REMOVAL TUN ACCESS W/ PORT W/O FL MOD SED  07/30/2022   RIGHT/LEFT HEART CATH AND CORONARY ANGIOGRAPHY N/A 10/10/2020   Procedure: RIGHT/LEFT HEART CATH AND CORONARY ANGIOGRAPHY;  Surgeon: Dolores Patty, MD;  Location: [facility];  Service: Cardiovascular;  Laterality: N/A;   VIDEO BRONCHOSCOPY N/A 04/09/2021   Procedure: VIDEO BRONCHOSCOPY WITH FLUORO;  Surgeon: Omar Person, MD;  Location: [facility];  Service: Pulmonary;  Laterality: N/A;    Family History  Problem Relation Age of Onset   Heart failure Neg Hx    Heart disease Neg Hx    Heart attack Neg Hx     Social History Reviewed with no changes to be made today.   Outpatient Medications Prior to Visit  Medication Sig Dispense Refill   FARXIGA 10 MG TABS tablet Take 1 tablet (10 mg total) by mouth daily. 90 tablet 1   sacubitril-valsartan (ENTRESTO) 24-26 MG Take 1 tablet by mouth 2 (two) times daily. 180 tablet 1   carvedilol (COREG) 12.5 MG tablet Take 1 tablet (12.5 mg total) by mouth 2 (two) times daily with a meal. 180 tablet 1   diclofenac Sodium (VOLTAREN) 1 % GEL Apply 4 g topically 4 (four) times daily. 150 g 0   spironolactone (ALDACTONE) 25 MG tablet Take 1 tablet (25 mg total) by mouth daily. 90 tablet 1   No facility-administered medications prior to visit.    Allergies  Allergen Reactions   Doxycycline Rash   Amoxicillin-Pot Clavulanate Rash    Tolerates Rocephin   Lisinopril Rash       Objective:    BP (!) 143/85  (BP Location: Left Arm, Patient Position: Sitting, Cuff Size: Large)   Pulse 82   Ht 5\' 6"  (1.676 m)   Wt 183 lb 6.4 oz (83.2 kg)   SpO2 98%   BMI 29.60 kg/m  Wt Readings from Last 3 Encounters:  12/21/22 183 lb 6.4 oz (83.2 kg)  09/22/22 178 lb (80.7 kg)  08/20/22 178 lb (80.7 kg)    Physical Exam Exam conducted with a chaperone present.  Constitutional:      Appearance: She is well-developed.  HENT:     Head: Normocephalic.  Cardiovascular:     Rate and Rhythm: Normal rate and regular rhythm.     Heart sounds: Normal heart sounds.  Pulmonary:     Effort: Pulmonary effort is normal.     Breath sounds: Normal breath sounds.  Abdominal:     General: Bowel sounds are normal.     Palpations: Abdomen  is soft.     Hernia: There is no hernia in the left inguinal area.  Genitourinary:    Exam position: Lithotomy position.     Labia:        Right: No rash, tenderness, lesion or injury.        Left: No rash, tenderness, lesion or injury.      Vagina: Normal. No signs of injury and foreign body. No vaginal discharge, erythema, tenderness or bleeding.     Cervix: Normal.     Uterus: Not deviated and not enlarged.      Adnexa:        Right: No mass, tenderness or fullness.         Left: No mass, tenderness or fullness.       Rectum: Normal. No external hemorrhoid.  Lymphadenopathy:     Lower Body: No right inguinal adenopathy. No left inguinal adenopathy.  Skin:    General: Skin is warm and dry.  Neurological:     Mental Status: She is alert and oriented to person, place, and time.  Psychiatric:        Behavior: Behavior normal.        Thought Content: Thought content normal.        Judgment: Judgment normal.          Patient has been counseled extensively about nutrition and exercise as well as the importance of adherence with medications and regular follow-up. The patient was given clear instructions to go to ER or return to medical center if symptoms don't improve,  worsen or new problems develop. The patient verbalized understanding.   Follow-up: Return in about 3 months (around 03/23/2023).   Claiborne Rigg, FNP-BC [facility] [place], [place] [phone]   12/21/2022, 2:40 PM

## 2022-12-22 ENCOUNTER — Other Ambulatory Visit: Payer: Self-pay

## 2022-12-22 ENCOUNTER — Other Ambulatory Visit: Payer: Self-pay | Admitting: Nurse Practitioner

## 2022-12-22 DIAGNOSIS — K219 Gastro-esophageal reflux disease without esophagitis: Secondary | ICD-10-CM

## 2022-12-22 MED ORDER — FAMOTIDINE 40 MG PO TABS
40.0000 mg | ORAL_TABLET | Freq: Every day | ORAL | 3 refills | Status: DC
  Filled 2022-12-22 – 2023-01-12 (×2): qty 90, 90d supply, fill #0
  Filled 2023-04-05: qty 90, 90d supply, fill #1

## 2022-12-23 ENCOUNTER — Other Ambulatory Visit: Payer: Self-pay | Admitting: Nurse Practitioner

## 2022-12-25 ENCOUNTER — Other Ambulatory Visit: Payer: Self-pay

## 2022-12-28 ENCOUNTER — Other Ambulatory Visit: Payer: Self-pay

## 2022-12-28 ENCOUNTER — Encounter: Payer: Self-pay | Admitting: Orthopedic Surgery

## 2022-12-28 ENCOUNTER — Ambulatory Visit
Admission: RE | Admit: 2022-12-28 | Discharge: 2022-12-28 | Disposition: A | Discharge status: Home / Self Care | Payer: 59 | Source: Ambulatory Visit | Attending: Nurse Practitioner | Admitting: Nurse Practitioner

## 2022-12-28 ENCOUNTER — Ambulatory Visit (INDEPENDENT_AMBULATORY_CARE_PROVIDER_SITE_OTHER): Payer: 59 | Admitting: Orthopedic Surgery

## 2022-12-28 DIAGNOSIS — M1711 Unilateral primary osteoarthritis, right knee: Secondary | ICD-10-CM | POA: Diagnosis not present

## 2022-12-28 DIAGNOSIS — G8929 Other chronic pain: Secondary | ICD-10-CM | POA: Diagnosis not present

## 2022-12-28 DIAGNOSIS — M170 Bilateral primary osteoarthritis of knee: Secondary | ICD-10-CM | POA: Diagnosis not present

## 2022-12-28 DIAGNOSIS — M25561 Pain in right knee: Secondary | ICD-10-CM | POA: Diagnosis not present

## 2022-12-28 MED ORDER — TRAMADOL HCL 50 MG PO TABS
50.0000 mg | ORAL_TABLET | Freq: Four times a day (QID) | ORAL | 0 refills | Status: DC | PRN
  Filled 2022-12-28: qty 28, 7d supply, fill #0

## 2022-12-28 NOTE — Progress Notes (Unsigned)
Office Visit Note   Patient: Marie Church           Date of Birth: [birth year]           MRN: [id number] Visit Date: 12/28/2022 Requested by: Claiborne Rigg, NP [address] [place],  [place] [address] PCP: Claiborne Rigg, NP  Subjective: Chief Complaint  Patient presents with   Right Knee - Pain    HPI: Marie Church is a 67 y.o. female who presents to the office reporting bilateral knee pain right worse than left.  Pain been going on for 10 years.  Denies any history of injury.  Reports worsening pain.  Difficulty with ambulation.  Hard for her to bend the knee with ambulation.  She walks with her leg straight.  She does report swelling as well as severe pain.  Pain with every step.  Takes Tylenol and ibuprofen as needed.  No prior surgery on the knees.  Knee pain does keep her from going to sleep.  She reports decreased walking endurance.  Currently finished treatment chemo and x-ray therapy for breast cancer..                ROS: All systems reviewed are negative as they relate to the chief complaint within the history of present illness.  Patient denies fevers or chills.  Assessment & Plan: Visit Diagnoses:  1. Osteoarthritis of both knees, unspecified osteoarthritis type     Plan: Impression is severe right knee arthritis with less severe left knee arthritis.  I think she is really a candidate for total knee replacement when she is ready.  She is more interested in pain management.  One-time prescription for Ultram written.  Should not be refilled.  Refer to pain management.  8-week return for clinical recheck and possible injection in the left knee.  Follow-Up Instructions: No follow-ups on file.   Orders:  Orders Placed This Encounter  Procedures   Ambulatory referral to Pain Clinic   Meds ordered this encounter  Medications   traMADol (ULTRAM) 50 MG tablet    Sig: Take 1 tablet (50 mg total) by mouth every 6 (six) hours as needed.     Dispense:  30 tablet    Refill:  0      Procedures: Large Joint Inj: R knee on 12/28/2022 7:32 PM Indications: diagnostic evaluation, joint swelling and pain Details: 18 G 1.5 in needle, superolateral approach  Arthrogram: No  Medications: 5 mL lidocaine 1 %; 40 mg methylPREDNISolone acetate 40 MG/ML; 4 mL bupivacaine 0.25 % Outcome: tolerated well, no immediate complications Procedure, treatment alternatives, risks and benefits explained, specific risks discussed. Consent was given by the patient. Immediately prior to procedure a time out was called to verify the correct patient, procedure, equipment, support staff and site/side marked as required. Patient was prepped and draped in the usual sterile fashion.       Clinical Data: No additional findings.  Objective: Vital Signs: There were no vitals taken for this visit.  Physical Exam:  Constitutional: Patient appears well-developed HEENT:  Head: Normocephalic Eyes:EOM are normal Neck: Normal range of motion Cardiovascular: Normal rate Pulmonary/chest: Effort normal Neurologic: Patient is alert Skin: Skin is warm Psychiatric: Patient has normal mood and affect  Ortho Exam: Ortho exam demonstrates full extension and flexion to about 105 to 110 degrees bilaterally.  Varus alignment is moderate to severe on the right and mild to moderate on the left.  Pedal pulses palpable.  Ankle  dorsiflexion intact.  No effusion in either knee.  Patellofemoral crepitus is present bilaterally.  No point pain with internal or external rotation of either leg.  Specialty Comments:  No specialty comments available.  Imaging: No results found.   PMFS History: Patient Active Problem List   Diagnosis Date Noted   Hypokalemia 08/02/2022   Bacteremia 07/31/2022   Streptococcal bacteremia 07/30/2022   Port or reservoir infection 07/30/2022   Fever 07/29/2022   Toothache 06/07/2022   Musculoskeletal pain 06/07/2022   Right shoulder pain  06/07/2022   Malnutrition of moderate degree 04/07/2021   HFrEF (heart failure with reduced ejection fraction) (HCC) 03/30/2021   Hyponatremia 03/30/2021   Thrombocytopenia (HCC) 03/30/2021   Breast cancer metastasized to axillary lymph node, right (HCC) 11/30/2020   Severe protein-calorie malnutrition (HCC) 08/16/2020   Anemia 07/31/2020   GERD without esophagitis 07/31/2020   Breast cancer (HCC) 04/03/2020   Malignant neoplasm of upper-outer quadrant of right breast in female, estrogen receptor negative (HCC) 03/18/2020   Mediastinal lymphadenopathy 03/12/2020   Macrocytic anemia 02/29/2020   Physical debility 01/30/2020   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   Difficulty in walking 03/25/2018   Osteoarthritis of knee 03/25/2018   Arthritis 01/28/2018   Essential hypertension 01/28/2018   Bilateral knee pain 12/22/2011   Dizziness 12/22/2011   Sinusitis 12/22/2011   Sore throat 12/22/2011   Past Medical History:  Diagnosis Date   Breast cancer (HCC)    DM type 2 (diabetes mellitus, type 2) (HCC)    reportedly steroid induced   Hypertension     Family History  Problem Relation Age of Onset   Heart failure Neg Hx    Heart disease Neg Hx    Heart attack Neg Hx     Past Surgical History:  Procedure Laterality Date   BREAST LUMPECTOMY Right 11/30/2020   BRONCHIAL BIOPSY  04/09/2021   Procedure: BRONCHIAL BIOPSIES;  Surgeon: Omar Person, MD;  Location: [facility];  Service: Pulmonary;;   BRONCHIAL NEEDLE ASPIRATION BIOPSY  04/09/2021   Procedure: BRONCHIAL NEEDLE ASPIRATION BIOPSIES;  Surgeon: Omar Person, MD;  Location: [facility];  Service: Pulmonary;;   BRONCHIAL WASHINGS  04/09/2021   Procedure: BRONCHIAL WASHINGS;  Surgeon: Omar Person, MD;  Location: [facility];  Service: Pulmonary;;   ENDOBRONCHIAL ULTRASOUND N/A 04/09/2021   Procedure: ENDOBRONCHIAL ULTRASOUND;  Surgeon: Omar Person, MD;  Location: [facility];  Service: Pulmonary;   Laterality: N/A;   HEMOSTASIS CONTROL  04/09/2021   Procedure: HEMOSTASIS CONTROL;  Surgeon: Omar Person, MD;  Location: [facility];  Service: Pulmonary;;   IR REMOVAL TUN ACCESS W/ PORT W/O FL MOD SED  07/30/2022   RIGHT/LEFT HEART CATH AND CORONARY ANGIOGRAPHY N/A 10/10/2020   Procedure: RIGHT/LEFT HEART CATH AND CORONARY ANGIOGRAPHY;  Surgeon: Dolores Patty, MD;  Location: [facility];  Service: Cardiovascular;  Laterality: N/A;   VIDEO BRONCHOSCOPY N/A 04/09/2021   Procedure: VIDEO BRONCHOSCOPY WITH FLUORO;  Surgeon: Omar Person, MD;  Location: [facility];  Service: Pulmonary;  Laterality: N/A;   Social History   Occupational History   Not on file  Tobacco Use   Smoking status: Former    Current packs/day: 0.00    Types: Cigarettes    Start date: 76    Quit date: 2000    Years since quitting: 24.6   Smokeless tobacco: Never  Vaping Use   Vaping status: Never Used  Substance and Sexual Activity   Alcohol use: Never   Drug use:  Never   Sexual activity: Not on file

## 2022-12-29 ENCOUNTER — Other Ambulatory Visit: Payer: Self-pay

## 2022-12-29 MED ORDER — LIDOCAINE HCL 1 % IJ SOLN
5.0000 mL | INTRAMUSCULAR | Status: AC | PRN
Start: 2022-12-28 — End: 2022-12-28
  Administered 2022-12-28: 5 mL

## 2022-12-29 MED ORDER — BUPIVACAINE HCL 0.25 % IJ SOLN
4.0000 mL | INTRAMUSCULAR | Status: AC | PRN
Start: 2022-12-28 — End: 2022-12-28
  Administered 2022-12-28: 4 mL via INTRA_ARTICULAR

## 2022-12-29 MED ORDER — METHYLPREDNISOLONE ACETATE 40 MG/ML IJ SUSP
40.0000 mg | INTRAMUSCULAR | Status: AC | PRN
  Administered 2022-12-28: 40 mg via INTRA_ARTICULAR

## 2022-12-30 ENCOUNTER — Other Ambulatory Visit: Payer: Self-pay

## 2023-01-12 ENCOUNTER — Other Ambulatory Visit: Payer: Self-pay

## 2023-01-20 ENCOUNTER — Telehealth: Payer: Self-pay | Admitting: Orthopedic Surgery

## 2023-01-20 NOTE — Telephone Encounter (Signed)
Pt would like to know what  pain management clinic she is being sent too she is almost out of pain medication also please advise

## 2023-01-22 ENCOUNTER — Telehealth: Payer: Self-pay | Admitting: Orthopedic Surgery

## 2023-01-22 NOTE — Telephone Encounter (Signed)
Pt called needing a refill on pain medication. Would like to know when she can make an appt for pain management clinic. Pt also advised for someone to give her a call to schedule a shot in her R knee with Dr. August Saucer please. Carver Fila [phone]

## 2023-01-22 NOTE — Telephone Encounter (Signed)
We placed referral to pain management last visit? What's the status on that? Too soon for repeat injection, we can do that in November.  Dr August Saucer did not want to refill pain medication, that was one-time RX

## 2023-01-22 NOTE — Telephone Encounter (Signed)
Can you please advise on return the status of referral please

## 2023-01-26 ENCOUNTER — Telehealth: Payer: Self-pay | Admitting: Orthopedic Surgery

## 2023-01-26 NOTE — Telephone Encounter (Signed)
noited

## 2023-01-26 NOTE — Telephone Encounter (Signed)
Good morning I sent your referral to [facility]

## 2023-01-26 NOTE — Telephone Encounter (Signed)
Patient called needing Rx refilled Tramadol. The number to contact patient is [phone]

## 2023-01-26 NOTE — Telephone Encounter (Signed)
Please advise since pt will have a wait to get in with pain management

## 2023-01-27 ENCOUNTER — Ambulatory Visit: Payer: 59 | Attending: Physician Assistant | Admitting: Physician Assistant

## 2023-01-27 ENCOUNTER — Encounter: Payer: Self-pay | Admitting: Physician Assistant

## 2023-01-27 VITALS — BP 132/80 | HR 105 | Ht 66.0 in | Wt 188.2 lb

## 2023-01-27 DIAGNOSIS — R0602 Shortness of breath: Secondary | ICD-10-CM

## 2023-01-27 DIAGNOSIS — C773 Secondary and unspecified malignant neoplasm of axilla and upper limb lymph nodes: Secondary | ICD-10-CM | POA: Diagnosis not present

## 2023-01-27 DIAGNOSIS — I5020 Unspecified systolic (congestive) heart failure: Secondary | ICD-10-CM

## 2023-01-27 DIAGNOSIS — C50911 Malignant neoplasm of unspecified site of right female breast: Secondary | ICD-10-CM

## 2023-01-27 DIAGNOSIS — I10 Essential (primary) hypertension: Secondary | ICD-10-CM | POA: Diagnosis not present

## 2023-01-27 DIAGNOSIS — Z01810 Encounter for preprocedural cardiovascular examination: Secondary | ICD-10-CM

## 2023-01-27 NOTE — Progress Notes (Signed)
Cardiology Office Note:  .   Date:  01/27/2023  ID:  Marie Church, DOB 11-30-1955, MRN [id number] PCP: Claiborne Rigg, NP  [facility] Providers Cardiologist:  Thurmon Fair, MD     History of Present Illness: .   Marie Church is a 67 y.o. female with a hx of hypertension, DM2, and metastatic breast cancer on neoadjuvant chemotherapy.  Previous nuclear stress test in 2010 showed no evidence of ischemia.  She was diagnosed with breast cancer in October 2021 and found to have metastasis to lymph nodes.  Chemotherapy +/- radiation therapy and likely surgery once tumor size improved was recommended.  Echocardiogram obtained in March 2022 showed EF 62%, normal LV diastolic function, normal RV function, mild MR, mild to moderate TR, moderate pulmonary hypertension.     Patient was admitted in the hospital in May 2022 due to worsening shortness of breath and lower extremity edema.  Her systolic blood pressure on arrival in the ED was over 200.  Troponin was mildly elevated.  A CTA of the chest showed no PE but with evidence of parenchymal scarring and atelectasis.  Echocardiogram showed EF 25 to 30%, moderate LVH, global hypokinesis, mildly reduced RV systolic function, mild to moderate MR.  Patient was treated with IV Lasix for acute combined systolic and diastolic heart failure.  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  She was placed on carvedilol, Entresto with recommendation to start on Farxiga as outpatient.  She was discharged on Lasix 40 mg daily.  The recommendation was to consider medical therapy for 3 months and if EF remains low, may consider ischemic workup as outpatient.  Unfortunately, patient bounced back to the hospital 1 day after discharge with shortness of breath.  Left and right heart cath showed cardiac output 8.2, cardiac index 4.2, wedge pressure 12, normal coronary arteries, EF 25 to 30%.  Marie Church was added to her medical regiment.   She was discharged on 40 mg daily of Lasix.  I last saw the patient in July 2022, she was doing well at the time, I recommend outpatient echocardiogram prior to the next visit.  Repeat echocardiogram in October 2022 showed EF improved to 50%.  Patient was readmitted in March 2024 with history of fever and chill.  UA was negative for UTI.  Temperature on arrival was 103.2.  Patient was tachycardic and hypertensive on arrival.  CT of the chest abdomen pelvis was negative for any source of fever.  Procalcitonin was equivocal.  She was treated with broad-spectrum antibiotic.  Blood culture showed gram-positive cocci and later grew strep pyogenes.  Her port was removed.  TTE was negative for endocarditis or valvular vegetation, it does show the EF was 55 to 60%, mild LVH.  Patient was back in the hospital in May 2024 with shortness of breath and dizziness.  Chest x-ray was negative for acute process.  Creatinine mildly elevated at 1.04, baseline 0.7.  CBC showed white blood cell count borderline low at 3.2, hemoglobin 10.3.  Patient presents today for follow-up.  She has been having some worsening shortness of breath for the past 2 weeks.  She also complained of some chills as well.  Lung exam is clear.  Heart rate is mildly tachycardic, however she was in a rush to come to the office today.  She is maintaining sinus rhythm.  I recommend the secondary workup included basic metabolic panel, CBC, BNP, D-dimer.  If negative, will consider a repeat limited echocardiogram on  the next follow-up in 6 weeks.  At this time, she appears to be euvolemic on exam.  I do not think she is in acute heart failure.  ROS:   Patient complains of shortness of breath for the past 2 weeks but no chest pain, orthopnea or PND.  Studies Reviewed: .        Cardiac Studies & Procedures   CARDIAC CATHETERIZATION  CARDIAC CATHETERIZATION 10/10/2020  Narrative Findings:  Ao = 110/67 (87) LV = 115/19 RA = 5 RV = 43/8 PA = 43/18  (27) PCW = 12 Fick cardiac output/index = 8.2/4.2 PVR = 1.8 WU FA sat = 99% PA sat = 73%, 75%  Assessment: 1. Normal cors 2. 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 Relatively well compensated hemodynamics with high cardiac output  Plan/Discussion:  Stop IV lasix. Switch to po. Continue to titrate GDMT. Likely can d/c soon.  Marie Meres, MD 9:27 AM  Findings Coronary Findings Diagnostic  Dominance: Right  Left Main Vessel is angiographically normal.  Left Anterior Descending Vessel is angiographically normal.  Left Circumflex Vessel is angiographically normal.  Right Coronary Artery Vessel is angiographically normal.  Intervention  No interventions have been documented.     ECHOCARDIOGRAM  ECHOCARDIOGRAM COMPLETE 07/31/2022  Narrative ECHOCARDIOGRAM REPORT    Patient Name:   Marie Church Date of Exam: 07/31/2022 Medical Rec #:  [id number]          Height:       66.0 in Accession #:    [id number]         Weight:       174.2 lb Date of Birth:  02/16/1956          BSA:          1.886 m Patient Age:    66 years           BP:           122/72 mmHg Patient Gender: F                  HR:           74 bpm. Exam Location:  Inpatient  Procedure: 2D Echo, Cardiac Doppler and Color Doppler  Indications:    bacteremia  History:        Patient has prior history of Echocardiogram examinations. Risk Factors:Diabetes and Hypertension.  Sonographer:    Mike Gip Referring Phys: [phone] Marie Church  IMPRESSIONS   1. Left ventricular ejection fraction, by estimation, is 55 to 60%. The left ventricle has normal function. The left ventricle has no regional wall motion abnormalities. There is mild concentric left ventricular hypertrophy. Left ventricular diastolic parameters were normal. 2. Right ventricular systolic function is normal. The right ventricular size is normal. Tricuspid regurgitation signal is inadequate for assessing PA pressure. 3. The mitral valve is normal  in structure. Trivial mitral valve regurgitation. No evidence of mitral stenosis. 4. The aortic valve is tricuspid. Aortic valve regurgitation is not visualized. No aortic stenosis is present. 5. The inferior vena cava is dilated in size with >50% respiratory variability, suggesting right atrial pressure of 8 mmHg.  Comparison(s): Prior images reviewed side by side. Changes from prior study are noted. EF improved compared to prior.  Conclusion(s)/Recommendation(s): No evidence of valvular vegetations on this transthoracic echocardiogram. Consider a transesophageal echocardiogram to exclude infective endocarditis if clinically indicated.  FINDINGS Left Ventricle: Left ventricular ejection fraction, by estimation, is 55 to 60%. The left ventricle has normal function. The left ventricle  has no regional wall motion abnormalities. The left ventricular internal cavity size was normal in size. There is mild concentric left ventricular hypertrophy. Left ventricular diastolic parameters were normal.  Right Ventricle: The right ventricular size is normal. No increase in right ventricular wall thickness. Right ventricular systolic function is normal. Tricuspid regurgitation signal is inadequate for assessing PA pressure.  Left Atrium: Left atrial size was normal in size.  Right Atrium: Right atrial size was normal in size.  Pericardium: There is no evidence of pericardial effusion.  Mitral Valve: The mitral valve is normal in structure. Trivial mitral valve regurgitation. No evidence of mitral valve stenosis.  Tricuspid Valve: The tricuspid valve is normal in structure. Tricuspid valve regurgitation is trivial. No evidence of tricuspid stenosis.  Aortic Valve: The aortic valve is tricuspid. Aortic valve regurgitation is not visualized. No aortic stenosis is present.  Pulmonic Valve: The pulmonic valve was not well visualized. Pulmonic valve regurgitation is not visualized. No evidence of pulmonic  stenosis.  Aorta: The aortic root, ascending aorta, aortic arch and descending aorta are all structurally normal, with no evidence of dilitation or obstruction.  Venous: The inferior vena cava is dilated in size with greater than 50% respiratory variability, suggesting right atrial pressure of 8 mmHg.  IAS/Shunts: The atrial septum is grossly normal.   LEFT VENTRICLE PLAX 2D LVIDd:         4.00 cm     Diastology LVIDs:         2.70 cm     LV e' medial:    7.72 cm/s LV PW:         1.20 cm     LV E/e' medial:  11.4 LV IVS:        1.20 cm     LV e' lateral:   12.60 cm/s LVOT diam:     1.80 cm     LV E/e' lateral: 7.0 LV SV:         64 LV SV Index:   34 LVOT Area:     2.54 cm  LV Volumes (MOD) LV vol d, MOD A2C: 96.9 ml LV vol d, MOD A4C: 84.9 ml LV vol s, MOD A2C: 31.3 ml LV vol s, MOD A4C: 33.0 ml LV SV MOD A2C:     65.6 ml LV SV MOD A4C:     84.9 ml LV SV MOD BP:      59.9 ml  RIGHT VENTRICLE             IVC RV Basal diam:  3.83 cm     IVC diam: 2.10 cm RV S prime:     12.70 cm/s TAPSE (M-mode): 2.2 cm  LEFT ATRIUM             Index        RIGHT ATRIUM           Index LA diam:        3.60 cm 1.91 cm/m   RA Area:     15.10 cm LA Vol (A2C):   56.3 ml 29.86 ml/m  RA Volume:   39.20 ml  20.79 ml/m LA Vol (A4C):   37.8 ml 20.05 ml/m LA Biplane Vol: 46.4 ml 24.61 ml/m AORTIC VALVE LVOT Vmax:   116.00 cm/s LVOT Vmean:  83.100 cm/s LVOT VTI:    0.250 m  AORTA Ao Root diam: 2.90 cm Ao Asc diam:  2.70 cm  MITRAL VALVE MV Area (PHT): 4.39 cm    SHUNTS MV Decel  Time: 173 msec    Systemic VTI:  0.25 m MV E velocity: 88.30 cm/s  Systemic Diam: 1.80 cm MV A velocity: 58.70 cm/s MV E/A ratio:  1.50  Jodelle Red MD Electronically signed by Jodelle Red MD Signature Date/Time: 07/31/2022/4:38:43 PM    Final             Risk Assessment/Calculations:             Physical Exam:   VS:  BP 132/80   Pulse (!) 105   Ht 5\' 6"  (1.676 m)   Wt 188  lb 3.2 oz (85.4 kg)   SpO2 97%   BMI 30.38 kg/m    Wt Readings from Last 3 Encounters:  01/27/23 188 lb 3.2 oz (85.4 kg)  12/21/22 183 lb 6.4 oz (83.2 kg)  09/22/22 178 lb (80.7 kg)    GEN: Well nourished, well developed in no acute distress NECK: No JVD; No carotid bruits CARDIAC: RRR, no murmurs, rubs, gallops RESPIRATORY:  Clear to auscultation without rales, wheezing or rhonchi  ABDOMEN: Soft, non-tender, non-distended EXTREMITIES:  No edema; No deformity   ASSESSMENT AND PLAN: .    Shortness of breath: Recent echocardiogram showed normal EF.  Obtain blood work to rule out secondary causes.  History of LV dysfunction: With improved EF.  Preoperative clearance: Patient may proceed with cataract surgery without further workup  Hypertension: Blood pressure stable  Breast cancer: Followed by oncology service       Dispo: Follow-up in 6 weeks  Signed, Azalee Course, PA

## 2023-01-27 NOTE — Patient Instructions (Signed)
Medication Instructions:  NO CHANGES *If you need a refill on your cardiac medications before your next appointment, please call your pharmacy*   Lab Work: BMP,CBC,BNP, D-DIMER, COVID/FLU-TODAY If you have labs (blood work) drawn today and your tests are completely normal, you will receive your results only by: MyChart Message (if you have MyChart) OR A paper copy in the mail If you have any lab test that is abnormal or we need to change your treatment, we will call you to review the results.   Testing/Procedures: NO TESTING   Follow-Up: At [facility], you and your health needs are our priority.  As part of our continuing mission to provide you with exceptional heart care, we have created designated Provider Care Teams.  These Care Teams include your primary Cardiologist (physician) and Advanced Practice Providers (APPs -  Physician Assistants and Nurse Practitioners) who all work together to provide you with the care you need, when you need it.  We recommend signing up for the patient portal called "MyChart".  Sign up information is provided on this After Visit Summary.  MyChart is used to connect with patients for Virtual Visits (Telemedicine).  Patients are able to view lab/test results, encounter notes, upcoming appointments, etc.  Non-urgent messages can be sent to your provider as well.   To learn more about what you can do with MyChart, go to [email or web address].    Your next appointment:   6 week(s)  Provider:   Azalee Course, PA  Other Instructions CLEARED FOR CATARACT SURGERY.

## 2023-01-28 ENCOUNTER — Other Ambulatory Visit: Payer: Self-pay | Admitting: Surgical

## 2023-01-28 ENCOUNTER — Other Ambulatory Visit: Payer: Self-pay

## 2023-01-28 LAB — BASIC METABOLIC PANEL
BUN/Creatinine Ratio: 24 (ref 12–28)
BUN: 18 mg/dL (ref 8–27)
CO2: 25 mmol/L (ref 20–29)
Calcium: 9.1 mg/dL (ref 8.7–10.3)
Chloride: 103 mmol/L (ref 96–106)
Creatinine, Ser: 0.75 mg/dL (ref 0.57–1.00)
Glucose: 122 mg/dL — ABNORMAL HIGH (ref 70–99)
Potassium: 3.9 mmol/L (ref 3.5–5.2)
Sodium: 140 mmol/L (ref 134–144)
eGFR: 88 mL/min/{1.73_m2} (ref >59)

## 2023-01-28 LAB — CBC
Hematocrit: 32.9 % — ABNORMAL LOW (ref 34.0–46.6)
Hemoglobin: 10.4 g/dL — ABNORMAL LOW (ref 11.1–15.9)
MCH: 29.1 pg (ref 26.6–33.0)
MCHC: 31.6 g/dL (ref 31.5–35.7)
MCV: 92 fL (ref 79–97)
Platelets: 175 10*3/uL (ref 150–450)
RBC: 3.58 x10E6/uL — ABNORMAL LOW (ref 3.77–5.28)
RDW: 16 % — ABNORMAL HIGH (ref 11.7–15.4)
WBC: 4.2 10*3/uL (ref 3.4–10.8)

## 2023-01-28 LAB — D-DIMER, QUANTITATIVE: D-DIMER: 0.4 mg{FEU}/L (ref 0.00–0.49)

## 2023-01-28 LAB — PRO B NATRIURETIC PEPTIDE: NT-Pro BNP: <36 pg/mL (ref 0–301)

## 2023-01-28 MED ORDER — TRAMADOL HCL 50 MG PO TABS
50.0000 mg | ORAL_TABLET | Freq: Two times a day (BID) | ORAL | 0 refills | Status: DC | PRN
  Filled 2023-01-28: qty 30, 15d supply, fill #0

## 2023-01-28 NOTE — Telephone Encounter (Signed)
Sent in refill for tramadol but this will be the only prescription until she gets in with pain mgmt

## 2023-01-29 ENCOUNTER — Other Ambulatory Visit: Payer: Self-pay

## 2023-01-29 NOTE — Telephone Encounter (Signed)
Tried calling pt to advise. No answer and mailbox was full

## 2023-02-03 ENCOUNTER — Telehealth: Payer: Self-pay

## 2023-02-03 NOTE — Telephone Encounter (Addendum)
Tried to call patient and daughter Renae Fickle) regarding results numbers seem to not be working also called son Howie Ill) and didn't get an answer, left message to have mom call office back.  ----- Message from Azalee Course sent at 01/29/2023  8:24 AM EDT ----- Normal red blood cell count. Normal renal function and electrolyte. Normal d-dimer. ProBNP normal therefore not suggestive of volume overload.

## 2023-02-04 NOTE — Telephone Encounter (Signed)
Patient's daughter is returning call.

## 2023-02-04 NOTE — Telephone Encounter (Signed)
Spoke with patient and she is aware of lab results.  Verbalized understanding.

## 2023-02-09 ENCOUNTER — Encounter: Payer: Self-pay | Admitting: Nurse Practitioner

## 2023-02-18 ENCOUNTER — Encounter: Payer: Self-pay | Admitting: Physical Medicine & Rehabilitation

## 2023-03-05 ENCOUNTER — Encounter: Payer: Self-pay | Admitting: Physical Medicine & Rehabilitation

## 2023-03-05 ENCOUNTER — Encounter: Payer: 59 | Attending: Physical Medicine & Rehabilitation | Admitting: Physical Medicine & Rehabilitation

## 2023-03-05 VITALS — BP 164/100 | HR 95 | Ht 66.0 in | Wt 195.0 lb

## 2023-03-05 DIAGNOSIS — M170 Bilateral primary osteoarthritis of knee: Secondary | ICD-10-CM | POA: Insufficient documentation

## 2023-03-05 DIAGNOSIS — G894 Chronic pain syndrome: Secondary | ICD-10-CM | POA: Diagnosis not present

## 2023-03-05 DIAGNOSIS — Z79891 Long term (current) use of opiate analgesic: Secondary | ICD-10-CM | POA: Insufficient documentation

## 2023-03-05 DIAGNOSIS — Z5181 Encounter for therapeutic drug level monitoring: Secondary | ICD-10-CM | POA: Diagnosis not present

## 2023-03-05 NOTE — Progress Notes (Addendum)
Subjective:    Patient ID: Marie Church, female    DOB: Oct 06, 1955, 67 y.o.   MRN: [id number]  HPI   HPI  Marie Church is a 67 y.o. year old female  who  has a past medical history of Breast cancer (HCC), DM type 2 (diabetes mellitus, type 2) (HCC), and Hypertension.   They are presenting to PM&R clinic as a new patient for pain management evaluation. They were referred by Dr. August Saucer for treatment of b/l Knee OA  pain.  Patient has had pain in her knees bilaterally for over 10 years.  Pain is worse in her right knee than her left knee.  Denies any history of trauma to her knees or surgery to her knees.  Standing and walking worsens her pain.  She had a right knee injection, was on by Dr. August Saucer 12/28/2022 for severe knee OA.  Dr. August Saucer discussed TKA however patient not interested at this time.  She previously had chemotherapy and x-ray treatment for breast cancer.   Red flag symptoms: No red flags for back pain endorsed in Hx or ROS  Medications tried:  Topical medications Voltaren doesn't help her- made it hurt worse Nsaids Ibuprofen minimal benefit  Tylenol  Helps slightly  Opiates  Tramadol helped her move around a little better Gabapentin / Lyrica  - Denies  TCAs  - Denies  SNRIs  Duloxetine - denies  Other  - denies   Other treatments: PT- for her knees was helpful a few years ago  TENs unit - denies  Injections Cortisone knee injection  Surgery denies     Prior UDS results:     Component Value Date/Time   LABOPIA NONE DETECTED 06/06/2022 2322   COCAINSCRNUR NONE DETECTED 06/06/2022 2322   LABBENZ NONE DETECTED 06/06/2022 2322   AMPHETMU NONE DETECTED 06/06/2022 2322   THCU NONE DETECTED 06/06/2022 2322   LABBARB NONE DETECTED 06/06/2022 2322      Pain Inventory Average Pain 9 Pain Right Now 8 My pain is constant and sharp  In the last 24 hours, has pain interfered with the following? General activity 10 Relation with others 9 Enjoyment of life  9 What TIME of day is your pain at its worst? morning , daytime, evening, and night Sleep (in general) Fair  Pain is worse with: walking, bending, and standing Pain improves with: medication Relief from Meds: 2  ability to climb steps?  yes do you drive?  no  I need assistance with the following:  meal prep, household duties, and shopping  weakness trouble walking  Any changes since last visit?  no      Family History  Problem Relation Age of Onset   Heart failure Neg Hx    Heart disease Neg Hx    Heart attack Neg Hx    Social History   Socioeconomic History   Marital status: Widowed    Spouse name: Not on file   Number of children: Not on file   Years of education: Not on file   Highest education level: Not on file  Occupational History   Not on file  Tobacco Use   Smoking status: Former    Current packs/day: 0.00    Types: Cigarettes    Start date: 64    Quit date: 2000    Years since quitting: 24.8   Smokeless tobacco: Never  Vaping Use   Vaping status: Never Used  Substance and Sexual Activity   Alcohol use: Not Currently  Drug use: Never   Sexual activity: Not on file  Other Topics Concern   Not on file  Social History Narrative   Not on file   Social Determinants of Health   Financial Resource Strain: Low Risk  (02/27/2022)   [facility], [facility], [facility], [facility]   Overall Financial Resource Strain (CARDIA)    Difficulty of Paying Living Expenses: Not hard at all  Food Insecurity: No Food Insecurity (07/30/2022)   Hunger Vital Sign    Worried About Running Out of Food in the Last Year: Never true    Ran Out of Food in the Last Year: Never true  Transportation Needs: No Transportation Needs (07/30/2022)   PRAPARE - Administrator, Civil Service (Medical): No    Lack of Transportation (Non-Medical): No  Physical Activity: Not on file  Stress: Stress Concern Present  (02/04/2021)   [employer] of Occupational Health - Occupational Stress Questionnaire    Feeling of Stress : To some extent  Social Connections: Unknown (07/14/2022)   Received from [facility], [facility]   Social Network    Social Network: Not on file   Past Surgical History:  Procedure Laterality Date   BREAST LUMPECTOMY Right 11/30/2020   BRONCHIAL BIOPSY  04/09/2021   Procedure: BRONCHIAL BIOPSIES;  Surgeon: Omar Person, MD;  Location: [facility];  Service: Pulmonary;;   BRONCHIAL NEEDLE ASPIRATION BIOPSY  04/09/2021   Procedure: BRONCHIAL NEEDLE ASPIRATION BIOPSIES;  Surgeon: Omar Person, MD;  Location: [facility];  Service: Pulmonary;;   BRONCHIAL WASHINGS  04/09/2021   Procedure: BRONCHIAL WASHINGS;  Surgeon: Omar Person, MD;  Location: [facility];  Service: Pulmonary;;   ENDOBRONCHIAL ULTRASOUND N/A 04/09/2021   Procedure: ENDOBRONCHIAL ULTRASOUND;  Surgeon: Omar Person, MD;  Location: [facility];  Service: Pulmonary;  Laterality: N/A;   HEMOSTASIS CONTROL  04/09/2021   Procedure: HEMOSTASIS CONTROL;  Surgeon: Omar Person, MD;  Location: [facility];  Service: Pulmonary;;   IR REMOVAL TUN ACCESS W/ PORT W/O FL MOD SED  07/30/2022   RIGHT/LEFT HEART CATH AND CORONARY ANGIOGRAPHY N/A 10/10/2020   Procedure: RIGHT/LEFT HEART CATH AND CORONARY ANGIOGRAPHY;  Surgeon: Dolores Patty, MD;  Location: [facility];  Service: Cardiovascular;  Laterality: N/A;   VIDEO BRONCHOSCOPY N/A 04/09/2021   Procedure: VIDEO BRONCHOSCOPY WITH FLUORO;  Surgeon: Omar Person, MD;  Location: [facility];  Service: Pulmonary;  Laterality: N/A;   Past Medical History:  Diagnosis Date   Breast cancer (HCC)    DM type 2 (diabetes mellitus, type 2) (HCC)    reportedly steroid induced   Hypertension    BP (!) 172/115   Pulse 95   Ht 5\' 6"  (1.676 m)   Wt 195 lb (88.5 kg)   SpO2 96%   BMI 31.47 kg/m   Opioid Risk Score:   Fall  Risk Score:  `1  Depression screen PHQ 2/9     12/21/2022    2:16 PM 08/20/2022    2:47 PM 08/20/2022    2:46 PM  Depression screen PHQ 2/9  Decreased Interest 1 1 0  Down, Depressed, Hopeless 0 1 0  PHQ - 2 Score 1 2 0  Altered sleeping 0 0 0  Tired, decreased energy 2 3 0  Change in appetite 0 0 0  Feeling bad or failure about yourself  0 1 0  Trouble concentrating 0 1 0  Moving  slowly or fidgety/restless 0 1 0  Suicidal thoughts 0 0 0  PHQ-9 Score 3 8 0    Review of Systems  Musculoskeletal:        Pain in both knees  All other systems reviewed and are negative.      Objective:   Physical Exam   Gen: no distress, normal appearing HEENT: oral mucosa pink and moist, NCAT Cardio: Reg rate Chest: normal effort, normal rate of breathing Abd: soft, non-distended Ext: no edema Psych: pleasant, normal affect Skin: intact Neuro: alert and awake, follows commands RUE: 5/5 Deltoid, 5/5 Biceps, 5/5 Triceps, 5/5 5/5 Grip LUE: 5/5 Deltoid, 5/5 Biceps, 5/5 Triceps, 5/5 5/5 Grip RLE: HF 5/5, KE 5/5, ADF 5/5, APF 5/5 LLE: HF 5/5, KE 5/5, ADF 5/5, APF 5/5 Sensory exam normal for light touch and pain in all 4 limbs. No limb ataxia or cerebellar signs. No abnormal tone appreciated.   Musculoskeletal:  R knee tenderness > L knee tenderness  + b/l knee crepitus Pain with varus and valgus stress R knee  Very short step length, minimal knee bending during gait No ankle or hip tenderness       Assessment & Plan:   1) B/L Knee OA Right greater than Left  2) Hx of breast cancer   -Discussed PT, She reports limited by transportation/schedule -Consider Zilretta injection 3 months after last knee injection if cost is not prohibitive  -Zynex, Knee brace ordered -Hold off on TENS due to prior cancer Hx -UDS and pain agreement today -Consider tramadol TID PRN after UDS results -Consider Genicular nerve block   Reviewed UDS, 03/12/23 medication ordered as above. Called patient and  reviewed medication

## 2023-03-06 ENCOUNTER — Emergency Department (HOSPITAL_COMMUNITY)
Admission: EM | Admit: 2023-03-06 | Discharge: 2023-03-06 | Disposition: A | Discharge status: Against Medical Advice | Payer: 59 | Attending: Emergency Medicine | Admitting: Emergency Medicine

## 2023-03-06 ENCOUNTER — Other Ambulatory Visit: Payer: Self-pay

## 2023-03-06 ENCOUNTER — Encounter (HOSPITAL_COMMUNITY): Payer: Self-pay

## 2023-03-06 DIAGNOSIS — M7918 Myalgia, other site: Secondary | ICD-10-CM | POA: Diagnosis not present

## 2023-03-06 DIAGNOSIS — Z5321 Procedure and treatment not carried out due to patient leaving prior to being seen by health care provider: Secondary | ICD-10-CM | POA: Insufficient documentation

## 2023-03-06 DIAGNOSIS — R220 Localized swelling, mass and lump, head: Secondary | ICD-10-CM | POA: Diagnosis not present

## 2023-03-06 DIAGNOSIS — R42 Dizziness and giddiness: Secondary | ICD-10-CM | POA: Insufficient documentation

## 2023-03-06 DIAGNOSIS — R519 Headache, unspecified: Secondary | ICD-10-CM | POA: Insufficient documentation

## 2023-03-06 LAB — CBC WITH DIFFERENTIAL/PLATELET
Abs Immature Granulocytes: 0.3 10*3/uL — ABNORMAL HIGH (ref 0.00–0.07)
Band Neutrophils: 1 %
Basophils Absolute: 0 10*3/uL (ref 0.0–0.1)
Basophils Relative: 0 %
Eosinophils Absolute: 0.1 10*3/uL (ref 0.0–0.5)
Eosinophils Relative: 1 %
HCT: 32.8 % — ABNORMAL LOW (ref 36.0–46.0)
Hemoglobin: 10.4 g/dL — ABNORMAL LOW (ref 12.0–15.0)
Lymphocytes Relative: 11 %
Lymphs Abs: 1 10*3/uL (ref 0.7–4.0)
MCH: 29.4 pg (ref 26.0–34.0)
MCHC: 31.7 g/dL (ref 30.0–36.0)
MCV: 92.7 fL (ref 80.0–100.0)
Monocytes Absolute: 0.2 10*3/uL (ref 0.1–1.0)
Monocytes Relative: 2 %
Myelocytes: 3 %
Neutro Abs: 7.2 10*3/uL (ref 1.7–7.7)
Neutrophils Relative %: 82 %
Platelets: 138 10*3/uL — ABNORMAL LOW (ref 150–400)
RBC: 3.54 MIL/uL — ABNORMAL LOW (ref 3.87–5.11)
RDW: 15.4 % (ref 11.5–15.5)
WBC: 8.7 10*3/uL (ref 4.0–10.5)
nRBC: 0 % (ref 0.0–0.2)

## 2023-03-06 LAB — COMPREHENSIVE METABOLIC PANEL
ALT: 22 U/L (ref 0–44)
AST: 29 U/L (ref 15–41)
Albumin: 3.7 g/dL (ref 3.5–5.0)
Alkaline Phosphatase: 84 U/L (ref 38–126)
Anion gap: 8 (ref 5–15)
BUN: 9 mg/dL (ref 8–23)
CO2: 26 mmol/L (ref 22–32)
Calcium: 9 mg/dL (ref 8.9–10.3)
Chloride: 99 mmol/L (ref 98–111)
Creatinine, Ser: 0.64 mg/dL (ref 0.44–1.00)
GFR, Estimated: >60 mL/min (ref >60)
Glucose, Bld: 110 mg/dL — ABNORMAL HIGH (ref 70–99)
Potassium: 3.9 mmol/L (ref 3.5–5.1)
Sodium: 133 mmol/L — ABNORMAL LOW (ref 135–145)
Total Bilirubin: 1 mg/dL (ref 0.3–1.2)
Total Protein: 9.8 g/dL — ABNORMAL HIGH (ref 6.5–8.1)

## 2023-03-06 LAB — I-STAT CG4 LACTIC ACID, ED: Lactic Acid, Venous: 0.6 mmol/L (ref 0.5–1.9)

## 2023-03-06 MED ORDER — ACETAMINOPHEN 325 MG PO TABS
650.0000 mg | ORAL_TABLET | Freq: Once | ORAL | Status: DC
  Filled 2023-03-06: qty 2

## 2023-03-06 NOTE — ED Triage Notes (Signed)
Pt complaining of dizziness, headache, body aches, and swelling on the right side of her face. Pt states it has been going on since Wednesday. Denies N/V/D.

## 2023-03-08 ENCOUNTER — Ambulatory Visit: Payer: 59 | Attending: Physician Assistant | Admitting: Physician Assistant

## 2023-03-08 ENCOUNTER — Telehealth: Payer: Self-pay | Admitting: *Deleted

## 2023-03-08 ENCOUNTER — Telehealth: Payer: Self-pay

## 2023-03-08 DIAGNOSIS — H25813 Combined forms of age-related cataract, bilateral: Secondary | ICD-10-CM | POA: Diagnosis not present

## 2023-03-08 NOTE — Telephone Encounter (Signed)
Patient Name: Marie Church  DOB: [birth year] MRN: [id number]  Primary Cardiologist: Thurmon Fair, MD  Chart reviewed as part of pre-operative protocol coverage. Cataract extractions are recognized in guidelines as low risk surgeries that do not typically require specific preoperative testing or holding of blood thinner therapy. Therefore, given past medical history and time since last visit, based on ACC/AHA guidelines, Marie Church would be at acceptable risk for the planned procedure without further cardiovascular testing.   I will route this recommendation to the requesting party via Epic fax function and remove from pre-op pool.  Please call with questions.  Ronney Asters, NP 03/08/2023, 12:24 PM

## 2023-03-08 NOTE — Progress Notes (Signed)
This encounter was created in error - please disregard.

## 2023-03-08 NOTE — Telephone Encounter (Signed)
Pre-operative Risk Assessment    Patient Name: Marie Church  DOB: 03/25/1956 MRN: [id number]   DATE OF LAST VISIT: 01/27/23 HAO MENG, PAC DATE OF NEXT VISIT:  03/08/23 HAO MENG, [facility]  Request for Surgical Clearance    Procedure:   CATARACT EXTRACTION  Date of Surgery:  Clearance 04/02/23                                 Surgeon:  DR. Georges Mouse Surgeon's Group or Practice Name:  [facility] Phone number:  [phone] [phone] Fax number:  [phone]   Type of Clearance Requested:   - Medical ; NONE INDICATES NEEDING TO BE HELD   Type of Anesthesia:   IV SEDATION   Additional requests/questions:    Elpidio Anis   03/08/2023, 12:03 PM

## 2023-03-08 NOTE — Telephone Encounter (Signed)
Zilretta PA approved [id number] valid 03/08/23-03/07/24 one visit only

## 2023-03-09 DIAGNOSIS — M170 Bilateral primary osteoarthritis of knee: Secondary | ICD-10-CM | POA: Diagnosis not present

## 2023-03-10 LAB — DRUG TOX MONITOR 1 W/CONF, ORAL FLD

## 2023-03-10 LAB — DRUG TOX ALC METAB W/CON, ORAL FLD: Alcohol Metabolite: NEGATIVE ng/mL (ref <25)

## 2023-03-11 ENCOUNTER — Telehealth: Payer: Self-pay | Admitting: Physical Medicine & Rehabilitation

## 2023-03-11 NOTE — Telephone Encounter (Signed)
Patient would like a call back about UDS results.  Needing medication.

## 2023-03-12 MED ORDER — TRAMADOL HCL 50 MG PO TABS: 50.0000 mg | ORAL_TABLET | Freq: Three times a day (TID) | ORAL | 0 refills | Status: DC | PRN

## 2023-03-12 NOTE — Telephone Encounter (Signed)
Patient calling back states she is in a lot of pain and would like something called in for her knee pain , [facility]

## 2023-03-12 NOTE — Addendum Note (Signed)
Addended by: Fanny Dance on: 03/12/2023 04:29 PM   Modules accepted: Orders

## 2023-04-05 ENCOUNTER — Other Ambulatory Visit (HOSPITAL_COMMUNITY): Payer: Self-pay

## 2023-04-05 ENCOUNTER — Encounter: Payer: Self-pay | Admitting: Physical Medicine & Rehabilitation

## 2023-04-05 ENCOUNTER — Encounter: Payer: 59 | Attending: Physical Medicine & Rehabilitation | Admitting: Physical Medicine & Rehabilitation

## 2023-04-05 ENCOUNTER — Other Ambulatory Visit: Payer: Self-pay

## 2023-04-05 VITALS — BP 137/81 | HR 81 | Ht 66.0 in | Wt 191.2 lb

## 2023-04-05 DIAGNOSIS — M170 Bilateral primary osteoarthritis of knee: Secondary | ICD-10-CM | POA: Diagnosis not present

## 2023-04-05 DIAGNOSIS — G894 Chronic pain syndrome: Secondary | ICD-10-CM | POA: Diagnosis not present

## 2023-04-05 DIAGNOSIS — Z5181 Encounter for therapeutic drug level monitoring: Secondary | ICD-10-CM | POA: Diagnosis not present

## 2023-04-05 MED ORDER — HYDROCODONE-ACETAMINOPHEN 5-325 MG PO TABS: 1.0000 | ORAL_TABLET | Freq: Three times a day (TID) | ORAL | 0 refills | Status: DC | PRN

## 2023-04-05 NOTE — Progress Notes (Signed)
Subjective:    Patient ID: Marie Church, female    DOB: 1955-10-09, 67 y.o.   MRN: [id number]  HPI   HPI  Marie Church is a 67 y.o. year old female  who  has a past medical history of Breast cancer (HCC), DM type 2 (diabetes mellitus, type 2) (HCC), and Hypertension.   They are presenting to PM&R clinic as a new patient for pain management evaluation. They were referred by Dr. August Saucer for treatment of b/l Knee OA  pain.  Patient has had pain in her knees bilaterally for over 10 years.  Pain is worse in her right knee than her left knee.  Denies any history of trauma to her knees or surgery to her knees.  Standing and walking worsens her pain.  She had a right knee injection, was on by Dr. August Saucer 12/28/2022 for severe knee OA.  Dr. August Saucer discussed TKA however patient not interested at this time.  She previously had chemotherapy and x-ray treatment for breast cancer.   Red flag symptoms: No red flags for back pain endorsed in Hx or ROS  Medications tried:  Topical medications Voltaren doesn't help her- made it hurt worse Nsaids Ibuprofen minimal benefit  Tylenol  Helps slightly  Opiates  Tramadol helped her move around a little better Gabapentin / Lyrica  - Denies  TCAs  - Denies  SNRIs  Duloxetine - denies  Other  - denies   Other treatments: PT- for her knees was helpful a few years ago  TENs unit - denies  Injections Cortisone knee injection  Surgery denies    Interval History 04/05/23 Patient is here for follow-up regarding her severe chronic knee pain.  Pain continues to be poorly controlled and makes it very difficult for her to ambulate.  Pain continues to be worse over the right knee compared to her left knee.  Tramadol is no longer helping her pain and keep it under control.  She reports last cortisone injection she had in her right knee helped for a few weeks.  Pain Inventory Average Pain 9 Pain Right Now 8 My pain is constant and dull  In the last 24 hours,  has pain interfered with the following? General activity 10 Relation with others 9 Enjoyment of life 9 What TIME of day is your pain at its worst? morning , daytime, evening, and night Sleep (in general) Good  Pain is worse with: walking, standing, and some activites Pain improves with: medication and injections Relief from Meds: 2        Family History  Problem Relation Age of Onset   Heart failure Neg Hx    Heart disease Neg Hx    Heart attack Neg Hx    Social History   Socioeconomic History   Marital status: Widowed    Spouse name: Not on file   Number of children: Not on file   Years of education: Not on file   Highest education level: Not on file  Occupational History   Not on file  Tobacco Use   Smoking status: Former    Current packs/day: 0.00    Types: Cigarettes    Start date: 34    Quit date: 2000    Years since quitting: 24.8   Smokeless tobacco: Never  Vaping Use   Vaping status: Never Used  Substance and Sexual Activity   Alcohol use: Not Currently   Drug use: Never   Sexual activity: Not on file  Other Topics  Concern   Not on file  Social History Narrative   Not on file   Social Determinants of Health   Financial Resource Strain: Low Risk  (02/27/2022)   [facility], [facility]   Overall Financial Resource Strain (CARDIA)    Difficulty of Paying Living Expenses: Not hard at all  Food Insecurity: No Food Insecurity (07/30/2022)   Hunger Vital Sign    Worried About Running Out of Food in the Last Year: Never true    Ran Out of Food in the Last Year: Never true  Transportation Needs: No Transportation Needs (07/30/2022)   PRAPARE - Administrator, Civil Service (Medical): No    Lack of Transportation (Non-Medical): No  Physical Activity: Not on file  Stress: Stress Concern Present (02/04/2021)   [employer] of Occupational Health - Occupational  Stress Questionnaire    Feeling of Stress : To some extent  Social Connections: Unknown (07/14/2022)   Received from [facility], [facility]   Social Network    Social Network: Not on file   Past Surgical History:  Procedure Laterality Date   BREAST LUMPECTOMY Right 11/30/2020   BRONCHIAL BIOPSY  04/09/2021   Procedure: BRONCHIAL BIOPSIES;  Surgeon: Omar Person, MD;  Location: [facility];  Service: Pulmonary;;   BRONCHIAL NEEDLE ASPIRATION BIOPSY  04/09/2021   Procedure: BRONCHIAL NEEDLE ASPIRATION BIOPSIES;  Surgeon: Omar Person, MD;  Location: [facility];  Service: Pulmonary;;   BRONCHIAL WASHINGS  04/09/2021   Procedure: BRONCHIAL WASHINGS;  Surgeon: Omar Person, MD;  Location: [facility];  Service: Pulmonary;;   ENDOBRONCHIAL ULTRASOUND N/A 04/09/2021   Procedure: ENDOBRONCHIAL ULTRASOUND;  Surgeon: Omar Person, MD;  Location: [facility];  Service: Pulmonary;  Laterality: N/A;   HEMOSTASIS CONTROL  04/09/2021   Procedure: HEMOSTASIS CONTROL;  Surgeon: Omar Person, MD;  Location: [facility];  Service: Pulmonary;;   IR REMOVAL TUN ACCESS W/ PORT W/O FL MOD SED  07/30/2022   RIGHT/LEFT HEART CATH AND CORONARY ANGIOGRAPHY N/A 10/10/2020   Procedure: RIGHT/LEFT HEART CATH AND CORONARY ANGIOGRAPHY;  Surgeon: Dolores Patty, MD;  [facility];  Service: Cardiovascular;  Laterality: N/A;   VIDEO BRONCHOSCOPY N/A 04/09/2021   Procedure: VIDEO BRONCHOSCOPY WITH FLUORO;  Surgeon: Omar Person, MD;  Location: [facility];  Service: Pulmonary;  Laterality: N/A;   Past Medical History:  Diagnosis Date   Breast cancer (HCC)    DM type 2 (diabetes mellitus, type 2) (HCC)    reportedly steroid induced   Hypertension    There were no vitals taken for this visit.  Opioid Risk Score:   Fall Risk Score:  `1  Depression screen [facility] 2/9     03/05/2023    9:34 AM 12/21/2022    2:16 PM 08/20/2022    2:47 PM 08/20/2022     2:46 PM  Depression screen PHQ 2/9  Decreased Interest 2 1 1  0  Down, Depressed, Hopeless 0 0 1 0  PHQ - 2 Score 2 1 2  0  Altered sleeping 0 0 0 0  Tired, decreased energy 3 2 3  0  Change in appetite 0 0 0 0  Feeling bad or failure about yourself  0 0 1 0  Trouble concentrating 0 0 1 0  Moving slowly or fidgety/restless 0 0 1 0  Suicidal thoughts 0 0 0 0  PHQ-9 Score 5 3 8  0  Review of Systems  Constitutional: Negative.   HENT: Negative.    Eyes: Negative.   Respiratory: Negative.    Cardiovascular: Negative.   Gastrointestinal: Negative.   Endocrine: Negative.   Genitourinary: Negative.   Musculoskeletal:  Positive for gait problem.       Pain in both knees  Skin: Negative.   Allergic/Immunologic: Negative.   Hematological: Negative.   Psychiatric/Behavioral: Negative.    All other systems reviewed and are negative.      Objective:   Physical Exam   Gen: no distress, normal appearing HEENT: oral mucosa pink and moist, NCAT Chest: normal effort, normal rate of breathing Abd: soft, non-distended Ext: no edema Psych: pleasant, normal affect Skin: intact Neuro: alert and awake, follows commands RUE: 5/5 Deltoid, 5/5 Biceps, 5/5 Triceps, 5/5 5/5 Grip LUE: 5/5 Deltoid, 5/5 Biceps, 5/5 Triceps, 5/5 5/5 Grip RLE: HF 5/5, KE 5/5, ADF 5/5, APF 5/5 LLE: HF 5/5, KE 5/5, ADF 5/5, APF 5/5 Sensory exam normal for light touch and pain in all 4 limbs.   Musculoskeletal:  R knee tenderness > L knee tenderness  + b/l knee crepitus Pain with varus and valgus stress R knee  Very short step length, minimal knee bending during gait, furniture walking  No ankle or hip tenderness       Assessment & Plan:   1) B/L Knee OA Right greater than Left  2) Hx of breast cancer   -Discussed PT, She reports limited by transportation/schedule -Schedule for Zilretta injection next visit if cost is not prohibitive  -Zynex, Knee brace ordered prior visit -Hold off on TENS due to prior  cancer Hx -UDS and pain agreement today -DC tramadol 50mg  TID PRN -Start Norco 5-325 TID PRN -Consider Genicular nerve block

## 2023-04-06 ENCOUNTER — Other Ambulatory Visit: Payer: Self-pay

## 2023-04-09 ENCOUNTER — Other Ambulatory Visit: Payer: Self-pay

## 2023-04-09 ENCOUNTER — Other Ambulatory Visit (HOSPITAL_BASED_OUTPATIENT_CLINIC_OR_DEPARTMENT_OTHER): Payer: Self-pay

## 2023-04-22 ENCOUNTER — Encounter: Payer: Self-pay | Admitting: Physical Medicine & Rehabilitation

## 2023-04-22 ENCOUNTER — Encounter: Payer: 59 | Attending: Physical Medicine & Rehabilitation | Admitting: Physical Medicine & Rehabilitation

## 2023-04-22 VITALS — Ht 66.0 in | Wt 192.0 lb

## 2023-04-22 DIAGNOSIS — M170 Bilateral primary osteoarthritis of knee: Secondary | ICD-10-CM

## 2023-04-22 DIAGNOSIS — G894 Chronic pain syndrome: Secondary | ICD-10-CM | POA: Diagnosis not present

## 2023-04-22 DIAGNOSIS — Z5181 Encounter for therapeutic drug level monitoring: Secondary | ICD-10-CM

## 2023-04-22 MED ORDER — TRIAMCINOLONE ACETONIDE 32 MG IX SRER
32.0000 mg | Freq: Once | INTRA_ARTICULAR | Status: AC
  Administered 2023-04-22: 64 mg via INTRA_ARTICULAR

## 2023-04-22 NOTE — Progress Notes (Signed)
Subjective:    Patient ID: Marie Church, female    DOB: Aug 01, 1955, 67 y.o.   MRN: [id number]  HPI   HPI  Marie Church is a 67 y.o. year old female  who  has a past medical history of Breast cancer (HCC), DM type 2 (diabetes mellitus, type 2) (HCC), and Hypertension.   They are presenting to PM&R clinic as a new patient for pain management evaluation. They were referred by Dr. August Saucer for treatment of b/l Knee OA  pain.  Patient has had pain in her knees bilaterally for over 10 years.  Pain is worse in her right knee than her left knee.  Denies any history of trauma to her knees or surgery to her knees.  Standing and walking worsens her pain.  She had a right knee injection, was on by Dr. August Saucer 12/28/2022 for severe knee OA.  Dr. August Saucer discussed TKA however patient not interested at this time.  She previously had chemotherapy and x-ray treatment for breast cancer.   Red flag symptoms: No red flags for back pain endorsed in Hx or ROS  Medications tried:  Topical medications Voltaren doesn't help her- made it hurt worse Nsaids Ibuprofen minimal benefit  Tylenol  Helps slightly  Opiates  Tramadol helped her move around a little better Gabapentin / Lyrica  - Denies  TCAs  - Denies  SNRIs  Duloxetine - denies  Other  - denies   Other treatments: PT- for her knees was helpful a few years ago  TENs unit - denies  Injections Cortisone knee injection  Surgery denies    Interval History 04/05/23 Patient is here for follow-up regarding her severe chronic knee pain.  Pain continues to be poorly controlled and makes it very difficult for her to ambulate.  Pain continues to be worse over the right knee compared to her left knee.  Tramadol is no longer helping her pain and keep it under control.  She reports last cortisone injection she had in her right knee helped for a few weeks.  Interval History 04/22/2023 Patient is here for bilateral knee injection with Zilretta.  She reports she  has not had any knee injection in over 3 months.  She reports that Norco 5 has been helping keep her pain at a more tolerable level.  Denies any side effects with this medication.  Denies any new significant changes in her medical history.   Pain Inventory Average Pain 9 Pain Right Now 8 My pain is constant and dull  In the last 24 hours, has pain interfered with the following? General activity 10 Relation with others 9 Enjoyment of life 9 What TIME of day is your pain at its worst? morning , daytime, evening, and night Sleep (in general) Good  Pain is worse with: walking, standing, and some activites Pain improves with: medication and injections Relief from Meds: 2        Family History  Problem Relation Age of Onset   Heart failure Neg Hx    Heart disease Neg Hx    Heart attack Neg Hx    Social History   Socioeconomic History   Marital status: Widowed    Spouse name: Not on file   Number of children: Not on file   Years of education: Not on file   Highest education level: Not on file  Occupational History   Not on file  Tobacco Use   Smoking status: Former    Current packs/day: 0.00  Types: Cigarettes    Start date: 73    Quit date: 2000    Years since quitting: 24.9   Smokeless tobacco: Never  Vaping Use   Vaping status: Never Used  Substance and Sexual Activity   Alcohol use: Not Currently   Drug use: Never   Sexual activity: Not on file  Other Topics Concern   Not on file  Social History Narrative   Not on file   Social Determinants of Health   Financial Resource Strain: Low Risk  (02/27/2022)   [facility] from [facility], [facility], [facility], [facility]   Overall Financial Resource Strain (CARDIA)    Difficulty of Paying Living Expenses: Not hard at all  Food Insecurity: No Food Insecurity (07/30/2022)   Hunger Vital Sign    Worried About Running Out of Food in the Last Year: Never true    Ran Out  of Food in the Last Year: Never true  Transportation Needs: No Transportation Needs (07/30/2022)   PRAPARE - Administrator, Civil Service (Medical): No    Lack of Transportation (Non-Medical): No  Physical Activity: Not on file  Stress: Stress Concern Present (02/04/2021)   [employer] of Occupational Health - Occupational Stress Questionnaire    Feeling of Stress : To some extent  Social Connections: Unknown (07/14/2022)   Received from [facility] [place] [facility], [facility]   Social Network    Social Network: Not on file   Past Surgical History:  Procedure Laterality Date   BREAST LUMPECTOMY Right 11/30/2020   BRONCHIAL BIOPSY  04/09/2021   Procedure: BRONCHIAL BIOPSIES;  Surgeon: Omar Person, MD;  [facility];  Service: Pulmonary;;   BRONCHIAL NEEDLE ASPIRATION BIOPSY  04/09/2021   Procedure: BRONCHIAL NEEDLE ASPIRATION BIOPSIES;  Surgeon: Omar Person, MD;  [facility]: [facility];  Service: Pulmonary;;   BRONCHIAL WASHINGS  04/09/2021   Procedure: BRONCHIAL WASHINGS;  Surgeon: Omar Person, MD;  Location: [facility];  Service: Pulmonary;;   ENDOBRONCHIAL ULTRASOUND N/A 04/09/2021   Procedure: ENDOBRONCHIAL ULTRASOUND;  Surgeon: Omar Person, MD;  [facility];  Service: Pulmonary;  Laterality: N/A;   HEMOSTASIS CONTROL  04/09/2021   Procedure: HEMOSTASIS CONTROL;  Surgeon: Omar Person, MD;  Location: [facility];  Service: Pulmonary;;   IR REMOVAL TUN ACCESS W/ PORT W/O FL MOD SED  07/30/2022   RIGHT/LEFT HEART CATH AND CORONARY ANGIOGRAPHY N/A 10/10/2020   Procedure: RIGHT/LEFT HEART CATH AND CORONARY ANGIOGRAPHY;  Surgeon: Dolores Patty, MD;  [facility];  Service: Cardiovascular;  Laterality: N/A;   VIDEO BRONCHOSCOPY N/A 04/09/2021   Procedure: VIDEO BRONCHOSCOPY WITH FLUORO;  Surgeon: Omar Person, MD;  Location: [facility];  Service: Pulmonary;  Laterality: N/A;   Past Medical  History:  Diagnosis Date   Breast cancer (HCC)    DM type 2 (diabetes mellitus, type 2) (HCC)    reportedly steroid induced   Hypertension    Ht 5\' 6"  (1.676 m)   Wt 192 lb (87.1 kg)   BMI 30.99 kg/m   Opioid Risk Score:   Fall Risk Score:  `1  Depression screen [facility] 2/9     04/22/2023    2:31 PM 04/05/2023    3:25 PM 03/05/2023    9:34 AM 12/21/2022    2:16 PM 08/20/2022    2:47 PM 08/20/2022    2:46 PM  Depression screen PHQ 2/9  Decreased Interest 0 0 2 1  1 0  Down, Depressed, Hopeless 0 0 0 0 1 0  PHQ - 2 Score 0 0 2 1 2  0  Altered sleeping   0 0 0 0  Tired, decreased energy   3 2 3  0  Change in appetite   0 0 0 0  Feeling bad or failure about yourself    0 0 1 0  Trouble concentrating   0 0 1 0  Moving slowly or fidgety/restless   0 0 1 0  Suicidal thoughts   0 0 0 0  PHQ-9 Score   5 3 8  0    Review of Systems  Constitutional: Negative.   HENT: Negative.    Eyes: Negative.   Respiratory: Negative.    Cardiovascular: Negative.   Gastrointestinal: Negative.   Endocrine: Negative.   Genitourinary: Negative.   Musculoskeletal:  Positive for gait problem.       Pain in both knees  Skin: Negative.   Allergic/Immunologic: Negative.   Hematological: Negative.   Psychiatric/Behavioral: Negative.    All other systems reviewed and are negative.      Objective:   Physical Exam   Gen: no distress, normal appearing HEENT: oral mucosa pink and moist, NCAT Chest: normal effort, normal rate of breathing Abd: soft, non-distended Ext: no edema Psych: pleasant, normal affect Skin: intact Neuro: alert and awake, follows commands RUE: 5/5 Deltoid, 5/5 Biceps, 5/5 Triceps, 5/5 5/5 Grip LUE: 5/5 Deltoid, 5/5 Biceps, 5/5 Triceps, 5/5 5/5 Grip RLE: HF 5/5, KE 5/5, ADF 5/5, APF 5/5 LLE: HF 5/5, KE 5/5, ADF 5/5, APF 5/5 Sensory exam normal for light touch and pain in all 4 limbs.   Musculoskeletal:  R knee tenderness > L knee tenderness  + b/l knee crepitus Pain with  varus and valgus stress R knee  Very short step length Wearing knee brace on the right No ankle or hip tenderness       Assessment & Plan:   1) B/L Knee OA Right greater than Left  2) Hx of breast cancer   -Discussed PT, She reports limited by transportation/schedule -Zynex, Knee brace ordered prior visit-reviewed how to put this on with patient today -Hold off on TENS due to prior cancer Hx -UDS and pain agreement today -Tramadol 50mg  TID PRN-was discontinued prior visit -Continue Norco 5-325 TID PRN -Continue to monitor PDMP, pill counts, random UDS -Consider Genicular nerve block   Bilateral knee injection   Indication:Knee pain not relieved by medication management and other conservative care.  Informed consent was obtained after describing risks and benefits of the procedure with the patient, this includes bleeding, bruising, infection and medication side effects. The patient wishes to proceed and has given written consent. The patient was placed in a recumbent position. The Lateral aspect of the knee was marked and prepped with Betadine and alcohol. It was then entered with a 21-gauge 1-1/2 inch needle.  After negative draw back for blood, a solution containing 5 ML of 32 mg Zilretta solution was injected. The patient tolerated the procedure well. Post procedure instructions were given.   Procedure was completed for both knees

## 2023-04-29 ENCOUNTER — Other Ambulatory Visit: Payer: Self-pay | Admitting: Nurse Practitioner

## 2023-04-29 ENCOUNTER — Encounter: Payer: Self-pay | Admitting: Nurse Practitioner

## 2023-04-29 ENCOUNTER — Ambulatory Visit: Payer: 59 | Attending: Nurse Practitioner | Admitting: Nurse Practitioner

## 2023-04-29 VITALS — BP 130/82 | HR 84 | Ht 66.0 in | Wt 189.8 lb

## 2023-04-29 DIAGNOSIS — D170 Benign lipomatous neoplasm of skin and subcutaneous tissue of head, face and neck: Secondary | ICD-10-CM | POA: Diagnosis not present

## 2023-04-29 DIAGNOSIS — I509 Heart failure, unspecified: Secondary | ICD-10-CM | POA: Diagnosis not present

## 2023-04-29 DIAGNOSIS — Z7984 Long term (current) use of oral hypoglycemic drugs: Secondary | ICD-10-CM

## 2023-04-29 DIAGNOSIS — E1165 Type 2 diabetes mellitus with hyperglycemia: Secondary | ICD-10-CM

## 2023-04-29 DIAGNOSIS — Z1211 Encounter for screening for malignant neoplasm of colon: Secondary | ICD-10-CM | POA: Diagnosis not present

## 2023-04-29 DIAGNOSIS — I10 Essential (primary) hypertension: Secondary | ICD-10-CM

## 2023-04-29 DIAGNOSIS — D649 Anemia, unspecified: Secondary | ICD-10-CM | POA: Diagnosis not present

## 2023-04-29 MED ORDER — CARVEDILOL 12.5 MG PO TABS
12.5000 mg | ORAL_TABLET | Freq: Two times a day (BID) | ORAL | 0 refills | Status: DC
Start: 2023-04-29 — End: 2023-09-22

## 2023-04-29 MED ORDER — CARVEDILOL 12.5 MG PO TABS: 12.5000 mg | ORAL_TABLET | Freq: Two times a day (BID) | ORAL | 1 refills | Status: DC

## 2023-04-29 MED ORDER — ENTRESTO 24-26 MG PO TABS: 1.0000 | ORAL_TABLET | Freq: Two times a day (BID) | ORAL | 0 refills | Status: DC

## 2023-04-29 MED ORDER — SPIRONOLACTONE 25 MG PO TABS
25.0000 mg | ORAL_TABLET | Freq: Every day | ORAL | 0 refills | Status: DC
Start: 2023-04-29 — End: 2023-09-13

## 2023-04-29 MED ORDER — FARXIGA 10 MG PO TABS: 10.0000 mg | ORAL_TABLET | Freq: Every day | ORAL | 1 refills | Status: DC

## 2023-04-29 MED ORDER — FARXIGA 10 MG PO TABS: 10.0000 mg | ORAL_TABLET | Freq: Every day | ORAL | 0 refills | Status: DC

## 2023-04-29 MED ORDER — ENTRESTO 24-26 MG PO TABS: 1.0000 | ORAL_TABLET | Freq: Two times a day (BID) | ORAL | 1 refills | Status: DC

## 2023-04-29 NOTE — Patient Instructions (Addendum)
[facility]  [address] [place], [place] [address] PH# [phone]   [facility] [address], [place], [place] [address] Phone: [phone]  [facility]  [address] [place], [place] [address]  Ph# [phone] Fax [phone]    CALL CARDIOLOGY TO SCHEDULE F/U appt                                            ::::>>>>>>>

## 2023-04-29 NOTE — Progress Notes (Addendum)
Assessment & Plan:  Marie Church was seen today for medical management of chronic issues.  Diagnoses and all orders for this visit:  Primary hypertension -     carvedilol (COREG) 12.5 MG tablet; Take 1 tablet (12.5 mg total) by mouth 2 (two) times daily with a meal. -     CMP14+EGFR Continue all antihypertensives as prescribed.  Reminded to bring in blood pressure log for follow  up appointment.  RECOMMENDATIONS: DASH/Mediterranean Diets are healthier choices for HTN.    Chronic congestive heart failure, unspecified heart failure type  Needs to follow-up with cardiology as soon as possible. -     carvedilol (COREG) 12.5 MG tablet; Take 1 tablet (12.5 mg total) by mouth 2 (two) times daily with a meal. -     sacubitril-valsartan (ENTRESTO) 24-26 MG; Take 1 tablet by mouth 2 (two) times daily.  Type 2 diabetes mellitus with hyperglycemia, without long-term current use of insulin (HCC) -     FARXIGA 10 MG TABS tablet; Take 1 tablet (10 mg total) by mouth daily.  Lipoma of head -     Ambulatory referral to Dermatology  Anemia, unspecified type -     Ambulatory referral to Gastroenterology -     CBC with Differential  Colon cancer screening -     Ambulatory referral to Gastroenterology    Patient has been counseled on age-appropriate routine health concerns for screening and prevention. These are reviewed and up-to-date. Referrals have been placed accordingly. Immunizations are up-to-date or declined.    Subjective:   Chief Complaint  Patient presents with   Medical Management of Chronic Issues    Marie Church 67 y.o. female presents to office today for HTN  She has a past medical history of chronic pain syndrome and bilateral knee osteoarthritis malignant neoplasm of right breast, DM type 2, and Hypertension.    HTN Patient blood pressure is well-controlled today with carvedilol 12.5 mg twice daily, Entresto 24-26 mg twice daily and spironolactone 25 mg daily.  She is  overdue for cardiology follow-up. BP Readings from Last 3 Encounters:  04/29/23 130/82  04/05/23 137/81  03/06/23 (!) 146/98     She was previously seeing an oncologist in [place]. Has been lost to follow up since January of this year. I referred her to oncology here in [place] several months ago and they were unsuccessful with reaching her to schedule.  I have given her their phone number and requested that she call them to get scheduled as soon as possible.  She is also overdue for mammogram and I have instructed her to call the breast center (she was given this number as well) to call and get scheduled for mammogram that is several months overdue. She also has anemia and has never had a colonoscopy.  She was also referred to GI several months ago and has yet to follow-up.  She was also given their phone number to contact to schedule. PER ONCOLOGY NOTE 05-29-2022 Port placed 04/03/20 ([place])   C1 HP only due to poor PS. 05/16/20 added weekly Taxol. (THP)  She had Herceptin 5/13 and then was admitted with CHF [facility]) prior to her next treatment   She ended up having lumpectomy 11/2020 - 6 foci of IDC, largest 4mm but did have LVI and 2/18 nodes  12/2020 re-excision of positive margin   Completed 5 cycles Kadcyla 06/20/2021 - stopped due to low PLT 2. Inguinal LAD; bx inflammatory/reactive 3. Anemia  4. Rash - followed with derm -  resolved. 5. Weakness  6. Hyperglycemia - improved 7. Elevated LFT's - now WNL 8. Neuropathy - discussed gabapentin. She will try to starting with 1 tab (100 mg) for several nights and increase by 100 mg if needed x 2. She will cap at 300 mg q HS and let us know how she is doing.Strongly encouraged her to do so today. 9. She will require an ECHO every 3 months while receiving Herceptin; baseline normal. Last obtained 09/2021 PLAN:  Continue close surveillance for now  Recommend leave port in place due to ongoing workup and labs as needed  Pt had to  cancel BMBx due to illness - still would like to get to eval for abnormal labs - reordered   Bilateral mammo due 2/9  RTC 26mo w/ labs and OV  Has not gotten MRI brain that was ordered but no longer having headaches - will monitor  PLAN: 05/29/22  Continue close surveillance for now  Recommend leave port in place due to ongoing workup and labs as needed  Pt had to cancel BMBx due to illness - still would like to get to eval for abnormal labs - reordered   Bilateral mammo due 2/9  RTC 26mo w/ labs and OV  Has not gotten MRI brain that was ordered but no longer having headaches - will monitor    Dermatology  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 States it has been there for 20 years.     Review of Systems  Constitutional:  Negative for fever, malaise/fatigue and weight loss.  HENT: Negative.  Negative for nosebleeds.   Eyes: Negative.  Negative for blurred vision, double vision and photophobia.  Respiratory: Negative.  Negative for cough and shortness of breath.   Cardiovascular: Negative.  Negative for chest pain, palpitations and leg swelling.  Gastrointestinal: Negative.  Negative for heartburn, nausea and vomiting.  Musculoskeletal: Negative.  Negative for myalgias.  Skin:        "Lump on neck"  Neurological: Negative.  Negative for dizziness, focal weakness, seizures and headaches.  Psychiatric/Behavioral: Negative.  Negative for suicidal ideas.     Past Medical History:  Diagnosis Date   Breast cancer (HCC)    DM type 2 (diabetes mellitus, type 2) (HCC)    reportedly steroid induced   Hypertension     Past Surgical History:  Procedure Laterality Date   BREAST LUMPECTOMY Right 11/30/2020   BRONCHIAL BIOPSY  04/09/2021   Procedure: BRONCHIAL BIOPSIES;  Surgeon: Omar Person, MD;  Location: [facility];  Service: Pulmonary;;   BRONCHIAL NEEDLE ASPIRATION BIOPSY  04/09/2021   Procedure: BRONCHIAL NEEDLE ASPIRATION BIOPSIES;  Surgeon: Omar Person, MD;  Location: [facility];  Service: Pulmonary;;   BRONCHIAL WASHINGS  04/09/2021   Procedure: BRONCHIAL WASHINGS;  Surgeon: Omar Person, MD;  Location: [facility];  Service: Pulmonary;;   ENDOBRONCHIAL ULTRASOUND N/A 04/09/2021   Procedure: ENDOBRONCHIAL ULTRASOUND;  Surgeon: Omar Person, MD;  [facility]: [facility];  Service: Pulmonary;  Laterality: N/A;   HEMOSTASIS CONTROL  04/09/2021   Procedure: HEMOSTASIS CONTROL;  Surgeon: Omar Person, MD;  [facility];  Service: Pulmonary;;   IR REMOVAL TUN ACCESS W/ PORT W/O FL MOD SED  07/30/2022   RIGHT/LEFT HEART CATH AND CORONARY ANGIOGRAPHY N/A 10/10/2020   Procedure: RIGHT/LEFT HEART CATH AND CORONARY ANGIOGRAPHY;  Surgeon: Dolores Patty, MD;  [facility];  Service: Cardiovascular;  Laterality: N/A;   VIDEO BRONCHOSCOPY N/A 04/09/2021  Procedure: VIDEO BRONCHOSCOPY WITH FLUORO;  Surgeon: Omar Person, MD;  Location: [facility];  Service: Pulmonary;  Laterality: N/A;    Family History  Problem Relation Age of Onset   Heart failure Neg Hx    Heart disease Neg Hx    Heart attack Neg Hx     Social History Reviewed with no changes to be made today.   Outpatient Medications Prior to Visit  Medication Sig Dispense Refill   famotidine (PEPCID) 40 MG tablet Take 1 tablet (40 mg total) by mouth daily. 90 tablet 3   HYDROcodone-acetaminophen (NORCO/VICODIN) 5-325 MG tablet Take 1 tablet by mouth every 8 (eight) hours as needed for moderate pain (pain score 4-6). 90 tablet 0   spironolactone (ALDACTONE) 25 MG tablet Take 1 tablet (25 mg total) by mouth daily. 90 tablet 1   carvedilol (COREG) 12.5 MG tablet Take 1 tablet (12.5 mg total) by mouth 2 (two) times daily with a meal. 180 tablet 1   FARXIGA 10 MG TABS tablet Take 1 tablet (10 mg total) by mouth daily. 90 tablet 1   sacubitril-valsartan (ENTRESTO) 24-26 MG Take 1 tablet by mouth 2 (two) times daily. 180 tablet 1    prednisoLONE acetate (PRED FORTE) 1 % ophthalmic suspension  (Patient not taking: Reported on 04/29/2023)     No facility-administered medications prior to visit.    Allergies  Allergen Reactions   Doxycycline Rash   Amoxicillin-Pot Clavulanate Rash    Tolerates Rocephin   Lisinopril Rash       Objective:    BP 130/82 (BP Location: Left Arm, Patient Position: Sitting, Cuff Size: Large)   Pulse 84   Ht 5\' 6"  (1.676 m)   Wt 189 lb 12.8 oz (86.1 kg)   SpO2 98%   BMI 30.63 kg/m  Wt Readings from Last 3 Encounters:  04/29/23 189 lb 12.8 oz (86.1 kg)  04/22/23 192 lb (87.1 kg)  04/05/23 191 lb 3.2 oz (86.7 kg)    Physical Exam Vitals and nursing note reviewed.  Constitutional:      Appearance: She is well-developed.  HENT:     Head: Normocephalic and atraumatic.     Mouth/Throat:     Dentition: Abnormal dentition.  Cardiovascular:     Rate and Rhythm: Normal rate and regular rhythm.     Heart sounds: Normal heart sounds. No murmur heard.    No friction rub. No gallop.  Pulmonary:     Effort: Pulmonary effort is normal. No tachypnea or respiratory distress.     Breath sounds: Normal breath sounds. No decreased breath sounds, wheezing, rhonchi or rales.  Chest:     Chest wall: No tenderness.  Abdominal:     General: Bowel sounds are normal.     Palpations: Abdomen is soft.  Musculoskeletal:        General: Normal range of motion.     Cervical back: Normal range of motion.  Skin:    General: Skin is warm and dry.     Comments: See photo attached  Neurological:     Mental Status: She is alert and oriented to person, place, and time.     Coordination: Coordination normal.  Psychiatric:        Behavior: Behavior normal. Behavior is cooperative.        Thought Content: Thought content normal.        Judgment: Judgment normal.          Patient has been counseled extensively about nutrition and exercise  as well as the importance of adherence with medications  and regular follow-up. The patient was given clear instructions to go to ER or return to medical center if symptoms don't improve, worsen or new problems develop. The patient verbalized understanding.   Follow-up: Return in about 3 months (around 07/28/2023).   Claiborne Rigg, FNP-BC [name] [place], [place] [phone]   04/29/2023, 11:40 AM

## 2023-04-30 ENCOUNTER — Telehealth: Payer: Self-pay | Admitting: Nurse Practitioner

## 2023-04-30 LAB — CMP14+EGFR
ALT: 40 [IU]/L — ABNORMAL HIGH (ref 0–32)
AST: 24 [IU]/L (ref 0–40)
Albumin: 4.4 g/dL (ref 3.9–4.9)
Alkaline Phosphatase: 91 [IU]/L (ref 44–121)
BUN/Creatinine Ratio: 24 (ref 12–28)
BUN: 20 mg/dL (ref 8–27)
Bilirubin Total: 0.5 mg/dL (ref 0.0–1.2)
CO2: 22 mmol/L (ref 20–29)
Calcium: 9.5 mg/dL (ref 8.7–10.3)
Chloride: 102 mmol/L (ref 96–106)
Creatinine, Ser: 0.82 mg/dL (ref 0.57–1.00)
Globulin, Total: 5 g/dL — ABNORMAL HIGH (ref 1.5–4.5)
Glucose: 96 mg/dL (ref 70–99)
Potassium: 4.5 mmol/L (ref 3.5–5.2)
Sodium: 137 mmol/L (ref 134–144)
Total Protein: 9.4 g/dL — ABNORMAL HIGH (ref 6.0–8.5)
eGFR: 79 mL/min/{1.73_m2} (ref >59)

## 2023-04-30 LAB — CBC WITH DIFFERENTIAL/PLATELET
Basophils Absolute: 0 10*3/uL (ref 0.0–0.2)
Basos: 0 %
EOS (ABSOLUTE): 0 10*3/uL (ref 0.0–0.4)
Eos: 0 %
Hematocrit: 34.9 % (ref 34.0–46.6)
Hemoglobin: 11.2 g/dL (ref 11.1–15.9)
Lymphocytes Absolute: 3.3 10*3/uL — ABNORMAL HIGH (ref 0.7–3.1)
Lymphs: 49 %
MCH: 29.3 pg (ref 26.6–33.0)
MCHC: 32.1 g/dL (ref 31.5–35.7)
MCV: 91 fL (ref 79–97)
Monocytes Absolute: 0.2 10*3/uL (ref 0.1–0.9)
Monocytes: 3 %
Neutrophils Absolute: 3.1 10*3/uL (ref 1.4–7.0)
Neutrophils: 47 %
Platelets: 230 10*3/uL (ref 150–450)
RBC: 3.82 x10E6/uL (ref 3.77–5.28)
RDW: 16.7 % — ABNORMAL HIGH (ref 11.7–15.4)
WBC: 6.7 10*3/uL (ref 3.4–10.8)

## 2023-04-30 LAB — IMMATURE CELLS: Metamyelocytes: 1 % — ABNORMAL HIGH (ref 0–0)

## 2023-05-03 ENCOUNTER — Telehealth: Payer: Self-pay | Admitting: Nurse Practitioner

## 2023-05-03 ENCOUNTER — Encounter: Payer: Self-pay | Admitting: Gastroenterology

## 2023-05-03 NOTE — Telephone Encounter (Signed)
Pt. Given lab results and instructions.Verbalizes understanding. 

## 2023-05-21 ENCOUNTER — Telehealth: Payer: Self-pay | Admitting: *Deleted

## 2023-05-21 ENCOUNTER — Ambulatory Visit: Payer: 59 | Admitting: *Deleted

## 2023-05-21 VITALS — Ht 66.0 in | Wt 189.0 lb

## 2023-05-21 DIAGNOSIS — Z1211 Encounter for screening for malignant neoplasm of colon: Secondary | ICD-10-CM

## 2023-05-21 MED ORDER — NA SULFATE-K SULFATE-MG SULF 17.5-3.13-1.6 GM/177ML PO SOLN: 1.0000 | Freq: Once | ORAL | 0 refills | Status: AC

## 2023-05-21 NOTE — Progress Notes (Unsigned)
 Pt's name and DOB verified at the beginning of the pre-visit wit 2 identifiers  Pt denies any difficulty with ambulating,sitting, laying down or rolling side to side  Pt has no issues with ambulation   Pt has no issues moving head neck or swallowing  No egg or soy allergy known to patient   No issues known to pt with past sedation with any surgeries or procedures  Pt denies having issues being intubated  No FH of Malignant Hyperthermia  Pt is not on diet pills or shots  Pt is not on home 02   Pt is not on blood thinners   Pt denies issues with constipation   Pt is not on dialysis  Pt denise any abnormal heart rhythms   Pt denies any upcoming cardiac testing  Pt encouraged to use to use Singlecare or Goodrx to reduce cost   Patient's chart reviewed by Cathlyn Parsons CNRA prior to pre-visit and patient appropriate for the [facility].  Pre-visit completed and red dot placed by patient's name on their procedure day (on provider's schedule).  .  Visit by phone  Pt states weight is 189 lb  Instructed pt why it is important to and  to call if they have any changes in health or new medications. Directed them to the # given and on instructions.     Instructions reviewed. Pt given both [facility] main # and MD on call # prior to instructions.  Pt states understanding. Instructed to review again prior to procedure. Pt states they will.   Instructions sent by mail with coupon and by My Chart  Coupon sent via text to mobile phone and pt verified they received it

## 2023-05-21 NOTE — Telephone Encounter (Signed)
Attempt to reach pt for pre-visit. LM with call back #.   Will attempt other number in profile Second attempt reached pt

## 2023-05-30 ENCOUNTER — Encounter: Payer: Self-pay | Admitting: Certified Registered Nurse Anesthetist

## 2023-06-03 ENCOUNTER — Encounter: Payer: 59 | Attending: Physical Medicine & Rehabilitation | Admitting: Physical Medicine & Rehabilitation

## 2023-06-03 DIAGNOSIS — M170 Bilateral primary osteoarthritis of knee: Secondary | ICD-10-CM | POA: Insufficient documentation

## 2023-06-03 DIAGNOSIS — G894 Chronic pain syndrome: Secondary | ICD-10-CM | POA: Insufficient documentation

## 2023-06-03 DIAGNOSIS — Z5181 Encounter for therapeutic drug level monitoring: Secondary | ICD-10-CM | POA: Insufficient documentation

## 2023-06-07 ENCOUNTER — Encounter: Payer: 59 | Admitting: Gastroenterology

## 2023-06-07 ENCOUNTER — Telehealth: Payer: Self-pay | Admitting: Gastroenterology

## 2023-06-07 NOTE — Telephone Encounter (Signed)
Hi Dr Adela Lank,   I called patient for her procedure appointment arrival time at 3, no answer.  I will no show her for today's appointment.   Thank you

## 2023-06-10 ENCOUNTER — Ambulatory Visit
Admission: RE | Admit: 2023-06-10 | Discharge: 2023-06-10 | Disposition: A | Discharge status: Home / Self Care | Payer: 59 | Source: Ambulatory Visit | Attending: Nurse Practitioner | Admitting: Nurse Practitioner

## 2023-06-10 ENCOUNTER — Telehealth: Payer: Self-pay | Admitting: Physical Medicine & Rehabilitation

## 2023-06-10 DIAGNOSIS — Z1231 Encounter for screening mammogram for malignant neoplasm of breast: Secondary | ICD-10-CM

## 2023-06-10 NOTE — Telephone Encounter (Signed)
Patient called in to reschedule missed appt to 1/27 , patient is requesting a refill on Hydrocodone ,patient uses [facility] on [address]

## 2023-06-11 NOTE — Progress Notes (Unsigned)
Subjective:    Patient ID: Marie Church, female    DOB: 1956-03-11, 68 y.o.   MRN: [id number]  HPI   Pain Inventory Average Pain {NUMBERS; 0-10:5044} Pain Right Now {NUMBERS; 0-10:5044} My pain is {PAIN DESCRIPTION:21022940}  In the last 24 hours, has pain interfered with the following? General activity {NUMBERS; 0-10:5044} Relation with others {NUMBERS; 0-10:5044} Enjoyment of life {NUMBERS; 0-10:5044} What TIME of day is your pain at its worst? {time of day:24191} Sleep (in general) {BHH GOOD/FAIR/POOR:22877}  Pain is worse with: {ACTIVITIES:21022942} Pain improves with: {PAIN IMPROVES WUJW:11914782} Relief from Meds: {NUMBERS; 0-10:5044}  Family History  Problem Relation Age of Onset   Heart failure Neg Hx    Heart disease Neg Hx    Heart attack Neg Hx    Colon polyps Neg Hx    Colon cancer Neg Hx    Esophageal cancer Neg Hx    Stomach cancer Neg Hx    Rectal cancer Neg Hx    Social History   Socioeconomic History   Marital status: Widowed    Spouse name: Not on file   Number of children: Not on file   Years of education: Not on file   Highest education level: Not on file  Occupational History   Not on file  Tobacco Use   Smoking status: Former    Current packs/day: 0.00    Types: Cigarettes    Start date: 26    Quit date: 2000    Years since quitting: 25.0   Smokeless tobacco: Never  Vaping Use   Vaping status: Never Used  Substance and Sexual Activity   Alcohol use: Not Currently   Drug use: Never   Sexual activity: Not on file  Other Topics Concern   Not on file  Social History Narrative   Not on file   Social Drivers of Health   Financial Resource Strain: Low Risk  (02/27/2022)   [facility]   Overall Financial Resource Strain (CARDIA)    Difficulty of Paying Living Expenses: Not hard at all  Food Insecurity: No Food Insecurity (07/30/2022)    Hunger Vital Sign    Worried About Running Out of Food in the Last Year: Never true    Ran Out of Food in the Last Year: Never true  Transportation Needs: No Transportation Needs (07/30/2022)   PRAPARE - Administrator, Civil Service (Medical): No    Lack of Transportation (Non-Medical): No  Physical Activity: Not on file  Stress: Stress Concern Present (02/04/2021)   [employer] of Occupational Health - Occupational Stress Questionnaire    Feeling of Stress : To some extent  Social Connections: Unknown (07/14/2022)   [facility], [facility]   Social Network    Social Network: Not on file   Past Surgical History:  Procedure Laterality Date   BREAST LUMPECTOMY Right 11/30/2020   BRONCHIAL BIOPSY  04/09/2021   Procedure: BRONCHIAL BIOPSIES;  Surgeon: Omar Person, MD;  [facility]: [facility];  Service: Pulmonary;;   BRONCHIAL NEEDLE ASPIRATION BIOPSY  04/09/2021   Procedure: BRONCHIAL NEEDLE ASPIRATION BIOPSIES;  Surgeon: Omar Person, MD;  [facility]: [facility];  Service: Pulmonary;;   BRONCHIAL WASHINGS  04/09/2021   Procedure: BRONCHIAL WASHINGS;  Surgeon: Omar Person, MD;  Location: [facility];  Service: Pulmonary;;   ENDOBRONCHIAL ULTRASOUND N/A 04/09/2021   Procedure: ENDOBRONCHIAL ULTRASOUND;  Surgeon: Omar Person,  MD;  [facility];  Service: Pulmonary;  Laterality: N/A;   HEMOSTASIS CONTROL  04/09/2021   Procedure: HEMOSTASIS CONTROL;  Surgeon: Omar Person, MD;  [facility]: [facility];  Service: Pulmonary;;   IR REMOVAL TUN ACCESS W/ PORT W/O FL MOD SED  07/30/2022   RIGHT/LEFT HEART CATH AND CORONARY ANGIOGRAPHY N/A 10/10/2020   Procedure: RIGHT/LEFT HEART CATH AND CORONARY ANGIOGRAPHY;  Surgeon: Dolores Patty, MD;  [facility];  Service: Cardiovascular;  Laterality: N/A;   VIDEO BRONCHOSCOPY N/A 04/09/2021   Procedure: VIDEO BRONCHOSCOPY WITH FLUORO;  Surgeon: Omar Person, MD;  Location: [facility];  Service: Pulmonary;  Laterality: N/A;   Past Surgical History:  Procedure Laterality Date   BREAST LUMPECTOMY Right 11/30/2020   BRONCHIAL BIOPSY  04/09/2021   Procedure: BRONCHIAL BIOPSIES;  Surgeon: Omar Person, MD;  [facility]: [facility];  Service: Pulmonary;;   BRONCHIAL NEEDLE ASPIRATION BIOPSY  04/09/2021   Procedure: BRONCHIAL NEEDLE ASPIRATION BIOPSIES;  Surgeon: Omar Person, MD;  [facility];  Service: Pulmonary;;   BRONCHIAL WASHINGS  04/09/2021   Procedure: BRONCHIAL WASHINGS;  Surgeon: Omar Person, MD;  Location: [facility];  Service: Pulmonary;;   ENDOBRONCHIAL ULTRASOUND N/A 04/09/2021   Procedure: ENDOBRONCHIAL ULTRASOUND;  Surgeon: Omar Person, MD;  [facility];  Service: Pulmonary;  Laterality: N/A;   HEMOSTASIS CONTROL  04/09/2021   Procedure: HEMOSTASIS CONTROL;  Surgeon: Omar Person, MD;  [facility]: [facility];  Service: Pulmonary;;   IR REMOVAL TUN ACCESS W/ PORT W/O FL MOD SED  07/30/2022   RIGHT/LEFT HEART CATH AND CORONARY ANGIOGRAPHY N/A 10/10/2020   Procedure: RIGHT/LEFT HEART CATH AND CORONARY ANGIOGRAPHY;  Surgeon: Dolores Patty, MD;  [facility];  Service: Cardiovascular;  Laterality: N/A;   VIDEO BRONCHOSCOPY N/A 04/09/2021   Procedure: VIDEO BRONCHOSCOPY WITH FLUORO;  Surgeon: Omar Person, MD;  [facility]: [facility];  Service: Pulmonary;  Laterality: N/A;   Past Medical History:  Diagnosis Date   Anemia    Anxiety    Arthritis    Breast cancer (HCC)    Cataract    CHF (congestive heart failure) (HCC)    Pt states issues is resolved   DM type 2 (diabetes mellitus, type 2) (HCC)    reportedly steroid induced   Hypertension    There were no vitals taken for this visit.  Opioid Risk Score:   Fall Risk Score:  `1  Depression screen [facility] 2/9     04/29/2023    9:32 AM 04/22/2023    2:31 PM 04/05/2023    3:25 PM  03/05/2023    9:34 AM 12/21/2022    2:16 PM 08/20/2022    2:47 PM 08/20/2022    2:46 PM  Depression screen PHQ 2/9  Decreased Interest 0 0 0 2 1 1  0  Down, Depressed, Hopeless 0 0 0 0 0 1 0  PHQ - 2 Score 0 0 0 2 1 2  0  Altered sleeping 0   0 0 0 0  Tired, decreased energy 3   3 2 3  0  Change in appetite 0   0 0 0 0  Feeling bad or failure about yourself  0   0 0 1 0  Trouble concentrating 0   0 0 1 0  Moving slowly or fidgety/restless 0   0 0 1 0  Suicidal thoughts 0   0 0 0 0  PHQ-9 Score 3   5  3 8 0  Difficult doing work/chores Extremely dIfficult          Review of Systems     Objective:   Physical Exam        Assessment & Plan:

## 2023-06-12 MED ORDER — HYDROCODONE-ACETAMINOPHEN 5-325 MG PO TABS
1.0000 | ORAL_TABLET | Freq: Three times a day (TID) | ORAL | 0 refills | Status: DC | PRN
  Filled 2023-06-12: qty 90, 30d supply, fill #0

## 2023-06-12 NOTE — Addendum Note (Signed)
Addended by: Fanny Dance on: 06/12/2023 01:12 AM   Modules accepted: Orders

## 2023-06-14 ENCOUNTER — Other Ambulatory Visit: Payer: Self-pay

## 2023-06-14 ENCOUNTER — Encounter: Payer: Self-pay | Admitting: Physical Medicine & Rehabilitation

## 2023-06-14 ENCOUNTER — Encounter (HOSPITAL_BASED_OUTPATIENT_CLINIC_OR_DEPARTMENT_OTHER): Payer: 59 | Admitting: Physical Medicine & Rehabilitation

## 2023-06-14 VITALS — BP 138/77 | HR 78 | Ht 66.0 in | Wt 196.0 lb

## 2023-06-14 DIAGNOSIS — G894 Chronic pain syndrome: Secondary | ICD-10-CM

## 2023-06-14 DIAGNOSIS — M170 Bilateral primary osteoarthritis of knee: Secondary | ICD-10-CM | POA: Diagnosis not present

## 2023-06-14 DIAGNOSIS — Z5181 Encounter for therapeutic drug level monitoring: Secondary | ICD-10-CM

## 2023-06-14 MED ORDER — HYDROCODONE-ACETAMINOPHEN 5-325 MG PO TABS: 1.0000 | ORAL_TABLET | Freq: Three times a day (TID) | ORAL | 0 refills | Status: DC | PRN

## 2023-06-14 NOTE — Progress Notes (Signed)
Subjective:    Patient ID: Marie Church, female    DOB: 1955/08/04, 68 y.o.   MRN: [id number]  HPI   HPI  Marie Church is a 68 y.o. year old female  who  has a past medical history of Anemia, Anxiety, Arthritis, Breast cancer (HCC), Cataract, CHF (congestive heart failure) (HCC), DM type 2 (diabetes mellitus, type 2) (HCC), and Hypertension.   They are presenting to PM&R clinic as a new patient for pain management evaluation. They were referred by Dr. August Saucer for treatment of b/l Knee OA  pain.  Patient has had pain in her knees bilaterally for over 10 years.  Pain is worse in her right knee than her left knee.  Denies any history of trauma to her knees or surgery to her knees.  Standing and walking worsens her pain.  She had a right knee injection, was on by Dr. August Saucer 12/28/2022 for severe knee OA.  Dr. August Saucer discussed TKA however patient not interested at this time.  She previously had chemotherapy and x-ray treatment for breast cancer.   Red flag symptoms: No red flags for back pain endorsed in Hx or ROS  Medications tried:  Topical medications Voltaren doesn't help her- made it hurt worse Nsaids Ibuprofen minimal benefit  Tylenol  Helps slightly  Opiates  Tramadol helped her move around a little better Gabapentin / Lyrica  - Denies  TCAs  - Denies  SNRIs  Duloxetine - denies  Other  - denies   Other treatments: PT- for her knees was helpful a few years ago  TENs unit - denies  Injections Cortisone knee injection  Surgery denies    Interval History 04/05/23 Patient is here for follow-up regarding her severe chronic knee pain.  Pain continues to be poorly controlled and makes it very difficult for her to ambulate.  Pain continues to be worse over the right knee compared to her left knee.  Tramadol is no longer helping her pain and keep it under control.  She reports last cortisone injection she had in her right knee helped for a few weeks.  Interval History  04/22/2023 Patient is here for bilateral knee injection with Zilretta.  She reports she has not had any knee injection in over 3 months.  She reports that Norco 5 has been helping keep her pain at a more tolerable level.  Denies any side effects with this medication.  Denies any new significant changes in her medical history.   Interval History 06/14/23 Pt reports zilretta injections last visit was very beneficial. Injections are starting to wear off.  She continues to use Norco 5 with benefit, uses it only when pain is severe.  Last Rx lasted over 2 months.  No side effects with the medication.   Pain Inventory Average Pain 9 Pain Right Now 7 My pain is intermittent and dull  In the last 24 hours, has pain interfered with the following? General activity 7 Relation with others 9 Enjoyment of life 7 What TIME of day is your pain at its worst? morning , daytime, evening, and night Sleep (in general) Good  Pain is worse with: walking, standing, and some activites Pain improves with: medication and injections Relief from Meds: 9       Family History  Problem Relation Age of Onset   Heart failure Neg Hx    Heart disease Neg Hx    Heart attack Neg Hx    Colon polyps Neg Hx    Colon  cancer Neg Hx    Esophageal cancer Neg Hx    Stomach cancer Neg Hx    Rectal cancer Neg Hx    Social History   Socioeconomic History   Marital status: Widowed    Spouse name: Not on file   Number of children: Not on file   Years of education: Not on file   Highest education level: Not on file  Occupational History   Not on file  Tobacco Use   Smoking status: Former    Current packs/day: 0.00    Types: Cigarettes    Start date: 77    Quit date: 2000    Years since quitting: 25.0   Smokeless tobacco: Never  Vaping Use   Vaping status: Never Used  Substance and Sexual Activity   Alcohol use: Not Currently   Drug use: Never   Sexual activity: Not on file  Other Topics Concern   Not on  file  Social History Narrative   Not on file   Social Drivers of Health   Financial Resource Strain: Low Risk  (02/27/2022)   [facility], [facility], [facility]   Overall Financial Resource Strain (CARDIA)    Difficulty of Paying Living Expenses: Not hard at all  Food Insecurity: No Food Insecurity (07/30/2022)   Hunger Vital Sign    Worried About Running Out of Food in the Last Year: Never true    Ran Out of Food in the Last Year: Never true  Transportation Needs: No Transportation Needs (07/30/2022)   PRAPARE - Administrator, Civil Service (Medical): No    Lack of Transportation (Non-Medical): No  Physical Activity: Not on file  Stress: Stress Concern Present (02/04/2021)   [employer] of Occupational Health - Occupational Stress Questionnaire    Feeling of Stress : To some extent  Social Connections: Unknown (07/14/2022)   Received from [facility], [facility]   Social Network    Social Network: Not on file   Past Surgical History:  Procedure Laterality Date   BREAST LUMPECTOMY Right 11/30/2020   BRONCHIAL BIOPSY  04/09/2021   Procedure: BRONCHIAL BIOPSIES;  Surgeon: Omar Person, MD;  Location: [facility];  Service: Pulmonary;;   BRONCHIAL NEEDLE ASPIRATION BIOPSY  04/09/2021   Procedure: BRONCHIAL NEEDLE ASPIRATION BIOPSIES;  Surgeon: Omar Person, MD;  Location: [facility];  Service: Pulmonary;;   BRONCHIAL WASHINGS  04/09/2021   Procedure: BRONCHIAL WASHINGS;  Surgeon: Omar Person, MD;  Location: [facility];  Service: Pulmonary;;   ENDOBRONCHIAL ULTRASOUND N/A 04/09/2021   Procedure: ENDOBRONCHIAL ULTRASOUND;  Surgeon: Omar Person, MD;  Location: [facility];  Service: Pulmonary;  Laterality: N/A;   HEMOSTASIS CONTROL  04/09/2021   Procedure: HEMOSTASIS CONTROL;  Surgeon: Omar Person, MD;  Location: [facility];  Service: Pulmonary;;   IR REMOVAL  TUN ACCESS W/ PORT W/O FL MOD SED  07/30/2022   RIGHT/LEFT HEART CATH AND CORONARY ANGIOGRAPHY N/A 10/10/2020   Procedure: RIGHT/LEFT HEART CATH AND CORONARY ANGIOGRAPHY;  Surgeon: Dolores Patty, MD;  Location: [facility];  Service: Cardiovascular;  Laterality: N/A;   VIDEO BRONCHOSCOPY N/A 04/09/2021   Procedure: VIDEO BRONCHOSCOPY WITH FLUORO;  Surgeon: Omar Person, MD;  Location: [facility];  Service: Pulmonary;  Laterality: N/A;   Past Medical History:  Diagnosis Date   Anemia    Anxiety    Arthritis    Breast cancer (HCC)  Cataract    CHF (congestive heart failure) (HCC)    Pt states issues is resolved   DM type 2 (diabetes mellitus, type 2) (HCC)    reportedly steroid induced   Hypertension    Ht 5\' 6"  (1.676 m)   Wt 196 lb (88.9 kg)   BMI 31.64 kg/m   Opioid Risk Score:   Fall Risk Score:  `1  Depression screen [facility] 2/9     06/14/2023   10:09 AM 04/29/2023    9:32 AM 04/22/2023    2:31 PM 04/05/2023    3:25 PM 03/05/2023    9:34 AM 12/21/2022    2:16 PM 08/20/2022    2:47 PM  Depression screen PHQ 2/9  Decreased Interest 0 0 0 0 2 1 1   Down, Depressed, Hopeless 0 0 0 0 0 0 1  PHQ - 2 Score 0 0 0 0 2 1 2   Altered sleeping  0   0 0 0  Tired, decreased energy  3   3 2 3   Change in appetite  0   0 0 0  Feeling bad or failure about yourself   0   0 0 1  Trouble concentrating  0   0 0 1  Moving slowly or fidgety/restless  0   0 0 1  Suicidal thoughts  0   0 0 0  PHQ-9 Score  3   5 3 8   Difficult doing work/chores  Extremely dIfficult         Review of Systems  Constitutional: Negative.   HENT: Negative.    Eyes: Negative.   Respiratory: Negative.    Cardiovascular: Negative.   Gastrointestinal: Negative.   Endocrine: Negative.   Genitourinary: Negative.   Musculoskeletal:  Positive for gait problem.       Pain in both knees  Skin: Negative.   Allergic/Immunologic: Negative.   Hematological: Negative.   Psychiatric/Behavioral:  Negative.    All other systems reviewed and are negative.      Objective:   Physical Exam   Gen: no distress, normal appearing HEENT: oral mucosa pink and moist, NCAT Chest: normal effort, normal rate of breathing Abd: soft, non-distended Ext: no edema Psych: pleasant, normal affect Skin: intact Neuro: alert and awake, follows commands RUE: 5/5 Deltoid, 5/5 Biceps, 5/5 Triceps, 5/5 5/5 Grip LUE: 5/5 Deltoid, 5/5 Biceps, 5/5 Triceps, 5/5 5/5 Grip RLE: HF 5/5, KE 5/5, ADF 5/5, APF 5/5 LLE: HF 5/5, KE 5/5, ADF 5/5, APF 5/5 Sensory exam normal for light touch and pain in all 4 limbs.   Musculoskeletal:  R knee tenderness > L knee tenderness  + b/l knee crepitus Antalgic gait  Not waring knee brace today       Assessment & Plan:   1) Chronic b/l Knee pain with b/l Knee OA Right greater than Left  2) Hx of breast cancer   -Discussed PT, She reports limited by transportation/schedule -Zynex, Knee brace ordered prior visit-reviewed how to put this on with patient today -Hold off on TENS due to prior cancer Hx -UDS and pain agreement today -Tramadol 50mg  TID PRN-was discontinued prior visit -Continue Norco 5-325 TID PRN, ordered #90 -Continue UDS and pill counts.  Continue PDMP monitoring.  Pain contract completed prior visit. -Discussed bringing pill bottle with any medications even if empty to all appointments -Consider Genicular nerve block  -Schedule for repeat zilretta injections 3 months from last injection

## 2023-06-18 ENCOUNTER — Encounter: Payer: Self-pay | Admitting: Oncology

## 2023-06-18 ENCOUNTER — Inpatient Hospital Stay: Payer: 59 | Attending: Oncology | Admitting: Oncology

## 2023-06-18 ENCOUNTER — Inpatient Hospital Stay: Payer: 59

## 2023-06-18 VITALS — BP 133/75 | HR 94 | Temp 98.2°F | Resp 17 | Ht 66.0 in | Wt 200.0 lb

## 2023-06-18 DIAGNOSIS — Z853 Personal history of malignant neoplasm of breast: Secondary | ICD-10-CM | POA: Insufficient documentation

## 2023-06-18 DIAGNOSIS — R778 Other specified abnormalities of plasma proteins: Secondary | ICD-10-CM | POA: Diagnosis not present

## 2023-06-18 DIAGNOSIS — D649 Anemia, unspecified: Secondary | ICD-10-CM | POA: Diagnosis not present

## 2023-06-18 DIAGNOSIS — C50911 Malignant neoplasm of unspecified site of right female breast: Secondary | ICD-10-CM

## 2023-06-18 DIAGNOSIS — Z0000 Encounter for general adult medical examination without abnormal findings: Secondary | ICD-10-CM | POA: Insufficient documentation

## 2023-06-18 DIAGNOSIS — Z87891 Personal history of nicotine dependence: Secondary | ICD-10-CM | POA: Diagnosis not present

## 2023-06-18 DIAGNOSIS — Z9221 Personal history of antineoplastic chemotherapy: Secondary | ICD-10-CM | POA: Diagnosis not present

## 2023-06-18 LAB — COMPREHENSIVE METABOLIC PANEL
ALT: 12 U/L (ref 0–44)
AST: 22 U/L (ref 15–41)
Albumin: 4.1 g/dL (ref 3.5–5.0)
Alkaline Phosphatase: 80 U/L (ref 38–126)
Anion gap: 4 — ABNORMAL LOW (ref 5–15)
BUN: 11 mg/dL (ref 8–23)
CO2: 30 mmol/L (ref 22–32)
Calcium: 9.4 mg/dL (ref 8.9–10.3)
Chloride: 106 mmol/L (ref 98–111)
Creatinine, Ser: 0.9 mg/dL (ref 0.44–1.00)
GFR, Estimated: >60 mL/min (ref >60)
Glucose, Bld: 98 mg/dL (ref 70–99)
Potassium: 3.8 mmol/L (ref 3.5–5.1)
Sodium: 140 mmol/L (ref 135–145)
Total Bilirubin: 0.4 mg/dL (ref 0.0–1.2)
Total Protein: 9.2 g/dL — ABNORMAL HIGH (ref 6.5–8.1)

## 2023-06-18 LAB — CBC WITH DIFFERENTIAL/PLATELET
Abs Immature Granulocytes: 0.05 10*3/uL (ref 0.00–0.07)
Basophils Absolute: 0 10*3/uL (ref 0.0–0.1)
Basophils Relative: 0 %
Eosinophils Absolute: 0 10*3/uL (ref 0.0–0.5)
Eosinophils Relative: 1 %
HCT: 32.8 % — ABNORMAL LOW (ref 36.0–46.0)
Hemoglobin: 10.2 g/dL — ABNORMAL LOW (ref 12.0–15.0)
Immature Granulocytes: 2 %
Lymphocytes Relative: 47 %
Lymphs Abs: 1.6 10*3/uL (ref 0.7–4.0)
MCH: 29.4 pg (ref 26.0–34.0)
MCHC: 31.1 g/dL (ref 30.0–36.0)
MCV: 94.5 fL (ref 80.0–100.0)
Monocytes Absolute: 0.2 10*3/uL (ref 0.1–1.0)
Monocytes Relative: 5 %
Neutro Abs: 1.5 10*3/uL — ABNORMAL LOW (ref 1.7–7.7)
Neutrophils Relative %: 45 %
Platelets: 160 10*3/uL (ref 150–400)
RBC: 3.47 MIL/uL — ABNORMAL LOW (ref 3.87–5.11)
RDW: 16.1 % — ABNORMAL HIGH (ref 11.5–15.5)
WBC: 3.4 10*3/uL — ABNORMAL LOW (ref 4.0–10.5)
nRBC: 0 % (ref 0.0–0.2)

## 2023-06-18 LAB — LACTATE DEHYDROGENASE: LDH: 133 U/L (ref 98–192)

## 2023-06-18 LAB — IRON AND IRON BINDING CAPACITY (CC-WL,HP ONLY)
Iron: 88 ug/dL (ref 28–170)
Saturation Ratios: 28 % (ref 10.4–31.8)
TIBC: 316 ug/dL (ref 250–450)
UIBC: 228 ug/dL (ref 148–442)

## 2023-06-18 LAB — FERRITIN: Ferritin: 130 ng/mL (ref 11–307)

## 2023-06-18 LAB — FOLATE: Folate: 21.2 ng/mL (ref >5.9)

## 2023-06-18 LAB — VITAMIN B12: Vitamin B-12: 500 pg/mL (ref 180–914)

## 2023-06-18 NOTE — Assessment & Plan Note (Signed)
Chronic, slightly elevated total protein of uncertain significance.  Total protein was 9.2 g/dL today.  Clinical picture is not consistent with monoclonal gammopathy at this time given normal renal function and normal calcium.  We did obtain SPEP, IFE, quantitative immunoglobulins, serum free light chains for further evaluation today.  Will follow-up on the results and determine further course of action.

## 2023-06-18 NOTE — Assessment & Plan Note (Signed)
Diagnosed in November 2021 and managed at [place], [place].  ER negative, PR negative, HER2 positive.  Fortunately plan was to treat her with [facility] regimen every 3 weeks for 6 cycles as part of neoadjuvant treatment.     C1 HP only due to poor PS. 05/16/20 added weekly Taxol. 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 Recent mammogram was normal. Discussed annual mammograms and monitoring for changes or new symptoms. HER2 positive status has a slightly higher recurrence risk  - Continue annual mammograms  - Monitor for changes in the breast or new symptoms  - Schedule follow-up visit in three months

## 2023-06-18 NOTE — Assessment & Plan Note (Addendum)
Patient is up-to-date on screening mammogram.  Last mammogram on 06/15/2023 showed BI-RADS 2, benign findings.  Patient never had screening colonoscopy.  Strongly encouraged her to consider screening colonoscopy.  Patient apparently missed phone call visit from GI department.  She was advised to discuss with her PCP for arranging the same.

## 2023-06-18 NOTE — Progress Notes (Signed)
[facility]  HEMATOLOGY CLINIC CONSULTATION NOTE   PATIENT NAME: Marie Church   MR#: [id number] DOB: 12/21/1955  DATE OF SERVICE: 06/18/2023   REFERRING PHYSICIAN  Claiborne Rigg, NP   Patient Care Team: Claiborne Rigg, NP as PCP - General (Nurse Practitioner) Thurmon Fair, MD as PCP - Cardiology (Cardiology)   REASON FOR CONSULTATION/ CHIEF COMPLAINT:  Elevated total protein and anemia  ASSESSMENT & PLAN:  Marie Church is a 68 y.o. lady with a past medical history of stage IIb right-sided breast cancer, ER negative, PR negative, HER2/neu positive, diagnosed in November 2021 and treated at [place], [place], hypertension, diabetes mellitus type 2, chronic thrombocytopenia, CHF from chemotherapy, which resolved, was referred to our service for evaluation of anemia and elevated total protein.    Normocytic anemia Chronic anemia with low hemoglobin levels. Hemoglobin was 11.2 in December 2024. Chemotherapy for breast cancer may have contributed. No current iron supplementation.   Labs today revealed persistent anemia with hemoglobin of 10.2, hematocrit 32.8, MCV 94.5.  White count 3400 with normal differential, ANC of 1500.  Platelet count of 160,000.  CMP unremarkable except for total protein of 9.2 g/dL.  Iron studies, LDH are within normal limits.  To rule out other etiologies, will check vitamin B12, folate, serum copper, myeloma labs today including SPEP, IFE, quantitative immunoglobulins, serum free light chains.  No hematological intervention is warranted at current levels.  - Arrange follow-up phone call in two weeks to discuss blood work results  Plan to see her again in 3 months with repeat labs.  Elevated total protein Chronic, slightly elevated total protein of uncertain significance.  Total protein was 9.2 g/dL today.  Clinical picture is not consistent with monoclonal gammopathy at this time given normal renal function and normal  calcium.  We did obtain SPEP, IFE, quantitative immunoglobulins, serum free light chains for further evaluation today.  Will follow-up on the results and determine further course of action.  Breast cancer metastasized to axillary lymph node, right [facility]) Diagnosed in November 2021 and managed at [place], [place].  ER negative, PR negative, HER2 positive.  Fortunately plan was to treat her with [facility] regimen every 3 weeks for 6 cycles as part of neoadjuvant treatment.     C1 HP only due to poor PS. 05/16/20 added weekly Taxol. (THP)  She had Herceptin 5/13 and then was admitted with CHF [facility]) prior to her next treatment   She ended up having lumpectomy 11/2020 - 6 foci of IDC, largest 4mm but did have LVI and 2/18 nodes  12/2020 re-excision of positive margin   Completed 5 cycles Kadcyla 06/20/2021 - stopped due to low PLT   Currently in remission with no evidence of disease. Recent mammogram was normal. Discussed annual mammograms and monitoring for changes or new symptoms. HER2 positive status has a slightly higher recurrence risk  - Continue annual mammograms  - Monitor for changes in the breast or new symptoms  - Schedule follow-up visit in three months  Healthcare maintenance Patient is up-to-date on screening mammogram.  Last mammogram on 06/15/2023 showed BI-RADS 2, benign findings.  Patient never had screening colonoscopy.  Strongly encouraged her to consider screening colonoscopy.  Patient apparently missed phone call visit from GI department.  She was advised to discuss with her PCP for arranging the same.   I reviewed lab results and outside records for this visit and discussed relevant results with the patient. Diagnosis, plan of care and treatment  options were also discussed in detail with the patient. Opportunity provided to ask questions and answers provided to her apparent satisfaction. Provided instructions to call our clinic with any problems, questions or concerns  prior to return visit. I recommended to continue follow-up with PCP and sub-specialists. She verbalized understanding and agreed with the plan. No barriers to learning was detected.  Meryl Crutch, MD  06/18/2023 7:06 PM  [facility] - A DEPT OF [facility] [address] [place] [address] Dept: [phone] Dept Fax: [phone]   HISTORY OF PRESENT ILLNESS:  Discussed the use of AI scribe software for clinical note transcription with the patient, who gave verbal consent to proceed.   She has had chronic anemia and previously thrombocytopenia, presumed to be from chemotherapy.  She was also found to have elevated total protein which was 9.8 in October 2024 and 9.4 in December 2024.  Hence a referral was sent was for further evaluation of anemia and elevated total protein.  The patient is not currently on any medication for her breast cancer and is under observation. The patient underwent a lumpectomy for her right breast and had 18 lymph nodes removed, one of which was cancerous. The patient completed chemotherapy and radiation therapy nearly two years ago. The patient also reports having arthritis and is due for a knee replacement.  She denies fever, cough, diarrhea, or other infectious symptoms.  She denies epistaxis, bloody stool, melena, hematuria, bruising or other bleeding symptoms. She also denies unintentional weight loss, night sweats or other constitutional symptoms.  MEDICAL HISTORY Past Medical History:  Diagnosis Date   Anemia    Anxiety    Arthritis    Bacteremia 07/31/2022   Breast cancer (HCC)    Cataract    CHF (congestive heart failure) (HCC)    Pt states issues is resolved   DM type 2 (diabetes mellitus, type 2) (HCC)    reportedly steroid induced   Hypertension    Port or reservoir infection 07/30/2022   Streptococcal bacteremia 07/30/2022   Thrombocytopenia (HCC) 03/30/2021     SURGICAL  HISTORY Past Surgical History:  Procedure Laterality Date   BREAST LUMPECTOMY Right 11/30/2020   BRONCHIAL BIOPSY  04/09/2021   Procedure: BRONCHIAL BIOPSIES;  Surgeon: Omar Person, MD;  Location: [facility];  Service: Pulmonary;;   BRONCHIAL NEEDLE ASPIRATION BIOPSY  04/09/2021   Procedure: BRONCHIAL NEEDLE ASPIRATION BIOPSIES;  Surgeon: Omar Person, MD;  Location: [facility];  Service: Pulmonary;;   BRONCHIAL WASHINGS  04/09/2021   Procedure: BRONCHIAL WASHINGS;  Surgeon: Omar Person, MD;  Location: [facility];  Service: Pulmonary;;   ENDOBRONCHIAL ULTRASOUND N/A 04/09/2021   Procedure: ENDOBRONCHIAL ULTRASOUND;  Surgeon: Omar Person, MD;  Location: [facility];  Service: Pulmonary;  Laterality: N/A;   HEMOSTASIS CONTROL  04/09/2021   Procedure: HEMOSTASIS CONTROL;  Surgeon: Omar Person, MD;  Location: [facility];  Service: Pulmonary;;   IR REMOVAL TUN ACCESS W/ PORT W/O FL MOD SED  07/30/2022   RIGHT/LEFT HEART CATH AND CORONARY ANGIOGRAPHY N/A 10/10/2020   Procedure: RIGHT/LEFT HEART CATH AND CORONARY ANGIOGRAPHY;  Surgeon: Dolores Patty, MD;  Location[facility];  Service: Cardiovascular;  Laterality: N/A;   VIDEO BRONCHOSCOPY N/A 04/09/2021   Procedure: VIDEO BRONCHOSCOPY WITH FLUORO;  Surgeon: Omar Person, MD;  Location: [facility];  Service: Pulmonary;  Laterality: N/A;     SOCIAL HISTORY: She reports that she  quit smoking about 25 years ago. Her smoking use included cigarettes. She started smoking about 35 years ago. She has never used smokeless tobacco. She reports that she does not currently use alcohol. She reports that she does not use drugs. Social History   Socioeconomic History   Marital status: Widowed    Spouse name: Not on file   Number of children: Not on file   Years of education: Not on file   Highest education level: Not on file  Occupational History   Not on file  Tobacco Use   Smoking status:  Former    Current packs/day: 0.00    Types: Cigarettes    Start date: 39    Quit date: 2000    Years since quitting: 25.1   Smokeless tobacco: Never  Vaping Use   Vaping status: Never Used  Substance and Sexual Activity   Alcohol use: Not Currently   Drug use: Never   Sexual activity: Not on file  Other Topics Concern   Not on file  Social History Narrative   Not on file   Social Drivers of Health   Financial Resource Strain: Low Risk  (02/27/2022)   [facility] [place], [name], [facility], [facility]   Overall Financial Resource Strain (CARDIA)    Difficulty of Paying Living Expenses: Not hard at all  Food Insecurity: No Food Insecurity (07/30/2022)   Hunger Vital Sign    Worried About Running Out of Food in the Last Year: Never true    Ran Out of Food in the Last Year: Never true  Transportation Needs: No Transportation Needs (07/30/2022)   PRAPARE - Administrator, Civil Service (Medical): No    Lack of Transportation (Non-Medical): No  Physical Activity: Not on file  Stress: Stress Concern Present (02/04/2021)   [employer] of Occupational Health - Occupational Stress Questionnaire    Feeling of Stress : To some extent  Social Connections: Unknown (07/14/2022)   Received from [facility], [facility]   Social Network    Social Network: Not on file  Intimate Partner Violence: Not At Risk (07/30/2022)   Humiliation, Afraid, Rape, and Kick questionnaire    Fear of Current or Ex-Partner: No    Emotionally Abused: No    Physically Abused: No    Sexually Abused: No    FAMILY HISTORY: Her family history is not on file.  CURRENT MEDICATIONS   Current Outpatient Medications  Medication Instructions   carvedilol (COREG) 12.5 mg, Oral, 2 times daily with meals   famotidine (PEPCID) 40 mg, Oral, Daily   Farxiga 10 mg, Oral, Daily   HYDROcodone-acetaminophen (NORCO/VICODIN) 5-325 MG tablet 1 tablet,  Oral, Every 8 hours PRN   ofloxacin (OCUFLOX) 0.3 % ophthalmic solution 1 drop, 4 times daily   prednisoLONE acetate (PRED FORTE) 1 % ophthalmic suspension    sacubitril-valsartan (ENTRESTO) 24-26 MG 1 tablet, Oral, 2 times daily   spironolactone (ALDACTONE) 25 mg, Oral, Daily     ALLERGIES  She is allergic to doxycycline, amoxicillin-pot clavulanate, and lisinopril.  REVIEW OF SYSTEMS:  Review of Systems - Oncology   Rest of the pertinent review of systems is unremarkable except as mentioned above in HPI.  PHYSICAL EXAMINATION:    Onc Performance Status - 06/18/23 1400       ECOG Perf Status   ECOG Perf Status Ambulatory and capable of all selfcare but unable to carry out any work activities.  Up  and about more than 50% of waking hours      KPS SCALE   KPS % SCORE Cares for self, unable to carry on normal activity or to do active work             Vitals:   06/18/23 1417 06/18/23 1418  BP: (!) 157/87 133/75  Pulse: 94   Resp: 17   Temp: 98.2 F (36.8 C)   SpO2: 100%    Filed Weights   06/18/23 1417  Weight: 200 lb (90.7 kg)    Physical Exam Constitutional:      General: She is not in acute distress.    Appearance: Normal appearance.  HENT:     Head: Normocephalic and atraumatic.  Eyes:     General: No scleral icterus.    Conjunctiva/sclera: Conjunctivae normal.  Cardiovascular:     Rate and Rhythm: Normal rate and regular rhythm.     Heart sounds: Normal heart sounds.  Pulmonary:     Effort: Pulmonary effort is normal.     Breath sounds: Normal breath sounds.  Abdominal:     General: There is no distension.  Musculoskeletal:     Right lower leg: No edema.     Left lower leg: No edema.  Lymphadenopathy:     Cervical: No cervical adenopathy.  Neurological:     General: No focal deficit present.     Mental Status: She is alert and oriented to person, place, and time.  Psychiatric:        Mood and Affect: Mood normal.        Behavior: Behavior  normal.        Thought Content: Thought content normal.     LABORATORY DATA:   I have reviewed the data as listed.  Results for orders placed or performed in visit on 06/18/23  Vitamin B12  Result Value Ref Range   Vitamin B-12 500 180 - 914 pg/mL  Lactate dehydrogenase  Result Value Ref Range   LDH 133 98 - 192 U/L  Ferritin  Result Value Ref Range   Ferritin 130 11 - 307 ng/mL  Iron and Iron Binding Capacity ([facility] only)  Result Value Ref Range   Iron 88 28 - 170 ug/dL   TIBC 161 096 - 045 ug/dL   Saturation Ratios 28 10.4 - 31.8 %   UIBC 228 148 - 442 ug/dL  Comprehensive metabolic panel  Result Value Ref Range   Sodium 140 135 - 145 mmol/L   Potassium 3.8 3.5 - 5.1 mmol/L   Chloride 106 98 - 111 mmol/L   CO2 30 22 - 32 mmol/L   Glucose, Bld 98 70 - 99 mg/dL   BUN 11 8 - 23 mg/dL   Creatinine, Ser 4.09 0.44 - 1.00 mg/dL   Calcium 9.4 8.9 - 81.1 mg/dL   Total Protein 9.2 (H) 6.5 - 8.1 g/dL   Albumin 4.1 3.5 - 5.0 g/dL   AST 22 15 - 41 U/L   ALT 12 0 - 44 U/L   Alkaline Phosphatase 80 38 - 126 U/L   Total Bilirubin 0.4 0.0 - 1.2 mg/dL   GFR, Estimated >91 >47 mL/min   Anion gap 4 (L) 5 - 15  CBC with Differential/Platelet  Result Value Ref Range   WBC 3.4 (L) 4.0 - 10.5 K/uL   RBC 3.47 (L) 3.87 - 5.11 MIL/uL   Hemoglobin 10.2 (L) 12.0 - 15.0 g/dL   HCT 82.9 (L) 56.2 - 13.0 %   MCV  94.5 80.0 - 100.0 fL   MCH 29.4 26.0 - 34.0 pg   MCHC 31.1 30.0 - 36.0 g/dL   RDW 40.9 (H) 81.1 - 91.4 %   Platelets 160 150 - 400 K/uL   nRBC 0.0 0.0 - 0.2 %   Neutrophils Relative % 45 %   Neutro Abs 1.5 (L) 1.7 - 7.7 K/uL   Lymphocytes Relative 47 %   Lymphs Abs 1.6 0.7 - 4.0 K/uL   Monocytes Relative 5 %   Monocytes Absolute 0.2 0.1 - 1.0 K/uL   Eosinophils Relative 1 %   Eosinophils Absolute 0.0 0.0 - 0.5 K/uL   Basophils Relative 0 %   Basophils Absolute 0.0 0.0 - 0.1 K/uL   Immature Granulocytes 2 %   Abs Immature Granulocytes 0.05 0.00 - 0.07 K/uL      RADIOGRAPHIC STUDIES:  I have personally reviewed the radiological images as listed and agreed with the findings in the report.  MM 3D SCREENING MAMMOGRAM BILATERAL BREAST Result Date: 06/15/2023 CLINICAL DATA:  Screening. History of a right lumpectomy for breast carcinoma in July 2022. EXAM: DIGITAL SCREENING BILATERAL MAMMOGRAM WITH TOMOSYNTHESIS AND CAD TECHNIQUE: Bilateral screening digital craniocaudal and mediolateral oblique mammograms were obtained. Bilateral screening digital breast tomosynthesis was performed. The images were evaluated with computer-aided detection. COMPARISON:  02/24/2022, right breast diagnostic images, 06/25/2021 and 09/27/2020. ACR Breast Density Category b: There are scattered areas of fibroglandular density. FINDINGS: There are no findings suspicious for malignancy. IMPRESSION: No findings to suggest a new or recurrent breast carcinoma. Right breast is smaller with significantly less skin thickening and trabecular thickening than on the most prior exams. This is assumed to be due to resolution of post treatment related edema. Lumpectomy site in the posterior upper outer right breast is similar to the exam dated 02/24/2022. There are no suspicious masses, areas of nonsurgical architectural distortion or suspicious calcifications. Appearance of the left breast is stable. A result letter of this screening mammogram will be mailed directly to the patient. RECOMMENDATION: Screening mammogram in one year. (Code:SM-B-01Y) BI-RADS CATEGORY  2: Benign. Electronically Signed   By: Amie Portland M.D.   On: 06/15/2023 15:49    Orders Placed This Encounter  Procedures   CBC with Differential/Platelet    Standing Status:   Future    Number of Occurrences:   1    Expiration Date:   06/17/2024   Comprehensive metabolic panel    Standing Status:   Future    Number of Occurrences:   1    Expiration Date:   06/17/2024   Iron and Iron Binding Capacity ([facility] only)    Standing  Status:   Future    Number of Occurrences:   1    Expiration Date:   06/17/2024   Ferritin    Standing Status:   Future    Number of Occurrences:   1    Expiration Date:   06/17/2024   Lactate dehydrogenase    Standing Status:   Future    Number of Occurrences:   1    Expiration Date:   06/17/2024   Kappa/lambda light chains    Standing Status:   Future    Number of Occurrences:   1    Expiration Date:   06/17/2024   Multiple Myeloma Panel (SPEP&IFE w/QIG)    Standing Status:   Future    Number of Occurrences:   1    Expiration Date:   06/17/2024   Copper, serum  Standing Status:   Future    Number of Occurrences:   1    Expiration Date:   06/17/2024   Folate    Standing Status:   Future    Number of Occurrences:   1    Expiration Date:   06/17/2024   Vitamin B12    Standing Status:   Future    Number of Occurrences:   1    Expiration Date:   06/17/2024    Future Appointments  Date Time Provider Department Center  06/25/2023 10:10 AM Claiborne Rigg, NP [facility] None  06/30/2023 10:30 AM Meryl Crutch, MD [facility] None  07/28/2023  4:10 PM Claiborne Rigg, NP [facility] None  08/03/2023 11:40 AM Fanny Dance, MD [facility] CPR  09/17/2023  2:45 PM [facility] LAB [facility] None  09/17/2023  3:20 PM Skylah Delauter, Archie Patten, MD [facility] None    I spent a total of 60 minutes during this encounter with the patient including review of chart and various tests results, discussions about plan of care and coordination of care plan.  This document was completed utilizing speech recognition software. Grammatical errors, random word insertions, pronoun errors, and incomplete sentences are an occasional consequence of this system due to software limitations, ambient noise, and hardware issues. Any formal questions or concerns about the content, text or information contained within the body of this dictation should be directly addressed to the provider for clarification.

## 2023-06-18 NOTE — Assessment & Plan Note (Addendum)
Chronic anemia with low hemoglobin levels. Hemoglobin was 11.2 in December 2024. Chemotherapy for breast cancer may have contributed. No current iron supplementation.   Labs today revealed persistent anemia with hemoglobin of 10.2, hematocrit 32.8, MCV 94.5.  White count 3400 with normal differential, ANC of 1500.  Platelet count of 160,000.  CMP unremarkable except for total protein of 9.2 g/dL.  Iron studies, LDH are within normal limits.  To rule out other etiologies, will check vitamin B12, folate, serum copper, myeloma labs today including SPEP, IFE, quantitative immunoglobulins, serum free light chains.  No hematological intervention is warranted at current levels.  - Arrange follow-up phone call in two weeks to discuss blood work results  Plan to see her again in 3 months with repeat labs.

## 2023-06-20 LAB — KAPPA/LAMBDA LIGHT CHAINS
Kappa free light chain: 118.8 mg/L — ABNORMAL HIGH (ref 3.3–19.4)
Kappa, lambda light chain ratio: 3.54 — ABNORMAL HIGH (ref 0.26–1.65)
Lambda free light chains: 33.6 mg/L — ABNORMAL HIGH (ref 5.7–26.3)

## 2023-06-22 LAB — COPPER, SERUM: Copper: 96 ug/dL (ref 80–158)

## 2023-06-23 LAB — MULTIPLE MYELOMA PANEL, SERUM
Albumin SerPl Elph-Mcnc: 3.8 g/dL (ref 2.9–4.4)
Albumin/Glob SerPl: 0.9 (ref 0.7–1.7)
Alpha 1: 0.2 g/dL (ref 0.0–0.4)
Alpha2 Glob SerPl Elph-Mcnc: 0.7 g/dL (ref 0.4–1.0)
B-Globulin SerPl Elph-Mcnc: 1.2 g/dL (ref 0.7–1.3)
Gamma Glob SerPl Elph-Mcnc: 2.6 g/dL — ABNORMAL HIGH (ref 0.4–1.8)
Globulin, Total: 4.7 g/dL — ABNORMAL HIGH (ref 2.2–3.9)
IgA: 375 mg/dL — ABNORMAL HIGH (ref 87–352)
IgG (Immunoglobin G), Serum: 3368 mg/dL — ABNORMAL HIGH (ref 586–1602)
IgM (Immunoglobulin M), Srm: 245 mg/dL — ABNORMAL HIGH (ref 26–217)
Total Protein ELP: 8.5 g/dL (ref 6.0–8.5)

## 2023-06-24 DIAGNOSIS — H25813 Combined forms of age-related cataract, bilateral: Secondary | ICD-10-CM | POA: Diagnosis not present

## 2023-06-25 ENCOUNTER — Encounter: Payer: 59 | Admitting: Nurse Practitioner

## 2023-06-30 ENCOUNTER — Encounter: Payer: Self-pay | Admitting: Oncology

## 2023-06-30 ENCOUNTER — Inpatient Hospital Stay: Payer: 59 | Attending: Oncology | Admitting: Oncology

## 2023-06-30 ENCOUNTER — Telehealth: Payer: Self-pay | Admitting: Oncology

## 2023-06-30 DIAGNOSIS — C773 Secondary and unspecified malignant neoplasm of axilla and upper limb lymph nodes: Secondary | ICD-10-CM

## 2023-06-30 DIAGNOSIS — D649 Anemia, unspecified: Secondary | ICD-10-CM

## 2023-06-30 DIAGNOSIS — C50911 Malignant neoplasm of unspecified site of right female breast: Secondary | ICD-10-CM

## 2023-06-30 DIAGNOSIS — R778 Other specified abnormalities of plasma proteins: Secondary | ICD-10-CM

## 2023-06-30 NOTE — Assessment & Plan Note (Addendum)
Chronic, slightly elevated total protein of uncertain significance.    On her initial consultation with [place] on 06/18/2023, labs revealed total protein of 9.2 g/dL.  Creatinine was normal at 0.9, calcium normal at 9.4, albumin 4.1.  Hemoglobin was stable at 10.2.   SPEP showed no evidence of M spike.  IFE showed polyclonal increase detected in one or more immunoglobulins.  Serum free kappa was elevated at 118 mg/L, serum free lambda was elevated at 33.6 mg/L, ratio elevated at 3.54.  Quantitative IgG was increased at 3368 mg/dL, IgA was also slightly increased at 375 mg/dL and IgM was also increased at 245 mg/dL.  Overall picture is not consistent with monoclonal gammopathy but rather polyclonal increase in proteins from uncertain etiology.  Normal renal function and normal calcium.    No hematological intervention is warranted at current levels.  We will pursue additional workup with ESR, CRP, ANA, rheumatoid factor and repeat myeloma labs 2 weeks prior to return visit.

## 2023-06-30 NOTE — Assessment & Plan Note (Addendum)
Chronic anemia with low hemoglobin levels. Hemoglobin was 11.2 in December 2024. Chemotherapy for breast cancer may have contributed. No current iron supplementation.   On her initial consultation with [place] on 06/18/2023, labs revealed persistent anemia with hemoglobin of 10.2, hematocrit 32.8, MCV 94.5.  White count 3400 with normal differential, ANC of 1500.  Platelet count of 160,000.  CMP unremarkable except for total protein of 9.2 g/dL.  Creatinine was normal at 0.9, calcium normal at 9.4, albumin 4.1. Iron studies, ferritin, B12, folate, serum copper, LDH were within normal limits.   SPEP showed no evidence of M spike.  IFE showed polyclonal increase detected in one or more immunoglobulins.  Serum free kappa was elevated at 118 mg/L, serum free lambda was elevated at 33.6 mg/L, ratio elevated at 3.54.  Quantitative IgG was increased at 3368 mg/dL, IgA was also slightly increased at 375 mg/dL and IgM was also increased at 245 mg/dL.  Overall picture is not consistent with monoclonal gammopathy but rather polyclonal increase in proteins from uncertain etiology.  normal renal function and normal calcium.    No hematological intervention is warranted at current levels.  Plan to see her again in 3 months with repeat labs.

## 2023-06-30 NOTE — Assessment & Plan Note (Signed)
Diagnosed in November 2021 and managed at [place], [place].  ER negative, PR negative, HER2 positive.  Originally plan was to treat her with [facility] regimen every 3 weeks for 6 cycles as part of neoadjuvant treatment.     C1 HP only due to poor PS. 05/16/20 added weekly Taxol. (THP)  She had Herceptin 5/13 and then was admitted with CHF [facility]) prior to her next treatment   She ended up having lumpectomy 11/2020 - 6 foci of IDC, largest 4mm but did have LVI and 2/18 nodes  12/2020 re-excision of positive margin   Completed 5 cycles Kadcyla 06/20/2021 - stopped due to low PLT   Currently in remission with no evidence of disease. Recent mammogram was normal. Discussed annual mammograms and monitoring for changes or new symptoms. HER2 positive status has a slightly higher recurrence risk  - Continue annual mammograms  - Monitor for changes in the breast or new symptoms  - Schedule follow-up visit in three months

## 2023-06-30 NOTE — Progress Notes (Signed)
[facility]  HEMATOLOGY-ONCOLOGY ELECTRONIC VISIT PROGRESS NOTE  PATIENT NAME: Marie Church   MR#: [id number] DOB: October 16, 1955  DATE OF SERVICE: 06/30/2023  Patient Care Team: Claiborne Rigg, NP as PCP - General (Nurse Practitioner) Thurmon Fair, MD as PCP - Cardiology (Cardiology)  I connected with the patient via telephone conference and verified that I am speaking with the correct person using two identifiers. The patient's location is at home and I am providing care from the [facility].  I discussed the limitations, risks, security and privacy concerns of performing an evaluation and management service by e-visits and the availability of in person appointments.  I also discussed with the patient that there may be a patient responsible charge related to this service. The patient expressed understanding and agreed to proceed.   ASSESSMENT & PLAN:   Marie Church is a 68 y.o. lady with a past medical history of stage IIb right-sided breast cancer, ER negative, PR negative, HER2/neu positive, diagnosed in November 2021 and treated at [place], [place], hypertension, diabetes mellitus type 2, chronic thrombocytopenia, CHF from chemotherapy, which resolved, was referred to our service in January 2025 for evaluation of anemia and elevated total protein.    Normocytic anemia Chronic anemia with low hemoglobin levels. Hemoglobin was 11.2 in December 2024. Chemotherapy for breast cancer may have contributed. No current iron supplementation.   On her initial consultation with [place] on 06/18/2023, labs revealed persistent anemia with hemoglobin of 10.2, hematocrit 32.8, MCV 94.5.  White count 3400 with normal differential, ANC of 1500.  Platelet count of 160,000.  CMP unremarkable except for total protein of 9.2 g/dL.  Creatinine was normal at 0.9, calcium normal at 9.4, albumin 4.1. Iron studies, ferritin, B12, folate, serum copper, LDH were within normal limits.   SPEP  showed no evidence of M spike.  IFE showed polyclonal increase detected in one or more immunoglobulins.  Serum free kappa was elevated at 118 mg/L, serum free lambda was elevated at 33.6 mg/L, ratio elevated at 3.54.  Quantitative IgG was increased at 3368 mg/dL, IgA was also slightly increased at 375 mg/dL and IgM was also increased at 245 mg/dL.  Overall picture is not consistent with monoclonal gammopathy but rather polyclonal increase in proteins from uncertain etiology.  normal renal function and normal calcium.    No hematological intervention is warranted at current levels.  Plan to see her again in 3 months with repeat labs.  Elevated total protein Chronic, slightly elevated total protein of uncertain significance.    On her initial consultation with [place] on 06/18/2023, labs revealed total protein of 9.2 g/dL.  Creatinine was normal at 0.9, calcium normal at 9.4, albumin 4.1.  Hemoglobin was stable at 10.2.   SPEP showed no evidence of M spike.  IFE showed polyclonal increase detected in one or more immunoglobulins.  Serum free kappa was elevated at 118 mg/L, serum free lambda was elevated at 33.6 mg/L, ratio elevated at 3.54.  Quantitative IgG was increased at 3368 mg/dL, IgA was also slightly increased at 375 mg/dL and IgM was also increased at 245 mg/dL.  Overall picture is not consistent with monoclonal gammopathy but rather polyclonal increase in proteins from uncertain etiology.  Normal renal function and normal calcium.    No hematological intervention is warranted at current levels.  We will pursue additional workup with ESR, CRP, ANA, rheumatoid factor and repeat myeloma labs 2 weeks prior to return visit.  Breast cancer metastasized to axillary lymph  node, right [facility]) Diagnosed in November 2021 and managed at [place], [place].  ER negative, PR negative, HER2 positive.  Originally plan was to treat her with [facility] regimen every 3 weeks for 6 cycles as part of neoadjuvant treatment.      C1 HP only due to poor PS. 05/16/20 added weekly Taxol. (THP)  She had Herceptin 5/13 and then was admitted with CHF [facility]) prior to her next treatment   She ended up having lumpectomy 11/2020 - 6 foci of IDC, largest 4mm but did have LVI and 2/18 nodes  12/2020 re-excision of positive margin   Completed 5 cycles Kadcyla 06/20/2021 - stopped due to low PLT   Currently in remission with no evidence of disease. Recent mammogram was normal. Discussed annual mammograms and monitoring for changes or new symptoms. HER2 positive status has a slightly higher recurrence risk  - Continue annual mammograms  - Monitor for changes in the breast or new symptoms  - Schedule follow-up visit in three months   I discussed the assessment and treatment plan with the patient. The patient was provided an opportunity to ask questions and all were answered. The patient agreed with the plan and demonstrated an understanding of the instructions. The patient was advised to call back or seek an in-person evaluation if the symptoms worsen or if the condition fails to improve as anticipated.    I spent 12 minutes over the phone with the patient reviewing test results, discuss management and coordination/planning of care.  Meryl Crutch, MD 06/30/2023 11:26 AM [facility] - A DEPT [facility] [address] [place] [address] Dept: [phone] Dept Fax: [phone]   INTERVAL HISTORY:  Please see above for problem oriented charting.  The purpose of today's discussion is to explain recent lab results and to formulate plan of care.  SUMMARY OF HEMATOLOGY HISTORY:  She has had chronic anemia and previously thrombocytopenia, presumed to be from chemotherapy.  She was also found to have elevated total protein which was 9.8 in October 2024 and 9.4 in December 2024.  Hence a referral was sent was for further evaluation of anemia and  elevated total protein.   The patient is not currently on any medication for her breast cancer and is under observation. The patient underwent a lumpectomy for her right breast and had 18 lymph nodes removed, one of which was cancerous. The patient completed chemotherapy and radiation therapy nearly two years ago. The patient also reports having arthritis and is due for a knee replacement.   She denies fever, cough, diarrhea, or other infectious symptoms.  She denies epistaxis, bloody stool, melena, hematuria, bruising or other bleeding symptoms. She also denies unintentional weight loss, night sweats or other constitutional symptoms.  Chronic anemia with low hemoglobin levels. Hemoglobin was 11.2 in December 2024. Chemotherapy for breast cancer may have contributed. No current iron supplementation.    On her initial consultation with [place] on 06/18/2023, labs revealed persistent anemia with hemoglobin of 10.2, hematocrit 32.8, MCV 94.5.  White count 3400 with normal differential, ANC of 1500.  Platelet count of 160,000.  CMP unremarkable except for total protein of 9.2 g/dL.  Creatinine was normal at 0.9, calcium normal at 9.4, albumin 4.1. Iron studies, ferritin, B12, folate, serum copper, LDH were within normal limits.   SPEP showed no evidence of M spike.  IFE showed polyclonal increase detected in one or more immunoglobulins.  Serum free kappa  was elevated at 118 mg/L, serum free lambda was elevated at 33.6 mg/L, ratio elevated at 3.54.  Quantitative IgG was increased at 3368 mg/dL, IgA was also slightly increased at 375 mg/dL and IgM was also increased at 245 mg/dL.  Overall picture is not consistent with monoclonal gammopathy but rather polyclonal increase in proteins from uncertain etiology.  Normal renal function and normal calcium.    No hematological intervention is warranted at current levels.  We will pursue additional workup with ESR, CRP, ANA, rheumatoid factor and repeat myeloma labs 2  weeks prior to return visit.   Oncology History  Breast cancer metastasized to axillary lymph node, right (HCC)  11/30/2020 Initial Diagnosis   Breast cancer metastasized to axillary lymph node, right (HCC)   06/18/2023 Cancer Staging   Staging form: Breast, AJCC 8th Edition - Clinical: Stage IIB (cT2, cN1, cM0, G2, ER-, PR-, HER2+) - Signed by Meryl Crutch, MD on 06/18/2023 Method of lymph node assessment: Axillary lymph node dissection Histologic grading system: 3 grade system     REVIEW OF SYSTEMS:    Review of Systems - Oncology  All other pertinent systems were reviewed with the patient and are negative.  I have reviewed the past medical history, past surgical history, social history and family history with the patient and they are unchanged from previous note.  ALLERGIES:  She is allergic to doxycycline, amoxicillin-pot clavulanate, and lisinopril.  MEDICATIONS:  Current Outpatient Medications  Medication Sig Dispense Refill   carvedilol (COREG) 12.5 MG tablet Take 1 tablet (12.5 mg total) by mouth 2 (two) times daily with a meal. 180 tablet 0   famotidine (PEPCID) 40 MG tablet Take 1 tablet (40 mg total) by mouth daily. 90 tablet 3   FARXIGA 10 MG TABS tablet Take 1 tablet (10 mg total) by mouth daily. 90 tablet 0   HYDROcodone-acetaminophen (NORCO/VICODIN) 5-325 MG tablet Take 1 tablet by mouth every 8 (eight) hours as needed for moderate pain (pain score 4-6). 90 tablet 0   ofloxacin (OCUFLOX) 0.3 % ophthalmic solution Place 1 drop into the right eye 4 (four) times daily.     prednisoLONE acetate (PRED FORTE) 1 % ophthalmic suspension      sacubitril-valsartan (ENTRESTO) 24-26 MG Take 1 tablet by mouth 2 (two) times daily. 180 tablet 0   spironolactone (ALDACTONE) 25 MG tablet Take 1 tablet (25 mg total) by mouth daily. 90 tablet 0   No current facility-administered medications for this visit.    PHYSICAL EXAMINATION:    Onc Performance Status - 06/30/23 1100        ECOG Perf Status   ECOG Perf Status Ambulatory and capable of all selfcare but unable to carry out any work activities.  Up and about more than 50% of waking hours      KPS SCALE   KPS % SCORE Cares for self, unable to carry on normal activity or to do active work             LABORATORY DATA:   I have reviewed the data as listed.  Recent Results (from the past 2160 hours)  CMP14+EGFR     Status: Abnormal   Collection Time: 04/29/23 11:49 AM  Result Value Ref Range   Glucose 96 70 - 99 mg/dL   BUN 20 8 - 27 mg/dL   Creatinine, Ser 8.11 0.57 - 1.00 mg/dL   eGFR 79 >91 YN/WGN/5.62   BUN/Creatinine Ratio 24 12 - 28   Sodium 137 134 - 144 mmol/L  Potassium 4.5 3.5 - 5.2 mmol/L   Chloride 102 96 - 106 mmol/L   CO2 22 20 - 29 mmol/L   Calcium 9.5 8.7 - 10.3 mg/dL   Total Protein 9.4 (H) 6.0 - 8.5 g/dL   Albumin 4.4 3.9 - 4.9 g/dL   Globulin, Total 5.0 (H) 1.5 - 4.5 g/dL   Bilirubin Total 0.5 0.0 - 1.2 mg/dL   Alkaline Phosphatase 91 44 - 121 IU/L   AST 24 0 - 40 IU/L   ALT 40 (H) 0 - 32 IU/L  CBC with Differential/Platelet     Status: Abnormal   Collection Time: 04/29/23 11:49 AM  Result Value Ref Range   WBC 6.7 3.4 - 10.8 x10E3/uL   RBC 3.82 3.77 - 5.28 x10E6/uL    Comment: Target cells present.   Hemoglobin 11.2 11.1 - 15.9 g/dL   Hematocrit 19.1 47.8 - 46.6 %   MCV 91 79 - 97 fL   MCH 29.3 26.6 - 33.0 pg   MCHC 32.1 31.5 - 35.7 g/dL   RDW 29.5 (H) 62.1 - 30.8 %   Platelets 230 150 - 450 x10E3/uL   Neutrophils 47 Not Estab. %   Lymphs 49 Not Estab. %   Monocytes 3 Not Estab. %   Eos 0 Not Estab. %   Basos 0 Not Estab. %   Immature Cells Note    Neutrophils Absolute 3.1 1.4 - 7.0 x10E3/uL   Lymphocytes Absolute 3.3 (H) 0.7 - 3.1 x10E3/uL   Monocytes Absolute 0.2 0.1 - 0.9 x10E3/uL   EOS (ABSOLUTE) 0.0 0.0 - 0.4 x10E3/uL   Basophils Absolute 0.0 0.0 - 0.2 x10E3/uL   Hematology Comments: Note:     Comment: Manual differential was performed.  Immature Cells      Status: Abnormal   Collection Time: 04/29/23 11:49 AM  Result Value Ref Range   Metamyelocytes 1 (H) 0 - 0 %  Vitamin B12     Status: None   Collection Time: 06/18/23  2:50 PM  Result Value Ref Range   Vitamin B-12 500 180 - 914 pg/mL    Comment: (NOTE) This assay is not validated for testing neonatal or myeloproliferative syndrome specimens for Vitamin B12 levels. Performed at [facility], [address], [place], [place] [address]   Copper, serum     Status: None   Collection Time: 06/18/23  2:50 PM  Result Value Ref Range   Copper 96 80 - 158 ug/dL    Comment: (NOTE) This test was developed and its performance characteristics determined by [facility]. It has not been cleared or approved by the Food and Drug Administration.                                Detection Limit = 5 Performed At: [facility] [address] [place], [place] [id number] Jolene Schimke MD [id number]   Kappa/lambda light chains     Status: Abnormal   Collection Time: 06/18/23  2:50 PM  Result Value Ref Range   Kappa free light chain 118.8 (H) 3.3 - 19.4 mg/L   Lambda free light chains 33.6 (H) 5.7 - 26.3 mg/L   Kappa, lambda light chain ratio 3.54 (H) 0.26 - 1.65    Comment: (NOTE) Performed At: [facility] [address] [place], [place] [id number] Jolene Schimke MD QQ:[id number]   Iron and Iron Binding Capacity ([facility] only)     Status: None   Collection  Time: 06/18/23  2:50 PM  Result Value Ref Range   Iron 88 28 - 170 ug/dL   TIBC 161 096 - 045 ug/dL   Saturation Ratios 28 10.4 - 31.8 %   UIBC 228 148 - 442 ug/dL    Comment: Performed at [facility], [address] [place], [place] [address]  Comprehensive metabolic panel     Status: Abnormal   Collection Time: 06/18/23  2:50 PM  Result Value Ref Range   Sodium 140 135 - 145 mmol/L   Potassium 3.8 3.5 - 5.1 mmol/L   Chloride 106 98 - 111 mmol/L   CO2 30 22 - 32 mmol/L    Glucose, Bld 98 70 - 99 mg/dL    Comment: Glucose reference range applies only to samples taken after fasting for at least 8 hours.   BUN 11 8 - 23 mg/dL   Creatinine, Ser 1.91 0.44 - 1.00 mg/dL   Calcium 9.4 8.9 - 47.8 mg/dL   Total Protein 9.2 (H) 6.5 - 8.1 g/dL   Albumin 4.1 3.5 - 5.0 g/dL   AST 22 15 - 41 U/L   ALT 12 0 - 44 U/L   Alkaline Phosphatase 80 38 - 126 U/L   Total Bilirubin 0.4 0.0 - 1.2 mg/dL   GFR, Estimated >29 >56 mL/min    Comment: (NOTE) Calculated using the CKD-EPI Creatinine Equation (2021)    Anion gap 4 (L) 5 - 15    Comment: Performed at [facility], [address] [place], [place] [address]  CBC with Differential/Platelet     Status: Abnormal   Collection Time: 06/18/23  2:50 PM  Result Value Ref Range   WBC 3.4 (L) 4.0 - 10.5 K/uL   RBC 3.47 (L) 3.87 - 5.11 MIL/uL   Hemoglobin 10.2 (L) 12.0 - 15.0 g/dL   HCT 65.7 (L) 84.6 - 96.2 %   MCV 94.5 80.0 - 100.0 fL   MCH 29.4 26.0 - 34.0 pg   MCHC 31.1 30.0 - 36.0 g/dL   RDW 95.2 (H) 84.1 - 32.4 %   Platelets 160 150 - 400 K/uL   nRBC 0.0 0.0 - 0.2 %   Neutrophils Relative % 45 %   Neutro Abs 1.5 (L) 1.7 - 7.7 K/uL   Lymphocytes Relative 47 %   Lymphs Abs 1.6 0.7 - 4.0 K/uL   Monocytes Relative 5 %   Monocytes Absolute 0.2 0.1 - 1.0 K/uL   Eosinophils Relative 1 %   Eosinophils Absolute 0.0 0.0 - 0.5 K/uL   Basophils Relative 0 %   Basophils Absolute 0.0 0.0 - 0.1 K/uL   Immature Granulocytes 2 %   Abs Immature Granulocytes 0.05 0.00 - 0.07 K/uL    Comment: Performed at [facility], [address], [place], [place] [address]  Folate     Status: None   Collection Time: 06/18/23  2:51 PM  Result Value Ref Range   Folate 21.2 >5.9 ng/mL    Comment: Performed at [facility], [address], [place], [place] [address]  Multiple Myeloma Panel (SPEP&IFE w/QIG)     Status: Abnormal   Collection Time: 06/18/23  2:51 PM  Result Value  Ref Range   IgG (Immunoglobin G), Serum 3,368 (H) 586 - 1,602 mg/dL   IgA 644 (H) 87 - 034 mg/dL   IgM (Immunoglobulin M), Srm 245 (H) 26 - 217 mg/dL   Total Protein ELP 8.5 6.0 - 8.5  g/dL   Albumin SerPl Elph-Mcnc 3.8 2.9 - 4.4 g/dL   Alpha 1 0.2 0.0 - 0.4 g/dL   Alpha2 Glob SerPl Elph-Mcnc 0.7 0.4 - 1.0 g/dL   B-Globulin SerPl Elph-Mcnc 1.2 0.7 - 1.3 g/dL   Gamma Glob SerPl Elph-Mcnc 2.6 (H) 0.4 - 1.8 g/dL   M Protein SerPl Elph-Mcnc Not Observed Not Observed g/dL   Globulin, Total 4.7 (H) 2.2 - 3.9 g/dL   Albumin/Glob SerPl 0.9 0.7 - 1.7   IFE 1 Comment (A)     Comment: Polyclonal increase detected in one or more immunoglobulins.   Please Note Comment     Comment: (NOTE) Protein electrophoresis scan will follow via computer, mail, or courier delivery. Performed At: [facility] [address] [place], [place] [id number] Jolene Schimke MD WU:[id number]   Lactate dehydrogenase     Status: None   Collection Time: 06/18/23  2:51 PM  Result Value Ref Range   LDH 133 98 - 192 U/L    Comment: Performed at [facility], [address] [place], [place] [address]  Ferritin     Status: None   Collection Time: 06/18/23  2:51 PM  Result Value Ref Range   Ferritin 130 11 - 307 ng/mL    Comment: Performed at [facility], [address] [place], [place] [address]     RADIOGRAPHIC STUDIES:  I have personally reviewed the radiological images as listed and agree with the findings in the report.  MM 3D SCREENING MAMMOGRAM BILATERAL BREAST Result Date: 06/15/2023 CLINICAL DATA:  Screening. History of a right lumpectomy for breast carcinoma in July 2022. EXAM: DIGITAL SCREENING BILATERAL MAMMOGRAM WITH TOMOSYNTHESIS AND CAD TECHNIQUE: Bilateral screening digital craniocaudal and mediolateral oblique mammograms were obtained. Bilateral screening digital breast tomosynthesis was performed. The images were evaluated with computer-aided  detection. COMPARISON:  02/24/2022, right breast diagnostic images, 06/25/2021 and 09/27/2020. ACR Breast Density Category b: There are scattered areas of fibroglandular density. FINDINGS: There are no findings suspicious for malignancy. IMPRESSION: No findings to suggest a new or recurrent breast carcinoma. Right breast is smaller with significantly less skin thickening and trabecular thickening than on the most prior exams. This is assumed to be due to resolution of post treatment related edema. Lumpectomy site in the posterior upper outer right breast is similar to the exam dated 02/24/2022. There are no suspicious masses, areas of nonsurgical architectural distortion or suspicious calcifications. Appearance of the left breast is stable. A result letter of this screening mammogram will be mailed directly to the patient. RECOMMENDATION: Screening mammogram in one year. (Code:SM-B-01Y) BI-RADS CATEGORY  2: Benign. Electronically Signed   By: Amie Portland M.D.   On: 06/15/2023 15:49    Orders Placed This Encounter  Procedures   CBC with Differential (Cancer Center Only)    Standing Status:   Future    Expected Date:   09/03/2023    Expiration Date:   06/29/2024   CMP (Cancer Center only)    Standing Status:   Future    Expected Date:   09/03/2023    Expiration Date:   06/29/2024   Lactate dehydrogenase    Standing Status:   Future    Expected Date:   09/03/2023    Expiration Date:   06/29/2024   Sedimentation rate    Standing Status:   Future    Expected Date:   09/03/2023    Expiration Date:   06/29/2024   C-reactive protein    Standing Status:  Future    Expected Date:   09/03/2023    Expiration Date:   06/29/2024   Rheumatoid factor    Standing Status:   Future    Expected Date:   09/03/2023    Expiration Date:   06/29/2024   Multiple Myeloma Panel (SPEP&IFE w/QIG)    Standing Status:   Future    Expected Date:   09/03/2023    Expiration Date:   06/29/2024   Kappa/lambda light chains     Standing Status:   Future    Expected Date:   09/03/2023    Expiration Date:   06/29/2024   ANA w/Reflex if Positive    Standing Status:   Future    Expected Date:   09/03/2023    Expiration Date:   06/29/2024   Haptoglobin    Standing Status:   Future    Expected Date:   09/03/2023    Expiration Date:   06/29/2024   Direct antiglobulin test (not at [facility])    Standing Status:   Future    Expected Date:   09/03/2023    Expiration Date:   06/29/2024     Future Appointments  Date Time Provider Department Center  07/28/2023  4:10 PM Claiborne Rigg, NP [facility] None  07/30/2023  2:10 PM Claiborne Rigg, NP [facility] None  08/03/2023 11:40 AM Fanny Dance, MD [facility] CPR  09/03/2023 12:30 PM [facility] None  09/17/2023  2:45 PM [facility] None  09/17/2023  3:20 PM Kyrese Gartman, Archie Patten, MD [facility] None    This document was completed utilizing speech recognition software. Grammatical errors, random word insertions, pronoun errors, and incomplete sentences are an occasional consequence of this system due to software limitations, ambient noise, and hardware issues. Any formal questions or concerns about the content, text or information contained within the body of this dictation should be directly addressed to the provider for clarification.

## 2023-06-30 NOTE — Telephone Encounter (Signed)
Scheduled lab only appointment per request from provider. Patient is aware of all appointment details and address was provided.

## 2023-07-28 ENCOUNTER — Ambulatory Visit: Payer: 59 | Admitting: Nurse Practitioner

## 2023-07-30 ENCOUNTER — Encounter: Payer: 59 | Admitting: Nurse Practitioner

## 2023-08-03 ENCOUNTER — Encounter: Payer: Self-pay | Admitting: Physical Medicine & Rehabilitation

## 2023-08-03 ENCOUNTER — Encounter: Payer: 59 | Attending: Physical Medicine & Rehabilitation | Admitting: Physical Medicine & Rehabilitation

## 2023-08-03 VITALS — BP 179/103 | HR 79 | Ht 66.0 in | Wt 199.2 lb

## 2023-08-03 DIAGNOSIS — M170 Bilateral primary osteoarthritis of knee: Secondary | ICD-10-CM | POA: Diagnosis not present

## 2023-08-03 DIAGNOSIS — G894 Chronic pain syndrome: Secondary | ICD-10-CM | POA: Diagnosis not present

## 2023-08-03 DIAGNOSIS — Z5181 Encounter for therapeutic drug level monitoring: Secondary | ICD-10-CM | POA: Insufficient documentation

## 2023-08-03 MED ORDER — TRIAMCINOLONE ACETONIDE 32 MG IX SRER
64.0000 mg | Freq: Once | INTRA_ARTICULAR | Status: AC
  Administered 2023-08-03: 64 mg via INTRA_ARTICULAR

## 2023-08-03 NOTE — Progress Notes (Signed)
 Subjective:    Patient ID: Marie Church, female    DOB: 17-Oct-1955, 68 y.o.   MRN: [id number]  HPI   HPI  Marie Church is a 68 y.o. year old female  who  has a past medical history of Anemia, Anxiety, Arthritis, Bacteremia (07/31/2022), Breast cancer (HCC), Cataract, CHF (congestive heart failure) (HCC), DM type 2 (diabetes mellitus, type 2) (HCC), Hypertension, Port or reservoir infection (07/30/2022), Streptococcal bacteremia (07/30/2022), and Thrombocytopenia (HCC) (03/30/2021).   They are presenting to PM&R clinic as a new patient for pain management evaluation. They were referred by Dr. August Saucer for treatment of b/l Knee OA  pain.  Patient has had pain in her knees bilaterally for over 10 years.  Pain is worse in her right knee than her left knee.  Denies any history of trauma to her knees or surgery to her knees.  Standing and walking worsens her pain.  She had a right knee injection, was on by Dr. August Saucer 12/28/2022 for severe knee OA.  Dr. August Saucer discussed TKA however patient not interested at this time.  She previously had chemotherapy and x-ray treatment for breast cancer.   Red flag symptoms: No red flags for back pain endorsed in Hx or ROS  Medications tried:  Topical medications Voltaren doesn't help her- made it hurt worse Nsaids Ibuprofen minimal benefit  Tylenol  Helps slightly  Opiates  Tramadol helped her move around a little better Gabapentin / Lyrica  - Denies  TCAs  - Denies  SNRIs  Duloxetine - denies  Other  - denies   Other treatments: PT- for her knees was helpful a few years ago  TENs unit - denies  Injections Cortisone knee injection  Surgery denies    Interval History 04/05/23 Patient is here for follow-up regarding her severe chronic knee pain.  Pain continues to be poorly controlled and makes it very difficult for her to ambulate.  Pain continues to be worse over the right knee compared to her left knee.  Tramadol is no longer helping her pain and  keep it under control.  She reports last cortisone injection she had in her right knee helped for a few weeks.  Interval History 04/22/2023 Patient is here for bilateral knee injection with Zilretta.  She reports she has not had any knee injection in over 3 months.  She reports that Norco 5 has been helping keep her pain at a more tolerable level.  Denies any side effects with this medication.  Denies any new significant changes in her medical history.   Interval History 06/14/23 Pt reports zilretta injections last visit was very beneficial. Injections are starting to wear off.  She continues to use Norco 5 with benefit, uses it only when pain is severe.  Last Rx lasted over 2 months.  No side effects with the medication.   Interval History 08/03/23 Patient reports Xarelto lasted for about 2 months after prior injection and then started to wear off.  She is here for repeat injection today.  She continues to use Norco 5 with reduction in her pain, she tries to use this sparingly only when pain is very severe.  She forgot to take her BP medications last night and this morning.  Pain Inventory Average Pain 9 Pain Right Now 7 My pain is intermittent and dull  In the last 24 hours, has pain interfered with the following? General activity 7 Relation with others 9 Enjoyment of life 7 What TIME of day is your  pain at its worst? morning , daytime, evening, and night Sleep (in general) Good  Pain is worse with: walking, standing, and some activites Pain improves with: medication and injections Relief from Meds: 9       Family History  Problem Relation Age of Onset   Heart failure Neg Hx    Heart disease Neg Hx    Heart attack Neg Hx    Colon polyps Neg Hx    Colon cancer Neg Hx    Esophageal cancer Neg Hx    Stomach cancer Neg Hx    Rectal cancer Neg Hx    Social History   Socioeconomic History   Marital status: Widowed    Spouse name: Not on file   Number of children: Not on  file   Years of education: Not on file   Highest education level: Not on file  Occupational History   Not on file  Tobacco Use   Smoking status: Former    Current packs/day: 0.00    Types: Cigarettes    Start date: 48    Quit date: 2000    Years since quitting: 25.2   Smokeless tobacco: Never  Vaping Use   Vaping status: Never Used  Substance and Sexual Activity   Alcohol use: Not Currently   Drug use: Never   Sexual activity: Not on file  Other Topics Concern   Not on file  Social History Narrative   Not on file   Social Drivers of Health   Financial Resource Strain: Low Risk  (02/27/2022)   [facility]   Overall Financial Resource Strain (CARDIA)    Difficulty of Paying Living Expenses: Not hard at all  Food Insecurity: No Food Insecurity (07/30/2022)   Hunger Vital Sign    Worried About Running Out of Food in the Last Year: Never true    Ran Out of Food in the Last Year: Never true  Transportation Needs: No Transportation Needs (07/30/2022)   PRAPARE - Administrator, Civil Service (Medical): No    Lack of Transportation (Non-Medical): No  Physical Activity: Not on file  Stress: Stress Concern Present (02/04/2021)   [employer] of Occupational Health - Occupational Stress Questionnaire    Feeling of Stress : To some extent  Social Connections: Unknown (07/14/2022)   [facility], [facility]   Social Network    Social Network: Not on file   Past Surgical History:  Procedure Laterality Date   BREAST LUMPECTOMY Right 11/30/2020   BRONCHIAL BIOPSY  04/09/2021   Procedure: BRONCHIAL BIOPSIES;  Surgeon: Omar Person, MD;  Location: [facility];  Service: Pulmonary;;   BRONCHIAL NEEDLE ASPIRATION BIOPSY  04/09/2021   Procedure: BRONCHIAL NEEDLE ASPIRATION BIOPSIES;  Surgeon: Omar Person, MD;  Location: [facility];  Service:  Pulmonary;;   BRONCHIAL WASHINGS  04/09/2021   Procedure: BRONCHIAL WASHINGS;  Surgeon: Omar Person, MD;  Location: [facility];  Service: Pulmonary;;   ENDOBRONCHIAL ULTRASOUND N/A 04/09/2021   Procedure: ENDOBRONCHIAL ULTRASOUND;  Surgeon: Omar Person, MD;  Location: [facility];  Service: Pulmonary;  Laterality: N/A;   HEMOSTASIS CONTROL  04/09/2021   Procedure: HEMOSTASIS CONTROL;  Surgeon: Omar Person, MD;  Location: [facility];  Service: Pulmonary;;   IR REMOVAL TUN ACCESS W/ PORT W/O FL MOD SED  07/30/2022   RIGHT/LEFT HEART CATH AND CORONARY ANGIOGRAPHY N/A 10/10/2020   Procedure: RIGHT/LEFT  HEART CATH AND CORONARY ANGIOGRAPHY;  Surgeon: Dolores Patty, MD;  Location: [facility];  Service: Cardiovascular;  Laterality: N/A;   VIDEO BRONCHOSCOPY N/A 04/09/2021   Procedure: VIDEO BRONCHOSCOPY WITH FLUORO;  Surgeon: Omar Person, MD;  Location: [facility];  Service: Pulmonary;  Laterality: N/A;   Past Medical History:  Diagnosis Date   Anemia    Anxiety    Arthritis    Bacteremia 07/31/2022   Breast cancer (HCC)    Cataract    CHF (congestive heart failure) (HCC)    Pt states issues is resolved   DM type 2 (diabetes mellitus, type 2) (HCC)    reportedly steroid induced   Hypertension    Port or reservoir infection 07/30/2022   Streptococcal bacteremia 07/30/2022   Thrombocytopenia (HCC) 03/30/2021   BP (!) 179/103   Pulse 79   Ht 5\' 6"  (1.676 m)   Wt 199 lb 3.2 oz (90.4 kg)   SpO2 98%   BMI 32.15 kg/m   Opioid Risk Score:   Fall Risk Score:  `1  Depression screen [facility] 2/9     08/03/2023   12:39 PM 06/14/2023   10:09 AM 04/29/2023    9:32 AM 04/22/2023    2:31 PM 04/05/2023    3:25 PM 03/05/2023    9:34 AM 12/21/2022    2:16 PM  Depression screen PHQ 2/9  Decreased Interest 0 0 0 0 0 2 1  Down, Depressed, Hopeless 0 0 0 0 0 0 0  PHQ - 2 Score 0 0 0 0 0 2 1  Altered sleeping   0   0 0  Tired, decreased energy   3   3 2    Change in appetite   0   0 0  Feeling bad or failure about yourself    0   0 0  Trouble concentrating   0   0 0  Moving slowly or fidgety/restless   0   0 0  Suicidal thoughts   0   0 0  PHQ-9 Score   3   5 3   Difficult doing work/chores   Extremely dIfficult        Review of Systems  Constitutional: Negative.   HENT: Negative.    Eyes: Negative.   Respiratory: Negative.    Cardiovascular: Negative.   Gastrointestinal: Negative.   Endocrine: Negative.   Genitourinary: Negative.   Musculoskeletal:  Positive for gait problem.       Pain in both knees  Skin: Negative.   Allergic/Immunologic: Negative.   Hematological: Negative.   Psychiatric/Behavioral: Negative.    All other systems reviewed and are negative.      Objective:   Physical Exam   Gen: no distress, normal appearing HEENT: oral mucosa pink and moist, NCAT Chest: normal effort, normal rate of breathing Abd: soft, non-distended Ext: no edema Psych: pleasant, normal affect Skin: intact Neuro: alert and awake, follows commands RUE: 5/5 Deltoid, 5/5 Biceps, 5/5 Triceps, 5/5 5/5 Grip LUE: 5/5 Deltoid, 5/5 Biceps, 5/5 Triceps, 5/5 5/5 Grip RLE: HF 5/5, KE 5/5, ADF 5/5, APF 5/5 LLE: HF 5/5, KE 5/5, ADF 5/5, APF 5/5 Sensory exam normal for light touch and pain in all 4 limbs.   Musculoskeletal:  R knee tenderness > L knee tenderness  + b/l knee crepitus Antalgic gait  No significant knee redness, skin changes or signs of infection in either knee       Assessment & Plan:   1) Chronic b/l  Knee pain with b/l Knee OA Right greater than Left  2) Hx of breast cancer   -Discussed PT, She reports limited by transportation/schedule -Zynex, Knee brace ordered prior visit-reviewed how to put this on with patient today -Hold off on TENS due to prior cancer Hx -UDS and pain agreement today -Tramadol 50mg  TID PRN-was discontinued prior visit -Continue Norco 5-325 TID PRN, ordered #90 -Continue UDS and pill counts.   Continue PDMP monitoring.  Pain contract completed prior visit. -Pill counts consistent, appears she is using medication sparingly -Consider Genicular nerve block  - BP elevated, BP 167/92, patient did not take her blood pressure medication yesterday or today.  Advised taking her medication at prescribed and monitoring BP closely for the next few weeks. -Pain is fairly constant consider Butrans patch next visit  Knee injection today with Zilretta bilateral  Indication:Knee pain not relieved by medication management and other conservative care.   Informed consent was obtained after describing risks and benefits of the procedure with the patient, this includes bleeding, bruising, infection and medication side effects. The patient wishes to proceed and has given written consent. The patient was placed in a recumbent position. The Lateral aspect of the knee was marked and prepped with Betadine and alcohol. It was then entered with a 21-gauge 1-1/2 inch needle.  After negative draw back for blood, a solution containing 5 ML of 32 mg Zilretta solution was injected. The patient tolerated the procedure well. Post procedure instructions were given.    Procedure was completed for both knees

## 2023-08-13 DIAGNOSIS — I10 Essential (primary) hypertension: Secondary | ICD-10-CM | POA: Diagnosis not present

## 2023-08-13 DIAGNOSIS — H25811 Combined forms of age-related cataract, right eye: Secondary | ICD-10-CM | POA: Diagnosis not present

## 2023-08-30 ENCOUNTER — Other Ambulatory Visit: Payer: Self-pay

## 2023-08-30 ENCOUNTER — Encounter: Payer: Self-pay | Admitting: Pharmacist

## 2023-08-30 ENCOUNTER — Other Ambulatory Visit (HOSPITAL_BASED_OUTPATIENT_CLINIC_OR_DEPARTMENT_OTHER): Admitting: Pharmacist

## 2023-08-30 DIAGNOSIS — E1165 Type 2 diabetes mellitus with hyperglycemia: Secondary | ICD-10-CM

## 2023-08-30 MED ORDER — FARXIGA 10 MG PO TABS
10.0000 mg | ORAL_TABLET | Freq: Every day | ORAL | 2 refills | Status: DC
  Filled 2023-08-30 – 2023-09-13 (×3): qty 90, 90d supply, fill #0

## 2023-08-30 NOTE — Progress Notes (Signed)
 Patient appeared on insurance report for not passing the quality metrics in 2024: Medication Adherence for Diabetes (MAD). Unfortunately, this is a common failed measure for her.  Outreach to the patient was Successful. Per her daughter, the medication was not available at [facility] when they tried to fill it in January. I sent the rxn to our pharmacy where it was processed for a 90-day supply at a $0 copay.  [name], PharmD, Becky Bowels, CPP Clinical Pharmacist [facility] & [facility] [phone]

## 2023-09-03 ENCOUNTER — Inpatient Hospital Stay: Payer: 59 | Attending: Oncology

## 2023-09-08 ENCOUNTER — Other Ambulatory Visit: Payer: Self-pay

## 2023-09-09 ENCOUNTER — Telehealth: Payer: Self-pay | Admitting: Physical Medicine & Rehabilitation

## 2023-09-09 ENCOUNTER — Encounter: Admitting: Physical Medicine & Rehabilitation

## 2023-09-09 ENCOUNTER — Other Ambulatory Visit: Payer: Self-pay

## 2023-09-09 NOTE — Telephone Encounter (Signed)
 Patient had to cancel appt due to being in [facility] her mom is in hospital.  She will need a refill on hydrocodone .  Please call patient.

## 2023-09-10 MED ORDER — HYDROCODONE-ACETAMINOPHEN 5-325 MG PO TABS
1.0000 | ORAL_TABLET | Freq: Three times a day (TID) | ORAL | 0 refills | Status: DC | PRN
  Filled 2023-09-10: qty 90, 30d supply, fill #0

## 2023-09-11 ENCOUNTER — Other Ambulatory Visit: Payer: Self-pay

## 2023-09-13 ENCOUNTER — Other Ambulatory Visit: Payer: Self-pay | Admitting: Nurse Practitioner

## 2023-09-13 ENCOUNTER — Other Ambulatory Visit (HOSPITAL_COMMUNITY): Payer: Self-pay

## 2023-09-13 ENCOUNTER — Other Ambulatory Visit: Payer: Self-pay

## 2023-09-13 DIAGNOSIS — K219 Gastro-esophageal reflux disease without esophagitis: Secondary | ICD-10-CM

## 2023-09-13 DIAGNOSIS — E1165 Type 2 diabetes mellitus with hyperglycemia: Secondary | ICD-10-CM

## 2023-09-13 DIAGNOSIS — I509 Heart failure, unspecified: Secondary | ICD-10-CM

## 2023-09-13 NOTE — Telephone Encounter (Signed)
 Copied from CRM [id number]. Topic: Clinical - Medication Refill >> Sep 13, 2023 10:46 AM Baldomero Bone wrote: Most Recent Primary Care Visit:  Provider: Winda Hastings W  Department: [facility]  Visit Type: OFFICE VISIT  Date: 04/29/2023  Medication: famotidine  (PEPCID ) 40 MG tablet FARXIGA  10 MG TABS tablet spironolactone  (ALDACTONE ) 25 MG tablet  Has the patient contacted their pharmacy? Yes (Agent: If no, request that the patient contact the pharmacy for the refill. If patient does not wish to contact the pharmacy document the reason why and proceed with request.) (Agent: If yes, when and what did the pharmacy advise?)  Is this the correct pharmacy for this prescription? Yes If no, delete pharmacy and type the correct one.  This is the patient's preferred pharmacy:  [facility] [address], [place] - [address] [place] [address] Phone: [phone] Fax: [phone]   Has the prescription been filled recently? No  Is the patient out of the medication? No  Has the patient been seen for an appointment in the last year OR does the patient have an upcoming appointment? Yes  Can we respond through MyChart? No  Agent: Please be advised that Rx refills may take up to 3 business days. We ask that you follow-up with your pharmacy.

## 2023-09-13 NOTE — Telephone Encounter (Unsigned)
 Copied from CRM [id number]. Topic: Clinical - Medication Refill >> Sep 13, 2023 10:41 AM Baldomero Bone wrote: Most Recent Primary Care Visit:  Provider: Winda Hastings W  [facility]  Visit Type: OFFICE VISIT  Date: 04/29/2023  Medication: famotidine  (PEPCID ) 40 MG tablet FARXIGA  10 MG TABS tablet spironolactone  (ALDACTONE ) 25 MG tablet  Has the patient contacted their pharmacy? Yes (Agent: If no, request that the patient contact the pharmacy for the refill. If patient does not wish to contact the pharmacy document the reason why and proceed with request.) (Agent: If yes, when and what did the pharmacy advise?)  Is this the correct pharmacy for this prescription? Yes If no, delete pharmacy and type the correct one.  This is the patient's preferred pharmacy:  [facility] [address], [place] - [address] [place] [address] Phone: [phone] Fax: [phone]   Has the prescription been filled recently? No  Is the patient out of the medication? No  Has the patient been seen for an appointment in the last year OR does the patient have an upcoming appointment? Yes  Can we respond through MyChart? No  Agent: Please be advised that Rx refills may take up to 3 business days. We ask that you follow-up with your pharmacy.

## 2023-09-15 MED ORDER — SPIRONOLACTONE 25 MG PO TABS: 25.0000 mg | ORAL_TABLET | Freq: Every day | ORAL | 1 refills | Status: DC

## 2023-09-15 MED ORDER — FAMOTIDINE 40 MG PO TABS: 40.0000 mg | ORAL_TABLET | Freq: Every day | ORAL | 1 refills | Status: AC

## 2023-09-15 MED ORDER — FARXIGA 10 MG PO TABS: 10.0000 mg | ORAL_TABLET | Freq: Every day | ORAL | 0 refills | Status: DC

## 2023-09-15 NOTE — Telephone Encounter (Signed)
 Requested Prescriptions  Pending Prescriptions Disp Refills   famotidine  (PEPCID ) 40 MG tablet 90 tablet 1    Sig: Take 1 tablet (40 mg total) by mouth daily.     Gastroenterology:  H2 Antagonists Passed - 09/15/2023 12:51 PM      Passed - Valid encounter within last 12 months    Recent Outpatient Visits           4 months ago Primary hypertension   [facility] [place]. [facility] Collins Dean, NP   8 months ago Encounter for Papanicolaou smear of cervix   [facility] [place]. [facility] [place], [place] W, NP   1 year ago Type 2 diabetes mellitus with hyperglycemia, without long-term current use of insulin  [facility])   [facility] Of [place]. [facility] [place], [place] W, NP               FARXIGA  10 MG TABS tablet 90 tablet 0    Sig: Take 1 tablet (10 mg total) by mouth daily.     Endocrinology:  Diabetes - SGLT2 Inhibitors Failed - 09/15/2023 12:51 PM      Failed - HBA1C is between 0 and 7.9 and within 180 days    Hgb A1c MFr Bld  Date Value Ref Range Status  08/20/2022 5.3 4.8 - 5.6 % Final    Comment:             Prediabetes: 5.7 - 6.4          Diabetes: >6.4          Glycemic control for adults with diabetes: <7.0          Passed - Cr in normal range and within 360 days    Creatinine, Ser  Date Value Ref Range Status  06/18/2023 0.90 0.44 - 1.00 mg/dL Final         Passed - eGFR in normal range and within 360 days    GFR, Estimated  Date Value Ref Range Status  06/18/2023 >60 >60 mL/min Final    Comment:    (NOTE) Calculated using the CKD-EPI Creatinine Equation (2021)    eGFR  Date Value Ref Range Status  04/29/2023 79 >59 mL/min/1.73 Final         Passed - Valid encounter within last 6 months    Recent Outpatient Visits           4 months ago Primary hypertension   [facility] [place]. [facility] Collins Dean, NP   8 months ago Encounter for Papanicolaou smear of cervix   [facility] [place]. [facility] [place], [place] W, NP   1 year ago Type 2 diabetes mellitus with hyperglycemia, without long-term current use of insulin  [facility])   [facility] - A Dept Of . [facility] [place], Zelda W, NP               spironolactone  (ALDACTONE ) 25 MG tablet 90 tablet 1    Sig: Take 1 tablet (25 mg total) by mouth daily.     Cardiovascular: Diuretics - Aldosterone Antagonist Failed - 09/15/2023 12:51 PM      Failed - Last BP in normal range    BP Readings from Last  1 Encounters:  08/03/23 (!) 179/103         Passed - Cr in normal range and within 180 days    Creatinine, Ser  Date Value Ref Range Status  06/18/2023 0.90 0.44 - 1.00 mg/dL Final         Passed - K in normal range and within 180 days    Potassium  Date Value Ref Range Status  06/18/2023 3.8 3.5 - 5.1 mmol/L Final         Passed - Na in normal range and within 180 days    Sodium  Date Value Ref Range Status  06/18/2023 140 135 - 145 mmol/L Final  04/29/2023 137 134 - 144 mmol/L Final         Passed - eGFR is 30 or above and within 180 days    GFR, Estimated  Date Value Ref Range Status  06/18/2023 >60 >60 mL/min Final    Comment:    (NOTE) Calculated using the CKD-EPI Creatinine Equation (2021)    eGFR  Date Value Ref Range Status  04/29/2023 79 >59 mL/min/1.73 Final         Passed - Valid encounter within last 6 months    Recent Outpatient Visits           4 months ago Primary hypertension   [facility] Of [place]. [facility] Collins Dean, NP   8 months ago Encounter for Papanicolaou smear of cervix   [facility] Of [place]. [facility] [place], [place] W, NP   1 year ago Type 2 diabetes mellitus with hyperglycemia,  without long-term current use of insulin  [facility])   [facility] - A Dept Of [place]. [facility] [place] Dean, [place]

## 2023-09-16 ENCOUNTER — Telehealth: Payer: Self-pay

## 2023-09-16 NOTE — Telephone Encounter (Signed)
 Call the patient to remind her about her appt tomorrow with Dr. Randye Buttner and her daughter, Ilda Malkin answered.  Daughter said her mom would not be able to make the appt because there is a conflict with the timing. Ilda Malkin was informed that a scheduler would reach out to her mom to reschedule the appointment.

## 2023-09-17 ENCOUNTER — Telehealth: Payer: Self-pay

## 2023-09-17 ENCOUNTER — Other Ambulatory Visit: Payer: 59

## 2023-09-17 ENCOUNTER — Telehealth: Payer: Self-pay | Admitting: Oncology

## 2023-09-17 ENCOUNTER — Inpatient Hospital Stay: Payer: 59 | Admitting: Oncology

## 2023-09-17 NOTE — Telephone Encounter (Signed)
 I informed Medley to call me at my direct line to re-schedule her appointment.

## 2023-09-17 NOTE — Telephone Encounter (Signed)
 Patient re-scheduled her appointments. Patient is aware of all appointment details.

## 2023-09-17 NOTE — Telephone Encounter (Signed)
 LVM on patient's phone to call Dr. Denette Finner office to set up new appt for the one that was cancelled by her daughter today.

## 2023-09-17 NOTE — Telephone Encounter (Signed)
 Entered in error

## 2023-09-22 ENCOUNTER — Ambulatory Visit: Payer: Self-pay

## 2023-09-22 ENCOUNTER — Ambulatory Visit: Attending: Physician Assistant | Admitting: Physician Assistant

## 2023-09-22 VITALS — BP 140/87 | HR 91 | Temp 98.4°F | Ht 66.0 in | Wt 198.0 lb

## 2023-09-22 DIAGNOSIS — E1165 Type 2 diabetes mellitus with hyperglycemia: Secondary | ICD-10-CM | POA: Diagnosis not present

## 2023-09-22 DIAGNOSIS — I5032 Chronic diastolic (congestive) heart failure: Secondary | ICD-10-CM | POA: Diagnosis not present

## 2023-09-22 DIAGNOSIS — K219 Gastro-esophageal reflux disease without esophagitis: Secondary | ICD-10-CM

## 2023-09-22 DIAGNOSIS — N3001 Acute cystitis with hematuria: Secondary | ICD-10-CM

## 2023-09-22 DIAGNOSIS — I10 Essential (primary) hypertension: Secondary | ICD-10-CM | POA: Diagnosis not present

## 2023-09-22 DIAGNOSIS — L729 Follicular cyst of the skin and subcutaneous tissue, unspecified: Secondary | ICD-10-CM | POA: Diagnosis not present

## 2023-09-22 DIAGNOSIS — N390 Urinary tract infection, site not specified: Secondary | ICD-10-CM

## 2023-09-22 DIAGNOSIS — Z7984 Long term (current) use of oral hypoglycemic drugs: Secondary | ICD-10-CM

## 2023-09-22 DIAGNOSIS — I509 Heart failure, unspecified: Secondary | ICD-10-CM

## 2023-09-22 LAB — POCT URINALYSIS DIP (CLINITEK)
Bilirubin, UA: NEGATIVE
Glucose, UA: NEGATIVE mg/dL
Ketones, POC UA: NEGATIVE mg/dL
Nitrite, UA: NEGATIVE
POC PROTEIN,UA: 100 — AB
Spec Grav, UA: >=1.03 — AB (ref 1.010–1.025)
Urobilinogen, UA: 1 U/dL
pH, UA: 6 (ref 5.0–8.0)

## 2023-09-22 MED ORDER — ENTRESTO 24-26 MG PO TABS: 1.0000 | ORAL_TABLET | Freq: Two times a day (BID) | ORAL | 0 refills | Status: DC

## 2023-09-22 MED ORDER — SULFAMETHOXAZOLE-TRIMETHOPRIM 800-160 MG PO TABS: 1.0000 | ORAL_TABLET | Freq: Two times a day (BID) | ORAL | 0 refills | Status: DC

## 2023-09-22 MED ORDER — FARXIGA 10 MG PO TABS: 10.0000 mg | ORAL_TABLET | Freq: Every day | ORAL | 0 refills | Status: DC

## 2023-09-22 MED ORDER — FLUCONAZOLE 150 MG PO TABS: 150.0000 mg | ORAL_TABLET | Freq: Once | ORAL | 0 refills | Status: AC

## 2023-09-22 MED ORDER — CARVEDILOL 12.5 MG PO TABS: 12.5000 mg | ORAL_TABLET | Freq: Two times a day (BID) | ORAL | 0 refills | Status: DC

## 2023-09-22 NOTE — Telephone Encounter (Signed)
 Chief Complaint: fatigue Symptoms: see above  Frequency: "years" Pertinent Negatives: Patient denies CP, SOB, bleeding, N/V/D, flu-like symptoms, weakness Disposition: [] ED /[] Urgent Care (no appt availability in office) / [] Appointment(In office/virtual)/ []  [place] [facility]/ [] Home Care/ [] Refused Recommended Disposition /[] [place] [facility]/ [x]  Follow-up with PCP Additional Notes: Pt reports fatigue for years. Pt states this is not new. Pt was seen in the office today for a UTI and forgot to mention the fatigue. Pt denies weakness and dizziness, states she is just fatigued. Pt endorses drinking fluids and states her appetite and diet is good. Denies new meds. Denies sleeping difficulty. Denies fever, flu-like symptoms, N/V/D, blood stools, CP, and SOB. Pt states she thinks she needs a vitamin prescribed and mentioned she is interested in B12 injections. Since pt was seen today, RN advised pt RN would relay concern to the office. RN advised pt if she develops weakness, dizziness, decreased appetite, N/V, CP, or SOB she needs to go to the ED. Pt verbalized understanding.    Copied from CRM [id number]. Topic: Clinical - Medical Advice >> Sep 22, 2023 11:16 AM Opal Bill wrote: Reason for CRM: Pt just left from an appt with Dr. Jonelle Neri. She mentioned now that she hasn't had any energy at all for a while now and wanting to see if the doctor can prescribe something for that or advise on something to take. Answer Assessment - Initial Assessment Questions 1. DESCRIPTION: "Describe how you are feeling."     Denies weakness, but endorses fatigue for years. But states she does not have enough energy but has more energy than she did with breast cancer. 2. SEVERITY: "How bad is it?"  "Can you stand and walk?"   - MILD (0-3): Feels weak or tired, but does not interfere with work, school or normal activities.   - MODERATE (4-7): Able to stand and walk; weakness interferes with work, school, or normal  activities.   - SEVERE (8-10): Unable to stand or walk; unable to do usual activities.     Mild 3. ONSET: "When did these symptoms begin?" (e.g., hours, days, weeks, months)     "For years" 4. CAUSE: "What do you think is causing the weakness or fatigue?" (e.g., not drinking enough fluids, medical problem, trouble sleeping)     "I think my immune system is low, that's what I think", "my metabolism is low" 5. NEW MEDICINES:  "Have you started on any new medicines recently?" (e.g., opioid pain medicines, benzodiazepines, muscle relaxants, antidepressants, antihistamines, neuroleptics, beta blockers)     No other than hydrocodone , not new 6. OTHER SYMPTOMS: "Do you have any other symptoms?" (e.g., chest pain, fever, cough, SOB, vomiting, diarrhea, bleeding, other areas of pain)     Denies difficulty sleeping. Eating and drinking normally. Denies CP and SOB. Denies cough, fever, runny nose. Denies diarrhea, vomiting.Denies bleeding.  Was seen today in the office for UTI but forgot to talk about fatigue  Protocols used: Weakness (Generalized) and Fatigue-A-AH

## 2023-09-22 NOTE — Patient Instructions (Signed)
 Please drink adequate water

## 2023-09-22 NOTE — Progress Notes (Signed)
 Patient ID: Marie Church, female   DOB: [birth year], 68 y.o.   MRN: [id number]   Aaliyan Rucks, is a 68 y.o. female  WUJ:[id number]  [id number]  DOB - 22-Jan-1956  Chief Complaint  Patient presents with   Abdominal Pain    Abdominal pain, increased urinary frequency with pain / pressure X1 week Cyst on back of neck       Subjective:   Marie Church is a 68 y.o. female here today for a 1 week h/o mild dysuria and urine "not feeling right," and urinary frequency.  Her mom was in the hospital with C. Diff about 1-2 weeks ago.  She denies bloody diarrhea, fever, or increase in abdominal pain.  She does continue to have acid reflux and some constipation and wants to have abdominal xray.    Current work up with oncology-has labs on friday.  No diarrhea.  She does have some constipation.  No melena or hematochezia  Wants to have cyst on scalp removed.    No problems updated.  ALLERGIES: Allergies  Allergen Reactions   Doxycycline Rash   Amoxicillin-Pot Clavulanate Rash    Tolerates Rocephin    Lisinopril Rash    PAST MEDICAL HISTORY: Past Medical History:  Diagnosis Date   Anemia    Anxiety    Arthritis    Bacteremia 07/31/2022   Breast cancer (HCC)    Cataract    CHF (congestive heart failure) (HCC)    Pt states issues is resolved   DM type 2 (diabetes mellitus, type 2) (HCC)    reportedly steroid induced   Hypertension    Port or reservoir infection 07/30/2022   Streptococcal bacteremia 07/30/2022   Thrombocytopenia (HCC) 03/30/2021    MEDICATIONS AT HOME: Prior to Admission medications   Medication Sig Start Date End Date Taking? Authorizing Provider  famotidine  (PEPCID ) 40 MG tablet Take 1 tablet (40 mg total) by mouth daily. 09/15/23  Yes Fleming, Zelda W, NP  spironolactone  (ALDACTONE ) 25 MG tablet Take 1 tablet (25 mg total) by mouth daily. 09/15/23  Yes Fleming, Zelda W, NP  carvedilol  (COREG ) 12.5 MG tablet Take 1 tablet (12.5 mg total) by mouth 2  (two) times daily with a meal. 09/22/23   Lizeth Bencosme, Stan Eans, PA-C  FARXIGA  10 MG TABS tablet Take 1 tablet (10 mg total) by mouth daily. 09/22/23   Hassie Lint, PA-C  sacubitril -valsartan  (ENTRESTO ) 24-26 MG Take 1 tablet by mouth 2 (two) times daily. 09/22/23   Carnesha Maravilla, Stan Eans, PA-C    ROS: Neg HEENT Neg resp Neg cardiac Neg GU Neg MS Neg psych Neg neuro  Objective:   Vitals:   09/22/23 0859  BP: (!) 140/87  Pulse: 91  Temp: 98.4 F (36.9 C)  TempSrc: Oral  SpO2: 99%  Weight: 198 lb (89.8 kg)  Height: 5\' 6"  (1.676 m)   Exam General appearance : Awake, alert, not in any distress. Speech Clear. Not toxic looking HEENT: Atraumatic and Normocephalic Neck: Supple, no JVD. No cervical lymphadenopathy.  Chest: Good air entry bilaterally, CTAB.  No rales/rhonchi/wheezing CVS: S1 S2 regular, no murmurs.  Abdomen: Bowel sounds present, Non tender and not distended with no gaurding, rigidity or rebound. Neurology: Awake alert, and oriented X 3, CN II-XII intact, Non focal Skin: No Rash.  Very large cyst back of head that is mobile and feels fluid filled.  No erythema or sign of infection  Data Review Lab Results  Component Value Date   HGBA1C 5.3 08/20/2022   HGBA1C 6.4 (  H) 10/04/2020    Assessment & Plan   1. UTI (Primary) - POCT URINALYSIS DIP (CLINITEK) -urine culture -septra  ds bid for 7 days, diflucan sent if needed -increase water intake - DG Abd 1 View; Future - Basic Metabolic Panel  2. Primary hypertension - carvedilol  (COREG ) 12.5 MG tablet; Take 1 tablet (12.5 mg total) by mouth 2 (two) times daily with a meal.  Dispense: 180 tablet; Refill: 0  3. Chronic congestive heart failure, unspecified heart failure type (HCC) - carvedilol  (COREG ) 12.5 MG tablet; Take 1 tablet (12.5 mg total) by mouth 2 (two) times daily with a meal.  Dispense: 180 tablet; Refill: 0 - sacubitril -valsartan  (ENTRESTO ) 24-26 MG; Take 1 tablet by mouth 2 (two) times daily.  Dispense:  180 tablet; Refill: 0  4. Type 2 diabetes mellitus with hyperglycemia, without long-term current use of insulin  (HCC) - FARXIGA  10 MG TABS tablet; Take 1 tablet (10 mg total) by mouth daily.  Dispense: 90 tablet; Refill: 0  5. Scalp cyst - Ambulatory referral to General Surgery  6. GERD without esophagitis - DG Abd 1 View; Future    Return in about 3 months (around 12/23/2023) for PCP for chronic conditions.  The patient was given clear instructions to go to ER or return to medical center if symptoms don't improve, worsen or new problems develop. The patient verbalized understanding. The patient was told to call to get lab results if they haven't heard anything in the next week.      Dulce Gibbs, PA-C [facility] and [facility] [place], [place] [phone]   09/22/2023, 9:24 AM

## 2023-09-23 LAB — BASIC METABOLIC PANEL WITH GFR
BUN/Creatinine Ratio: 15 (ref 12–28)
BUN: 11 mg/dL (ref 8–27)
CO2: 24 mmol/L (ref 20–29)
Calcium: 8.9 mg/dL (ref 8.7–10.3)
Chloride: 104 mmol/L (ref 96–106)
Creatinine, Ser: 0.71 mg/dL (ref 0.57–1.00)
Glucose: 91 mg/dL (ref 70–99)
Potassium: 4.1 mmol/L (ref 3.5–5.2)
Sodium: 141 mmol/L (ref 134–144)
eGFR: 94 mL/min/{1.73_m2} (ref >59)

## 2023-09-24 ENCOUNTER — Inpatient Hospital Stay: Attending: Oncology

## 2023-09-24 ENCOUNTER — Inpatient Hospital Stay (HOSPITAL_BASED_OUTPATIENT_CLINIC_OR_DEPARTMENT_OTHER): Admitting: Oncology

## 2023-09-24 ENCOUNTER — Encounter: Payer: Self-pay | Admitting: Oncology

## 2023-09-24 VITALS — BP 150/87 | HR 83 | Temp 97.8°F | Resp 18 | Wt 200.8 lb

## 2023-09-24 DIAGNOSIS — D72819 Decreased white blood cell count, unspecified: Secondary | ICD-10-CM | POA: Insufficient documentation

## 2023-09-24 DIAGNOSIS — Z853 Personal history of malignant neoplasm of breast: Secondary | ICD-10-CM | POA: Insufficient documentation

## 2023-09-24 DIAGNOSIS — N390 Urinary tract infection, site not specified: Secondary | ICD-10-CM | POA: Diagnosis not present

## 2023-09-24 DIAGNOSIS — C773 Secondary and unspecified malignant neoplasm of axilla and upper limb lymph nodes: Secondary | ICD-10-CM

## 2023-09-24 DIAGNOSIS — R778 Other specified abnormalities of plasma proteins: Secondary | ICD-10-CM | POA: Diagnosis not present

## 2023-09-24 DIAGNOSIS — D649 Anemia, unspecified: Secondary | ICD-10-CM

## 2023-09-24 DIAGNOSIS — Z9221 Personal history of antineoplastic chemotherapy: Secondary | ICD-10-CM | POA: Diagnosis not present

## 2023-09-24 DIAGNOSIS — C50911 Malignant neoplasm of unspecified site of right female breast: Secondary | ICD-10-CM | POA: Diagnosis not present

## 2023-09-24 LAB — CBC WITH DIFFERENTIAL (CANCER CENTER ONLY)
Abs Immature Granulocytes: 0.19 10*3/uL — ABNORMAL HIGH (ref 0.00–0.07)
Basophils Absolute: 0 10*3/uL (ref 0.0–0.1)
Basophils Relative: 0 %
Eosinophils Absolute: 0 10*3/uL (ref 0.0–0.5)
Eosinophils Relative: 0 %
HCT: 27.5 % — ABNORMAL LOW (ref 36.0–46.0)
Hemoglobin: 8.9 g/dL — ABNORMAL LOW (ref 12.0–15.0)
Immature Granulocytes: 7 %
Lymphocytes Relative: 41 %
Lymphs Abs: 1.2 10*3/uL (ref 0.7–4.0)
MCH: 29.5 pg (ref 26.0–34.0)
MCHC: 32.4 g/dL (ref 30.0–36.0)
MCV: 91.1 fL (ref 80.0–100.0)
Monocytes Absolute: 0.1 10*3/uL (ref 0.1–1.0)
Monocytes Relative: 4 %
Neutro Abs: 1.4 10*3/uL — ABNORMAL LOW (ref 1.7–7.7)
Neutrophils Relative %: 48 %
Platelet Count: 184 10*3/uL (ref 150–400)
RBC: 3.02 MIL/uL — ABNORMAL LOW (ref 3.87–5.11)
RDW: 17.1 % — ABNORMAL HIGH (ref 11.5–15.5)
Smear Review: NORMAL
WBC Count: 2.9 10*3/uL — ABNORMAL LOW (ref 4.0–10.5)
nRBC: 0 % (ref 0.0–0.2)

## 2023-09-24 LAB — CMP (CANCER CENTER ONLY)
ALT: 13 U/L (ref 0–44)
AST: 21 U/L (ref 15–41)
Albumin: 3.8 g/dL (ref 3.5–5.0)
Alkaline Phosphatase: 73 U/L (ref 38–126)
Anion gap: 5 (ref 5–15)
BUN: 11 mg/dL (ref 8–23)
CO2: 28 mmol/L (ref 22–32)
Calcium: 8.8 mg/dL — ABNORMAL LOW (ref 8.9–10.3)
Chloride: 106 mmol/L (ref 98–111)
Creatinine: 0.72 mg/dL (ref 0.44–1.00)
GFR, Estimated: >60 mL/min (ref >60)
Glucose, Bld: 134 mg/dL — ABNORMAL HIGH (ref 70–99)
Potassium: 3.4 mmol/L — ABNORMAL LOW (ref 3.5–5.1)
Sodium: 139 mmol/L (ref 135–145)
Total Bilirubin: 0.4 mg/dL (ref 0.0–1.2)
Total Protein: 8.9 g/dL — ABNORMAL HIGH (ref 6.5–8.1)

## 2023-09-24 LAB — LACTATE DEHYDROGENASE: LDH: 157 U/L (ref 98–192)

## 2023-09-24 LAB — DIRECT ANTIGLOBULIN TEST (NOT AT ARMC)
DAT, IgG: NEGATIVE
DAT, complement: NEGATIVE

## 2023-09-24 LAB — SEDIMENTATION RATE: Sed Rate: 123 mm/h — ABNORMAL HIGH (ref 0–22)

## 2023-09-24 LAB — C-REACTIVE PROTEIN: CRP: 1 mg/dL — ABNORMAL HIGH (ref <1.0)

## 2023-09-24 NOTE — Assessment & Plan Note (Addendum)
 Chronic anemia with low hemoglobin levels. Hemoglobin was 11.2 in December 2024. Chemotherapy for breast cancer may have contributed. No current iron supplementation.   On her initial consultation with us  on 06/18/2023, labs revealed persistent anemia with hemoglobin of 10.2, hematocrit 32.8, MCV 94.5.  White count 3400 with normal differential, ANC of 1500.  Platelet count of 160,000.  CMP unremarkable except for total protein of 9.2 g/dL.  Creatinine was normal at 0.9, calcium normal at 9.4, albumin 4.1. Iron studies, ferritin, B12, folate, serum copper , LDH were within normal limits.   SPEP showed no evidence of M spike.  IFE showed polyclonal increase detected in one or more immunoglobulins.  Serum free kappa was elevated at 118 mg/L, serum free lambda was elevated at 33.6 mg/L, ratio elevated at 3.54.  Quantitative IgG was increased at 3368 mg/dL, IgA was also slightly increased at 375 mg/dL and IgM was also increased at 245 mg/dL.  Overall picture was not consistent with monoclonal gammopathy but rather polyclonal increase in proteins from uncertain etiology.  Normal renal function and normal calcium.   Labs today showed hemoglobin of 8.9, MCV 91.1.  White count also decreased at 2900 with ANC of 1400, platelet count normal at 284,000.   Given progressive anemia and leukopenia, I will see her closely in 6 weeks for follow-up with repeat labs.  If persistent cytopenias are noted, we will refer her for bone marrow biopsy.

## 2023-09-24 NOTE — Assessment & Plan Note (Addendum)
 Chronic, slightly elevated total protein of uncertain significance.    On her initial consultation with us  on 06/18/2023, labs revealed total protein of 9.2 g/dL.  Creatinine was normal at 0.9, calcium normal at 9.4, albumin 4.1.  Hemoglobin was stable at 10.2.   SPEP showed no evidence of M spike.  IFE showed polyclonal increase detected in one or more immunoglobulins.  Serum free kappa was elevated at 118 mg/L, serum free lambda was elevated at 33.6 mg/L, ratio elevated at 3.54.  Quantitative IgG was increased at 3368 mg/dL, IgA was also slightly increased at 375 mg/dL and IgM was also increased at 245 mg/dL.  Overall picture is not consistent with monoclonal gammopathy but rather polyclonal increase in proteins from uncertain etiology.  Normal renal function and normal calcium.    Total protein is slightly elevated at 8.9 today.  No hematological intervention is warranted at current levels.  We will pursue additional workup with ESR, CRP, ANA, rheumatoid factor and repeat myeloma labs today.  Will discuss results on return visit.

## 2023-09-24 NOTE — Assessment & Plan Note (Signed)
 Leukopenia with a decrease in white blood cell count from 3400 to 2900 cells/L. No signs of infection such as fever, chills, or respiratory symptoms. Possible causes include recent urinary tract infection or stressors affecting bone marrow function. Past chemotherapy may also be a contributing factor. - Repeat blood work in 6 weeks to monitor white blood cell count - Consider bone marrow biopsy if white blood cell count continues to decrease

## 2023-09-24 NOTE — Progress Notes (Signed)
 [facility]  HEMATOLOGY CLINIC PROGRESS NOTE  PATIENT NAME: Marie Church   MR#: [id number] DOB: Aug 03, 1955  Patient Care Team: Collins Dean, NP as PCP - General (Nurse Practitioner) Luana Rumple, MD as PCP - Cardiology (Cardiology)  Date of visit: 09/24/2023   ASSESSMENT & PLAN:   Marie Church is a 68 y.o. lady with a past medical history of stage II B right-sided breast cancer, ER negative, PR negative, HER2/neu positive, diagnosed in November 2021 and treated at [place], [place], hypertension, diabetes mellitus type 2, chronic thrombocytopenia, CHF from chemotherapy, which resolved, was referred to our service in January 2025 for evaluation of anemia and elevated total protein.     Normocytic anemia Chronic anemia with low hemoglobin levels. Hemoglobin was 11.2 in December 2024. Chemotherapy for breast cancer may have contributed. No current iron supplementation.   On her initial consultation with us  on 06/18/2023, labs revealed persistent anemia with hemoglobin of 10.2, hematocrit 32.8, MCV 94.5.  White count 3400 with normal differential, ANC of 1500.  Platelet count of 160,000.  CMP unremarkable except for total protein of 9.2 g/dL.  Creatinine was normal at 0.9, calcium normal at 9.4, albumin 4.1. Iron studies, ferritin, B12, folate, serum copper , LDH were within normal limits.   SPEP showed no evidence of M spike.  IFE showed polyclonal increase detected in one or more immunoglobulins.  Serum free kappa was elevated at 118 mg/L, serum free lambda was elevated at 33.6 mg/L, ratio elevated at 3.54.  Quantitative IgG was increased at 3368 mg/dL, IgA was also slightly increased at 375 mg/dL and IgM was also increased at 245 mg/dL.  Overall picture was not consistent with monoclonal gammopathy but rather polyclonal increase in proteins from uncertain etiology.  Normal renal function and normal calcium.   Labs today showed hemoglobin of 8.9, MCV 91.1.  White  count also decreased at 2900 with ANC of 1400, platelet count normal at 284,000.   Given progressive anemia and leukopenia, I will see her closely in 6 weeks for follow-up with repeat labs.  If persistent cytopenias are noted, we will refer her for bone marrow biopsy.   Elevated total protein Chronic, slightly elevated total protein of uncertain significance.    On her initial consultation with us  on 06/18/2023, labs revealed total protein of 9.2 g/dL.  Creatinine was normal at 0.9, calcium normal at 9.4, albumin 4.1.  Hemoglobin was stable at 10.2.   SPEP showed no evidence of M spike.  IFE showed polyclonal increase detected in one or more immunoglobulins.  Serum free kappa was elevated at 118 mg/L, serum free lambda was elevated at 33.6 mg/L, ratio elevated at 3.54.  Quantitative IgG was increased at 3368 mg/dL, IgA was also slightly increased at 375 mg/dL and IgM was also increased at 245 mg/dL.  Overall picture is not consistent with monoclonal gammopathy but rather polyclonal increase in proteins from uncertain etiology.  Normal renal function and normal calcium.    Total protein is slightly elevated at 8.9 today.  No hematological intervention is warranted at current levels.  We will pursue additional workup with ESR, CRP, ANA, rheumatoid factor and repeat myeloma labs today.  Will discuss results on return visit.  Breast cancer metastasized to axillary lymph node, right [facility]) Diagnosed in November 2021 and managed at [place], [place].  ER negative, PR negative, HER2 positive.  Originally plan was to treat her with [facility] regimen every 3 weeks for 6 cycles as part of neoadjuvant treatment.  C1 HP only due to poor PS. 05/16/20 added weekly Taxol. (THP)  She had Herceptin 5/13 and then was admitted with CHF [facility]) prior to her next treatment   She ended up having lumpectomy 11/2020 - 6 foci of IDC, largest 4mm but did have LVI and 2/18 nodes  12/2020 re-excision of positive margin    Completed 5 cycles Kadcyla 06/20/2021 - stopped due to low PLT   Currently in remission with no evidence of disease. Recent mammogram was normal. Discussed annual mammograms and monitoring for changes or new symptoms. HER2 positive status has a slightly higher recurrence risk  - Continue annual mammograms  - Monitor for changes in the breast or new symptoms  Leukopenia Leukopenia with a decrease in white blood cell count from 3400 to 2900 cells/L. No signs of infection such as fever, chills, or respiratory symptoms. Possible causes include recent urinary tract infection or stressors affecting bone marrow function. Past chemotherapy may also be a contributing factor. - Repeat blood work in 6 weeks to monitor white blood cell count - Consider bone marrow biopsy if white blood cell count continues to decrease   I spent a total of 25 minutes during this encounter with the patient including review of chart and various tests results, discussions about plan of care and coordination of care plan.  I reviewed lab results and outside records for this visit and discussed relevant results with the patient. Diagnosis, plan of care and treatment options were also discussed in detail with the patient. Opportunity provided to ask questions and answers provided to her apparent satisfaction. Provided instructions to call our clinic with any problems, questions or concerns prior to return visit. I recommended to continue follow-up with PCP and sub-specialists. She verbalized understanding and agreed with the plan. No barriers to learning was detected.  Arlo Berber, MD  09/24/2023 4:56 PM  [facility] - A DEPT OF [facility] [address] [place] [address] Dept: [phone] Dept Fax: [phone]   CHIEF COMPLAINT/ REASON FOR VISIT:  Follow-up for history of normocytic anemia, elevated total protein, stage II B right-sided breast  cancer diagnosed in 2021 and managed at outside facility in [place], [place].   INTERVAL HISTORY:  Discussed the use of AI scribe software for clinical note transcription with the patient, who gave verbal consent to proceed.  History of Present Illness JIAVANNA TEMPESTA is a 68 year old female who presents with low blood counts and a urinary tract infection.  She has been experiencing frequent urination, which led to the diagnosis of a urinary tract infection. She has not started the prescribed antibiotic, Bactrim , but plans to begin today. No fever, chills, or night sweats are present.  Her blood counts have been low, with a hemoglobin level of 8.9 and a white blood cell count of 2,900, both of which have decreased since her last visit. She denies symptoms of upper respiratory infections such as cough or runny nose but mentions soreness in her mouth and throat. She has a history of past chemotherapy, which may be contributing to her current low blood counts. She recalls being advised to undergo a bone marrow biopsy in the past due to low counts but did not follow through with it.  She has a history of arthritis in her knee for which she receives injections every three months. She has been experiencing low energy levels, which she attributes to her hydrocodone  use for pain  management. She started taking hydrocodone  approximately six months ago.  She underwent a mammogram in January, which was normal. She occasionally checks her breasts and notes a sore spot on one side but no lumps. No lumps in the breast and no blood in stools or black stools.    SUMMARY OF HEMATOLOGIC HISTORY:  She has had chronic anemia and previously thrombocytopenia, presumed to be from chemotherapy.  She was also found to have elevated total protein which was 9.8 in October 2024 and 9.4 in December 2024.  Hence a referral was sent was for further evaluation of anemia and elevated total protein.   The patient is not  currently on any medication for her breast cancer and is under observation. The patient underwent a lumpectomy for her right breast and had 18 lymph nodes removed, one of which was cancerous. The patient completed chemotherapy and radiation therapy nearly two years ago. The patient also reports having arthritis and is due for a knee replacement.   She denies fever, cough, diarrhea, or other infectious symptoms.  She denies epistaxis, bloody stool, melena, hematuria, bruising or other bleeding symptoms. She also denies unintentional weight loss, night sweats or other constitutional symptoms.   Chronic anemia with low hemoglobin levels. Hemoglobin was 11.2 in December 2024. Chemotherapy for breast cancer may have contributed. No current iron supplementation.    On her initial consultation with us  on 06/18/2023, labs revealed persistent anemia with hemoglobin of 10.2, hematocrit 32.8, MCV 94.5.  White count 3400 with normal differential, ANC of 1500.  Platelet count of 160,000.  CMP unremarkable except for total protein of 9.2 g/dL.  Creatinine was normal at 0.9, calcium normal at 9.4, albumin 4.1. Iron studies, ferritin, B12, folate, serum copper , LDH were within normal limits.   SPEP showed no evidence of M spike.  IFE showed polyclonal increase detected in one or more immunoglobulins.  Serum free kappa was elevated at 118 mg/L, serum free lambda was elevated at 33.6 mg/L, ratio elevated at 3.54.  Quantitative IgG was increased at 3368 mg/dL, IgA was also slightly increased at 375 mg/dL and IgM was also increased at 245 mg/dL.  Overall picture is not consistent with monoclonal gammopathy but rather polyclonal increase in proteins from uncertain etiology.  Normal renal function and normal calcium.    No hematological intervention is warranted at current levels.   We will pursue additional workup with ESR, CRP, ANA, rheumatoid factor and repeat myeloma labs 2 weeks prior to return visit.  I have reviewed  the past medical history, past surgical history, social history and family history with the patient and they are unchanged from previous note.  ALLERGIES: She is allergic to doxycycline, amoxicillin-pot clavulanate, and lisinopril.  MEDICATIONS:  Current Outpatient Medications  Medication Sig Dispense Refill   carvedilol  (COREG ) 12.5 MG tablet Take 1 tablet (12.5 mg total) by mouth 2 (two) times daily with a meal. 180 tablet 0   famotidine  (PEPCID ) 40 MG tablet Take 1 tablet (40 mg total) by mouth daily. 90 tablet 1   FARXIGA  10 MG TABS tablet Take 1 tablet (10 mg total) by mouth daily. 90 tablet 0   sacubitril -valsartan  (ENTRESTO ) 24-26 MG Take 1 tablet by mouth 2 (two) times daily. 180 tablet 0   spironolactone  (ALDACTONE ) 25 MG tablet Take 1 tablet (25 mg total) by mouth daily. 90 tablet 1   sulfamethoxazole -trimethoprim  (BACTRIM  DS) 800-160 MG tablet Take 1 tablet by mouth 2 (two) times daily. (Patient not taking: Reported on 09/24/2023) 14 tablet  0   No current facility-administered medications for this visit.     REVIEW OF SYSTEMS:    Review of Systems - Oncology  All other pertinent systems were reviewed with the patient and are negative.  PHYSICAL EXAMINATION:    Onc Performance Status - 09/24/23 0857       ECOG Perf Status   ECOG Perf Status Ambulatory and capable of all selfcare but unable to carry out any work activities.  Up and about more than 50% of waking hours      KPS SCALE   KPS % SCORE Cares for self, unable to carry on normal activity or to do active work             Vitals:   09/24/23 0842  BP: (!) 150/87  Pulse: 83  Resp: 18  Temp: 97.8 F (36.6 C)  SpO2: 98%   Filed Weights   09/24/23 0842  Weight: 200 lb 12.8 oz (91.1 kg)    Physical Exam Constitutional:      General: She is not in acute distress.    Appearance: Normal appearance.  HENT:     Head: Normocephalic and atraumatic.  Eyes:     General: No scleral icterus.     Conjunctiva/sclera: Conjunctivae normal.  Cardiovascular:     Rate and Rhythm: Normal rate and regular rhythm.     Heart sounds: Normal heart sounds.  Pulmonary:     Effort: Pulmonary effort is normal.     Breath sounds: Normal breath sounds.  Abdominal:     General: There is no distension.  Musculoskeletal:     Right lower leg: No edema.     Left lower leg: No edema.  Lymphadenopathy:     Cervical: No cervical adenopathy.  Neurological:     General: No focal deficit present.     Mental Status: She is alert and oriented to person, place, and time.  Psychiatric:        Mood and Affect: Mood normal.        Behavior: Behavior normal.        Thought Content: Thought content normal.     LABORATORY DATA:   I have reviewed the data as listed.  Results for orders placed or performed in visit on 09/24/23  C-reactive protein  Result Value Ref Range   CRP 1.0 (H) <1.0 mg/dL  Sedimentation rate  Result Value Ref Range   Sed Rate 123 (H) 0 - 22 mm/hr  Lactate dehydrogenase  Result Value Ref Range   LDH 157 98 - 192 U/L  CMP (Cancer Center only)  Result Value Ref Range   Sodium 139 135 - 145 mmol/L   Potassium 3.4 (L) 3.5 - 5.1 mmol/L   Chloride 106 98 - 111 mmol/L   CO2 28 22 - 32 mmol/L   Glucose, Bld 134 (H) 70 - 99 mg/dL   BUN 11 8 - 23 mg/dL   Creatinine 9.14 7.82 - 1.00 mg/dL   Calcium 8.8 (L) 8.9 - 10.3 mg/dL   Total Protein 8.9 (H) 6.5 - 8.1 g/dL   Albumin 3.8 3.5 - 5.0 g/dL   AST 21 15 - 41 U/L   ALT 13 0 - 44 U/L   Alkaline Phosphatase 73 38 - 126 U/L   Total Bilirubin 0.4 0.0 - 1.2 mg/dL   GFR, Estimated >95 >62 mL/min   Anion gap 5 5 - 15  CBC with Differential (Cancer Center Only)  Result Value Ref Range   WBC Count  2.9 (L) 4.0 - 10.5 K/uL   RBC 3.02 (L) 3.87 - 5.11 MIL/uL   Hemoglobin 8.9 (L) 12.0 - 15.0 g/dL   HCT 16.1 (L) 09.6 - 04.5 %   MCV 91.1 80.0 - 100.0 fL   MCH 29.5 26.0 - 34.0 pg   MCHC 32.4 30.0 - 36.0 g/dL   RDW 40.9 (H) 81.1 - 91.4 %    Platelet Count 184 150 - 400 K/uL   nRBC 0.0 0.0 - 0.2 %   Neutrophils Relative % 48 %   Neutro Abs 1.4 (L) 1.7 - 7.7 K/uL   Lymphocytes Relative 41 %   Lymphs Abs 1.2 0.7 - 4.0 K/uL   Monocytes Relative 4 %   Monocytes Absolute 0.1 0.1 - 1.0 K/uL   Eosinophils Relative 0 %   Eosinophils Absolute 0.0 0.0 - 0.5 K/uL   Basophils Relative 0 %   Basophils Absolute 0.0 0.0 - 0.1 K/uL   WBC Morphology Mild Left Shift (1-5% metas, occ myelo)    Smear Review Normal platelet morphology    Immature Granulocytes 7 %   Abs Immature Granulocytes 0.19 (H) 0.00 - 0.07 K/uL   Tear Drop Cells PRESENT    Ovalocytes PRESENT   Direct antiglobulin test (not at [facility])  Result Value Ref Range   DAT, complement NEG    DAT, IgG      NEG Performed at [facility], [address], [place], [place] [address]      RADIOGRAPHIC STUDIES:  No recent pertinent imaging studies available to review.  Orders Placed This Encounter  Procedures   CBC with Differential (Cancer Center Only)    Standing Status:   Future    Expected Date:   11/05/2023    Expiration Date:   09/23/2024   CMP (Cancer Center only)    Standing Status:   Future    Expected Date:   11/05/2023    Expiration Date:   09/23/2024   Reticulocytes    Standing Status:   Future    Expected Date:   11/05/2023    Expiration Date:   09/23/2024   Hepatitis panel, acute    Standing Status:   Future    Expected Date:   11/05/2023    Expiration Date:   09/23/2024   HIV antibody (with reflex)    Standing Status:   Future    Expected Date:   11/05/2023    Expiration Date:   09/23/2024   Epstein-Barr virus VCA, IgG    Standing Status:   Future    Expected Date:   11/05/2023    Expiration Date:   09/23/2024   Methylmalonic acid, serum    Standing Status:   Future    Expected Date:   11/05/2023    Expiration Date:   09/23/2024     Future Appointments  Date Time Provider Department Center  11/05/2023 10:20 AM Lylia Sand, MD [facility] CPR   11/12/2023 10:00 AM [facility] None  11/12/2023 10:30 AM Mckenzi Buonomo, Gale Jude, MD [facility] None  12/24/2023  9:10 AM Collins Dean, NP [facility] None     This document was completed utilizing speech recognition software. Grammatical errors, random word insertions, pronoun errors, and incomplete sentences are an occasional consequence of this system due to software limitations, ambient noise, and hardware issues. Any formal questions or concerns about the content, text or information contained within the body of this dictation should be directly addressed to the provider for clarification.

## 2023-09-24 NOTE — Assessment & Plan Note (Addendum)
 Diagnosed in November 2021 and managed at [place], [place].  ER negative, PR negative, HER2 positive.  Originally plan was to treat her with [facility] regimen every 3 weeks for 6 cycles as part of neoadjuvant treatment.     C1 HP only due to poor PS. 05/16/20 added weekly Taxol. 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 Recent mammogram was normal. Discussed annual mammograms and monitoring for changes or new symptoms. HER2 positive status has a slightly higher recurrence risk  - Continue annual mammograms  - Monitor for changes in the breast or new symptoms

## 2023-09-26 LAB — RHEUMATOID FACTOR: Rheumatoid fact SerPl-aCnc: 265.9 [IU]/mL — ABNORMAL HIGH (ref <14.0)

## 2023-09-26 LAB — HAPTOGLOBIN: Haptoglobin: 187 mg/dL (ref 37–355)

## 2023-09-27 LAB — ENA+DNA/DS+ANTICH+CENTRO+JO...
Anti JO-1: <0.2 AI (ref 0.0–0.9)
Centromere Ab Screen: <0.2 AI (ref 0.0–0.9)
Chromatin Ab SerPl-aCnc: <0.2 AI (ref 0.0–0.9)
ENA SM Ab Ser-aCnc: <0.2 AI (ref 0.0–0.9)
Ribonucleic Protein: <0.2 AI (ref 0.0–0.9)
SSA (Ro) (ENA) Antibody, IgG: >8 AI — ABNORMAL HIGH (ref 0.0–0.9)
SSB (La) (ENA) Antibody, IgG: >8 AI — ABNORMAL HIGH (ref 0.0–0.9)
Scleroderma (Scl-70) (ENA) Antibody, IgG: <0.2 AI (ref 0.0–0.9)
ds DNA Ab: <1 [IU]/mL (ref 0–9)

## 2023-09-27 LAB — LAB REPORT - SCANNED: EGFR (African American): 28

## 2023-09-27 LAB — URINE CULTURE

## 2023-09-27 LAB — KAPPA/LAMBDA LIGHT CHAINS
Kappa free light chain: 127.3 mg/L — ABNORMAL HIGH (ref 3.3–19.4)
Kappa, lambda light chain ratio: 3.32 — ABNORMAL HIGH (ref 0.26–1.65)
Lambda free light chains: 38.4 mg/L — ABNORMAL HIGH (ref 5.7–26.3)

## 2023-09-27 LAB — ANA W/REFLEX IF POSITIVE: Anti Nuclear Antibody (ANA): POSITIVE — AB

## 2023-09-29 ENCOUNTER — Telehealth: Payer: Self-pay | Admitting: Nurse Practitioner

## 2023-09-29 NOTE — Telephone Encounter (Signed)
 Copied from CRM [id number]. Topic: Clinical - Medical Advice >> Sep 22, 2023 11:16 AM Opal Bill wrote:  Reason for CRM: Pt just left from an appt with Dr. Jonelle Neri. She mentioned now that she hasn't had any energy at all for a while now and wanting to see if the doctor can prescribe something for that or advise on something to take.  >> Sep 29, 2023  1:36 PM Ary Bitter R wrote: Pt called back stating that she had still not heard from anyone since 09/22/23. Advised that they tried calling but she gave her daughter's number. Please contact pt back about this issue of fatigue and also to discuss her urine culture results. Cb# [phone]

## 2023-09-30 ENCOUNTER — Other Ambulatory Visit: Payer: Self-pay | Admitting: Nurse Practitioner

## 2023-09-30 DIAGNOSIS — N3001 Acute cystitis with hematuria: Secondary | ICD-10-CM

## 2023-09-30 MED ORDER — SULFAMETHOXAZOLE-TRIMETHOPRIM 800-160 MG PO TABS: 1.0000 | ORAL_TABLET | Freq: Two times a day (BID) | ORAL | 0 refills | Status: DC

## 2023-09-30 NOTE — Telephone Encounter (Signed)
 Patient identified by name and date of birth.   Patient aware of response and voiced understanding.

## 2023-09-30 NOTE — Telephone Encounter (Signed)
 She can take vitamin B12 for energy or a Multivitamin  Bactrim  sent for UTI

## 2023-10-01 LAB — MULTIPLE MYELOMA PANEL, SERUM
Albumin SerPl Elph-Mcnc: 3.4 g/dL (ref 2.9–4.4)
Albumin/Glob SerPl: 0.8 (ref 0.7–1.7)
Alpha 1: 0.2 g/dL (ref 0.0–0.4)
Alpha2 Glob SerPl Elph-Mcnc: 0.7 g/dL (ref 0.4–1.0)
B-Globulin SerPl Elph-Mcnc: 0.9 g/dL (ref 0.7–1.3)
Gamma Glob SerPl Elph-Mcnc: 2.9 g/dL — ABNORMAL HIGH (ref 0.4–1.8)
Globulin, Total: 4.8 g/dL — ABNORMAL HIGH (ref 2.2–3.9)
IgA: 358 mg/dL — ABNORMAL HIGH (ref 87–352)
IgG (Immunoglobin G), Serum: 3579 mg/dL — ABNORMAL HIGH (ref 586–1602)
IgM (Immunoglobulin M), Srm: 237 mg/dL — ABNORMAL HIGH (ref 26–217)
Total Protein ELP: 8.2 g/dL (ref 6.0–8.5)

## 2023-10-18 ENCOUNTER — Emergency Department (HOSPITAL_COMMUNITY)
Admission: EM | Admit: 2023-10-18 | Discharge: 2023-10-18 | Disposition: A | Discharge status: Home / Self Care | Attending: Emergency Medicine | Admitting: Emergency Medicine

## 2023-10-18 ENCOUNTER — Other Ambulatory Visit: Payer: Self-pay

## 2023-10-18 ENCOUNTER — Emergency Department (HOSPITAL_COMMUNITY)

## 2023-10-18 ENCOUNTER — Encounter (HOSPITAL_COMMUNITY): Payer: Self-pay | Admitting: Emergency Medicine

## 2023-10-18 DIAGNOSIS — R1030 Lower abdominal pain, unspecified: Secondary | ICD-10-CM | POA: Insufficient documentation

## 2023-10-18 DIAGNOSIS — M51369 Other intervertebral disc degeneration, lumbar region without mention of lumbar back pain or lower extremity pain: Secondary | ICD-10-CM | POA: Diagnosis not present

## 2023-10-18 DIAGNOSIS — D649 Anemia, unspecified: Secondary | ICD-10-CM | POA: Insufficient documentation

## 2023-10-18 DIAGNOSIS — R3915 Urgency of urination: Secondary | ICD-10-CM | POA: Diagnosis not present

## 2023-10-18 DIAGNOSIS — R109 Unspecified abdominal pain: Secondary | ICD-10-CM

## 2023-10-18 DIAGNOSIS — I10 Essential (primary) hypertension: Secondary | ICD-10-CM | POA: Diagnosis not present

## 2023-10-18 DIAGNOSIS — I160 Hypertensive urgency: Secondary | ICD-10-CM | POA: Diagnosis not present

## 2023-10-18 DIAGNOSIS — M545 Low back pain, unspecified: Secondary | ICD-10-CM | POA: Diagnosis present

## 2023-10-18 DIAGNOSIS — R350 Frequency of micturition: Secondary | ICD-10-CM

## 2023-10-18 DIAGNOSIS — D72819 Decreased white blood cell count, unspecified: Secondary | ICD-10-CM | POA: Diagnosis not present

## 2023-10-18 DIAGNOSIS — M549 Dorsalgia, unspecified: Secondary | ICD-10-CM | POA: Diagnosis not present

## 2023-10-18 DIAGNOSIS — K5730 Diverticulosis of large intestine without perforation or abscess without bleeding: Secondary | ICD-10-CM | POA: Diagnosis not present

## 2023-10-18 LAB — CBC WITH DIFFERENTIAL/PLATELET
Abs Immature Granulocytes: 0.24 10*3/uL — ABNORMAL HIGH (ref 0.00–0.07)
Basophils Absolute: 0 10*3/uL (ref 0.0–0.1)
Basophils Relative: 0 %
Eosinophils Absolute: 0 10*3/uL (ref 0.0–0.5)
Eosinophils Relative: 0 %
HCT: 28.3 % — ABNORMAL LOW (ref 36.0–46.0)
Hemoglobin: 8.5 g/dL — ABNORMAL LOW (ref 12.0–15.0)
Immature Granulocytes: 9 %
Lymphocytes Relative: 46 %
Lymphs Abs: 1.3 10*3/uL (ref 0.7–4.0)
MCH: 29.5 pg (ref 26.0–34.0)
MCHC: 30 g/dL (ref 30.0–36.0)
MCV: 98.3 fL (ref 80.0–100.0)
Monocytes Absolute: 0.1 10*3/uL (ref 0.1–1.0)
Monocytes Relative: 4 %
Neutro Abs: 1.1 10*3/uL — ABNORMAL LOW (ref 1.7–7.7)
Neutrophils Relative %: 41 %
Platelets: 147 10*3/uL — ABNORMAL LOW (ref 150–400)
RBC: 2.88 MIL/uL — ABNORMAL LOW (ref 3.87–5.11)
RDW: 17.9 % — ABNORMAL HIGH (ref 11.5–15.5)
WBC: 2.7 10*3/uL — ABNORMAL LOW (ref 4.0–10.5)
nRBC: 0 % (ref 0.0–0.2)

## 2023-10-18 LAB — COMPREHENSIVE METABOLIC PANEL WITH GFR
ALT: 12 U/L (ref 0–44)
AST: 22 U/L (ref 15–41)
Albumin: 3.5 g/dL (ref 3.5–5.0)
Alkaline Phosphatase: 71 U/L (ref 38–126)
Anion gap: 5 (ref 5–15)
BUN: 11 mg/dL (ref 8–23)
CO2: 27 mmol/L (ref 22–32)
Calcium: 8.7 mg/dL — ABNORMAL LOW (ref 8.9–10.3)
Chloride: 103 mmol/L (ref 98–111)
Creatinine, Ser: 0.78 mg/dL (ref 0.44–1.00)
GFR, Estimated: >60 mL/min (ref >60)
Glucose, Bld: 107 mg/dL — ABNORMAL HIGH (ref 70–99)
Potassium: 3.8 mmol/L (ref 3.5–5.1)
Sodium: 135 mmol/L (ref 135–145)
Total Bilirubin: 0.5 mg/dL (ref 0.0–1.2)
Total Protein: 8.6 g/dL — ABNORMAL HIGH (ref 6.5–8.1)

## 2023-10-18 LAB — URINALYSIS, ROUTINE W REFLEX MICROSCOPIC
Bilirubin Urine: NEGATIVE
Glucose, UA: NEGATIVE mg/dL
Hgb urine dipstick: NEGATIVE
Ketones, ur: NEGATIVE mg/dL
Leukocytes,Ua: NEGATIVE
Nitrite: NEGATIVE
Protein, ur: NEGATIVE mg/dL
Specific Gravity, Urine: <1.005 — ABNORMAL LOW (ref 1.005–1.030)
pH: 7 (ref 5.0–8.0)

## 2023-10-18 MED ORDER — SODIUM CHLORIDE (PF) 0.9 % IJ SOLN
INTRAMUSCULAR | Status: AC
  Filled 2023-10-18: qty 50

## 2023-10-18 MED ORDER — TRAMADOL HCL 50 MG PO TABS: ORAL_TABLET | ORAL | 0 refills | Status: DC

## 2023-10-18 MED ORDER — SACUBITRIL-VALSARTAN 24-26 MG PO TABS
1.0000 | ORAL_TABLET | Freq: Once | ORAL | Status: AC
  Administered 2023-10-18: 1 via ORAL
  Filled 2023-10-18: qty 1

## 2023-10-18 MED ORDER — LABETALOL HCL 5 MG/ML IV SOLN
10.0000 mg | Freq: Once | INTRAVENOUS | Status: AC
  Administered 2023-10-18: 10 mg via INTRAVENOUS
  Filled 2023-10-18: qty 4

## 2023-10-18 MED ORDER — IOHEXOL 350 MG/ML SOLN
100.0000 mL | Freq: Once | INTRAVENOUS | Status: AC | PRN
Start: 2023-10-18 — End: 2023-10-18
  Administered 2023-10-18: 100 mL via INTRAVENOUS

## 2023-10-18 MED ORDER — HYDRALAZINE HCL 20 MG/ML IJ SOLN
10.0000 mg | Freq: Once | INTRAMUSCULAR | Status: AC
  Administered 2023-10-18: 10 mg via INTRAVENOUS
  Filled 2023-10-18: qty 1

## 2023-10-18 MED ORDER — CARVEDILOL 12.5 MG PO TABS: 25.0000 mg | ORAL_TABLET | Freq: Two times a day (BID) | ORAL | Status: DC

## 2023-10-18 MED ORDER — LIDOCAINE 5 % EX PTCH: 1.0000 | MEDICATED_PATCH | CUTANEOUS | 0 refills | Status: DC

## 2023-10-18 NOTE — ED Provider Notes (Signed)
 [place] EMERGENCY DEPARTMENT AT [facility] Provider Note   CSN: [id number] Arrival date & time: 10/18/23  0818     History  Chief Complaint  Patient presents with   Back Pain    Marie Church is a 68 y.o. female.  HPI Patient reports she had urgency and frequency of urination about 5 days ago.  She reports that it improved and resolved without any specific treatment.  She reports now however for couple of days she has had right lower back pain.  Is been fairly constant and sharp.  Patient reports she has had long standing problems with pain into her right leg from arthritis.  She does not perceive that the back pain is exacerbating or associated with what she considers chronic right leg pain.  No fevers no chills no vomiting.  Patient reports she had a UTI about a month ago and took antibiotics.  She reports the symptoms resolved.  Patient was last sexually active about 2 years ago.  Does not have any suspicion for STI exposure.    Home Medications Prior to Admission medications   Medication Sig Start Date End Date Taking? Authorizing Provider  lidocaine  (LIDODERM ) 5 % Place 1 patch onto the skin daily. Remove & Discard patch within 12 hours or as directed by MD 10/18/23  Yes Wynetta Heckle, MD  traMADol  (ULTRAM ) 50 MG tablet 1 to 2 tablets every 6 hours as needed for pain. 10/18/23  Yes Wynetta Heckle, MD  carvedilol  (COREG ) 12.5 MG tablet Take 1 tablet (12.5 mg total) by mouth 2 (two) times daily with a meal. 09/22/23   McClung, Stan Eans, PA-C  famotidine  (PEPCID ) 40 MG tablet Take 1 tablet (40 mg total) by mouth daily. 09/15/23   Fleming, Zelda W, NP  FARXIGA  10 MG TABS tablet Take 1 tablet (10 mg total) by mouth daily. 09/22/23   Hassie Lint, PA-C  sacubitril -valsartan  (ENTRESTO ) 24-26 MG Take 1 tablet by mouth 2 (two) times daily. 09/22/23   Hassie Lint, PA-C  spironolactone  (ALDACTONE ) 25 MG tablet Take 1 tablet (25 mg total) by mouth daily. 09/15/23    Fleming, Zelda W, NP  sulfamethoxazole -trimethoprim  (BACTRIM  DS) 800-160 MG tablet Take 1 tablet by mouth 2 (two) times daily. 09/30/23   Fleming, Zelda W, NP      Allergies    Doxycycline, Amoxicillin-pot clavulanate, and Lisinopril    Review of Systems   Review of Systems  Physical Exam Updated Vital Signs BP (!) 195/91 (BP Location: Left Arm)   Pulse 73   Temp 97.8 F (36.6 C) (Oral)   Resp 18   SpO2 99%  Physical Exam Constitutional:      Comments: Alert nontoxic no respiratory distress.  HENT:     Head: Normocephalic and atraumatic.     Mouth/Throat:     Pharynx: Oropharynx is clear.  Eyes:     Extraocular Movements: Extraocular movements intact.  Cardiovascular:     Rate and Rhythm: Normal rate and regular rhythm.  Pulmonary:     Effort: Pulmonary effort is normal.     Breath sounds: Normal breath sounds.  Abdominal:     General: There is no distension.     Palpations: Abdomen is soft.     Tenderness: There is no abdominal tenderness. There is no guarding.     Comments: No flank pain to percussion.  Some reproducible lower back pain to palpation no skin rashes or soft tissue abnormalities palpable  Musculoskeletal:  General: No swelling or tenderness. Normal range of motion.     Right lower leg: No edema.     Left lower leg: No edema.     Comments: No peripheral edema of the lower extremities.  Calves are soft and pliable.  Skin:    General: Skin is warm and dry.  Neurological:     General: No focal deficit present.     Mental Status: She is oriented to person, place, and time.     Motor: No weakness.     Coordination: Coordination normal.  Psychiatric:        Mood and Affect: Mood normal.     ED Results / Procedures / Treatments   Labs (all labs ordered are listed, but only abnormal results are displayed) Labs Reviewed  COMPREHENSIVE METABOLIC PANEL WITH GFR - Abnormal; Notable for the following components:      Result Value   Glucose, Bld 107  (*)    Calcium 8.7 (*)    Total Protein 8.6 (*)    All other components within normal limits  CBC WITH DIFFERENTIAL/PLATELET - Abnormal; Notable for the following components:   WBC 2.7 (*)    RBC 2.88 (*)    Hemoglobin 8.5 (*)    HCT 28.3 (*)    RDW 17.9 (*)    Platelets 147 (*)    Neutro Abs 1.1 (*)    Abs Immature Granulocytes 0.24 (*)    All other components within normal limits  URINALYSIS, ROUTINE W REFLEX MICROSCOPIC - Abnormal; Notable for the following components:   Specific Gravity, Urine <1.005 (*)    All other components within normal limits    EKG None  Radiology CT Angio Abd/Pel W and/or Wo Contrast Result Date: 10/18/2023 CLINICAL DATA:  Abdominal pain, abdominal aortic aneurysms EXAM: CTA ABDOMEN AND PELVIS WITHOUT AND WITH CONTRAST TECHNIQUE: Multidetector CT imaging of the abdomen and pelvis was performed using the standard protocol during bolus administration of intravenous contrast. Multiplanar reconstructed images and MIPs were obtained and reviewed to evaluate the vascular anatomy. RADIATION DOSE REDUCTION: This exam was performed according to the departmental dose-optimization program which includes automated exposure control, adjustment of the mA and/or kV according to patient size and/or use of iterative reconstruction technique. CONTRAST:  OMNIPAQUE  IOHEXOL  350 MG/ML SOLN COMPARISON:  October 18, 2023 CT renal, CT abdomen and pelvis July 29, 2022 FINDINGS: VASCULAR Aorta: Normal caliber aorta without aneurysm, dissection, vasculitis or significant stenosis. Celiac: Patent without evidence of aneurysm, dissection, vasculitis or significant stenosis. SMA: Patent without evidence of aneurysm, dissection, vasculitis or significant stenosis. Renals: Both renal arteries are patent without evidence of aneurysm, dissection, vasculitis, fibromuscular dysplasia or significant stenosis. IMA: Patent without evidence of aneurysm, dissection, vasculitis or significant stenosis.  Inflow: Patent without evidence of aneurysm, dissection, vasculitis or significant stenosis. Proximal Outflow: Bilateral common femoral and visualized portions of the superficial and profunda femoral arteries are patent without evidence of aneurysm, dissection, vasculitis or significant stenosis. Veins: No obvious venous abnormality within the limitations of this arterial phase study. Review of the MIP images confirms the above findings. 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 Stable since prior study. Hepatobiliary: No focal liver abnormality is seen. No gallstones, gallbladder wall thickening, or biliary dilatation. Pancreas: Unremarkable. No pancreatic ductal dilatation or surrounding inflammatory changes. Spleen: Normal in size without focal abnormality. Adrenals/Urinary Tract: Adrenal  glands are unremarkable. Kidneys are normal, without renal calculi, focal lesion, or hydronephrosis. Bladder is unremarkable. Stomach/Bowel: Stomach is within normal limits. Appendix appears normal. No evidence of bowel wall thickening, distention, or inflammatory changes. Lymphatic: No significant vascular findings are present. No enlarged abdominal or pelvic lymph nodes. Reproductive: Uterus and bilateral adnexa are unremarkable. Other: No abdominal wall hernia or abnormality. No abdominopelvic ascites. 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 IMPRESSION: *No evidence of abdominal aortic aneurysm or dissection. 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 Stable since prior study. 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 NON-VASCULAR Electronically Signed   By: Fredrich Jefferson M.D.   On: 10/18/2023 12:53   CT Renal Stone  Study Result Date: 10/18/2023 CLINICAL DATA:  Abdominal/flank pain, stone suspected Back pain for 5 days. Recently resolved abdominal pain. Recently treated for urinary tract infection. EXAM: CT ABDOMEN AND PELVIS WITHOUT CONTRAST TECHNIQUE: Multidetector CT imaging of the abdomen and pelvis was performed following the standard protocol without IV contrast. RADIATION DOSE REDUCTION: This exam was performed according to the departmental dose-optimization program which includes automated exposure control, adjustment of the mA and/or kV according to patient size and/or use of iterative reconstruction technique. COMPARISON:  Abdominopelvic CT 07/29/2022. FINDINGS: Lower chest: Grossly stable postsurgical changes in the right breast and radiation changes anteriorly at the right lung base. No significant pleural or pericardial effusion. Hepatobiliary: The liver appears unremarkable as imaged in the noncontrast state. No evidence of gallstones, gallbladder wall thickening or biliary dilatation. Pancreas: Unremarkable. No pancreatic ductal dilatation or surrounding inflammatory changes. Spleen: Normal in size without focal abnormality. Adrenals/Urinary Tract: Both adrenal glands appear normal. No evidence of urinary tract calculus, hydronephrosis or perinephric soft tissue stranding. No focal renal abnormalities are identified on noncontrast imaging. The bladder appears unremarkable for its degree of distention. Stomach/Bowel: No enteric contrast administered. The stomach appears unremarkable for its degree of distension. No evidence of bowel wall thickening, distention or surrounding inflammatory change. The appendix appears normal. Mildly prominent stool throughout the colon. Stable mild diverticular changes within the descending and sigmoid colon. Vascular/Lymphatic: There are no enlarged abdominal or pelvic lymph nodes. Small inguinal lymph nodes bilaterally appear unchanged. Mild aortic atherosclerosis without evidence  of aneurysm. Reproductive: The uterus and ovaries appear unremarkable. No evidence of adnexal mass. Other: No evidence of abdominal wall mass or hernia. No ascites or pneumoperitoneum. Musculoskeletal: No acute or significant osseous findings. Right rib sclerosis, likely related to prior radiation therapy. Lower lumbar spondylosis. IMPRESSION: 1. No acute findings or explanation for the patient's symptoms. No evidence of urinary tract calculus or hydronephrosis. 2. Stable postsurgical and radiation changes in the right breast and right lung base. 3.  Aortic Atherosclerosis (ICD10-I70.0). Electronically Signed   By: Elmon Hagedorn M.D.   On: 10/18/2023 10:37    Procedures Procedures   CRITICAL CARE Performed by: Wynetta Heckle   Total critical care time: 40 minutes  Critical care time was exclusive of separately billable procedures and treating other patients.  Critical care was necessary to treat or prevent imminent or life-threatening deterioration.  Critical care was time spent personally by me on the following activities: development of treatment plan with patient and/or surrogate as well as nursing, discussions with consultants, evaluation of patient's response to treatment, examination of patient, obtaining history from patient or surrogate, ordering and performing treatments and interventions, ordering and review of laboratory studies,  ordering and review of radiographic studies, pulse oximetry and re-evaluation of patient's condition.  Medications Ordered in ED Medications  carvedilol  (COREG ) tablet 25 mg (has no administration in time range)  sacubitril -valsartan  (ENTRESTO ) 24-26 mg per tablet (1 tablet Oral Given 10/18/23 1113)  hydrALAZINE  (APRESOLINE ) injection 10 mg (10 mg Intravenous Given 10/18/23 1113)  iohexol  (OMNIPAQUE ) 350 MG/ML injection 100 mL (100 mLs Intravenous Contrast Given 10/18/23 1158)  labetalol  (NORMODYNE ) injection 10 mg (10 mg Intravenous Given 10/18/23 1233)    ED  Course/ Medical Decision Making/ A&P                                 Medical Decision Making Amount and/or Complexity of Data Reviewed Labs: ordered. Radiology: ordered.  Risk Prescription drug management.   Patient presents as outlined.  She reports being treated for UTI 1 month ago.  She describes frequency and urgency that spontaneously resolved without treatment within about the past week.  She now has right flank pain.  Differential diagnosis includes UTI\pyelonephritis\kidney stone\musculoskeletal pain we will proceed with diagnostic evaluation.  Patient's blood pressure first documented was 197/109.  EMS blood pressure is documented as 158/82.  Will repeat and monitor.  Patient does not have any abdominal pain or central pain at this time that is suggestive of aortic aneurysm or dissection.  Will continue to monitor and administer home blood pressure medications.  CT noncontrast does not show any kidney stone or appearance of pyelonephritis.  No acute findings.  Urinalysis negative.  White count 2.7.  Hemoglobin 8.5.  GFR greater than 60.  Blood pressures remain significantly elevated in the 190s to 200 range.  Patient treated for hypertension.  She had not taken her medications for couple days.  Oral Entresto  and a dose of hydralazine  administered.  Was rechecked patient continued to have some significant elevated blood pressures.  Patient given a dose of labetalol  and Coreg  which she takes in the evenings.  Repeat blood pressure 13: 06 178/84.  At this time, diagnostic workup within normal limits: aneurysm and vascular dissection or stenosis are ruled out by CT scan.  Urinalysis negative and no supportive findings on CT for cystitis or pyelonephritis.  At this time we will treat for musculoskeletal pain.  We also extensively reviewed blood pressure management at home.  She reports she had had several days of missed medications.  Patient will plan for compliance with her medications  and consistent timing.  We discussed the possibility of needing dose increases if pressures are not reasonably controlled with her current doses.  Patient voices understanding.        Final Clinical Impression(s) / ED Diagnoses Final diagnoses:  Flank pain  Urinary frequency  Lower abdominal pain  Hypertensive urgency    Rx / DC Orders ED Discharge Orders          Ordered    traMADol  (ULTRAM ) 50 MG tablet        10/18/23 1308    lidocaine  (LIDODERM ) 5 %  Every 24 hours        10/18/23 1308              Wynetta Heckle, MD 10/18/23 1314

## 2023-10-18 NOTE — ED Triage Notes (Signed)
 Pt BIB [facility] from home, c/o back pain 10/10 x 5 days. Originated as abdominal pain but has since then resolved. Recently treated for UTI 2 weeks ago.  BP 158/82 P 79 SpO2 99%

## 2023-11-01 DIAGNOSIS — D170 Benign lipomatous neoplasm of skin and subcutaneous tissue of head, face and neck: Secondary | ICD-10-CM | POA: Diagnosis not present

## 2023-11-05 ENCOUNTER — Encounter: Attending: Physical Medicine & Rehabilitation | Admitting: Physical Medicine & Rehabilitation

## 2023-11-05 ENCOUNTER — Encounter: Payer: Self-pay | Admitting: Physical Medicine & Rehabilitation

## 2023-11-05 VITALS — BP 181/94 | HR 69 | Wt 202.4 lb

## 2023-11-05 DIAGNOSIS — M170 Bilateral primary osteoarthritis of knee: Secondary | ICD-10-CM | POA: Insufficient documentation

## 2023-11-05 DIAGNOSIS — Z5181 Encounter for therapeutic drug level monitoring: Secondary | ICD-10-CM | POA: Diagnosis not present

## 2023-11-05 DIAGNOSIS — G894 Chronic pain syndrome: Secondary | ICD-10-CM | POA: Insufficient documentation

## 2023-11-05 MED ORDER — HYDROCODONE-ACETAMINOPHEN 5-325 MG PO TABS: 1.0000 | ORAL_TABLET | Freq: Three times a day (TID) | ORAL | 0 refills | Status: DC | PRN

## 2023-11-05 NOTE — Progress Notes (Signed)
 Subjective:    Patient ID: Marie Church, female    DOB: [birth year], 68 y.o.   MRN: [id number]  HPI HPI  Marie Church is a 68 y.o. year old female  who  has a past medical history of Anemia, Anxiety, Arthritis, Bacteremia (07/31/2022), Breast cancer (HCC), Cataract, CHF (congestive heart failure) (HCC), DM type 2 (diabetes mellitus, type 2) (HCC), Hypertension, Port or reservoir infection (07/30/2022), Streptococcal bacteremia (07/30/2022), and Thrombocytopenia (HCC) (03/30/2021).   They are presenting to PM&R clinic as a new patient for pain management evaluation. They were referred by Dr. Rozelle Corning for treatment of b/l Knee OA  pain.  Patient has had pain in her knees bilaterally for over 10 years.  Pain is worse in her right knee than her left knee.  Denies any history of trauma to her knees or surgery to her knees.  Standing and walking worsens her pain.  She had a right knee injection, was on by Dr. Rozelle Corning 12/28/2022 for severe knee OA.  Dr. Rozelle Corning discussed TKA however patient not interested at this time.  She previously had chemotherapy and x-ray treatment for breast cancer.   Red flag symptoms: No red flags for back pain endorsed in Hx or ROS  Medications tried:  Topical medications Voltaren  doesn't help her- made it hurt worse Nsaids Ibuprofen  minimal benefit  Tylenol   Helps slightly  Opiates  Tramadol  helped her move around a little better Gabapentin  / Lyrica  - Denies  TCAs  - Denies  SNRIs  Duloxetine - denies  Other  - denies   Other treatments: PT- for her knees was helpful a few years ago  TENs unit - denies  Injections Cortisone knee injection  Surgery denies    Interval History 04/05/23 Patient is here for follow-up regarding her severe chronic knee pain.  Pain continues to be poorly controlled and makes it very difficult for her to ambulate.  Pain continues to be worse over the right knee compared to her left knee.  Tramadol  is no longer helping her pain and keep  it under control.  She reports last cortisone injection she had in her right knee helped for a few weeks.  Interval History 04/22/2023 Patient is here for bilateral knee injection with Zilretta .  She reports she has not had any knee injection in over 3 months.  She reports that Norco 5 has been helping keep her pain at a more tolerable level.  Denies any side effects with this medication.  Denies any new significant changes in her medical history.   Interval History 06/14/23 Pt reports zilretta  injections last visit was very beneficial. Injections are starting to wear off.  She continues to use Norco 5 with benefit, uses it only when pain is severe.  Last Rx lasted over 2 months.  No side effects with the medication.   Interval History 08/03/23 Patient reports Xarelto lasted for about 2 months after prior injection and then started to wear off.  She is here for repeat injection today.  She continues to use Norco 5 with reduction in her pain, she tries to use this sparingly only when pain is very severe.  She forgot to take her BP medications last night and this morning.  Interval History 11/05/23 Patient is here for her chronic pain follow-up.  Patient was wanting repeat bilateral injection with Zilretta  for her knees, reports this helped well last time.  Medications do not wear off.  She continues to use Norco 5 mg when pain is  particularly severe.  No side effects with the medication.  Pain Inventory Average Pain 9 Pain Right Now 7 My pain is intermittent and dull  In the last 24 hours, has pain interfered with the following? General activity 7 Relation with others 9 Enjoyment of life 7 What TIME of day is your pain at its worst? morning , daytime, evening, and night Sleep (in general) Good  Pain is worse with: walking, standing, and some activites Pain improves with: medication and injections Relief from Meds: 9       Family History  Problem Relation Age of Onset   Heart  failure Neg Hx    Heart disease Neg Hx    Heart attack Neg Hx    Colon polyps Neg Hx    Colon cancer Neg Hx    Esophageal cancer Neg Hx    Stomach cancer Neg Hx    Rectal cancer Neg Hx    Social History   Socioeconomic History   Marital status: Widowed    Spouse name: Not on file   Number of children: Not on file   Years of education: Not on file   Highest education level: Not on file  Occupational History   Not on file  Tobacco Use   Smoking status: Former    Current packs/day: 0.00    Types: Cigarettes    Start date: 44    Quit date: 2000    Years since quitting: 25.4   Smokeless tobacco: Never  Vaping Use   Vaping status: Never Used  Substance and Sexual Activity   Alcohol use: Not Currently   Drug use: Never   Sexual activity: Not on file  Other Topics Concern   Not on file  Social History Narrative   Not on file   Social Drivers of Health   Financial Resource Strain: Low Risk  (02/27/2022)   Received from [facility] [employer]   Overall Financial Resource Strain (CARDIA)    Difficulty of Paying Living Expenses: Not hard at all  Food Insecurity: No Food Insecurity (07/30/2022)   Hunger Vital Sign    Worried About Running Out of Food in the Last Year: Never true    Ran Out of Food in the Last Year: Never true  Transportation Needs: No Transportation Needs (07/30/2022)   PRAPARE - Administrator, Civil Service (Medical): No    Lack of Transportation (Non-Medical): No  Physical Activity: Not on file  Stress: Stress Concern Present (02/04/2021)   [employer] of Occupational Health - Occupational Stress Questionnaire    Feeling of Stress : To some extent  Social Connections: Unknown (07/14/2022)   Received from [facility]   Social Network    Social Network: Not on file   Past Surgical History:  Procedure Laterality Date   BREAST LUMPECTOMY Right 11/30/2020   BRONCHIAL BIOPSY  04/09/2021   Procedure: BRONCHIAL  BIOPSIES;  Surgeon: Gloriajean Large, MD;  Location: [facility];  Service: Pulmonary;;   BRONCHIAL NEEDLE ASPIRATION BIOPSY  04/09/2021   Procedure: BRONCHIAL NEEDLE ASPIRATION BIOPSIES;  Surgeon: Gloriajean Large, MD;  Location: [facility];  Service: Pulmonary;;   BRONCHIAL WASHINGS  04/09/2021   Procedure: BRONCHIAL WASHINGS;  Surgeon: Gloriajean Large, MD;  Location: [facility];  Service: Pulmonary;;   ENDOBRONCHIAL ULTRASOUND N/A 04/09/2021   Procedure: ENDOBRONCHIAL ULTRASOUND;  Surgeon: Gloriajean Large, MD;  Location: [facility];  Service: Pulmonary;  Laterality: N/A;   HEMOSTASIS CONTROL  04/09/2021  Procedure: HEMOSTASIS CONTROL;  Surgeon: Gloriajean Large, MD;  Location: [facility];  Service: Pulmonary;;   IR REMOVAL TUN ACCESS W/ PORT W/O FL MOD SED  07/30/2022   RIGHT/LEFT HEART CATH AND CORONARY ANGIOGRAPHY N/A 10/10/2020   Procedure: RIGHT/LEFT HEART CATH AND CORONARY ANGIOGRAPHY;  Surgeon: Mardell Shade, MD;  Location: [facility];  Service: Cardiovascular;  Laterality: N/A;   VIDEO BRONCHOSCOPY N/A 04/09/2021   Procedure: VIDEO BRONCHOSCOPY WITH FLUORO;  Surgeon: Gloriajean Large, MD;  Location: [facility];  Service: Pulmonary;  Laterality: N/A;   Past Medical History:  Diagnosis Date   Anemia    Anxiety    Arthritis    Bacteremia 07/31/2022   Breast cancer (HCC)    Cataract    CHF (congestive heart failure) (HCC)    Pt states issues is resolved   DM type 2 (diabetes mellitus, type 2) (HCC)    reportedly steroid induced   Hypertension    Port or reservoir infection 07/30/2022   Streptococcal bacteremia 07/30/2022   Thrombocytopenia (HCC) 03/30/2021   There were no vitals taken for this visit.  Opioid Risk Score:   Fall Risk Score:  `1  Depression screen [facility] 2/9     08/03/2023   12:39 PM 06/14/2023   10:09 AM 04/29/2023    9:32 AM 04/22/2023    2:31 PM 04/05/2023    3:25 PM 03/05/2023    9:34 AM 12/21/2022    2:16 PM   Depression screen PHQ 2/9  Decreased Interest 0 0 0 0 0 2 1  Down, Depressed, Hopeless 0 0 0 0 0 0 0  PHQ - 2 Score 0 0 0 0 0 2 1  Altered sleeping   0   0 0  Tired, decreased energy   3   3 2   Change in appetite   0   0 0  Feeling bad or failure about yourself    0   0 0  Trouble concentrating   0   0 0  Moving slowly or fidgety/restless   0   0 0  Suicidal thoughts   0   0 0  PHQ-9 Score   3   5 3   Difficult doing work/chores   Extremely dIfficult        Review of Systems  Constitutional: Negative.   HENT: Negative.    Eyes: Negative.   Respiratory: Negative.    Cardiovascular: Negative.   Gastrointestinal: Negative.   Endocrine: Negative.   Genitourinary: Negative.   Musculoskeletal:  Positive for gait problem.       Pain in both knees  Skin: Negative.   Allergic/Immunologic: Negative.   Hematological: Negative.   Psychiatric/Behavioral: Negative.    All other systems reviewed and are negative.      Objective:   Physical Exam   Gen: no distress, normal appearing HEENT: oral mucosa pink and moist, NCAT Chest: normal effort, normal rate of breathing Abd: soft, non-distended Ext: no edema Psych: pleasant, normal affect Skin: intact Neuro: alert and awake, follows commands RUE: 5/5 Deltoid, 5/5 Biceps, 5/5 Triceps, 5/5 5/5 Grip LUE: 5/5 Deltoid, 5/5 Biceps, 5/5 Triceps, 5/5 5/5 Grip RLE: HF 5/5, KE 5/5, ADF 5/5, APF 5/5 LLE: HF 5/5, KE 5/5, ADF 5/5, APF 5/5 Sensory exam normal for light touch and pain in all 4 limbs.   Musculoskeletal:  Bilateral knee joint line tenderness present + b/l knee crepitus with passive ROM Very antalgic gait  Assessment & Plan:   1) Chronic b/l Knee pain with b/l Knee OA Right greater than Left  2) Hx of breast cancer   -Discussed PT, She reports limited by transportation/schedule -Zynex, Knee brace ordered prior visit-reviewed how to put this on with patient today -Hold off on TENS due to prior cancer Hx -UDS and  pain agreement today -Tramadol  50mg  TID PRN-was discontinued prior visit -Continue Norco 5-325 TID PRN, ordered #90 -Continue UDS and pill counts.  Continue PDMP monitoring.  Pain contract completed prior visit. -Last refill of-Norco was about 2 months ago and she is out of medication -Consider Genicular nerve block  - BP elevated, BP 167/92, patient did not take her blood pressure medication yesterday or today.  Advised taking her medication at prescribed and monitoring BP closely for the next few weeks. -Pain is fairly constant consider Butrans patch next visit - Reports good benefit with Zilretta  prior visit, she would like repeat injection today.  Unfortunately we are holding off today because we need to verify with insurance.  Will schedule for next available knee injection with Zilretta  bilaterally  Warnings -10/28/2023 PDMP review indicates she picked up few days of tramadol  from different provider.  Appears this is from ER as she reports she did not use it and will bring it in.  Verbal warning provided.

## 2023-11-12 ENCOUNTER — Inpatient Hospital Stay

## 2023-11-12 ENCOUNTER — Inpatient Hospital Stay: Admitting: Oncology

## 2023-11-12 ENCOUNTER — Encounter: Payer: Self-pay | Admitting: Physical Medicine & Rehabilitation

## 2023-11-12 ENCOUNTER — Encounter (HOSPITAL_BASED_OUTPATIENT_CLINIC_OR_DEPARTMENT_OTHER): Admitting: Physical Medicine & Rehabilitation

## 2023-11-12 ENCOUNTER — Telehealth: Payer: Self-pay | Admitting: Oncology

## 2023-11-12 VITALS — BP 161/82 | HR 69 | Ht 66.0 in | Wt 202.8 lb

## 2023-11-12 DIAGNOSIS — Z5181 Encounter for therapeutic drug level monitoring: Secondary | ICD-10-CM | POA: Diagnosis not present

## 2023-11-12 DIAGNOSIS — M953 Acquired deformity of neck: Secondary | ICD-10-CM | POA: Diagnosis not present

## 2023-11-12 DIAGNOSIS — M170 Bilateral primary osteoarthritis of knee: Secondary | ICD-10-CM | POA: Diagnosis not present

## 2023-11-12 DIAGNOSIS — R221 Localized swelling, mass and lump, neck: Secondary | ICD-10-CM | POA: Diagnosis not present

## 2023-11-12 DIAGNOSIS — G894 Chronic pain syndrome: Secondary | ICD-10-CM | POA: Diagnosis not present

## 2023-11-12 MED ORDER — TRIAMCINOLONE ACETONIDE 32 MG IX SRER
32.0000 mg | Freq: Once | INTRA_ARTICULAR | Status: AC
Start: 2023-11-12 — End: 2023-11-12
  Administered 2023-11-12: 32 mg via INTRA_ARTICULAR

## 2023-11-12 MED ORDER — TRIAMCINOLONE ACETONIDE 32 MG IX SRER
32.0000 mg | Freq: Once | INTRA_ARTICULAR | Status: AC
  Administered 2023-11-12: 32 mg via INTRA_ARTICULAR

## 2023-11-12 NOTE — Telephone Encounter (Signed)
 Spoke with patient confirming upcoming appointment

## 2023-11-12 NOTE — Progress Notes (Signed)
 Subjective:    Patient ID: Marie Church, female    DOB: May 22, 1955, 68 y.o.   MRN: [id number]  HPI HPI  Marie Church is a 68 y.o. year old female  who  has a past medical history of Anemia, Anxiety, Arthritis, Bacteremia (07/31/2022), Breast cancer (HCC), Cataract, CHF (congestive heart failure) (HCC), DM type 2 (diabetes mellitus, type 2) (HCC), Hypertension, Port or reservoir infection (07/30/2022), Streptococcal bacteremia (07/30/2022), and Thrombocytopenia (HCC) (03/30/2021).   They are presenting to PM&R clinic as a new patient for pain management evaluation. They were referred by Dr. Addie for treatment of b/l Knee OA  pain.  Patient has had pain in her knees bilaterally for over 10 years.  Pain is worse in her right knee than her left knee.  Denies any history of trauma to her knees or surgery to her knees.  Standing and walking worsens her pain.  She had a right knee injection, was on by Dr. Addie 12/28/2022 for severe knee OA.  Dr. Addie discussed TKA however patient not interested at this time.  She previously had chemotherapy and x-ray treatment for breast cancer.   Red flag symptoms: No red flags for back pain endorsed in Hx or ROS  Medications tried:  Topical medications Voltaren  doesn't help her- made it hurt worse Nsaids Ibuprofen  minimal benefit  Tylenol   Helps slightly  Opiates  Tramadol  helped her move around a little better Gabapentin  / Lyrica  - Denies  TCAs  - Denies  SNRIs  Duloxetine - denies  Other  - denies   Other treatments: PT- for her knees was helpful a few years ago  TENs unit - denies  Injections Cortisone knee injection  Surgery denies    Interval History 04/05/23 Patient is here for follow-up regarding her severe chronic knee pain.  Pain continues to be poorly controlled and makes it very difficult for her to ambulate.  Pain continues to be worse over the right knee compared to her left knee.  Tramadol  is no longer helping her pain and keep  it under control.  She reports last cortisone injection she had in her right knee helped for a few weeks.  Interval History 04/22/2023 Patient is here for bilateral knee injection with Zilretta .  She reports she has not had any knee injection in over 3 months.  She reports that Norco 5 has been helping keep her pain at a more tolerable level.  Denies any side effects with this medication.  Denies any new significant changes in her medical history.   Interval History 06/14/23 Pt reports zilretta  injections last visit was very beneficial. Injections are starting to wear off.  She continues to use Norco 5 with benefit, uses it only when pain is severe.  Last Rx lasted over 2 months.  No side effects with the medication.   Interval History 08/03/23 Patient reports Xarelto lasted for about 2 months after prior injection and then started to wear off.  She is here for repeat injection today.  She continues to use Norco 5 with reduction in her pain, she tries to use this sparingly only when pain is very severe.  She forgot to take her BP medications last night and this morning.  Interval History 11/05/23 Patient is here for her chronic pain follow-up.  Patient was wanting repeat bilateral injection with Zilretta  for her knees, reports this helped well last time.  Medications do not wear off.  She continues to use Norco 5 mg when pain is  particularly severe.  No side effects with the medication.  Interval History 11/05/23 Pt continues to have pain in both knees. She is here for zilretta  injections to both knees today.  She has just picked up last refill of norco. Norco has been helping her pain, no side effects.    Pain Inventory Average Pain 9 Pain Right Now 7 My pain is intermittent and dull  In the last 24 hours, has pain interfered with the following? General activity 7 Relation with others 9 Enjoyment of life 7 What TIME of day is your pain at its worst? morning , daytime, evening, and  night Sleep (in general) Good  Pain is worse with: walking, standing, and some activites Pain improves with: medication and injections Relief from Meds: 9       Family History  Problem Relation Age of Onset   Heart failure Neg Hx    Heart disease Neg Hx    Heart attack Neg Hx    Colon polyps Neg Hx    Colon cancer Neg Hx    Esophageal cancer Neg Hx    Stomach cancer Neg Hx    Rectal cancer Neg Hx    Social History   Socioeconomic History   Marital status: Widowed    Spouse name: Not on file   Number of children: Not on file   Years of education: Not on file   Highest education level: Not on file  Occupational History   Not on file  Tobacco Use   Smoking status: Former    Current packs/day: 0.00    Types: Cigarettes    Start date: 68    Quit date: 2000    Years since quitting: 25.5   Smokeless tobacco: Never  Vaping Use   Vaping status: Never Used  Substance and Sexual Activity   Alcohol use: Not Currently   Drug use: Never   Sexual activity: Not on file  Other Topics Concern   Not on file  Social History Narrative   Not on file   Social Drivers of Health   Financial Resource Strain: Low Risk  (02/27/2022)   Received from [facility]   Overall Financial Resource Strain (CARDIA)    Difficulty of Paying Living Expenses: Not hard at all  Food Insecurity: No Food Insecurity (07/30/2022)   Hunger Vital Sign    Worried About Running Out of Food in the Last Year: Never true    Ran Out of Food in the Last Year: Never true  Transportation Needs: No Transportation Needs (07/30/2022)   PRAPARE - Administrator, Civil Service (Medical): No    Lack of Transportation (Non-Medical): No  Physical Activity: Not on file  Stress: Stress Concern Present (02/04/2021)   [employer] of Occupational Health - Occupational Stress Questionnaire    Feeling of Stress : To some extent  Social Connections: Unknown (07/14/2022)    Received from [facility]   Social Network    Social Network: Not on file   Past Surgical History:  Procedure Laterality Date   BREAST LUMPECTOMY Right 11/30/2020   BRONCHIAL BIOPSY  04/09/2021   Procedure: BRONCHIAL BIOPSIES;  Surgeon: Gladis Leonor HERO, MD;  Location: [facility];  Service: Pulmonary;;   BRONCHIAL NEEDLE ASPIRATION BIOPSY  04/09/2021   Procedure: BRONCHIAL NEEDLE ASPIRATION BIOPSIES;  Surgeon: Gladis Leonor HERO, MD;  Location: [facility];  Service: Pulmonary;;   BRONCHIAL WASHINGS  04/09/2021   Procedure: BRONCHIAL WASHINGS;  Surgeon: Gladis Leonor  CHRISTELLA, MD;  Location: [facility];  Service: Pulmonary;;   ENDOBRONCHIAL ULTRASOUND N/A 04/09/2021   Procedure: ENDOBRONCHIAL ULTRASOUND;  Surgeon: Gladis Leonor CHRISTELLA, MD;  Location: [facility];  Service: Pulmonary;  Laterality: N/A;   HEMOSTASIS CONTROL  04/09/2021   Procedure: HEMOSTASIS CONTROL;  Surgeon: Gladis Leonor CHRISTELLA, MD;  Location: [facility];  Service: Pulmonary;;   IR REMOVAL TUN ACCESS W/ PORT W/O FL MOD SED  07/30/2022   RIGHT/LEFT HEART CATH AND CORONARY ANGIOGRAPHY N/A 10/10/2020   Procedure: RIGHT/LEFT HEART CATH AND CORONARY ANGIOGRAPHY;  Surgeon: Cherrie Toribio SAUNDERS, MD;  Location: [facility];  Service: Cardiovascular;  Laterality: N/A;   VIDEO BRONCHOSCOPY N/A 04/09/2021   Procedure: VIDEO BRONCHOSCOPY WITH FLUORO;  Surgeon: Gladis Leonor CHRISTELLA, MD;  Location: [facility];  Service: Pulmonary;  Laterality: N/A;   Past Medical History:  Diagnosis Date   Anemia    Anxiety    Arthritis    Bacteremia 07/31/2022   Breast cancer (HCC)    Cataract    CHF (congestive heart failure) (HCC)    Pt states issues is resolved   DM type 2 (diabetes mellitus, type 2) (HCC)    reportedly steroid induced   Hypertension    Port or reservoir infection 07/30/2022   Streptococcal bacteremia 07/30/2022   Thrombocytopenia (HCC) 03/30/2021   There were no vitals taken for this visit.  Opioid Risk  Score:   Fall Risk Score:  `1  Depression screen [facility] 2/9     11/05/2023   10:38 AM 08/03/2023   12:39 PM 06/14/2023   10:09 AM 04/29/2023    9:32 AM 04/22/2023    2:31 PM 04/05/2023    3:25 PM 03/05/2023    9:34 AM  Depression screen PHQ 2/9  Decreased Interest 0 0 0 0 0 0 2  Down, Depressed, Hopeless 0 0 0 0 0 0 0  PHQ - 2 Score 0 0 0 0 0 0 2  Altered sleeping 0   0   0  Tired, decreased energy 0   3   3  Change in appetite 0   0   0  Feeling bad or failure about yourself  0   0   0  Trouble concentrating 0   0   0  Moving slowly or fidgety/restless 0   0   0  Suicidal thoughts 0   0   0  PHQ-9 Score 0   3   5  Difficult doing work/chores    Extremely dIfficult       Review of Systems  Constitutional: Negative.   HENT: Negative.    Eyes: Negative.   Respiratory: Negative.    Cardiovascular: Negative.   Gastrointestinal: Negative.   Endocrine: Negative.   Genitourinary: Negative.   Musculoskeletal:  Positive for gait problem.       Pain in both knees  Skin: Negative.   Allergic/Immunologic: Negative.   Hematological: Negative.   Psychiatric/Behavioral: Negative.    All other systems reviewed and are negative.      Objective:   Physical Exam   Gen: no distress, normal appearing HEENT: oral mucosa pink and moist, NCAT Chest: normal effort, normal rate of breathing Abd: soft, non-distended Ext: no edema Psych: pleasant, normal affect Skin: intact Neuro: alert and awake, follows commands RUE: moving all 4 to gravity and resistance Sensory exam normal for light touch and pain in all 4 limbs.   Musculoskeletal:  Bilateral knee joint line tenderness present, no signs of infection  around knees + b/l knee crepitus with passive ROM Very antalgic gait        Assessment & Plan:   1) Chronic b/l Knee pain with b/l Knee OA Right greater than Left  2) Hx of breast cancer  3) HTN  -Discussed PT, She reports limited by transportation/schedule -Zynex, Knee  brace ordered prior visit -Hold off on TENS due to prior cancer Hx -UDS and pain agreement today -Tramadol  50mg  TID PRN-was discontinued prior visit -Continue Norco 5-325 TID PRN -Continue UDS and pill counts.  Continue PDMP monitoring.  Pain contract completed prior visit. -Consider Genicular nerve block  -Advised monitoring BP and taking BP medications as directed.  BP elevated but improved after a few minutes on recheck, Advised f/u with PCP or ER if severely elevated.  -Pain is fairly constant consider Butrans patch  at later visit   Knee injection today with Zilretta  bilateral   Indication:Knee pain not relieved by medication management and other conservative care.   Informed consent was obtained after describing risks and benefits of the procedure with the patient, this includes bleeding, bruising, infection and medication side effects. The patient wishes to proceed and has given written consent. The patient was placed in a recumbent position. The Lateral aspect of the knee was marked and prepped with Betadine and alcohol. It was then entered with a 21-gauge 1-1/2 inch needle.  After negative draw back for blood, a solution containing 5 ML of 32 mg Zilretta  solution was injected. The patient tolerated the procedure well. Post procedure instructions were given.    Procedure was completed for both knees  Warnings -10/28/2023 PDMP review indicates she picked up few days of tramadol  from different provider.  Appears this is from ER as she reports she did not use it and will bring it in.  Verbal warning provided.

## 2023-11-24 ENCOUNTER — Inpatient Hospital Stay: Admitting: Oncology

## 2023-11-24 ENCOUNTER — Inpatient Hospital Stay

## 2023-12-20 ENCOUNTER — Other Ambulatory Visit: Payer: Self-pay | Admitting: Physician Assistant

## 2023-12-20 DIAGNOSIS — I509 Heart failure, unspecified: Secondary | ICD-10-CM

## 2023-12-20 DIAGNOSIS — I10 Essential (primary) hypertension: Secondary | ICD-10-CM

## 2023-12-21 ENCOUNTER — Other Ambulatory Visit: Payer: Self-pay | Admitting: Family Medicine

## 2023-12-21 DIAGNOSIS — I509 Heart failure, unspecified: Secondary | ICD-10-CM

## 2023-12-21 DIAGNOSIS — I10 Essential (primary) hypertension: Secondary | ICD-10-CM

## 2023-12-22 NOTE — Telephone Encounter (Signed)
 Pt unconfirmed appt 8/6

## 2023-12-24 ENCOUNTER — Ambulatory Visit: Admitting: Nurse Practitioner

## 2023-12-30 DIAGNOSIS — H25812 Combined forms of age-related cataract, left eye: Secondary | ICD-10-CM | POA: Diagnosis not present

## 2024-01-03 ENCOUNTER — Encounter: Attending: Physical Medicine & Rehabilitation | Admitting: Physical Medicine & Rehabilitation

## 2024-01-03 DIAGNOSIS — Z5181 Encounter for therapeutic drug level monitoring: Secondary | ICD-10-CM | POA: Insufficient documentation

## 2024-01-03 DIAGNOSIS — M170 Bilateral primary osteoarthritis of knee: Secondary | ICD-10-CM | POA: Insufficient documentation

## 2024-01-03 DIAGNOSIS — G894 Chronic pain syndrome: Secondary | ICD-10-CM | POA: Insufficient documentation

## 2024-01-04 ENCOUNTER — Other Ambulatory Visit: Payer: Self-pay | Admitting: Otolaryngology

## 2024-01-04 NOTE — Telephone Encounter (Addendum)
 Scheduled surgery at [facility] on 01/12/24  Email sent to: [email or web address]  Ms. Marie Church   It was a pleasure speaking with you. As per our phone conversation, your surgical procedure with Dr. Luciano will be on Wednesday 01/12/24 at:   [facility] [address] [place], [place] [address] [phone]   Surgery is tentatively scheduled to start at 2:15 pm (this time may change due to cancellations or add-ons for the day of surgery) The pre-op team will provide you with the actual arrival time which should be at 12:15 pm.   Someone from the [facility]'s anesthesia team will be in contact with you to get your medical history and provide you with special instructions prior to the surgery date. If you have not received a call from them at least two days before the surgery, please contact the number above.   Please remember nothing to eat or drink after midnight unless otherwise authorized by the pre-op anesthesia team. Also, keep in mind all instructions given to you concerning stopping or starting any medications you are taking. Failure to adhere to any special instruction may result in your surgery being canceled.   Your post-op appointment with Dr. Luciano is on 01/18/24 at 11:30 am. Please arrive 15 minutes early for check-in   Please call Dr. Vella assistant [place] or [place] at [phone] after surgery if:         You have any pharmacy issues or your pharmacy has changed since the last office visit          You need FMLA paperwork filled out or a doctor's note          You have any medical questions pertaining to your surgery          You have any post-surgical concerns or complications          You need to reschedule a post-op appointment   See attachments    Best Regards,   Marie Church Surgical Coordinator   [facility] [place] [address] [place], [place]  [address] Phone  [phone]/ Fax [phone]

## 2024-01-06 ENCOUNTER — Telehealth: Payer: Self-pay | Admitting: Oncology

## 2024-01-06 NOTE — Telephone Encounter (Signed)
 Voicemail not set up, rescheduled appt.

## 2024-01-10 ENCOUNTER — Telehealth: Payer: Self-pay | Admitting: Physical Medicine & Rehabilitation

## 2024-01-10 NOTE — Telephone Encounter (Signed)
 P needs refill of hydrocodone   [facility] [place]

## 2024-01-11 ENCOUNTER — Encounter (HOSPITAL_COMMUNITY): Payer: Self-pay | Admitting: Otolaryngology

## 2024-01-11 NOTE — Anesthesia Preprocedure Evaluation (Signed)
 Anesthesia Evaluation    Airway        Dental   Pulmonary former smoker          Cardiovascular hypertension,      Neuro/Psych    GI/Hepatic   Endo/Other  diabetes    Renal/GU      Musculoskeletal   Abdominal   Peds  Hematology   Anesthesia Other Findings   Reproductive/Obstetrics                              Anesthesia Physical Anesthesia Plan  ASA:   Anesthesia Plan:    Post-op Pain Management:    Induction:   PONV Risk Score and Plan:   Airway Management Planned:   Additional Equipment:   Intra-op Plan:   Post-operative Plan:   Informed Consent:   Plan Discussed with:   Anesthesia Plan Comments: (PAT note by Lynwood Hope, PA-C: 68 year old female with pertinent history including former smoker (quit 2000), stage IIb right-sided breast cancer diagnosed in November 2021 (s/p neoadjuvant TCHP and 11/30/20 right partial mastectomy, complete right axillary lymph node dissection), hypertension, chronic thrombocytopenia, anemia, and leukopenia, CHF from chemotherapy, which resolved.  She was admitted to the hospital in May 2022 for worsening shortness of breath and lower extremity edema.  Echo showed EF 25 to 30%.  Cath 10/10/2020 with normal coronaries.  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  EF subsequently recovered.  Most recent echo 07/31/2022 showed LVEF 55 to 60%, normal RV, no significant valvular abnormalities.  Continues to follow with hematology/oncology for anemia, leukopenia, thrombocytopenia.  Most recent CBC 10/18/2023 with WBC 2.7, hemoglobin 8.5, platelets 147.  BMP at that time was unremarkable.  GlideScope was used electively for intubation 04/09/2021 due to, multiple loose teeth present, poor mouth opening, and posterior neck mass.  She will need day of surgery labs and evaluation.  EKG 03/06/2023: Sinus tachycardia.  Rate 108.  Ventricular premature complex. Aberrant complex  TTE 07/31/2022: 1. Left ventricular ejection fraction, by estimation, is 55 to 60%. The  left ventricle has normal function. The left ventricle has no regional  wall motion abnormalities. There is mild concentric left ventricular  hypertrophy. Left ventricular diastolic  parameters were normal.   2. Right ventricular systolic function is normal. The right ventricular  size is normal. Tricuspid regurgitation signal is inadequate for assessing  PA pressure.   3. The mitral valve is normal in structure. Trivial mitral valve  regurgitation. No evidence of mitral stenosis.   4. The aortic valve is tricuspid. Aortic valve regurgitation is not  visualized. No aortic stenosis is present.   5. The inferior vena cava is dilated in size with >50% respiratory  variability, suggesting right atrial pressure of 8 mmHg.   Comparison(s): Prior images reviewed side by side. Changes from prior  study are noted. EF improved compared to prior.   )         Anesthesia Quick Evaluation

## 2024-01-11 NOTE — Progress Notes (Signed)
 Anesthesia Chart Review: Same day workup  68 year old female with pertinent history including former smoker (quit 2000), stage IIb right-sided breast cancer diagnosed in November 2021 (s/p neoadjuvant TCHP and 11/30/20 right partial mastectomy, complete right axillary lymph node dissection), hypertension, chronic thrombocytopenia, anemia, and leukopenia, CHF from chemotherapy, which resolved.  She was admitted to the hospital in May 2022 for worsening shortness of breath and lower extremity edema.  Echo showed EF 25 to 30%.  Cath 10/10/2020 with normal coronaries.  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  EF subsequently recovered.  Most recent echo 07/31/2022 showed LVEF 55 to 60%, normal RV, no significant valvular abnormalities.  Continues to follow with hematology/oncology for anemia, leukopenia, thrombocytopenia.  Most recent CBC 10/18/2023 with WBC 2.7, hemoglobin 8.5, platelets 147.  BMP at that time was unremarkable.  GlideScope was used electively for intubation 04/09/2021 due to, multiple loose teeth present, poor mouth opening, and posterior neck mass.  She will need day of surgery labs and evaluation.  EKG 03/06/2023: Sinus tachycardia.  Rate 108. Ventricular premature complex. Aberrant complex  TTE 07/31/2022: 1. Left ventricular ejection fraction, by estimation, is 55 to 60%. The  left ventricle has normal function. The left ventricle has no regional  wall motion abnormalities. There is mild concentric left ventricular  hypertrophy. Left ventricular diastolic  parameters were normal.   2. Right ventricular systolic function is normal. The right ventricular  size is normal. Tricuspid regurgitation signal is inadequate for assessing  PA pressure.   3. The mitral valve is normal in structure. Trivial mitral valve  regurgitation. No evidence of mitral stenosis.   4. The aortic valve is tricuspid. Aortic valve regurgitation is not  visualized. No  aortic stenosis is present.   5. The inferior vena cava is dilated in size with >50% respiratory  variability, suggesting right atrial pressure of 8 mmHg.   Comparison(s): Prior images reviewed side by side. Changes from prior  study are noted. EF improved compared to prior.      Lynwood Geofm RIGGERS [facility] Short Stay Center/Anesthesiology Phone [phone] 01/11/2024 11:20 AM

## 2024-01-11 NOTE — Progress Notes (Signed)
 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   History includes former smoker (quit 2000), HTN, pre-diabetes (by A1c 10/04/20; A1c normal 5.3% 08/20/22), right breast cancer (diagnosed in [place] in 03/2020; Stage IIB, cT2N1M0 grade 2, HER2 IDC; Port-a-cath 04/03/2020-07/30/2022; s/p chemotherapy complicated by suspected Herceptin related cardiomyopathy/HFrEF 09/2020; s/p right partial mastectomy, complete right axillary LN dissection 11/30/2020 with margin re-excision 12/27/2020; s/p radiation 02/19/21-04/22/21; Kadcyla discontinued 06/20/2021 due to thrombocytopenia), chronic systolic CHF (likely chemo/Herceptin-inducted CM, normal coronaries 09/2020, NICM (09/2020), Streptococcal bacteremia (07/2021, Port removed, repeat blood culture showed no growth, TTE did not suggest endocarditis), leukopenia, EBUS bronchoscopy (04/09/21 in setting of multi-focal PNA while undergoing chemoradiation).  Glidescope used 04/09/2021 d/t multiple loose teeth present, poor mouth opening, and posterior neck mass.  Last cardiology visit was on 01/17/2023 with Janene Boer, PA for routine follow-up. She was diagnosed with acute systolic CHF(EF 25-30%) in May 2022 while on Herceptin for breast cancer. Cardiac cath showed normal coronaries. Herceptin discontinued due to suspected chemo-related cardiomyopathy. LVEF ultimately recovered. Last LVEF 55-60% in March 2024. She appear euvolemic on exam, but reported some dyspnea and chills.  Lungs were clear. OE, so D-dimer and BNP were drawn which were normal. Creatinine 0.75 and H/H stable at 10.4/32.9. He cleared her for cataract extraction.    HEM-ONC is Dr. Autumn, last visit 09/24/2023. He wrote, Leukopenia with a decrease in white blood cell count from 3400 to 2900 cells/L. No signs of infection such as fever, chills, or respiratory symptoms. Possible causes include recent urinary tract infection or stressors affecting bone marrow function. Past chemotherapy may also be a contributing factor. He will consider bone marrow biopsy if WBC continues to decline. HGB then 8.9, MCV 91.91, PLT 284, WBC 2900 with ANC of 1400. Breast cancer was considered in remission at that time. Also has chronic, slightly elevated total protein. Labs and clinical picture not felt consistent with monoclonal gammopathy, but rather polyclonal increase in proteins for uncertain etiology. He added ESR, CRP, ANA, RF and repeat myeloma labs then to discuss at her next visit in ~ 6 weeks.    She is a same day work-up, so anesthesia team to evaluate on the day of surgery. Last WBC 2.7 and H/H 8.5/28.3 (previous WBC 2.9, H/H  8.9/27.5 at [facility]) with known history of anemia/leukopenia as discussed above.     VS:  Wt Readings from Last 3 Encounters:  11/12/23 92 kg  11/05/23 91.8 kg  09/24/23 91.1 kg   BP Readings from Last 3 Encounters:  11/12/23 (!) 161/82  11/05/23 (!) 181/94  10/18/23 (!) 148/82   Pulse Readings from Last 3 Encounters:  11/12/23 69  11/05/23 69  10/18/23 73    PROVIDERS: Theotis Haze ORN, NP is PCP  Autumn Millman, MD  Dewey Rush, MD is RAD-ONC Francyne Headland, MD is cardiologist   LABS: For day of surgery. Last labs in [facility] include: Lab  Results  Component Value Date   WBC 2.7 (L) 10/18/2023   HGB 8.5 (L) 10/18/2023   HCT 28.3 (L) 10/18/2023   PLT 147 (L) 10/18/2023   GLUCOSE 107 (H) 10/18/2023   CHOL 92 (L) 12/21/2022   TRIG 50 12/21/2022   HDL 41 12/21/2022   LDLCALC  39 12/21/2022   ALT 12 10/18/2023   AST 22 10/18/2023   NA 135 10/18/2023   K 3.8 10/18/2023   CL 103 10/18/2023   CREATININE 0.78 10/18/2023   BUN 11 10/18/2023   CO2 27 10/18/2023   TSH 0.843 07/30/2022   INR 1.0 06/06/2022   HGBA1C 5.3 08/20/2022     IMAGES: CTA Abd/pelvis 10/18/2023: IMPRESSION: *No evidence of abdominal aortic aneurysm or dissection. 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 Stable since prior study. 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   CT Renal Stone Study 10/18/2023:  IMPRESSION: 1. No acute findings or explanation for the patient's symptoms. No evidence of urinary tract calculus or hydronephrosis. 2. Stable postsurgical and radiation changes in the right breast and right lung base. 3.  Aortic Atherosclerosis (ICD10-I70.0).   EKG: 03/06/2023:  Sinus tachycardia at 104 bpm Ventricular premature complex Aberrant complex Confirmed by Kommor, [place] ([id number]) on 03/07/2023 11:46:20 AM   CV: Echo 07/31/2022: IMPRESSIONS   1. Left ventricular ejection fraction, by estimation, is 55 to 60%. The  left ventricle has normal function. The left ventricle has no regional  wall motion abnormalities. There is mild concentric left ventricular  hypertrophy. Left ventricular diastolic  parameters were normal.   2. Right ventricular systolic function is normal. The right ventricular  size is normal. Tricuspid regurgitation signal is inadequate for assessing  PA pressure.   3. The mitral valve is normal in structure. Trivial mitral valve  regurgitation. No evidence of mitral stenosis.   4. The aortic valve is tricuspid. Aortic valve regurgitation is not  visualized. No aortic stenosis is present.   5. The inferior vena cava is dilated in size with >50% respiratory  variability, suggesting right atrial pressure of 8 mmHg.  -  Comparison(s): Prior images reviewed side by side. Changes from prior  study are noted. EF improved compared to prior.  - Conclusion(s)/Recommendation(s): No evidence of valvular vegetations on  this transthoracic echocardiogram. Consider a transesophageal  echocardiogram to exclude infective endocarditis if clinically indicated.  - Comparison 01/23/2022 ([facility]): LVEF 60%, grade 1 DD, no RWMA; 09/19/2021 ([facility]): LVEF 66%; 03/03/2021: LVEF 50%, no RWMA, normal RV systolic function, trivial MR ; 9/12/022 ([facility]): LVEF 62%; 11/04/2020 ([facility]): LVEF 42%; 10/04/2020: LVEF 25-30% with global hypokinesis, moderate concentric LVH, mildly reduced RV systolic function, mild-moderate MR   RHC/LHC 10/10/2020: Findings: Ao = 110/67 (87) LV = 115/19 RA = 5 RV = 43/8 PA = 43/18 (27) PCW = 12 Fick cardiac output/index = 8.2/4.2 PVR = 1.8 WU FA sat = 99% PA sat = 73%, 75%   Assessment: 1. Normal cors 2. 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 Relatively well compensated hemodynamics with high cardiac output   Plan/Discussion: Stop IV lasix . Switch to po. Continue to titrate GDMT. Likely can d/c soon.    Past Medical History:  Diagnosis Date   Anemia    Anxiety    Arthritis    Bacteremia 07/31/2022   Breast cancer (HCC)    Cataract    CHF (congestive heart failure) (HCC)    Pt  states issues is resolved   DM type 2 (diabetes mellitus, type 2) (HCC)    reportedly steroid induced   Hypertension    Port or reservoir infection 07/30/2022   Streptococcal bacteremia 07/30/2022   Thrombocytopenia (HCC) 03/30/2021    Past Surgical History:  Procedure Laterality Date   BREAST LUMPECTOMY Right 11/30/2020   BRONCHIAL BIOPSY  04/09/2021   Procedure: BRONCHIAL BIOPSIES;  Surgeon: Gladis Leonor HERO, MD;  Location: [facility];  Service: Pulmonary;;   BRONCHIAL NEEDLE ASPIRATION BIOPSY  04/09/2021   Procedure: BRONCHIAL NEEDLE ASPIRATION BIOPSIES;  Surgeon: Gladis Leonor HERO, MD;  Location: [facility];  Service:  Pulmonary;;   BRONCHIAL WASHINGS  04/09/2021   Procedure: BRONCHIAL WASHINGS;  Surgeon: Gladis Leonor HERO, MD;  Location: [facility];  Service: Pulmonary;;   ENDOBRONCHIAL ULTRASOUND N/A 04/09/2021   Procedure: ENDOBRONCHIAL ULTRASOUND;  Surgeon: Gladis Leonor HERO, MD;  Location: [facility];  Service: Pulmonary;  Laterality: N/A;   HEMOSTASIS CONTROL  04/09/2021   Procedure: HEMOSTASIS CONTROL;  Surgeon: Gladis Leonor HERO, MD;  Location: [facility];  Service: Pulmonary;;   IR REMOVAL TUN ACCESS W/ PORT W/O FL MOD SED  07/30/2022   RIGHT/LEFT HEART CATH AND CORONARY ANGIOGRAPHY N/A 10/10/2020   Procedure: RIGHT/LEFT HEART CATH AND CORONARY ANGIOGRAPHY;  Surgeon: Cherrie Toribio SAUNDERS, MD;  Location: [facility];  Service: Cardiovascular;  Laterality: N/A;   VIDEO BRONCHOSCOPY N/A 04/09/2021   Procedure: VIDEO BRONCHOSCOPY WITH FLUORO;  Surgeon: Gladis Leonor HERO, MD;  Location: [facility];  Service: Pulmonary;  Laterality: N/A;    MEDICATIONS: No current facility-administered medications for this encounter.    carvedilol  (COREG ) 12.5 MG tablet   ENTRESTO  24-26 MG   famotidine  (PEPCID ) 40 MG tablet   FARXIGA  10 MG TABS tablet   HYDROcodone -acetaminophen  (NORCO/VICODIN) 5-325 MG tablet   lidocaine  (LIDODERM ) 5 %   spironolactone  (ALDACTONE ) 25 MG tablet   sulfamethoxazole -trimethoprim  (BACTRIM  DS) 800-160 MG tablet    Isaiah Ruder, PA-C Surgical Short Stay/Anesthesiology [facility] Phone [phone] [facility] Phone [phone] 01/11/2024 11:33 AM

## 2024-01-11 NOTE — Progress Notes (Signed)
 SDW CALL  Patient was given pre-op instructions over the phone. The opportunity was given for the patient to ask questions. No further questions asked. Patient verbalized understanding of instructions given.  Unable to reach patient at the number listed.  Spoke with daughter and she said the patient could be reached at [phone]   PCP - Jon Moores Cardiologist - Dr. Jerel Croitoru  PPM/ICD - denies   Chest x-ray - denies EKG - 03/06/23 Stress Test - denies ECHO - 07/31/22 Cardiac Cath - 10/10/20  Sleep Study - denies  Patient denies a history of DM and does not check blood sugar at home  Last dose of GLP1 agonist-  na GLP1 instructions: n/a  Last dose of Farxiga  was 8/25 because patient states that her prescription ran out  Blood Thinner Instructions: n/a Aspirin  Instructions: n/a  ERAS Protcol - clears until 1115 - explained in detail to the patient and patient repeated back instructions   COVID TEST- n/a   Anesthesia review: yes  Patient denies shortness of breath, fever, cough and chest pain over the phone call   All instructions explained to the patient, with a verbal understanding of the material. Patient agrees to go over the instructions while at home for a better understanding.

## 2024-01-12 ENCOUNTER — Inpatient Hospital Stay: Admitting: Oncology

## 2024-01-12 ENCOUNTER — Telehealth: Payer: Self-pay | Admitting: Oncology

## 2024-01-12 ENCOUNTER — Inpatient Hospital Stay

## 2024-01-12 ENCOUNTER — Encounter (HOSPITAL_COMMUNITY): Payer: Self-pay | Admitting: Vascular Surgery

## 2024-01-12 ENCOUNTER — Telehealth: Payer: Self-pay

## 2024-01-12 ENCOUNTER — Ambulatory Visit (HOSPITAL_COMMUNITY): Admission: RE | Admit: 2024-01-12 | Source: Home / Self Care | Admitting: Otolaryngology

## 2024-01-12 HISTORY — DX: Pneumonia, unspecified organism: J18.9

## 2024-01-12 SURGERY — EXCISION, MASS, NECK
Anesthesia: General | Laterality: Right

## 2024-01-12 MED ORDER — HYDROCODONE-ACETAMINOPHEN 5-325 MG PO TABS: 1.0000 | ORAL_TABLET | Freq: Three times a day (TID) | ORAL | 0 refills | Status: DC | PRN

## 2024-01-12 NOTE — Telephone Encounter (Signed)
 Followed up with patient's daughter, Suzy, regarding missed Lab appt and appt with Dr. Autumn at [facility] for 0930. Daughter reported patient having minor day surgery today to remove a fatty tumor. Suzy agreed to allow scheduler to reach out to her to reschedule for her mom.   Patient's phone is currently inactive but Natarsha asked to keep the phone number in place as it should be reactivated soon.

## 2024-01-12 NOTE — Telephone Encounter (Signed)
 Marie Church has been re-scheduled for her appointment for 9/3 at 2:45. She and her daughter were on the line and they are aware of all appointment details.

## 2024-01-12 NOTE — Telephone Encounter (Signed)
 Patient called for refill of Hydrocodone  5-325 MG. Patient phone is receive call backs. Her telephone is not working properly. We need to confirm Rx.  Call back phone number to use is [phone]  Filled  Written  ID  Drug  QTY  Days  Prescriber  RX #  Dispenser  Refill  Daily Dose*  Pymt Type  PMP  11/05/2023 11/05/2023 1  Hydrocodone -Acetamin 5-325 Mg 90.00 30 Yu Sht [id number] [facility] [phone]) 0/0 15.00 MME Medicare [place] 10/18/2023 10/18/2023 1  Tramadol  Hcl 50 Mg Tablet 20.00 3 Ma Pfe [id number] [facility] ([id number]) 0/0 66.67 MME Medicare [place]

## 2024-01-19 ENCOUNTER — Inpatient Hospital Stay

## 2024-01-19 ENCOUNTER — Inpatient Hospital Stay: Attending: Oncology | Admitting: Oncology

## 2024-01-19 VITALS — BP 133/83 | HR 79 | Temp 97.8°F | Resp 17 | Ht 66.0 in | Wt 202.0 lb

## 2024-01-19 DIAGNOSIS — Z923 Personal history of irradiation: Secondary | ICD-10-CM | POA: Insufficient documentation

## 2024-01-19 DIAGNOSIS — C50911 Malignant neoplasm of unspecified site of right female breast: Secondary | ICD-10-CM | POA: Diagnosis not present

## 2024-01-19 DIAGNOSIS — D72819 Decreased white blood cell count, unspecified: Secondary | ICD-10-CM | POA: Insufficient documentation

## 2024-01-19 DIAGNOSIS — R768 Other specified abnormal immunological findings in serum: Secondary | ICD-10-CM | POA: Diagnosis not present

## 2024-01-19 DIAGNOSIS — Z853 Personal history of malignant neoplasm of breast: Secondary | ICD-10-CM | POA: Insufficient documentation

## 2024-01-19 DIAGNOSIS — M069 Rheumatoid arthritis, unspecified: Secondary | ICD-10-CM | POA: Diagnosis not present

## 2024-01-19 DIAGNOSIS — Z9221 Personal history of antineoplastic chemotherapy: Secondary | ICD-10-CM | POA: Diagnosis not present

## 2024-01-19 DIAGNOSIS — R778 Other specified abnormalities of plasma proteins: Secondary | ICD-10-CM | POA: Insufficient documentation

## 2024-01-19 DIAGNOSIS — D649 Anemia, unspecified: Secondary | ICD-10-CM | POA: Diagnosis not present

## 2024-01-19 DIAGNOSIS — C773 Secondary and unspecified malignant neoplasm of axilla and upper limb lymph nodes: Secondary | ICD-10-CM

## 2024-01-19 LAB — CMP (CANCER CENTER ONLY)
ALT: 14 U/L (ref 0–44)
AST: 24 U/L (ref 15–41)
Albumin: 3.9 g/dL (ref 3.5–5.0)
Alkaline Phosphatase: 67 U/L (ref 38–126)
Anion gap: 4 — ABNORMAL LOW (ref 5–15)
BUN: 18 mg/dL (ref 8–23)
CO2: 30 mmol/L (ref 22–32)
Calcium: 9.4 mg/dL (ref 8.9–10.3)
Chloride: 100 mmol/L (ref 98–111)
Creatinine: 0.94 mg/dL (ref 0.44–1.00)
GFR, Estimated: >60 mL/min (ref >60)
Glucose, Bld: 95 mg/dL (ref 70–99)
Potassium: 4.7 mmol/L (ref 3.5–5.1)
Sodium: 134 mmol/L — ABNORMAL LOW (ref 135–145)
Total Bilirubin: 0.4 mg/dL (ref 0.0–1.2)
Total Protein: 9.9 g/dL — ABNORMAL HIGH (ref 6.5–8.1)

## 2024-01-19 LAB — RETICULOCYTES
Immature Retic Fract: 17.8 % — ABNORMAL HIGH (ref 2.3–15.9)
RBC.: 3.16 MIL/uL — ABNORMAL LOW (ref 3.87–5.11)
Retic Count, Absolute: 31.6 K/uL (ref 19.0–186.0)
Retic Ct Pct: 1 % (ref 0.4–3.1)

## 2024-01-19 LAB — CBC WITH DIFFERENTIAL (CANCER CENTER ONLY)
Abs Immature Granulocytes: 0.21 K/uL — ABNORMAL HIGH (ref 0.00–0.07)
Basophils Absolute: 0 K/uL (ref 0.0–0.1)
Basophils Relative: 0 %
Eosinophils Absolute: 0 K/uL (ref 0.0–0.5)
Eosinophils Relative: 1 %
HCT: 29.2 % — ABNORMAL LOW (ref 36.0–46.0)
Hemoglobin: 9.3 g/dL — ABNORMAL LOW (ref 12.0–15.0)
Immature Granulocytes: 6 %
Lymphocytes Relative: 49 %
Lymphs Abs: 1.7 K/uL (ref 0.7–4.0)
MCH: 29.3 pg (ref 26.0–34.0)
MCHC: 31.8 g/dL (ref 30.0–36.0)
MCV: 92.1 fL (ref 80.0–100.0)
Monocytes Absolute: 0.3 K/uL (ref 0.1–1.0)
Monocytes Relative: 7 %
Neutro Abs: 1.3 K/uL — ABNORMAL LOW (ref 1.7–7.7)
Neutrophils Relative %: 37 %
Platelet Count: 157 K/uL (ref 150–400)
RBC: 3.17 MIL/uL — ABNORMAL LOW (ref 3.87–5.11)
RDW: 17.2 % — ABNORMAL HIGH (ref 11.5–15.5)
WBC Count: 3.5 K/uL — ABNORMAL LOW (ref 4.0–10.5)
nRBC: 0 % (ref 0.0–0.2)

## 2024-01-19 LAB — HEPATITIS PANEL, ACUTE
HCV Ab: NONREACTIVE
Hep A IgM: NONREACTIVE
Hep B C IgM: NONREACTIVE
Hepatitis B Surface Ag: NONREACTIVE

## 2024-01-19 LAB — HIV ANTIBODY (ROUTINE TESTING W REFLEX): HIV Screen 4th Generation wRfx: NONREACTIVE

## 2024-01-19 NOTE — Assessment & Plan Note (Signed)
 Diagnosed in November 2021 and managed at [place], [place].  ER negative, PR negative, HER2 positive.  Originally plan was to treat her with [facility] regimen every 3 weeks for 6 cycles as part of neoadjuvant treatment.     C1 HP only due to poor PS. 05/16/20 added weekly Taxol. 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 Recent mammogram was normal. Discussed annual mammograms and monitoring for changes or new symptoms. HER2 positive status has a slightly higher recurrence risk  - Continue annual mammograms  - Monitor for changes in the breast or new symptoms

## 2024-01-19 NOTE — Assessment & Plan Note (Signed)
 Chronic anemia with low hemoglobin levels. Hemoglobin was 11.2 in December 2024. Chemotherapy for breast cancer may have contributed. No current iron supplementation.   On her initial consultation with us  on 06/18/2023, labs revealed persistent anemia with hemoglobin of 10.2, hematocrit 32.8, MCV 94.5.  White count 3400 with normal differential, ANC of 1500.  Platelet count of 160,000.  CMP unremarkable except for total protein of 9.2 g/dL.  Creatinine was normal at 0.9, calcium normal at 9.4, albumin 4.1. Iron studies, ferritin, B12, folate, serum copper , LDH were within normal limits.   SPEP showed no evidence of M spike.  IFE showed polyclonal increase detected in one or more immunoglobulins.  Serum free kappa was elevated at 118 mg/L, serum free lambda was elevated at 33.6 mg/L, ratio elevated at 3.54.  Quantitative IgG was increased at 3368 mg/dL, IgA was also slightly increased at 375 mg/dL and IgM was also increased at 245 mg/dL.  Overall picture was not consistent with monoclonal gammopathy but rather polyclonal increase in proteins from uncertain etiology.  Normal renal function and normal calcium.   Labs today showed stable hemoglobin of 9.3, MCV 92.1.  White count 3500 with normal differential and normal platelet count of 157,000.  Anemia seems to be from anemia of chronic disease at this time.  No indication for transfusion at current levels.  Plan to transfuse if hemoglobin is below 7.  Will continue surveillance.

## 2024-01-19 NOTE — Assessment & Plan Note (Signed)
 Chronic, slightly elevated total protein of uncertain significance.    On her initial consultation with us  on 06/18/2023, labs revealed total protein of 9.2 g/dL.  Creatinine was normal at 0.9, calcium normal at 9.4, albumin 4.1.  Hemoglobin was stable at 10.2.   SPEP showed no evidence of M spike.  IFE showed polyclonal increase detected in one or more immunoglobulins.  Serum free kappa was elevated at 118 mg/L, serum free lambda was elevated at 33.6 mg/L, ratio elevated at 3.54.  Quantitative IgG was increased at 3368 mg/dL, IgA was also slightly increased at 375 mg/dL and IgM was also increased at 245 mg/dL.  Overall picture is not consistent with monoclonal gammopathy but rather polyclonal increase in proteins from uncertain etiology.  Normal renal function and normal calcium.    Total protein is slightly elevated at 9.9 today.  No hematological intervention is warranted at current levels.  On 09/24/2023, ANA was positive with reflex testing positive for anti-Ro and anti-La antibodies.  Rheumatoid factor was elevated at 266 IU/mL.  ESR was elevated at 123 mm/h.  CRP borderline at 1.  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  IFE showed polyclonal increase.  Both kappa and lambda were increased with ratio of 3.3, consistent with chronic disease picture.  No evidence of monoclonal gammopathy.  Elevated total protein seems to be polyclonal from underlying inflammatory conditions.  Will continue observation at this time.

## 2024-01-19 NOTE — Progress Notes (Signed)
 "  [facility]  HEMATOLOGY CLINIC PROGRESS NOTE  PATIENT NAME: Marie Church   MR#: [id number] DOB: 1956-03-01  Patient Care Team: Theotis Haze ORN, NP as PCP - General (Nurse Practitioner) Francyne Headland, MD as PCP - Cardiology (Cardiology)  Date of visit: 01/19/2024   ASSESSMENT & PLAN:   Marie Church is a 68 y.o. lady with a past medical history of stage II B right-sided breast cancer, ER negative, PR negative, HER2/neu positive, diagnosed in November 2021 and treated at [place], [place], hypertension, diabetes mellitus type 2, chronic thrombocytopenia, CHF from chemotherapy, which resolved, was referred to our service in January 2025 for evaluation of anemia and elevated total protein.     Normocytic anemia Chronic anemia with low hemoglobin levels. Hemoglobin was 11.2 in December 2024. Chemotherapy for breast cancer may have contributed. No current iron supplementation.   On her initial consultation with us  on 06/18/2023, labs revealed persistent anemia with hemoglobin of 10.2, hematocrit 32.8, MCV 94.5.  White count 3400 with normal differential, ANC of 1500.  Platelet count of 160,000.  CMP unremarkable except for total protein of 9.2 g/dL.  Creatinine was normal at 0.9, calcium normal at 9.4, albumin 4.1. Iron studies, ferritin, B12, folate, serum copper , LDH were within normal limits.   SPEP showed no evidence of M spike.  IFE showed polyclonal increase detected in one or more immunoglobulins.  Serum free kappa was elevated at 118 mg/L, serum free lambda was elevated at 33.6 mg/L, ratio elevated at 3.54.  Quantitative IgG was increased at 3368 mg/dL, IgA was also slightly increased at 375 mg/dL and IgM was also increased at 245 mg/dL.  Overall picture was not consistent with monoclonal gammopathy but rather polyclonal increase in proteins from uncertain etiology.  Normal renal function and normal calcium.   Labs today showed stable hemoglobin of 9.3, MCV 92.1.   White count 3500 with normal differential and normal platelet count of 157,000.  Anemia seems to be from anemia of chronic disease at this time.  No indication for transfusion at current levels.  Plan to transfuse if hemoglobin is below 7.  Will continue surveillance.   Elevated total protein Chronic, slightly elevated total protein of uncertain significance.    On her initial consultation with us  on 06/18/2023, labs revealed total protein of 9.2 g/dL.  Creatinine was normal at 0.9, calcium normal at 9.4, albumin 4.1.  Hemoglobin was stable at 10.2.   SPEP showed no evidence of M spike.  IFE showed polyclonal increase detected in one or more immunoglobulins.  Serum free kappa was elevated at 118 mg/L, serum free lambda was elevated at 33.6 mg/L, ratio elevated at 3.54.  Quantitative IgG was increased at 3368 mg/dL, IgA was also slightly increased at 375 mg/dL and IgM was also increased at 245 mg/dL.  Overall picture is not consistent with monoclonal gammopathy but rather polyclonal increase in proteins from uncertain etiology.  Normal renal function and normal calcium.    Total protein is slightly elevated at 9.9 today.  No hematological intervention is warranted at current levels.  On 09/24/2023, ANA was positive with reflex testing positive for anti-Ro and anti-La antibodies.  Rheumatoid factor was elevated at 266 IU/mL.  ESR was elevated at 123 mm/h.  CRP borderline at 1.  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  IFE showed polyclonal increase.  Both kappa and lambda were increased with ratio of 3.3, consistent with chronic disease picture.  No evidence of monoclonal gammopathy.  Elevated total protein seems to be polyclonal from underlying inflammatory conditions.  Will continue observation at this time.  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  ER negative, PR  negative, HER2 positive.  Originally plan was to treat her with [facility] regimen every 3 weeks for 6 cycles as part of neoadjuvant treatment.     C1 HP only due to poor PS. 05/16/20 added weekly Taxol. (THP)  She had Herceptin 5/13 and then was admitted with CHF [facility]) prior to her next treatment   She ended up having lumpectomy 11/2020 - 6 foci of IDC, largest 4mm but did have LVI and 2/18 nodes  12/2020 re-excision of positive margin   Completed 5 cycles Kadcyla 06/20/2021 - stopped due to low PLT   Currently in remission with no evidence of disease. Recent mammogram was normal. Discussed annual mammograms and monitoring for changes or new symptoms. HER2 positive status has a slightly higher recurrence risk  - Continue annual mammograms  - Monitor for changes in the breast or new symptoms  Leukopenia Fluctuating leukopenia.  White count stable at 3500 with normal differential. No signs of infection such as fever, chills, or respiratory symptoms.  Positive ANA.  Elevated rheumatoid factor.  This could be contributing to leukopenia.  Past chemotherapy may also be a contributing factor.  - Consider bone marrow biopsy if white blood cell count continues to decrease  ANA positive As part of leukopenia workup, on 09/24/2023 we checked ANA and it was positive with reflex testing positive for anti-Ro and anti-La antibodies.  Rheumatoid factor was also elevated at 226 IU/mL.  Concern for underlying connective tissue disorder versus RA.  Referral sent to rheumatology for further evaluation.  Assessment and Plan Assessment & Plan   Rheumatoid arthritis (knee) Rheumatoid arthritis is present, primarily affecting the knee. She receives injections in the knee for management. No other joints are reported to be affected. She is not currently seeing a rheumatologist within the system for this condition. - Continue current management with knee injections. - Ensure referral to rheumatology for  comprehensive evaluation and management.    I spent a total of 30 minutes during this encounter with the patient including review of chart and various tests results, discussions about plan of care and coordination of care plan.  I reviewed lab results and outside records for this visit and discussed relevant results with the patient. Diagnosis, plan of care and treatment options were also discussed in detail with the patient. Opportunity provided to ask questions and answers provided to her apparent satisfaction. Provided instructions to call our clinic with any problems, questions or concerns prior to return visit. I recommended to continue follow-up with PCP and sub-specialists. She verbalized understanding and agreed with the plan. No barriers to learning was detected.  Vashon Arch, MD   [facility] - A DEPT OF Marie DEL.  Church [address] [place] [address] Dept: [phone] Dept Fax: [phone]   CHIEF COMPLAINT/ REASON FOR VISIT:  Follow-up for history of normocytic anemia, elevated total protein, stage II B right-sided breast cancer diagnosed in 2021 and managed at outside facility in [place], [place].   INTERVAL HISTORY:  Discussed the use of AI scribe software for clinical note transcription with the patient, who gave verbal consent to proceed.  History of Present Illness  Marie Church is a  68 year old female with anemia and rheumatoid arthritis who presents for follow-up of her blood counts and potential Sjogren's syndrome. She was referred for evaluation of anemia and potential multiple myeloma.  She has a history of anemia with fluctuating blood counts, which have recently shown improvement. Previous workup, including bone marrow studies, was performed to evaluate for multiple myeloma. Recent blood counts include a white blood cell count of 3,500, hemoglobin of 9.3, and normal platelet count.  She has  rheumatoid arthritis primarily affecting her knee, for which she receives injections. No arthritis in other areas and is managed by a regular doctor rather than a rheumatologist.  A recent ANA test was positive. No symptoms of dry mouth or dry eyes, which are commonly associated with the condition.  She was scheduled for lipoma removal surgery but postponed it due to concerns about her blood work. Her platelet count is stable.  She takes multiple supplements for heart health, cholesterol, and inflammation, though specific names and dosages are not detailed.   SUMMARY OF HEMATOLOGIC HISTORY:  She has had chronic anemia and previously thrombocytopenia, presumed to be from chemotherapy.  She was also found to have elevated total protein which was 9.8 in October 2024 and 9.4 in December 2024.  Hence a referral was sent was for further evaluation of anemia and elevated total protein.   The patient is not currently on any medication for her breast cancer and is under observation. The patient underwent a lumpectomy for her right breast and had 18 lymph nodes removed, one of which was cancerous. The patient completed chemotherapy and radiation therapy nearly two years ago. The patient also reports having arthritis and is due for a knee replacement.   She denies fever, cough, diarrhea, or other infectious symptoms.  She denies epistaxis, bloody stool, melena, hematuria, bruising or other bleeding symptoms. She also denies unintentional weight loss, night sweats or other constitutional symptoms.   Chronic anemia with low hemoglobin levels. Hemoglobin was 11.2 in December 2024. Chemotherapy for breast cancer may have contributed. No current iron supplementation.    On her initial consultation with us  on 06/18/2023, labs revealed persistent anemia with hemoglobin of 10.2, hematocrit 32.8, MCV 94.5.  White count 3400 with normal differential, ANC of 1500.  Platelet count of 160,000.  CMP unremarkable except for  total protein of 9.2 g/dL.  Creatinine was normal at 0.9, calcium normal at 9.4, albumin 4.1. Iron studies, ferritin, B12, folate, serum copper , LDH were within normal limits.   SPEP showed no evidence of M spike.  IFE showed polyclonal increase detected in one or more immunoglobulins.  Serum free kappa was elevated at 118 mg/L, serum free lambda was elevated at 33.6 mg/L, ratio elevated at 3.54.  Quantitative IgG was increased at 3368 mg/dL, IgA was also slightly increased at 375 mg/dL and IgM was also increased at 245 mg/dL.  Overall picture is not consistent with monoclonal gammopathy but rather polyclonal increase in proteins from uncertain etiology.  Normal renal function and normal calcium.    No hematological intervention is warranted at current levels.   On 09/24/2023, ANA was positive with reflex testing positive for anti-Ro and anti-La antibodies.  Rheumatoid factor was elevated at 266 IU/mL.  ESR was elevated at 123 mm/h.  CRP borderline at 1.  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  IFE showed polyclonal increase.  Both kappa and lambda were increased with ratio of 3.3, consistent with chronic disease picture.  No evidence of monoclonal gammopathy.  Referral sent to rheumatology for further evaluation of ANA positivity, positivity for anti-Ro and anti-La antibodies and elevated RF.  I have reviewed the past medical history, past surgical history, social history and family history with the patient and they are unchanged from previous note.  ALLERGIES: She is allergic to doxycycline, amoxicillin-pot clavulanate, and lisinopril.  MEDICATIONS:  Current Outpatient Medications  Medication Sig Dispense Refill   carvedilol  (COREG ) 12.5 MG tablet TAKE 1 TABLET(12.5 MG) BY MOUTH TWICE DAILY WITH A MEAL 60 tablet 0   ENTRESTO  24-26 MG TAKE 1 TABLET BY MOUTH TWICE DAILY 60 tablet 0   famotidine  (PEPCID ) 40 MG tablet Take 1 tablet (40 mg total)  by mouth daily. 90 tablet 1   FARXIGA  10 MG TABS tablet Take 1 tablet (10 mg total) by mouth daily. 90 tablet 0   HYDROcodone -acetaminophen  (NORCO/VICODIN) 5-325 MG tablet Take 1 tablet by mouth every 8 (eight) hours as needed for moderate pain (pain score 4-6). 90 tablet 0   spironolactone  (ALDACTONE ) 25 MG tablet Take 1 tablet (25 mg total) by mouth daily. 90 tablet 1   sulfamethoxazole -trimethoprim  (BACTRIM  DS) 800-160 MG tablet Take 1 tablet by mouth 2 (two) times daily. 14 tablet 0   No current facility-administered medications for this visit.     REVIEW OF SYSTEMS:    Review of Systems - Oncology  All other pertinent systems were reviewed with the patient and are negative.  PHYSICAL EXAMINATION:    Onc Performance Status - 01/19/24 1542       ECOG Perf Status   ECOG Perf Status Restricted in physically strenuous activity but ambulatory and able to carry out work of a light or sedentary nature, e.g., light house work, office work      KPS SCALE   KPS % SCORE Normal activity with effort, some s/s of disease           Vitals:   01/19/24 1529 01/19/24 1534  BP: (!) 145/86 133/83  Pulse: 79   Resp: 17   Temp: 97.8 F (36.6 C)   SpO2: 100%     Filed Weights   01/19/24 1529  Weight: 202 lb (91.6 kg)     Physical Exam Constitutional:      General: She is not in acute distress.    Appearance: Normal appearance.  HENT:     Head: Normocephalic and atraumatic.  Eyes:     General: No scleral icterus.    Conjunctiva/sclera: Conjunctivae normal.  Cardiovascular:     Rate and Rhythm: Normal rate and regular rhythm.     Heart sounds: Normal heart sounds.  Pulmonary:     Effort: Pulmonary effort is normal.     Breath sounds: Normal breath sounds.  Abdominal:     General: There is no distension.  Musculoskeletal:     Right lower leg: No edema.     Left lower leg: No edema.  Lymphadenopathy:     Cervical: No cervical adenopathy.  Neurological:     General: No  focal deficit present.     Mental Status: She is alert and oriented to person, place, and time.  Psychiatric:        Mood and Affect: Mood normal.        Behavior: Behavior normal.        Thought Content: Thought content normal.     LABORATORY DATA:   I have reviewed the data as listed.  Results for orders placed or performed in  visit on 01/19/24  HIV antibody (with reflex)  Result Value Ref Range   HIV Screen 4th Generation wRfx Non Reactive Non Reactive  Hepatitis panel, acute  Result Value Ref Range   Hepatitis B Surface Ag NON REACTIVE NON REACTIVE   HCV Ab NON REACTIVE NON REACTIVE   Hep A IgM NON REACTIVE NON REACTIVE   Hep B C IgM NON REACTIVE NON REACTIVE  Reticulocytes  Result Value Ref Range   Retic Ct Pct 1.0 0.4 - 3.1 %   RBC. 3.16 (L) 3.87 - 5.11 MIL/uL   Retic Count, Absolute 31.6 19.0 - 186.0 K/uL   Immature Retic Fract 17.8 (H) 2.3 - 15.9 %  CMP (Cancer Center only)  Result Value Ref Range   Sodium 134 (L) 135 - 145 mmol/L   Potassium 4.7 3.5 - 5.1 mmol/L   Chloride 100 98 - 111 mmol/L   CO2 30 22 - 32 mmol/L   Glucose, Bld 95 70 - 99 mg/dL   BUN 18 8 - 23 mg/dL   Creatinine 9.05 9.55 - 1.00 mg/dL   Calcium 9.4 8.9 - 89.6 mg/dL   Total Protein 9.9 (H) 6.5 - 8.1 g/dL   Albumin 3.9 3.5 - 5.0 g/dL   AST 24 15 - 41 U/L   ALT 14 0 - 44 U/L   Alkaline Phosphatase 67 38 - 126 U/L   Total Bilirubin 0.4 0.0 - 1.2 mg/dL   GFR, Estimated >39 >39 mL/min   Anion gap 4 (L) 5 - 15  CBC with Differential (Cancer Center Only)  Result Value Ref Range   WBC Count 3.5 (L) 4.0 - 10.5 K/uL   RBC 3.17 (L) 3.87 - 5.11 MIL/uL   Hemoglobin 9.3 (L) 12.0 - 15.0 g/dL   HCT 70.7 (L) 63.9 - 53.9 %   MCV 92.1 80.0 - 100.0 fL   MCH 29.3 26.0 - 34.0 pg   MCHC 31.8 30.0 - 36.0 g/dL   RDW 82.7 (H) 88.4 - 84.4 %   Platelet Count 157 150 - 400 K/uL   nRBC 0.0 0.0 - 0.2 %   Neutrophils Relative % 37 %   Neutro Abs 1.3 (L) 1.7 - 7.7 K/uL   Lymphocytes Relative 49 %   Lymphs Abs  1.7 0.7 - 4.0 K/uL   Monocytes Relative 7 %   Monocytes Absolute 0.3 0.1 - 1.0 K/uL   Eosinophils Relative 1 %   Eosinophils Absolute 0.0 0.0 - 0.5 K/uL   Basophils Relative 0 %   Basophils Absolute 0.0 0.0 - 0.1 K/uL   WBC Morphology See Note    Smear Review See Note    Immature Granulocytes 6 %   Abs Immature Granulocytes 0.21 (H) 0.00 - 0.07 K/uL   Tear Drop Cells PRESENT       RADIOGRAPHIC STUDIES:  No recent pertinent imaging studies available to review.  Orders Placed This Encounter  Procedures   CBC with Differential (Cancer Center Only)    Standing Status:   Future    Expected Date:   05/20/2024    Expiration Date:   08/18/2024   Iron and Iron Binding Capacity ([facility] only)    Standing Status:   Future    Expected Date:   05/20/2024    Expiration Date:   08/18/2024   Ferritin    Standing Status:   Future    Expected Date:   05/20/2024    Expiration Date:   08/18/2024   Lactate dehydrogenase    Standing Status:  Future    Expected Date:   05/20/2024    Expiration Date:   08/18/2024   Haptoglobin    Standing Status:   Future    Expected Date:   05/20/2024    Expiration Date:   08/18/2024   Methylmalonic acid, serum    Standing Status:   Future    Expected Date:   05/20/2024    Expiration Date:   08/18/2024   Ambulatory referral to Rheumatology    Referral Priority:   Routine    Referral Type:   Consultation    Referral Reason:   Specialty Services Required    Requested Specialty:   Rheumatology    Number of Visits Requested:   1     Future Appointments  Date Time Provider Department Center  02/14/2024  3:00 PM Urbano Albright, MD [facility] CPR  05/24/2024  2:45 PM [facility] None  05/24/2024  3:15 PM Maor Meckel, Chinita, MD [facility] None     This document was completed utilizing speech recognition software. Grammatical errors, random word insertions, pronoun errors, and incomplete sentences are an occasional consequence of this system due to software  limitations, ambient noise, and hardware issues. Any formal questions or concerns about the content, text or information contained within the body of this dictation should be directly addressed to the provider for clarification.  "

## 2024-01-19 NOTE — Assessment & Plan Note (Signed)
 Leukopenia with a decrease in white blood cell count from 3400 to 2900 cells/L. No signs of infection such as fever, chills, or respiratory symptoms. Possible causes include recent urinary tract infection or stressors affecting bone marrow function. Past chemotherapy may also be a contributing factor. - Repeat blood work in 6 weeks to monitor white blood cell count - Consider bone marrow biopsy if white blood cell count continues to decrease

## 2024-01-20 ENCOUNTER — Encounter: Payer: Self-pay | Admitting: Oncology

## 2024-01-20 DIAGNOSIS — R768 Other specified abnormal immunological findings in serum: Secondary | ICD-10-CM | POA: Insufficient documentation

## 2024-01-20 NOTE — Assessment & Plan Note (Signed)
 As part of leukopenia workup, on 09/24/2023 we checked ANA and it was positive with reflex testing positive for anti-Ro and anti-La antibodies.  Rheumatoid factor was also elevated at 226 IU/mL.  Concern for underlying connective tissue disorder versus RA.  Referral sent to rheumatology for further evaluation.

## 2024-01-21 LAB — EPSTEIN-BARR VIRUS VCA, IGG: EBV VCA IgG: >600 U/mL — ABNORMAL HIGH (ref 0.0–17.9)

## 2024-01-22 LAB — METHYLMALONIC ACID, SERUM: Methylmalonic Acid, Quantitative: 169 nmol/L (ref 0–378)

## 2024-02-03 ENCOUNTER — Other Ambulatory Visit: Payer: Self-pay | Admitting: Nurse Practitioner

## 2024-02-03 DIAGNOSIS — I10 Essential (primary) hypertension: Secondary | ICD-10-CM

## 2024-02-03 DIAGNOSIS — K219 Gastro-esophageal reflux disease without esophagitis: Secondary | ICD-10-CM

## 2024-02-03 DIAGNOSIS — I509 Heart failure, unspecified: Secondary | ICD-10-CM

## 2024-02-03 NOTE — Telephone Encounter (Signed)
 Copied from CRM [id number]. Topic: Clinical - Medication Refill >> Feb 03, 2024  1:51 PM Zebedee SAUNDERS wrote: Medication: carvedilol  (COREG ) 12.5 MG tablet, famotidine  (PEPCID ) 40 MG tablet, spironolactone  (ALDACTONE ) 25 MG tablet, ENTRESTO  24-26 MG, FARXIGA  10 MG TABS tablet,   Has the patient contacted their pharmacy? Yes (Agent: If no, request that the patient contact the pharmacy for the refill. If patient does not wish to contact the pharmacy document the reason why and proceed with request.) (Agent: If yes, when and what did the pharmacy advise?)  This is the patient's preferred pharmacy:  [facility] [address], [place] - [address] [place] [address] Phone: [phone] Fax: [phone]  Is this the correct pharmacy for this prescription? Yes If no, delete pharmacy and type the correct one.   Has the prescription been filled recently? Yes  Is the patient out of the medication? Yes  Has the patient been seen for an appointment in the last year OR does the patient have an upcoming appointment? Yes  Can we respond through MyChart? Yes  Agent: Please be advised that Rx refills may take up to 3 business days. We ask that you follow-up with your pharmacy.

## 2024-02-10 ENCOUNTER — Telehealth: Payer: Self-pay | Admitting: Nurse Practitioner

## 2024-02-10 NOTE — Telephone Encounter (Signed)
 Copied from CRM [id number]. Topic: Clinical - Medication Question >> Feb 10, 2024  3:05 PM Marie Church wrote: Reason for CRM: Reason for CRM: Calling for med refil status  carvedilol  (COREG ) 12.5 MG tablet, famotidine  (PEPCID ) 40 MG tablet, spironolactone  (ALDACTONE ) 25 MG tablet, ENTRESTO  24-26 MG, FARXIGA  10 MG TABS tablet Caller would like to know what meds are safe to take. Would like a c/b [phone]

## 2024-02-11 NOTE — Telephone Encounter (Signed)
 LVM informing patient that she needs an appointment before her medications can be refilled. Provided the office number in the message.

## 2024-02-14 ENCOUNTER — Encounter: Attending: Physical Medicine & Rehabilitation | Admitting: Physical Medicine & Rehabilitation

## 2024-02-14 ENCOUNTER — Encounter: Payer: Self-pay | Admitting: Physical Medicine & Rehabilitation

## 2024-02-14 VITALS — BP 170/83 | HR 86 | Ht 66.0 in | Wt 201.0 lb

## 2024-02-14 DIAGNOSIS — G894 Chronic pain syndrome: Secondary | ICD-10-CM | POA: Insufficient documentation

## 2024-02-14 DIAGNOSIS — M170 Bilateral primary osteoarthritis of knee: Secondary | ICD-10-CM | POA: Insufficient documentation

## 2024-02-14 DIAGNOSIS — Z79891 Long term (current) use of opiate analgesic: Secondary | ICD-10-CM | POA: Diagnosis not present

## 2024-02-14 DIAGNOSIS — Z5181 Encounter for therapeutic drug level monitoring: Secondary | ICD-10-CM | POA: Diagnosis not present

## 2024-02-14 MED ORDER — HYDROCODONE-ACETAMINOPHEN 5-325 MG PO TABS: 1.0000 | ORAL_TABLET | Freq: Three times a day (TID) | ORAL | 0 refills | Status: DC | PRN

## 2024-02-14 NOTE — Progress Notes (Signed)
 Subjective:    Patient ID: Marie Church, female    DOB: 1955/09/30, 68 y.o.   MRN: [id number]  HPI HPI  Marie Church is a 68 y.o. year old female  who  has a past medical history of Anemia, Anxiety, Arthritis, Bacteremia (07/31/2022), Breast cancer (HCC), Cataract, CHF (congestive heart failure) (HCC), DM type 2 (diabetes mellitus, type 2) (HCC), Hypertension, Pneumonia, Port or reservoir infection (07/30/2022), Streptococcal bacteremia (07/30/2022), and Thrombocytopenia (03/30/2021).   They are presenting to PM&R clinic as a new patient for pain management evaluation. They were referred by Dr. Addie for treatment of b/l Knee OA  pain.  Patient has had pain in her knees bilaterally for over 10 years.  Pain is worse in her right knee than her left knee.  Denies any history of trauma to her knees or surgery to her knees.  Standing and walking worsens her pain.  She had a right knee injection, was on by Dr. Addie 12/28/2022 for severe knee OA.  Dr. Addie discussed TKA however patient not interested at this time.  She previously had chemotherapy and x-ray treatment for breast cancer.   Red flag symptoms: No red flags for back pain endorsed in Hx or ROS  Medications tried:  Topical medications Voltaren  doesn't help her- made it hurt worse Nsaids Ibuprofen  minimal benefit  Tylenol   Helps slightly  Opiates  Tramadol  helped her move around a little better Gabapentin  / Lyrica  - Denies  TCAs  - Denies  SNRIs  Duloxetine - denies  Other  - denies   Other treatments: PT- for her knees was helpful a few years ago  TENs unit - denies  Injections Cortisone knee injection  Surgery denies    Interval History 04/05/23 Patient is here for follow-up regarding her severe chronic knee pain.  Pain continues to be poorly controlled and makes it very difficult for her to ambulate.  Pain continues to be worse over the right knee compared to her left knee.  Tramadol  is no longer helping her pain and  keep it under control.  She reports last cortisone injection she had in her right knee helped for a few weeks.  Interval History 04/22/2023 Patient is here for bilateral knee injection with Zilretta .  She reports she has not had any knee injection in over 3 months.  She reports that Norco 5 has been helping keep her pain at a more tolerable level.  Denies any side effects with this medication.  Denies any new significant changes in her medical history.   Interval History 06/14/23 Pt reports zilretta  injections last visit was very beneficial. Injections are starting to wear off.  She continues to use Norco 5 with benefit, uses it only when pain is severe.  Last Rx lasted over 2 months.  No side effects with the medication.   Interval History 08/03/23 Patient reports Xarelto lasted for about 2 months after prior injection and then started to wear off.  She is here for repeat injection today.  She continues to use Norco 5 with reduction in her pain, she tries to use this sparingly only when pain is very severe.  She forgot to take her BP medications last night and this morning.  Interval History 11/05/23 Patient is here for her chronic pain follow-up.  Patient was wanting repeat bilateral injection with Zilretta  for her knees, reports this helped well last time.  Medications do not wear off.  She continues to use Norco 5 mg when pain is  particularly severe.  No side effects with the medication.  Interval History 11/05/23 Pt continues to have pain in both knees. She is here for zilretta  injections to both knees today.  She has just picked up last refill of norco. Norco has been helping her pain, no side effects.    Interval History 02/14/24 Follow-up for bilateral knee pain. Reports prior long-acting cortisone injections 3 months ago provided good relief for approximately 2 months. Pain is described as significant. Has a known diagnosis of arthritis in the knees.  Denies constipation.  - Hydrocodone   (Norco): Helps her pain without significant side effects. Takes the edge of the pain. She tries to use this only when pain is severe.   Pain Inventory Average Pain 9 Pain Right Now 7 My pain is intermittent and dull  In the last 24 hours, has pain interfered with the following? General activity 7 Relation with others 9 Enjoyment of life 7 What TIME of day is your pain at its worst? morning , daytime, evening, and night Sleep (in general) Good  Pain is worse with: walking, standing, and some activites Pain improves with: medication and injections Relief from Meds: 9       Family History  Problem Relation Age of Onset   Heart failure Neg Hx    Heart disease Neg Hx    Heart attack Neg Hx    Colon polyps Neg Hx    Colon cancer Neg Hx    Esophageal cancer Neg Hx    Stomach cancer Neg Hx    Rectal cancer Neg Hx    Social History   Socioeconomic History   Marital status: Widowed    Spouse name: Not on file   Number of children: Not on file   Years of education: Not on file   Highest education level: Not on file  Occupational History   Not on file  Tobacco Use   Smoking status: Former    Current packs/day: 0.00    Types: Cigarettes    Start date: 38    Quit date: 2000    Years since quitting: 25.7   Smokeless tobacco: Never  Vaping Use   Vaping status: Never Used  Substance and Sexual Activity   Alcohol use: Not Currently   Drug use: Never   Sexual activity: Not on file  Other Topics Concern   Not on file  Social History Narrative   Not on file   Social Drivers of Health   Financial Resource Strain: Low Risk  (02/27/2022)   Received from [facility]   Overall Financial Resource Strain (CARDIA)    Difficulty of Paying Living Expenses: Not hard at all  Food Insecurity: No Food Insecurity (07/30/2022)   Hunger Vital Sign    Worried About Running Out of Food in the Last Year: Never true    Ran Out of Food in the Last Year:  Never true  Transportation Needs: No Transportation Needs (07/30/2022)   PRAPARE - Administrator, Civil Service (Medical): No    Lack of Transportation (Non-Medical): No  Physical Activity: Not on file  Stress: Stress Concern Present (02/04/2021)   [employer] of Occupational Health - Occupational Stress Questionnaire    Feeling of Stress : To some extent  Social Connections: Unknown (07/14/2022)   Received from [facility]   Social Network    Social Network: Not on file   Past Surgical History:  Procedure Laterality Date   BREAST LUMPECTOMY  Right 11/30/2020   BRONCHIAL BIOPSY  04/09/2021   Procedure: BRONCHIAL BIOPSIES;  Surgeon: Gladis Leonor HERO, MD;  Location: [facility];  Service: Pulmonary;;   BRONCHIAL NEEDLE ASPIRATION BIOPSY  04/09/2021   Procedure: BRONCHIAL NEEDLE ASPIRATION BIOPSIES;  Surgeon: Gladis Leonor HERO, MD;  Location: [facility];  Service: Pulmonary;;   BRONCHIAL WASHINGS  04/09/2021   Procedure: BRONCHIAL WASHINGS;  Surgeon: Gladis Leonor HERO, MD;  Location: [facility];  Service: Pulmonary;;   ENDOBRONCHIAL ULTRASOUND N/A 04/09/2021   Procedure: ENDOBRONCHIAL ULTRASOUND;  Surgeon: Gladis Leonor HERO, MD;  Location: [facility];  Service: Pulmonary;  Laterality: N/A;   HEMOSTASIS CONTROL  04/09/2021   Procedure: HEMOSTASIS CONTROL;  Surgeon: Gladis Leonor HERO, MD;  Location: [facility];  Service: Pulmonary;;   IR REMOVAL TUN ACCESS W/ PORT W/O FL MOD SED  07/30/2022   RIGHT/LEFT HEART CATH AND CORONARY ANGIOGRAPHY N/A 10/10/2020   Procedure: RIGHT/LEFT HEART CATH AND CORONARY ANGIOGRAPHY;  Surgeon: Cherrie Toribio SAUNDERS, MD;  Location: [facility];  Service: Cardiovascular;  Laterality: N/A;   VIDEO BRONCHOSCOPY N/A 04/09/2021   Procedure: VIDEO BRONCHOSCOPY WITH FLUORO;  Surgeon: Gladis Leonor HERO, MD;  Location: [facility];  Service: Pulmonary;  Laterality: N/A;   Past Medical History:  Diagnosis Date   Anemia    Anxiety     Arthritis    Bacteremia 07/31/2022   Breast cancer (HCC)    Cataract    CHF (congestive heart failure) (HCC)    Pt states issues is resolved   DM type 2 (diabetes mellitus, type 2) (HCC)    reportedly steroid induced   Hypertension    Pneumonia    Port or reservoir infection 07/30/2022   Streptococcal bacteremia 07/30/2022   Thrombocytopenia 03/30/2021   There were no vitals taken for this visit.  Opioid Risk Score:   Fall Risk Score:  `1  Depression screen [facility] 2/9     11/05/2023   10:38 AM 08/03/2023   12:39 PM 06/14/2023   10:09 AM 04/29/2023    9:32 AM 04/22/2023    2:31 PM 04/05/2023    3:25 PM 03/05/2023    9:34 AM  Depression screen PHQ 2/9  Decreased Interest 0 0 0 0 0 0 2  Down, Depressed, Hopeless 0 0 0 0 0 0 0  PHQ - 2 Score 0 0 0 0 0 0 2  Altered sleeping 0   0   0  Tired, decreased energy 0   3   3  Change in appetite 0   0   0  Feeling bad or failure about yourself  0   0   0  Trouble concentrating 0   0   0  Moving slowly or fidgety/restless 0   0   0  Suicidal thoughts 0   0   0  PHQ-9 Score 0   3   5  Difficult doing work/chores    Extremely dIfficult       Review of Systems  Constitutional: Negative.   HENT: Negative.    Eyes: Negative.   Respiratory: Negative.    Cardiovascular: Negative.   Gastrointestinal: Negative.   Endocrine: Negative.   Genitourinary: Negative.   Musculoskeletal:  Positive for gait problem.       Pain in both knees  Skin: Negative.   Allergic/Immunologic: Negative.   Hematological: Negative.   Psychiatric/Behavioral: Negative.    All other systems reviewed and are negative.      Objective:   Physical Exam  Gen: no distress, normal appearing HEENT: oral mucosa pink and moist, NCAT Chest: normal effort, normal rate of breathing Abd: soft, non-distended Ext: no edema Psych: pleasant, normal affect Skin: intact Neuro: alert and awake, follows commands RUE: moving all 4 to gravity and resistance Sensory  exam normal for light touch and pain in all 4 limbs.   Musculoskeletal:  Bilateral knee joint line tenderness present, no signs of infection around knees, no rashes in this area Antalgic gait        Assessment & Plan:   1) Chronic b/l Knee pain with b/l Knee OA Right greater than Left  2) Hx of breast cancer  3) HTN  -Discussed PT, She reports limited by transportation/schedule -Zynex, Knee brace ordered prior visit -Hold off on TENS due to prior cancer Hx -UDS and pain agreement today -Tramadol  50mg  TID PRN-was discontinued prior visit -Continue Norco 5-325 TID PRN -Continue UDS and pill counts.  Continue PDMP monitoring.  Pain contract completed prior visit. -Consider Genicular nerve block  -Advised monitoring BP and taking BP medications as directed.  BP elevated but improved after a few minutes on recheck, Advised f/u with PCP or ER if severely elevated.  -Pain is fairly constant consider Butrans patch  at later visit   Knee injection today with Zilretta  bilateral   Indication:Knee pain not relieved by medication management and other conservative care.   Informed consent was obtained after describing risks and benefits of the procedure with the patient, this includes bleeding, bruising, infection and medication side effects. The patient wishes to proceed and has given written consent. The patient was placed in a recumbent position. The Lateral aspect of the knee was marked and prepped with Betadine and alcohol. It was then entered with a 21-gauge 1-1/2 inch needle.  After negative draw back for blood, a solution containing 5 ML of 32 mg Zilretta  solution was injected. The patient tolerated the procedure well. Post procedure instructions were given.    Procedure was completed for both knees  Warnings -10/28/2023 PDMP review indicates she picked up few days of tramadol  from different provider.  Appears this is from ER as she reports she did not use it and will bring it in.   Verbal warning provided.

## 2024-02-15 DIAGNOSIS — Z5181 Encounter for therapeutic drug level monitoring: Secondary | ICD-10-CM | POA: Diagnosis not present

## 2024-02-15 DIAGNOSIS — G894 Chronic pain syndrome: Secondary | ICD-10-CM | POA: Diagnosis not present

## 2024-02-15 DIAGNOSIS — M170 Bilateral primary osteoarthritis of knee: Secondary | ICD-10-CM | POA: Diagnosis not present

## 2024-02-15 DIAGNOSIS — Z79891 Long term (current) use of opiate analgesic: Secondary | ICD-10-CM | POA: Diagnosis not present

## 2024-02-15 MED ORDER — TRIAMCINOLONE ACETONIDE 32 MG IX SRER
32.0000 mg | Freq: Once | INTRA_ARTICULAR | Status: AC
  Administered 2024-02-15: 32 mg via INTRA_ARTICULAR

## 2024-02-15 NOTE — Addendum Note (Signed)
 Addended by: Rayen Dafoe M on: 02/15/2024 12:38 PM   Modules accepted: Orders

## 2024-02-16 NOTE — Progress Notes (Unsigned)
 Cardiology Clinic Note   Patient Name: Marie Church Date of Encounter: 02/18/2024  Primary Care Provider:  Theotis Haze ORN, NP Primary Cardiologist:  Jerel Balding, MD  Patient Profile    Marie Church 68 year old female presents to the clinic today for follow-up evaluation of her heart failure with reduced ejection fraction and essential hypertension.  Past Medical History    Past Medical History:  Diagnosis Date   Anemia    Anxiety    Arthritis    Bacteremia 07/31/2022   Breast cancer (HCC)    Cataract    CHF (congestive heart failure) (HCC)    Pt states issues is resolved   DM type 2 (diabetes mellitus, type 2) (HCC)    reportedly steroid induced   Hypertension    Pneumonia    Port or reservoir infection 07/30/2022   Streptococcal bacteremia 07/30/2022   Thrombocytopenia 03/30/2021   Past Surgical History:  Procedure Laterality Date   BREAST LUMPECTOMY Right 11/30/2020   BRONCHIAL BIOPSY  04/09/2021   Procedure: BRONCHIAL BIOPSIES;  Surgeon: Gladis Leonor HERO, MD;  Location: [facility];  Service: Pulmonary;;   BRONCHIAL NEEDLE ASPIRATION BIOPSY  04/09/2021   Procedure: BRONCHIAL NEEDLE ASPIRATION BIOPSIES;  Surgeon: Gladis Leonor HERO, MD;  Location: [facility];  Service: Pulmonary;;   BRONCHIAL WASHINGS  04/09/2021   Procedure: BRONCHIAL WASHINGS;  Surgeon: Gladis Leonor HERO, MD;  Location: [facility];  Service: Pulmonary;;   ENDOBRONCHIAL ULTRASOUND N/A 04/09/2021   Procedure: ENDOBRONCHIAL ULTRASOUND;  Surgeon: Gladis Leonor HERO, MD;  Location: [facility];  Service: Pulmonary;  Laterality: N/A;   HEMOSTASIS CONTROL  04/09/2021   Procedure: HEMOSTASIS CONTROL;  Surgeon: Gladis Leonor HERO, MD;  Location: [facility];  Service: Pulmonary;;   IR REMOVAL TUN ACCESS W/ PORT W/O FL MOD SED  07/30/2022   RIGHT/LEFT HEART CATH AND CORONARY ANGIOGRAPHY N/A 10/10/2020   Procedure: RIGHT/LEFT HEART CATH AND CORONARY ANGIOGRAPHY;  Surgeon:  Cherrie Toribio SAUNDERS, MD;  Location: [facility];  Service: Cardiovascular;  Laterality: N/A;   VIDEO BRONCHOSCOPY N/A 04/09/2021   Procedure: VIDEO BRONCHOSCOPY WITH FLUORO;  Surgeon: Gladis Leonor HERO, MD;  Location: [facility];  Service: Pulmonary;  Laterality: N/A;    Allergies  Allergies  Allergen Reactions   Doxycycline Rash   Amoxicillin-Pot Clavulanate Rash    Tolerates Rocephin    Lisinopril Rash    History of Present Illness    Marie Church has a PMH of HTN, type 2 diabetes, and metastatic breast cancer.  She underwent nuclear stress testing 2010 which showed no evidence of ischemia.  She was diagnosed with breast cancer in 2021.  She was found to have metastasis to lymph nodes.  She received chemotherapy and radiation.  Her echocardiogram 3/22 showed an LVEF of 62%, normal LV diastolic function normal RV function, mild MR, mild-moderate TR, and moderate pulmonary hypertension.  She had multiple hospital admissions.  She ended up being readmitted on 3/24 with fever and chills.  UA was negative for UTI.  Her temperature on arrival was 103.2.  She was tachycardic and hypertensive.  CT of her abdomen pelvis was negative.  She was started on broad-spectrum antibiotics.  Her blood cultures were positive for cocci and later grew strep pyogenes.  Her port was removed.  Echocardiogram was negative for endocarditis or valvular vegetation.  Her EF was noted to be 55 to 60%.  She was noted to have mild LVH.  She was seen in follow-up by Hao Meng PA-C on  01/27/2023.  During that time she noted worsening shortness of breath for 2 weeks.  She complained of chills.  On exam her lungs were noted to be clear.  She was noted to have a mildly tachycardic heart rate.  She was maintaining sinus rhythm.  Secondary workup were was recommended with BMP, CBC, BNP, D-dimer.  Her lab work was unremarkable.  She presents to the clinic today for follow-up evaluation and states she notes a full type  sensation in her throat.  This is constant.  She notes that it improves with drinking soft drinks.  She also notes ongoing shortness of breath with physical activity.  We reviewed her previous cardiac history and most recent blood work.  She expressed understanding.  She has chronic low hematocrit and hemoglobin.  She reports that she has not had a colonoscopy.  I will refill her cardiac medications and provide referral to GI for consideration of colonoscopy.  I will have her continue heart healthy low-sodium diet and plan follow-up in 12 months..  Today she denies chest pain,  lower extremity edema, fatigue, palpitations, melena, hematuria, hemoptysis, diaphoresis, weakness, presyncope, syncope, orthopnea, and PND.    Home Medications    Prior to Admission medications   Medication Sig Start Date End Date Taking? Authorizing Provider  carvedilol  (COREG ) 12.5 MG tablet TAKE 1 TABLET(12.5 MG) BY MOUTH TWICE DAILY WITH A MEAL 12/21/23   Newlin, Enobong, MD  ENTRESTO  24-26 MG TAKE 1 TABLET BY MOUTH TWICE DAILY 12/21/23   Newlin, Enobong, MD  famotidine  (PEPCID ) 40 MG tablet Take 1 tablet (40 mg total) by mouth daily. 09/15/23   Fleming, Zelda W, NP  FARXIGA  10 MG TABS tablet Take 1 tablet (10 mg total) by mouth daily. 09/22/23   McClung, Angela M, PA-C  HYDROcodone -acetaminophen  (NORCO/VICODIN) 5-325 MG tablet Take 1 tablet by mouth every 8 (eight) hours as needed for moderate pain (pain score 4-6). 02/14/24   Urbano Albright, MD  spironolactone  (ALDACTONE ) 25 MG tablet Take 1 tablet (25 mg total) by mouth daily. 09/15/23   Theotis Haze ORN, NP    Family History    Family History  Problem Relation Age of Onset   Heart failure Neg Hx    Heart disease Neg Hx    Heart attack Neg Hx    Colon polyps Neg Hx    Colon cancer Neg Hx    Esophageal cancer Neg Hx    Stomach cancer Neg Hx    Rectal cancer Neg Hx    She indicated that her mother is alive. She indicated that her father is deceased. She  indicated that the status of her neg hx is unknown.  Social History    Social History   Socioeconomic History   Marital status: Widowed    Spouse name: Not on file   Number of children: Not on file   Years of education: Not on file   Highest education level: Not on file  Occupational History   Not on file  Tobacco Use   Smoking status: Former    Current packs/day: 0.00    Types: Cigarettes    Start date: 71    Quit date: 2000    Years since quitting: 25.7   Smokeless tobacco: Never  Vaping Use   Vaping status: Never Used  Substance and Sexual Activity   Alcohol use: Not Currently   Drug use: Never   Sexual activity: Not on file  Other Topics Concern   Not on file  Social History  Narrative   Not on file   Social Drivers of Health   Financial Resource Strain: Low Risk  (02/27/2022)   Received from [facility], [facility]   Overall Financial Resource Strain (CARDIA)    Difficulty of Paying Living Expenses: Not hard at all  Food Insecurity: No Food Insecurity (07/30/2022)   Hunger Vital Sign    Worried About Running Out of Food in the Last Year: Never true    Ran Out of Food in the Last Year: Never true  Transportation Needs: No Transportation Needs (07/30/2022)   PRAPARE - Administrator, Civil Service (Medical): No    Lack of Transportation (Non-Medical): No  Physical Activity: Not on file  Stress: Stress Concern Present (02/04/2021)   [employer] of Occupational Health - Occupational Stress Questionnaire    Feeling of Stress : To some extent  Social Connections: Unknown (07/14/2022)   [facility]   Social Network    Social Network: Not on file  Intimate Partner Violence: Not At Risk (07/30/2022)   Humiliation, Afraid, Rape, and Kick questionnaire    Fear of Current or Ex-Partner: No    Emotionally Abused: No    Physically Abused: No    Sexually Abused: No     Review of Systems    General:  No chills,  fever, night sweats or weight changes.  Cardiovascular:  No chest pain, dyspnea on exertion, edema, orthopnea, palpitations, paroxysmal nocturnal dyspnea. Dermatological: No rash, lesions/masses Respiratory: No cough, dyspnea Urologic: No hematuria, dysuria Abdominal:   No nausea, vomiting, diarrhea, bright red blood per rectum, melena, or hematemesis Neurologic:  No visual changes, wkns, changes in mental status. All other systems reviewed and are otherwise negative except as noted above.  Physical Exam    VS:  BP (!) 142/84   Pulse 80   Ht 5' 6 (1.676 m)   Wt 205 lb (93 kg)   SpO2 99%   BMI 33.09 kg/m  , BMI Body mass index is 33.09 kg/m. GEN: Well nourished, well developed, in no acute distress. HEENT: normal. Neck: Supple, no JVD, carotid bruits, or masses. Cardiac: RRR, no murmurs, rubs, or gallops. No clubbing, cyanosis, edema.  Radials/DP/PT 2+ and equal bilaterally.  Respiratory:  Respirations regular and unlabored, clear to auscultation bilaterally. GI: Soft, nontender, nondistended, BS + x 4. MS: no deformity or atrophy. Skin: warm and dry, no rash. Neuro:  Strength and sensation are intact. Psych: Normal affect.  Accessory Clinical Findings    Recent Labs: 01/19/2024: ALT 14; BUN 18; Creatinine 0.94; Hemoglobin 9.3; Platelet Count 157; Potassium 4.7; Sodium 134   Recent Lipid Panel    Component Value Date/Time   CHOL 92 (L) 12/21/2022 1438   TRIG 50 12/21/2022 1438   HDL 41 12/21/2022 1438   CHOLHDL 2.2 12/21/2022 1438   CHOLHDL 2.8 10/05/2020 0108   VLDL 8 10/05/2020 0108   LDLCALC 39 12/21/2022 1438    HYPERTENSION CONTROL Vitals:   02/18/24 1527 02/18/24 1645  BP: (!) 154/86 (!) 142/84    The patient's blood pressure is elevated above target today.  In order to address the patient's elevated BP: Blood pressure will be monitored at home to determine if medication changes need to be made.       ECG personally reviewed by me today- EKG  Interpretation Date/Time:  Friday February 18 2024 16:52:05 EDT Ventricular Rate:  65 PR Interval:  134 QRS Duration:  92 QT Interval:  388 QTC  Calculation: 403 R Axis:   31  Text Interpretation: Normal sinus rhythm Moderate voltage criteria for LVH, may be normal variant ( R in aVL , Sokolow-Lyon ) When compared with ECG of 06-Mar-2023 13:22, PREVIOUS ECG IS PRESENT Confirmed by Emelia Hazy [phone]) on 02/18/2024 4:54:14 PM    Echocardiogram 07/31/2022   IMPRESSIONS     1. Left ventricular ejection fraction, by estimation, is 55 to 60%. The  left ventricle has normal function. The left ventricle has no regional  wall motion abnormalities. There is mild concentric left ventricular  hypertrophy. Left ventricular diastolic  parameters were normal.   2. Right ventricular systolic function is normal. The right ventricular  size is normal. Tricuspid regurgitation signal is inadequate for assessing  PA pressure.   3. The mitral valve is normal in structure. Trivial mitral valve  regurgitation. No evidence of mitral stenosis.   4. The aortic valve is tricuspid. Aortic valve regurgitation is not  visualized. No aortic stenosis is present.   5. The inferior vena cava is dilated in size with >50% respiratory  variability, suggesting right atrial pressure of 8 mmHg.   Comparison(s): Prior images reviewed side by side. Changes from prior  study are noted. EF improved compared to prior.   Conclusion(s)/Recommendation(s): No evidence of valvular vegetations on  this transthoracic echocardiogram. Consider a transesophageal  echocardiogram to exclude infective endocarditis if clinically indicated.   FINDINGS   Left Ventricle: Left ventricular ejection fraction, by estimation, is 55  to 60%. The left ventricle has normal function. The left ventricle has no  regional wall motion abnormalities. The left ventricular internal cavity  size was normal in size. There is   mild concentric left  ventricular hypertrophy. Left ventricular diastolic  parameters were normal.   Right Ventricle: The right ventricular size is normal. No increase in  right ventricular wall thickness. Right ventricular systolic function is  normal. Tricuspid regurgitation signal is inadequate for assessing PA  pressure.   Left Atrium: Left atrial size was normal in size.   Right Atrium: Right atrial size was normal in size.   Pericardium: There is no evidence of pericardial effusion.   Mitral Valve: The mitral valve is normal in structure. Trivial mitral  valve regurgitation. No evidence of mitral valve stenosis.   Tricuspid Valve: The tricuspid valve is normal in structure. Tricuspid  valve regurgitation is trivial. No evidence of tricuspid stenosis.   Aortic Valve: The aortic valve is tricuspid. Aortic valve regurgitation is  not visualized. No aortic stenosis is present.   Pulmonic Valve: The pulmonic valve was not well visualized. Pulmonic valve  regurgitation is not visualized. No evidence of pulmonic stenosis.   Aorta: The aortic root, ascending aorta, aortic arch and descending aorta  are all structurally normal, with no evidence of dilitation or  obstruction.   Venous: The inferior vena cava is dilated in size with greater than 50%  respiratory variability, suggesting right atrial pressure of 8 mmHg.   IAS/Shunts: The atrial septum is grossly normal.      Assessment & Plan   1.  HFrEF-euvolemic today.  Denies increased work of breathing or activity intolerance.  Echocardiogram 3/24 showed an LVEF of 55-60% and mild LVH. Heart healthy low-sodium diet Increase physical activity as tolerated Elevate lower extremities when not active Continue current medical therapy Order CBC, BMP  Essential hypertension-BP today 142/84. Maintain blood pressure log Heart healthy low-sodium diet  History of breast cancer-underwent chemotherapy and radiation for metastatic breast cancer.  Feels  she has recovered well from this.  Currently in remission. Following with PCP/oncology  Chronic low hemoglobin hematocrit-noted to be 9.3 and 29.2 on 01/19/2024.  Increase iron in diet, supplement with vitamin C.  She denies bleeding issues. Ambulatory referral to GI for colonoscopy screening   Disposition: Follow-up with Dr. Francyne or me in 9-12 months.   Josefa HERO. Cheyrl Buley NP-C     02/18/2024, 4:57 PM [facility] [address] [place], [place] [address] Office [phone]    Notice: This dictation was prepared with Dragon dictation along with smaller phrase technology. Any transcriptional errors that result from this process are unintentional and may not be corrected upon review.   I spent 14 minutes examining this patient, reviewing medications, and using patient centered shared decision making involving their cardiac care.   I spent  20 minutes reviewing past medical history,  medications, and prior cardiac tests.

## 2024-02-17 LAB — DRUG TOX MONITOR 1 W/CONF, ORAL FLD
Amphetamines: NEGATIVE ng/mL (ref <10)
Barbiturates: NEGATIVE ng/mL (ref <10)
Benzodiazepines: NEGATIVE ng/mL (ref <0.50)
Buprenorphine: NEGATIVE ng/mL (ref <0.10)
Cocaine: NEGATIVE ng/mL (ref <5.0)
Codeine: NEGATIVE ng/mL (ref <2.5)
Dihydrocodeine: 5.8 ng/mL — ABNORMAL HIGH (ref <2.5)
Fentanyl: NEGATIVE ng/mL (ref <0.10)
Heroin Metabolite: NEGATIVE ng/mL (ref <1.0)
Hydrocodone: 18.6 ng/mL — ABNORMAL HIGH (ref <2.5)
Hydromorphone: NEGATIVE ng/mL (ref <2.5)
MARIJUANA: NEGATIVE ng/mL (ref <2.5)
MDMA: NEGATIVE ng/mL (ref <10)
Meprobamate: NEGATIVE ng/mL (ref <2.5)
Methadone: NEGATIVE ng/mL (ref <5.0)
Morphine: NEGATIVE ng/mL (ref <2.5)
Nicotine Metabolite: NEGATIVE ng/mL (ref <5.0)
Norhydrocodone: NEGATIVE ng/mL (ref <2.5)
Noroxycodone: NEGATIVE ng/mL (ref <2.5)
Opiates: POSITIVE ng/mL — AB (ref <2.5)
Oxycodone: NEGATIVE ng/mL (ref <2.5)
Oxymorphone: NEGATIVE ng/mL (ref <2.5)
Phencyclidine: NEGATIVE ng/mL (ref <10)
Tapentadol: NEGATIVE ng/mL (ref <5.0)
Tramadol: NEGATIVE ng/mL (ref <5.0)
Zolpidem: NEGATIVE ng/mL (ref <5.0)

## 2024-02-17 LAB — DRUG TOX ALC METAB W/CON, ORAL FLD: Alcohol Metabolite: NEGATIVE ng/mL (ref <25)

## 2024-02-18 ENCOUNTER — Ambulatory Visit: Attending: General Practice | Admitting: General Practice

## 2024-02-18 ENCOUNTER — Encounter: Payer: Self-pay | Admitting: General Practice

## 2024-02-18 VITALS — BP 142/84 | HR 80 | Ht 66.0 in | Wt 205.0 lb

## 2024-02-18 DIAGNOSIS — D649 Anemia, unspecified: Secondary | ICD-10-CM

## 2024-02-18 DIAGNOSIS — C773 Secondary and unspecified malignant neoplasm of axilla and upper limb lymph nodes: Secondary | ICD-10-CM | POA: Diagnosis not present

## 2024-02-18 DIAGNOSIS — C50911 Malignant neoplasm of unspecified site of right female breast: Secondary | ICD-10-CM | POA: Diagnosis not present

## 2024-02-18 DIAGNOSIS — I509 Heart failure, unspecified: Secondary | ICD-10-CM | POA: Diagnosis not present

## 2024-02-18 DIAGNOSIS — I5020 Unspecified systolic (congestive) heart failure: Secondary | ICD-10-CM

## 2024-02-18 DIAGNOSIS — I10 Essential (primary) hypertension: Secondary | ICD-10-CM

## 2024-02-18 MED ORDER — ENTRESTO 24-26 MG PO TABS: 1.0000 | ORAL_TABLET | Freq: Two times a day (BID) | ORAL | 3 refills | Status: AC

## 2024-02-18 MED ORDER — CARVEDILOL 12.5 MG PO TABS: 12.5000 mg | ORAL_TABLET | Freq: Two times a day (BID) | ORAL | 3 refills | Status: AC

## 2024-02-18 MED ORDER — SPIRONOLACTONE 25 MG PO TABS: 25.0000 mg | ORAL_TABLET | Freq: Every day | ORAL | 3 refills | Status: DC

## 2024-02-18 MED ORDER — FARXIGA 10 MG PO TABS: 10.0000 mg | ORAL_TABLET | Freq: Every day | ORAL | 3 refills | Status: AC

## 2024-02-18 NOTE — Progress Notes (Signed)
 Reports that she has not taken her blood pressure medication yet today.

## 2024-02-18 NOTE — Patient Instructions (Signed)
 Medication Instructions:  Your physician recommends that you continue on your current medications as directed. Please refer to the Current Medication list given to you today. *If you need a refill on your cardiac medications before your next appointment, please call your pharmacy*  Lab Work: None ordered If you have labs (blood work) drawn today and your tests are completely normal, you will receive your results only by: MyChart Message (if you have MyChart) OR A paper copy in the mail If you have any lab test that is abnormal or we need to change your treatment, we will call you to review the results.  Testing/Procedures: None ordered  Follow-Up: At [facility], you and your health needs are our priority.  As part of our continuing mission to provide you with exceptional heart care, our providers are all part of one team.  This team includes your primary Cardiologist (physician) and Advanced Practice Providers or APPs (Physician Assistants and Nurse Practitioners) who all work together to provide you with the care you need, when you need it.  Your next appointment:   12 month(s)  Provider:   Jerel Balding, MD    We recommend signing up for the patient portal called MyChart.  Sign up information is provided on this After Visit Summary.  MyChart is used to connect with patients for Virtual Visits (Telemedicine).  Patients are able to view lab/test results, encounter notes, upcoming appointments, etc.  Non-urgent messages can be sent to your provider as well.   To learn more about what you can do with MyChart, go to [email or web address].   Other Instructions You have been referred to GI referral.

## 2024-03-06 ENCOUNTER — Other Ambulatory Visit: Payer: Self-pay | Admitting: Family Medicine

## 2024-03-06 DIAGNOSIS — I10 Essential (primary) hypertension: Secondary | ICD-10-CM

## 2024-03-06 DIAGNOSIS — I509 Heart failure, unspecified: Secondary | ICD-10-CM

## 2024-03-08 ENCOUNTER — Ambulatory Visit: Payer: Self-pay

## 2024-03-08 NOTE — Telephone Encounter (Signed)
 Patient reports continued fatigue and weakness. Patient also endorses a sore lump to her right breast. Patient is wanting an appointment with PCP. Patient is needing a call back to schedule. Patient was given mobile bus information to be seen sooner if she wises. Patient is needing a call back from office.   FYI Only or Action Required?: Action required by provider: request for appointment.  Patient was last seen in primary care on 09/22/2023 by Marie Jon HERO, PA-C.  Called Nurse Triage reporting Breast Problem and Fatigue.  Symptoms began several weeks ago.  Interventions attempted: Rest, hydration, or home remedies.  Symptoms are: unchanged.  Triage Disposition: See PCP Within 2 Weeks, See PCP When Office is Open (Within 3 Days)  Patient/caregiver understands and will follow disposition?: Yes  Copied from CRM [id number]. Topic: Clinical - Red Word Triage >> Mar 08, 2024 12:55 PM Dedra B wrote: [facility] that prompted transfer to Nurse Triage: Pt experiencing weakness, severe fatigue, and sore lump in R breast. Warm transfer to NT. Reason for Disposition  Breast lump  Fatigue is a chronic symptom (recurrent or ongoing AND present > 4 weeks)  Answer Assessment - Initial Assessment Questions 1. SYMPTOM: What's the main symptom you're concerned about?  (e.g., lump, nipple discharge, pain, rash)     lump 2. LOCATION: Where is the lump located?     Right breast 3. ONSET: When did breast lump  start?     Couple of days 4. PRIOR HISTORY: Do you have any history of prior problems with your breasts? (e.g., breast cancer, breast implant, fibrocystic breast disease)     Hx of breast cancer 5. CAUSE: What do you think is causing this symptom?     unsure 6. OTHER SYMPTOMS: Do you have any other symptoms? (e.g., breast pain, fever, nipple discharge, redness or rash)     Fatigue and weakness  Answer Assessment - Initial Assessment Questions 1. DESCRIPTION: Describe how you are  feeling.     Very fatigued 2. SEVERITY: How bad is it?  Can you stand and walk?     Patient is having to take rests in between doing things 3. ONSET: When did these symptoms begin? (e.g., hours, days, weeks, months)     A couple of months which has worsened.  4. CAUSE: What do you think is causing the weakness or fatigue? (e.g., not drinking enough fluids, medical problem, trouble sleeping)     Medical problem 5. NEW MEDICINES:  Have you started on any new medicines recently? (e.g., opioid pain medicines, benzodiazepines, muscle relaxants, antidepressants, antihistamines, neuroleptics, beta blockers)     no 6. OTHER SYMPTOMS: Do you have any other symptoms? (e.g., chest pain, fever, cough, SOB, vomiting, diarrhea, bleeding, other areas of pain)     no  Protocols used: Breast Symptoms-A-AH, Weakness (Generalized) and Fatigue-A-AH

## 2024-03-09 ENCOUNTER — Encounter: Payer: Self-pay | Admitting: Oncology

## 2024-03-09 ENCOUNTER — Inpatient Hospital Stay

## 2024-03-09 ENCOUNTER — Other Ambulatory Visit: Payer: Self-pay

## 2024-03-09 ENCOUNTER — Inpatient Hospital Stay: Attending: Oncology | Admitting: Oncology

## 2024-03-09 VITALS — BP 153/78 | HR 99 | Temp 97.6°F | Resp 17 | Ht 66.0 in | Wt 197.6 lb

## 2024-03-09 DIAGNOSIS — C773 Secondary and unspecified malignant neoplasm of axilla and upper limb lymph nodes: Secondary | ICD-10-CM

## 2024-03-09 DIAGNOSIS — C50911 Malignant neoplasm of unspecified site of right female breast: Secondary | ICD-10-CM

## 2024-03-09 DIAGNOSIS — R778 Other specified abnormalities of plasma proteins: Secondary | ICD-10-CM

## 2024-03-09 DIAGNOSIS — Z923 Personal history of irradiation: Secondary | ICD-10-CM | POA: Diagnosis not present

## 2024-03-09 DIAGNOSIS — D72819 Decreased white blood cell count, unspecified: Secondary | ICD-10-CM

## 2024-03-09 DIAGNOSIS — D649 Anemia, unspecified: Secondary | ICD-10-CM | POA: Insufficient documentation

## 2024-03-09 DIAGNOSIS — Z853 Personal history of malignant neoplasm of breast: Secondary | ICD-10-CM | POA: Insufficient documentation

## 2024-03-09 DIAGNOSIS — Z9221 Personal history of antineoplastic chemotherapy: Secondary | ICD-10-CM | POA: Insufficient documentation

## 2024-03-09 DIAGNOSIS — R7689 Other specified abnormal immunological findings in serum: Secondary | ICD-10-CM | POA: Insufficient documentation

## 2024-03-09 LAB — CBC WITH DIFFERENTIAL (CANCER CENTER ONLY)
Abs Immature Granulocytes: 0.2 K/uL — ABNORMAL HIGH (ref 0.00–0.07)
Basophils Absolute: 0 K/uL (ref 0.0–0.1)
Basophils Relative: 0 %
Eosinophils Absolute: 0 K/uL (ref 0.0–0.5)
Eosinophils Relative: 1 %
HCT: 25.8 % — ABNORMAL LOW (ref 36.0–46.0)
Hemoglobin: 8.3 g/dL — ABNORMAL LOW (ref 12.0–15.0)
Immature Granulocytes: 6 %
Lymphocytes Relative: 38 %
Lymphs Abs: 1.4 K/uL (ref 0.7–4.0)
MCH: 29.5 pg (ref 26.0–34.0)
MCHC: 32.2 g/dL (ref 30.0–36.0)
MCV: 91.8 fL (ref 80.0–100.0)
Monocytes Absolute: 0.2 K/uL (ref 0.1–1.0)
Monocytes Relative: 5 %
Neutro Abs: 1.9 K/uL (ref 1.7–7.7)
Neutrophils Relative %: 50 %
Platelet Count: 164 K/uL (ref 150–400)
RBC: 2.81 MIL/uL — ABNORMAL LOW (ref 3.87–5.11)
RDW: 18.7 % — ABNORMAL HIGH (ref 11.5–15.5)
WBC Count: 3.7 K/uL — ABNORMAL LOW (ref 4.0–10.5)
nRBC: 0.5 % — ABNORMAL HIGH (ref 0.0–0.2)

## 2024-03-09 LAB — CMP (CANCER CENTER ONLY)
ALT: 11 U/L (ref 0–44)
AST: 17 U/L (ref 15–41)
Albumin: 3.8 g/dL (ref 3.5–5.0)
Alkaline Phosphatase: 81 U/L (ref 38–126)
Anion gap: 4 — ABNORMAL LOW (ref 5–15)
BUN: 14 mg/dL (ref 8–23)
CO2: 26 mmol/L (ref 22–32)
Calcium: 8.9 mg/dL (ref 8.9–10.3)
Chloride: 107 mmol/L (ref 98–111)
Creatinine: 0.8 mg/dL (ref 0.44–1.00)
GFR, Estimated: >60 mL/min (ref >60)
Glucose, Bld: 124 mg/dL — ABNORMAL HIGH (ref 70–99)
Potassium: 4.1 mmol/L (ref 3.5–5.1)
Sodium: 137 mmol/L (ref 135–145)
Total Bilirubin: 0.4 mg/dL (ref 0.0–1.2)
Total Protein: 8.9 g/dL — ABNORMAL HIGH (ref 6.5–8.1)

## 2024-03-09 LAB — IRON AND IRON BINDING CAPACITY (CC-WL,HP ONLY)
Iron: 73 ug/dL (ref 28–170)
Saturation Ratios: 25 % (ref 10.4–31.8)
TIBC: 290 ug/dL (ref 250–450)
UIBC: 217 ug/dL (ref 148–442)

## 2024-03-09 LAB — FERRITIN: Ferritin: 345 ng/mL — ABNORMAL HIGH (ref 11–307)

## 2024-03-09 NOTE — Assessment & Plan Note (Addendum)
 Chronic anemia with low hemoglobin levels. Hemoglobin was 11.2 in December 2024. Chemotherapy for breast cancer may have contributed. No current iron supplementation.   On her initial consultation with us  on 06/18/2023, labs revealed persistent anemia with hemoglobin of 10.2, hematocrit 32.8, MCV 94.5.  White count 3400 with normal differential, ANC of 1500.  Platelet count of 160,000.  CMP unremarkable except for total protein of 9.2 g/dL.  Creatinine was normal at 0.9, calcium normal at 9.4, albumin 4.1. Iron studies, ferritin, B12, folate, serum copper , LDH were within normal limits.   SPEP showed no evidence of M spike.  IFE showed polyclonal increase detected in one or more immunoglobulins.  Serum free kappa was elevated at 118 mg/L, serum free lambda was elevated at 33.6 mg/L, ratio elevated at 3.54.  Quantitative IgG was increased at 3368 mg/dL, IgA was also slightly increased at 375 mg/dL and IgM was also increased at 245 mg/dL.  Overall picture was not consistent with monoclonal gammopathy but rather polyclonal increase in proteins from uncertain etiology.  Normal renal function and normal calcium.   Labs today showed hemoglobin of 8.3, MCV 91.8.   Anemia seems to be from anemia of chronic disease at this time.  No indication for transfusion at current levels.  Plan to transfuse if hemoglobin is below 7.  Will continue surveillance.

## 2024-03-09 NOTE — Assessment & Plan Note (Addendum)
 Diagnosed in November 2021 and managed at [place], [place].  ER negative, PR negative, HER2 positive.  Originally plan was to treat her with [facility] regimen every 3 weeks for 6 cycles as part of neoadjuvant treatment.     C1 HP only due to poor PS. 05/16/20 added weekly Taxol. (THP)  She had Herceptin 5/13 and then was admitted with CHF [facility]) prior to her next treatment   She ended up having lumpectomy 11/2020 - 6 foci of IDC, largest 4mm but did have LVI and 2/18 nodes  12/2020 re-excision of positive margin   Completed 5 cycles Kadcyla 06/20/2021 - stopped due to low PLT   HER2 positive status has a slightly higher recurrence risk  Reports right breast pain and a palpable hard area, distinct from usual post-surgical puffiness, present for two weeks. Located above the scar tissue from previous breast cancer surgery in November 2021.   Last mammogram in January 2025 was normal.   New symptoms warrant further imaging to rule out recurrence or other pathology. - Order urgent diagnostic mammogram and ultrasound of the right breast  I will plan to call her in 2 weeks with results and we will workup further as indicated.

## 2024-03-09 NOTE — Assessment & Plan Note (Deleted)
 Chronic anemia with low hemoglobin levels. Hemoglobin was 11.2 in December 2024. Chemotherapy for breast cancer may have contributed. No current iron supplementation.   On her initial consultation with us  on 06/18/2023, labs revealed persistent anemia with hemoglobin of 10.2, hematocrit 32.8, MCV 94.5.  White count 3400 with normal differential, ANC of 1500.  Platelet count of 160,000.  CMP unremarkable except for total protein of 9.2 g/dL.  Creatinine was normal at 0.9, calcium normal at 9.4, albumin 4.1. Iron studies, ferritin, B12, folate, serum copper , LDH were within normal limits.   SPEP showed no evidence of M spike.  IFE showed polyclonal increase detected in one or more immunoglobulins.  Serum free kappa was elevated at 118 mg/L, serum free lambda was elevated at 33.6 mg/L, ratio elevated at 3.54.  Quantitative IgG was increased at 3368 mg/dL, IgA was also slightly increased at 375 mg/dL and IgM was also increased at 245 mg/dL.  Overall picture was not consistent with monoclonal gammopathy but rather polyclonal increase in proteins from uncertain etiology.  Normal renal function and normal calcium.   Labs today showed stable hemoglobin of 9.3, MCV 92.1.  White count 3500 with normal differential and normal platelet count of 157,000.  Anemia seems to be from anemia of chronic disease at this time.  No indication for transfusion at current levels.  Plan to transfuse if hemoglobin is below 7.  Will continue surveillance.

## 2024-03-09 NOTE — Assessment & Plan Note (Signed)
 As part of leukopenia workup, on 09/24/2023 we checked ANA and it was positive with reflex testing positive for anti-Ro and anti-La antibodies.  Rheumatoid factor was also elevated at 226 IU/mL.  Concern for underlying connective tissue disorder versus RA.  Referral sent to rheumatology for further evaluation.

## 2024-03-09 NOTE — Assessment & Plan Note (Deleted)
 Leukopenia with a decrease in white blood cell count from 3400 to 2900 cells/L. No signs of infection such as fever, chills, or respiratory symptoms. Possible causes include recent urinary tract infection or stressors affecting bone marrow function. Past chemotherapy may also be a contributing factor. - Repeat blood work in 6 weeks to monitor white blood cell count - Consider bone marrow biopsy if white blood cell count continues to decrease

## 2024-03-09 NOTE — Assessment & Plan Note (Signed)
 Chronic, slightly elevated total protein of uncertain significance.    On her initial consultation with us  on 06/18/2023, labs revealed total protein of 9.2 g/dL.  Creatinine was normal at 0.9, calcium normal at 9.4, albumin 4.1.  Hemoglobin was stable at 10.2.   SPEP showed no evidence of M spike.  IFE showed polyclonal increase detected in one or more immunoglobulins.  Serum free kappa was elevated at 118 mg/L, serum free lambda was elevated at 33.6 mg/L, ratio elevated at 3.54.  Quantitative IgG was increased at 3368 mg/dL, IgA was also slightly increased at 375 mg/dL and IgM was also increased at 245 mg/dL.  Overall picture is not consistent with monoclonal gammopathy but rather polyclonal increase in proteins from uncertain etiology.  Normal renal function and normal calcium.    Total protein is slightly elevated at 9.9 today.  No hematological intervention is warranted at current levels.  On 09/24/2023, ANA was positive with reflex testing positive for anti-Ro and anti-La antibodies.  Rheumatoid factor was elevated at 266 IU/mL.  ESR was elevated at 123 mm/h.  CRP borderline at 1.  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  IFE showed polyclonal increase.  Both kappa and lambda were increased with ratio of 3.3, consistent with chronic disease picture.  No evidence of monoclonal gammopathy.  Elevated total protein seems to be polyclonal from underlying inflammatory conditions.  Will continue observation at this time.

## 2024-03-09 NOTE — Assessment & Plan Note (Addendum)
 Fluctuating leukopenia.  White blood cell count is currently 3,700, up from 3,000 previously, still below normal range. No signs of infection such as fever, chills, or respiratory symptoms.  Positive ANA.  Elevated rheumatoid factor.  This could be contributing to leukopenia.  Past chemotherapy may also be a contributing factor.  - Consider bone marrow biopsy if white blood cell count continues to decrease

## 2024-03-09 NOTE — Telephone Encounter (Signed)
 Patient identified by name and date of birth.  Patient did not want to wait until December to be seen and already has information for the mobile unit.

## 2024-03-09 NOTE — Progress Notes (Signed)
 [facility]  HEMATOLOGY CLINIC PROGRESS NOTE  PATIENT NAME: [facility]   MR#: [id number] DOB: 1956/01/04  Patient Care Team: Theotis Haze ORN, NP as PCP - General (Nurse Practitioner) Francyne Headland, MD as PCP - Cardiology (Cardiology)  Date of visit: 03/09/2024   ASSESSMENT & PLAN:   [facility] is a 68 y.o. lady with a past medical history of stage II B right-sided breast cancer, ER negative, PR negative, HER2/neu positive, diagnosed in November 2021 and treated at [place], [place], hypertension, diabetes mellitus type 2, chronic thrombocytopenia, CHF from chemotherapy, which resolved, was referred to our service in January 2025 for evaluation of anemia and elevated total protein.     Breast cancer metastasized to axillary lymph node, right [facility]) Diagnosed in November 2021 and managed at [place], [place].  ER negative, PR negative, HER2 positive.  Originally plan was to treat her with [facility] regimen every 3 weeks for 6 cycles as part of neoadjuvant treatment.     C1 HP only due to poor PS. 05/16/20 added weekly Taxol. (THP)  She had Herceptin 5/13 and then was admitted with CHF [facility]) prior to her next treatment   She ended up having lumpectomy 11/2020 - 6 foci of IDC, largest 4mm but did have LVI and 2/18 nodes  12/2020 re-excision of positive margin   Completed 5 cycles Kadcyla 06/20/2021 - stopped due to low PLT   HER2 positive status has a slightly higher recurrence risk  Reports right breast pain and a palpable hard area, distinct from usual post-surgical puffiness, present for two weeks. Located above the scar tissue from previous breast cancer surgery in November 2021.   Last mammogram in January 2025 was normal.   New symptoms warrant further imaging to rule out recurrence or other pathology. - Order urgent diagnostic mammogram and ultrasound of the right breast  I will plan to call her in 2 weeks with results and we will workup  further as indicated.  Elevated total protein Chronic, slightly elevated total protein of uncertain significance.    On her initial consultation with us  on 06/18/2023, labs revealed total protein of 9.2 g/dL.  Creatinine was normal at 0.9, calcium normal at 9.4, albumin 4.1.  Hemoglobin was stable at 10.2.   SPEP showed no evidence of M spike.  IFE showed polyclonal increase detected in one or more immunoglobulins.  Serum free kappa was elevated at 118 mg/L, serum free lambda was elevated at 33.6 mg/L, ratio elevated at 3.54.  Quantitative IgG was increased at 3368 mg/dL, IgA was also slightly increased at 375 mg/dL and IgM was also increased at 245 mg/dL.  Overall picture is not consistent with monoclonal gammopathy but rather polyclonal increase in proteins from uncertain etiology.  Normal renal function and normal calcium.    Total protein is slightly elevated at 9.9 today.  No hematological intervention is warranted at current levels.  On 09/24/2023, ANA was positive with reflex testing positive for anti-Ro and anti-La antibodies.  Rheumatoid factor was elevated at 266 IU/mL.  ESR was elevated at 123 mm/h.  CRP borderline at 1.  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  IFE showed polyclonal increase.  Both kappa and lambda were increased with ratio of 3.3, consistent with chronic disease picture.  No evidence of monoclonal gammopathy.  Elevated total protein seems to be polyclonal from underlying inflammatory conditions.  Will continue observation at this time.  ANA positive As part of  leukopenia workup, on 09/24/2023 we checked ANA and it was positive with reflex testing positive for anti-Ro and anti-La antibodies.  Rheumatoid factor was also elevated at 226 IU/mL.  Concern for underlying connective tissue disorder versus RA.  Referral sent to rheumatology for further evaluation.  Normocytic anemia Chronic anemia with low hemoglobin levels.  Hemoglobin was 11.2 in December 2024. Chemotherapy for breast cancer may have contributed. No current iron supplementation.   On her initial consultation with us  on 06/18/2023, labs revealed persistent anemia with hemoglobin of 10.2, hematocrit 32.8, MCV 94.5.  White count 3400 with normal differential, ANC of 1500.  Platelet count of 160,000.  CMP unremarkable except for total protein of 9.2 g/dL.  Creatinine was normal at 0.9, calcium normal at 9.4, albumin 4.1. Iron studies, ferritin, B12, folate, serum copper , LDH were within normal limits.   SPEP showed no evidence of M spike.  IFE showed polyclonal increase detected in one or more immunoglobulins.  Serum free kappa was elevated at 118 mg/L, serum free lambda was elevated at 33.6 mg/L, ratio elevated at 3.54.  Quantitative IgG was increased at 3368 mg/dL, IgA was also slightly increased at 375 mg/dL and IgM was also increased at 245 mg/dL.  Overall picture was not consistent with monoclonal gammopathy but rather polyclonal increase in proteins from uncertain etiology.  Normal renal function and normal calcium.   Labs today showed hemoglobin of 8.3, MCV 91.8.   Anemia seems to be from anemia of chronic disease at this time.  No indication for transfusion at current levels.  Plan to transfuse if hemoglobin is below 7.  Will continue surveillance.   Leukopenia Fluctuating leukopenia.  White blood cell count is currently 3,700, up from 3,000 previously, still below normal range. No signs of infection such as fever, chills, or respiratory symptoms.  Positive ANA.  Elevated rheumatoid factor.  This could be contributing to leukopenia.  Past chemotherapy may also be a contributing factor.  - Consider bone marrow biopsy if white blood cell count continues to decrease    I spent a total of 30 minutes during this encounter with the patient including review of chart and various tests results, discussions about plan of care and coordination of care  plan.  I reviewed lab results and outside records for this visit and discussed relevant results with the patient. Diagnosis, plan of care and treatment options were also discussed in detail with the patient. Opportunity provided to ask questions and answers provided to her apparent satisfaction. Provided instructions to call our clinic with any problems, questions or concerns prior to return visit. I recommended to continue follow-up with PCP and sub-specialists. She verbalized understanding and agreed with the plan. No barriers to learning was detected.  Chinita Patten, MD  03/09/2024 11:47 AM  [facility] - A DEPT OF [facility] [address] [place] [address] Dept: [phone] Dept Fax: [phone]   CHIEF COMPLAINT/ REASON FOR VISIT:  Follow-up for history of normocytic anemia, elevated total protein, stage II B right-sided breast cancer diagnosed in 2021 and managed at outside facility in [place], [place].   INTERVAL HISTORY:  Discussed the use of AI scribe software for clinical note transcription with the patient, who gave verbal consent to proceed.  History of Present Illness  [facility] is a 68 year old female with breast cancer who presents with right breast pain and swelling.  She experiences pain in her right breast,  described as sore with a hard, puffy area. This area has been present since her breast cancer surgery in November 2021, but now exhibits increased hardness and pain. The pain has persisted for about two weeks.  She underwent chemotherapy and Herceptin treatment for one year following her surgery. Her last mammogram in January 2025 showed no abnormalities.  She also reports a sore throat with pain radiating to her head, which she attributes to a possible cold that started the previous day.  Her blood work shows a white blood cell count of 3,700, slightly improved from 3,500 previously.  Her hemoglobin level is 8.3, fluctuating from a previous level of 9.3. Platelet levels are normal.  She has a history of lymph node removal, which she associates with soreness under her arm, although she does not report specific arm pain.   SUMMARY OF HEMATOLOGIC HISTORY:  She has had chronic anemia and previously thrombocytopenia, presumed to be from chemotherapy.  She was also found to have elevated total protein which was 9.8 in October 2024 and 9.4 in December 2024.  Hence a referral was sent was for further evaluation of anemia and elevated total protein.   The patient is not currently on any medication for her breast cancer and is under observation. The patient underwent a lumpectomy for her right breast and had 18 lymph nodes removed, one of which was cancerous. The patient completed chemotherapy and radiation therapy nearly two years ago. The patient also reports having arthritis and is due for a knee replacement.   She denies fever, cough, diarrhea, or other infectious symptoms.  She denies epistaxis, bloody stool, melena, hematuria, bruising or other bleeding symptoms. She also denies unintentional weight loss, night sweats or other constitutional symptoms.   Chronic anemia with low hemoglobin levels. Hemoglobin was 11.2 in December 2024. Chemotherapy for breast cancer may have contributed. No current iron supplementation.    On her initial consultation with us  on 06/18/2023, labs revealed persistent anemia with hemoglobin of 10.2, hematocrit 32.8, MCV 94.5.  White count 3400 with normal differential, ANC of 1500.  Platelet count of 160,000.  CMP unremarkable except for total protein of 9.2 g/dL.  Creatinine was normal at 0.9, calcium normal at 9.4, albumin 4.1. Iron studies, ferritin, B12, folate, serum copper , LDH were within normal limits.   SPEP showed no evidence of M spike.  IFE showed polyclonal increase detected in one or more immunoglobulins.  Serum free kappa was elevated at 118  mg/L, serum free lambda was elevated at 33.6 mg/L, ratio elevated at 3.54.  Quantitative IgG was increased at 3368 mg/dL, IgA was also slightly increased at 375 mg/dL and IgM was also increased at 245 mg/dL.  Overall picture is not consistent with monoclonal gammopathy but rather polyclonal increase in proteins from uncertain etiology.  Normal renal function and normal calcium.    No hematological intervention is warranted at current levels.   On 09/24/2023, ANA was positive with reflex testing positive for anti-Ro and anti-La antibodies.  Rheumatoid factor was elevated at 266 IU/mL.  ESR was elevated at 123 mm/h.  CRP borderline at 1.  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  IFE showed polyclonal increase.  Both kappa and lambda were increased with ratio of 3.3, consistent with chronic disease picture.  No evidence of monoclonal gammopathy.  Referral sent to rheumatology for further evaluation of ANA positivity, positivity for anti-Ro and anti-La antibodies and elevated RF.  I have reviewed the past  medical history, past surgical history, social history and family history with the patient and they are unchanged from previous note.  ALLERGIES: She is allergic to doxycycline, amoxicillin-pot clavulanate, and lisinopril.  MEDICATIONS:  Current Outpatient Medications  Medication Sig Dispense Refill   carvedilol  (COREG ) 12.5 MG tablet Take 1 tablet (12.5 mg total) by mouth 2 (two) times daily with a meal. 180 tablet 3   ENTRESTO  24-26 MG Take 1 tablet by mouth 2 (two) times daily. 180 tablet 3   famotidine  (PEPCID ) 40 MG tablet Take 1 tablet (40 mg total) by mouth daily. 90 tablet 1   FARXIGA  10 MG TABS tablet Take 1 tablet (10 mg total) by mouth daily. 90 tablet 3   HYDROcodone -acetaminophen  (NORCO/VICODIN) 5-325 MG tablet Take 1 tablet by mouth every 8 (eight) hours as needed for moderate pain (pain score 4-6). 90 tablet 0   spironolactone   (ALDACTONE ) 25 MG tablet Take 1 tablet (25 mg total) by mouth daily. 90 tablet 3   No current facility-administered medications for this visit.     REVIEW OF SYSTEMS:    Review of Systems - Oncology  All other pertinent systems were reviewed with the patient and are negative.  PHYSICAL EXAMINATION:    Onc Performance Status - 03/09/24 0934       ECOG Perf Status   ECOG Perf Status Restricted in physically strenuous activity but ambulatory and able to carry out work of a light or sedentary nature, e.g., light house work, office work      KPS SCALE   KPS % SCORE Normal activity with effort, some s/s of disease            Vitals:   03/09/24 0929 03/09/24 0938  BP: 135/69 (!) 153/78  Pulse:  99  Resp:  17  Temp:  97.6 F (36.4 C)  SpO2:  98%    Filed Weights   03/09/24 0928 03/09/24 0938  Weight: 197 lb 9.6 oz (89.6 kg) 197 lb 9.6 oz (89.6 kg)     Physical Exam Constitutional:      General: She is not in acute distress.    Appearance: Normal appearance.  HENT:     Head: Normocephalic and atraumatic.  Eyes:     General: No scleral icterus.    Conjunctiva/sclera: Conjunctivae normal.  Cardiovascular:     Rate and Rhythm: Normal rate and regular rhythm.     Heart sounds: Normal heart sounds.  Pulmonary:     Effort: Pulmonary effort is normal.     Breath sounds: Normal breath sounds.  Chest:    Abdominal:     General: There is no distension.  Musculoskeletal:     Right lower leg: No edema.     Left lower leg: No edema.  Lymphadenopathy:     Cervical: No cervical adenopathy.  Neurological:     General: No focal deficit present.     Mental Status: She is alert and oriented to person, place, and time.  Psychiatric:        Mood and Affect: Mood normal.        Behavior: Behavior normal.        Thought Content: Thought content normal.     LABORATORY DATA:   I have reviewed the data as listed.  Results for orders placed or performed in visit on  03/09/24  Iron and Iron Binding Capacity ([facility] only)  Result Value Ref Range   Iron 73 28 - 170 ug/dL   TIBC 709 749 -  450 ug/dL   Saturation Ratios 25 10.4 - 31.8 %   UIBC 217 148 - 442 ug/dL  Ferritin  Result Value Ref Range   Ferritin 345 (H) 11 - 307 ng/mL  CMP (Cancer Center only)  Result Value Ref Range   Sodium 137 135 - 145 mmol/L   Potassium 4.1 3.5 - 5.1 mmol/L   Chloride 107 98 - 111 mmol/L   CO2 26 22 - 32 mmol/L   Glucose, Bld 124 (H) 70 - 99 mg/dL   BUN 14 8 - 23 mg/dL   Creatinine 9.19 9.55 - 1.00 mg/dL   Calcium 8.9 8.9 - 89.6 mg/dL   Total Protein 8.9 (H) 6.5 - 8.1 g/dL   Albumin 3.8 3.5 - 5.0 g/dL   AST 17 15 - 41 U/L   ALT 11 0 - 44 U/L   Alkaline Phosphatase 81 38 - 126 U/L   Total Bilirubin 0.4 0.0 - 1.2 mg/dL   GFR, Estimated >39 >39 mL/min   Anion gap 4 (L) 5 - 15  CBC with Differential (Cancer Center Only)  Result Value Ref Range   WBC Count 3.7 (L) 4.0 - 10.5 K/uL   RBC 2.81 (L) 3.87 - 5.11 MIL/uL   Hemoglobin 8.3 (L) 12.0 - 15.0 g/dL   HCT 74.1 (L) 63.9 - 53.9 %   MCV 91.8 80.0 - 100.0 fL   MCH 29.5 26.0 - 34.0 pg   MCHC 32.2 30.0 - 36.0 g/dL   RDW 81.2 (H) 88.4 - 84.4 %   Platelet Count 164 150 - 400 K/uL   nRBC 0.5 (H) 0.0 - 0.2 %   Neutrophils Relative % 50 %   Neutro Abs 1.9 1.7 - 7.7 K/uL   Lymphocytes Relative 38 %   Lymphs Abs 1.4 0.7 - 4.0 K/uL   Monocytes Relative 5 %   Monocytes Absolute 0.2 0.1 - 1.0 K/uL   Eosinophils Relative 1 %   Eosinophils Absolute 0.0 0.0 - 0.5 K/uL   Basophils Relative 0 %   Basophils Absolute 0.0 0.0 - 0.1 K/uL   WBC Morphology See Note    Smear Review See Note    Immature Granulocytes 6 %   Abs Immature Granulocytes 0.20 (H) 0.00 - 0.07 K/uL   Tear Drop Cells PRESENT    Ovalocytes PRESENT       RADIOGRAPHIC STUDIES:  No recent pertinent imaging studies available to review.  Orders Placed This Encounter  Procedures   MM DIAG BREAST TOMO UNI RIGHT    [facility] PF;  06/10/2023@[facility]  Yes  issues(new lump) yes hx of br ca-    Standing Status:   Future    Expected Date:   03/10/2024    Expiration Date:   03/09/2025    Reason for Exam (SYMPTOM  OR DIAGNOSIS REQUIRED):   Hx of right sided breast cancer diagnosed in 2021, now with palpable lump in right upper central area. Please evaluate    Preferred imaging location?:   [facility]   US  LIMITED ULTRASOUND INCLUDING AXILLA RIGHT BREAST    Standing Status:   Future    Expected Date:   03/10/2024    Expiration Date:   03/09/2025    Reason for Exam (SYMPTOM  OR DIAGNOSIS REQUIRED):   Hx of right sided breast cancer diagnosed in 2021, now with palpable lump in right upper central area. Please evaluate    Preferred imaging location?:   [facility]   Ambulatory referral to Rheumatology    Referral Priority:  Routine    Referral Type:   Consultation    Referral Reason:   Specialty Services Required    Requested Specialty:   Rheumatology    Number of Visits Requested:   1     Future Appointments  Date Time Provider Department Center  03/24/2024  3:45 PM Autumn Millman, MD [facility] None  04/17/2024  3:00 PM Urbano Albright, MD [facility] CPR  05/24/2024  2:45 PM [facility] LAB [facility] None  05/24/2024  3:15 PM Daeshon Grammatico, Millman, MD [facility] None     This document was completed utilizing speech recognition software. Grammatical errors, random word insertions, pronoun errors, and incomplete sentences are an occasional consequence of this system due to software limitations, ambient noise, and hardware issues. Any formal questions or concerns about the content, text or information contained within the body of this dictation should be directly addressed to the provider for clarification.

## 2024-03-09 NOTE — Telephone Encounter (Signed)
 You can schedule her for an office visit. If she does not want to wait she can be seen on mobile unit or urgent care

## 2024-03-14 ENCOUNTER — Other Ambulatory Visit: Payer: Self-pay | Admitting: Oncology

## 2024-03-14 ENCOUNTER — Encounter: Payer: Self-pay | Admitting: Oncology

## 2024-03-14 ENCOUNTER — Telehealth: Payer: Self-pay

## 2024-03-14 ENCOUNTER — Other Ambulatory Visit: Payer: Self-pay

## 2024-03-14 NOTE — Telephone Encounter (Signed)
 Pt's daughter called stating that when the pt was last seen in clinic by Dr. Autumn, he was going to send a referral for the pt to have a mammogram.  Pt's daughter stated they have not heard from anyone regarding the mammogram.  Stated the referral was placed for [facility] and provided the pt's daughter with the telephone number to get the mammogram scheduled.  Pt's daughter was thankful and stated she would call them now to schedule the mammogram.

## 2024-03-15 ENCOUNTER — Inpatient Hospital Stay: Admission: RE | Admit: 2024-03-15 | Discharge: 2024-03-15 | Attending: Oncology

## 2024-03-15 ENCOUNTER — Other Ambulatory Visit: Payer: Self-pay | Admitting: Oncology

## 2024-03-15 DIAGNOSIS — R928 Other abnormal and inconclusive findings on diagnostic imaging of breast: Secondary | ICD-10-CM | POA: Diagnosis not present

## 2024-03-15 DIAGNOSIS — N6489 Other specified disorders of breast: Secondary | ICD-10-CM | POA: Diagnosis not present

## 2024-03-15 DIAGNOSIS — C50911 Malignant neoplasm of unspecified site of right female breast: Secondary | ICD-10-CM

## 2024-03-15 DIAGNOSIS — T888XXD Other specified complications of surgical and medical care, not elsewhere classified, subsequent encounter: Secondary | ICD-10-CM

## 2024-03-17 ENCOUNTER — Ambulatory Visit
Admission: RE | Admit: 2024-03-17 | Discharge: 2024-03-17 | Disposition: A | Discharge status: Home / Self Care | Source: Ambulatory Visit | Attending: Oncology | Admitting: Oncology

## 2024-03-17 ENCOUNTER — Other Ambulatory Visit: Payer: Self-pay | Admitting: Oncology

## 2024-03-17 DIAGNOSIS — C50911 Malignant neoplasm of unspecified site of right female breast: Secondary | ICD-10-CM

## 2024-03-17 DIAGNOSIS — T888XXD Other specified complications of surgical and medical care, not elsewhere classified, subsequent encounter: Secondary | ICD-10-CM

## 2024-03-22 LAB — AEROBIC/ANAEROBIC CULTURE W GRAM STAIN (SURGICAL/DEEP WOUND)
Culture: NO GROWTH
Special Requests: NORMAL

## 2024-03-24 ENCOUNTER — Inpatient Hospital Stay: Attending: Oncology | Admitting: Oncology

## 2024-03-24 DIAGNOSIS — D649 Anemia, unspecified: Secondary | ICD-10-CM

## 2024-03-24 NOTE — Progress Notes (Signed)
 [facility]  HEMATOLOGY-ONCOLOGY ELECTRONIC VISIT PROGRESS NOTE  PATIENT NAME: Marie Church   MR#: [id number] DOB: Jan 05, 1956  DATE OF SERVICE: 03/24/2024  Patient Care Team: Theotis Haze ORN, NP as PCP - General (Nurse Practitioner) Francyne Headland, MD as PCP - Cardiology (Cardiology)  I connected with the patient via telephone conference and verified that I am speaking with the correct person using two identifiers. The patient's location is at home and I am providing care from the [facility].  I discussed the limitations, risks, security and privacy concerns of performing an evaluation and management service by e-visits and the availability of in person appointments. I also discussed with the patient that there may be a patient responsible charge related to this service. The patient expressed understanding and agreed to proceed.   ASSESSMENT & PLAN:   Marie Church is a 68 y.o. lady with a past medical history of stage II B right-sided breast cancer, ER negative, PR negative, HER2/neu positive, diagnosed in November 2021 and treated at [place], [place], hypertension, diabetes mellitus type 2, chronic thrombocytopenia, CHF from chemotherapy, which resolved, was referred to our service in January 2025 for evaluation of anemia and elevated total protein.   Breast cancer metastasized to axillary lymph node, right [facility]) Diagnosed in November 2021 and managed at [place], [place].  ER negative, PR negative, HER2 positive.  Originally plan was to treat her with [facility] regimen every 3 weeks for 6 cycles as part of neoadjuvant treatment.     C1 HP only due to poor PS. 05/16/20 added weekly Taxol. (THP)  She had Herceptin 5/13 and then was admitted with CHF [facility]) prior to her next treatment   She ended up having lumpectomy 11/2020 - 6 foci of IDC, largest 4mm but did have LVI and 2/18 nodes  12/2020 re-excision of positive margin   Completed 5 cycles Kadcyla  06/20/2021 - stopped due to low PLT   HER2 positive status has a slightly higher recurrence risk  Reports right breast pain and a palpable hard area, distinct from usual post-surgical puffiness, present for two weeks. Located above the scar tissue from previous breast cancer surgery in November 2021.   Last mammogram in January 2025 was normal.   Since she had new symptoms in the right breast with swelling, pain and puffiness, we obtained diagnostic mammogram and ultrasound on 03/15/2024.  It showed nonspecific area at 12 o'clock position of right breast, measuring 1.8 cm x 1.2 cm.  Ultrasound-guided aspiration of fluid collection was obtained.  BI-RADS 3, probably benign.  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  She was prescribed Bactrim  but patient developed severe fatigue and generalized malaise after starting Bactrim .  She was advised to hold that.  She has ultrasound of the axilla scheduled on 03/27/2024 for further evaluation.  Further recommendations will follow after that.  Adverse reaction to antibiotic therapy She reports feeling unwell since starting sulfamethoxazole  (Bactrim ), suggesting a possible adverse reaction. The decision was made to discontinue the antibiotic to assess symptom improvement. - Discontinued sulfamethoxazole  (Bactrim )  I discussed the assessment and treatment plan with the patient. The patient was provided an opportunity to ask questions and all were answered. The patient agreed with the plan and demonstrated an understanding of the instructions. The patient was advised to call back or seek an in-person evaluation if the symptoms worsen or if the condition fails to improve as anticipated.    I spent 15 minutes over the phone with the patient  reviewing test results, discuss management and coordination/planning of care.  Chinita Patten, MD 03/24/2024 4:07 PM [facility] - A DEPT OF [facility] [address] [place] [address] Dept: [phone] Dept Fax: [phone]   INTERVAL HISTORY:  Please see above for problem oriented charting.  The purpose of today's discussion is to explain recent lab results and to formulate plan of care.  Discussed the use of AI scribe software for clinical note transcription with the patient, who gave verbal consent to proceed.  History of Present Illness Marie Church is a 68 year old female who presents with illness following a breast biopsy and antibiotic treatment.  She underwent an ultrasound and mammogram, which led to a biopsy due to a detected 'knot' in the breast. No bacterial growth was detected from the abscess, and a follow-up ultrasound is scheduled for Monday, March 27, 2024.  Following the biopsy, she was prescribed sulfamethoxazole  (Bactrim ) to be taken for ten days. She has been feeling unwell since starting the antibiotics, with six days remaining in the course. She experiences significant discomfort and illness since the initiation of the antibiotic treatment, which she associates with her current symptoms.    SUMMARY OF HEMATOLOGY HISTORY:  She has had chronic anemia and previously thrombocytopenia, presumed to be from chemotherapy.  She was also found to have elevated total protein which was 9.8 in October 2024 and 9.4 in December 2024.  Hence a referral was sent was for further evaluation of anemia and elevated total protein.   The patient is not currently on any medication for her breast cancer and is under observation. The patient underwent a lumpectomy for her right breast and had 18 lymph nodes removed, one of which was cancerous. The patient completed chemotherapy and radiation therapy nearly two years ago. The patient also reports having arthritis and is due for a knee replacement.   She denies fever, cough, diarrhea, or other infectious symptoms.  She denies epistaxis, bloody stool, melena,  hematuria, bruising or other bleeding symptoms. She also denies unintentional weight loss, night sweats or other constitutional symptoms.   Chronic anemia with low hemoglobin levels. Hemoglobin was 11.2 in December 2024. Chemotherapy for breast cancer may have contributed. No current iron supplementation.    On her initial consultation with us  on 06/18/2023, labs revealed persistent anemia with hemoglobin of 10.2, hematocrit 32.8, MCV 94.5.  White count 3400 with normal differential, ANC of 1500.  Platelet count of 160,000.  CMP unremarkable except for total protein of 9.2 g/dL.  Creatinine was normal at 0.9, calcium normal at 9.4, albumin 4.1. Iron studies, ferritin, B12, folate, serum copper , LDH were within normal limits.   SPEP showed no evidence of M spike.  IFE showed polyclonal increase detected in one or more immunoglobulins.  Serum free kappa was elevated at 118 mg/L, serum free lambda was elevated at 33.6 mg/L, ratio elevated at 3.54.  Quantitative IgG was increased at 3368 mg/dL, IgA was also slightly increased at 375 mg/dL and IgM was also increased at 245 mg/dL.  Overall picture is not consistent with monoclonal gammopathy but rather polyclonal increase in proteins from uncertain etiology.  Normal renal function and normal calcium.    No hematological intervention is warranted at current levels.   On 09/24/2023, ANA was positive with reflex testing positive for anti-Ro and anti-La antibodies.  Rheumatoid factor was elevated at 266 IU/mL.  ESR was elevated at 123 mm/h.  CRP borderline at 1.  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  IFE showed polyclonal increase.  Both kappa and lambda were increased with ratio of 3.3, consistent with chronic disease picture.  No evidence of monoclonal gammopathy.   Referral sent to rheumatology for further evaluation of ANA positivity, positivity for anti-Ro and anti-La antibodies and elevated  RF.  Oncology History  Breast cancer metastasized to axillary lymph node, right (HCC)  11/30/2020 Initial Diagnosis   Breast cancer metastasized to axillary lymph node, right (HCC)   06/18/2023 Cancer Staging   Staging form: Breast, AJCC 8th Edition - Clinical: Stage IIB (cT2, cN1, cM0, G2, ER-, PR-, HER2+) - Signed by Autumn Millman, MD on 06/18/2023 Method of lymph node assessment: Axillary lymph node dissection Histologic grading system: 3 grade system     REVIEW OF SYSTEMS:    Review of Systems - Oncology  All other pertinent systems were reviewed with the patient and are negative.  I have reviewed the past medical history, past surgical history, social history and family history with the patient and they are unchanged from previous note.  ALLERGIES:  She is allergic to doxycycline, amoxicillin-pot clavulanate, and lisinopril.  MEDICATIONS:  Current Outpatient Medications  Medication Sig Dispense Refill   carvedilol  (COREG ) 12.5 MG tablet Take 1 tablet (12.5 mg total) by mouth 2 (two) times daily with a meal. 180 tablet 3   ENTRESTO  24-26 MG Take 1 tablet by mouth 2 (two) times daily. 180 tablet 3   famotidine  (PEPCID ) 40 MG tablet Take 1 tablet (40 mg total) by mouth daily. 90 tablet 1   FARXIGA  10 MG TABS tablet Take 1 tablet (10 mg total) by mouth daily. 90 tablet 3   HYDROcodone -acetaminophen  (NORCO/VICODIN) 5-325 MG tablet Take 1 tablet by mouth every 8 (eight) hours as needed for moderate pain (pain score 4-6). 90 tablet 0   spironolactone  (ALDACTONE ) 25 MG tablet Take 1 tablet (25 mg total) by mouth daily. 90 tablet 3   No current facility-administered medications for this visit.    PHYSICAL EXAMINATION:  Not performed today as it was a phone only visit  LABORATORY DATA:   I have reviewed the data as listed.  Recent Results (from the past 2160 hours)  Methylmalonic acid, serum     Status: None   Collection Time: 01/19/24  3:01 PM  Result Value Ref Range    Methylmalonic Acid, Quantitative 169 0 - 378 nmol/L    Comment: (NOTE) This test was developed and its performance characteristics determined by [facility]. It has not been cleared or approved by the Food and Drug Administration. Performed At: [facility] [address] [place], [place] [id number] Jennette Shorter MD Ey:[id number]   Epstein-Barr virus VCA, IgG     Status: Abnormal   Collection Time: 01/19/24  3:01 PM  Result Value Ref Range   EBV VCA IgG >600.0 (H) 0.0 - 17.9 U/mL    Comment: (NOTE)                                 Negative        <18.0                                 Equivocal 18.0 - 21.9  Positive        >21.9 Performed At: [facility] [address] [place], [place] [id number] Jennette Shorter MD Ey:[id number]   HIV antibody (with reflex)     Status: None   Collection Time: 01/19/24  3:01 PM  Result Value Ref Range   HIV Screen 4th Generation wRfx Non Reactive Non Reactive    Comment: Performed at [facility], [address], [place], [place] [address]  Hepatitis panel, acute     Status: None   Collection Time: 01/19/24  3:01 PM  Result Value Ref Range   Hepatitis B Surface Ag NON REACTIVE NON REACTIVE   HCV Ab NON REACTIVE NON REACTIVE    Comment: (NOTE) Nonreactive HCV antibody screen is consistent with no HCV infections,  unless recent infection is suspected or other evidence exists to indicate HCV infection.     Hep A IgM NON REACTIVE NON REACTIVE   Hep B C IgM NON REACTIVE NON REACTIVE    Comment: Performed at [facility], [address], [place], [place] [address]  Reticulocytes     Status: Abnormal   Collection Time: 01/19/24  3:01 PM  Result Value Ref Range   Retic Ct Pct 1.0 0.4 - 3.1 %   RBC. 3.16 (L) 3.87 - 5.11 MIL/uL   Retic Count, Absolute 31.6 19.0 - 186.0 K/uL   Immature Retic Fract 17.8 (H) 2.3 - 15.9 %    Comment: Performed at [facility], [address], [place], [place] [address]  CMP (Cancer Center only)     Status: Abnormal   Collection Time: 01/19/24  3:01 PM  Result Value Ref Range   Sodium 134 (L) 135 - 145 mmol/L   Potassium 4.7 3.5 - 5.1 mmol/L   Chloride 100 98 - 111 mmol/L   CO2 30 22 - 32 mmol/L   Glucose, Bld 95 70 - 99 mg/dL    Comment: Glucose reference range applies only to samples taken after fasting for at least 8 hours.   BUN 18 8 - 23 mg/dL   Creatinine 9.05 9.55 - 1.00 mg/dL   Calcium 9.4 8.9 - 89.6 mg/dL   Total Protein 9.9 (H) 6.5 - 8.1 g/dL   Albumin 3.9 3.5 - 5.0 g/dL   AST 24 15 - 41 U/L   ALT 14 0 - 44 U/L   Alkaline Phosphatase 67 38 - 126 U/L   Total Bilirubin 0.4 0.0 - 1.2 mg/dL   GFR, Estimated >39 >39 mL/min    Comment: (NOTE) Calculated using the CKD-EPI Creatinine Equation (2021)    Anion gap 4 (L) 5 - 15    Comment: Performed at [facility], [address] [place], [place] [address]  CBC with Differential (Cancer Center Only)     Status: Abnormal   Collection Time: 01/19/24  3:01 PM  Result Value Ref Range   WBC Count 3.5 (L) 4.0 - 10.5 K/uL   RBC 3.17 (L) 3.87 - 5.11 MIL/uL   Hemoglobin 9.3 (L) 12.0 - 15.0 g/dL   HCT 70.7 (L) 63.9 - 53.9 %   MCV 92.1 80.0 - 100.0 fL   MCH 29.3 26.0 - 34.0 pg   MCHC 31.8 30.0 - 36.0 g/dL   RDW 82.7 (H) 88.4 - 84.4 %   Platelet Count 157 150 - 400 K/uL   nRBC 0.0 0.0 - 0.2 %   Neutrophils Relative % 37 %   Neutro Abs 1.3 (L) 1.7 -  7.7 K/uL   Lymphocytes Relative 49 %   Lymphs Abs 1.7 0.7 - 4.0 K/uL   Monocytes Relative 7 %   Monocytes Absolute 0.3 0.1 - 1.0 K/uL   Eosinophils Relative 1 %   Eosinophils Absolute 0.0 0.0 - 0.5 K/uL   Basophils Relative 0 %   Basophils Absolute 0.0 0.0 - 0.1 K/uL   WBC Morphology See Note     Comment: Mild Left Shift (1-5% metas, occ myelo)   Smear Review See Note     Comment: Large Platelets Present   Immature Granulocytes 6 %   Abs Immature Granulocytes 0.21 (H) 0.00 - 0.07 K/uL    Tear Drop Cells PRESENT     Comment: Performed at [facility], [address], [place], [place] [address]  Drug Tox Alc Metab w/Con, Oral Fld     Status: None   Collection Time: 02/14/24  4:02 PM  Result Value Ref Range   Alcohol Metabolite NEGATIVE <25 ng/mL    Comment: . For additional information, please refer to http://education.questdiagnostics.com/faq/FAQ183 (This link is being provided for informational/ educational purposes only.) . This drug testing is for medical treatment only. Analysis was performed as non-forensic testing and these results should be used only by healthcare providers to render diagnosis or treatment, or to monitor progress of medical conditions. . For assistance with interpreting these drug results, please contact a [employer] Toxicology Specialist: [phone] TOX [phone]), M-F, 8am-6pm EST. SABRA These tests were developed and their analytical performance characteristics have been determined by [facility]. They have not been cleared or approved by the FDA. These assays have been validated pursuant to the CLIA regulations and are used for clinical purposes.   Drug Tox Monitor 1 w/Conf, Oral Fld     Status: Abnormal   Collection Time: 02/14/24  4:02 PM  Result Value Ref Range   Amphetamines NEGATIVE <10 ng/mL   Barbiturates NEGATIVE <10 ng/mL   Benzodiazepines NEGATIVE <0.50 ng/mL   Buprenorphine NEGATIVE <0.10 ng/mL   Cocaine NEGATIVE <5.0 ng/mL   Fentanyl  NEGATIVE <0.10 ng/mL   Heroin Metabolite NEGATIVE <1.0 ng/mL   MARIJUANA NEGATIVE <2.5 ng/mL   MDMA NEGATIVE <10 ng/mL   Meprobamate NEGATIVE <2.5 ng/mL   Methadone NEGATIVE <5.0 ng/mL   Nicotine Metabolite NEGATIVE <5.0 ng/mL   Opiates POSITIVE (A) <2.5 ng/mL   Codeine Negative <2.5 ng/mL   Dihydrocodeine 5.8 (H) <2.5 ng/mL    Comment: . Dihydrocodeine is a minor metabolite of hydrocodone  as well as a prescribed drug.    Hydrocodone   18.6 (H) <2.5 ng/mL    Comment: . Hydrocodone  is a minor metabolite of codeine as well as prescribed drug. Hydrocodone  is a pharmaceutical impurity of oxycodone  (up to 1%). SABRA    Hydromorphone Negative <2.5 ng/mL   Morphine Negative <2.5 ng/mL   Norhydrocodone Negative <2.5 ng/mL   Noroxycodone Negative <2.5 ng/mL   Oxycodone  Negative <2.5 ng/mL   Oxymorphone Negative <2.5 ng/mL   Phencyclidine NEGATIVE <10 ng/mL   Tapentadol NEGATIVE <5.0 ng/mL   Tramadol  NEGATIVE <5.0 ng/mL   Zolpidem NEGATIVE <5.0 ng/mL    Comment: . For additional information, please refer to http://education.[facility].com/faq/FAQ186 (This link is being provided for informational/ educational purposes only.) . This drug testing is for medical treatment only. Analysis was performed as non-forensic testing and these results should be used only by healthcare providers to render diagnosis or treatment, or to monitor progress of medical conditions. . For assistance with interpreting these drug results, please contact  a Development Worker, International Aid Specialist: [phone] TOX [phone]), M-F, 8am-6pm EST. SABRA These tests were developed and their analytical performance characteristics have been determined by [facility]. They have not been cleared or approved by the FDA. These assays have been validated pursuant to the CLIA regulations and are used for clinical purposes.   Iron and Iron Binding Capacity ([facility] only)     Status: None   Collection Time: 03/09/24  9:09 AM  Result Value Ref Range   Iron 73 28 - 170 ug/dL   TIBC 709 749 - 549 ug/dL   Saturation Ratios 25 10.4 - 31.8 %   UIBC 217 148 - 442 ug/dL    Comment: Performed at [facility], [address], [place], [place] [address]  Ferritin     Status: Abnormal   Collection Time: 03/09/24  9:09 AM  Result Value Ref Range   Ferritin 345 (H) 11 - 307 ng/mL    Comment: Performed at [facility], [address] [place], [place] [address]  CMP (Cancer Center only)     Status: Abnormal   Collection Time: 03/09/24  9:09 AM  Result Value Ref Range   Sodium 137 135 - 145 mmol/L   Potassium 4.1 3.5 - 5.1 mmol/L   Chloride 107 98 - 111 mmol/L   CO2 26 22 - 32 mmol/L   Glucose, Bld 124 (H) 70 - 99 mg/dL    Comment: Glucose reference range applies only to samples taken after fasting for at least 8 hours.   BUN 14 8 - 23 mg/dL   Creatinine 9.19 9.55 - 1.00 mg/dL   Calcium 8.9 8.9 - 89.6 mg/dL   Total Protein 8.9 (H) 6.5 - 8.1 g/dL   Albumin 3.8 3.5 - 5.0 g/dL   AST 17 15 - 41 U/L   ALT 11 0 - 44 U/L   Alkaline Phosphatase 81 38 - 126 U/L   Total Bilirubin 0.4 0.0 - 1.2 mg/dL   GFR, Estimated >39 >39 mL/min    Comment: (NOTE) Calculated using the CKD-EPI Creatinine Equation (2021)    Anion gap 4 (L) 5 - 15    Comment: Performed at [facility], [address] [place], [place] [address]  CBC with Differential (Cancer Center Only)     Status: Abnormal   Collection Time: 03/09/24  9:09 AM  Result Value Ref Range   WBC Count 3.7 (L) 4.0 - 10.5 K/uL   RBC 2.81 (L) 3.87 - 5.11 MIL/uL   Hemoglobin 8.3 (L) 12.0 - 15.0 g/dL   HCT 74.1 (L) 63.9 - 53.9 %   MCV 91.8 80.0 - 100.0 fL   MCH 29.5 26.0 - 34.0 pg   MCHC 32.2 30.0 - 36.0 g/dL   RDW 81.2 (H) 88.4 - 84.4 %   Platelet Count 164 150 - 400 K/uL   nRBC 0.5 (H) 0.0 - 0.2 %   Neutrophils Relative % 50 %   Neutro Abs 1.9 1.7 - 7.7 K/uL   Lymphocytes Relative 38 %   Lymphs Abs 1.4 0.7 - 4.0 K/uL   Monocytes Relative 5 %   Monocytes Absolute 0.2 0.1 - 1.0 K/uL   Eosinophils Relative 1 %   Eosinophils Absolute 0.0 0.0 - 0.5 K/uL   Basophils Relative 0 %   Basophils Absolute 0.0 0.0 - 0.1 K/uL   WBC Morphology See Note     Comment: Mild Left Shift (1-5% metas, occ myelo)   Smear Review  See Note     Comment: Large Platelets Present   Immature Granulocytes 6 %   Abs Immature Granulocytes 0.20 (H)  0.00 - 0.07 K/uL   Tear Drop Cells PRESENT    Ovalocytes PRESENT     Comment: Performed at [facility], [address], [place], [place] [address]  Aerobic/Anaerobic Culture w Gram Stain (surgical/deep wound)     Status: None   Collection Time: 03/17/24  3:16 PM   Specimen: Abscess  Result Value Ref Range   Specimen Description ABSCESS RIGHT BREAST    Special Requests Normal    Gram Stain      FEW WBC PRESENT, PREDOMINANTLY PMN NO ORGANISMS SEEN    Culture      No growth aerobically or anaerobically. Performed at [facility], [address], [place], [place] [address]    Report Status 03/22/2024 FINAL      RADIOGRAPHIC STUDIES:  I have personally reviewed the radiological images as listed and agree with the findings in the report.  US  BREAST ASPIRATION RIGHT Addendum Date: 03/23/2024 ADDENDUM REPORT: 03/23/2024 07:30 ADDENDUM: ASPIRATION Right breast abscess revealed: Gram Stain: FEW WBC PRESENT, PREDOMINANTLY PMN NO ORGANISMS SEEN. Culture: No growth aerobically or anaerobically. Aspiration results are concordant with findings per Dr. Alm Parkins. Mliss Molt RN spoke with patient via telephone to assess aspiration site, which was found to be within normal limits on 03/20/2024. Questions were answered and patient was instructed to call [facility] for any additional questions or concerns related to the aspiration site. Recommendation: 1. Follow-up right breast ultrasound in 7-10 days to reassess for resolution/improvement of abscess. Ultrasound is scheduled for 03/27/24; patient is aware. Pathology results reported by Mliss Molt RN 03/22/2024. Electronically Signed   By: Alm Parkins M.D.   On: 03/23/2024 07:30   Result Date: 03/23/2024 CLINICAL DATA:  Patient presents for aspiration of a right breast collection. This collection appears new mammographically, superficial to 12/2019 lumpectomy site. There is associated  pain/tenderness. Patient reports the discomfort has mildly decreased since the diagnostic exam performed 2 days previously. EXAM: ULTRASOUND GUIDED RIGHT BREAST CYST ASPIRATION COMPARISON:  Previous exam(s). PROCEDURE: The patient and I discussed the procedure of ultrasound-guided aspiration including benefits and alternatives. We discussed the high likelihood of a successful procedure. We discussed the risks of the procedure including infection, bleeding, tissue injury, and inadequate sampling. Informed written consent was given. The usual time out protocol was performed immediately prior to the procedure. Using sterile technique, 1% lidocaine , under direct ultrasound visualization, needle aspiration of the collections superficial to the lumpectomy bed in the right breast was performed. 3 mL of purulent fluid was recovered and will be sent to the laboratory for analysis. IMPRESSION: Ultrasound-guided aspiration of a right breast collection no apparent complications. RECOMMENDATIONS: 1. Follow-up right breast ultrasound and 7-10 days to reassess for resolution/improvement. Electronically Signed: By: Alm Parkins M.D. On: 03/17/2024 15:19   MM DIAG BREAST TOMO UNI RIGHT Result Date: 03/15/2024 CLINICAL DATA:  Patient presents with a tender palpable lump above her lumpectomy scar in the upper outer right breast. She has a history of stage II B right-sided breast carcinoma diagnosed in November 2021 treated in [employer] [place] . EXAM: DIGITAL DIAGNOSTIC UNILATERAL RIGHT MAMMOGRAM WITH TOMOSYNTHESIS AND CAD; ULTRASOUND RIGHT BREAST LIMITED TECHNIQUE: Right digital diagnostic mammography and breast tomosynthesis was performed. The images were evaluated with computer-aided detection. ; Targeted ultrasound examination of the right breast was performed COMPARISON:  Previous exam(s).  ACR Breast Density Category b: There are scattered areas of fibroglandular density. FINDINGS: In the upper right breast,  superficial to the lumpectomy bed, there is an oval mass measuring 1.3 cm in size. This is not visualized on the exam from 06/10/2023. Underlying lumpectomy bed and associated scarring is similar to the prior studies. On physical exam, patient has a tender lump in the upper right breast at 12 o'clock. Targeted ultrasound is performed, showing a fluid collection in the right breast at 12 o'clock, 5 cm the nipple, measuring 1.8 x 0.8 x 1.3 cm. There is relative increased echogenicity of the surrounding tissue suggesting inflammation. Wall of the fluid collection is mildly irregular, and there is a small polypoid area versus dependent debris along its posterior wall measuring 3 mm in size. IMPRESSION: 1. Probably benign fluid collection in the right breast at 12 o'clock measuring 1.8 cm in greatest dimension. The etiology of this collection is unclear, since it appears new both clinically and mammographically since the patient's exam dated 06/10/2023. It is associated with tenderness consistent with inflammation. RECOMMENDATION: 1. Ultrasound-guided needle aspiration of the 1.8 cm fluid collection in the right breast at 12 o'clock. I have discussed the findings and recommendations with the patient. If applicable, a reminder letter will be sent to the patient regarding the next appointment. BI-RADS CATEGORY  3: Probably benign. Electronically Signed   By: Alm Parkins M.D.   On: 03/15/2024 14:19   US  LIMITED ULTRASOUND INCLUDING AXILLA RIGHT BREAST Result Date: 03/15/2024 CLINICAL DATA:  Patient presents with a tender palpable lump above her lumpectomy scar in the upper outer right breast. 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 EXAM: DIGITAL DIAGNOSTIC UNILATERAL RIGHT MAMMOGRAM WITH TOMOSYNTHESIS AND CAD; ULTRASOUND RIGHT BREAST LIMITED TECHNIQUE: Right digital diagnostic mammography and breast tomosynthesis was performed. The  images were evaluated with computer-aided detection. ; Targeted ultrasound examination of the right breast was performed COMPARISON:  Previous exam(s). ACR Breast Density Category b: There are scattered areas of fibroglandular density. FINDINGS: In the upper right breast, superficial to the lumpectomy bed, there is an oval mass measuring 1.3 cm in size. This is not visualized on the exam from 06/10/2023. Underlying lumpectomy bed and associated scarring is similar to the prior studies. On physical exam, patient has a tender lump in the upper right breast at 12 o'clock. Targeted ultrasound is performed, showing a fluid collection in the right breast at 12 o'clock, 5 cm the nipple, measuring 1.8 x 0.8 x 1.3 cm. There is relative increased echogenicity of the surrounding tissue suggesting inflammation. Wall of the fluid collection is mildly irregular, and there is a small polypoid area versus dependent debris along its posterior wall measuring 3 mm in size. IMPRESSION: 1. Probably benign fluid collection in the right breast at 12 o'clock measuring 1.8 cm in greatest dimension. The etiology of this collection is unclear, since it appears new both clinically and mammographically since the patient's exam dated 06/10/2023. It is associated with tenderness consistent with inflammation. RECOMMENDATION: 1. Ultrasound-guided needle aspiration of the 1.8 cm fluid collection in the right breast at 12 o'clock. I have discussed the findings and recommendations with the patient. If applicable, a reminder letter will be sent to the patient regarding the next appointment. BI-RADS CATEGORY  3: Probably benign. Electronically Signed   By: Alm Parkins M.D.   On: 03/15/2024 14:19    No orders of the defined types were placed  in this encounter.    Future Appointments  Date Time Provider Department Center  03/27/2024  2:45 PM [facility] PROCEDURES 1 [facility] GI-BREAST CE  03/27/2024  2:55 PM [facility] PROCEDURES 1 [facility] GI-BREAST CE   04/17/2024  3:00 PM Urbano Albright, MD [facility] CPR  05/24/2024  2:45 PM [facility] None  05/24/2024  3:15 PM Aldea Avis, [place], MD [facility] None    This document was completed utilizing speech recognition software. Grammatical errors, random word insertions, pronoun errors, and incomplete sentences are an occasional consequence of this system due to software limitations, ambient noise, and hardware issues. Any formal questions or concerns about the content, text or information contained within the body of this dictation should be directly addressed to the provider for clarification.

## 2024-03-26 ENCOUNTER — Other Ambulatory Visit: Payer: Self-pay

## 2024-03-26 ENCOUNTER — Emergency Department (HOSPITAL_COMMUNITY)

## 2024-03-26 ENCOUNTER — Emergency Department (HOSPITAL_COMMUNITY)
Admission: EM | Admit: 2024-03-26 | Discharge: 2024-03-26 | Disposition: A | Discharge status: Home / Self Care | Attending: Emergency Medicine | Admitting: Emergency Medicine

## 2024-03-26 DIAGNOSIS — R531 Weakness: Secondary | ICD-10-CM | POA: Diagnosis not present

## 2024-03-26 DIAGNOSIS — E119 Type 2 diabetes mellitus without complications: Secondary | ICD-10-CM | POA: Insufficient documentation

## 2024-03-26 DIAGNOSIS — I509 Heart failure, unspecified: Secondary | ICD-10-CM | POA: Diagnosis not present

## 2024-03-26 DIAGNOSIS — I110 Hypertensive heart disease with heart failure: Secondary | ICD-10-CM | POA: Diagnosis not present

## 2024-03-26 DIAGNOSIS — Z87891 Personal history of nicotine dependence: Secondary | ICD-10-CM | POA: Diagnosis not present

## 2024-03-26 DIAGNOSIS — Z853 Personal history of malignant neoplasm of breast: Secondary | ICD-10-CM | POA: Diagnosis not present

## 2024-03-26 DIAGNOSIS — R509 Fever, unspecified: Secondary | ICD-10-CM | POA: Insufficient documentation

## 2024-03-26 DIAGNOSIS — D630 Anemia in neoplastic disease: Secondary | ICD-10-CM | POA: Insufficient documentation

## 2024-03-26 DIAGNOSIS — D649 Anemia, unspecified: Secondary | ICD-10-CM

## 2024-03-26 LAB — CBC WITH DIFFERENTIAL/PLATELET
Basophils Absolute: 0 K/uL (ref 0.0–0.1)
Basophils Relative: 0 %
Eosinophils Absolute: 0 K/uL (ref 0.0–0.5)
Eosinophils Relative: 1 %
HCT: 26.8 % — ABNORMAL LOW (ref 36.0–46.0)
Hemoglobin: 8 g/dL — ABNORMAL LOW (ref 12.0–15.0)
Lymphocytes Relative: 71 %
Lymphs Abs: 1.5 K/uL (ref 0.7–4.0)
MCH: 28.6 pg (ref 26.0–34.0)
MCHC: 29.9 g/dL — ABNORMAL LOW (ref 30.0–36.0)
MCV: 95.7 fL (ref 80.0–100.0)
Monocytes Absolute: 0.2 K/uL (ref 0.1–1.0)
Monocytes Relative: 8 %
Neutro Abs: 0.4 K/uL — CL (ref 1.7–7.7)
Neutrophils Relative %: 20 %
Platelets: 128 K/uL — ABNORMAL LOW (ref 150–400)
RBC: 2.8 MIL/uL — ABNORMAL LOW (ref 3.87–5.11)
RDW: 20.1 % — ABNORMAL HIGH (ref 11.5–15.5)
WBC: 2.1 K/uL — ABNORMAL LOW (ref 4.0–10.5)
nRBC: 0.9 % — ABNORMAL HIGH (ref 0.0–0.2)

## 2024-03-26 LAB — TYPE AND SCREEN
ABO/RH(D): O POS
Antibody Screen: NEGATIVE

## 2024-03-26 LAB — COMPREHENSIVE METABOLIC PANEL WITH GFR
ALT: 12 U/L (ref 0–44)
AST: 29 U/L (ref 15–41)
Albumin: 3.7 g/dL (ref 3.5–5.0)
Alkaline Phosphatase: 54 U/L (ref 38–126)
Anion gap: 8 (ref 5–15)
BUN: 10 mg/dL (ref 8–23)
CO2: 24 mmol/L (ref 22–32)
Calcium: 8.5 mg/dL — ABNORMAL LOW (ref 8.9–10.3)
Chloride: 100 mmol/L (ref 98–111)
Creatinine, Ser: 0.84 mg/dL (ref 0.44–1.00)
GFR, Estimated: >60 mL/min (ref >60)
Glucose, Bld: 117 mg/dL — ABNORMAL HIGH (ref 70–99)
Potassium: 4.3 mmol/L (ref 3.5–5.1)
Sodium: 131 mmol/L — ABNORMAL LOW (ref 135–145)
Total Bilirubin: 0.6 mg/dL (ref 0.0–1.2)
Total Protein: 8.4 g/dL — ABNORMAL HIGH (ref 6.5–8.1)

## 2024-03-26 LAB — URINALYSIS, W/ REFLEX TO CULTURE (INFECTION SUSPECTED)
Bilirubin Urine: NEGATIVE
Glucose, UA: NEGATIVE mg/dL
Hgb urine dipstick: NEGATIVE
Ketones, ur: NEGATIVE mg/dL
Leukocytes,Ua: NEGATIVE
Nitrite: NEGATIVE
Protein, ur: 100 mg/dL — AB
Specific Gravity, Urine: 1.011 (ref 1.005–1.030)
pH: 5 (ref 5.0–8.0)

## 2024-03-26 LAB — RESP PANEL BY RT-PCR (RSV, FLU A&B, COVID)  RVPGX2
Influenza A by PCR: NEGATIVE
Influenza B by PCR: NEGATIVE
Resp Syncytial Virus by PCR: NEGATIVE
SARS Coronavirus 2 by RT PCR: NEGATIVE

## 2024-03-26 LAB — TROPONIN T, HIGH SENSITIVITY: Troponin T High Sensitivity: <15 ng/L (ref 0–19)

## 2024-03-26 MED ORDER — SODIUM CHLORIDE 0.9 % IV BOLUS
1000.0000 mL | Freq: Once | INTRAVENOUS | Status: AC
  Administered 2024-03-26: 1000 mL via INTRAVENOUS

## 2024-03-26 NOTE — ED Triage Notes (Signed)
 BIBA from home for generalized weakness/malaise for one week, low grade fever. Had a biopsy done about a week ago on right side, is having pain in her right arm. 650 mg Tylenol  and 500 cc NS given PTA. 99.7 t 128/60 bp 98% r/a 158 cbg 74 hr

## 2024-03-26 NOTE — Discharge Instructions (Addendum)
 While you were in the emergency room, you have blood work done that showed that your hemoglobin was 8.  This is at the low end of normal for you.  I have sent a message to your oncologist to schedule you for a follow-up appointment.  They may recommend some outpatient treatment.  In the meantime, I recommend that you stop taking your Bactrim .  You may continue taking all of your other medications as prescribed.  Return to the emergency department if you lose consciousness, notice blood in your stool, or dark stools.  Your shoulder x-ray was normal.  Follow-up with your primary care doctor.

## 2024-03-26 NOTE — ED Provider Notes (Addendum)
 [place] EMERGENCY DEPARTMENT AT [facility] Provider Note  CSN: [id number] Arrival date & time: 03/26/24 2047  Chief Complaint(s) Weakness  HPI Marie Church is a 68 y.o. female who is here today for generalized weakness and malaise of 1 week.  Patient reports that she had a right breast biopsy done on an outpatient basis little over 1 week ago, was started on Bactrim  as prophylaxis.  She states that since then, she has been feeling unwell, fatigued.  She has a history of chronic anemia, follows with oncology.  Patient not currently taking any chemotherapeutic drugs.  Denies any cough, congestion.  Denies any abdominal pain, dysuria or increased urinary frequency.   Past Medical History Past Medical History:  Diagnosis Date   Anemia    Anxiety    Arthritis    Bacteremia 07/31/2022   Breast cancer (HCC)    Cataract    CHF (congestive heart failure) (HCC)    Pt states issues is resolved   DM type 2 (diabetes mellitus, type 2) (HCC)    reportedly steroid induced   Hypertension    Pneumonia    Port or reservoir infection 07/30/2022   Streptococcal bacteremia 07/30/2022   Thrombocytopenia 03/30/2021   Patient Active Problem List   Diagnosis Date Noted   ANA positive 01/20/2024   Elevated rheumatoid factor 01/20/2024   Leukopenia 09/24/2023   Elevated total protein 06/18/2023   Healthcare maintenance 06/18/2023   Hypokalemia 08/02/2022   Malnutrition of moderate degree 04/07/2021   HFrEF (heart failure with reduced ejection fraction) (HCC) 03/30/2021   Breast cancer metastasized to axillary lymph node, right (HCC) 11/30/2020   Severe protein-calorie malnutrition 08/16/2020   Normocytic anemia 07/31/2020   GERD without esophagitis 07/31/2020   Mediastinal lymphadenopathy 03/12/2020   Physical debility 01/30/2020   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 Authorizing Provider  carvedilol  (COREG ) 12.5 MG tablet Take 1 tablet (12.5 mg total) by mouth 2 (two) times daily with a meal. 02/18/24   Cleaver, Josefa HERO, NP  ENTRESTO  24-26 MG Take 1 tablet by mouth 2 (two) times daily. 02/18/24   Emelia Josefa HERO, NP  famotidine  (PEPCID ) 40 MG tablet Take 1 tablet (40 mg total) by mouth daily. 09/15/23   Fleming, Zelda W, NP  FARXIGA  10 MG TABS tablet Take 1 tablet (10 mg total) by mouth daily. 02/18/24   Emelia Josefa HERO, NP  HYDROcodone -acetaminophen  (NORCO/VICODIN) 5-325 MG tablet Take 1 tablet by mouth every 8 (eight) hours as needed for moderate pain (pain score 4-6). 02/14/24   Urbano Albright, MD  spironolactone  (ALDACTONE ) 25 MG tablet Take 1 tablet (25 mg total) by mouth daily. 02/18/24   Emelia Josefa HERO, NP  Past Surgical History Past Surgical History:  Procedure Laterality Date   BREAST LUMPECTOMY Right 11/30/2020   BRONCHIAL BIOPSY  04/09/2021   Procedure: BRONCHIAL BIOPSIES;  Surgeon: Gladis Leonor HERO, MD;  Location: [facility];  Service: Pulmonary;;   BRONCHIAL NEEDLE ASPIRATION BIOPSY  04/09/2021   Procedure: BRONCHIAL NEEDLE ASPIRATION BIOPSIES;  Surgeon: Gladis Leonor HERO, MD;  Location: [facility];  Service: Pulmonary;;   BRONCHIAL WASHINGS  04/09/2021   Procedure: BRONCHIAL WASHINGS;  Surgeon: Gladis Leonor HERO, MD;  Location: [facility];  Service: Pulmonary;;   ENDOBRONCHIAL ULTRASOUND N/A 04/09/2021   Procedure: ENDOBRONCHIAL ULTRASOUND;  Surgeon: Gladis Leonor HERO, MD;  Location: [facility];  Service: Pulmonary;  Laterality: N/A;   HEMOSTASIS CONTROL  04/09/2021   Procedure: HEMOSTASIS CONTROL;  Surgeon: Gladis Leonor HERO, MD;  Location: [facility];  Service:  Pulmonary;;   IR REMOVAL TUN ACCESS W/ PORT W/O FL MOD SED  07/30/2022   RIGHT/LEFT HEART CATH AND CORONARY ANGIOGRAPHY N/A 10/10/2020   Procedure: RIGHT/LEFT HEART CATH AND CORONARY ANGIOGRAPHY;  Surgeon: Cherrie Toribio SAUNDERS, MD;  Location: [facility];  Service: Cardiovascular;  Laterality: N/A;   VIDEO BRONCHOSCOPY N/A 04/09/2021   Procedure: VIDEO BRONCHOSCOPY WITH FLUORO;  Surgeon: Gladis Leonor HERO, MD;  Location: [facility];  Service: Pulmonary;  Laterality: N/A;   Family History Family History  Problem Relation Age of Onset   Heart failure Neg Hx    Heart disease Neg Hx    Heart attack Neg Hx    Colon polyps Neg Hx    Colon cancer Neg Hx    Esophageal cancer Neg Hx    Stomach cancer Neg Hx    Rectal cancer Neg Hx     Social History Social History   Tobacco Use   Smoking status: Former    Current packs/day: 0.00    Types: Cigarettes    Start date: 33    Quit date: 2000    Years since quitting: 25.8   Smokeless tobacco: Never  Vaping Use   Vaping status: Never Used  Substance Use Topics   Alcohol use: Not Currently   Drug use: Never   Allergies Doxycycline, Amoxicillin-pot clavulanate, and Lisinopril  Review of Systems Review of Systems  Physical Exam Vital Signs  I have reviewed the triage vital signs BP 139/76 (BP Location: Left Arm)   Pulse 75   Temp 98.7 F (37.1 C) (Oral)   Resp 18   Ht 5' 6 (1.676 m)   Wt 89.8 kg   SpO2 95%   BMI 31.96 kg/m   Physical Exam Vitals and nursing note reviewed.  Constitutional:      Appearance: Normal appearance.  HENT:     Head: Normocephalic and atraumatic.  Eyes:     Pupils: Pupils are equal, round, and reactive to light.  Cardiovascular:     Rate and Rhythm: Normal rate.  Pulmonary:     Effort: Pulmonary effort is normal.     Breath sounds: Normal breath sounds.  Abdominal:     General: Abdomen is flat. There is no distension.     Palpations: Abdomen is soft.     Tenderness: There is no  abdominal tenderness.  Musculoskeletal:        General: Normal range of motion.  Skin:    General: Skin is warm and dry.  Neurological:     General: No focal deficit present.     Mental Status: She is alert and oriented to person, place, and time.  Cranial Nerves: No cranial nerve deficit.     Motor: No weakness.     Gait: Gait normal.  Psychiatric:        Mood and Affect: Mood normal.     ED Results and Treatments Labs (all labs ordered are listed, but only abnormal results are displayed) Labs Reviewed  COMPREHENSIVE METABOLIC PANEL WITH GFR - Abnormal; Notable for the following components:      Result Value   Sodium 131 (*)    Glucose, Bld 117 (*)    Calcium 8.5 (*)    Total Protein 8.4 (*)    All other components within normal limits  CBC WITH DIFFERENTIAL/PLATELET - Abnormal; Notable for the following components:   WBC 2.1 (*)    RBC 2.80 (*)    Hemoglobin 8.0 (*)    HCT 26.8 (*)    MCHC 29.9 (*)    RDW 20.1 (*)    Platelets 128 (*)    nRBC 0.9 (*)    Neutro Abs 0.4 (*)    All other components within normal limits  URINALYSIS, W/ REFLEX TO CULTURE (INFECTION SUSPECTED) - Abnormal; Notable for the following components:   Protein, ur 100 (*)    Bacteria, UA RARE (*)    All other components within normal limits  RESP PANEL BY RT-PCR (RSV, FLU A&B, COVID)  RVPGX2  TYPE AND SCREEN  TROPONIN T, HIGH SENSITIVITY  TROPONIN T, HIGH SENSITIVITY                                                                                                                          Radiology DG Chest 1 View Result Date: 03/26/2024 EXAM: 1 VIEW XRAY OF THE CHEST 03/26/2024 09:31:00 PM COMPARISON: 09/22/2022 CLINICAL HISTORY: Cough. FINDINGS: LUNGS AND PLEURA: No focal pulmonary opacity. No pleural effusion. No pneumothorax. HEART AND MEDIASTINUM: No acute abnormality of the cardiac and mediastinal silhouettes. BONES AND SOFT TISSUES: No acute osseous abnormality. IMPRESSION: 1. No acute  process. Electronically signed by: Oneil Devonshire MD 03/26/2024 09:35 PM EST RP Workstation: [id number]    Pertinent labs & imaging results that were available during my care of the patient were reviewed by me and considered in my medical decision making (see MDM for details).  Medications Ordered in ED Medications  sodium chloride  0.9 % bolus 1,000 mL (1,000 mLs Intravenous New Bag/Given 03/26/24 2247)  Procedures Procedures  (including critical care time)  Medical Decision Making / ED Course   This patient presents to the ED for concern of fatigue, weakness, this involves an extensive number of treatment options, and is a complaint that carries with it a high risk of complications and morbidity.  The differential diagnosis includes anemia, chronic anemia, neutropenia, less likely sepsis, consider cystitis, recent antibiotic use.  MDM: On exam, patient has normal vital signs, is well-appearing.  She is able to ambulate to and from the bathroom without assistance.  Given her symptoms, obtained an EKG, which per my independent review, shows no segment changes from prior tracing in October.  Patient's initial troponin negative, given her symptoms lasting longer than 1 week, do not believe she requires delta troponin.  Patient's blood work does show an anemia, with hemoglobin of 8.  She has had issues with chronic anemia, hemoglobin 2 weeks ago was 8.3.  Believe this is likely contributing to her symptoms.  Patient noted to be neutropenic, however she has been borderline neutropenic several times.  She does not have fever here.  I examined the site for the patient of the needle biopsy, there is no erythema, warmth or fluctuance.  My independent review the patient's chest x-ray shows no pneumonia.  Urine does not show any signs of infection.  Viral panel  negative.  Patient received a liter of IV fluids, does feel a bit better.  Ongoing of the patient follow-up with her oncologist to discuss her anemia further.  Patient may benefit from outpatient blood transfusion.  Will discharge patient.  Reassessment 11 PM-patient endorsing shoulder pain at discharge.  Plain films ordered and reviewed.  Negative for fracture.  Additional history obtained: -Additional history obtained from daughter at bedside -External records from outside source obtained and reviewed including: Chart review including previous notes, labs, imaging, consultation notes   Lab Tests: -I ordered, reviewed, and interpreted labs.   The pertinent results include:   Labs Reviewed  COMPREHENSIVE METABOLIC PANEL WITH GFR - Abnormal; Notable for the following components:      Result Value   Sodium 131 (*)    Glucose, Bld 117 (*)    Calcium 8.5 (*)    Total Protein 8.4 (*)    All other components within normal limits  CBC WITH DIFFERENTIAL/PLATELET - Abnormal; Notable for the following components:   WBC 2.1 (*)    RBC 2.80 (*)    Hemoglobin 8.0 (*)    HCT 26.8 (*)    MCHC 29.9 (*)    RDW 20.1 (*)    Platelets 128 (*)    nRBC 0.9 (*)    Neutro Abs 0.4 (*)    All other components within normal limits  URINALYSIS, W/ REFLEX TO CULTURE (INFECTION SUSPECTED) - Abnormal; Notable for the following components:   Protein, ur 100 (*)    Bacteria, UA RARE (*)    All other components within normal limits  RESP PANEL BY RT-PCR (RSV, FLU A&B, COVID)  RVPGX2  TYPE AND SCREEN  TROPONIN T, HIGH SENSITIVITY  TROPONIN T, HIGH SENSITIVITY      EKG sinus rhythm, no ST segment depressions or elevations, no acute ischemia.  EKG Interpretation Date/Time:  Sunday March 26 2024 21:35:14 EST Ventricular Rate:  74 PR Interval:  157 QRS Duration:  79 QT Interval:  398 QTC Calculation: 442 R Axis:   34  Text Interpretation: Sinus rhythm Abnormal R-wave progression, early  transition No significant change since last tracing Baseline wander  in lead(s) II aVF Confirmed by Mannie Pac [phone]) on 03/26/2024 10:49:58 PM         Imaging Studies ordered: I ordered imaging studies including chest x-ray I independently visualized and interpreted imaging. I agree with the radiologist interpretation   Medicines ordered and prescription drug management: Meds ordered this encounter  Medications   sodium chloride  0.9 % bolus 1,000 mL    -I have reviewed the patients home medicines and have made adjustments as needed  Cardiac Monitoring: The patient was maintained on a cardiac monitor.  I personally viewed and interpreted the cardiac monitored which showed an underlying rhythm of: Normal sinus rhythm  Social Determinants of Health:  Factors impacting patients care include: Lack of access to primary care   Reevaluation: After the interventions noted above, I reevaluated the patient and found that they have :improved  Co morbidities that complicate the patient evaluation  Past Medical History:  Diagnosis Date   Anemia    Anxiety    Arthritis    Bacteremia 07/31/2022   Breast cancer (HCC)    Cataract    CHF (congestive heart failure) (HCC)    Pt states issues is resolved   DM type 2 (diabetes mellitus, type 2) (HCC)    reportedly steroid induced   Hypertension    Pneumonia    Port or reservoir infection 07/30/2022   Streptococcal bacteremia 07/30/2022   Thrombocytopenia 03/30/2021      Dispostion: I considered admission for this patient, however she is appropriate for outpatient follow-up.     Final Clinical Impression(s) / ED Diagnoses Final diagnoses:  Generalized weakness  Anemia, unspecified type     @PCDICTATION @    Mannie Pac T, DO 03/26/24 2252    Mannie Pac T, DO 03/26/24 2253    Mannie Pac T, DO 03/26/24 2309

## 2024-03-27 ENCOUNTER — Ambulatory Visit
Admission: RE | Admit: 2024-03-27 | Discharge: 2024-03-27 | Disposition: A | Source: Ambulatory Visit | Attending: Oncology | Admitting: Oncology

## 2024-03-27 DIAGNOSIS — C50911 Malignant neoplasm of unspecified site of right female breast: Secondary | ICD-10-CM

## 2024-03-27 DIAGNOSIS — T888XXD Other specified complications of surgical and medical care, not elsewhere classified, subsequent encounter: Secondary | ICD-10-CM

## 2024-04-17 ENCOUNTER — Encounter: Admitting: Physical Medicine & Rehabilitation

## 2024-05-01 ENCOUNTER — Encounter: Payer: Self-pay | Admitting: Oncology

## 2024-05-01 NOTE — Assessment & Plan Note (Addendum)
 Diagnosed in November 2021 and managed at [place], [place].  ER negative, PR negative, HER2 positive.  Originally plan was to treat her with [facility] regimen every 3 weeks for 6 cycles as part of neoadjuvant treatment.     C1 HP only due to poor PS. 05/16/20 added weekly Taxol. (THP)  She had Herceptin 5/13 and then was admitted with CHF [facility]) prior to her next treatment   She ended up having lumpectomy 11/2020 - 6 foci of IDC, largest 4mm but did have LVI and 2/18 nodes  12/2020 re-excision of positive margin   Completed 5 cycles Kadcyla 06/20/2021 - stopped due to low PLT   HER2 positive status has a slightly higher recurrence risk  Reports right breast pain and a palpable hard area, distinct from usual post-surgical puffiness, present for two weeks. Located above the scar tissue from previous breast cancer surgery in November 2021.   Last mammogram in January 2025 was normal.   Since she had new symptoms in the right breast with swelling, pain and puffiness, we obtained diagnostic mammogram and ultrasound on 03/15/2024.  It showed nonspecific area at 12 o'clock position of right breast, measuring 1.8 cm x 1.2 cm.  Ultrasound-guided aspiration of fluid collection was obtained.  BI-RADS 3, probably benign.  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  She was prescribed Bactrim  but patient developed severe fatigue and generalized malaise after starting Bactrim .  She was advised to hold that.  She has ultrasound of the axilla scheduled on 03/27/2024 for further evaluation.  Further recommendations will follow after that.

## 2024-05-06 ENCOUNTER — Other Ambulatory Visit: Payer: Self-pay | Admitting: Nurse Practitioner

## 2024-05-06 DIAGNOSIS — I509 Heart failure, unspecified: Secondary | ICD-10-CM

## 2024-05-09 ENCOUNTER — Telehealth: Payer: Self-pay | Admitting: Physical Medicine & Rehabilitation

## 2024-05-09 MED ORDER — HYDROCODONE-ACETAMINOPHEN 5-325 MG PO TABS: 1.0000 | ORAL_TABLET | Freq: Three times a day (TID) | ORAL | 0 refills | Status: DC | PRN

## 2024-05-09 NOTE — Telephone Encounter (Signed)
 Pt, daughter call for her refill hydrocodone 

## 2024-05-22 ENCOUNTER — Encounter: Attending: Physical Medicine & Rehabilitation | Admitting: Physical Medicine & Rehabilitation

## 2024-05-22 ENCOUNTER — Encounter: Payer: Self-pay | Admitting: Physical Medicine & Rehabilitation

## 2024-05-22 VITALS — BP 138/83 | HR 92 | Ht 66.0 in | Wt 201.0 lb

## 2024-05-22 DIAGNOSIS — G894 Chronic pain syndrome: Secondary | ICD-10-CM | POA: Diagnosis not present

## 2024-05-22 DIAGNOSIS — Z5181 Encounter for therapeutic drug level monitoring: Secondary | ICD-10-CM | POA: Diagnosis not present

## 2024-05-22 DIAGNOSIS — M170 Bilateral primary osteoarthritis of knee: Secondary | ICD-10-CM | POA: Diagnosis not present

## 2024-05-22 MED ORDER — HYDROCODONE-ACETAMINOPHEN 5-325 MG PO TABS: 1.0000 | ORAL_TABLET | Freq: Three times a day (TID) | ORAL | 0 refills | Status: AC | PRN

## 2024-05-22 MED ORDER — TRIAMCINOLONE ACETONIDE 32 MG IX SRER
64.0000 mg | Freq: Once | INTRA_ARTICULAR | Status: AC
  Administered 2024-05-22: 64 mg via INTRA_ARTICULAR

## 2024-05-22 NOTE — Progress Notes (Signed)
 "  Subjective:    Patient ID: Marie Church, female    DOB: February 17, 1956, 69 y.o.   MRN: [id number]  HPI HPI  Marie Church is a 69 y.o. year old female  who  has a past medical history of Anemia, Anxiety, Arthritis, Bacteremia (07/31/2022), Breast cancer (HCC), Cataract, CHF (congestive heart failure) (HCC), DM type 2 (diabetes mellitus, type 2) (HCC), Hypertension, Pneumonia, Port or reservoir infection (07/30/2022), Streptococcal bacteremia (07/30/2022), and Thrombocytopenia (03/30/2021).   They are presenting to PM&R clinic as a new patient for pain management evaluation. They were referred by Dr. Addie for treatment of b/l Knee OA  pain.  Patient has had pain in her knees bilaterally for over 10 years.  Pain is worse in her right knee than her left knee.  Denies any history of trauma to her knees or surgery to her knees.  Standing and walking worsens her pain.  She had a right knee injection, was on by Dr. Addie 12/28/2022 for severe knee OA.  Dr. Addie discussed TKA however patient not interested at this time.  She previously had chemotherapy and x-ray treatment for breast cancer.   Red flag symptoms: No red flags for back pain endorsed in Hx or ROS  Medications tried:  Topical medications Voltaren  doesn't help her- made it hurt worse Nsaids Ibuprofen  minimal benefit  Tylenol   Helps slightly  Opiates  Tramadol  helped her move around a little better Gabapentin  / Lyrica  - Denies  TCAs  - Denies  SNRIs  Duloxetine - denies  Other  - denies   Other treatments: PT- for her knees was helpful a few years ago  TENs unit - denies  Injections Cortisone knee injection  Surgery denies    Interval History 04/05/23 Patient is here for follow-up regarding her severe chronic knee pain.  Pain continues to be poorly controlled and makes it very difficult for her to ambulate.  Pain continues to be worse over the right knee compared to her left knee.  Tramadol  is no longer helping her pain and  keep it under control.  She reports last cortisone injection she had in her right knee helped for a few weeks.  Interval History 04/22/2023 Patient is here for bilateral knee injection with Zilretta .  She reports she has not had any knee injection in over 3 months.  She reports that Norco 5 has been helping keep her pain at a more tolerable level.  Denies any side effects with this medication.  Denies any new significant changes in her medical history.   Interval History 06/14/23 Pt reports zilretta  injections last visit was very beneficial. Injections are starting to wear off.  She continues to use Norco 5 with benefit, uses it only when pain is severe.  Last Rx lasted over 2 months.  No side effects with the medication.   Interval History 08/03/23 Patient reports Xarelto lasted for about 2 months after prior injection and then started to wear off.  She is here for repeat injection today.  She continues to use Norco 5 with reduction in her pain, she tries to use this sparingly only when pain is very severe.  She forgot to take her BP medications last night and this morning.  Interval History 11/05/23 Patient is here for her chronic pain follow-up.  Patient was wanting repeat bilateral injection with Zilretta  for her knees, reports this helped well last time.  Medications do not wear off.  She continues to use Norco 5 mg when pain  is particularly severe.  No side effects with the medication.  Interval History 11/05/23 Pt continues to have pain in both knees. She is here for zilretta  injections to both knees today.  She has just picked up last refill of norco. Norco has been helping her pain, no side effects.    Interval History 02/14/24 Follow-up for bilateral knee pain. Reports prior long-acting cortisone injections 3 months ago provided good relief for approximately 2 months. Pain is described as significant. Has a known diagnosis of arthritis in the knees.  Denies constipation.  - Hydrocodone   (Norco): Helps her pain without significant side effects. Takes the edge of the pain. She tries to use this only when pain is severe.   Interval History 05/22/24 Pt here for b/l knee injection. Report it was helpful prior visit but is wearing off. Norco continues to help keep pain under control. No side effects with the medication.   Pain Inventory Average Pain 9 Pain Right Now 7 My pain is intermittent and dull  In the last 24 hours, has pain interfered with the following? General activity 7 Relation with others 9 Enjoyment of life 7 What TIME of day is your pain at its worst? morning , daytime, evening, and night Sleep (in general) Good  Pain is worse with: walking, standing, and some activites Pain improves with: medication and injections Relief from Meds: 9       Family History  Problem Relation Age of Onset   Heart failure Neg Hx    Heart disease Neg Hx    Heart attack Neg Hx    Colon polyps Neg Hx    Colon cancer Neg Hx    Esophageal cancer Neg Hx    Stomach cancer Neg Hx    Rectal cancer Neg Hx    Social History   Socioeconomic History   Marital status: Widowed    Spouse name: Not on file   Number of children: Not on file   Years of education: Not on file   Highest education level: Not on file  Occupational History   Not on file  Tobacco Use   Smoking status: Former    Current packs/day: 0.00    Types: Cigarettes    Start date: 62    Quit date: 2000    Years since quitting: 26.0   Smokeless tobacco: Never  Vaping Use   Vaping status: Never Used  Substance and Sexual Activity   Alcohol use: Not Currently   Drug use: Never   Sexual activity: Not on file  Other Topics Concern   Not on file  Social History Narrative   Not on file   Social Drivers of Health   Tobacco Use: Medium Risk (05/01/2024)   Patient History    Smoking Tobacco Use: Former    Smokeless Tobacco Use: Never    Passive Exposure: Not on file  Financial Resource Strain: Low  Risk (02/27/2022)   Received from [facility]   Overall Financial Resource Strain (CARDIA)    Difficulty of Paying Living Expenses: Not hard at all  Food Insecurity: No Food Insecurity (07/30/2022)   Hunger Vital Sign    Worried About Running Out of Food in the Last Year: Never true    Ran Out of Food in the Last Year: Never true  Transportation Needs: No Transportation Needs (07/30/2022)   PRAPARE - Administrator, Civil Service (Medical): No    Lack of Transportation (Non-Medical): No  Physical  Activity: Not on file  Stress: Not on file  Social Connections: Not on file  Depression (PHQ2-9): Low Risk (02/14/2024)   Depression (PHQ2-9)    PHQ-2 Score: 0  Alcohol Screen: Not on file  Housing: Low Risk (07/30/2022)   Housing    Last Housing Risk Score: 0  Utilities: Not At Risk (07/30/2022)   AHC Utilities    Threatened with loss of utilities: No  Health Literacy: Adequate Health Literacy (04/29/2023)   B1300 Health Literacy    Frequency of need for help with medical instructions: Never   Past Surgical History:  Procedure Laterality Date   BREAST LUMPECTOMY Right 11/30/2020   BRONCHIAL BIOPSY  04/09/2021   Procedure: BRONCHIAL BIOPSIES;  Surgeon: Gladis Leonor HERO, MD;  Location: [facility];  Service: Pulmonary;;   BRONCHIAL NEEDLE ASPIRATION BIOPSY  04/09/2021   Procedure: BRONCHIAL NEEDLE ASPIRATION BIOPSIES;  Surgeon: Gladis Leonor HERO, MD;  Location: [facility];  Service: Pulmonary;;   BRONCHIAL WASHINGS  04/09/2021   Procedure: BRONCHIAL WASHINGS;  Surgeon: Gladis Leonor HERO, MD;  Location: [facility];  Service: Pulmonary;;   ENDOBRONCHIAL ULTRASOUND N/A 04/09/2021   Procedure: ENDOBRONCHIAL ULTRASOUND;  Surgeon: Gladis Leonor HERO, MD;  Location: [facility];  Service: Pulmonary;  Laterality: N/A;   HEMOSTASIS CONTROL  04/09/2021   Procedure: HEMOSTASIS CONTROL;  Surgeon: Gladis Leonor HERO, MD;  Location: [facility];   Service: Pulmonary;;   IR REMOVAL TUN ACCESS W/ PORT W/O FL MOD SED  07/30/2022   RIGHT/LEFT HEART CATH AND CORONARY ANGIOGRAPHY N/A 10/10/2020   Procedure: RIGHT/LEFT HEART CATH AND CORONARY ANGIOGRAPHY;  Surgeon: Cherrie Toribio SAUNDERS, MD;  Location: [facility];  Service: Cardiovascular;  Laterality: N/A;   VIDEO BRONCHOSCOPY N/A 04/09/2021   Procedure: VIDEO BRONCHOSCOPY WITH FLUORO;  Surgeon: Gladis Leonor HERO, MD;  Location: [facility];  Service: Pulmonary;  Laterality: N/A;   Past Medical History:  Diagnosis Date   Anemia    Anxiety    Arthritis    Bacteremia 07/31/2022   Breast cancer (HCC)    Cataract    CHF (congestive heart failure) (HCC)    Pt states issues is resolved   DM type 2 (diabetes mellitus, type 2) (HCC)    reportedly steroid induced   Hypertension    Pneumonia    Port or reservoir infection 07/30/2022   Streptococcal bacteremia 07/30/2022   Thrombocytopenia 03/30/2021   There were no vitals taken for this visit.  Opioid Risk Score:   Fall Risk Score:  `1  Depression screen [name] 2/9     02/14/2024    3:25 PM 11/05/2023   10:38 AM 08/03/2023   12:39 PM 06/14/2023   10:09 AM 04/29/2023    9:32 AM 04/22/2023    2:31 PM 04/05/2023    3:25 PM  Depression screen PHQ 2/9  Decreased Interest 0 0 0 0 0 0 0  Down, Depressed, Hopeless 0 0 0 0 0 0 0  PHQ - 2 Score 0 0 0 0 0 0 0  Altered sleeping  0   0    Tired, decreased energy  0   3    Change in appetite  0   0    Feeling bad or failure about yourself   0   0    Trouble concentrating  0   0    Moving slowly or fidgety/restless  0   0    Suicidal thoughts  0   0    PHQ-9 Score  0  3     Difficult doing work/chores     Extremely dIfficult       Data saved with a previous flowsheet row definition    Review of Systems  Constitutional: Negative.   HENT: Negative.    Eyes: Negative.   Respiratory: Negative.    Cardiovascular: Negative.   Gastrointestinal: Negative.   Endocrine: Negative.    Genitourinary: Negative.   Musculoskeletal:  Positive for gait problem.       Pain in both knees  Skin: Negative.   Allergic/Immunologic: Negative.   Hematological: Negative.   Psychiatric/Behavioral: Negative.    All other systems reviewed and are negative.      Objective:   Physical Exam     05/22/2024    3:08 PM 03/26/2024   11:32 PM 03/26/2024   10:45 PM  Vitals with BMI  Height 5' 6    Weight 201 lbs    BMI 32.46    Systolic 138 141   Diastolic 83 79   Pulse 92 67 68     Gen: no distress, normal appearing HEENT: oral mucosa pink and moist, NCAT Chest: normal effort, normal rate of breathing Abd: soft, non-distended Ext: no edema Psych: pleasant, normal affect Skin: intact, no breakdown noted Neuro: alert and awake, follows commands RUE: moving all 4 to gravity and resistance Sensory exam normal for light touch and pain in all 4 limbs.   Musculoskeletal:  Bilateral knee joint line tenderness ttp Antalgic gait        Assessment & Plan:   1) Chronic b/l Knee pain with b/l Knee OA Right greater than Left  2) Hx of breast cancer  3) HTN  -Discussed PT, pt agreeable, referral placed -Zynex, Knee brace ordered prior visit -Hold off on TENS due to prior cancer Hx -UDS and pain agreement today -Tramadol  50mg  TID PRN-was discontinued prior visit -Continue Norco 5-325 TID PRN,ordered -Continue UDS and pill counts.  Continue PDMP monitoring.  Pain contract completed prior visit. -Consider Genicular nerve block  -Pain is fairly constant consider Butrans patch  at later visit   Knee injection today with Zilretta  bilateral   Indication:Knee pain not relieved by medication management and other conservative care.   Informed consent was obtained after describing risks and benefits of the procedure with the patient, this includes bleeding, bruising, infection and medication side effects. The patient wishes to proceed and has given written consent. The patient was  placed in a recumbent position. The Lateral aspect of the knee was marked and prepped with Betadine and alcohol. It was then entered with a 21-gauge 1-1/2 inch needle.  After negative draw back for blood, a solution containing 5 ML of 32 mg Zilretta  solution was injected. The patient tolerated the procedure well.   Pt had some soreness in her knees that improved after a few minutes.   Post procedure instructions were given.    Procedure was completed for both knees  Warnings -10/28/2023 PDMP review indicates she picked up few days of tramadol  from different provider.  Appears this is from ER as she reports she did not use it and will bring it in.  Verbal warning provided.  "

## 2024-05-23 ENCOUNTER — Ambulatory Visit: Admitting: *Deleted

## 2024-05-23 VITALS — Ht 66.0 in | Wt 200.0 lb

## 2024-05-23 DIAGNOSIS — Z1211 Encounter for screening for malignant neoplasm of colon: Secondary | ICD-10-CM

## 2024-05-23 MED ORDER — NA SULFATE-K SULFATE-MG SULF 17.5-3.13-1.6 GM/177ML PO SOLN: 1.0000 | Freq: Once | ORAL | 0 refills | Status: AC

## 2024-05-23 NOTE — Progress Notes (Signed)
 Pt's name and DOB verified at the beginning of the pre-visit with 2 identifiers  Permission given to speak with  Pt denies any difficulty with ambulating,sitting, laying down or rolling side to side  Pt has no issues moving head neck or swallowing  No egg or soy allergy known to patient   No issues known to pt with past sedation  No FH of Malignant Hyperthermia  Pt is not on home 02   Pt is not on blood thinners    Pt is not on dialysis  Pt denise any abnormal heart rhythms Has heart murmur  Pt denies any upcoming cardiac testing  Patient's chart reviewed by Norleen Schillings CNRA prior to pre-visit and patient appropriate for the [facility].  Pre-visit completed and red dot placed by patient's name on their procedure day (on provider's schedule).    Visit in person  Pt states weight is200 lb    Pt given  both [facility] main # and MD on call # prior to instructions.  Informed pt to come in at the time discussed and is shown on PV instructions.  Pt instructed to use Single[employer] or GoodRx for a price reduction on prep  Copy of instructions given to pt Instructed pt on all aspects of written instructions including med holds clothing to wear and foods to eat and not eat as well as after procedure legal restrictions and to call MD on call if needed.. Pt states understanding. Instructed pt to review instructions again prior to procedure and call main # given if has any questions or any issues. Pt states they will.

## 2024-05-24 ENCOUNTER — Inpatient Hospital Stay (HOSPITAL_BASED_OUTPATIENT_CLINIC_OR_DEPARTMENT_OTHER): Admitting: Oncology

## 2024-05-24 ENCOUNTER — Encounter: Payer: Self-pay | Admitting: Oncology

## 2024-05-24 ENCOUNTER — Inpatient Hospital Stay: Attending: Oncology

## 2024-05-24 VITALS — BP 193/82 | HR 74 | Temp 98.4°F | Resp 18 | Ht 66.0 in | Wt 198.0 lb

## 2024-05-24 DIAGNOSIS — D649 Anemia, unspecified: Secondary | ICD-10-CM | POA: Insufficient documentation

## 2024-05-24 DIAGNOSIS — D72819 Decreased white blood cell count, unspecified: Secondary | ICD-10-CM

## 2024-05-24 DIAGNOSIS — N6489 Other specified disorders of breast: Secondary | ICD-10-CM | POA: Diagnosis not present

## 2024-05-24 DIAGNOSIS — Z923 Personal history of irradiation: Secondary | ICD-10-CM | POA: Insufficient documentation

## 2024-05-24 DIAGNOSIS — M1990 Unspecified osteoarthritis, unspecified site: Secondary | ICD-10-CM | POA: Insufficient documentation

## 2024-05-24 DIAGNOSIS — R778 Other specified abnormalities of plasma proteins: Secondary | ICD-10-CM | POA: Diagnosis not present

## 2024-05-24 DIAGNOSIS — C773 Secondary and unspecified malignant neoplasm of axilla and upper limb lymph nodes: Secondary | ICD-10-CM

## 2024-05-24 DIAGNOSIS — Z853 Personal history of malignant neoplasm of breast: Secondary | ICD-10-CM | POA: Insufficient documentation

## 2024-05-24 DIAGNOSIS — R779 Abnormality of plasma protein, unspecified: Secondary | ICD-10-CM | POA: Diagnosis not present

## 2024-05-24 DIAGNOSIS — R7689 Other specified abnormal immunological findings in serum: Secondary | ICD-10-CM

## 2024-05-24 DIAGNOSIS — Z9221 Personal history of antineoplastic chemotherapy: Secondary | ICD-10-CM | POA: Insufficient documentation

## 2024-05-24 DIAGNOSIS — C50911 Malignant neoplasm of unspecified site of right female breast: Secondary | ICD-10-CM

## 2024-05-24 LAB — CBC WITH DIFFERENTIAL (CANCER CENTER ONLY)
Abs Immature Granulocytes: 0.2 K/uL — ABNORMAL HIGH (ref 0.00–0.07)
Basophils Absolute: 0 K/uL (ref 0.0–0.1)
Basophils Relative: 0 %
Eosinophils Absolute: 0 K/uL (ref 0.0–0.5)
Eosinophils Relative: 0 %
HCT: 28.4 % — ABNORMAL LOW (ref 36.0–46.0)
Hemoglobin: 9 g/dL — ABNORMAL LOW (ref 12.0–15.0)
Immature Granulocytes: 6 %
Lymphocytes Relative: 38 %
Lymphs Abs: 1.3 K/uL (ref 0.7–4.0)
MCH: 29.7 pg (ref 26.0–34.0)
MCHC: 31.7 g/dL (ref 30.0–36.0)
MCV: 93.7 fL (ref 80.0–100.0)
Monocytes Absolute: 0.2 K/uL (ref 0.1–1.0)
Monocytes Relative: 5 %
Neutro Abs: 1.7 K/uL (ref 1.7–7.7)
Neutrophils Relative %: 51 %
Platelet Count: 164 K/uL (ref 150–400)
RBC: 3.03 MIL/uL — ABNORMAL LOW (ref 3.87–5.11)
RDW: 19.1 % — ABNORMAL HIGH (ref 11.5–15.5)
Smear Review: NORMAL
WBC Count: 3.3 K/uL — ABNORMAL LOW (ref 4.0–10.5)
nRBC: 0.6 % — ABNORMAL HIGH (ref 0.0–0.2)

## 2024-05-24 LAB — LACTATE DEHYDROGENASE
LDH: 161 U/L (ref 105–235)
LDH: 164 U/L (ref 105–235)

## 2024-05-24 LAB — FERRITIN
Ferritin: 266 ng/mL (ref 11–307)
Ferritin: 267 ng/mL (ref 11–307)

## 2024-05-24 LAB — IRON AND IRON BINDING CAPACITY (CC-WL,HP ONLY)
Iron: 86 ug/dL (ref 28–170)
Saturation Ratios: 27 % (ref 10.4–31.8)
TIBC: 319 ug/dL (ref 250–450)
UIBC: 233 ug/dL

## 2024-05-24 NOTE — Assessment & Plan Note (Addendum)
 As part of leukopenia workup, on 09/24/2023 we checked ANA and it was positive with reflex testing positive for anti-Ro and anti-La antibodies.  Rheumatoid factor was also elevated at 226 IU/mL.  Concern for underlying connective tissue disorder versus RA.  Referral sent to rheumatology previously on 2 occasions for further evaluation and patient has not heard from them.  Referral request submitted again today.

## 2024-05-24 NOTE — Assessment & Plan Note (Addendum)
 Chronic anemia with low hemoglobin levels. Hemoglobin was 11.2 in December 2024. Chemotherapy for breast cancer may have contributed. No current iron supplementation.   On her initial consultation with us  on 06/18/2023, labs revealed persistent anemia with hemoglobin of 10.2, hematocrit 32.8, MCV 94.5.  White count 3400 with normal differential, ANC of 1500.  Platelet count of 160,000.  CMP unremarkable except for total protein of 9.2 g/dL.  Creatinine was normal at 0.9, calcium normal at 9.4, albumin 4.1. Iron studies, ferritin, B12, folate, serum copper , LDH were within normal limits.   SPEP showed no evidence of M spike.  IFE showed polyclonal increase detected in one or more immunoglobulins.  Serum free kappa was elevated at 118 mg/L, serum free lambda was elevated at 33.6 mg/L, ratio elevated at 3.54.  Quantitative IgG was increased at 3368 mg/dL, IgA was also slightly increased at 375 mg/dL and IgM was also increased at 245 mg/dL.  Overall picture was not consistent with monoclonal gammopathy but rather polyclonal increase in proteins from uncertain etiology.  Normal renal function and normal calcium.   Labs today showed hemoglobin of 9, improved compared to prior.  Anemia seems to be from anemia of chronic disease at this time.  No indication for transfusion at current levels.  Plan to transfuse if hemoglobin is below 7.  Will continue surveillance.

## 2024-05-24 NOTE — Assessment & Plan Note (Addendum)
 Fluctuating leukopenia.  White blood cell count is currently 3,300, up from 3,000 previously, still below normal range. No signs of infection such as fever, chills, or respiratory symptoms.  Positive ANA.  Elevated rheumatoid factor.  This could be contributing to leukopenia.  Past chemotherapy may also be a contributing factor.  - Consider bone marrow biopsy if white blood cell count continues to decrease

## 2024-05-24 NOTE — Assessment & Plan Note (Addendum)
 Chronic, slightly elevated total protein of uncertain significance.    On her initial consultation with us  on 06/18/2023, labs revealed total protein of 9.2 g/dL.  Creatinine was normal at 0.9, calcium normal at 9.4, albumin 4.1.  Hemoglobin was stable at 10.2.   SPEP showed no evidence of M spike.  IFE showed polyclonal increase detected in one or more immunoglobulins.  Serum free kappa was elevated at 118 mg/L, serum free lambda was elevated at 33.6 mg/L, ratio elevated at 3.54.  Quantitative IgG was increased at 3368 mg/dL, IgA was also slightly increased at 375 mg/dL and IgM was also increased at 245 mg/dL.  Overall picture is not consistent with monoclonal gammopathy but rather polyclonal increase in proteins from uncertain etiology.  Normal renal function and normal calcium.    Total protein was slightly elevated at 9.9 on last clinic visit.  No hematological intervention is warranted at current levels.  On 09/24/2023, ANA was positive with reflex testing positive for anti-Ro and anti-La antibodies.  Rheumatoid factor was elevated at 266 IU/mL.  ESR was elevated at 123 mm/h.  CRP borderline at 1.  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  IFE showed polyclonal increase.  Both kappa and lambda were increased with ratio of 3.3, consistent with chronic disease picture.  No evidence of monoclonal gammopathy.  Elevated total protein seems to be polyclonal from underlying inflammatory conditions.  Will continue observation at this time.

## 2024-05-24 NOTE — Progress Notes (Signed)
 "  [facility]  HEMATOLOGY CLINIC PROGRESS NOTE  PATIENT NAME: Marie Church   MR#: [id number] DOB: [birth year]  Patient Care Team: Theotis Haze ORN, NP as PCP - General (Nurse Practitioner) Francyne Headland, MD as PCP - Cardiology (Cardiology)  Date of visit: 05/24/2024   ASSESSMENT & PLAN:   Marie Church is a 69 y.o. lady with a past medical history of stage II B right-sided breast cancer, ER negative, PR negative, HER2/neu positive, diagnosed in November 2021 and treated at [place], [place], hypertension, diabetes mellitus type 2, chronic thrombocytopenia, CHF from chemotherapy, which resolved, was referred to our service in January 2025 for evaluation of anemia and elevated total protein.     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  ER negative, PR negative, HER2 positive.  Originally plan was to treat her with [facility] regimen every 3 weeks for 6 cycles as part of neoadjuvant treatment.     C1 HP only due to poor PS. 05/16/20 added weekly Taxol. (THP)  She had Herceptin 5/13 and then was admitted with CHF [facility]) prior to her next treatment   She ended up having lumpectomy 11/2020 - 6 foci of IDC, largest 4mm but did have LVI and 2/18 nodes  12/2020 re-excision of positive margin   Completed 5 cycles Kadcyla 06/20/2021 - stopped due to low PLT   HER2 positive status has a slightly higher recurrence risk  Reports right breast pain and a palpable hard area, distinct from usual post-surgical puffiness, present for two weeks. Located above the scar tissue from previous breast cancer surgery in November 2021.   Last mammogram in January 2025 was normal.   Since she had new symptoms in the right breast with swelling, pain and puffiness, we obtained diagnostic mammogram and ultrasound on 03/15/2024.  It showed nonspecific area at 12 o'clock position of right breast, measuring 1.8 cm  x 1.2 cm.  Ultrasound-guided aspiration of fluid collection was obtained.  BI-RADS 3, probably benign.  Biopsy indicated benign findings/abscess. She was prescribed Bactrim  but patient developed severe fatigue and generalized malaise after starting Bactrim .  She was advised to hold that.  Repeat ultrasound of the axilla on 03/27/2024 showed BI-RADS 2, benign findings with decreased size of the complicated right breast fluid collection status post aspiration.  This may reflect a seroma.  Recommendation was for a diagnostic mammogram bilaterally in January 2026.  Request placed for this today.  I will plan to see her approximately in 3 months for follow-up.  ANA positive As part of leukopenia workup, on 09/24/2023 we checked ANA and it was positive with reflex testing positive for anti-Ro and anti-La antibodies.  Rheumatoid factor was also elevated at 226 IU/mL.  Concern for underlying connective tissue disorder versus RA.  Referral sent to rheumatology previously on 2 occasions for further evaluation and patient has not heard from them.  Referral request submitted again today.  Elevated total protein Chronic, slightly elevated total protein of uncertain significance.    On her initial consultation with us  on 06/18/2023, labs revealed total protein of 9.2 g/dL.  Creatinine was normal at 0.9, calcium normal at 9.4, albumin 4.1.  Hemoglobin was stable at 10.2.   SPEP showed no evidence of M spike.  IFE showed polyclonal increase detected in one or more immunoglobulins.  Serum free kappa was elevated at 118 mg/L, serum free lambda was elevated at 33.6 mg/L, ratio elevated at 3.54.  Quantitative IgG was increased at 3368 mg/dL, IgA was also slightly increased at 375 mg/dL and IgM was also increased at 245 mg/dL.  Overall picture is not consistent with monoclonal gammopathy but rather polyclonal increase in proteins from uncertain etiology.  Normal renal function and normal calcium.    Total protein was  slightly elevated at 9.9 on last clinic visit.  No hematological intervention is warranted at current levels.  On 09/24/2023, ANA was positive with reflex testing positive for anti-Ro and anti-La antibodies.  Rheumatoid factor was elevated at 266 IU/mL.  ESR was elevated at 123 mm/h.  CRP borderline at 1.  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  IFE showed polyclonal increase.  Both kappa and lambda were increased with ratio of 3.3, consistent with chronic disease picture.  No evidence of monoclonal gammopathy.  Elevated total protein seems to be polyclonal from underlying inflammatory conditions.  Will continue observation at this time.  Leukopenia Fluctuating leukopenia.  White blood cell count is currently 3,300, up from 3,000 previously, still below normal range. No signs of infection such as fever, chills, or respiratory symptoms.  Positive ANA.  Elevated rheumatoid factor.  This could be contributing to leukopenia.  Past chemotherapy may also be a contributing factor.  - Consider bone marrow biopsy if white blood cell count continues to decrease  Normocytic anemia Chronic anemia with low hemoglobin levels. Hemoglobin was 11.2 in December 2024. Chemotherapy for breast cancer may have contributed. No current iron supplementation.   On her initial consultation with us  on 06/18/2023, labs revealed persistent anemia with hemoglobin of 10.2, hematocrit 32.8, MCV 94.5.  White count 3400 with normal differential, ANC of 1500.  Platelet count of 160,000.  CMP unremarkable except for total protein of 9.2 g/dL.  Creatinine was normal at 0.9, calcium normal at 9.4, albumin 4.1. Iron studies, ferritin, B12, folate, serum copper , LDH were within normal limits.   SPEP showed no evidence of M spike.  IFE showed polyclonal increase detected in one or more immunoglobulins.  Serum free kappa was elevated at 118 mg/L, serum free lambda was elevated at 33.6  mg/L, ratio elevated at 3.54.  Quantitative IgG was increased at 3368 mg/dL, IgA was also slightly increased at 375 mg/dL and IgM was also increased at 245 mg/dL.  Overall picture was not consistent with monoclonal gammopathy but rather polyclonal increase in proteins from uncertain etiology.  Normal renal function and normal calcium.   Labs today showed hemoglobin of 9, improved compared to prior.  Anemia seems to be from anemia of chronic disease at this time.  No indication for transfusion at current levels.  Plan to transfuse if hemoglobin is below 7.  Will continue surveillance.    I spent a total of 32 minutes during this encounter with the patient including review of chart and various tests results, discussions about plan of care and coordination of care plan.  I reviewed lab results and outside records for this visit and discussed relevant results with the patient. Diagnosis, plan of care and treatment options were also discussed in detail with the patient. Opportunity provided to ask questions and answers provided to her apparent satisfaction. Provided instructions to call our clinic with any problems, questions or concerns prior to return visit. I recommended to continue follow-up with PCP and sub-specialists. She verbalized understanding and agreed with the plan. No barriers to learning was detected.  Chinita Patten, MD  05/24/2024 5:14 PM  [facility] - A DEPT OF [facility] [address] [place] [address] Dept: [phone] Dept Fax: [phone]   CHIEF COMPLAINT/ REASON FOR VISIT:  Follow-up for history of normocytic anemia, elevated total protein, stage II B right-sided breast cancer diagnosed in 2021 and managed at outside facility in [place], [place].   INTERVAL HISTORY:  Discussed the use of AI scribe software for clinical note transcription with the patient, who gave verbal consent to proceed.  History  of Present Illness  Marie Church is a 69 year old female with hx of stage 2 right breast cancer who presents for hematology and oncology follow-up to monitor disease status and cytopenias.  A recent right breast lesion biopsy revealed an abscess, for which she completed antibiotic therapy but experienced significant fatigue and other adverse effects. Subsequent breast ultrasound was unremarkable, and breast swelling has resolved. She is awaiting a diagnostic mammogram, scheduled for January but not yet confirmed. She currently denies breast swelling or new symptoms.  Her hemoglobin was 8.3 g/dL previously and is now 9 g/dL. Her platelet count is normal and her current white blood cell count is 3,300/L. Her iron labs are within normal limits. She denies blood in stools and black stools.  She is awaiting rheumatology evaluation and has not been contacted by rheumatology.  She is followed for hypertension.   SUMMARY OF HEMATOLOGIC HISTORY:  She has had chronic anemia and previously thrombocytopenia, presumed to be from chemotherapy.  She was also found to have elevated total protein which was 9.8 in October 2024 and 9.4 in December 2024.  Hence a referral was sent was for further evaluation of anemia and elevated total protein.   The patient is not currently on any medication for her breast cancer and is under observation. The patient underwent a lumpectomy for her right breast and had 18 lymph nodes removed, one of which was cancerous. The patient completed chemotherapy and radiation therapy nearly two years ago. The patient also reports having arthritis and is due for a knee replacement.   She denies fever, cough, diarrhea, or other infectious symptoms.  She denies epistaxis, bloody stool, melena, hematuria, bruising or other bleeding symptoms. She also denies unintentional weight loss, night sweats or other constitutional symptoms.   Chronic anemia with low hemoglobin levels. Hemoglobin  was 11.2 in December 2024. Chemotherapy for breast cancer may have contributed. No current iron supplementation.    On her initial consultation with us  on 06/18/2023, labs revealed persistent anemia with hemoglobin of 10.2, hematocrit 32.8, MCV 94.5.  White count 3400 with normal differential, ANC of 1500.  Platelet count of 160,000.  CMP unremarkable except for total protein of 9.2 g/dL.  Creatinine was normal at 0.9, calcium normal at 9.4, albumin 4.1. Iron studies, ferritin, B12, folate, serum copper , LDH were within normal limits.   SPEP showed no evidence of M spike.  IFE showed polyclonal increase detected in one or more immunoglobulins.  Serum free kappa was elevated at 118 mg/L, serum free lambda was elevated at 33.6 mg/L, ratio elevated at 3.54.  Quantitative IgG was increased at 3368 mg/dL, IgA was also slightly increased at 375 mg/dL and IgM was also increased at 245 mg/dL.  Overall picture is not consistent with monoclonal gammopathy but rather polyclonal increase in proteins from uncertain etiology.  Normal renal function and normal calcium.    No hematological intervention is warranted at current levels.   On 09/24/2023, ANA was positive with reflex  testing positive for anti-Ro and anti-La antibodies.  Rheumatoid factor was elevated at 266 IU/mL.  ESR was elevated at 123 mm/h.  CRP borderline at 1.  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  IFE showed polyclonal increase.  Both kappa and lambda were increased with ratio of 3.3, consistent with chronic disease picture.  No evidence of monoclonal gammopathy.  Referral sent to rheumatology for further evaluation of ANA positivity, positivity for anti-Ro and anti-La antibodies and elevated RF.  I have reviewed the past medical history, past surgical history, social history and family history with the patient and they are unchanged from previous note.  ALLERGIES: She is allergic to doxycycline,  amoxicillin-pot clavulanate, and lisinopril.  MEDICATIONS:  Current Outpatient Medications  Medication Sig Dispense Refill   carvedilol  (COREG ) 12.5 MG tablet Take 1 tablet (12.5 mg total) by mouth 2 (two) times daily with a meal. 180 tablet 3   ENTRESTO  24-26 MG Take 1 tablet by mouth 2 (two) times daily. 180 tablet 3   famotidine  (PEPCID ) 40 MG tablet Take 1 tablet (40 mg total) by mouth daily. 90 tablet 1   FARXIGA  10 MG TABS tablet Take 1 tablet (10 mg total) by mouth daily. 90 tablet 3   HYDROcodone -acetaminophen  (NORCO/VICODIN) 5-325 MG tablet Take 1 tablet by mouth every 8 (eight) hours as needed for moderate pain (pain score 4-6). 90 tablet 0   spironolactone  (ALDACTONE ) 25 MG tablet TAKE 1 TABLET(25 MG) BY MOUTH DAILY 30 tablet 0   No current facility-administered medications for this visit.     REVIEW OF SYSTEMS:    Review of Systems - Oncology  All other pertinent systems were reviewed with the patient and are negative.  PHYSICAL EXAMINATION:    Onc Performance Status - 05/24/24 1524       ECOG Perf Status   ECOG Perf Status Restricted in physically strenuous activity but ambulatory and able to carry out work of a light or sedentary nature, e.g., light house work, office work      KPS SCALE   KPS % SCORE Normal activity with effort, some s/s of disease            Vitals:   05/24/24 1514 05/24/24 1535  BP: (!) 193/76 (!) 193/82  Pulse: 74   Resp: 18   Temp: 98.4 F (36.9 C)   SpO2: 100%     Filed Weights   05/24/24 1514  Weight: 198 lb (89.8 kg)     Physical Exam Constitutional:      General: She is not in acute distress.    Appearance: Normal appearance.  HENT:     Head: Normocephalic and atraumatic.  Eyes:     General: No scleral icterus.    Conjunctiva/sclera: Conjunctivae normal.  Cardiovascular:     Rate and Rhythm: Normal rate and regular rhythm.     Heart sounds: Normal heart sounds.  Pulmonary:     Effort: Pulmonary effort is  normal.     Breath sounds: Normal breath sounds.  Chest:       Comments: Breast exam deferred today.  Last exam is as mentioned. Abdominal:     General: There is no distension.  Musculoskeletal:     Right lower leg: No edema.     Left lower leg: No edema.  Lymphadenopathy:     Cervical: No cervical adenopathy.  Neurological:     General: No focal deficit present.     Mental Status: She  is alert and oriented to person, place, and time.  Psychiatric:        Mood and Affect: Mood normal.        Behavior: Behavior normal.        Thought Content: Thought content normal.     LABORATORY DATA:   I have reviewed the data as listed.  Results for orders placed or performed in visit on 05/24/24  Lactate dehydrogenase  Result Value Ref Range   LDH 161 105 - 235 U/L  Iron and Iron Binding Capacity ([facility] only)  Result Value Ref Range   Iron 86 28 - 170 ug/dL   TIBC 680 749 - 549 ug/dL   Saturation Ratios 27 10.4 - 31.8 %   UIBC 233 ug/dL  Ferritin  Result Value Ref Range   Ferritin 267 11 - 307 ng/mL  CBC with Differential (Cancer Center Only)  Result Value Ref Range   WBC Count 3.3 (L) 4.0 - 10.5 K/uL   RBC 3.03 (L) 3.87 - 5.11 MIL/uL   Hemoglobin 9.0 (L) 12.0 - 15.0 g/dL   HCT 71.5 (L) 63.9 - 53.9 %   MCV 93.7 80.0 - 100.0 fL   MCH 29.7 26.0 - 34.0 pg   MCHC 31.7 30.0 - 36.0 g/dL   RDW 80.8 (H) 88.4 - 84.4 %   Platelet Count 164 150 - 400 K/uL   nRBC 0.6 (H) 0.0 - 0.2 %   Neutrophils Relative % 51 %   Neutro Abs 1.7 1.7 - 7.7 K/uL   Lymphocytes Relative 38 %   Lymphs Abs 1.3 0.7 - 4.0 K/uL   Monocytes Relative 5 %   Monocytes Absolute 0.2 0.1 - 1.0 K/uL   Eosinophils Relative 0 %   Eosinophils Absolute 0.0 0.0 - 0.5 K/uL   Basophils Relative 0 %   Basophils Absolute 0.0 0.0 - 0.1 K/uL   WBC Morphology See Note    Smear Review Normal platelet morphology    Immature Granulocytes 6 %   Abs Immature Granulocytes 0.20 (H) 0.00 - 0.07 K/uL   Polychromasia PRESENT     Target Cells PRESENT    Ovalocytes PRESENT   Lactate dehydrogenase  Result Value Ref Range   LDH 164 105 - 235 U/L  Ferritin  Result Value Ref Range   Ferritin 266 11 - 307 ng/mL      RADIOGRAPHIC STUDIES:  US  LIMITED ULTRASOUND INCLUDING AXILLA RIGHT BREAST CLINICAL DATA:  Patient with history of RIGHT breast cancer status post lumpectomy and radiation in 2021. Patient underwent ultrasound-guided aspiration of a complicated fluid collection in the RIGHT breast on March 17, 2024. It demonstrated predominately white blood cells, benign results without bacterial growth. Patient presents for follow-up. She reports improvement in symptomatology  EXAM: ULTRASOUND OF THE RIGHT BREAST  COMPARISON:  Previous exams  FINDINGS: On physical exam, there is a well-healed surgical scar in the upper breast  Targeted ultrasound was performed of the area of concern in the RIGHT upper breast. At 12 o'clock 5 cm from nipple there is revisualization of a superficial predominately anechoic oval mass. It measures 13 x 10 x 8 mm, previously 18 x 8 by 13 mm.  IMPRESSION: Mild decrease in size of a complicated RIGHT breast fluid collection status post aspiration. This may reflect a seroma given negative culture results.  Repeat aspiration was offered to patient for symptomatic relief. Patient reports improvement in symptomatology and defers aspiration at this point in time.  RECOMMENDATION: Recommend bilateral diagnostic mammogram (with RIGHT  and LEFT breast ultrasound if deemed necessary) in January 2026 given history of malignancy. Patient was instructed to return sooner should she have intervening clinical concerns or worsening pain.  I have discussed the findings and recommendations with the patient. If applicable, a reminder letter will be sent to the patient regarding the next appointment.  BI-RADS CATEGORY  2: Benign.  Electronically Signed   By: Corean Salter M.D.    On: 03/27/2024 15:13    Orders Placed This Encounter  Procedures   MM 3D DIAGNOSTIC MAMMOGRAM BILATERAL BREAST    Standing Status:   Future    Expected Date:   06/12/2024    Expiration Date:   05/24/2025    Reason for Exam (SYMPTOM  OR DIAGNOSIS REQUIRED):   Hx of stage 2 right breast cancer, now with seroma/ abscess and non-specific findings. Needs follow up as per radiaology    Preferred imaging location?:   [facility]   CBC with Differential ([facility]cer Center Only)    Standing Status:   Future    Expiration Date:   05/24/2025   CMP (Cancer Center only)    Standing Status:   Future    Expiration Date:   05/24/2025   Iron and Iron Binding Capacity ([facility] only)    Standing Status:   Future    Expiration Date:   05/24/2025   Ferritin    Standing Status:   Future    Expiration Date:   05/24/2025   Ambulatory referral to Rheumatology    Referral Priority:   Routine    Referral Type:   Consultation    Referral Reason:   Specialty Services Required    Requested Specialty:   Rheumatology    Number of Visits Requested:   1     Future Appointments  Date Time Provider Department Center  06/05/2024  1:00 PM Armbruster, Elspeth SQUIBB, MD [facility]  07/03/2024  2:40 PM Urbano Albright, MD [facility]  07/27/2024 12:50 PM Cheryl Waddell HERO, PA-C [facility] None  08/23/2024  2:15 PM [facility] None  08/23/2024  2:45 PM Laurin Morgenstern, Chinita, MD [facility] None     This document was completed utilizing speech recognition software. Grammatical errors, random word insertions, pronoun errors, and incomplete sentences are an occasional consequence of this system due to software limitations, ambient noise, and hardware issues. Any formal questions or concerns about the content, text or information contained within the body of this dictation should be directly addressed to the provider for clarification.  "

## 2024-05-24 NOTE — Assessment & Plan Note (Addendum)
 Diagnosed in November 2021 and managed at [place], [place].  ER negative, PR negative, HER2 positive.  Originally plan was to treat her with [facility] regimen every 3 weeks for 6 cycles as part of neoadjuvant treatment.     C1 HP only due to poor PS. 05/16/20 added weekly Taxol. (THP)  She had Herceptin 5/13 and then was admitted with CHF [facility]) prior to her next treatment   She ended up having lumpectomy 11/2020 - 6 foci of IDC, largest 4mm but did have LVI and 2/18 nodes  12/2020 re-excision of positive margin   Completed 5 cycles Kadcyla 06/20/2021 - stopped due to low PLT   HER2 positive status has a slightly higher recurrence risk  Reports right breast pain and a palpable hard area, distinct from usual post-surgical puffiness, present for two weeks. Located above the scar tissue from previous breast cancer surgery in November 2021.   Last mammogram in January 2025 was normal.   Since she had new symptoms in the right breast with swelling, pain and puffiness, we obtained diagnostic mammogram and ultrasound on 03/15/2024.  It showed nonspecific area at 12 o'clock position of right breast, measuring 1.8 cm x 1.2 cm.  Ultrasound-guided aspiration of fluid collection was obtained.  BI-RADS 3, probably benign.  Biopsy indicated benign findings/abscess. She was prescribed Bactrim  but patient developed severe fatigue and generalized malaise after starting Bactrim .  She was advised to hold that.  Repeat ultrasound of the axilla on 03/27/2024 showed BI-RADS 2, benign findings with decreased size of the complicated right breast fluid collection status post aspiration.  This may reflect a seroma.  Recommendation was for a diagnostic mammogram bilaterally in January 2026.  Request placed for this today.  I will plan to see her approximately in 3 months for follow-up.

## 2024-05-25 LAB — METHYLMALONIC ACID, SERUM: Methylmalonic Acid, Quantitative: 104 nmol/L (ref 0–378)

## 2024-05-25 LAB — HAPTOGLOBIN: Haptoglobin: 137 mg/dL (ref 37–355)

## 2024-05-26 ENCOUNTER — Encounter: Payer: Self-pay | Admitting: Gastroenterology

## 2024-05-29 ENCOUNTER — Other Ambulatory Visit: Payer: Self-pay | Admitting: Oncology

## 2024-05-29 DIAGNOSIS — C50911 Malignant neoplasm of unspecified site of right female breast: Secondary | ICD-10-CM

## 2024-06-05 ENCOUNTER — Telehealth: Payer: Self-pay | Admitting: Gastroenterology

## 2024-06-05 ENCOUNTER — Encounter: Admitting: Gastroenterology

## 2024-06-05 NOTE — Telephone Encounter (Signed)
 I understand emergencies happen and she will be excused for this absence.  However, she also no showed her last colonoscopy appointment in Jan 2025. She will be given one more opportunity to schedule colonoscopy at the [facility] at her convenience. If she does not show up for her next exam she may not be able to reschedule in the future.

## 2024-06-05 NOTE — Telephone Encounter (Signed)
 Good Morning Dr.Armbruster,  Patient calling stating she is going to need to cancel today's colonoscopy due to a family emergency, patient has rescheduled for 06/29/24.  Please advise  Thank you

## 2024-06-21 ENCOUNTER — Other Ambulatory Visit

## 2024-06-21 ENCOUNTER — Inpatient Hospital Stay: Admission: RE | Admit: 2024-06-21 | Source: Ambulatory Visit

## 2024-06-29 ENCOUNTER — Encounter: Admitting: Gastroenterology

## 2024-07-03 ENCOUNTER — Encounter: Admitting: Physical Medicine & Rehabilitation

## 2024-07-12 ENCOUNTER — Other Ambulatory Visit

## 2024-07-12 ENCOUNTER — Encounter

## 2024-07-27 ENCOUNTER — Ambulatory Visit: Admitting: Physician Assistant

## 2024-08-23 ENCOUNTER — Inpatient Hospital Stay: Admitting: Oncology

## 2024-08-23 ENCOUNTER — Inpatient Hospital Stay: Attending: Oncology
